# Patient Record
Sex: Male | Born: 1950
Health system: Southern US, Community
[De-identification: ages and names within clinical notes are randomized; demographics above are authoritative.]

## PROBLEM LIST (undated history)

## (undated) ENCOUNTER — Ambulatory Visit: Payer: MEDICARE

## (undated) ENCOUNTER — Ambulatory Visit

## (undated) ENCOUNTER — Encounter

## (undated) ENCOUNTER — Telehealth: Attending: Hematology & Oncology | Primary: Hematology & Oncology

## (undated) ENCOUNTER — Ambulatory Visit: Payer: MEDICARE | Attending: Hematology & Oncology | Primary: Hematology & Oncology

## (undated) ENCOUNTER — Encounter: Attending: Hematology & Oncology | Primary: Hematology & Oncology

## (undated) ENCOUNTER — Telehealth
Attending: Student in an Organized Health Care Education/Training Program | Primary: Student in an Organized Health Care Education/Training Program

## (undated) ENCOUNTER — Telehealth

## (undated) ENCOUNTER — Ambulatory Visit
Payer: MEDICARE | Attending: Rehabilitative and Restorative Service Providers" | Primary: Rehabilitative and Restorative Service Providers"

## (undated) ENCOUNTER — Ambulatory Visit: Payer: MEDICARE | Attending: Diagnostic Radiology | Primary: Diagnostic Radiology

## (undated) ENCOUNTER — Ambulatory Visit
Payer: MEDICARE | Attending: Student in an Organized Health Care Education/Training Program | Primary: Student in an Organized Health Care Education/Training Program

## (undated) ENCOUNTER — Encounter: Attending: Radiation Oncology | Primary: Radiation Oncology

## (undated) DIAGNOSIS — Z9181 History of falling: Secondary | ICD-10-CM

## (undated) DIAGNOSIS — C61 Malignant neoplasm of prostate: Secondary | ICD-10-CM

## (undated) DIAGNOSIS — I10 Essential (primary) hypertension: Secondary | ICD-10-CM

## (undated) DIAGNOSIS — F329 Major depressive disorder, single episode, unspecified: Secondary | ICD-10-CM

## (undated) DIAGNOSIS — F32A Depression, unspecified: Secondary | ICD-10-CM

## (undated) HISTORY — DX: Malignant neoplasm of prostate: C61

## (undated) HISTORY — DX: Essential (primary) hypertension: I10

---

## 1996-07-23 HISTORY — PX: OTHER SURGICAL HISTORY: SHX169

## 1999-07-24 HISTORY — PX: BACK SURGERY: SHX140

## 2013-10-28 ENCOUNTER — Encounter: Payer: Self-pay | Admitting: Internal Medicine

## 2013-12-21 ENCOUNTER — Ambulatory Visit: Payer: Self-pay | Admitting: Internal Medicine

## 2017-05-29 ENCOUNTER — Other Ambulatory Visit: Payer: Self-pay

## 2017-05-29 DIAGNOSIS — F17200 Nicotine dependence, unspecified, uncomplicated: Secondary | ICD-10-CM | POA: Insufficient documentation

## 2017-05-29 DIAGNOSIS — I10 Essential (primary) hypertension: Secondary | ICD-10-CM | POA: Insufficient documentation

## 2017-05-29 DIAGNOSIS — M545 Low back pain: Secondary | ICD-10-CM | POA: Insufficient documentation

## 2017-05-29 DIAGNOSIS — N390 Urinary tract infection, site not specified: Secondary | ICD-10-CM | POA: Insufficient documentation

## 2017-05-29 DIAGNOSIS — R42 Dizziness and giddiness: Secondary | ICD-10-CM | POA: Insufficient documentation

## 2017-05-29 LAB — URINALYSIS, COMPLETE (UACMP) WITH MICROSCOPIC
BACTERIA UA: NONE SEEN
Bilirubin Urine: NEGATIVE
Glucose, UA: NEGATIVE mg/dL
KETONES UR: NEGATIVE mg/dL
Nitrite: NEGATIVE
PH: 5 (ref 5.0–8.0)
PROTEIN: 30 mg/dL — AB
Specific Gravity, Urine: 1.02 (ref 1.005–1.030)

## 2017-05-29 LAB — CBC
HEMATOCRIT: 40.7 % (ref 40.0–52.0)
HEMOGLOBIN: 13.6 g/dL (ref 13.0–18.0)
MCH: 33.2 pg (ref 26.0–34.0)
MCHC: 33.5 g/dL (ref 32.0–36.0)
MCV: 98.8 fL (ref 80.0–100.0)
Platelets: 150 10*3/uL (ref 150–440)
RBC: 4.11 MIL/uL — AB (ref 4.40–5.90)
RDW: 14.1 % (ref 11.5–14.5)
WBC: 6 10*3/uL (ref 3.8–10.6)

## 2017-05-29 LAB — BASIC METABOLIC PANEL
Anion gap: 10 (ref 5–15)
BUN: 25 mg/dL — ABNORMAL HIGH (ref 6–20)
CALCIUM: 8.9 mg/dL (ref 8.9–10.3)
CHLORIDE: 105 mmol/L (ref 101–111)
CO2: 24 mmol/L (ref 22–32)
Creatinine, Ser: 1.28 mg/dL — ABNORMAL HIGH (ref 0.61–1.24)
GFR calc non Af Amer: 58 mL/min — ABNORMAL LOW (ref >60)
GLUCOSE: 98 mg/dL (ref 65–99)
POTASSIUM: 3.9 mmol/L (ref 3.5–5.1)
Sodium: 139 mmol/L (ref 135–145)

## 2017-05-29 LAB — TROPONIN I: Troponin I: <0.03 ng/mL (ref <0.03)

## 2017-05-29 LAB — GLUCOSE, CAPILLARY: GLUCOSE-CAPILLARY: 91 mg/dL (ref 65–99)

## 2017-05-29 NOTE — ED Triage Notes (Signed)
Pt presents to ED via POV with multiple medical c/o's. Pt reports dizziness "for the last 2 or 3 weeks", excessive nocturia, and issues with chronic back pain. Pt denies N/V/D or fever, denies CP or SHOB. Pt reports using "alcohol to self-medicate". Pt denies unilateral weakness; no facial droop noted. Pt is A&O, in NAD; RR even, regular, and unlabored; skin color/temp is WNL.

## 2017-05-30 ENCOUNTER — Emergency Department
Admission: EM | Admit: 2017-05-30 | Discharge: 2017-05-30 | Disposition: A | Discharge status: Home / Self Care | Payer: Self-pay | Attending: Emergency Medicine | Admitting: Emergency Medicine

## 2017-05-30 ENCOUNTER — Emergency Department: Payer: Self-pay

## 2017-05-30 DIAGNOSIS — R42 Dizziness and giddiness: Secondary | ICD-10-CM

## 2017-05-30 DIAGNOSIS — I10 Essential (primary) hypertension: Secondary | ICD-10-CM

## 2017-05-30 DIAGNOSIS — N390 Urinary tract infection, site not specified: Secondary | ICD-10-CM

## 2017-05-30 LAB — SALICYLATE LEVEL: Salicylate Lvl: <7 mg/dL (ref 2.8–30.0)

## 2017-05-30 LAB — ETHANOL

## 2017-05-30 LAB — ACETAMINOPHEN LEVEL: Acetaminophen (Tylenol), Serum: <10 ug/mL — ABNORMAL LOW (ref 10–30)

## 2017-05-30 IMAGING — CT CT HEAD W/O CM
3 series · 16 of 44 positions shown, 19 images · non-contrast
Comparison: None.

CLINICAL DATA: Dizziness

EXAM:
CT HEAD WITHOUT CONTRAST
TECHNIQUE: Contiguous axial images were obtained from the base of the skull
through the vertex without intravenous contrast.

[Series 3: head wo · axial · 0.40mm/px · z∈[-73,+37]mm · 10 of 27 slices shown, 13 images]
[im 3/27  brain]
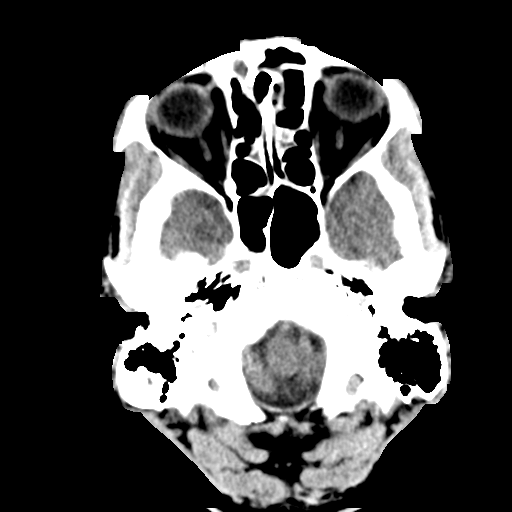
[im 3/27  bone]
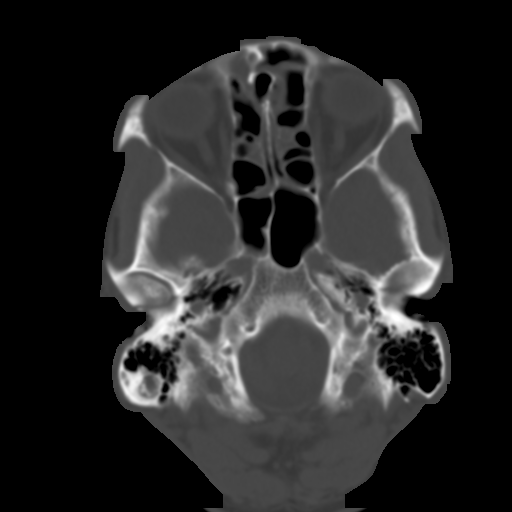
[im 5/27  brain]
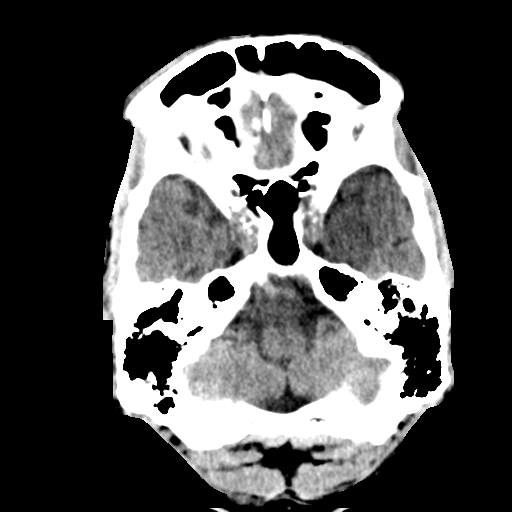
[im 8/27  brain]
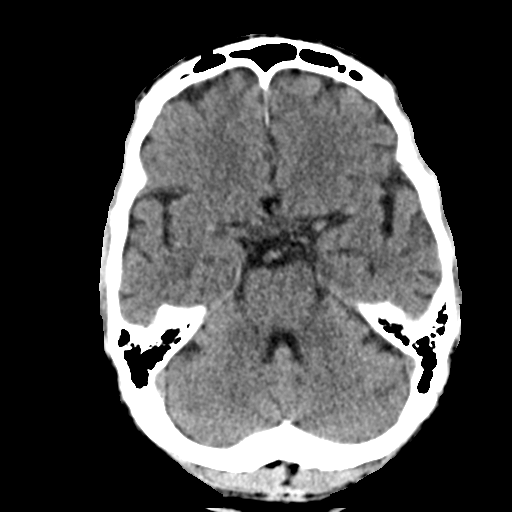
[im 10/27  brain]
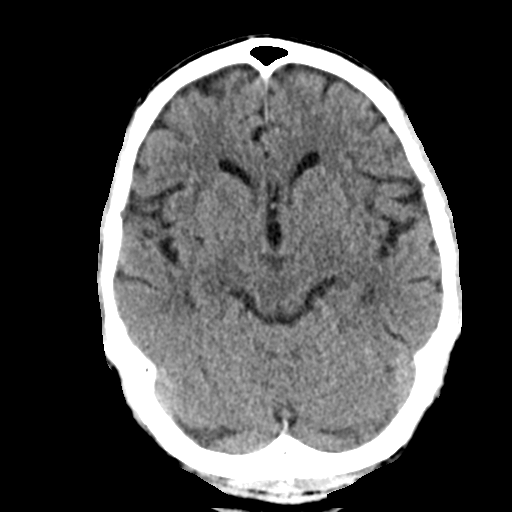
[im 13/27  brain]
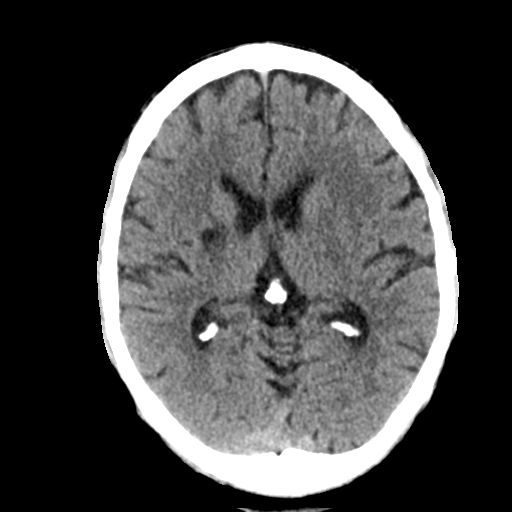
[im 13/27  bone]
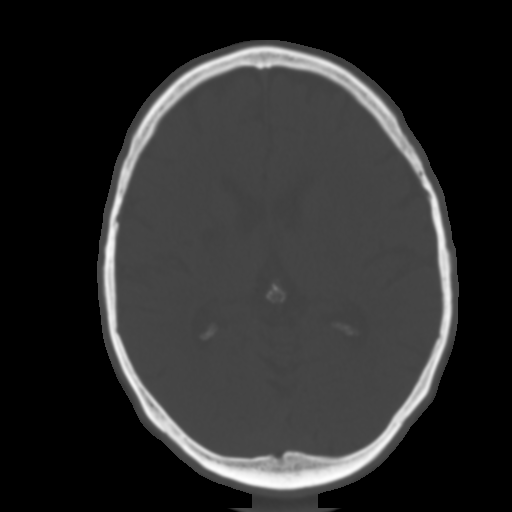
[im 15/27  brain]
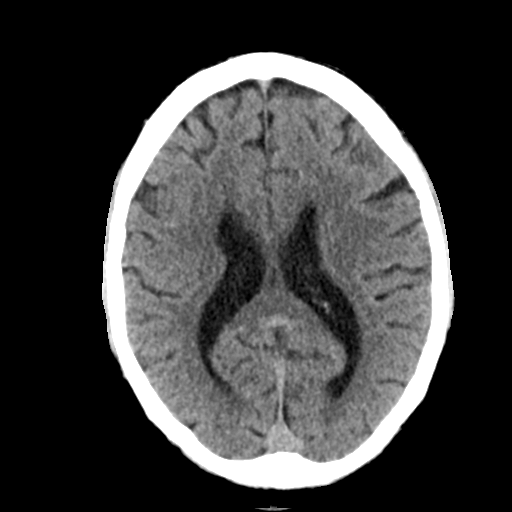
[im 18/27  brain]
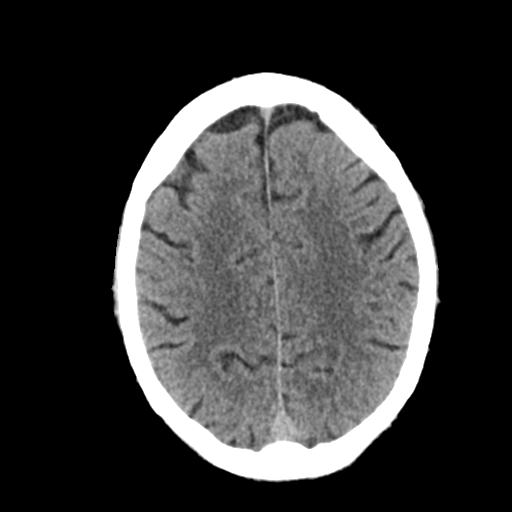
[im 20/27  brain]
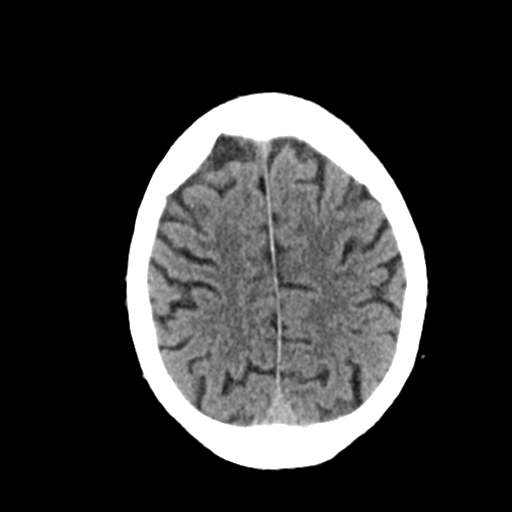
[im 23/27  brain]
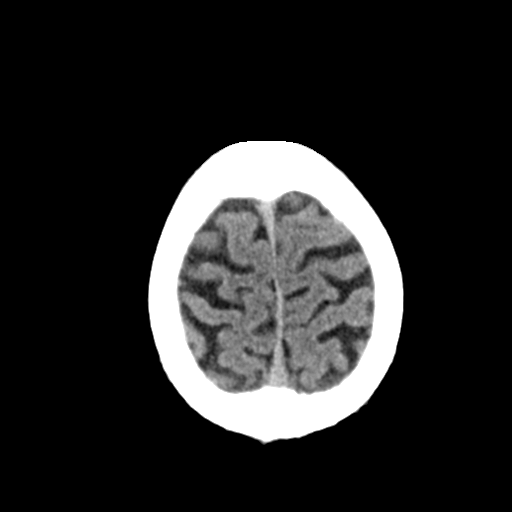
[im 23/27  bone]
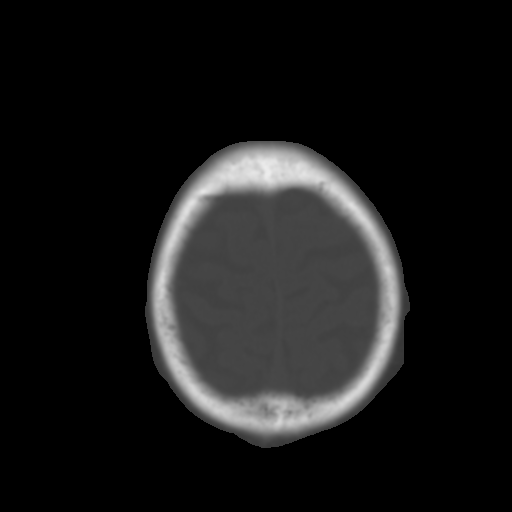
[im 25/27  brain]
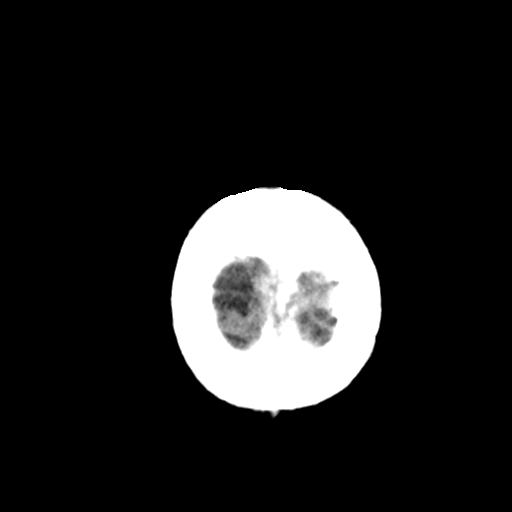

[Series 4: coronal soft tissue · coronal · 0.30mm/px · 3 of 67 slices shown]
[im 23/67  brain]
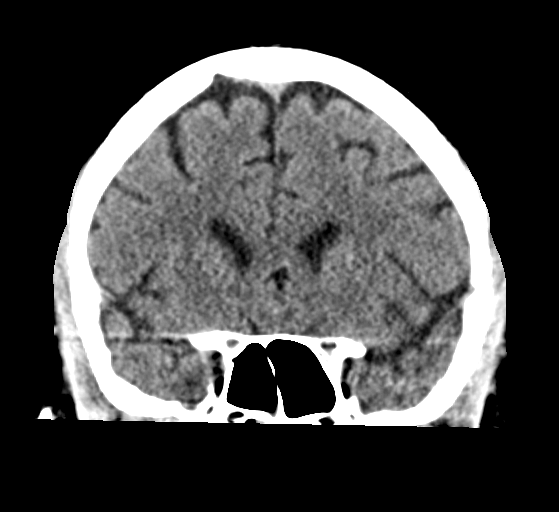
[im 30/67  brain]
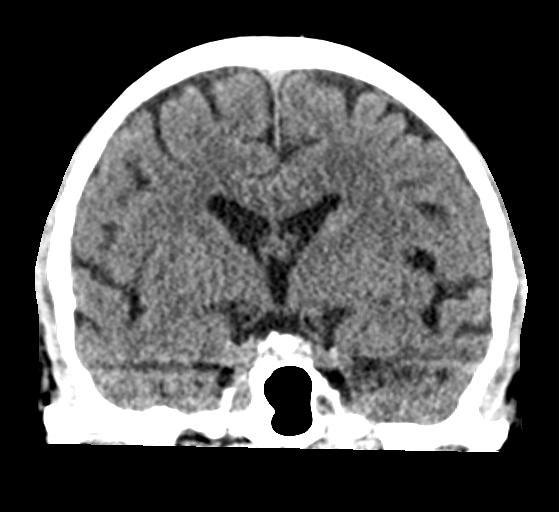
[im 37/67  brain]
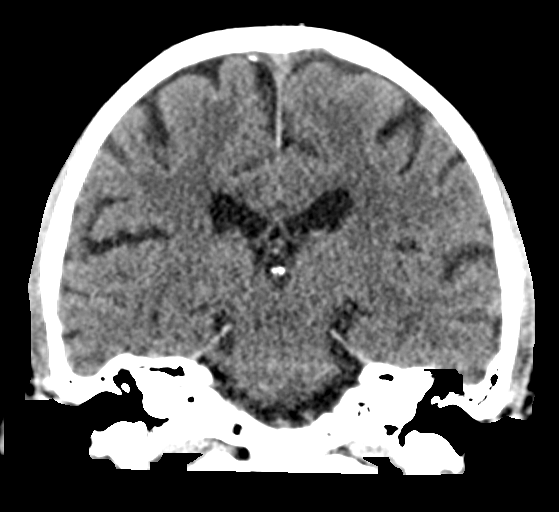

[Series 5: sagittal soft tissue · sagittal · 0.30mm/px · 3 of 51 slices shown]
[im 17/51  brain]
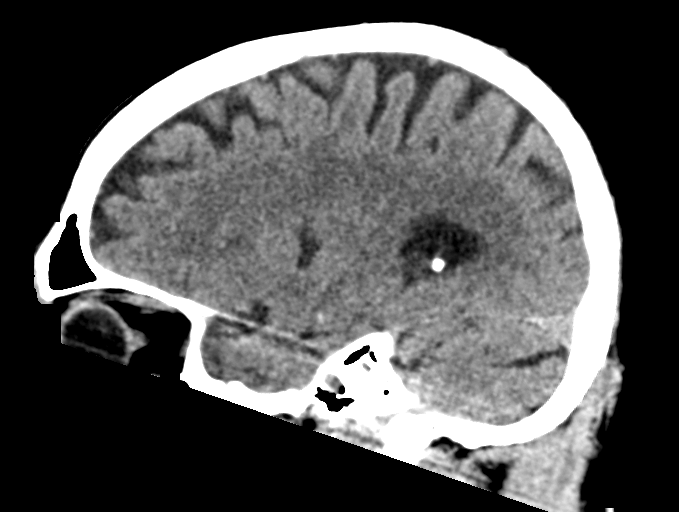
[im 26/51  brain]
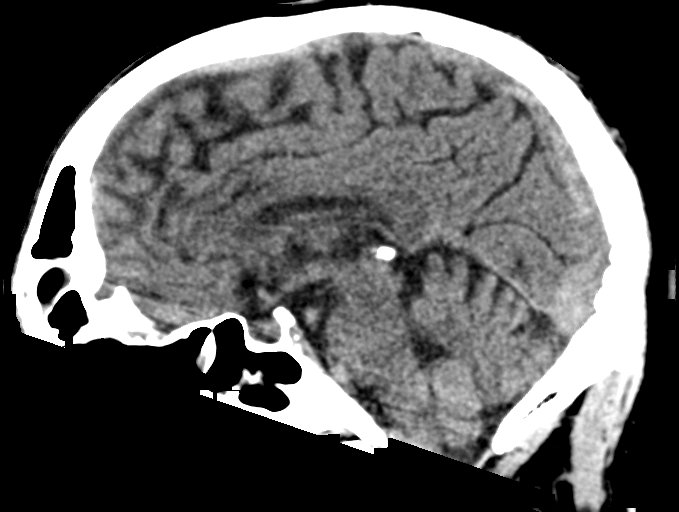
[im 34/51  brain]
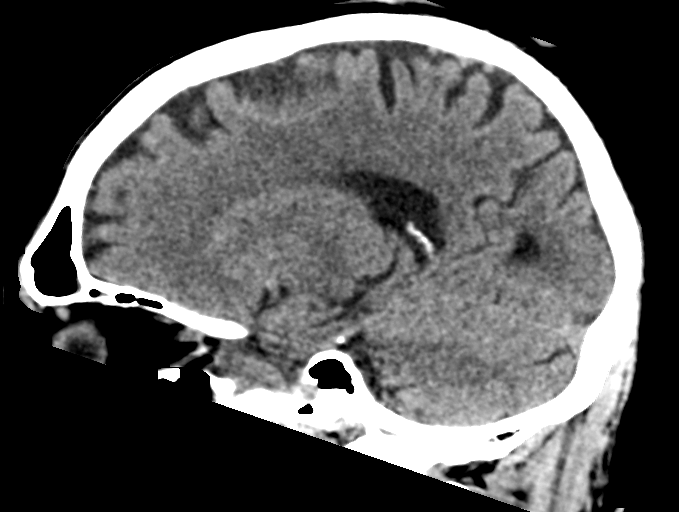

[16 of 44 positions shown; findings below may reference images not displayed]

FINDINGS: Brain: No acute territorial infarction, hemorrhage or intracranial
mass is visualized. Old right basal ganglia infarct. Normal
ventricle size

Vascular: No hyperdense vessels. Scattered calcifications at the
carotid siphon

Skull: No fracture

Sinuses/Orbits: Mucosal opacification of the ethmoid and frontal
sinuses. No acute orbital abnormality

Other: None
IMPRESSION: 1. No CT evidence for acute intracranial abnormality.
2. Sinus disease

## 2017-05-30 MED ORDER — HYDROCHLOROTHIAZIDE 25 MG PO TABS: 25.0000 mg | ORAL_TABLET | Freq: Every day | ORAL | 0 refills | Status: DC

## 2017-05-30 MED ORDER — MECLIZINE HCL 25 MG PO TABS: 25.0000 mg | ORAL_TABLET | Freq: Three times a day (TID) | ORAL | 0 refills | Status: DC | PRN

## 2017-05-30 MED ORDER — CIPROFLOXACIN HCL 500 MG PO TABS: 500.0000 mg | ORAL_TABLET | Freq: Two times a day (BID) | ORAL | 0 refills | Status: DC

## 2017-05-30 MED ORDER — CIPROFLOXACIN HCL 500 MG PO TABS
500.0000 mg | ORAL_TABLET | Freq: Once | ORAL | Status: AC
  Administered 2017-05-30: 500 mg via ORAL
  Filled 2017-05-30: qty 1

## 2017-05-30 MED ORDER — ONDANSETRON 4 MG PO TBDP: 4.0000 mg | ORAL_TABLET | Freq: Three times a day (TID) | ORAL | 0 refills | Status: DC | PRN

## 2017-05-30 MED ORDER — ONDANSETRON 4 MG PO TBDP
4.0000 mg | ORAL_TABLET | Freq: Once | ORAL | Status: AC
  Administered 2017-05-30: 4 mg via ORAL
  Filled 2017-05-30: qty 1

## 2017-05-30 MED ORDER — MECLIZINE HCL 25 MG PO TABS
25.0000 mg | ORAL_TABLET | Freq: Once | ORAL | Status: AC
  Administered 2017-05-30: 25 mg via ORAL
  Filled 2017-05-30: qty 1

## 2017-05-30 NOTE — Discharge Instructions (Signed)
1.  Take antibiotic as prescribed (Cipro 500 mg twice daily x 7 days). 2.  You may take medicines as needed for dizziness and nausea (Meclizine/Zofran #20). 3.  Return to the ER for worsening symptoms, persistent vomiting, difficulty breathing or other concerns.

## 2017-05-30 NOTE — ED Notes (Signed)
Patient transported to CT 

## 2017-05-30 NOTE — ED Provider Notes (Signed)
[facility] Emergency Department Provider Note   ____________________________________________   First MD Initiated Contact with Patient 05/30/17 0129     (approximate)  I have reviewed the triage vital signs and the nursing notes.   HISTORY  Chief Complaint Dizziness and Back Pain    HPI Terry Mitchell is a 66 y.o. male who presents to the ED from home with a chief complaint of dizziness and low back pain.  Patient reports dizziness for the past 2 or 3 weeks.  Describes a sensation of room spinning, worsened on movement or head positioning.  Denies associated headache, vision changes, lightheaded feeling, chest pain, shortness of breath, nausea or vomiting.  Denies slurred speech, extremity weakness, facial droop.  States he hit his top of his head on a dumpster last week.  He did not suffer LOC.  Denies daily drinking but states he drinks often.  Also complains of nocturia and chronic back pain.  Denies recent travel.   Past medical history None  There are no active problems to display for this patient.   Past Surgical History:  Procedure Laterality Date  . BACK SURGERY      Prior to Admission medications   Not on File    Allergies Penicillins  No family history on file.  Social History Social History   Tobacco Use  . Smoking status: Current Every Day Smoker  . Smokeless tobacco: Never Used  Substance Use Topics  . Alcohol use: Yes  . Drug use: No    Review of Systems  Constitutional: No fever/chills. Eyes: No visual changes. ENT: No sore throat. Cardiovascular: Denies chest pain. Respiratory: Denies shortness of breath. Gastrointestinal: No abdominal pain.  No nausea, no vomiting.  No diarrhea.  No constipation. Genitourinary: Positive for nocturia.  Negative for dysuria. Musculoskeletal: Positive for back pain. Skin: Negative for rash. Neurological: Positive for dizziness.  Negative for headaches, focal weakness or  numbness.   ____________________________________________   PHYSICAL EXAM:  VITAL SIGNS: ED Triage Vitals  Enc Vitals Group     BP 05/29/17 2100 (!) 142/72     Pulse Rate 05/29/17 2100 66     Resp 05/29/17 2100 17     Temp 05/29/17 2100 99 F (37.2 C)     Temp Source 05/29/17 2100 Oral     SpO2 05/29/17 2100 96 %     Weight 05/29/17 2059 175 lb (79.4 kg)     Height 05/29/17 2059 6' (1.829 m)     Head Circumference --      Peak Flow --      Pain Score 05/29/17 2058 5     Pain Loc --      Pain Edu? --      Excl. in [place]? --     Constitutional: Alert and oriented. Well appearing and in no acute distress. Eyes: Conjunctivae are normal. PERRL. EOMI. Head: Atraumatic. Ears: Mild cerumen bilaterally, otherwise within normal limits. Nose: No congestion/rhinnorhea. Mouth/Throat: Mucous membranes are moist.  Oropharynx non-erythematous. Neck: No stridor.  No carotid bruits. Cardiovascular: Normal rate, regular rhythm. Grossly normal heart sounds.  Good peripheral circulation. Respiratory: Normal respiratory effort.  No retractions. Lungs CTAB. Gastrointestinal: Soft and nontender. No distention. No abdominal bruits. No CVA tenderness. Musculoskeletal: No lower extremity tenderness nor edema.  No joint effusions. Neurologic:  Normal speech and language. No gross focal neurologic deficits are appreciated. No gait instability. Skin:  Skin is warm, dry and intact. No rash noted. Psychiatric: Mood and affect are  normal. Speech and behavior are normal.  ____________________________________________   LABS (all labs ordered are listed, but only abnormal results are displayed)  Labs Reviewed  BASIC METABOLIC PANEL - Abnormal; Notable for the following components:      Result Value   BUN 25 (*)    Creatinine, Ser 1.28 (*)    GFR calc non Af Amer 58 (*)    All other components within normal limits  CBC - Abnormal; Notable for the following components:   RBC 4.11 (*)    All other  components within normal limits  URINALYSIS, COMPLETE (UACMP) WITH MICROSCOPIC - Abnormal; Notable for the following components:   Color, Urine YELLOW (*)    APPearance CLEAR (*)    Hgb urine dipstick SMALL (*)    Protein, ur 30 (*)    Leukocytes, UA TRACE (*)    Squamous Epithelial / LPF 0-5 (*)    All other components within normal limits  ACETAMINOPHEN LEVEL - Abnormal; Notable for the following components:   Acetaminophen (Tylenol), Serum <10 (*)    All other components within normal limits  TROPONIN I  ETHANOL  GLUCOSE, CAPILLARY  SALICYLATE LEVEL  CBG MONITORING, ED   ____________________________________________  EKG  ED ECG REPORT I, Diannah Rindfleisch J, the attending physician, personally viewed and interpreted this ECG.   Date: 05/30/2017  EKG Time: 2105  Rate: 61  Rhythm: normal EKG, normal sinus rhythm  Axis: Normal  Intervals:none  ST&T Change: Nonspecific  ____________________________________________  RADIOLOGY  Ct Head Wo Contrast  Result Date: 05/30/2017 CLINICAL DATA:  Dizziness EXAM: CT HEAD WITHOUT CONTRAST TECHNIQUE: Contiguous axial images were obtained from the base of the skull through the vertex without intravenous contrast. COMPARISON:  None. FINDINGS: Brain: No acute territorial infarction, hemorrhage or intracranial mass is visualized. Old right basal ganglia infarct. Normal ventricle size Vascular: No hyperdense vessels. Scattered calcifications at the carotid siphon Skull: No fracture Sinuses/Orbits: Mucosal opacification of the ethmoid and frontal sinuses. No acute orbital abnormality Other: None IMPRESSION: 1. No CT evidence for acute intracranial abnormality. 2. Sinus disease Electronically Signed   By: Donavan Foil M.D.   On: 05/30/2017 02:22    ____________________________________________   PROCEDURES  Procedure(s) performed: None  Procedures  Critical Care performed: No  ____________________________________________   INITIAL  IMPRESSION / ASSESSMENT AND PLAN / ED COURSE  As part of my medical decision making, I reviewed the following data within the [place] notes reviewed and incorporated, Labs reviewed, EKG interpreted, Radiograph reviewed  and Notes from prior ED visits.   66 year old male with no past medical history who presents with 2-3 weeks history of dizziness.  No focal neurological deficits on examination.  Differential diagnosis includes but is not limited to vertigo, Mnire's disease, TIA, CVA, encephalopathy, alcohol induced Warnicke syndrome.  Laboratory and urinalysis results remarkable for mild renal insufficiency, UTI.  Pending results of EtOH, acetaminophen and salicylate.  Will obtain CT head.  Administer meclizine and Zofran; will reassess.  Clinical Course as of May 30 345  Thu May 30, 2017  0343 Patient resting no acute distress.  Overall improved.  Updated patient of CT results.  Will place on Cipro for UTI, meclizine and Zofran to use as needed.  Will start low-dose hctz and patient will follow up closely with his PCP.  Strict return precautions given.  Patient verbalizes understanding and agrees with plan of care.  [JS]    Clinical Course User Index [JS] Paulette Blanch, MD  ____________________________________________   FINAL CLINICAL IMPRESSION(S) / ED DIAGNOSES  Final diagnoses:  Dizziness  Lower urinary tract infectious disease  Essential hypertension     ED Discharge Orders    None       Note:  This document was prepared using Dragon voice recognition software and may include unintentional dictation errors.    Paulette Blanch, MD 05/30/17 [phone]

## 2017-12-21 DIAGNOSIS — C61 Malignant neoplasm of prostate: Secondary | ICD-10-CM

## 2017-12-21 HISTORY — DX: Malignant neoplasm of prostate: C61

## 2017-12-31 NOTE — Progress Notes (Signed)
01/01/2018 3:07 PM   Terry Mitchell 22, 1952 [id number]  Referring provider: No referring provider defined for this encounter.  Chief Complaint  Patient presents with  . Elevated PSA    New Patient    HPI: Patient is a 67 year old African American male who is referred to [place] by Dr. Kym Mitchell for a PSA of 72.  He is having frequency, urgency, nocturia, incontinence, intermittency and a weak urinary stream.  He is also having ED.  Patient denies any gross hematuria, dysuria or suprapubic/flank pain.  Patient denies any fevers, chills, nausea or vomiting.   He states that he has had no prior PSAs or DRE's.  He has not seen an urologist in the past.  His father and uncles died when they were young.  He states he has no brothers.  He states a first cousin was diagnosed with prostate cancer at age 53.  His UA today is negative.  His PVR is 0 mL.     PMH: History reviewed. No pertinent past medical history.  Surgical History: Past Surgical History:  Procedure Laterality Date  . BACK SURGERY      Home Medications:  Allergies as of 01/01/2018      Reactions   Penicillins Hives      Medication List        Accurate as of 01/01/18 11:59 PM. Always use your most recent med list.          losartan 100 MG tablet Commonly known as:  COZAAR Take 100 mg by mouth daily.   predniSONE 10 MG tablet Commonly known as:  DELTASONE Take 10 mg by mouth daily with breakfast.       Allergies:  Allergies  Allergen Reactions  . Penicillins Hives    Family History: History reviewed. No pertinent family history.  Social History:  reports that he has been smoking.  He has never used smokeless tobacco. He reports that he drinks alcohol. He reports that he does not use drugs.  ROS: UROLOGY Frequent Urination?: Yes Hard to postpone urination?: Yes Burning/pain with urination?: No Get up at night to urinate?: Yes Leakage of urine?: Yes Urine stream starts and stops?:  Yes Trouble starting stream?: No Do you have to strain to urinate?: No Blood in urine?: No Urinary tract infection?: No Sexually transmitted disease?: No Injury to kidneys or bladder?: No Painful intercourse?: No Weak stream?: No Erection problems?: Yes Penile pain?: No  Gastrointestinal Nausea?: No Vomiting?: No Indigestion/heartburn?: Yes Diarrhea?: Yes Constipation?: No  Constitutional Fever: No Night sweats?: Yes Weight loss?: No Fatigue?: No  Skin Skin rash/lesions?: No Itching?: No  Eyes Blurred vision?: Yes Double vision?: No  Ears/Nose/Throat Sore throat?: No Sinus problems?: Yes  Hematologic/Lymphatic Swollen glands?: No Easy bruising?: No  Cardiovascular Leg swelling?: Yes Chest pain?: Yes  Respiratory Cough?: Yes Shortness of breath?: No  Endocrine Excessive thirst?: No  Musculoskeletal Back pain?: Yes Joint pain?: Yes  Neurological Headaches?: No Dizziness?: No  Psychologic Depression?: Yes Anxiety?: Yes  Physical Exam: BP (!) 152/79   Pulse 65   Resp 16   Ht 6' (1.829 m)   Wt 192 lb 3.2 oz (87.2 kg)   SpO2 96%   BMI 26.07 kg/m   Constitutional:  Well nourished. Alert and oriented, No acute distress. HEENT: [place] AT, moist mucus membranes.  Trachea midline, no masses. Cardiovascular: No clubbing, cyanosis, or edema. Respiratory: Normal respiratory effort, no increased work of breathing. GI: Abdomen is soft, non tender, non distended, no  abdominal masses. Liver and spleen not palpable.  No hernias appreciated.  Stool sample for occult testing is not indicated.   GU: No CVA tenderness.  No bladder fullness or masses.  Patient with circumcised phallus.   Foreskin easily retracted Urethral meatus is patent.  No penile discharge. No penile lesions or rashes. Scrotum without lesions, cysts, rashes and/or edema.  Testicles are located scrotally bilaterally. No masses are appreciated in the testicles. Left and right epididymis are  normal. Rectal: Patient with  normal sphincter tone. Anus and perineum without scarring or rashes. No rectal masses are appreciated. Prostate is approximately 60 grams, nodular.   Seminal vesicles are normal. Skin: No rashes, bruises or suspicious lesions. Lymph: No cervical or inguinal adenopathy. Neurologic: Grossly intact, no focal deficits, moving all 4 extremities. Psychiatric: Normal mood and affect.  Laboratory Data: Lab Results  Component Value Date   WBC 6.0 05/29/2017   HGB 13.6 05/29/2017   HCT 40.7 05/29/2017   MCV 98.8 05/29/2017   PLT 150 05/29/2017    Lab Results  Component Value Date   CREATININE 1.35 (H) 01/01/2018    No results found for: PSA  No results found for: TESTOSTERONE  No results found for: HGBA1C  No results found for: TSH  No results found for: CHOL, HDL, CHOLHDL, VLDL, LDLCALC  No results found for: AST No results found for: ALT No components found for: ALKALINEPHOPHATASE No components found for: BILIRUBINTOTAL  No results found for: ESTRADIOL  Urinalysis Negative.  See Epic.   I have reviewed the labs.   Pertinent Imaging: Results for Terry, Mitchell (MRN [id number]) as of 01/01/2018 15:44  Ref. Range 01/01/2018 15:18  Scan Result Unknown 0    Assessment & Plan:    1. Clinical prostate cancer I discussed with the patient that PSA is an acronym for  prostate specific antigen,  which is a protein made by the prostate gland and can be detected in the blood stream. I explained to the patient situations that would increase the PSA, such as: a man's age,  BPH, infection, recent intercourse/ejaculation, prostate infarction, recent urethroscopic manipulation (Foley placement/cystoscopy) and prostate cancer.  I explained that the normal range of the PSA is 0.0 - 4.0 and his was 72.   At this time patient has clinical prostate cancer.  We will obtain staging imaging and administer Mills Koller and will get patient scheduled for a staging biopsy  so we can refer onto the cancer center Patient will be schedule for a TRUSPBx of prostate.  The procedure is explained and the risks involved, such as blood in urine, blood in stool, blood in semen, infection, urinary retention, and on rare occasions sepsis and death.  Patient understands the risks as explained to him and he wishes to proceed.   Bone scan and CT scan are ordered  Explained on Firmagon works and the side effects; he is agreeable to start the Mills Koller today is given today Referral to Cancer center  Patient instructed to start calcium and vitamin D  2. LUTS Will continue to monitor - may improve or worsen with prostate cancer treatment  3. ED Will address once prostate cancer is staged and treatment course has been decided    Return for RTC to biopsy .  These notes generated with voice recognition software. I apologize for typographical errors.  Zara Council, PA-C  [facility] [address]  [place], [place] [address] [phone]

## 2018-01-01 ENCOUNTER — Encounter: Payer: Self-pay | Admitting: Urology

## 2018-01-01 ENCOUNTER — Ambulatory Visit (INDEPENDENT_AMBULATORY_CARE_PROVIDER_SITE_OTHER): Payer: Medicare Other | Admitting: Urology

## 2018-01-01 VITALS — BP 152/79 | HR 65 | Resp 16 | Ht 72.0 in | Wt 192.2 lb

## 2018-01-01 DIAGNOSIS — C61 Malignant neoplasm of prostate: Secondary | ICD-10-CM

## 2018-01-01 DIAGNOSIS — R972 Elevated prostate specific antigen [PSA]: Secondary | ICD-10-CM | POA: Diagnosis not present

## 2018-01-01 DIAGNOSIS — N529 Male erectile dysfunction, unspecified: Secondary | ICD-10-CM | POA: Diagnosis not present

## 2018-01-01 DIAGNOSIS — R399 Unspecified symptoms and signs involving the genitourinary system: Secondary | ICD-10-CM

## 2018-01-01 LAB — URINALYSIS, COMPLETE
Bilirubin, UA: NEGATIVE
Glucose, UA: NEGATIVE
Leukocytes, UA: NEGATIVE
Nitrite, UA: NEGATIVE
PH UA: 5 (ref 5.0–7.5)
SPEC GRAV UA: 1.025 (ref 1.005–1.030)
Urobilinogen, Ur: 0.2 mg/dL (ref 0.2–1.0)

## 2018-01-01 LAB — MICROSCOPIC EXAMINATION
RBC MICROSCOPIC, UA: NONE SEEN /HPF (ref 0–2)
WBC UA: NONE SEEN /HPF (ref 0–5)

## 2018-01-01 LAB — BLADDER SCAN AMB NON-IMAGING: Scan Result: 0

## 2018-01-01 MED ORDER — DEGARELIX ACETATE 120 MG ~~LOC~~ SOLR
240.0000 mg | Freq: Once | SUBCUTANEOUS | Status: AC
  Administered 2018-01-01: 240 mg via SUBCUTANEOUS

## 2018-01-01 NOTE — Patient Instructions (Signed)
Your need to start Calcium 600 mg twice daily and Vitamin D 400 mg twice daily to help keep your bones strong

## 2018-01-01 NOTE — Progress Notes (Signed)
Firmagon Sub Q Injection  Due to Prostate Cancer patient is present today for a Firmagon Injection.   Medication: Mills Koller (Degarelix)  Dose: 240mg  Location: right & left  upper abdomen Lot: [id number] Exp: 05/2019  Patient tolerated well, no complications were noted  Performed by: Fonnie Jarvis, CMA and Cammie Mcgee, CMA  Follow up: prostate biopsy

## 2018-01-02 LAB — BUN+CREAT
BUN / CREAT RATIO: 14 (ref 10–24)
BUN: 19 mg/dL (ref 8–27)
CREATININE: 1.35 mg/dL — AB (ref 0.76–1.27)
GFR calc Af Amer: 63 mL/min/{1.73_m2} (ref >59)
GFR calc non Af Amer: 55 mL/min/{1.73_m2} — ABNORMAL LOW (ref >59)

## 2018-01-07 ENCOUNTER — Other Ambulatory Visit: Payer: Self-pay

## 2018-01-07 ENCOUNTER — Inpatient Hospital Stay: Payer: Medicare Other | Attending: Oncology | Admitting: Oncology

## 2018-01-07 ENCOUNTER — Encounter: Payer: Self-pay | Admitting: Oncology

## 2018-01-07 VITALS — BP 164/95 | HR 62 | Temp 96.7°F | Resp 18 | Ht 74.0 in | Wt 186.8 lb

## 2018-01-07 DIAGNOSIS — R972 Elevated prostate specific antigen [PSA]: Secondary | ICD-10-CM | POA: Insufficient documentation

## 2018-01-07 DIAGNOSIS — F17200 Nicotine dependence, unspecified, uncomplicated: Secondary | ICD-10-CM | POA: Insufficient documentation

## 2018-01-07 DIAGNOSIS — I10 Essential (primary) hypertension: Secondary | ICD-10-CM

## 2018-01-07 DIAGNOSIS — Z79899 Other long term (current) drug therapy: Secondary | ICD-10-CM | POA: Diagnosis not present

## 2018-01-07 DIAGNOSIS — F17210 Nicotine dependence, cigarettes, uncomplicated: Secondary | ICD-10-CM | POA: Diagnosis not present

## 2018-01-07 NOTE — Progress Notes (Signed)
Hematology/Oncology Consult note [facility] Telephone:([phone] [phone]   Patient Care Team: [name], MD as PCP - General (Family Medicine)  REFERRING PROVIDER: [facility] CHIEF COMPLAINTS/REASON FOR VISIT:  Evaluation of elevated PSA  HISTORY OF PRESENTING ILLNESS:  Terry Mitchell is a  67 y.o.  male with PMH listed below who was referred to me for evaluation of elevated PSA. Patient was referred by primary care physician to urology due to elevated PSA at the level of 72.  Per note Patient was referred to urology for further management.  Staging imaging has been ordered including abdominal and pelvis CT with contrast and bone scan.  Patient was also given Mills Koller for treatment of clinical prostate cancer.  Patient reports that he was scheduled to return to urologist office for biopsy. Patient denies any dysuria, hematuria, fever or chills.  He lives by himself. Denies any bone pain.  Reports some soreness at the area of [place] shots, as well as hot flushes.  Otherwise no complaints.  Review of Systems  Constitutional: Negative for chills, fever, malaise/fatigue and weight loss.  HENT: Negative for congestion, ear discharge, ear pain, nosebleeds, sinus pain and sore throat.   Eyes: Negative for double vision, photophobia, pain, discharge and redness.  Respiratory: Negative for cough, hemoptysis, sputum production, shortness of breath and wheezing.   Cardiovascular: Negative for chest pain, palpitations, orthopnea, claudication and leg swelling.  Gastrointestinal: Negative for abdominal pain, blood in stool, constipation, diarrhea, heartburn, melena, nausea and vomiting.  Genitourinary: Negative for dysuria, flank pain, frequency and hematuria.  Musculoskeletal: Negative for back pain, myalgias and neck pain.  Skin: Negative for itching and rash.  Neurological: Negative for dizziness, tingling, tremors, focal weakness,  weakness and headaches.  Endo/Heme/Allergies: Negative for environmental allergies. Does not bruise/bleed easily.  Psychiatric/Behavioral: Negative for depression and hallucinations. The patient is not nervous/anxious.     MEDICAL HISTORY:  Past Medical History:  Diagnosis Date  . Hypertension   . Prostate cancer [facility])     SURGICAL HISTORY: Past Surgical History:  Procedure Laterality Date  . BACK SURGERY    . Left shoulder surgery      SOCIAL HISTORY: Social History   Socioeconomic History  . Marital status: Single    Spouse name: Not on file  . Number of children: 4  . Years of education: Not on file  . Highest education level: Not on file  Occupational History  . Occupation: disability  Social Needs  . Financial resource strain: Very hard  . Food insecurity:    Worry: Sometimes true    Inability: Often true  . Transportation needs:    Medical: No    Non-medical: Yes  Tobacco Use  . Smoking status: Current Every Day Smoker  . Smokeless tobacco: Never Used  Substance and Sexual Activity  . Alcohol use: Yes    Alcohol/week: 1.2 oz    Types: 2 Cans of beer per week  . Drug use: No  . Sexual activity: Not Currently  Lifestyle  . Physical activity:    Days per week: 7 days    Minutes per session: 60 min  . Stress: Rather much  Relationships  . Social connections:    Talks on phone: Not on file    Gets together: Not on file    Attends religious service: Not on file    Active member of club or organization: Not on file    Attends meetings of clubs or organizations: Not on  file    Relationship status: Not on file  . Intimate partner violence:    Fear of current or ex partner: Not on file    Emotionally abused: Not on file    Physically abused: Not on file    Forced sexual activity: Not on file  Other Topics Concern  . Not on file  Social History Narrative  . Not on file    FAMILY HISTORY: Family History  Problem Relation Age of Onset  . Stroke  Mother   . Kidney failure Mother   . Diabetes Maternal Aunt     ALLERGIES:  is allergic to penicillins.  MEDICATIONS:  Current Outpatient Medications  Medication Sig Dispense Refill  . losartan (COZAAR) 100 MG tablet Take 100 mg by mouth daily.    . predniSONE (DELTASONE) 10 MG tablet Take 10 mg by mouth daily with breakfast.     No current facility-administered medications for this visit.      PHYSICAL EXAMINATION: ECOG PERFORMANCE STATUS: 0 - Asymptomatic Vitals:   01/07/18 1051  BP: (!) 164/95  Pulse: 62  Resp: 18  Temp: (!) 96.7 F (35.9 C)   Filed Weights   01/07/18 1051  Weight: 186 lb 12.8 oz (84.7 kg)    Physical Exam  Constitutional: He is oriented to person, place, and time. He appears well-developed and well-nourished. No distress.  HENT:  Head: Normocephalic and atraumatic.  Right Ear: External ear normal.  Left Ear: External ear normal.  Mouth/Throat: Oropharynx is clear and moist.  Eyes: Pupils are equal, round, and reactive to light. Conjunctivae and EOM are normal. No scleral icterus.  Neck: Normal range of motion. Neck supple.  Cardiovascular: Normal rate, regular rhythm and normal heart sounds.  Pulmonary/Chest: Effort normal and breath sounds normal. No respiratory distress. He has no wheezes. He has no rales. He exhibits no tenderness.  Abdominal: Soft. Bowel sounds are normal. He exhibits no distension and no mass. There is no tenderness.  Musculoskeletal: Normal range of motion. He exhibits no edema or deformity.  Lymphadenopathy:    He has no cervical adenopathy.  Neurological: He is alert and oriented to person, place, and time. No cranial nerve deficit. Coordination normal.  Skin: Skin is warm and dry. No rash noted.  Psychiatric: He has a normal mood and affect. His behavior is normal. Thought content normal.     LABORATORY DATA:  I have reviewed the data as listed Lab Results  Component Value Date   WBC 6.0 05/29/2017   HGB 13.6  05/29/2017   HCT 40.7 05/29/2017   MCV 98.8 05/29/2017   PLT 150 05/29/2017   Recent Labs    05/29/17 2104 01/01/18 1509  NA 139  --   K 3.9  --   CL 105  --   CO2 24  --   GLUCOSE 98  --   BUN 25* 19  CREATININE 1.28* 1.35*  CALCIUM 8.9  --   GFRNONAA 58* 55*  GFRAA >60 63   Iron/TIBC/Ferritin/ %Sat No results found for: IRON, TIBC, FERRITIN, IRONPCTSAT      ASSESSMENT & PLAN:  1. PSA elevation    Discussed with patient that with the significantly elevated PSA, suspicion of prostate cancer is high. Discussed with patient that I agree with urology for proceeding staging scans and also biopsy to establish cancer diagnosis. He has already been on ADT for treatment. Once cancer diagnosis is established unhappy to see him and discuss about pathology reports, disease extent, and a future  management. Patient has questions and I tried to answer to his satisfaction. I offer to check lab tests including CBC, CMP, PSA.  Patient referred to have blood rechecked when he comes back to follow-up.  Orders Placed This Encounter  Procedures  . CBC with Differential/Platelet    Standing Status:   Future    Standing Expiration Date:   01/08/2019  . Comprehensive metabolic panel    Standing Status:   Future    Standing Expiration Date:   01/08/2019  . PSA    Standing Status:   Future    Standing Expiration Date:   01/08/2019    All questions were answered. The patient knows to call the clinic with any problems questions or concerns.  Return of visit: 4 weeks, after prostate biopsy. Thank you for this kind referral and the opportunity to participate in the care of this patient. A copy of today's note is routed to referring provider  Total face to face encounter time for this patient visit was 40 min. >50% of the time was  spent in counseling and coordination of care.    [name], MD, PhD Hematology Oncology [facility] at [facility] Pager-  [phone] 01/07/2018

## 2018-01-07 NOTE — Progress Notes (Signed)
Patient here today for initial evaluation.  

## 2018-01-20 DIAGNOSIS — Z9181 History of falling: Secondary | ICD-10-CM

## 2018-01-20 HISTORY — DX: History of falling: Z91.81

## 2018-02-03 ENCOUNTER — Other Ambulatory Visit: Payer: Self-pay | Admitting: Urology

## 2018-02-03 ENCOUNTER — Ambulatory Visit (INDEPENDENT_AMBULATORY_CARE_PROVIDER_SITE_OTHER): Payer: Medicare Other | Admitting: Urology

## 2018-02-03 ENCOUNTER — Encounter: Payer: Self-pay | Admitting: Urology

## 2018-02-03 VITALS — BP 163/93 | HR 56 | Ht 72.0 in | Wt 185.6 lb

## 2018-02-03 DIAGNOSIS — R972 Elevated prostate specific antigen [PSA]: Secondary | ICD-10-CM | POA: Diagnosis not present

## 2018-02-03 MED ORDER — LEVOFLOXACIN 500 MG PO TABS
500.0000 mg | ORAL_TABLET | Freq: Once | ORAL | Status: AC
  Administered 2018-02-03: 500 mg via ORAL

## 2018-02-03 MED ORDER — GENTAMICIN SULFATE 40 MG/ML IJ SOLN
80.0000 mg | Freq: Once | INTRAMUSCULAR | Status: AC
  Administered 2018-02-03: 80 mg via INTRAMUSCULAR

## 2018-02-03 NOTE — H&P (View-Only) (Signed)
   02/03/2018 11:12 AM   Terry Mitchell 1951/04/12 [id number]  Referring provider: [name], [place], [place] [address]  Chief Complaint  Patient presents with  . Prostate Biopsy    HPI: 68 year old male with a PSA is 93.  He was scheduled for prostate biopsy today however it was unable to tolerate the preprocedure DRE.  He is scheduled for TRUS/prostate biopsy under sedation.   PMH: Past Medical History:  Diagnosis Date  . Hypertension   . Prostate cancer [facility])     Surgical History: Past Surgical History:  Procedure Laterality Date  . BACK SURGERY    . Left shoulder surgery      Home Medications:  Allergies as of 02/03/2018      Reactions   Penicillins Hives      Medication List        Accurate as of 02/03/18 11:12 AM. Always use your most recent med list.          losartan 100 MG tablet Commonly known as:  COZAAR Take 100 mg by mouth daily.   predniSONE 10 MG tablet Commonly known as:  DELTASONE Take 10 mg by mouth daily with breakfast.       Allergies:  Allergies  Allergen Reactions  . Penicillins Hives    Family History: Family History  Problem Relation Age of Onset  . Stroke Mother   . Kidney failure Mother   . Diabetes Maternal Aunt     Social History:  reports that he has been smoking.  He has never used smokeless tobacco. He reports that he drinks about 1.2 oz of alcohol per week. He reports that he does not use drugs.  ROS: No changes from 01/01/2018.  Physical Exam: BP (!) 163/93   Pulse (!) 56   Ht 6' (1.829 m)   Wt 185 lb 9.6 oz (84.2 kg)   BMI 25.17 kg/m   Constitutional:  Alert and oriented, No acute distress. HEENT: [place] AT, moist mucus membranes.  Trachea midline, no masses. Cardiovascular: No clubbing, cyanosis, or edema.  CV RRR Respiratory: Normal respiratory effort, no increased work of breathing.   Lungs clear GI: Abdomen is soft, nontender, nondistended, no abdominal masses GU: No CVA  tenderness Lymph: No cervical or inguinal lymphadenopathy. Skin: No rashes, bruises or suspicious lesions. Neurologic: Grossly intact, no focal deficits, moving all 4 extremities. Psychiatric: Normal mood and affect.   Assessment & Plan:   67 year old male with a significantly elevated PSA suspicious for prostate cancer.  TRUS/biopsy of prostate will be scheduled in same day surgery under sedation.  The procedure was discussed in detail including potential risks of bleeding and infection/sepsis.   Abbie Sons, [place] [address], [place], [place] [address] [phone]

## 2018-02-03 NOTE — Progress Notes (Signed)
   02/03/2018 11:12 AM   Terry Mitchell 09/04/1950 [id number]  Referring provider: [name], [place], [place] [address]  Chief Complaint  Patient presents with  . Prostate Biopsy    HPI: 67 year old male with a PSA is 58.  He was scheduled for prostate biopsy today however it was unable to tolerate the preprocedure DRE.  He is scheduled for TRUS/prostate biopsy under sedation.   PMH: Past Medical History:  Diagnosis Date  . Hypertension   . Prostate cancer [facility])     Surgical History: Past Surgical History:  Procedure Laterality Date  . BACK SURGERY    . Left shoulder surgery      Home Medications:  Allergies as of 02/03/2018      Reactions   Penicillins Hives      Medication List        Accurate as of 02/03/18 11:12 AM. Always use your most recent med list.          losartan 100 MG tablet Commonly known as:  COZAAR Take 100 mg by mouth daily.   predniSONE 10 MG tablet Commonly known as:  DELTASONE Take 10 mg by mouth daily with breakfast.       Allergies:  Allergies  Allergen Reactions  . Penicillins Hives    Family History: Family History  Problem Relation Age of Onset  . Stroke Mother   . Kidney failure Mother   . Diabetes Maternal Aunt     Social History:  reports that he has been smoking.  He has never used smokeless tobacco. He reports that he drinks about 1.2 oz of alcohol per week. He reports that he does not use drugs.  ROS: No changes from 01/01/2018.  Physical Exam: BP (!) 163/93   Pulse (!) 56   Ht 6' (1.829 m)   Wt 185 lb 9.6 oz (84.2 kg)   BMI 25.17 kg/m   Constitutional:  Alert and oriented, No acute distress. HEENT: [place] AT, moist mucus membranes.  Trachea midline, no masses. Cardiovascular: No clubbing, cyanosis, or edema.  CV RRR Respiratory: Normal respiratory effort, no increased work of breathing.   Lungs clear GI: Abdomen is soft, nontender, nondistended, no abdominal masses GU: No CVA  tenderness Lymph: No cervical or inguinal lymphadenopathy. Skin: No rashes, bruises or suspicious lesions. Neurologic: Grossly intact, no focal deficits, moving all 4 extremities. Psychiatric: Normal mood and affect.   Assessment & Plan:   67 year old male with a significantly elevated PSA suspicious for prostate cancer.  TRUS/biopsy of prostate will be scheduled in same day surgery under sedation.  The procedure was discussed in detail including potential risks of bleeding and infection/sepsis.   Abbie Sons, [place] [address] [place], [place] [address] [phone]

## 2018-02-04 ENCOUNTER — Other Ambulatory Visit: Payer: Self-pay | Admitting: Radiology

## 2018-02-10 ENCOUNTER — Ambulatory Visit: Payer: Medicaid Other | Admitting: Oncology

## 2018-02-10 ENCOUNTER — Other Ambulatory Visit: Payer: Medicaid Other

## 2018-02-11 ENCOUNTER — Encounter
Admission: RE | Admit: 2018-02-11 | Discharge: 2018-02-11 | Disposition: A | Discharge status: Home / Self Care | Payer: Medicare Other | Source: Ambulatory Visit | Attending: Urology | Admitting: Urology

## 2018-02-11 ENCOUNTER — Other Ambulatory Visit: Payer: Self-pay

## 2018-02-11 ENCOUNTER — Other Ambulatory Visit: Payer: Self-pay | Admitting: Radiology

## 2018-02-11 ENCOUNTER — Telehealth: Payer: Self-pay | Admitting: Radiology

## 2018-02-11 DIAGNOSIS — Z01812 Encounter for preprocedural laboratory examination: Secondary | ICD-10-CM | POA: Diagnosis present

## 2018-02-11 DIAGNOSIS — Z01810 Encounter for preprocedural cardiovascular examination: Secondary | ICD-10-CM | POA: Diagnosis not present

## 2018-02-11 DIAGNOSIS — I10 Essential (primary) hypertension: Secondary | ICD-10-CM | POA: Insufficient documentation

## 2018-02-11 DIAGNOSIS — D696 Thrombocytopenia, unspecified: Secondary | ICD-10-CM

## 2018-02-11 HISTORY — DX: Depression, unspecified: F32.A

## 2018-02-11 HISTORY — DX: History of falling: Z91.81

## 2018-02-11 HISTORY — DX: Major depressive disorder, single episode, unspecified: F32.9

## 2018-02-11 LAB — BASIC METABOLIC PANEL
Anion gap: 5 (ref 5–15)
BUN: 37 mg/dL — AB (ref 8–23)
CO2: 28 mmol/L (ref 22–32)
CREATININE: 1.93 mg/dL — AB (ref 0.61–1.24)
Calcium: 9.5 mg/dL (ref 8.9–10.3)
Chloride: 108 mmol/L (ref 98–111)
GFR calc Af Amer: 40 mL/min — ABNORMAL LOW (ref >60)
GFR calc non Af Amer: 35 mL/min — ABNORMAL LOW (ref >60)
Glucose, Bld: 87 mg/dL (ref 70–99)
Potassium: 4.5 mmol/L (ref 3.5–5.1)
SODIUM: 141 mmol/L (ref 135–145)

## 2018-02-11 LAB — CBC
HCT: 43 % (ref 40.0–52.0)
Hemoglobin: 14.4 g/dL (ref 13.0–18.0)
MCH: 33 pg (ref 26.0–34.0)
MCHC: 33.4 g/dL (ref 32.0–36.0)
MCV: 98.8 fL (ref 80.0–100.0)
Platelets: 92 10*3/uL — ABNORMAL LOW (ref 150–440)
RBC: 4.35 MIL/uL — AB (ref 4.40–5.90)
RDW: 13.6 % (ref 11.5–14.5)
WBC: 6.6 10*3/uL (ref 3.8–10.6)

## 2018-02-11 NOTE — Telephone Encounter (Signed)
Referral to Hematology placed per Dr Bernardo Heater for platelet count of 92 on 02/11/2018. Informed patient that surgery will need to be postponed until after hematology evaluation. Tentatively scheduled prostate biopsy for 02/28/2018 pending evaluation. Questions answered. Patient voices understanding.

## 2018-02-11 NOTE — Patient Instructions (Signed)
Your procedure is scheduled on: Friday, February 14, 2018  Report to [facility].    DO NOT STOP ON THE FIRST FLOOR TO REGISTER.  To find out your arrival time please call [phone] between 1PM - 3PM on Thursday, July 25TH  Remember: Instructions that are not followed completely may result in serious medical risk,  up to and including death, or upon the discretion of your surgeon and anesthesiologist your  surgery may need to be rescheduled.     _X__ 1. Do not eat food after midnight the night before your procedure.                 No gum chewing or hard candies. NOTHING SOLID IN YOUR MOUTH AFTER MIDNIGHT                  You may drink clear liquids up to 2 hours before you are scheduled to arrive for your surgery-                  DO not drink clear liquids within 2 hours of the start of your surgery.                  Clear Liquids include:  water, apple juice without pulp, clear carbohydrate                 drink such as Clearfast of Gatorade, Black Coffee or Tea (Do not add                 anything to coffee or tea).  __X__2.  On the morning of surgery brush your teeth with toothpaste and water,                     You may rinse your mouth with mouthwash if you wish.                     Do not swallow any toothpaste of mouthwash.     _X__ 3.  No Alcohol for 24 hours before or after surgery.   _X__ 4.  Do Not Smoke or use e-cigarettes For 24 Hours Prior to Your Surgery.                 Do not use any chewable tobacco products for at least 6 hours prior to                 surgery.  ____  5.  Bring all medications with you on the day of surgery if instructed.   ____  6.  Notify your doctor if there is any change in your medical condition      (cold, fever, infections).     Do not wear jewelry, make-up, hairpins, clips or nail polish. Do not wear lotions, powders, or perfumes. You may wear deodorant. Do not shave 48 hours prior to  surgery. Men may shave face and neck. Do not bring valuables to the hospital.    [facility] is not responsible for any belongings or valuables.  Contacts, dentures or bridgework may not be worn into surgery. Leave your suitcase in the car. After surgery it may be brought to your room. For patients admitted to the hospital, discharge time is determined by your treatment team.   Patients discharged the day of surgery will not be allowed to drive home.   Please read over the following fact sheets that you were  given:   PREPARING FOR SURGERY   ____ Take these medicines the morning of surgery with A SIP OF WATER:    1.NONE  2.   3.   4.  5.  6.  ____ Fleet Enema (as directed)   _X___ Use BATH Soap as directed. SHOWER ON THE MORNING OF SURGERY  ____ Use inhalers on the day of surgery  _X___ Stop ALL ASPIRIN PRODUCTS AS OF TODAY, Tuesday              THIS INCLUDES BC POWDER, GOODIES POWDER, EXCEDRIN  __X__ Stop Anti-inflammatories AS OF TODAY.              THIS INCLUDES IBUPROFEN / MOTRIN / ADVIL / ALEVE   ____ Stop supplements until after surgery.    ____ Bring C-Pap to the hospital.   [place].  USE THE ENEMA ON THE MORNING OF SURGERY

## 2018-02-14 ENCOUNTER — Inpatient Hospital Stay: Admission: RE | Admit: 2018-02-14 | Payer: Medicaid Other | Source: Ambulatory Visit

## 2018-02-17 ENCOUNTER — Ambulatory Visit: Payer: Medicaid Other | Admitting: Urology

## 2018-02-20 ENCOUNTER — Emergency Department: Payer: Medicare Other

## 2018-02-20 ENCOUNTER — Other Ambulatory Visit: Payer: Self-pay

## 2018-02-20 ENCOUNTER — Observation Stay
Admission: EM | Admit: 2018-02-20 | Discharge: 2018-02-21 | Disposition: A | Discharge status: Home / Self Care | Payer: Medicare Other | Attending: Internal Medicine | Admitting: Internal Medicine

## 2018-02-20 DIAGNOSIS — I1310 Hypertensive heart and chronic kidney disease without heart failure, with stage 1 through stage 4 chronic kidney disease, or unspecified chronic kidney disease: Secondary | ICD-10-CM | POA: Diagnosis not present

## 2018-02-20 DIAGNOSIS — N189 Chronic kidney disease, unspecified: Secondary | ICD-10-CM | POA: Insufficient documentation

## 2018-02-20 DIAGNOSIS — F329 Major depressive disorder, single episode, unspecified: Secondary | ICD-10-CM | POA: Diagnosis not present

## 2018-02-20 DIAGNOSIS — Z841 Family history of disorders of kidney and ureter: Secondary | ICD-10-CM | POA: Diagnosis not present

## 2018-02-20 DIAGNOSIS — Z880 Allergy status to penicillin: Secondary | ICD-10-CM | POA: Insufficient documentation

## 2018-02-20 DIAGNOSIS — C61 Malignant neoplasm of prostate: Secondary | ICD-10-CM | POA: Insufficient documentation

## 2018-02-20 DIAGNOSIS — M419 Scoliosis, unspecified: Secondary | ICD-10-CM | POA: Diagnosis not present

## 2018-02-20 DIAGNOSIS — F1010 Alcohol abuse, uncomplicated: Secondary | ICD-10-CM | POA: Insufficient documentation

## 2018-02-20 DIAGNOSIS — F17290 Nicotine dependence, other tobacco product, uncomplicated: Secondary | ICD-10-CM | POA: Diagnosis not present

## 2018-02-20 DIAGNOSIS — R9431 Abnormal electrocardiogram [ECG] [EKG]: Secondary | ICD-10-CM | POA: Diagnosis present

## 2018-02-20 DIAGNOSIS — R079 Chest pain, unspecified: Secondary | ICD-10-CM | POA: Diagnosis present

## 2018-02-20 DIAGNOSIS — Z7982 Long term (current) use of aspirin: Secondary | ICD-10-CM | POA: Insufficient documentation

## 2018-02-20 DIAGNOSIS — Z8673 Personal history of transient ischemic attack (TIA), and cerebral infarction without residual deficits: Secondary | ICD-10-CM | POA: Insufficient documentation

## 2018-02-20 DIAGNOSIS — E739 Lactose intolerance, unspecified: Secondary | ICD-10-CM | POA: Diagnosis not present

## 2018-02-20 DIAGNOSIS — M4802 Spinal stenosis, cervical region: Secondary | ICD-10-CM | POA: Insufficient documentation

## 2018-02-20 DIAGNOSIS — R55 Syncope and collapse: Principal | ICD-10-CM | POA: Insufficient documentation

## 2018-02-20 DIAGNOSIS — Z833 Family history of diabetes mellitus: Secondary | ICD-10-CM | POA: Insufficient documentation

## 2018-02-20 DIAGNOSIS — Z823 Family history of stroke: Secondary | ICD-10-CM | POA: Insufficient documentation

## 2018-02-20 DIAGNOSIS — M545 Low back pain: Secondary | ICD-10-CM | POA: Insufficient documentation

## 2018-02-20 DIAGNOSIS — Z79899 Other long term (current) drug therapy: Secondary | ICD-10-CM | POA: Insufficient documentation

## 2018-02-20 DIAGNOSIS — R0789 Other chest pain: Secondary | ICD-10-CM | POA: Diagnosis not present

## 2018-02-20 LAB — TROPONIN I: Troponin I: <0.03 ng/mL (ref <0.03)

## 2018-02-20 LAB — CBC WITH DIFFERENTIAL/PLATELET
Basophils Absolute: 0 10*3/uL (ref 0–0.1)
Basophils Relative: 0 %
EOS ABS: 0.1 10*3/uL (ref 0–0.7)
Eosinophils Relative: 2 %
HCT: 42.5 % (ref 40.0–52.0)
Hemoglobin: 14.3 g/dL (ref 13.0–18.0)
LYMPHS PCT: 26 %
Lymphs Abs: 2.2 10*3/uL (ref 1.0–3.6)
MCH: 33.6 pg (ref 26.0–34.0)
MCHC: 33.8 g/dL (ref 32.0–36.0)
MCV: 99.6 fL (ref 80.0–100.0)
MONO ABS: 0.5 10*3/uL (ref 0.2–1.0)
Monocytes Relative: 7 %
Neutro Abs: 5.4 10*3/uL (ref 1.4–6.5)
Neutrophils Relative %: 65 %
Platelets: 121 10*3/uL — ABNORMAL LOW (ref 150–440)
RBC: 4.26 MIL/uL — AB (ref 4.40–5.90)
RDW: 13.8 % (ref 11.5–14.5)
WBC: 8.3 10*3/uL (ref 3.8–10.6)

## 2018-02-20 LAB — COMPREHENSIVE METABOLIC PANEL
ALK PHOS: 54 U/L (ref 38–126)
ALT: 30 U/L (ref 0–44)
AST: 51 U/L — AB (ref 15–41)
Albumin: 3.2 g/dL — ABNORMAL LOW (ref 3.5–5.0)
Anion gap: 10 (ref 5–15)
BILIRUBIN TOTAL: 0.5 mg/dL (ref 0.3–1.2)
BUN: 28 mg/dL — AB (ref 8–23)
CALCIUM: 8.6 mg/dL — AB (ref 8.9–10.3)
CO2: 22 mmol/L (ref 22–32)
Chloride: 112 mmol/L — ABNORMAL HIGH (ref 98–111)
Creatinine, Ser: 1.67 mg/dL — ABNORMAL HIGH (ref 0.61–1.24)
GFR, EST AFRICAN AMERICAN: 48 mL/min — AB (ref >60)
GFR, EST NON AFRICAN AMERICAN: 41 mL/min — AB (ref >60)
Glucose, Bld: 111 mg/dL — ABNORMAL HIGH (ref 70–99)
Potassium: 3.5 mmol/L (ref 3.5–5.1)
Sodium: 144 mmol/L (ref 135–145)
Total Protein: 5.4 g/dL — ABNORMAL LOW (ref 6.5–8.1)

## 2018-02-20 LAB — ETHANOL: ALCOHOL ETHYL (B): 66 mg/dL — AB (ref <10)

## 2018-02-20 IMAGING — DX DG CHEST 1V PORT
1 series · 1 of 1 positions shown · non-contrast
Comparison: Portable exam [NW] hours without priors for comparison

CLINICAL DATA: Syncopal episode this morning sitting on a porch
ledge, LEFT rib pain, donating plasma this morning, hypertension,
prostate cancer, smoker

EXAM:
PORTABLE CHEST 1 VIEW

[chest ap]
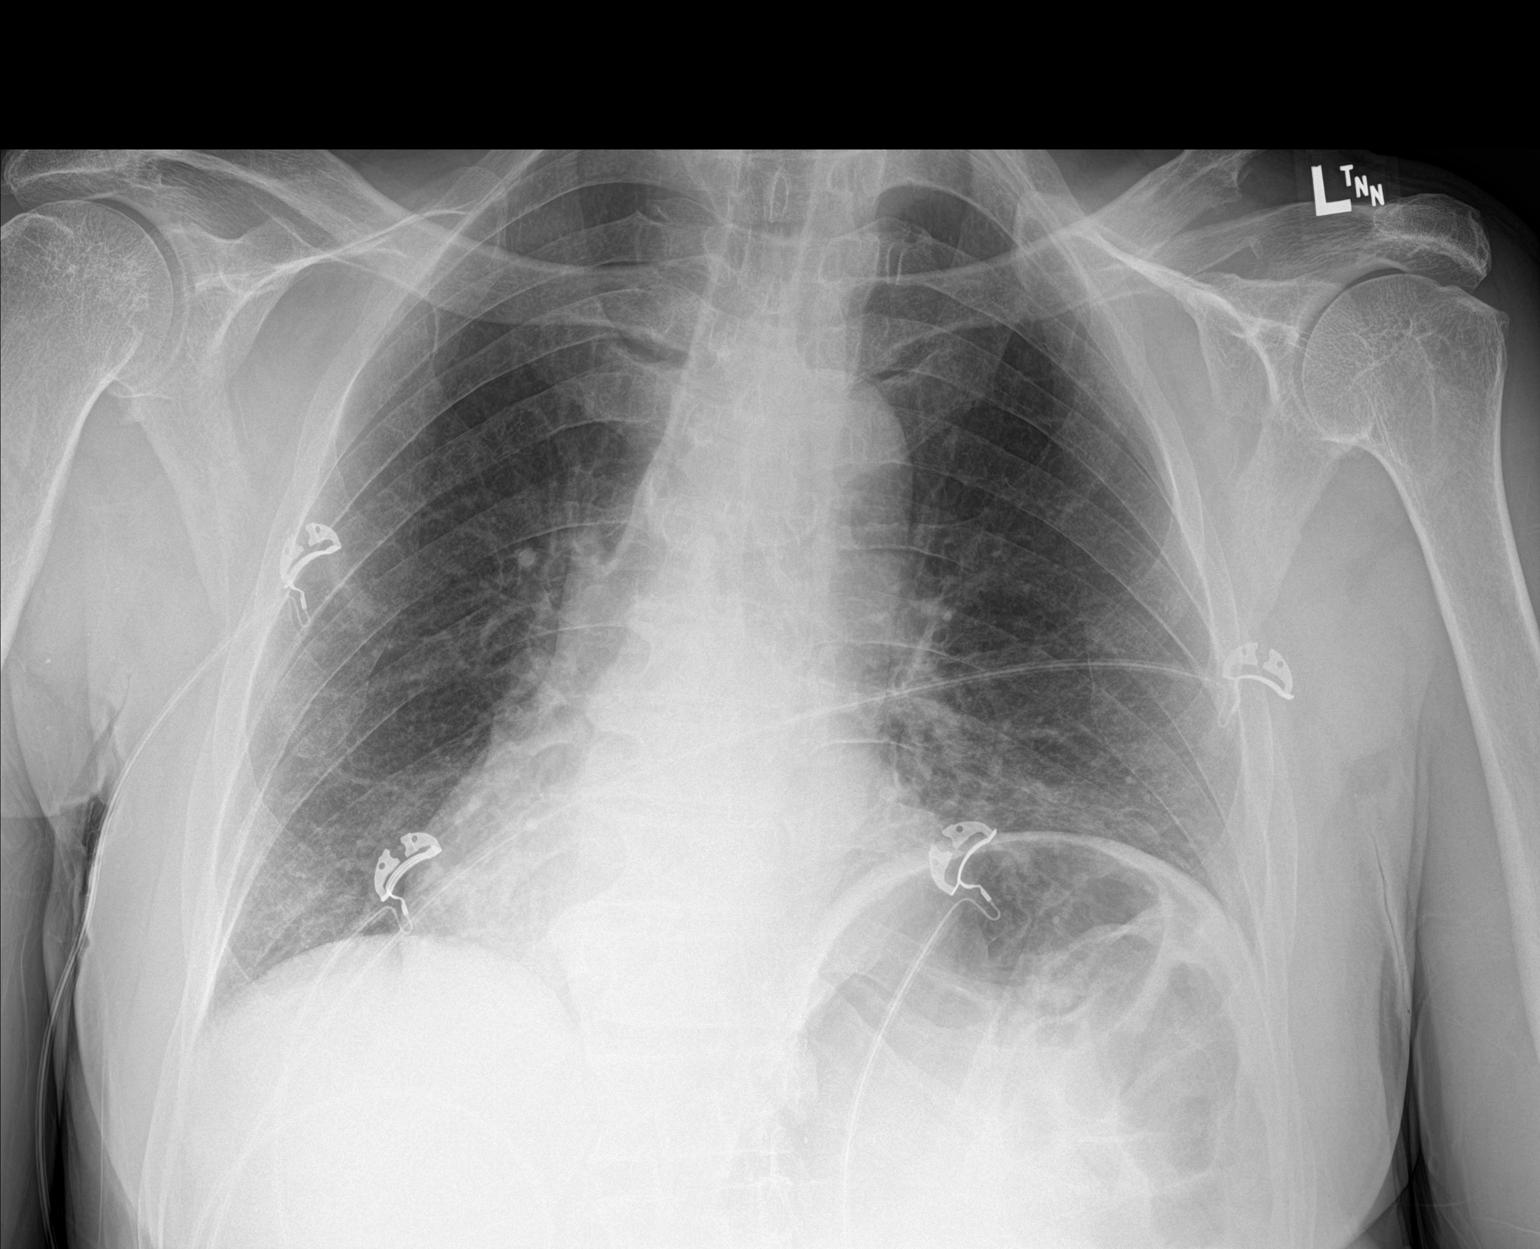

[1 of 1 positions shown; findings below may reference images not displayed]

FINDINGS: Upper normal heart size.

Mediastinal contours and pulmonary vascularity normal.

Bibasilar atelectasis.

Mild elevation of LEFT diaphragm.

No acute infiltrate, pleural effusion, or pneumothorax.

Bones demineralized with dextroconvex scoliosis and degenerative
disc disease changes of the thoracic spine.

Question old posttraumatic versus postsurgical deformity of the
distal LEFT clavicle.
IMPRESSION: Bibasilar atelectasis.

## 2018-02-20 IMAGING — CT CT CERVICAL SPINE W/O CM
4 of 8 series · 14 of 33 positions shown, 15 images · non-contrast
Comparison: Head CT [DATE]

CLINICAL DATA: Fall with headache.  Possible loss of consciousness.

EXAM:
CT HEAD WITHOUT CONTRAST
CT CERVICAL SPINE WITHOUT CONTRAST
TECHNIQUE: Multidetector CT imaging of the head and cervical spine was
performed following the standard protocol without intravenous
contrast. Multiplanar CT image reconstructions of the cervical spine
were also generated.

[Series 4: coronal soft tissue · coronal · 0.28mm/px · 3 of 63 slices shown]
[im 16/63  bone]
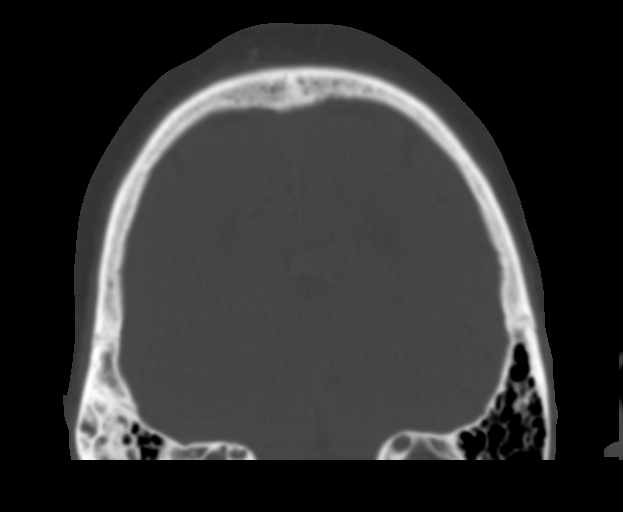
[im 32/63  bone]
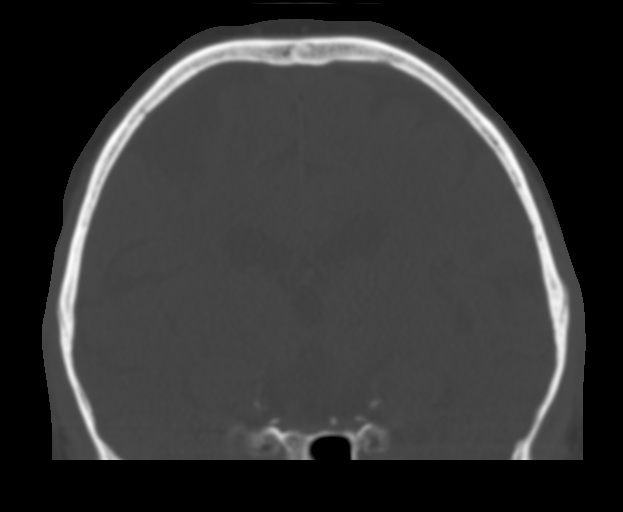
[im 47/63  bone]
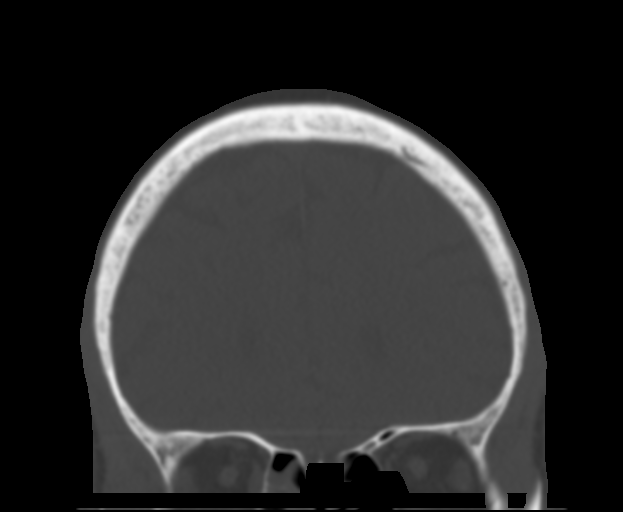

[Series 7: c spine soft · axial · 0.37mm/px · z∈[-213,-117]mm · 3 of 98 slices shown]
[im 25/98  soft-tissue]
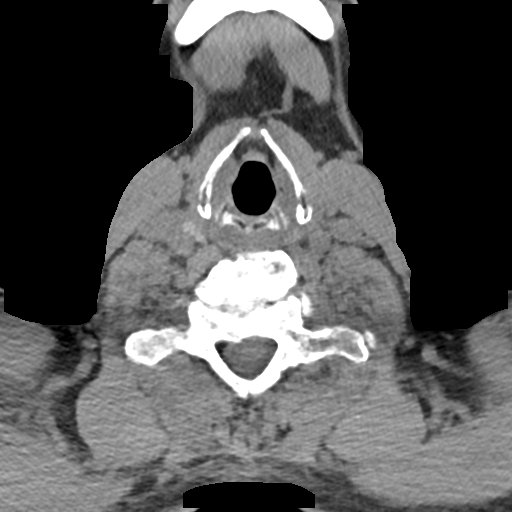
[im 49/98  soft-tissue]
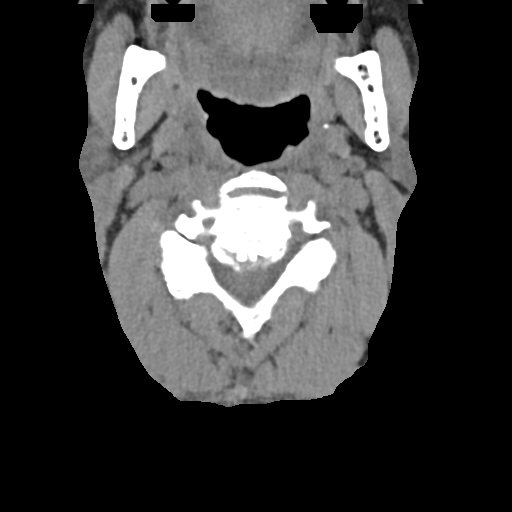
[im 73/98  soft-tissue]
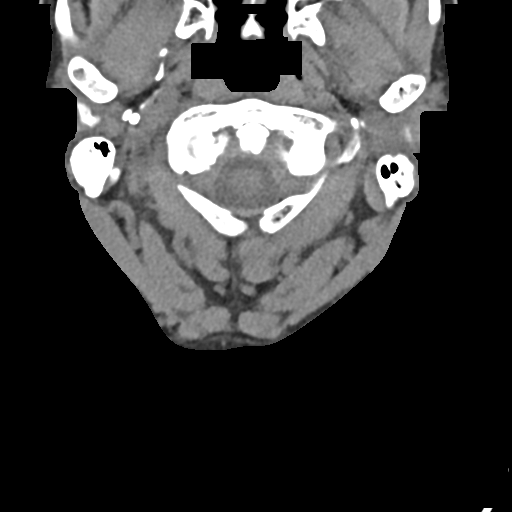

[Series 8: sagittal bone · sagittal · 0.29mm/px · 5 of 60 slices shown]
[im 10/60  bone]
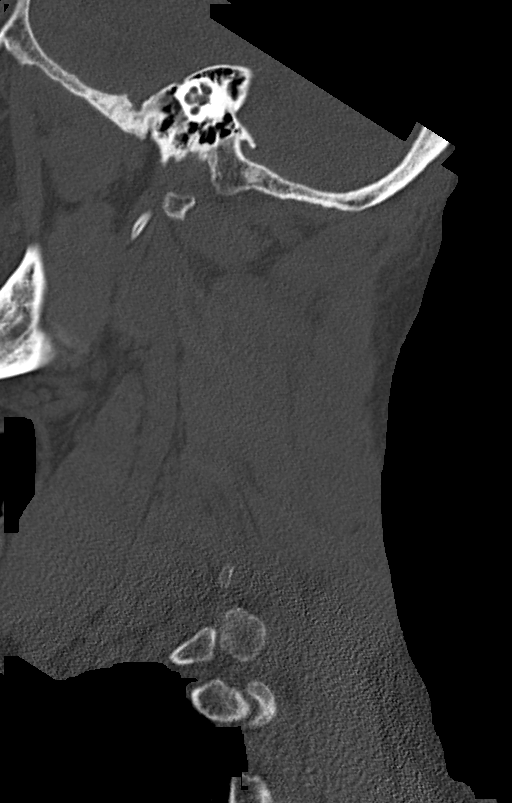
[im 20/60  bone]
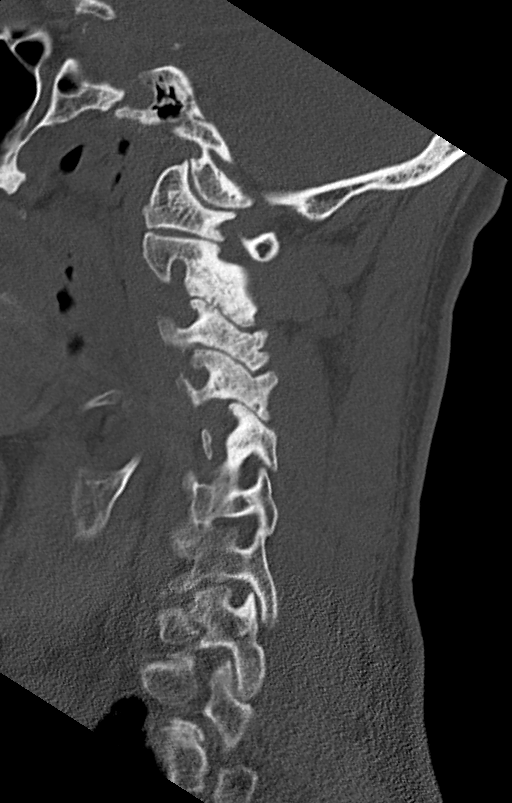
[im 30/60  bone]
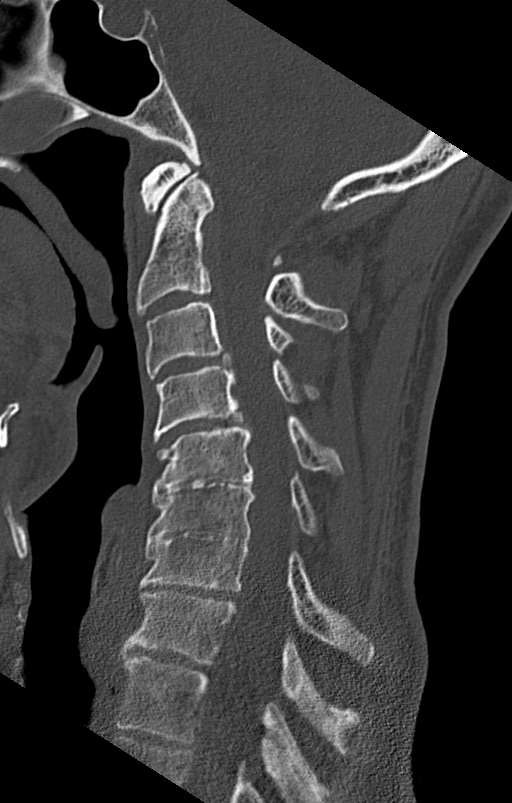
[im 40/60  bone]
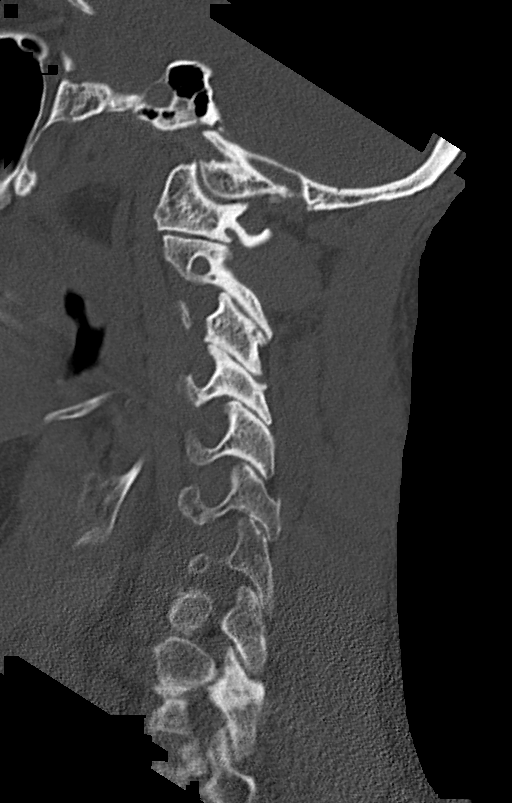
[im 50/60  bone]
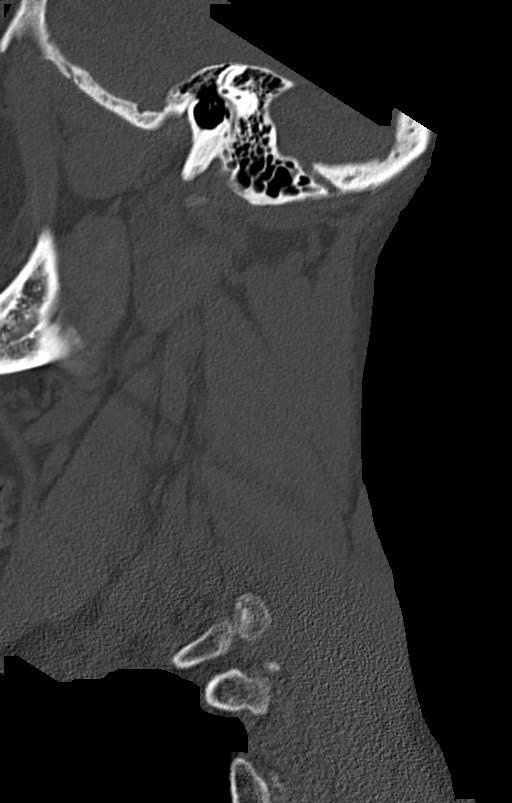

[Series 10: orthogonal bone · axial · 0.25mm/px · z∈[-236,-147]mm · 3 of 104 slices shown, 4 images]
[im 26/104  soft-tissue]
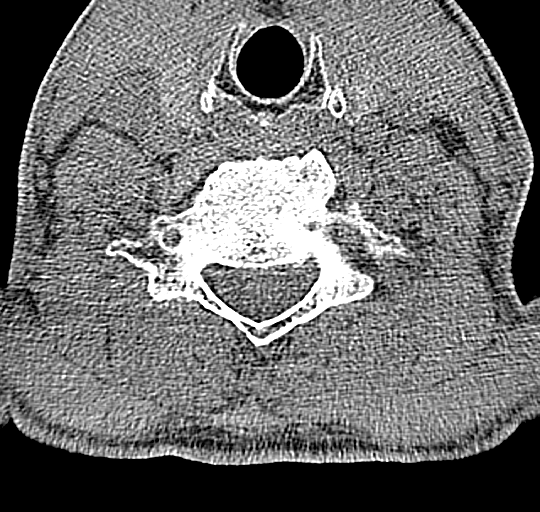
[im 26/104  bone]
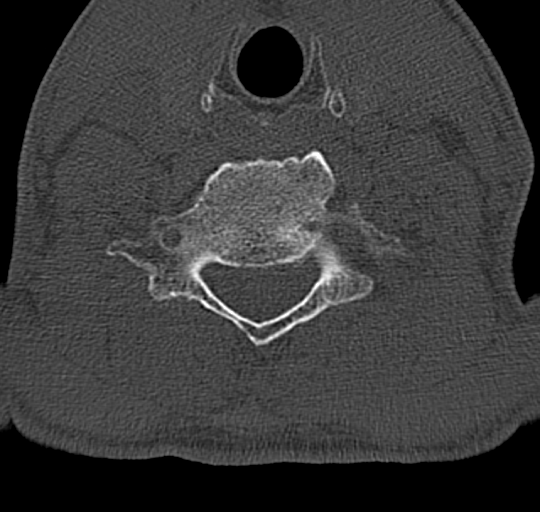
[im 52/104  bone]
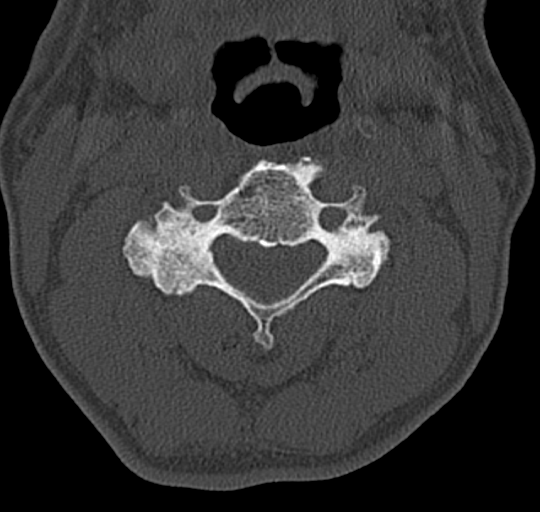
[im 78/104  bone]
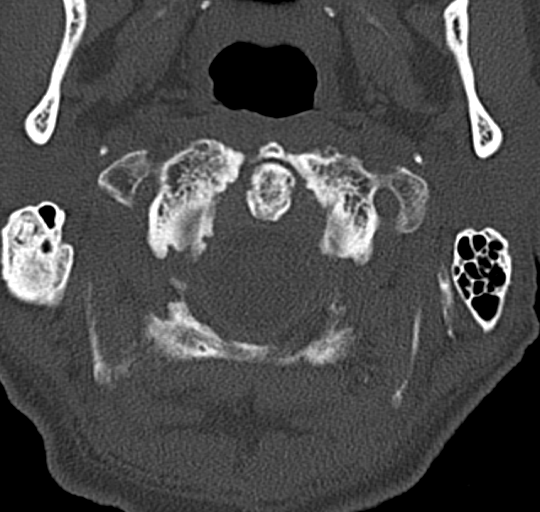

[14 of 33 positions shown; findings below may reference images not displayed]

FINDINGS: CT HEAD FINDINGS

Brain: There is no mass, hemorrhage or extra-axial collection. The
size and configuration of the ventricles and extra-axial CSF spaces
are normal. Old right basal ganglia lacunar infarct. The brain
parenchyma is normal.

Vascular: No abnormal hyperdensity of the major intracranial
arteries or dural venous sinuses. No intracranial atherosclerosis.

Skull: The visualized skull base, calvarium and extracranial soft
tissues are normal.

Sinuses/Orbits: No fluid levels or advanced mucosal thickening of
the visualized paranasal sinuses. No mastoid or middle ear effusion.
The orbits are normal.

CT CERVICAL SPINE FINDINGS

Alignment: Reversal of normal cervical lordosis. No static
subluxation. Lateral masses of C1 and C2 and the occipital condyles
are aligned.

Skull base and vertebrae: No acute fracture. There is sclerotic
change with a thin zone of transition in the left T2 pedicle and
transverse process.

Soft tissues and spinal canal: No prevertebral fluid or swelling. No
visible canal hematoma.

Disc levels: Severe right C2-C4 facet hypertrophy with milder
hypertrophy at these levels on the left. There is neural foraminal
stenosis at right C2-3, bilateral C3-4, right C4-5.

Upper chest: No pneumothorax, pulmonary nodule or pleural effusion.

Other: Normal visualized paraspinal cervical soft tissues.
IMPRESSION: 1. No acute intracranial abnormality.
2. Old right basal ganglia lacunar infarct.
3. No acute fracture or static subluxation of the cervical spine.
4. Multilevel facet arthrosis contributing to neural foraminal
stenosis at multiple upper cervical spine levels.
5. Sclerotic focus within the left T2 pedicle and transverse process
without aggressive features. Benign ideology such as bone
island/enostosis is favored. If the patient has any history of
malignancy a sclerotic metastasis would be difficult to exclude.
Non-emergent nuclear medicine bone scan or MRI with and without
contrast may be helpful.

## 2018-02-20 MED ORDER — ACETAMINOPHEN 325 MG PO TABS: 650.0000 mg | ORAL_TABLET | ORAL | Status: DC | PRN

## 2018-02-20 MED ORDER — THIAMINE HCL 100 MG/ML IJ SOLN: 100.0000 mg | Freq: Every day | INTRAMUSCULAR | Status: DC

## 2018-02-20 MED ORDER — ADULT MULTIVITAMIN W/MINERALS CH
1.0000 | ORAL_TABLET | Freq: Every day | ORAL | Status: DC
  Administered 2018-02-20 – 2018-02-21 (×2): 1 via ORAL
  Filled 2018-02-20 (×2): qty 1

## 2018-02-20 MED ORDER — VITAMIN B-1 100 MG PO TABS
100.0000 mg | ORAL_TABLET | Freq: Every day | ORAL | Status: DC
  Administered 2018-02-20 – 2018-02-21 (×2): 100 mg via ORAL
  Filled 2018-02-20 (×2): qty 1

## 2018-02-20 MED ORDER — LORAZEPAM 1 MG PO TABS: 1.0000 mg | ORAL_TABLET | Freq: Four times a day (QID) | ORAL | Status: DC | PRN

## 2018-02-20 MED ORDER — MORPHINE SULFATE (PF) 4 MG/ML IV SOLN
4.0000 mg | Freq: Once | INTRAVENOUS | Status: AC
  Administered 2018-02-20: 4 mg via INTRAVENOUS
  Filled 2018-02-20: qty 1

## 2018-02-20 MED ORDER — ENOXAPARIN SODIUM 40 MG/0.4ML ~~LOC~~ SOLN
40.0000 mg | SUBCUTANEOUS | Status: DC
  Administered 2018-02-20: 40 mg via SUBCUTANEOUS
  Filled 2018-02-20: qty 0.4

## 2018-02-20 MED ORDER — MORPHINE SULFATE (PF) 2 MG/ML IV SOLN
2.0000 mg | INTRAVENOUS | Status: DC | PRN
  Administered 2018-02-21 (×2): 2 mg via INTRAVENOUS
  Filled 2018-02-20 (×2): qty 1

## 2018-02-20 MED ORDER — HYDRALAZINE HCL 20 MG/ML IJ SOLN: 5.0000 mg | INTRAMUSCULAR | Status: DC | PRN

## 2018-02-20 MED ORDER — VITAMIN B-12 1000 MCG PO TABS
500.0000 ug | ORAL_TABLET | ORAL | Status: DC
  Administered 2018-02-21: 500 ug via ORAL

## 2018-02-20 MED ORDER — ONDANSETRON HCL 4 MG/2ML IJ SOLN
4.0000 mg | Freq: Four times a day (QID) | INTRAMUSCULAR | Status: DC | PRN
Start: 2018-02-20 — End: 2018-02-21

## 2018-02-20 MED ORDER — METOPROLOL TARTRATE 25 MG PO TABS
25.0000 mg | ORAL_TABLET | Freq: Two times a day (BID) | ORAL | Status: DC
  Administered 2018-02-20 – 2018-02-21 (×2): 25 mg via ORAL
  Filled 2018-02-20 (×2): qty 1

## 2018-02-20 MED ORDER — FOLIC ACID 1 MG PO TABS
1.0000 mg | ORAL_TABLET | Freq: Every day | ORAL | Status: DC
  Administered 2018-02-20 – 2018-02-21 (×2): 1 mg via ORAL
  Filled 2018-02-20 (×2): qty 1

## 2018-02-20 MED ORDER — LORAZEPAM 2 MG/ML IJ SOLN: 1.0000 mg | Freq: Four times a day (QID) | INTRAMUSCULAR | Status: DC | PRN

## 2018-02-20 MED ORDER — ONDANSETRON HCL 4 MG/2ML IJ SOLN
4.0000 mg | Freq: Once | INTRAMUSCULAR | Status: AC
  Administered 2018-02-20: 4 mg via INTRAVENOUS
  Filled 2018-02-20: qty 2

## 2018-02-20 NOTE — ED Provider Notes (Signed)
[facility] Emergency Department Provider Note   ____________________________________________   First MD Initiated Contact with Patient 02/20/18 1705     (approximate)  I have reviewed the triage vital signs and the nursing notes.   HISTORY  Chief Complaint Loss of Consciousness    HPI Terry Mitchell is a 67 y.o. male patient donated plasma today than had half a pint of alcohol sat on his porch and passed out and fell over backwards he complains a lot of left rib pain on palpation he has a little bit of neck pain as well he does not have any headache does not remember hitting his head and has no marks on his head does not have a headache is not nauseated or vomiting he is a lot of left chest pain with deep breathing and palpation but none otherwise.  Otherwise he feels well.   Past Medical History:  Diagnosis Date  . Depression    recently went on disability d/t diagnosis  . History of recent fall 01/2018   missed the last step on ladder, causing back discomfort  . Hypertension   . Prostate cancer ([place]) 12/2017   prostate    There are no active problems to display for this patient.   Past Surgical History:  Procedure Laterality Date  . BACK SURGERY  2001    2 rods and 6 screws in back  . Left shoulder surgery Left 1998   removed a piece of bone around collar bone    Prior to Admission medications   Medication Sig Start Date End Date Taking? Authorizing Provider  losartan (COZAAR) 100 MG tablet Take 100 mg by mouth daily at 12 noon.    Yes [provider]  predniSONE (DELTASONE) 10 MG tablet Take 10 mg by mouth daily with breakfast.   Yes [provider]  vitamin B-12 (CYANOCOBALAMIN) 500 MCG tablet Take 500 mcg by mouth 3 (three) times a week.   Yes [provider]    Allergies Penicillins and Lactose intolerance (gi)  Family History  Problem Relation Age of Onset  . Stroke Mother   . Kidney failure Mother    . Diabetes Maternal Aunt     Social History Social History   Tobacco Use  . Smoking status: Current Every Day Smoker    Packs/day: 2.00    Types: Cigars  . Smokeless tobacco: Never Used  . Tobacco comment: 2 cigars  Substance Use Topics  . Alcohol use: Yes    Alcohol/week: 1.2 oz    Types: 2 Cans of beer per week    Comment: occasional  . Drug use: Yes    Types: Cocaine    Comment: rarely uses but did so in last month    Review of Systems  Constitutional: No fever/chills Eyes: No visual changes. ENT: No sore throat. Cardiovascular: See HPI. Respiratory: Denies shortness of breath. Gastrointestinal: No abdominal pain.  No nausea, no vomiting.  No diarrhea.  No constipation. Genitourinary: Negative for dysuria. Musculoskeletal: Negative for back pain. Skin: Negative for rash. Neurological: Negative for headaches, focal weakness  ____________________________________________   PHYSICAL EXAM:  VITAL SIGNS: ED Triage Vitals  Enc Vitals Group     BP 02/20/18 1653 121/83     Pulse Rate 02/20/18 1653 62     Resp 02/20/18 1653 20     Temp 02/20/18 1653 97.8 F (36.6 C)     Temp Source 02/20/18 1653 Oral     SpO2 02/20/18 1653 98 %  Weight 02/20/18 1700 190 lb (86.2 kg)     Height 02/20/18 1700 6' (1.829 m)     Head Circumference --      Peak Flow --      Pain Score 02/20/18 1659 8     Pain Loc --      Pain Edu? --      Excl. in [place]? --    Constitutional: Alert and oriented. Well appearing and in no acute distress. Eyes: Conjunctivae are normal. PERRL. EOMI. Head: Atraumatic. Nose: No congestion/rhinnorhea. Mouth/Throat: Mucous membranes are moist.  Oropharynx non-erythematous. Neck: No stridor.  Minimal cervical spine tenderness to palpation. Cardiovascular: Normal rate, regular rhythm. Grossly normal heart sounds.  Good peripheral circulation. Respiratory: Normal respiratory effort.  No retractions. Lungs CTAB. Gastrointestinal: Soft and nontender. No  distention. No abdominal bruits. No CVA tenderness. Musculoskeletal: No lower extremity tenderness nor edema.  No joint effusions. Neurologic:  Normal speech and language. No gross focal neurologic deficits are appreciated.  Skin:  Skin is warm, dry and intact. No rash noted. Psychiatric: Mood and affect are normal. Speech and behavior are normal.  ____________________________________________   LABS (all labs ordered are listed, but only abnormal results are displayed)  Labs Reviewed  COMPREHENSIVE METABOLIC PANEL - Abnormal; Notable for the following components:      Result Value   Chloride 112 (*)    Glucose, Bld 111 (*)    BUN 28 (*)    Creatinine, Ser 1.67 (*)    Calcium 8.6 (*)    Total Protein 5.4 (*)    Albumin 3.2 (*)    AST 51 (*)    GFR calc non Af Amer 41 (*)    GFR calc Af Amer 48 (*)    All other components within normal limits  CBC WITH DIFFERENTIAL/PLATELET - Abnormal; Notable for the following components:   RBC 4.26 (*)    Platelets 121 (*)    All other components within normal limits  ETHANOL - Abnormal; Notable for the following components:   Alcohol, Ethyl (B) 66 (*)    All other components within normal limits  TROPONIN I  URINALYSIS, COMPLETE (UACMP) WITH MICROSCOPIC   ____________________________________________  EKG  EKG Marked changes compared with last month T wave inversion slight ST segment depression 1 to L in the V leads. ____________________________________________  RADIOLOGY  ED MD interpretation: Chest x-ray read radiology reviewed by me shows no acute disease CT of the head and neck show no acute fractures or some bony sclerosis and T2 which is felt to be likely benign.  Official radiology report(s): Ct Head Wo Contrast  Result Date: 02/20/2018 CLINICAL DATA:  Fall with headache.  Possible loss of consciousness. EXAM: CT HEAD WITHOUT CONTRAST CT CERVICAL SPINE WITHOUT CONTRAST TECHNIQUE: Multidetector CT imaging of the head and cervical  spine was performed following the standard protocol without intravenous contrast. Multiplanar CT image reconstructions of the cervical spine were also generated. COMPARISON:  Head CT 05/30/2017 FINDINGS: CT HEAD FINDINGS Brain: There is no mass, hemorrhage or extra-axial collection. The size and configuration of the ventricles and extra-axial CSF spaces are normal. Old right basal ganglia lacunar infarct. The brain parenchyma is normal. Vascular: No abnormal hyperdensity of the major intracranial arteries or dural venous sinuses. No intracranial atherosclerosis. Skull: The visualized skull base, calvarium and extracranial soft tissues are normal. Sinuses/Orbits: No fluid levels or advanced mucosal thickening of the visualized paranasal sinuses. No mastoid or middle ear effusion. The orbits are normal. CT CERVICAL SPINE FINDINGS  Alignment: Reversal of normal cervical lordosis. No static subluxation. Lateral masses of C1 and C2 and the occipital condyles are aligned. Skull base and vertebrae: No acute fracture. There is sclerotic change with a thin zone of transition in the left T2 pedicle and transverse process. Soft tissues and spinal canal: No prevertebral fluid or swelling. No visible canal hematoma. Disc levels: Severe right C2-C4 facet hypertrophy with milder hypertrophy at these levels on the left. There is neural foraminal stenosis at right C2-3, bilateral C3-4, right C4-5. Upper chest: No pneumothorax, pulmonary nodule or pleural effusion. Other: Normal visualized paraspinal cervical soft tissues. IMPRESSION: 1. No acute intracranial abnormality. 2. Old right basal ganglia lacunar infarct. 3. No acute fracture or static subluxation of the cervical spine. 4. Multilevel facet arthrosis contributing to neural foraminal stenosis at multiple upper cervical spine levels. 5. Sclerotic focus within the left T2 pedicle and transverse process without aggressive features. Benign ideology such as bone island/enostosis is  favored. If the patient has any history of malignancy a sclerotic metastasis would be difficult to exclude. Non-emergent nuclear medicine bone scan or MRI with and without contrast may be helpful. Electronically Signed   By: Ulyses Jarred M.D.   On: 02/20/2018 18:11   Ct Cervical Spine Wo Contrast  Result Date: 02/20/2018 CLINICAL DATA:  Fall with headache.  Possible loss of consciousness. EXAM: CT HEAD WITHOUT CONTRAST CT CERVICAL SPINE WITHOUT CONTRAST TECHNIQUE: Multidetector CT imaging of the head and cervical spine was performed following the standard protocol without intravenous contrast. Multiplanar CT image reconstructions of the cervical spine were also generated. COMPARISON:  Head CT 05/30/2017 FINDINGS: CT HEAD FINDINGS Brain: There is no mass, hemorrhage or extra-axial collection. The size and configuration of the ventricles and extra-axial CSF spaces are normal. Old right basal ganglia lacunar infarct. The brain parenchyma is normal. Vascular: No abnormal hyperdensity of the major intracranial arteries or dural venous sinuses. No intracranial atherosclerosis. Skull: The visualized skull base, calvarium and extracranial soft tissues are normal. Sinuses/Orbits: No fluid levels or advanced mucosal thickening of the visualized paranasal sinuses. No mastoid or middle ear effusion. The orbits are normal. CT CERVICAL SPINE FINDINGS Alignment: Reversal of normal cervical lordosis. No static subluxation. Lateral masses of C1 and C2 and the occipital condyles are aligned. Skull base and vertebrae: No acute fracture. There is sclerotic change with a thin zone of transition in the left T2 pedicle and transverse process. Soft tissues and spinal canal: No prevertebral fluid or swelling. No visible canal hematoma. Disc levels: Severe right C2-C4 facet hypertrophy with milder hypertrophy at these levels on the left. There is neural foraminal stenosis at right C2-3, bilateral C3-4, right C4-5. Upper chest: No  pneumothorax, pulmonary nodule or pleural effusion. Other: Normal visualized paraspinal cervical soft tissues. IMPRESSION: 1. No acute intracranial abnormality. 2. Old right basal ganglia lacunar infarct. 3. No acute fracture or static subluxation of the cervical spine. 4. Multilevel facet arthrosis contributing to neural foraminal stenosis at multiple upper cervical spine levels. 5. Sclerotic focus within the left T2 pedicle and transverse process without aggressive features. Benign ideology such as bone island/enostosis is favored. If the patient has any history of malignancy a sclerotic metastasis would be difficult to exclude. Non-emergent nuclear medicine bone scan or MRI with and without contrast may be helpful. Electronically Signed   By: Ulyses Jarred M.D.   On: 02/20/2018 18:11   Dg Chest Portable 1 View  Result Date: 02/20/2018 CLINICAL DATA:  Syncopal episode this morning sitting on a  porch ledge, LEFT rib pain, donating plasma this morning, hypertension, prostate cancer, smoker EXAM: PORTABLE CHEST 1 VIEW COMPARISON:  Portable exam 1720 hours without priors for comparison FINDINGS: Upper normal heart size. Mediastinal contours and pulmonary vascularity normal. Bibasilar atelectasis. Mild elevation of LEFT diaphragm. No acute infiltrate, pleural effusion, or pneumothorax. Bones demineralized with dextroconvex scoliosis and degenerative disc disease changes of the thoracic spine. Question old posttraumatic versus postsurgical deformity of the distal LEFT clavicle. IMPRESSION: Bibasilar atelectasis. Electronically Signed   By: Lavonia Dana M.D.   On: 02/20/2018 17:32    ____________________________________________   PROCEDURES  Procedure(s) performed:   Procedures  Critical Care performed:   ____________________________________________   INITIAL IMPRESSION / ASSESSMENT AND PLAN / ED COURSE  Discussed with the hospitalist and hospitalist asked me to call cardiology cardiology recommends  watch overnight but do not treat with any blood thinners.      ____________________________________________   FINAL CLINICAL IMPRESSION(S) / ED DIAGNOSES  Final diagnoses:  Syncope and collapse  Abnormal EKG     ED Discharge Orders    None       Note:  This document was prepared using Dragon voice recognition software and may include unintentional dictation errors.    Nena Polio, MD 02/20/18 [phone]

## 2018-02-20 NOTE — ED Notes (Signed)
Has abrasion noted to left wrist. Unknown LOC but pt does not remember the fall. Left sided rib pain. Respirations are even and non-labored.

## 2018-02-20 NOTE — H&P (Addendum)
[place] [place] NAME: Mackenzy Eisenberg    MR#:  [id number]  DATE OF BIRTH:  1950-08-04  DATE OF ADMISSION:  02/20/2018  PRIMARY CARE PHYSICIAN: [name], MD   REQUESTING/REFERRING PHYSICIAN: Conni Slipper, MD  CHIEF COMPLAINT:   Chief Complaint  Patient presents with  . Loss of Consciousness    HISTORY OF PRESENT ILLNESS:  Terry Mitchell  is a 67 y.o. male with a known history of HTN, prostate cancer, and depression, presenting to the ED with syncope and chest pain. He had donated plasma earlier this morning.  He then drank half a pint of brandy. He was sitting on the porch talking to a friend when he suddenly felt lightheaded and started seeing black spots. He lost consciousness and felt backwards into some bushes. He did not hit his head. He landed on his bottom and side. He was out for ~30 seconds. Since the fall, the left side of his chest has been hurting. The pain feels like a "tightening". The pain was worse with walking to the stretcher. The pain radiates to his back. The pain is worse with coughing and deep inspiration. No shortness of breath, no lower extremity edema, no nausea, no diaphoresis, no recent travel, no recent surgeries. No history of blood clots. PAST MEDICAL HISTORY:   Past Medical History:  Diagnosis Date  . Depression    recently went on disability d/t diagnosis  . History of recent fall 01/2018   missed the last step on ladder, causing back discomfort  . Hypertension   . Prostate cancer ([place]) 12/2017   prostate    PAST SURGICAL HISTORY:   Past Surgical History:  Procedure Laterality Date  . BACK SURGERY  2001    2 rods and 6 screws in back  . Left shoulder surgery Left 1998   removed a piece of bone around collar bone    SOCIAL HISTORY:   Social History   Tobacco Use  . Smoking status: Current Every Day Smoker    Packs/day: 2.00    Types: Cigars  . Smokeless tobacco: Never Used  . Tobacco  comment: 2 cigars  Substance Use Topics  . Alcohol use: Yes    Alcohol/week: 1.2 oz    Types: 2 Cans of beer per week    Comment: occasional    FAMILY HISTORY:   Family History  Problem Relation Age of Onset  . Stroke Mother   . Kidney failure Mother   . Diabetes Maternal Aunt     DRUG ALLERGIES:   Allergies  Allergen Reactions  . Penicillins Hives    Hives, welts, swelling profusely . Pretty severe reaction Has patient had a PCN reaction causing immediate rash, facial/tongue/throat swelling, SOB or lightheadedness with hypotension: No Has patient had a PCN reaction causing severe rash involving mucus membranes or skin necrosis: Yes Has patient had a PCN reaction that required hospitalization: Yes Has patient had a PCN reaction occurring within the last 10 years: No If all of the above answers are "NO", then may proceed with Cephalosporin use.   . Lactose Intolerance (Gi)     Gi upset    REVIEW OF SYSTEMS:   Review of Systems  Constitutional: Negative for chills and fever.  HENT: Negative for congestion and sore throat.   Eyes: Negative for blurred vision and double vision.  Respiratory: Negative for cough and shortness of breath.   Cardiovascular: Positive for chest pain. Negative for palpitations and  leg swelling.  Gastrointestinal: Negative for abdominal pain, nausea and vomiting.  Genitourinary: Negative for dysuria and frequency.  Musculoskeletal: Positive for back pain and joint pain.  Neurological: Negative for dizziness and headaches.  Psychiatric/Behavioral: Negative for depression. The patient is not nervous/anxious.     MEDICATIONS AT HOME:   Prior to Admission medications   Medication Sig Start Date End Date Taking? Authorizing Provider  losartan (COZAAR) 100 MG tablet Take 100 mg by mouth daily at 12 noon.    Yes [provider]  predniSONE (DELTASONE) 10 MG tablet Take 10 mg by mouth daily with breakfast.   Yes [provider]    vitamin B-12 (CYANOCOBALAMIN) 500 MCG tablet Take 500 mcg by mouth 3 (three) times a week.   Yes [provider]      VITAL SIGNS:  Blood pressure 134/82, pulse 76, temperature 97.8 F (36.6 C), temperature source Oral, resp. rate 17, height 6' (1.829 m), weight 86.2 kg (190 lb), SpO2 96 %.  PHYSICAL EXAMINATION:  Physical Exam  GENERAL:  67 y.o.-year-old patient lying in the bed with no acute distress.  EYES: Pupils equal, round, reactive to light and accommodation. No scleral icterus. Extraocular muscles intact.  HEENT: Head atraumatic, normocephalic. Oropharynx and nasopharynx clear.  NECK:  Supple, no jugular venous distention. No thyroid enlargement, no tenderness.  LUNGS: Normal breath sounds bilaterally, no wheezing, rales,rhonchi or crepitation. No use of accessory muscles of respiration.  CARDIOVASCULAR: S1, S2 normal. No murmurs, rubs, or gallops. +tenderness to palpation of the left chest wall. ABDOMEN: Soft, nontender, nondistended. Bowel sounds present. No organomegaly or mass.  EXTREMITIES: No pedal edema, cyanosis, or clubbing.  NEUROLOGIC: Cranial nerves II through XII are intact. Muscle strength 5/5 in all extremities. Sensation intact. Gait not checked.  PSYCHIATRIC: The patient is alert and oriented x 3.  SKIN: No obvious rash. +superficial abrasion near the left wrist.  LABORATORY PANEL:   CBC Recent Labs  Lab 02/20/18 1708  WBC 8.3  HGB 14.3  HCT 42.5  PLT 121*   ------------------------------------------------------------------------------------------------------------------  Chemistries  Recent Labs  Lab 02/20/18 1708  NA 144  K 3.5  CL 112*  CO2 22  GLUCOSE 111*  BUN 28*  CREATININE 1.67*  CALCIUM 8.6*  AST 51*  ALT 30  ALKPHOS 54  BILITOT 0.5   ------------------------------------------------------------------------------------------------------------------  Cardiac Enzymes Recent Labs  Lab 02/20/18 1708  TROPONINI <0.03    ------------------------------------------------------------------------------------------------------------------  RADIOLOGY:  Ct Head Wo Contrast  Result Date: 02/20/2018 CLINICAL DATA:  Fall with headache.  Possible loss of consciousness. EXAM: CT HEAD WITHOUT CONTRAST CT CERVICAL SPINE WITHOUT CONTRAST TECHNIQUE: Multidetector CT imaging of the head and cervical spine was performed following the standard protocol without intravenous contrast. Multiplanar CT image reconstructions of the cervical spine were also generated. COMPARISON:  Head CT 05/30/2017 FINDINGS: CT HEAD FINDINGS Brain: There is no mass, hemorrhage or extra-axial collection. The size and configuration of the ventricles and extra-axial CSF spaces are normal. Old right basal ganglia lacunar infarct. The brain parenchyma is normal. Vascular: No abnormal hyperdensity of the major intracranial arteries or dural venous sinuses. No intracranial atherosclerosis. Skull: The visualized skull base, calvarium and extracranial soft tissues are normal. Sinuses/Orbits: No fluid levels or advanced mucosal thickening of the visualized paranasal sinuses. No mastoid or middle ear effusion. The orbits are normal. CT CERVICAL SPINE FINDINGS Alignment: Reversal of normal cervical lordosis. No static subluxation. Lateral masses of C1 and C2 and the occipital condyles are aligned. Skull base and vertebrae:  No acute fracture. There is sclerotic change with a thin zone of transition in the left T2 pedicle and transverse process. Soft tissues and spinal canal: No prevertebral fluid or swelling. No visible canal hematoma. Disc levels: Severe right C2-C4 facet hypertrophy with milder hypertrophy at these levels on the left. There is neural foraminal stenosis at right C2-3, bilateral C3-4, right C4-5. Upper chest: No pneumothorax, pulmonary nodule or pleural effusion. Other: Normal visualized paraspinal cervical soft tissues. IMPRESSION: 1. No acute intracranial  abnormality. 2. Old right basal ganglia lacunar infarct. 3. No acute fracture or static subluxation of the cervical spine. 4. Multilevel facet arthrosis contributing to neural foraminal stenosis at multiple upper cervical spine levels. 5. Sclerotic focus within the left T2 pedicle and transverse process without aggressive features. Benign ideology such as bone island/enostosis is favored. If the patient has any history of malignancy a sclerotic metastasis would be difficult to exclude. Non-emergent nuclear medicine bone scan or MRI with and without contrast may be helpful. Electronically Signed   By: Ulyses Jarred M.D.   On: 02/20/2018 18:11   Ct Cervical Spine Wo Contrast  Result Date: 02/20/2018 CLINICAL DATA:  Fall with headache.  Possible loss of consciousness. EXAM: CT HEAD WITHOUT CONTRAST CT CERVICAL SPINE WITHOUT CONTRAST TECHNIQUE: Multidetector CT imaging of the head and cervical spine was performed following the standard protocol without intravenous contrast. Multiplanar CT image reconstructions of the cervical spine were also generated. COMPARISON:  Head CT 05/30/2017 FINDINGS: CT HEAD FINDINGS Brain: There is no mass, hemorrhage or extra-axial collection. The size and configuration of the ventricles and extra-axial CSF spaces are normal. Old right basal ganglia lacunar infarct. The brain parenchyma is normal. Vascular: No abnormal hyperdensity of the major intracranial arteries or dural venous sinuses. No intracranial atherosclerosis. Skull: The visualized skull base, calvarium and extracranial soft tissues are normal. Sinuses/Orbits: No fluid levels or advanced mucosal thickening of the visualized paranasal sinuses. No mastoid or middle ear effusion. The orbits are normal. CT CERVICAL SPINE FINDINGS Alignment: Reversal of normal cervical lordosis. No static subluxation. Lateral masses of C1 and C2 and the occipital condyles are aligned. Skull base and vertebrae: No acute fracture. There is sclerotic  change with a thin zone of transition in the left T2 pedicle and transverse process. Soft tissues and spinal canal: No prevertebral fluid or swelling. No visible canal hematoma. Disc levels: Severe right C2-C4 facet hypertrophy with milder hypertrophy at these levels on the left. There is neural foraminal stenosis at right C2-3, bilateral C3-4, right C4-5. Upper chest: No pneumothorax, pulmonary nodule or pleural effusion. Other: Normal visualized paraspinal cervical soft tissues. IMPRESSION: 1. No acute intracranial abnormality. 2. Old right basal ganglia lacunar infarct. 3. No acute fracture or static subluxation of the cervical spine. 4. Multilevel facet arthrosis contributing to neural foraminal stenosis at multiple upper cervical spine levels. 5. Sclerotic focus within the left T2 pedicle and transverse process without aggressive features. Benign ideology such as bone island/enostosis is favored. If the patient has any history of malignancy a sclerotic metastasis would be difficult to exclude. Non-emergent nuclear medicine bone scan or MRI with and without contrast may be helpful. Electronically Signed   By: Ulyses Jarred M.D.   On: 02/20/2018 18:11   Dg Chest Portable 1 View  Result Date: 02/20/2018 CLINICAL DATA:  Syncopal episode this morning sitting on a porch ledge, LEFT rib pain, donating plasma this morning, hypertension, prostate cancer, smoker EXAM: PORTABLE CHEST 1 VIEW COMPARISON:  Portable exam 1720 hours without  priors for comparison FINDINGS: Upper normal heart size. Mediastinal contours and pulmonary vascularity normal. Bibasilar atelectasis. Mild elevation of LEFT diaphragm. No acute infiltrate, pleural effusion, or pneumothorax. Bones demineralized with dextroconvex scoliosis and degenerative disc disease changes of the thoracic spine. Question old posttraumatic versus postsurgical deformity of the distal LEFT clavicle. IMPRESSION: Bibasilar atelectasis. Electronically Signed   By: Lavonia Dana M.D.   On: 02/20/2018 17:32    IMPRESSION AND PLAN:   Chest pain- concerning for ACS in the setting of new TWIs in the inferior leads and syncopal episode. Pain could also be due to his fall, as he has chest wall tenderness on exam. PE unlikely with wells score of 0. - Cardiology consulted- do not feel he needs heparin and will see him in the morning - Trend troponins - Repeat EKG in the morning - Start Aspirin 81mg  daily - Start Lopressor 25mg  bid - Cardiac monitoring - Morphine prn for pain  Syncope/Fall- likely cardiac related. CT head was negative. No focal neurologic findings. - Cardiac work-up as above - Cardiac monitoring  Back Pain- likely due to fall. CT c-spine with sclerotic focus int he left T2 pedicle and transverse process. - Tylenol and morphine prn - Consider outpatient bone scan to r/o metastasis given patient's history of prostate cancer.  HTN- BPs well-controlled on admission. - Hold Losartan for ?AKI - Start Lopressor 25mg  bid - Hydralazine prn  ?AKI vs CKD- Cr mildly elevated to 1.67 (baseline ?around 1.3) - Hold Losartan for now, can restart if Cr improves - Avoid nephrotoxic agents - Recheck cmp in the morning  Prostate Cancer- has prostate biopsy scheduled for 8/9 - Monitor  Alcohol abuse- drinks 1/2 pint of liquor daily - CIWA  Tobacco use - Declined nicotine patch  Protein/calorie malnutrition- in the setting of alcohol use and prostate cancer. Albumin and total protein low on admission. - Nutrition consult  All the records are reviewed and case discussed with ED provider. Management plans discussed with the patient, family and they are in agreement.  CODE STATUS: FULL  TOTAL TIME TAKING CARE OF THIS PATIENT: 35 minutes.    Berna Spare Julious Langlois M.D on 02/20/2018 at 7:12 PM  Between 7am to 6pm - Pager [phone]  After 6pm go to [email or web address] - [employer] [facility]  Office  [phone]  CC:  Primary care physician; [name], MD   Note: This dictation was prepared with Dragon dictation along with smaller phrase technology. Any transcriptional errors that result from this process are unintentional.

## 2018-02-20 NOTE — ED Provider Notes (Signed)
[facility] Emergency Department Provider Note   ____________________________________________   First MD Initiated Contact with Patient 02/20/18 1705     (approximate)  I have reviewed the triage vital signs and the nursing notes.   HISTORY  Chief Complaint Loss of Consciousness    HPI Terry Mitchell is a 67 y.o. male patient had about a half 1/5 of liquor today and donated plasma as well.  He was sitting on his porch and fell over backwards about 3 feet.  He complains of left rib pain with movement and coughing.  He does not remember falling.  He does complain of a little bit of neck pain on palpation.   Past Medical History:  Diagnosis Date  . Depression    recently went on disability d/t diagnosis  . History of recent fall 01/2018   missed the last step on ladder, causing back discomfort  . Hypertension   . Prostate cancer ([place]) 12/2017   prostate    There are no active problems to display for this patient.   Past Surgical History:  Procedure Laterality Date  . BACK SURGERY  2001    2 rods and 6 screws in back  . Left shoulder surgery Left 1998   removed a piece of bone around collar bone    Prior to Admission medications   Medication Sig Start Date End Date Taking? Authorizing Provider  losartan (COZAAR) 100 MG tablet Take 100 mg by mouth daily at 12 noon.    Yes [provider]  predniSONE (DELTASONE) 10 MG tablet Take 10 mg by mouth daily with breakfast.   Yes [provider]  vitamin B-12 (CYANOCOBALAMIN) 500 MCG tablet Take 500 mcg by mouth 3 (three) times a week.   Yes [provider]    Allergies Penicillins and Lactose intolerance (gi)  Family History  Problem Relation Age of Onset  . Stroke Mother   . Kidney failure Mother   . Diabetes Maternal Aunt     Social History Social History   Tobacco Use  . Smoking status: Current Every Day Smoker    Packs/day: 2.00    Types: Cigars  .  Smokeless tobacco: Never Used  . Tobacco comment: 2 cigars  Substance Use Topics  . Alcohol use: Yes    Alcohol/week: 1.2 oz    Types: 2 Cans of beer per week    Comment: occasional  . Drug use: Yes    Types: Cocaine    Comment: rarely uses but did so in last month    Review of systems Constitutional: No fever/chills Eyes: No visual changes. ENT: No sore throat. Cardiovascular: Denies chest pain. Respiratory: Denies shortness of breath. Gastrointestinal: No abdominal pain.  No nausea, no vomiting.  No diarrhea.  No constipation. Genitourinary: Negative for dysuria. Musculoskeletal: Negative for back pain. Skin: Negative for rash. Neurological: Negative for headaches, focal weakness   ____________________________________________   PHYSICAL EXAM:  VITAL SIGNS: ED Triage Vitals  Enc Vitals Group     BP 02/20/18 1653 121/83     Pulse Rate 02/20/18 1653 62     Resp 02/20/18 1653 20     Temp 02/20/18 1653 97.8 F (36.6 C)     Temp Source 02/20/18 1653 Oral     SpO2 02/20/18 1653 98 %     Weight 02/20/18 1700 190 lb (86.2 kg)     Height 02/20/18 1700 6' (1.829 m)     Head Circumference --  Peak Flow --      Pain Score 02/20/18 1659 8     Pain Loc --      Pain Edu? --      Excl. in [place]? --     Constitutional: Alert and oriented.  Complaining of chest pain with movement Eyes: Conjunctivae are normal. PERRL. EOMI. Head: Atraumatic. Nose: No congestion/rhinnorhea. Mouth/Throat: Mucous membranes are moist.  Oropharynx non-erythematous. Neck: No stridor.  Some minimal cervical spine tenderness to palpation. Cardiovascular: Normal rate, regular rhythm. Grossly normal heart sounds.  Good peripheral circulation. Respiratory: Normal respiratory effort.  No retractions. Lungs CTAB. Gastrointestinal: Soft and nontender. No distention. No abdominal bruits. No CVA tenderness. Musculoskeletal: No lower extremity tenderness nor edema.  No joint effusions. Neurologic:  Normal  speech and language. No gross focal neurologic deficits are appreciated. No gait instability. Skin:  Skin is warm, dry and intact. No rash noted. Psychiatric: Mood and affect are normal. Speech and behavior are normal.  ____________________________________________   LABS (all labs ordered are listed, but only abnormal results are displayed)  Labs Reviewed  COMPREHENSIVE METABOLIC PANEL - Abnormal; Notable for the following components:      Result Value   Chloride 112 (*)    Glucose, Bld 111 (*)    BUN 28 (*)    Creatinine, Ser 1.67 (*)    Calcium 8.6 (*)    Total Protein 5.4 (*)    Albumin 3.2 (*)    AST 51 (*)    GFR calc non Af Amer 41 (*)    GFR calc Af Amer 48 (*)    All other components within normal limits  CBC WITH DIFFERENTIAL/PLATELET - Abnormal; Notable for the following components:   RBC 4.26 (*)    Platelets 121 (*)    All other components within normal limits  ETHANOL - Abnormal; Notable for the following components:   Alcohol, Ethyl (B) 66 (*)    All other components within normal limits  TROPONIN I  URINALYSIS, COMPLETE (UACMP) WITH MICROSCOPIC   ____________________________________________  EKG  EKG read and interpreted by me shows marked worsening of T wave inversion in 1,  aVL V2 somewhat in the other V leads compared to July of this year ____________________________________________  RADIOLOGY  ED MD interpretation:  Official radiology report(s): Ct Head Wo Contrast  Result Date: 02/20/2018 CLINICAL DATA:  Fall with headache.  Possible loss of consciousness. EXAM: CT HEAD WITHOUT CONTRAST CT CERVICAL SPINE WITHOUT CONTRAST TECHNIQUE: Multidetector CT imaging of the head and cervical spine was performed following the standard protocol without intravenous contrast. Multiplanar CT image reconstructions of the cervical spine were also generated. COMPARISON:  Head CT 05/30/2017 FINDINGS: CT HEAD FINDINGS Brain: There is no mass, hemorrhage or extra-axial  collection. The size and configuration of the ventricles and extra-axial CSF spaces are normal. Old right basal ganglia lacunar infarct. The brain parenchyma is normal. Vascular: No abnormal hyperdensity of the major intracranial arteries or dural venous sinuses. No intracranial atherosclerosis. Skull: The visualized skull base, calvarium and extracranial soft tissues are normal. Sinuses/Orbits: No fluid levels or advanced mucosal thickening of the visualized paranasal sinuses. No mastoid or middle ear effusion. The orbits are normal. CT CERVICAL SPINE FINDINGS Alignment: Reversal of normal cervical lordosis. No static subluxation. Lateral masses of C1 and C2 and the occipital condyles are aligned. Skull base and vertebrae: No acute fracture. There is sclerotic change with a thin zone of transition in the left T2 pedicle and transverse process. Soft tissues and spinal  canal: No prevertebral fluid or swelling. No visible canal hematoma. Disc levels: Severe right C2-C4 facet hypertrophy with milder hypertrophy at these levels on the left. There is neural foraminal stenosis at right C2-3, bilateral C3-4, right C4-5. Upper chest: No pneumothorax, pulmonary nodule or pleural effusion. Other: Normal visualized paraspinal cervical soft tissues. IMPRESSION: 1. No acute intracranial abnormality. 2. Old right basal ganglia lacunar infarct. 3. No acute fracture or static subluxation of the cervical spine. 4. Multilevel facet arthrosis contributing to neural foraminal stenosis at multiple upper cervical spine levels. 5. Sclerotic focus within the left T2 pedicle and transverse process without aggressive features. Benign ideology such as bone island/enostosis is favored. If the patient has any history of malignancy a sclerotic metastasis would be difficult to exclude. Non-emergent nuclear medicine bone scan or MRI with and without contrast may be helpful. Electronically Signed   By: Ulyses Jarred M.D.   On: 02/20/2018 18:11    Ct Cervical Spine Wo Contrast  Result Date: 02/20/2018 CLINICAL DATA:  Fall with headache.  Possible loss of consciousness. EXAM: CT HEAD WITHOUT CONTRAST CT CERVICAL SPINE WITHOUT CONTRAST TECHNIQUE: Multidetector CT imaging of the head and cervical spine was performed following the standard protocol without intravenous contrast. Multiplanar CT image reconstructions of the cervical spine were also generated. COMPARISON:  Head CT 05/30/2017 FINDINGS: CT HEAD FINDINGS Brain: There is no mass, hemorrhage or extra-axial collection. The size and configuration of the ventricles and extra-axial CSF spaces are normal. Old right basal ganglia lacunar infarct. The brain parenchyma is normal. Vascular: No abnormal hyperdensity of the major intracranial arteries or dural venous sinuses. No intracranial atherosclerosis. Skull: The visualized skull base, calvarium and extracranial soft tissues are normal. Sinuses/Orbits: No fluid levels or advanced mucosal thickening of the visualized paranasal sinuses. No mastoid or middle ear effusion. The orbits are normal. CT CERVICAL SPINE FINDINGS Alignment: Reversal of normal cervical lordosis. No static subluxation. Lateral masses of C1 and C2 and the occipital condyles are aligned. Skull base and vertebrae: No acute fracture. There is sclerotic change with a thin zone of transition in the left T2 pedicle and transverse process. Soft tissues and spinal canal: No prevertebral fluid or swelling. No visible canal hematoma. Disc levels: Severe right C2-C4 facet hypertrophy with milder hypertrophy at these levels on the left. There is neural foraminal stenosis at right C2-3, bilateral C3-4, right C4-5. Upper chest: No pneumothorax, pulmonary nodule or pleural effusion. Other: Normal visualized paraspinal cervical soft tissues. IMPRESSION: 1. No acute intracranial abnormality. 2. Old right basal ganglia lacunar infarct. 3. No acute fracture or static subluxation of the cervical spine. 4.  Multilevel facet arthrosis contributing to neural foraminal stenosis at multiple upper cervical spine levels. 5. Sclerotic focus within the left T2 pedicle and transverse process without aggressive features. Benign ideology such as bone island/enostosis is favored. If the patient has any history of malignancy a sclerotic metastasis would be difficult to exclude. Non-emergent nuclear medicine bone scan or MRI with and without contrast may be helpful. Electronically Signed   By: Ulyses Jarred M.D.   On: 02/20/2018 18:11   Dg Chest Portable 1 View  Result Date: 02/20/2018 CLINICAL DATA:  Syncopal episode this morning sitting on a porch ledge, LEFT rib pain, donating plasma this morning, hypertension, prostate cancer, smoker EXAM: PORTABLE CHEST 1 VIEW COMPARISON:  Portable exam 1720 hours without priors for comparison FINDINGS: Upper normal heart size. Mediastinal contours and pulmonary vascularity normal. Bibasilar atelectasis. Mild elevation of LEFT diaphragm. No acute infiltrate, pleural  effusion, or pneumothorax. Bones demineralized with dextroconvex scoliosis and degenerative disc disease changes of the thoracic spine. Question old posttraumatic versus postsurgical deformity of the distal LEFT clavicle. IMPRESSION: Bibasilar atelectasis. Electronically Signed   By: Lavonia Dana M.D.   On: 02/20/2018 17:32    ____________________________________________   PROCEDURES  Procedure(s) performed:   Procedures  Critical Care performed:   ____________________________________________   INITIAL IMPRESSION / ASSESSMENT AND PLAN / ED COURSE  Patient with marked pain in the left chest with palpation and deep breathing.  No obvious rib fractures.  His EKG however is significantly changed from last month.  He had had syncope 2.  We will put him in the hospital and monitor him closely.      ____________________________________________   FINAL CLINICAL IMPRESSION(S) / ED DIAGNOSES  Final diagnoses:    Syncope and collapse  Abnormal EKG     ED Discharge Orders    None       Note:  This document was prepared using Dragon voice recognition software and may include unintentional dictation errors.    Nena Polio, MD 02/20/18 [phone]

## 2018-02-20 NOTE — ED Triage Notes (Signed)
Pt was sitting on his porch ledge and reportedly fell backwards, approximately 3 feet. C/o left rib area pain. Pt does not remember the fall. Family advised he was up and ambulatory after the fall. Pt donated plasma this morning, Unknow if he hit his head. Pt is A&Ox4.

## 2018-02-21 ENCOUNTER — Inpatient Hospital Stay: Payer: Medicare Other

## 2018-02-21 ENCOUNTER — Inpatient Hospital Stay: Payer: Medicare Other | Admitting: Oncology

## 2018-02-21 ENCOUNTER — Ambulatory Visit: Payer: Medicare Other | Admitting: Urology

## 2018-02-21 DIAGNOSIS — R55 Syncope and collapse: Secondary | ICD-10-CM | POA: Diagnosis not present

## 2018-02-21 DIAGNOSIS — R079 Chest pain, unspecified: Secondary | ICD-10-CM | POA: Diagnosis not present

## 2018-02-21 LAB — URINALYSIS, COMPLETE (UACMP) WITH MICROSCOPIC
BACTERIA UA: NONE SEEN
BILIRUBIN URINE: NEGATIVE
Glucose, UA: 50 mg/dL — AB
KETONES UR: NEGATIVE mg/dL
LEUKOCYTES UA: NEGATIVE
Nitrite: NEGATIVE
PH: 5 (ref 5.0–8.0)
Protein, ur: NEGATIVE mg/dL
Specific Gravity, Urine: 1.02 (ref 1.005–1.030)

## 2018-02-21 LAB — TROPONIN I: Troponin I: <0.03 ng/mL (ref <0.03)

## 2018-02-21 LAB — COMPREHENSIVE METABOLIC PANEL
ALT: 33 U/L (ref 0–44)
AST: 76 U/L — AB (ref 15–41)
Albumin: 3.1 g/dL — ABNORMAL LOW (ref 3.5–5.0)
Alkaline Phosphatase: 48 U/L (ref 38–126)
Anion gap: 5 (ref 5–15)
BUN: 31 mg/dL — ABNORMAL HIGH (ref 8–23)
CHLORIDE: 108 mmol/L (ref 98–111)
CO2: 28 mmol/L (ref 22–32)
Calcium: 8.7 mg/dL — ABNORMAL LOW (ref 8.9–10.3)
Creatinine, Ser: 1.56 mg/dL — ABNORMAL HIGH (ref 0.61–1.24)
GFR, EST AFRICAN AMERICAN: 52 mL/min — AB (ref >60)
GFR, EST NON AFRICAN AMERICAN: 45 mL/min — AB (ref >60)
Glucose, Bld: 130 mg/dL — ABNORMAL HIGH (ref 70–99)
POTASSIUM: 4.6 mmol/L (ref 3.5–5.1)
SODIUM: 141 mmol/L (ref 135–145)
Total Bilirubin: 0.5 mg/dL (ref 0.3–1.2)
Total Protein: 5.2 g/dL — ABNORMAL LOW (ref 6.5–8.1)

## 2018-02-21 LAB — MRSA PCR SCREENING: MRSA BY PCR: NEGATIVE

## 2018-02-21 MED ORDER — TRAMADOL HCL 50 MG PO TABS
50.0000 mg | ORAL_TABLET | Freq: Once | ORAL | Status: AC
  Administered 2018-02-21: 50 mg via ORAL
  Filled 2018-02-21: qty 1

## 2018-02-21 NOTE — Discharge Summary (Signed)
[facility] NAME: Terry Mitchell    MR#:  [id number]  DATE OF BIRTH:  [birth year]  DATE OF ADMISSION:  02/20/2018 ADMITTING PHYSICIAN: Sela Hua, MD  DATE OF DISCHARGE: 02/21/2018  PRIMARY CARE PHYSICIAN: [name], MD   ADMISSION DIAGNOSIS:  Syncope and collapse [R55] Abnormal EKG [R94.31]  DISCHARGE DIAGNOSIS:  Syncope secondary to multifactorial etiology Musculoskeletal chest pain Lower back pain  hypertension  SECONDARY DIAGNOSIS:   Past Medical History:  Diagnosis Date  . Depression    recently went on disability d/t diagnosis  . History of recent fall 01/2018   missed the last step on ladder, causing back discomfort  . Hypertension   . Prostate cancer ([place]) 12/2017   prostate     ADMITTING HISTORY 67 y.o. male with no apparent history of cardiovascular disease but does have some hypertension who has had some back surgeries and other issues for which show the patient has recently had a syncopal episode.  The patient did give plasma and then had some alcohol and was sitting on a porch thereafter.  At that time he became dizzy weak and fell over the porch into the bush and hurt his ribs and had chest discomfort.  This appears to be traumatic chest discomfort in nature and not due to any cardiovascular cause.  The syncope was likely secondary to vasovagal and/or dehydration from recent leg giving plasma as well as drinking significant amount of alcohol.  The patient has recovered well with a normal troponin and mild glomerular ultrafiltration rate abnormality possibly consistent with dehydration as well as an slightly abnormal EKG showing diffuse T wave inversions now completely resolved at this time.  The patient feels well this morning with no evidence of significant other symptoms and appears not to have any significant cardiovascular concerns  HOSPITAL COURSE:  Patient was admitted to telemetry.  Telemetry monitoring  was uneventful.  Serial troponins were negative.  Patient syncope probably vasovagal in etiology.  He was evaluated by cardiology who did not recommend any acute intervention or any further testing.  Patient was worked up with CT head, CT cervical spine and chest x-ray. patient hemodynamically stable will be discharged to home follow-up with primary care physician in the clinic.  CONSULTS OBTAINED:  Treatment Team:  Corey Skains, MD  DRUG ALLERGIES:   Allergies  Allergen Reactions  . Penicillins Hives    Hives, welts, swelling profusely . Pretty severe reaction Has patient had a PCN reaction causing immediate rash, facial/tongue/throat swelling, SOB or lightheadedness with hypotension: No Has patient had a PCN reaction causing severe rash involving mucus membranes or skin necrosis: Yes Has patient had a PCN reaction that required hospitalization: Yes Has patient had a PCN reaction occurring within the last 10 years: No If all of the above answers are "NO", then may proceed with Cephalosporin use.   . Lactose Intolerance (Gi)     Gi upset    DISCHARGE MEDICATIONS:   Allergies as of 02/21/2018      Reactions   Penicillins Hives   Hives, welts, swelling profusely . Pretty severe reaction Has patient had a PCN reaction causing immediate rash, facial/tongue/throat swelling, SOB or lightheadedness with hypotension: No Has patient had a PCN reaction causing severe rash involving mucus membranes or skin necrosis: Yes Has patient had a PCN reaction that required hospitalization: Yes Has patient had a PCN reaction occurring within the last 10 years: No If all of the  above answers are "NO", then may proceed with Cephalosporin use.   Lactose Intolerance (gi)    Gi upset      Medication List    TAKE these medications   losartan 100 MG tablet Commonly known as:  COZAAR Take 100 mg by mouth daily at 12 noon.   vitamin B-12 500 MCG tablet Commonly known as:  CYANOCOBALAMIN Take 500  mcg by mouth 3 (three) times a week.       Today  Patient seen today   No chest pain No palpitations No shortness of breath   VITAL SIGNS:  Blood pressure (!) 150/100, pulse 68, temperature 98.3 F (36.8 C), temperature source Oral, resp. rate 17, height 6' (1.829 m), weight 186 lb 11.2 oz (84.7 kg), SpO2 98 %.  I/O:    Intake/Output Summary (Last 24 hours) at 02/21/2018 1302 Last data filed at 02/21/2018 1025 Gross per 24 hour  Intake 240 ml  Output 590 ml  Net -350 ml    PHYSICAL EXAMINATION:  Physical Exam  GENERAL:  67 y.o.-year-old patient lying in the bed with no acute distress.  LUNGS: Normal breath sounds bilaterally, no wheezing, rales,rhonchi or crepitation. No use of accessory muscles of respiration.  CARDIOVASCULAR: S1, S2 normal. No murmurs, rubs, or gallops.  ABDOMEN: Soft, non-tender, non-distended. Bowel sounds present. No organomegaly or mass.  NEUROLOGIC: Moves all 4 extremities. PSYCHIATRIC: The patient is alert and oriented x 3.  SKIN: No obvious rash, lesion, or ulcer.   DATA REVIEW:   CBC Recent Labs  Lab 02/20/18 1708  WBC 8.3  HGB 14.3  HCT 42.5  PLT 121*    Chemistries  Recent Labs  Lab 02/21/18 0152  NA 141  K 4.6  CL 108  CO2 28  GLUCOSE 130*  BUN 31*  CREATININE 1.56*  CALCIUM 8.7*  AST 76*  ALT 33  ALKPHOS 48  BILITOT 0.5    Cardiac Enzymes Recent Labs  Lab 02/21/18 0152  TROPONINI <0.03    Microbiology Results  Results for orders placed or performed during the hospital encounter of 02/20/18  MRSA PCR Screening     Status: None   Collection Time: 02/21/18  1:45 AM  Result Value Ref Range Status   MRSA by PCR NEGATIVE NEGATIVE Final    Comment:        The GeneXpert MRSA Assay (FDA approved for NASAL specimens only), is one component of a comprehensive MRSA colonization surveillance program. It is not intended to diagnose MRSA infection nor to guide or monitor treatment for MRSA infections. Performed at  [facility], [address] [place], [place] [address]     RADIOLOGY:  Ct Head Wo Contrast  Result Date: 02/20/2018 CLINICAL DATA:  Fall with headache.  Possible loss of consciousness. EXAM: CT HEAD WITHOUT CONTRAST CT CERVICAL SPINE WITHOUT CONTRAST TECHNIQUE: Multidetector CT imaging of the head and cervical spine was performed following the standard protocol without intravenous contrast. Multiplanar CT image reconstructions of the cervical spine were also generated. COMPARISON:  Head CT 05/30/2017 FINDINGS: CT HEAD FINDINGS Brain: There is no mass, hemorrhage or extra-axial collection. The size and configuration of the ventricles and extra-axial CSF spaces are normal. Old right basal ganglia lacunar infarct. The brain parenchyma is normal. Vascular: No abnormal hyperdensity of the major intracranial arteries or dural venous sinuses. No intracranial atherosclerosis. Skull: The visualized skull base, calvarium and extracranial soft tissues are normal. Sinuses/Orbits: No fluid levels or advanced mucosal thickening of the visualized paranasal sinuses. No  mastoid or middle ear effusion. The orbits are normal. CT CERVICAL SPINE FINDINGS Alignment: Reversal of normal cervical lordosis. No static subluxation. Lateral masses of C1 and C2 and the occipital condyles are aligned. Skull base and vertebrae: No acute fracture. There is sclerotic change with a thin zone of transition in the left T2 pedicle and transverse process. Soft tissues and spinal canal: No prevertebral fluid or swelling. No visible canal hematoma. Disc levels: Severe right C2-C4 facet hypertrophy with milder hypertrophy at these levels on the left. There is neural foraminal stenosis at right C2-3, bilateral C3-4, right C4-5. Upper chest: No pneumothorax, pulmonary nodule or pleural effusion. Other: Normal visualized paraspinal cervical soft tissues. IMPRESSION: 1. No acute intracranial abnormality. 2. Old right basal ganglia lacunar  infarct. 3. No acute fracture or static subluxation of the cervical spine. 4. Multilevel facet arthrosis contributing to neural foraminal stenosis at multiple upper cervical spine levels. 5. Sclerotic focus within the left T2 pedicle and transverse process without aggressive features. Benign ideology such as bone island/enostosis is favored. If the patient has any history of malignancy a sclerotic metastasis would be difficult to exclude. Non-emergent nuclear medicine bone scan or MRI with and without contrast may be helpful. Electronically Signed   By: Ulyses Jarred M.D.   On: 02/20/2018 18:11   Ct Cervical Spine Wo Contrast  Result Date: 02/20/2018 CLINICAL DATA:  Fall with headache.  Possible loss of consciousness. EXAM: CT HEAD WITHOUT CONTRAST CT CERVICAL SPINE WITHOUT CONTRAST TECHNIQUE: Multidetector CT imaging of the head and cervical spine was performed following the standard protocol without intravenous contrast. Multiplanar CT image reconstructions of the cervical spine were also generated. COMPARISON:  Head CT 05/30/2017 FINDINGS: CT HEAD FINDINGS Brain: There is no mass, hemorrhage or extra-axial collection. The size and configuration of the ventricles and extra-axial CSF spaces are normal. Old right basal ganglia lacunar infarct. The brain parenchyma is normal. Vascular: No abnormal hyperdensity of the major intracranial arteries or dural venous sinuses. No intracranial atherosclerosis. Skull: The visualized skull base, calvarium and extracranial soft tissues are normal. Sinuses/Orbits: No fluid levels or advanced mucosal thickening of the visualized paranasal sinuses. No mastoid or middle ear effusion. The orbits are normal. CT CERVICAL SPINE FINDINGS Alignment: Reversal of normal cervical lordosis. No static subluxation. Lateral masses of C1 and C2 and the occipital condyles are aligned. Skull base and vertebrae: No acute fracture. There is sclerotic change with a thin zone of transition in the  left T2 pedicle and transverse process. Soft tissues and spinal canal: No prevertebral fluid or swelling. No visible canal hematoma. Disc levels: Severe right C2-C4 facet hypertrophy with milder hypertrophy at these levels on the left. There is neural foraminal stenosis at right C2-3, bilateral C3-4, right C4-5. Upper chest: No pneumothorax, pulmonary nodule or pleural effusion. Other: Normal visualized paraspinal cervical soft tissues. IMPRESSION: 1. No acute intracranial abnormality. 2. Old right basal ganglia lacunar infarct. 3. No acute fracture or static subluxation of the cervical spine. 4. Multilevel facet arthrosis contributing to neural foraminal stenosis at multiple upper cervical spine levels. 5. Sclerotic focus within the left T2 pedicle and transverse process without aggressive features. Benign ideology such as bone island/enostosis is favored. If the patient has any history of malignancy a sclerotic metastasis would be difficult to exclude. Non-emergent nuclear medicine bone scan or MRI with and without contrast may be helpful. Electronically Signed   By: Ulyses Jarred M.D.   On: 02/20/2018 18:11   Dg Chest Portable 1 View  Result Date: 02/20/2018 CLINICAL DATA:  Syncopal episode this morning sitting on a porch ledge, LEFT rib pain, donating plasma this morning, hypertension, prostate cancer, smoker EXAM: PORTABLE CHEST 1 VIEW COMPARISON:  Portable exam 1720 hours without priors for comparison FINDINGS: Upper normal heart size. Mediastinal contours and pulmonary vascularity normal. Bibasilar atelectasis. Mild elevation of LEFT diaphragm. No acute infiltrate, pleural effusion, or pneumothorax. Bones demineralized with dextroconvex scoliosis and degenerative disc disease changes of the thoracic spine. Question old posttraumatic versus postsurgical deformity of the distal LEFT clavicle. IMPRESSION: Bibasilar atelectasis. Electronically Signed   By: Lavonia Dana M.D.   On: 02/20/2018 17:32    Follow  up with PCP in 1 week.  Management plans discussed with the patient, family and they are in agreement.  CODE STATUS: Full code     Code Status Orders  (From admission, onward)        Start     Ordered   02/20/18 2130  Full code  Continuous     02/20/18 2129    Code Status History    This patient has a current code status but no historical code status.      TOTAL TIME TAKING CARE OF THIS PATIENT ON DAY OF DISCHARGE: more than 34 minutes.   Saundra Shelling M.D on 02/21/2018 at 1:02 PM  Between 7am to 6pm - Pager - [phone]  After 6pm go to [email or web address] - password [email or web address] [facility]  Office  [phone]  CC: Primary care physician; [name], MD  Note: This dictation was prepared with Dragon dictation along with smaller phrase technology. Any transcriptional errors that result from this process are unintentional.

## 2018-02-21 NOTE — Progress Notes (Signed)
Advanced care plan. Purpose of the Encounter: CODE STATUS Parties in Attendance: Patient Patient's Decision Capacity: Good Subjective/Patient's story: Presented to emergency room for syncope Objective/Medical story Syncope multifactorial etiology Needs work-up Goals of care determination:  Advance care directives goals of care discussed patient wants everything done for now which includes CPR, intubation and ventilator if the need arises CODE STATUS: Full code Time spent discussing advanced care planning: 16 minutes

## 2018-02-21 NOTE — Consult Note (Signed)
[facility] Cardiology Consultation Note  Patient ID: Terry Mitchell, MRN: [id number], DOB/AGE: [birth year] 67 y.o. Admit date: 02/20/2018   Date of Consult: 02/21/2018 Primary Physician: [name], MD Primary Cardiologist: None  Chief Complaint:  Chief Complaint  Patient presents with  . Loss of Consciousness   Reason for Consult: Chest pain  HPI: 67 y.o. male with no apparent history of cardiovascular disease but does have some hypertension who has had some back surgeries and other issues for which show the patient has recently had a syncopal episode.  The patient did give plasma and then had some alcohol and was sitting on a porch thereafter.  At that time he became dizzy weak and fell over the porch into the bush and hurt his ribs and had chest discomfort.  This appears to be traumatic chest discomfort in nature and not due to any cardiovascular cause.  The syncope was likely secondary to vasovagal and/or dehydration from recent leg giving plasma as well as drinking significant amount of alcohol.  The patient has recovered well with a normal troponin and mild glomerular ultrafiltration rate abnormality possibly consistent with dehydration as well as an slightly abnormal EKG showing diffuse T wave inversions now completely resolved at this time.  The patient feels well this morning with no evidence of significant other symptoms and appears not to have any significant cardiovascular concerns  Past Medical History:  Diagnosis Date  . Depression    recently went on disability d/t diagnosis  . History of recent fall 01/2018   missed the last step on ladder, causing back discomfort  . Hypertension   . Prostate cancer ([place]) 12/2017   prostate      Surgical History:  Past Surgical History:  Procedure Laterality Date  . BACK SURGERY  2001    2 rods and 6 screws in back  . Left shoulder surgery Left 1998   removed a piece of bone around collar bone     Home Meds: Prior to  Admission medications   Medication Sig Start Date End Date Taking? Authorizing Provider  losartan (COZAAR) 100 MG tablet Take 100 mg by mouth daily at 12 noon.    Yes [provider]  vitamin B-12 (CYANOCOBALAMIN) 500 MCG tablet Take 500 mcg by mouth 3 (three) times a week.   Yes [provider]    Inpatient Medications:  . enoxaparin (LOVENOX) injection  40 mg Subcutaneous Q24H  . folic acid  1 mg Oral Daily  . metoprolol tartrate  25 mg Oral BID  . multivitamin with minerals  1 tablet Oral Daily  . thiamine  100 mg Oral Daily   Or  . thiamine  100 mg Intravenous Daily  . vitamin B-12  500 mcg Oral Once per day on Mon Wed Fri     Allergies:  Allergies  Allergen Reactions  . Penicillins Hives    Hives, welts, swelling profusely . Pretty severe reaction Has patient had a PCN reaction causing immediate rash, facial/tongue/throat swelling, SOB or lightheadedness with hypotension: No Has patient had a PCN reaction causing severe rash involving mucus membranes or skin necrosis: Yes Has patient had a PCN reaction that required hospitalization: Yes Has patient had a PCN reaction occurring within the last 10 years: No If all of the above answers are "NO", then may proceed with Cephalosporin use.   . Lactose Intolerance (Gi)     Gi upset    Social History   Socioeconomic History  . Marital status:  Single    Spouse name: Not on file  . Number of children: 4  . Years of education: Not on file  . Highest education level: Not on file  Occupational History  . Occupation: disability  Social Needs  . Financial resource strain: Very hard  . Food insecurity:    Worry: Sometimes true    Inability: Often true  . Transportation needs:    Medical: No    Non-medical: Yes  Tobacco Use  . Smoking status: Current Every Day Smoker    Packs/day: 2.00    Types: Cigars  . Smokeless tobacco: Never Used  . Tobacco comment: 2 cigars  Substance and Sexual Activity  . Alcohol  use: Yes    Alcohol/week: 1.2 oz    Types: 2 Cans of beer per week    Comment: occasional  . Drug use: Yes    Types: Cocaine    Comment: rarely uses but did so in last month  . Sexual activity: Not Currently  Lifestyle  . Physical activity:    Days per week: 7 days    Minutes per session: 60 min  . Stress: Rather much  Relationships  . Social connections:    Talks on phone: Not on file    Gets together: Not on file    Attends religious service: Not on file    Active member of club or organization: Not on file    Attends meetings of clubs or organizations: Not on file    Relationship status: Not on file  . Intimate partner violence:    Fear of current or ex partner: Not on file    Emotionally abused: Not on file    Physically abused: Not on file    Forced sexual activity: Not on file  Other Topics Concern  . Not on file  Social History Narrative  . Not on file     Family History  Problem Relation Age of Onset  . Stroke Mother   . Kidney failure Mother   . Diabetes Maternal Aunt      Review of Systems Positive for chest pain Negative for: General:  chills, fever, night sweats or weight changes.  Cardiovascular: PND orthopnea syncope dizziness  Dermatological skin lesions rashes Respiratory: Cough congestion Urologic: Frequent urination urination at night and hematuria Abdominal: negative for nausea, vomiting, diarrhea, bright red blood per rectum, melena, or hematemesis Neurologic: negative for visual changes, and/or hearing changes  All other systems reviewed and are otherwise negative except as noted above.  Labs: Recent Labs    02/20/18 1708 02/20/18 2254 02/21/18 0152  TROPONINI <0.03 <0.03 <0.03   Lab Results  Component Value Date   WBC 8.3 02/20/2018   HGB 14.3 02/20/2018   HCT 42.5 02/20/2018   MCV 99.6 02/20/2018   PLT 121 (L) 02/20/2018    Recent Labs  Lab 02/21/18 0152  NA 141  K 4.6  CL 108  CO2 28  BUN 31*  CREATININE 1.56*   CALCIUM 8.7*  PROT 5.2*  BILITOT 0.5  ALKPHOS 48  ALT 33  AST 76*  GLUCOSE 130*   No results found for: CHOL, HDL, LDLCALC, TRIG No results found for: DDIMER  Radiology/Studies:  Ct Head Wo Contrast  Result Date: 02/20/2018 CLINICAL DATA:  Fall with headache.  Possible loss of consciousness. EXAM: CT HEAD WITHOUT CONTRAST CT CERVICAL SPINE WITHOUT CONTRAST TECHNIQUE: Multidetector CT imaging of the head and cervical spine was performed following the standard protocol without intravenous contrast. Multiplanar CT image  reconstructions of the cervical spine were also generated. COMPARISON:  Head CT 05/30/2017 FINDINGS: CT HEAD FINDINGS Brain: There is no mass, hemorrhage or extra-axial collection. The size and configuration of the ventricles and extra-axial CSF spaces are normal. Old right basal ganglia lacunar infarct. The brain parenchyma is normal. Vascular: No abnormal hyperdensity of the major intracranial arteries or dural venous sinuses. No intracranial atherosclerosis. Skull: The visualized skull base, calvarium and extracranial soft tissues are normal. Sinuses/Orbits: No fluid levels or advanced mucosal thickening of the visualized paranasal sinuses. No mastoid or middle ear effusion. The orbits are normal. CT CERVICAL SPINE FINDINGS Alignment: Reversal of normal cervical lordosis. No static subluxation. Lateral masses of C1 and C2 and the occipital condyles are aligned. Skull base and vertebrae: No acute fracture. There is sclerotic change with a thin zone of transition in the left T2 pedicle and transverse process. Soft tissues and spinal canal: No prevertebral fluid or swelling. No visible canal hematoma. Disc levels: Severe right C2-C4 facet hypertrophy with milder hypertrophy at these levels on the left. There is neural foraminal stenosis at right C2-3, bilateral C3-4, right C4-5. Upper chest: No pneumothorax, pulmonary nodule or pleural effusion. Other: Normal visualized paraspinal  cervical soft tissues. IMPRESSION: 1. No acute intracranial abnormality. 2. Old right basal ganglia lacunar infarct. 3. No acute fracture or static subluxation of the cervical spine. 4. Multilevel facet arthrosis contributing to neural foraminal stenosis at multiple upper cervical spine levels. 5. Sclerotic focus within the left T2 pedicle and transverse process without aggressive features. Benign ideology such as bone island/enostosis is favored. If the patient has any history of malignancy a sclerotic metastasis would be difficult to exclude. Non-emergent nuclear medicine bone scan or MRI with and without contrast may be helpful. Electronically Signed   By: Ulyses Jarred M.D.   On: 02/20/2018 18:11   Ct Cervical Spine Wo Contrast  Result Date: 02/20/2018 CLINICAL DATA:  Fall with headache.  Possible loss of consciousness. EXAM: CT HEAD WITHOUT CONTRAST CT CERVICAL SPINE WITHOUT CONTRAST TECHNIQUE: Multidetector CT imaging of the head and cervical spine was performed following the standard protocol without intravenous contrast. Multiplanar CT image reconstructions of the cervical spine were also generated. COMPARISON:  Head CT 05/30/2017 FINDINGS: CT HEAD FINDINGS Brain: There is no mass, hemorrhage or extra-axial collection. The size and configuration of the ventricles and extra-axial CSF spaces are normal. Old right basal ganglia lacunar infarct. The brain parenchyma is normal. Vascular: No abnormal hyperdensity of the major intracranial arteries or dural venous sinuses. No intracranial atherosclerosis. Skull: The visualized skull base, calvarium and extracranial soft tissues are normal. Sinuses/Orbits: No fluid levels or advanced mucosal thickening of the visualized paranasal sinuses. No mastoid or middle ear effusion. The orbits are normal. CT CERVICAL SPINE FINDINGS Alignment: Reversal of normal cervical lordosis. No static subluxation. Lateral masses of C1 and C2 and the occipital condyles are aligned.  Skull base and vertebrae: No acute fracture. There is sclerotic change with a thin zone of transition in the left T2 pedicle and transverse process. Soft tissues and spinal canal: No prevertebral fluid or swelling. No visible canal hematoma. Disc levels: Severe right C2-C4 facet hypertrophy with milder hypertrophy at these levels on the left. There is neural foraminal stenosis at right C2-3, bilateral C3-4, right C4-5. Upper chest: No pneumothorax, pulmonary nodule or pleural effusion. Other: Normal visualized paraspinal cervical soft tissues. IMPRESSION: 1. No acute intracranial abnormality. 2. Old right basal ganglia lacunar infarct. 3. No acute fracture or static subluxation of  the cervical spine. 4. Multilevel facet arthrosis contributing to neural foraminal stenosis at multiple upper cervical spine levels. 5. Sclerotic focus within the left T2 pedicle and transverse process without aggressive features. Benign ideology such as bone island/enostosis is favored. If the patient has any history of malignancy a sclerotic metastasis would be difficult to exclude. Non-emergent nuclear medicine bone scan or MRI with and without contrast may be helpful. Electronically Signed   By: Ulyses Jarred M.D.   On: 02/20/2018 18:11   Dg Chest Portable 1 View  Result Date: 02/20/2018 CLINICAL DATA:  Syncopal episode this morning sitting on a porch ledge, LEFT rib pain, donating plasma this morning, hypertension, prostate cancer, smoker EXAM: PORTABLE CHEST 1 VIEW COMPARISON:  Portable exam 1720 hours without priors for comparison FINDINGS: Upper normal heart size. Mediastinal contours and pulmonary vascularity normal. Bibasilar atelectasis. Mild elevation of LEFT diaphragm. No acute infiltrate, pleural effusion, or pneumothorax. Bones demineralized with dextroconvex scoliosis and degenerative disc disease changes of the thoracic spine. Question old posttraumatic versus postsurgical deformity of the distal LEFT clavicle.  IMPRESSION: Bibasilar atelectasis. Electronically Signed   By: Lavonia Dana M.D.   On: 02/20/2018 17:32    EKG: Normal sinus rhythm with nonspecific anterior precordial T wave inversion  Weights: Filed Weights   02/20/18 1700 02/20/18 2131  Weight: 190 lb (86.2 kg) 186 lb 11.2 oz (84.7 kg)     Physical Exam: Blood pressure (!) 150/100, pulse (!) 55, temperature 98.3 F (36.8 C), temperature source Oral, resp. rate 17, height 6' (1.829 m), weight 186 lb 11.2 oz (84.7 kg), SpO2 98 %. Body mass index is 25.32 kg/m. General: Well developed, well nourished, in no acute distress. Head eyes ears nose throat: Normocephalic, atraumatic, sclera non-icteric, no xanthomas, nares are without discharge. No apparent thyromegaly and/or mass  Lungs: Normal respiratory effort.  no wheezes, no rales, no rhonchi.  Heart: RRR with normal S1 S2. no murmur gallop, no rub, PMI is normal size and placement, carotid upstroke normal without bruit, jugular venous pressure is normal Abdomen: Soft, non-tender, non-distended with normoactive bowel sounds. No hepatomegaly. No rebound/guarding. No obvious abdominal masses. Abdominal aorta is normal size without bruit Extremities: No edema. no cyanosis, no clubbing, no ulcers  Peripheral : 2+ bilateral upper extremity pulses, 2+ bilateral femoral pulses, 2+ bilateral dorsal pedal pulse Neuro: Alert and oriented. No facial asymmetry. No focal deficit. Moves all extremities spontaneously. Musculoskeletal: Normal muscle tone without kyphosis Psych:  Responds to questions appropriately with a normal affect.    Assessment: 67 year old male with chronic kidney disease essential hypertension with a syncopal episode multifactorial in nature without evidence of myocardial infarction improved at this time  Plan: 1.  Continue supportive care of syncopal episode which appears to be situational at this time and has no apparent primary cardiac cause 2.  Continue antihypertensive  medication management of before including metoprolol and/or inhibitor if necessary 3.  Begin ambulation and follow for improvements of symptoms and concerns for chest discomfort shortness of breath 4.  No further cardiac diagnostics necessary at this time  Signed, Corey Skains M.D. [facility] 02/21/2018, 8:15 AM

## 2018-02-21 NOTE — Plan of Care (Signed)

## 2018-02-22 LAB — HIV ANTIBODY (ROUTINE TESTING W REFLEX): HIV Screen 4th Generation wRfx: NONREACTIVE

## 2018-02-24 NOTE — Pre-Procedure Instructions (Signed)
CALL FROM AMY AT DR STOIOFF'S, PATIENT INPATIENT 02/20/18 AND PLT COUNT 122,000.Marland Kitchen DOES NOT NEED FURTHER HEMATOLOGY WORKUP

## 2018-02-25 NOTE — Telephone Encounter (Signed)
Dr Bernardo Heater aware of increased platelet level of 121 during recent hospital admission. Ok to proceed with prostate biopsy in OR on 02/28/2018.

## 2018-02-27 NOTE — Progress Notes (Signed)
Pharmacy Antibiotic Note  Terry Mitchell is a 67 y.o. male admitted on 02/27/18 with  surgical prophylaxis for a prostate biopsy.  Pharmacy has been consulted for gentamicin dosing. For genitourinary surgeries the recommended dose is 1.5mg /kg as a single dose  Stage manager. (2013). ASHP therapeutic guidelines on clinical practice guidelines for antimicrobial prophylaxis in surgery. [place]. [id number].)  Plan: Gentamicin 120 mg IV as a single prophylactic dose  No data recorded.  Recent Labs  Lab 02/20/18 1708 02/21/18 0152  WBC 8.3  --   CREATININE 1.67* 1.56*    Estimated Creatinine Clearance: 51.8 mL/min (A) (by C-G formula based on SCr of 1.56 mg/dL (H)).    Allergies  Allergen Reactions  . Penicillins Hives    Hives, welts, swelling profusely . Pretty severe reaction Has patient had a PCN reaction causing immediate rash, facial/tongue/throat swelling, SOB or lightheadedness with hypotension: No Has patient had a PCN reaction causing severe rash involving mucus membranes or skin necrosis: Yes Has patient had a PCN reaction that required hospitalization: Yes Has patient had a PCN reaction occurring within the last 10 years: No If all of the above answers are "NO", then may proceed with Cephalosporin use.   . Lactose Intolerance (Gi)     Gi upset    Antimicrobials this admission: Gentamicin 8/8 x1  Microbiology results: 8/2 MRSA PCR: neg  Thank you for allowing pharmacy to be a part of this patient's care.  Dallie Piles, PharmD 02/27/2018 2:05 PM

## 2018-02-28 ENCOUNTER — Other Ambulatory Visit: Payer: Self-pay

## 2018-02-28 ENCOUNTER — Encounter
Admission: RE | Disposition: A | Discharge status: Home / Self Care | Payer: Self-pay | Source: Ambulatory Visit | Attending: Urology

## 2018-02-28 ENCOUNTER — Ambulatory Visit
Admission: RE | Admit: 2018-02-28 | Discharge: 2018-02-28 | Disposition: A | Discharge status: Home / Self Care | Payer: Medicare Other | Source: Ambulatory Visit | Attending: Urology | Admitting: Urology

## 2018-02-28 ENCOUNTER — Ambulatory Visit: Payer: Medicare Other | Admitting: Anesthesiology

## 2018-02-28 DIAGNOSIS — C61 Malignant neoplasm of prostate: Secondary | ICD-10-CM | POA: Diagnosis present

## 2018-02-28 DIAGNOSIS — Z79899 Other long term (current) drug therapy: Secondary | ICD-10-CM | POA: Diagnosis not present

## 2018-02-28 DIAGNOSIS — F17210 Nicotine dependence, cigarettes, uncomplicated: Secondary | ICD-10-CM | POA: Insufficient documentation

## 2018-02-28 DIAGNOSIS — E739 Lactose intolerance, unspecified: Secondary | ICD-10-CM | POA: Insufficient documentation

## 2018-02-28 DIAGNOSIS — I10 Essential (primary) hypertension: Secondary | ICD-10-CM | POA: Insufficient documentation

## 2018-02-28 DIAGNOSIS — Z880 Allergy status to penicillin: Secondary | ICD-10-CM | POA: Insufficient documentation

## 2018-02-28 DIAGNOSIS — R972 Elevated prostate specific antigen [PSA]: Secondary | ICD-10-CM

## 2018-02-28 HISTORY — PX: PROSTATE BIOPSY: SHX241

## 2018-02-28 HISTORY — PX: TRANSRECTAL ULTRASOUND: SHX5146

## 2018-02-28 LAB — URINE DRUG SCREEN, QUALITATIVE (ARMC ONLY)
Amphetamines, Ur Screen: NOT DETECTED
BARBITURATES, UR SCREEN: NOT DETECTED
CANNABINOID 50 NG, UR ~~LOC~~: NOT DETECTED
Cocaine Metabolite,Ur ~~LOC~~: NOT DETECTED
MDMA (Ecstasy)Ur Screen: NOT DETECTED
METHADONE SCREEN, URINE: NOT DETECTED
OPIATE, UR SCREEN: NOT DETECTED
Phencyclidine (PCP) Ur S: NOT DETECTED
Tricyclic, Ur Screen: NOT DETECTED

## 2018-02-28 SURGERY — BIOPSY, PROSTATE
Anesthesia: General | Site: Prostate | Wound class: "Clean Contaminated "

## 2018-02-28 MED ORDER — FAMOTIDINE 20 MG PO TABS
ORAL_TABLET | ORAL | Status: AC
  Administered 2018-02-28: 20 mg via ORAL
  Filled 2018-02-28: qty 1

## 2018-02-28 MED ORDER — PROMETHAZINE HCL 25 MG/ML IJ SOLN: 6.2500 mg | INTRAMUSCULAR | Status: DC | PRN

## 2018-02-28 MED ORDER — EPHEDRINE SULFATE 50 MG/ML IJ SOLN
INTRAMUSCULAR | Status: DC | PRN
  Administered 2018-02-28: 10 mg via INTRAVENOUS

## 2018-02-28 MED ORDER — OXYCODONE HCL 5 MG/5ML PO SOLN: 5.0000 mg | Freq: Once | ORAL | Status: DC | PRN

## 2018-02-28 MED ORDER — MEPERIDINE HCL 50 MG/ML IJ SOLN: 6.2500 mg | INTRAMUSCULAR | Status: DC | PRN

## 2018-02-28 MED ORDER — FAMOTIDINE 20 MG PO TABS
20.0000 mg | ORAL_TABLET | Freq: Once | ORAL | Status: AC
  Administered 2018-02-28: 20 mg via ORAL

## 2018-02-28 MED ORDER — OXYCODONE HCL 5 MG PO TABS: 5.0000 mg | ORAL_TABLET | Freq: Once | ORAL | Status: DC | PRN

## 2018-02-28 MED ORDER — GENTAMICIN SULFATE 40 MG/ML IJ SOLN
120.0000 mg | Freq: Once | INTRAVENOUS | Status: AC
  Administered 2018-02-28: 120 mg via INTRAVENOUS
  Filled 2018-02-28: qty 3

## 2018-02-28 MED ORDER — LACTATED RINGERS IV SOLN
INTRAVENOUS | Status: DC
  Administered 2018-02-28: 07:00:00 via INTRAVENOUS

## 2018-02-28 MED ORDER — MIDAZOLAM HCL 5 MG/5ML IJ SOLN
INTRAMUSCULAR | Status: DC | PRN
  Administered 2018-02-28: 2 mg via INTRAVENOUS

## 2018-02-28 MED ORDER — FENTANYL CITRATE (PF) 100 MCG/2ML IJ SOLN
INTRAMUSCULAR | Status: DC | PRN
  Administered 2018-02-28: 50 ug via INTRAVENOUS

## 2018-02-28 MED ORDER — EPHEDRINE SULFATE 50 MG/ML IJ SOLN
INTRAMUSCULAR | Status: AC
  Filled 2018-02-28: qty 1

## 2018-02-28 MED ORDER — PROPOFOL 10 MG/ML IV BOLUS
INTRAVENOUS | Status: AC
  Filled 2018-02-28: qty 20

## 2018-02-28 MED ORDER — LIDOCAINE 2% (20 MG/ML) 5 ML SYRINGE
INTRAMUSCULAR | Status: DC | PRN
  Administered 2018-02-28: 50 mg via INTRAVENOUS

## 2018-02-28 MED ORDER — FENTANYL CITRATE (PF) 100 MCG/2ML IJ SOLN
INTRAMUSCULAR | Status: AC
  Filled 2018-02-28: qty 2

## 2018-02-28 MED ORDER — GLYCOPYRROLATE 0.2 MG/ML IJ SOLN
INTRAMUSCULAR | Status: AC
  Filled 2018-02-28: qty 1

## 2018-02-28 MED ORDER — FENTANYL CITRATE (PF) 100 MCG/2ML IJ SOLN: 25.0000 ug | INTRAMUSCULAR | Status: DC | PRN

## 2018-02-28 MED ORDER — GLYCOPYRROLATE 0.2 MG/ML IJ SOLN
INTRAMUSCULAR | Status: DC | PRN
  Administered 2018-02-28: 0.2 mg via INTRAVENOUS

## 2018-02-28 MED ORDER — PROPOFOL 500 MG/50ML IV EMUL
INTRAVENOUS | Status: DC | PRN
  Administered 2018-02-28: 75 ug/kg/min via INTRAVENOUS

## 2018-02-28 MED ORDER — LIDOCAINE HCL (PF) 2 % IJ SOLN
INTRAMUSCULAR | Status: AC
Start: 2018-02-28 — End: ?
  Filled 2018-02-28: qty 10

## 2018-02-28 MED ORDER — MIDAZOLAM HCL 2 MG/2ML IJ SOLN
INTRAMUSCULAR | Status: AC
  Filled 2018-02-28: qty 2

## 2018-02-28 SURGICAL SUPPLY — 19 items
BAG URINE DRAINAGE (UROLOGICAL SUPPLIES) ×1 IMPLANT
BLADE CLIPPER SURG (BLADE) ×1 IMPLANT
DRAPE SHEET LG 3/4 BI-LAMINATE (DRAPES) ×3 IMPLANT
DRSG TELFA 3X8 NADH (GAUZE/BANDAGES/DRESSINGS) ×3 IMPLANT
DRSG TELFA 4X3 1S NADH ST (GAUZE/BANDAGES/DRESSINGS) ×3 IMPLANT
GAUZE SPONGE 4X4 12PLY STRL (GAUZE/BANDAGES/DRESSINGS) ×3 IMPLANT
GLOVE BIO SURGEON STRL SZ8 (GLOVE) ×3 IMPLANT
GOWN STRL REUS W/ TWL XL LVL3 (GOWN DISPOSABLE) ×1 IMPLANT
GOWN STRL REUS W/TWL XL LVL3 (GOWN DISPOSABLE) ×2
GUIDE NDL ENDOCAV 16-18 CVR (NEEDLE) ×1 IMPLANT
GUIDE NEEDLE ENDOCAV 16-18 CVR (NEEDLE) ×3 IMPLANT
INST BIOPSY MAXCORE 18GX25 (NEEDLE) ×3 IMPLANT
KIT TURNOVER CYSTO (KITS) ×3 IMPLANT
NDL GUIDE BIOPSY 644068 (NEEDLE) ×1 IMPLANT
NEEDLE GUIDE BIOPSY 644068 (NEEDLE) ×3 IMPLANT
PAD DRESSING TELFA 3X8 NADH (GAUZE/BANDAGES/DRESSINGS) ×1 IMPLANT
SYR 10ML LL (SYRINGE) ×3 IMPLANT
SYRINGE IRR TOOMEY STRL 70CC (SYRINGE) ×1 IMPLANT
WATER STERILE IRR 1000ML POUR (IV SOLUTION) ×3 IMPLANT

## 2018-02-28 NOTE — Op Note (Signed)
Preoperative diagnosis:  1. Prostate cancer  Postoperative diagnosis:  1. Prostate cancer  Procedure: 1. Transrectal ultrasound prostate 2. Transrectal prostate biopsy  Surgeon: Abbie Sons, MD  Anesthesia: MAC  Complications: None  Intraoperative findings:  TRUS: Prostate volume calculated 38 g.  Moderate transition zone enlargement.  Scattered hypoechoic areas peripheral zone  EBL: Minimal  Specimens: None  Indication: Terry Mitchell is a 67 y.o. patient who was seen in our office on 01/01/2018 with a PSA of 72 and found to have a diffusely firm, nodular prostate on DRE.  He was felt to have clinical prostate cancer and was given a dose of Firmagon at that visit.  Prostate biopsy was scheduled in the office on 02/03/2018 however he could not tolerate DRE preprocedure and was scheduled for biopsy under sedation.  After reviewing the management options for treatment, he elected to proceed with the above surgical procedure(s). We have discussed the potential benefits and risks of the procedure, side effects of the proposed treatment, the likelihood of the patient achieving the goals of the procedure, and any potential problems that might occur during the procedure or recuperation. Informed consent has been obtained.  Description of procedure:  The patient was taken to the operating room and sedation was obtained by anesthesia.  The patient was placed in the left lateral decubitus position, and preoperative antibiotics were administered. A preoperative time-out was performed.   A transrectal ultrasound probe was lubricated and passed per rectum.  The prostate was imaged with findings as described above.  Standard 12 core biopsies were then performed without complication.  No significant bleeding was noted.  He was transported to PACU in stable condition.  Disposition: He is scheduled for staging procedures including CT abdomen/pelvis and bone scan.    Abbie Sons,  M.D.

## 2018-02-28 NOTE — Anesthesia Post-op Follow-up Note (Signed)
Anesthesia QCDR form completed.        

## 2018-02-28 NOTE — Discharge Instructions (Signed)

## 2018-02-28 NOTE — Anesthesia Preprocedure Evaluation (Signed)
Anesthesia Evaluation  Patient identified by MRN, date of birth, ID band Patient awake    Reviewed: Allergy & Precautions, NPO status , Patient's Chart, lab work & pertinent test results  History of Anesthesia Complications Negative for: history of anesthetic complications  Airway Mallampati: II  TM Distance: >3 FB Neck ROM: Full    Dental  (+) Poor Dentition, Missing   Pulmonary neg sleep apnea, neg COPD, Current Smoker,    breath sounds clear to auscultation- rhonchi (-) wheezing      Cardiovascular Exercise Tolerance: Good hypertension, Pt. on medications (-) CAD, (-) Past MI, (-) Cardiac Stents and (-) CABG  Rhythm:Regular Rate:Normal - Systolic murmurs and - Diastolic murmurs    Neuro/Psych PSYCHIATRIC DISORDERS Depression negative neurological ROS     GI/Hepatic negative GI ROS, Neg liver ROS,   Endo/Other  negative endocrine ROSneg diabetes  Renal/GU negative Renal ROS     Musculoskeletal negative musculoskeletal ROS (+)   Abdominal (+) - obese,   Peds  Hematology negative hematology ROS (+)   Anesthesia Other Findings Past Medical History: No date: Depression     Comment:  recently went on disability d/t diagnosis 01/2018: History of recent fall     Comment:  missed the last step on ladder, causing back discomfort No date: Hypertension 12/2017: Prostate cancer ([place])     Comment:  prostate   Reproductive/Obstetrics                             Anesthesia Physical Anesthesia Plan  ASA: II  Anesthesia Plan: General   Post-op Pain Management:    Induction: Intravenous  PONV Risk Score and Plan: 0 and Propofol infusion  Airway Management Planned: Natural Airway  Additional Equipment:   Intra-op Plan:   Post-operative Plan:   Informed Consent: I have reviewed the patients History and Physical, chart, labs and discussed the procedure including the risks, benefits and  alternatives for the proposed anesthesia with the patient or authorized representative who has indicated his/her understanding and acceptance.   Dental advisory given  Plan Discussed with: CRNA and Anesthesiologist  Anesthesia Plan Comments:         Anesthesia Quick Evaluation

## 2018-02-28 NOTE — Anesthesia Postprocedure Evaluation (Signed)
Anesthesia Post Note  Patient: Terry Mitchell  Procedure(s) Performed: PROSTATE BIOPSY (N/A Prostate) TRANSRECTAL ULTRASOUND (N/A Prostate)  Patient location during evaluation: PACU Anesthesia Type: General Level of consciousness: awake and alert and oriented Pain management: pain level controlled Vital Signs Assessment: post-procedure vital signs reviewed and stable Respiratory status: spontaneous breathing, nonlabored ventilation and respiratory function stable Cardiovascular status: blood pressure returned to baseline and stable Postop Assessment: no signs of nausea or vomiting Anesthetic complications: no     Last Vitals:  Vitals:   02/28/18 0801 02/28/18 0824  BP: (!) 87/49   Pulse: 62 62  Resp: 17 11  Temp: (!) 36.2 C   SpO2: 100% 100%    Last Pain:  Vitals:   02/28/18 0801  TempSrc:   PainSc: 0-No pain                 Marlene Beidler

## 2018-02-28 NOTE — Interval H&P Note (Signed)
History and Physical Interval Note:  02/28/2018 7:14 AM  Terry Mitchell  has presented today for surgery, with the diagnosis of elevated PSA  The various methods of treatment have been discussed with the patient and family. After consideration of risks, benefits and other options for treatment, the patient has consented to  Procedure(s): PROSTATE BIOPSY (N/A) TRANSRECTAL ULTRASOUND (N/A) as a surgical intervention .  The patient's history has been reviewed, patient examined, no change in status, stable for surgery.  I have reviewed the patient's chart and labs.  Questions were answered to the patient's satisfaction.     [place]

## 2018-02-28 NOTE — Transfer of Care (Signed)
Immediate Anesthesia Transfer of Care Note  Patient: Terry Mitchell  Procedure(s) Performed: PROSTATE BIOPSY (N/A Prostate) TRANSRECTAL ULTRASOUND (N/A Prostate)  Patient Location: PACU  Anesthesia Type:General  Level of Consciousness: awake  Airway & Oxygen Therapy: Patient Spontanous Breathing and Patient connected to face mask oxygen  Post-op Assessment: Report given to RN and Post -op Vital signs reviewed and stable  Post vital signs: Reviewed  Last Vitals:  Vitals Value Taken Time  BP 87/49 02/28/2018  8:03 AM  Temp    Pulse 62 02/28/2018  8:03 AM  Resp 16 02/28/2018  8:03 AM  SpO2 100 % 02/28/2018  8:03 AM  Vitals shown include unvalidated device data.  Last Pain:  Vitals:   02/28/18 0637  TempSrc: Oral  PainSc: 4          Complications: No apparent anesthesia complications

## 2018-03-03 LAB — SURGICAL PATHOLOGY

## 2018-03-05 ENCOUNTER — Encounter: Payer: Self-pay | Admitting: Oncology

## 2018-03-05 ENCOUNTER — Inpatient Hospital Stay: Payer: Medicare Other

## 2018-03-05 ENCOUNTER — Other Ambulatory Visit: Payer: Self-pay

## 2018-03-05 ENCOUNTER — Inpatient Hospital Stay: Payer: Medicare Other | Attending: Oncology | Admitting: Oncology

## 2018-03-05 VITALS — BP 131/81 | HR 55 | Temp 96.0°F | Wt 189.0 lb

## 2018-03-05 DIAGNOSIS — C774 Secondary and unspecified malignant neoplasm of inguinal and lower limb lymph nodes: Secondary | ICD-10-CM | POA: Diagnosis not present

## 2018-03-05 DIAGNOSIS — R232 Flushing: Secondary | ICD-10-CM | POA: Diagnosis not present

## 2018-03-05 DIAGNOSIS — Z79818 Long term (current) use of other agents affecting estrogen receptors and estrogen levels: Secondary | ICD-10-CM | POA: Insufficient documentation

## 2018-03-05 DIAGNOSIS — R972 Elevated prostate specific antigen [PSA]: Secondary | ICD-10-CM

## 2018-03-05 DIAGNOSIS — N189 Chronic kidney disease, unspecified: Secondary | ICD-10-CM | POA: Diagnosis not present

## 2018-03-05 DIAGNOSIS — C61 Malignant neoplasm of prostate: Secondary | ICD-10-CM | POA: Diagnosis present

## 2018-03-05 DIAGNOSIS — F17290 Nicotine dependence, other tobacco product, uncomplicated: Secondary | ICD-10-CM | POA: Diagnosis not present

## 2018-03-05 DIAGNOSIS — R9721 Rising PSA following treatment for malignant neoplasm of prostate: Secondary | ICD-10-CM | POA: Diagnosis not present

## 2018-03-05 DIAGNOSIS — Z79899 Other long term (current) drug therapy: Secondary | ICD-10-CM | POA: Diagnosis not present

## 2018-03-05 DIAGNOSIS — I129 Hypertensive chronic kidney disease with stage 1 through stage 4 chronic kidney disease, or unspecified chronic kidney disease: Secondary | ICD-10-CM | POA: Insufficient documentation

## 2018-03-05 DIAGNOSIS — Z5111 Encounter for antineoplastic chemotherapy: Secondary | ICD-10-CM | POA: Diagnosis present

## 2018-03-05 DIAGNOSIS — F17210 Nicotine dependence, cigarettes, uncomplicated: Secondary | ICD-10-CM | POA: Insufficient documentation

## 2018-03-05 LAB — CBC WITH DIFFERENTIAL/PLATELET
Basophils Absolute: 0 10*3/uL (ref 0–0.1)
Basophils Relative: 1 %
EOS ABS: 0.2 10*3/uL (ref 0–0.7)
EOS PCT: 3 %
HCT: 36.8 % — ABNORMAL LOW (ref 40.0–52.0)
Hemoglobin: 12.4 g/dL — ABNORMAL LOW (ref 13.0–18.0)
LYMPHS ABS: 1.6 10*3/uL (ref 1.0–3.6)
Lymphocytes Relative: 26 %
MCH: 33.1 pg (ref 26.0–34.0)
MCHC: 33.7 g/dL (ref 32.0–36.0)
MCV: 98.4 fL (ref 80.0–100.0)
MONO ABS: 0.6 10*3/uL (ref 0.2–1.0)
Monocytes Relative: 10 %
Neutro Abs: 3.6 10*3/uL (ref 1.4–6.5)
Neutrophils Relative %: 60 %
Platelets: 182 10*3/uL (ref 150–440)
RBC: 3.75 MIL/uL — ABNORMAL LOW (ref 4.40–5.90)
RDW: 13.3 % (ref 11.5–14.5)
WBC: 6 10*3/uL (ref 3.8–10.6)

## 2018-03-05 LAB — COMPREHENSIVE METABOLIC PANEL
ALBUMIN: 3.6 g/dL (ref 3.5–5.0)
ALT: 18 U/L (ref 0–44)
AST: 30 U/L (ref 15–41)
Alkaline Phosphatase: 96 U/L (ref 38–126)
Anion gap: 9 (ref 5–15)
BILIRUBIN TOTAL: 0.4 mg/dL (ref 0.3–1.2)
BUN: 28 mg/dL — AB (ref 8–23)
CHLORIDE: 107 mmol/L (ref 98–111)
CO2: 25 mmol/L (ref 22–32)
CREATININE: 1.46 mg/dL — AB (ref 0.61–1.24)
Calcium: 9.3 mg/dL (ref 8.9–10.3)
GFR calc Af Amer: 56 mL/min — ABNORMAL LOW (ref >60)
GFR calc non Af Amer: 49 mL/min — ABNORMAL LOW (ref >60)
GLUCOSE: 88 mg/dL (ref 70–99)
POTASSIUM: 3.9 mmol/L (ref 3.5–5.1)
Sodium: 141 mmol/L (ref 135–145)
TOTAL PROTEIN: 6.4 g/dL — AB (ref 6.5–8.1)

## 2018-03-05 LAB — PSA: Prostatic Specific Antigen: 10.62 ng/mL — ABNORMAL HIGH (ref 0.00–4.00)

## 2018-03-05 NOTE — Progress Notes (Signed)
Hematology/Oncology Consult note [facility] [phone] [phone]   Patient Care Team: [name], MD as PCP - General (Family Medicine)  REFERRING PROVIDER: [facility] CHIEF COMPLAINTS/REASON FOR VISIT:  Evaluation of elevated PSA  HISTORY OF PRESENTING ILLNESS:  Terry Mitchell is a  67 y.o.  male with PMH listed below who was referred to me for evaluation of elevated PSA. Patient was referred by primary care physician to urology due to elevated PSA at the level of 72.  Per note Patient was referred to urology for further management.  Staging imaging has been ordered including abdominal and pelvis CT with contrast and bone scan.  Patient was also given Mills Koller for treatment of clinical prostate cancer.  Patient reports that he was scheduled to return to urologist office for biopsy. Patient denies any dysuria, hematuria, fever or chills.  He lives by himself. Denies any bone pain.  Reports some soreness at the area of [place] shots, as well as hot flushes.  Otherwise no complaints.  INTERVAL HISTORY Terry Mitchell is a 67 y.o. male who has above history reviewed by me today presents for follow up visit for management of newly diagnosed prostate cancer. During the interval patient had a prostate biopsy on 02/28/2018 pathology showed prostate adenocarcinoma with androgen deprivation treatment effect.  Adenocarcinoma is present in all core fragments with involvement of 50% or more of each core.  Perineural invasion is present. As patient is on [place], Gleason grade cannot be reliably assessed after androgen deprivation therapy.  Patient reports feeling well.  Denies any bone pain, cough, chest pain, abdominal pain.  Review of Systems  Constitutional: Negative for chills, fever, malaise/fatigue and weight loss.  HENT: Negative for ear discharge, ear pain, nosebleeds and sore throat.   Eyes: Negative for double vision,  photophobia, pain, discharge and redness.  Respiratory: Negative for cough, hemoptysis, sputum production, shortness of breath and wheezing.   Cardiovascular: Negative for chest pain, palpitations, orthopnea, claudication and leg swelling.  Gastrointestinal: Negative for abdominal pain, blood in stool, heartburn, melena, nausea and vomiting.  Genitourinary: Negative for dysuria, flank pain and frequency.  Musculoskeletal: Negative for back pain, myalgias and neck pain.  Skin: Negative for itching and rash.  Neurological: Negative for dizziness, tingling, tremors, focal weakness, weakness and headaches.  Endo/Heme/Allergies: Negative for environmental allergies. Does not bruise/bleed easily.  Psychiatric/Behavioral: Negative for depression and hallucinations.    MEDICAL HISTORY:  Past Medical History:  Diagnosis Date  . Depression    recently went on disability d/t diagnosis  . History of recent fall 01/2018   missed the last step on ladder, causing back discomfort  . Hypertension   . Prostate cancer ([place]) 12/2017   prostate    SURGICAL HISTORY: Past Surgical History:  Procedure Laterality Date  . BACK SURGERY  2001    2 rods and 6 screws in back  . Left shoulder surgery Left 1998   removed a piece of bone around collar bone  . PROSTATE BIOPSY N/A 02/28/2018   Procedure: PROSTATE BIOPSY;  Surgeon: Abbie Sons, MD;  Location: [facility] ORS;  Service: Urology;  Laterality: N/A;  . TRANSRECTAL ULTRASOUND N/A 02/28/2018   Procedure: TRANSRECTAL ULTRASOUND;  Surgeon: Abbie Sons, MD;  Location: [facility] ORS;  Service: Urology;  Laterality: N/A;    SOCIAL HISTORY: Social History   Socioeconomic History  . Marital status: Single    Spouse name: Not on file  . Number of children: 4  .  Years of education: Not on file  . Highest education level: Not on file  Occupational History  . Occupation: disability  Social Needs  . Financial resource strain: Very hard  . Food insecurity:     Worry: Sometimes true    Inability: Often true  . Transportation needs:    Medical: No    Non-medical: Yes  Tobacco Use  . Smoking status: Current Every Day Smoker    Packs/day: 2.00    Types: Cigars  . Smokeless tobacco: Never Used  . Tobacco comment: 2 cigars  Substance and Sexual Activity  . Alcohol use: Yes    Alcohol/week: 2.0 standard drinks    Types: 2 Cans of beer per week    Comment: occasional  . Drug use: Yes    Types: Cocaine    Comment: rarely uses but did so in last month  . Sexual activity: Not Currently  Lifestyle  . Physical activity:    Days per week: 7 days    Minutes per session: 60 min  . Stress: Rather much  Relationships  . Social connections:    Talks on phone: Not on file    Gets together: Not on file    Attends religious service: Not on file    Active member of club or organization: Not on file    Attends meetings of clubs or organizations: Not on file    Relationship status: Not on file  . Intimate partner violence:    Fear of current or ex partner: Not on file    Emotionally abused: Not on file    Physically abused: Not on file    Forced sexual activity: Not on file  Other Topics Concern  . Not on file  Social History Narrative  . Not on file    FAMILY HISTORY: Family History  Problem Relation Age of Onset  . Stroke Mother   . Kidney failure Mother   . Diabetes Maternal Aunt     ALLERGIES:  is allergic to penicillins and lactose intolerance (gi).  MEDICATIONS:  Current Outpatient Medications  Medication Sig Dispense Refill  . losartan (COZAAR) 100 MG tablet Take 100 mg by mouth daily at 12 noon.     . vitamin B-12 (CYANOCOBALAMIN) 500 MCG tablet Take 500 mcg by mouth 3 (three) times a week.     No current facility-administered medications for this visit.      PHYSICAL EXAMINATION: ECOG PERFORMANCE STATUS: 0 - Asymptomatic Vitals:   03/19/18 1608  BP: 131/81  Pulse: (!) 55  Temp: (!) 96 F (35.6 C)   Filed Weights    03/19/18 1608  Weight: 189 lb (85.7 kg)    Physical Exam  Constitutional: He is oriented to person, place, and time. He appears well-developed and well-nourished. No distress.  HENT:  Head: Normocephalic and atraumatic.  Mouth/Throat: Oropharynx is clear and moist.  Eyes: Pupils are equal, round, and reactive to light. Conjunctivae and EOM are normal. No scleral icterus.  Neck: Normal range of motion. Neck supple.  Cardiovascular: Normal rate, regular rhythm and normal heart sounds.  Pulmonary/Chest: Effort normal. No respiratory distress. He has no wheezes.  Decreased breath sound bilaterally  Abdominal: Soft. Bowel sounds are normal. He exhibits no distension and no mass. There is no tenderness.  Musculoskeletal: Normal range of motion. He exhibits no edema or deformity.  Lymphadenopathy:    He has no cervical adenopathy.  Neurological: He is alert and oriented to person, place, and time. No cranial nerve deficit. Coordination  normal.  Skin: Skin is warm and dry. No rash noted. No erythema.  Psychiatric: He has a normal mood and affect. His behavior is normal. Thought content normal.     LABORATORY DATA:  I have reviewed the data as listed Lab Results  Component Value Date   WBC 6.0 03/05/2018   HGB 12.4 (L) 03/05/2018   HCT 36.8 (L) 03/05/2018   MCV 98.4 03/05/2018   PLT 182 03/05/2018   Recent Labs    02/20/18 1708 02/21/18 0152 03/05/18 0858  NA 144 141 141  K 3.5 4.6 3.9  CL 112* 108 107  CO2 22 28 25   GLUCOSE 111* 130* 88  BUN 28* 31* 28*  CREATININE 1.67* 1.56* 1.46*  CALCIUM 8.6* 8.7* 9.3  GFRNONAA 41* 45* 49*  GFRAA 48* 52* 56*  PROT 5.4* 5.2* 6.4*  ALBUMIN 3.2* 3.1* 3.6  AST 51* 76* 30  ALT 30 33 18  ALKPHOS 54 48 96  BILITOT 0.5 0.5 0.4   Iron/TIBC/Ferritin/ %Sat No results found for: IRON, TIBC, FERRITIN, IRONPCTSAT      ASSESSMENT & PLAN:  1. Androgen deprivation therapy   2. Prostate cancer [facility])    Pathology reports were discussed with  patient.  We discussed about prostate cancer diagnosis and prognosis.  Recommend image study for further determine extent of disease.  It appears that she is supposed to have CT abdomen pelvis and bone scan done however patient has not obtained these test yet.  Patient reports that" nobody called me to arrange that"  I obtained baseline CBC and CMP.  Labs reviewed.  Patient has chronic kidney disease, will obtain a PET scan other than CT contrast study. Continue androgen deprivation treatment. Baseline PSA is elevated at 72.  Today's PSA is pending. Check testosterone level.  Orders Placed This Encounter  Procedures  . NM PET Image Initial (PI) Skull Base To Thigh    Standing Status:   Future    Standing Expiration Date:   03/06/2019    Order Specific Question:   If indicated for the ordered procedure, I authorize the administration of a radiopharmaceutical per Radiology protocol    Answer:   Yes    Order Specific Question:   Preferred imaging location?    Answer:   [facility]    Order Specific Question:   Radiology Contrast Protocol - do NOT remove file path    Answer:   [email or web address]    All questions were answered. The patient knows to call the clinic with any problems questions or concerns. Total face to face encounter time for this patient visit was 45 min. >50% of the time was  spent in counseling and coordination of care.  Return of visit: To be determined based on patient's image scan.  [name], MD, PhD Hematology Oncology [facility] Pager- [phone] 03/05/2018

## 2018-03-11 ENCOUNTER — Encounter
Admission: RE | Admit: 2018-03-11 | Discharge: 2018-03-11 | Disposition: A | Discharge status: Home / Self Care | Payer: Medicare Other | Source: Ambulatory Visit | Attending: Oncology | Admitting: Oncology

## 2018-03-11 DIAGNOSIS — C61 Malignant neoplasm of prostate: Secondary | ICD-10-CM | POA: Diagnosis present

## 2018-03-11 LAB — GLUCOSE, CAPILLARY: GLUCOSE-CAPILLARY: 96 mg/dL (ref 70–99)

## 2018-03-11 IMAGING — CT NM PET TUM IMG INITIAL (PI) SKULL BASE T - THIGH
9 series · 24 of 25 positions shown · non-contrast
Comparison: CT neck from [DATE]

CLINICAL DATA: Initial treatment strategy for high-risk prostate
cancer.

EXAM:
NUCLEAR MEDICINE PET SKULL BASE TO THIGH
TECHNIQUE: 10.4 mCi F-18 FDG was injected intravenously. Full-ring PET imaging
was performed from the skull base to thigh after the radiotracer. CT
data was obtained and used for attenuation correction and anatomic
localization.
Fasting blood glucose: 96 mg/dl

[Series 3: ct wb 5.0 b30f · axial · 5.0mm · 0.98mm/px · z∈[-1158,-174]mm · 3 of 329 slices shown]
[im 1/329]
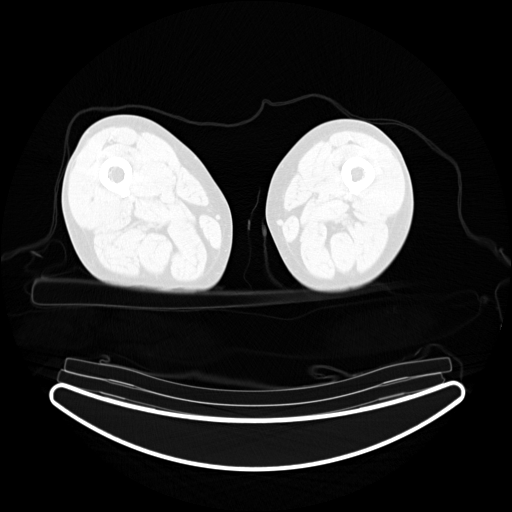
[im 165/329]
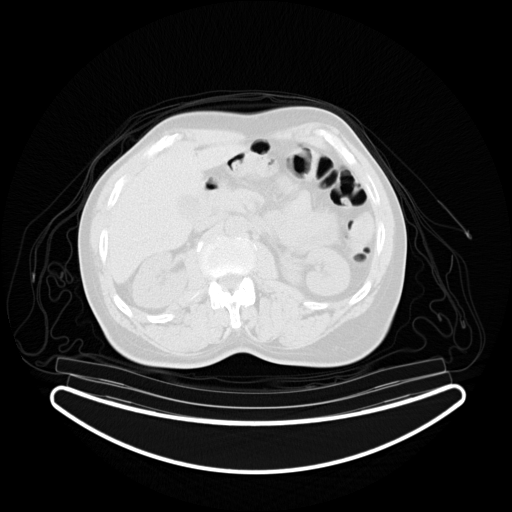
[im 329/329  brain]
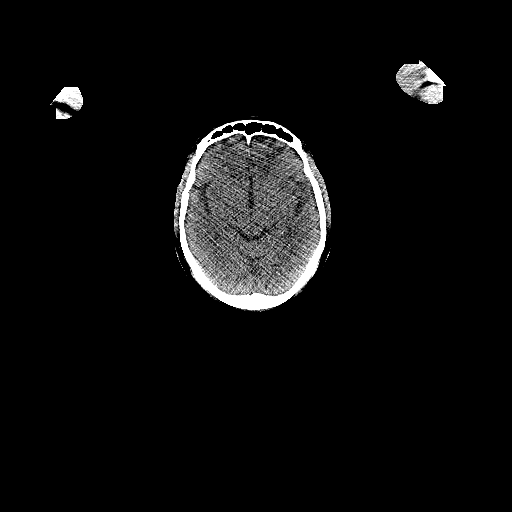

[Series 4: pet wb (ac) · axial · 5.0mm · 2.72mm/px · z∈[-1158,-174]mm · 4 of 329 slices shown]
[im 1/329]
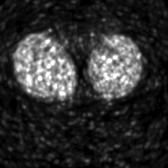
[im 110/329]
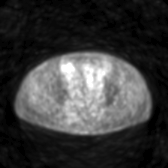
[im 219/329]
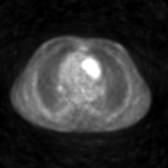
[im 329/329]
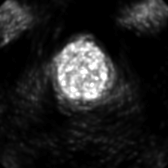

[Series 5: pet wb uncorrected (nac) · axial · 5.0mm · 4.07mm/px · z∈[-1158,-174]mm · 4 of 329 slices shown]
[im 1/329]
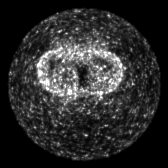
[im 110/329]
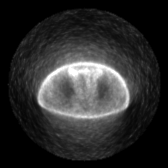
[im 219/329]
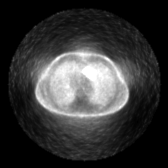
[im 329/329]
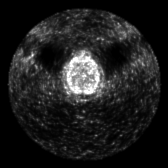

[Series 603: fused axial · 3 of 327 slices shown]
[im 1/327]
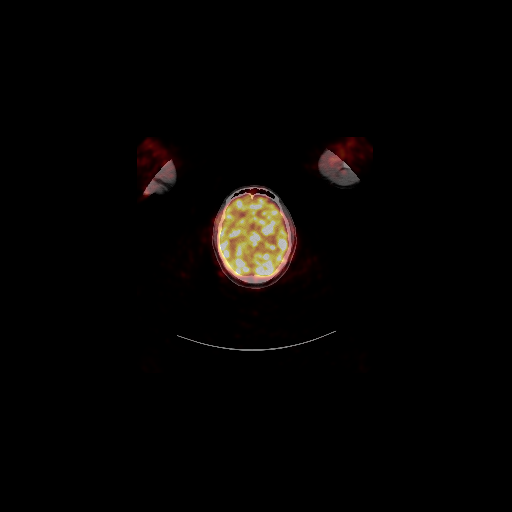
[im 218/327]
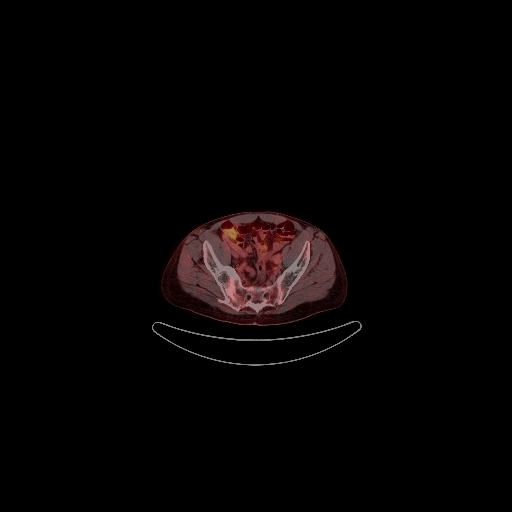
[im 327/327]
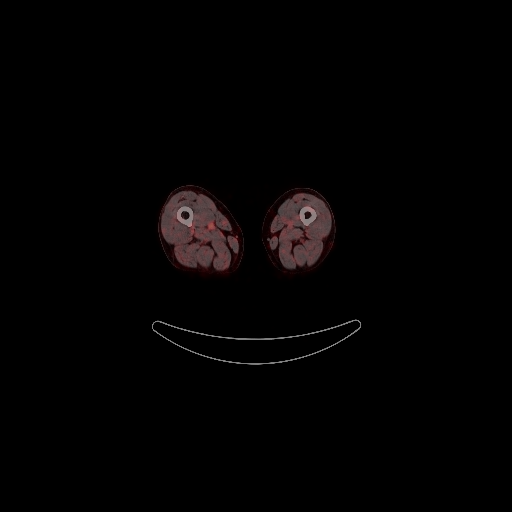

[Series 604: fused coronal · 1 of 49 slices shown]
[im 1/49]
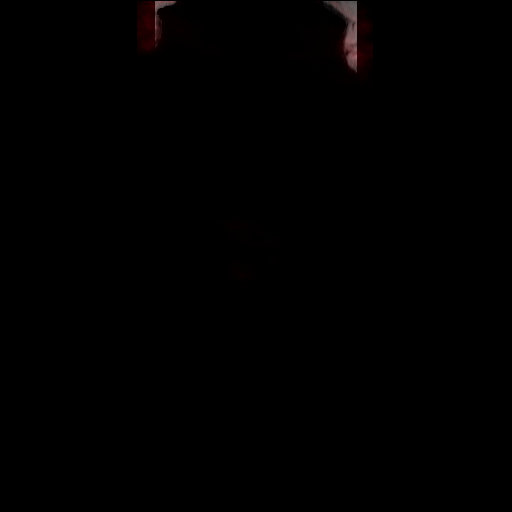

[Series 605: fused sagittal · 2 of 129 slices shown]
[im 1/129]
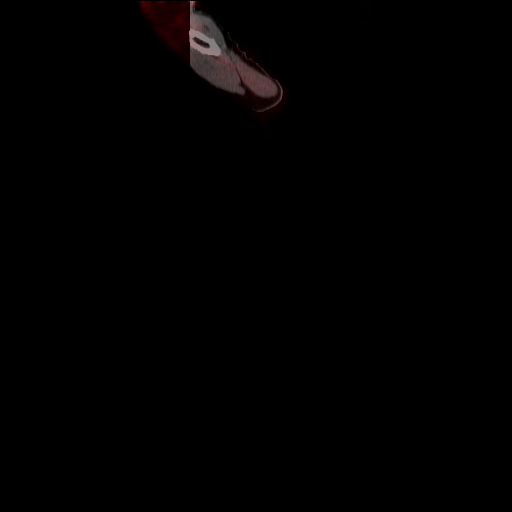
[im 129/129]
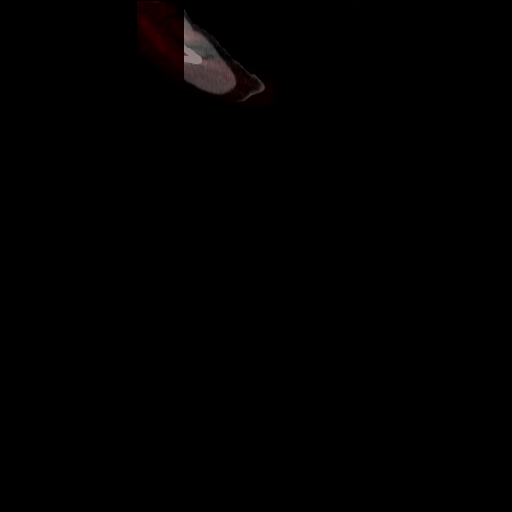

[Series 606: pet axial · 4 of 326 slices shown]
[im 1/326]
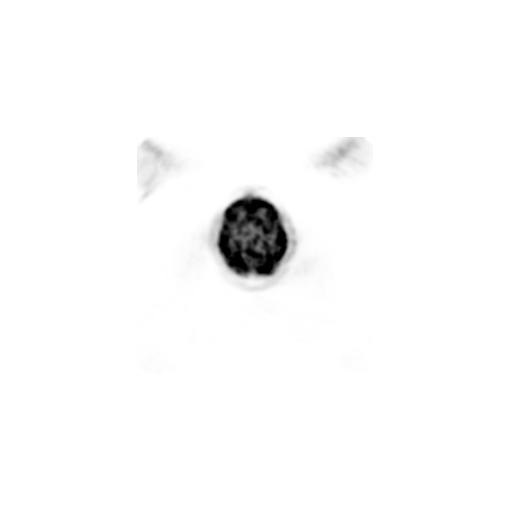
[im 109/326]
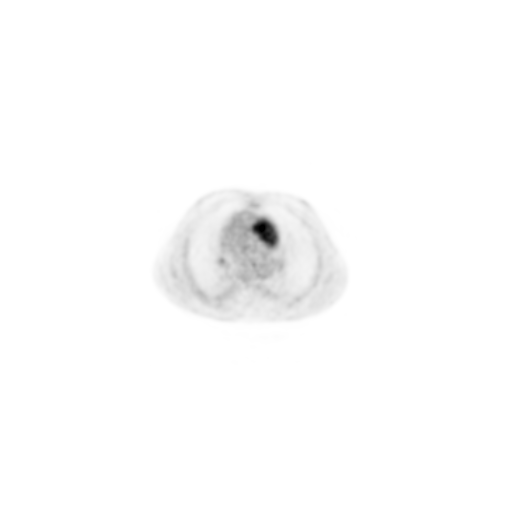
[im 217/326]
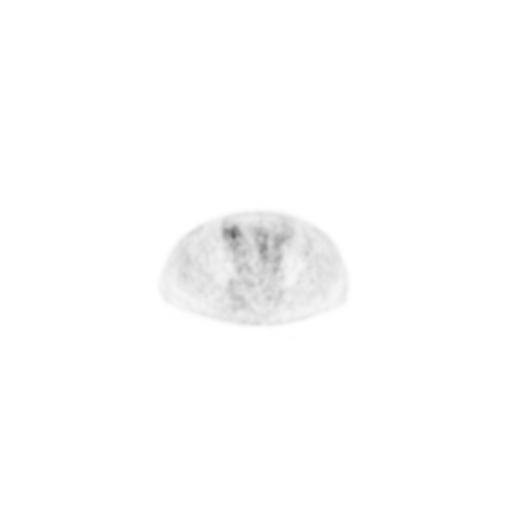
[im 326/326]
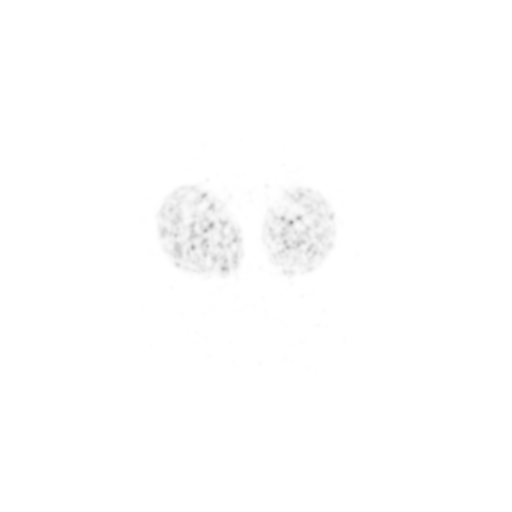

[Series 607: pet coronal · 1 of 103 slices shown]
[im 1/103]
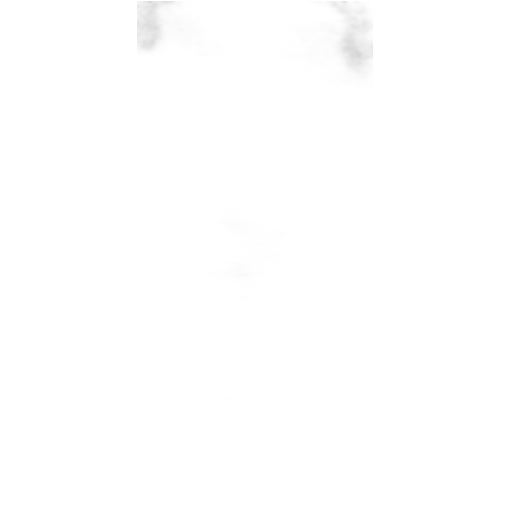

[Series 608: pet sagittal · 2 of 145 slices shown]
[im 1/145]
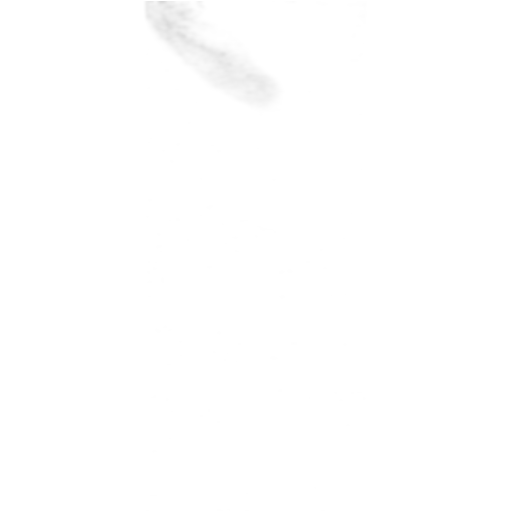
[im 145/145]
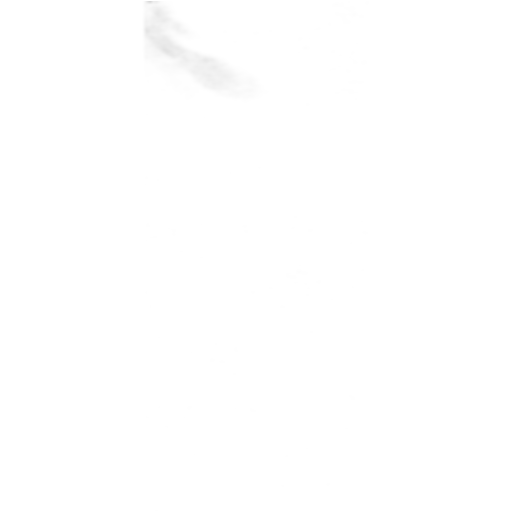

[24 of 25 positions shown; findings below may reference images not displayed]

FINDINGS: Mediastinal blood pool activity: SUV max

NECK: No significant abnormal hypermetabolic activity in this
region.

Incidental CT findings: Chronic ethmoid, maxillary, and right
sphenoid sinusitis.

CHEST: Subtle activity in the left posterior supraclavicular region
is likely from hypermetabolic brown fat. There is no corresponding
CT abnormality.

Incidental CT findings: Left anterior descending coronary artery
atherosclerosis. Mild atherosclerotic calcification of the aortic
arch. Mild gynecomastia. Centrilobular emphysema.

ABDOMEN/PELVIS: Accentuated activity in the lower cecum without
definite CT abnormality, maximum SUV 5.3.

Right external iliac node 0.7 cm in short axis on image 221/3,
maximum SUV 2.0. Left inguinal lymph node 1.2 cm in short axis on
image 244/3, maximum SUV 1.3. Left external iliac node 1.4 cm in
short axis on image 224/3, maximum SUV 2.5.

Incidental CT findings: Aortoiliac atherosclerotic vascular disease.
Photopenic exophytic lesion of the right kidney upper pole
compatible with cyst.

The aortic bifurcation and adjacent proximal common iliac
vasculature and potential nodes are obscured by streak artifact from
the lumbar hardware.

SKELETON: Note is again made of a sclerotic lesion in the left T2
pedicle on image 61/3, without appreciable associated hypermetabolic
activity. Low-grade activity along healing right anterior rib
fractures such as the mildly angulated right anterior ninth rib
fracture shown on image 166/3 which has a maximum SUV of 3.2. Benign
etiology for these rib fractures is favored.

There is some nonspecific sclerosis anteriorly in the right sixth
rib for example on image 128/3, significance uncertain, without
associated accentuated metabolic activity.

Incidental CT findings: Failure fusion of the posterior arch of C1.
Acquired interbody fusion at C5-C6-C7 with multilevel facet
arthropathy.

Right iliac bone graft harvest site noted. Posterolateral rod and
pedicle screw fixation in the lumbar spine and S1 level of the
sacrum. Bridging spurring of the sacroiliac joints. Synovial
herniation pit of the right anterior femur.
IMPRESSION: 1. There is an enlarged left external iliac node measuring 1.4 cm in
short axis with maximum SUV 2.5. Although the activity level is
relatively low, given the size of this lymph node, suspicion is
raised for the possibility of metastatic disease.
2. A 7 mm right external iliac lymph node and a 1.2 cm left inguinal
lymph node demonstrate only low-grade metabolic activity and are not
overtly enlarged, and thus not specific for malignancy.
3. Sclerotic lesion in the left T2 pedicle. Faint sclerosis in the
right anterior sixth rib. Neither has significant metabolic
activity. Significance uncertain, both processes could be benign but
merit surveillance.
4. Benign-appearing rib fractures with associated low-grade activity
along several right anterior ribs including the right ninth rib.
5. Activity in the cecum without associated CT abnormality, most
likely to be benign. Correlation with the patient's colon cancer
screening history is recommended. If screening is not up-to-date,
appropriate screening should be considered.
6. Other imaging findings of potential clinical significance:
Chronic paranasal sinusitis. Aortic Atherosclerosis ([W5]-[W5])
and Emphysema ([W5]-[W5]). Coronary atherosclerosis.

## 2018-03-11 MED ORDER — FLUDEOXYGLUCOSE F - 18 (FDG) INJECTION
9.6000 | Freq: Once | INTRAVENOUS | Status: AC | PRN
  Administered 2018-03-11: 10.35 via INTRAVENOUS

## 2018-03-13 ENCOUNTER — Other Ambulatory Visit: Payer: Self-pay | Admitting: Oncology

## 2018-03-13 DIAGNOSIS — C61 Malignant neoplasm of prostate: Secondary | ICD-10-CM

## 2018-03-14 ENCOUNTER — Encounter: Payer: Self-pay | Admitting: Urology

## 2018-03-14 ENCOUNTER — Ambulatory Visit: Payer: Medicare Other | Admitting: Urology

## 2018-03-19 ENCOUNTER — Encounter: Payer: Self-pay | Admitting: Oncology

## 2018-03-19 ENCOUNTER — Inpatient Hospital Stay (HOSPITAL_BASED_OUTPATIENT_CLINIC_OR_DEPARTMENT_OTHER): Payer: Medicare Other | Admitting: Oncology

## 2018-03-19 ENCOUNTER — Other Ambulatory Visit: Payer: Self-pay

## 2018-03-19 VITALS — BP 107/66 | HR 100 | Temp 97.0°F | Resp 18 | Wt 187.3 lb

## 2018-03-19 DIAGNOSIS — F17290 Nicotine dependence, other tobacco product, uncomplicated: Secondary | ICD-10-CM

## 2018-03-19 DIAGNOSIS — R232 Flushing: Secondary | ICD-10-CM | POA: Diagnosis not present

## 2018-03-19 DIAGNOSIS — N189 Chronic kidney disease, unspecified: Secondary | ICD-10-CM

## 2018-03-19 DIAGNOSIS — Z7189 Other specified counseling: Secondary | ICD-10-CM

## 2018-03-19 DIAGNOSIS — R9721 Rising PSA following treatment for malignant neoplasm of prostate: Secondary | ICD-10-CM | POA: Diagnosis not present

## 2018-03-19 DIAGNOSIS — F17210 Nicotine dependence, cigarettes, uncomplicated: Secondary | ICD-10-CM

## 2018-03-19 DIAGNOSIS — Z5111 Encounter for antineoplastic chemotherapy: Secondary | ICD-10-CM | POA: Diagnosis not present

## 2018-03-19 DIAGNOSIS — C774 Secondary and unspecified malignant neoplasm of inguinal and lower limb lymph nodes: Secondary | ICD-10-CM

## 2018-03-19 DIAGNOSIS — Z79899 Other long term (current) drug therapy: Secondary | ICD-10-CM

## 2018-03-19 DIAGNOSIS — I129 Hypertensive chronic kidney disease with stage 1 through stage 4 chronic kidney disease, or unspecified chronic kidney disease: Secondary | ICD-10-CM

## 2018-03-19 DIAGNOSIS — Z79818 Long term (current) use of other agents affecting estrogen receptors and estrogen levels: Secondary | ICD-10-CM

## 2018-03-19 DIAGNOSIS — R972 Elevated prostate specific antigen [PSA]: Secondary | ICD-10-CM | POA: Insufficient documentation

## 2018-03-19 DIAGNOSIS — C61 Malignant neoplasm of prostate: Secondary | ICD-10-CM | POA: Diagnosis not present

## 2018-03-19 MED ORDER — VENLAFAXINE HCL 37.5 MG PO TABS: 37.5000 mg | ORAL_TABLET | Freq: Every day | ORAL | 0 refills | Status: DC

## 2018-03-19 NOTE — Progress Notes (Signed)
Hematology/Oncology Consult note [facility] Telephone:([phone] [phone]   Patient Care Team: [name], MD as PCP - General (Family Medicine)  REFERRING PROVIDER: [facility] CHIEF COMPLAINTS/REASON FOR VISIT:  Evaluation of elevated PSA  HISTORY OF PRESENTING ILLNESS:  Terry Mitchell is a  67 y.o.  male with PMH listed below who was referred to me for evaluation of elevated PSA. Patient was referred by primary care physician to urology due to elevated PSA at the level of 72.  Per note Patient was referred to urology for further management.  Staging imaging has been ordered including abdominal and pelvis CT with contrast and bone scan.  Patient was also given Mills Koller for treatment of clinical prostate cancer.  Patient reports that he was scheduled to return to urologist office for biopsy. Patient denies any dysuria, hematuria, fever or chills.  He lives by himself. Denies any bone pain.  Reports some soreness at the area of [place] shots, as well as hot flushes.  Otherwise no complaints.  #01/01/2018 Patient received Firmagon loading dose at urologist office  # patient had a prostate biopsy on 02/28/2018 pathology showed prostate adenocarcinoma with androgen deprivation treatment effect.  Adenocarcinoma is present in all core fragments with involvement of 50% or more of each core.  Perineural invasion is present. As patient is on Firmagon, Gleason grade cannot be reliably assessed after androgen deprivation therapy.  INTERVAL HISTORY Terry Mitchell is a 67 y.o. male who has above history reviewed by me today presents for follow up visit for discussion of PET scan and management plan for prostate cancer.  Patient reports severe hot flush, intermittent, almost every 45 minutes he will have an episode of hot flash.  And afterwards he will have chills.  Reports that this is also interfering with his sleep.  Denies any bone pain,  abdominal pain or difficulty of urination.  Review of Systems  Constitutional: Negative for chills, fever, malaise/fatigue and weight loss.       Hot flush  HENT: Negative for ear discharge, ear pain, nosebleeds and sore throat.   Eyes: Negative for double vision, photophobia, pain, discharge and redness.  Respiratory: Negative for cough, hemoptysis, sputum production, shortness of breath and wheezing.   Cardiovascular: Negative for chest pain, palpitations, orthopnea, claudication and leg swelling.  Gastrointestinal: Negative for abdominal pain, blood in stool, heartburn, melena, nausea and vomiting.  Genitourinary: Negative for dysuria, flank pain and frequency.  Musculoskeletal: Negative for back pain, myalgias and neck pain.  Skin: Negative for itching and rash.  Neurological: Negative for dizziness, tingling, tremors, focal weakness, weakness and headaches.  Endo/Heme/Allergies: Negative for environmental allergies. Does not bruise/bleed easily.  Psychiatric/Behavioral: Negative for depression and hallucinations. The patient has insomnia.     MEDICAL HISTORY:  Past Medical History:  Diagnosis Date  . Depression    recently went on disability d/t diagnosis  . History of recent fall 01/2018   missed the last step on ladder, causing back discomfort  . Hypertension   . Prostate cancer ([place]) 12/2017   prostate    SURGICAL HISTORY: Past Surgical History:  Procedure Laterality Date  . BACK SURGERY  2001    2 rods and 6 screws in back  . Left shoulder surgery Left 1998   removed a piece of bone around collar bone  . PROSTATE BIOPSY N/A 02/28/2018   Procedure: PROSTATE BIOPSY;  Surgeon: Abbie Sons, MD;  Location: [facility] ORS;  Service: Urology;  Laterality: N/A;  .  TRANSRECTAL ULTRASOUND N/A 02/28/2018   Procedure: TRANSRECTAL ULTRASOUND;  Surgeon: Abbie Sons, MD;  Location: [facility] ORS;  Service: Urology;  Laterality: N/A;    SOCIAL HISTORY: Social History   Socioeconomic  History  . Marital status: Single    Spouse name: Not on file  . Number of children: 4  . Years of education: Not on file  . Highest education level: Not on file  Occupational History  . Occupation: disability  Social Needs  . Financial resource strain: Very hard  . Food insecurity:    Worry: Sometimes true    Inability: Often true  . Transportation needs:    Medical: No    Non-medical: Yes  Tobacco Use  . Smoking status: Current Every Day Smoker    Packs/day: 2.00    Types: Cigars  . Smokeless tobacco: Never Used  . Tobacco comment: 2 cigars  Substance and Sexual Activity  . Alcohol use: Yes    Alcohol/week: 2.0 standard drinks    Types: 2 Cans of beer per week    Comment: occasional  . Drug use: Yes    Types: Cocaine    Comment: rarely uses but did so in last month  . Sexual activity: Not Currently  Lifestyle  . Physical activity:    Days per week: 7 days    Minutes per session: 60 min  . Stress: Rather much  Relationships  . Social connections:    Talks on phone: Not on file    Gets together: Not on file    Attends religious service: Not on file    Active member of club or organization: Not on file    Attends meetings of clubs or organizations: Not on file    Relationship status: Not on file  . Intimate partner violence:    Fear of current or ex partner: Not on file    Emotionally abused: Not on file    Physically abused: Not on file    Forced sexual activity: Not on file  Other Topics Concern  . Not on file  Social History Narrative  . Not on file    FAMILY HISTORY: Family History  Problem Relation Age of Onset  . Stroke Mother   . Kidney failure Mother   . Diabetes Maternal Aunt     ALLERGIES:  is allergic to penicillins and lactose intolerance (gi).  MEDICATIONS:  Current Outpatient Medications  Medication Sig Dispense Refill  . losartan (COZAAR) 50 MG tablet Take 50 mg by mouth daily. for high blood pressure  2  . vitamin B-12  (CYANOCOBALAMIN) 500 MCG tablet Take 500 mcg by mouth 3 (three) times a week.     No current facility-administered medications for this visit.      PHYSICAL EXAMINATION: ECOG PERFORMANCE STATUS: 0 - Asymptomatic Vitals:   03/19/18 1342  BP: 107/66  Pulse: 100  Resp: 18  Temp: (!) 97 F (36.1 C)  SpO2: 97%   Filed Weights   03/19/18 1342  Weight: 187 lb 4.8 oz (85 kg)    Physical Exam  Constitutional: He is oriented to person, place, and time. He appears well-developed and well-nourished. No distress.  HENT:  Head: Normocephalic and atraumatic.  Right Ear: External ear normal.  Left Ear: External ear normal.  Mouth/Throat: Oropharynx is clear and moist.  Eyes: Pupils are equal, round, and reactive to light. Conjunctivae and EOM are normal. No scleral icterus.  Neck: Normal range of motion. Neck supple.  Cardiovascular: Normal rate, regular rhythm and  normal heart sounds.  Pulmonary/Chest: Effort normal. No respiratory distress. He has no wheezes.  Decreased breath sound bilaterally  Abdominal: Soft. Bowel sounds are normal. He exhibits no distension and no mass. There is no tenderness.  Musculoskeletal: Normal range of motion. He exhibits no edema or deformity.  Lymphadenopathy:    He has no cervical adenopathy.  Neurological: He is alert and oriented to person, place, and time. No cranial nerve deficit. Coordination normal.  Skin: Skin is warm. No rash noted. No erythema.  Psychiatric: He has a normal mood and affect. His behavior is normal. Thought content normal.     LABORATORY DATA:  I have reviewed the data as listed Lab Results  Component Value Date   WBC 6.0 03/05/2018   HGB 12.4 (L) 03/05/2018   HCT 36.8 (L) 03/05/2018   MCV 98.4 03/05/2018   PLT 182 03/05/2018   Recent Labs    02/20/18 1708 02/21/18 0152 03/05/18 0858  NA 144 141 141  K 3.5 4.6 3.9  CL 112* 108 107  CO2 22 28 25   GLUCOSE 111* 130* 88  BUN 28* 31* 28*  CREATININE 1.67* 1.56*  1.46*  CALCIUM 8.6* 8.7* 9.3  GFRNONAA 41* 45* 49*  GFRAA 48* 52* 56*  PROT 5.4* 5.2* 6.4*  ALBUMIN 3.2* 3.1* 3.6  AST 51* 76* 30  ALT 30 33 18  ALKPHOS 54 48 96  BILITOT 0.5 0.5 0.4   Iron/TIBC/Ferritin/ %Sat No results found for: IRON, TIBC, FERRITIN, IRONPCTSAT      ASSESSMENT & PLAN:  1. Prostate cancer ([place])   2. Androgen deprivation therapy   3. PSA elevation   4. Goals of care, counseling/discussion   Cancer Staging Prostate cancer [facility]) Staging form: Prostate, AJCC 8th Edition - Clinical stage from 03/19/2018: Stage IVA (cTX, cN1, cM0) - Signed by [name], MD on 03/19/2018  Prostate cancer diagnosis was discussed with patient and last visit.  PET scan was done during the interval. Image was independently reviewed and discussed with patient and his wife. #.  Patient had an enlarged left external iliac node measuring 1.4 cm in short axis with maximum SUV 2.5.  Although activity level is relatively low, given the size of the lymph node suspicion is raised for the possibility of metastatic disease. There is also a 7 mm right external iliac lymph node and a 1.2 cm left inguinal lymph node demonstrate only low metabolic activity.  Not overtly enlarged.  [name], faint sclerosis in the right anterior sixth rib.  Neither has significant metabolic activity.  Significance uncertain. Benign-appearing rib fractures with associated low-grade activity along several right anterior ribs include the right ninth rib. Activity in the cecum without associated CT abnormality.  Most likely benign.   Discussed with patient that PET scan showed most likely lymph node involvement of prostate cancer.  It is unclear whether his bone lesions are related or nonrelated to prostate cancer.  Really low nonspecific metabolic activity however patient did received ADT treatment prior to biopsy and staging images.   I discussed with patient's  urologist Dr. Bernardo Heater who did patient's prostate biopsy.  He agrees that given Mills Koller prior to biopsy and staging images are not typical.  He agrees that PET scan is consistent with lymph node involvement.  He does not think patient is a good candidate for radical prostatectomy.  He agrees with me about the plan that definitive radiation is a good option.  Will refer  to radiation oncology Dr. Baruch Gouty for evaluation of definitive treatments  I discussed with patient that we will have to keep an eye on those bone lesions.  Patient will get maintenance Firmagon injection this week.  Can be switched to Lupron every 3 months in the future. Patient has castration sensitive prostate cancer and responding to ADT treatment.  PSA has decreased to 10.62.  #Hot flush, secondary to [place].  Explained and discussed with patient.  Recommend trying Effexor 37.5 mg daily to see if that is helpful.  Dose can be further titrated in the future. #Goal of care was discussed with patient.  Stage IV disease.  Palliative treatment intent.  Orders Placed This Encounter  Procedures  . CBC with Differential/Platelet    Standing Status:   Future    Standing Expiration Date:   03/20/2019  . PSA    Standing Status:   Future    Standing Expiration Date:   03/20/2019  . Comprehensive metabolic panel    Standing Status:   Future    Standing Expiration Date:   03/20/2019  . Testosterone    Standing Status:   Future    Standing Expiration Date:   03/20/2019    All questions were answered. The patient knows to call the clinic with any problems questions or concerns.  Return in 1 months, will switch to Lupron every 3 months at that point.  [name], MD, PhD Hematology Oncology [facility] at [facility] Pager- [phone] 03/19/2018

## 2018-03-19 NOTE — Progress Notes (Signed)
Patient here for follow. States he has been sweating excessively, he wakes up at night every 45 minutes.

## 2018-03-21 ENCOUNTER — Inpatient Hospital Stay: Payer: Medicare Other

## 2018-03-21 DIAGNOSIS — Z79818 Long term (current) use of other agents affecting estrogen receptors and estrogen levels: Secondary | ICD-10-CM

## 2018-03-21 DIAGNOSIS — R972 Elevated prostate specific antigen [PSA]: Secondary | ICD-10-CM

## 2018-03-21 DIAGNOSIS — Z5111 Encounter for antineoplastic chemotherapy: Secondary | ICD-10-CM | POA: Diagnosis not present

## 2018-03-21 DIAGNOSIS — C61 Malignant neoplasm of prostate: Secondary | ICD-10-CM

## 2018-03-21 MED ORDER — DEGARELIX ACETATE 80 MG ~~LOC~~ SOLR
80.0000 mg | Freq: Once | SUBCUTANEOUS | Status: AC
  Administered 2018-03-21: 80 mg via SUBCUTANEOUS
  Filled 2018-03-21: qty 4

## 2018-03-25 ENCOUNTER — Other Ambulatory Visit: Payer: Self-pay

## 2018-03-25 ENCOUNTER — Encounter: Payer: Self-pay | Admitting: Radiation Oncology

## 2018-03-25 ENCOUNTER — Ambulatory Visit
Admission: RE | Admit: 2018-03-25 | Discharge: 2018-03-25 | Disposition: A | Discharge status: Home / Self Care | Payer: Medicare Other | Source: Ambulatory Visit | Attending: Radiation Oncology | Admitting: Radiation Oncology

## 2018-03-25 VITALS — BP 181/99 | HR 63 | Temp 97.6°F

## 2018-03-25 DIAGNOSIS — I10 Essential (primary) hypertension: Secondary | ICD-10-CM | POA: Diagnosis not present

## 2018-03-25 DIAGNOSIS — F329 Major depressive disorder, single episode, unspecified: Secondary | ICD-10-CM | POA: Insufficient documentation

## 2018-03-25 DIAGNOSIS — C61 Malignant neoplasm of prostate: Secondary | ICD-10-CM | POA: Insufficient documentation

## 2018-03-25 DIAGNOSIS — Z79899 Other long term (current) drug therapy: Secondary | ICD-10-CM | POA: Diagnosis not present

## 2018-03-25 DIAGNOSIS — R599 Enlarged lymph nodes, unspecified: Secondary | ICD-10-CM | POA: Insufficient documentation

## 2018-03-25 DIAGNOSIS — R232 Flushing: Secondary | ICD-10-CM | POA: Insufficient documentation

## 2018-03-25 DIAGNOSIS — F17290 Nicotine dependence, other tobacco product, uncomplicated: Secondary | ICD-10-CM | POA: Diagnosis not present

## 2018-03-25 NOTE — Consult Note (Signed)
NEW PATIENT EVALUATION  Name: Terry Mitchell  MRN: [id number]  Date:   03/25/2018     DOB: 1951-01-07   This 67 y.o. male patient presents to the clinic for initial evaluation ofstage IIb at least adenocarcinoma the prostate.  REFERRING PHYSICIAN: [name], MD  CHIEF COMPLAINT:  Chief Complaint  Patient presents with  . Prostate Cancer    DIAGNOSIS: The encounter diagnosis was Prostate cancer ([place]).   PREVIOUS INVESTIGATIONS:  PET CT scan reviewed Pathology report reviewed Clinical notes reviewed  HPI: patient is a 67 year old male who was seen for evaluation for left lower extremity pain. That time blood work revealed a PSA of 72. He was started on [place] gone for treatment of presumed prostate cancer.6 core biopsies were performed of his prostate all positive for adenocarcinoma Gleason score cannot be determined based on prior androgen deprivation therapy effect.he went on to have a PET CT scan showing enlarged left external iliac node measuring 1.4 cm with SUV of 2.5 as well as a 7 mm right external iliac node and a 1.2 cm left inguinal lymph node not specific for malignancy. He also had sclerotic lesions in the left T to pedicle but without metabolic activity.patient does have frequent bowel movements and also complains of intermittent urinary frequency urgency and nocturia 2. He is also having some hot flashes I have suggested vitamin E supplements for that. Pathology also demonstrated on biopsy perineural invasion as well as a least 50% involvement of all cores sampled.he was seen by medical oncology and even with lymph node involvement he is referred to radiation oncology for consideration of treatment. He is having no bone pain at this time or back pain. Urology has passed on prostatectomy.  PLANNED TREATMENT REGIMEN: external beam radiation therapy to prostate and pelvic nodes as well as I-125 interstitial implant for boost  PAST MEDICAL HISTORY:  has a past medical  history of Depression, History of recent fall (01/2018), Hypertension, and Prostate cancer ([place]) (12/2017).    PAST SURGICAL HISTORY:  Past Surgical History:  Procedure Laterality Date  . BACK SURGERY  2001    2 rods and 6 screws in back  . Left shoulder surgery Left 1998   removed a piece of bone around collar bone  . PROSTATE BIOPSY N/A 02/28/2018   Procedure: PROSTATE BIOPSY;  Surgeon: Abbie Sons, MD;  Location: [facility] ORS;  Service: Urology;  Laterality: N/A;  . TRANSRECTAL ULTRASOUND N/A 02/28/2018   Procedure: TRANSRECTAL ULTRASOUND;  Surgeon: Abbie Sons, MD;  Location: [facility] ORS;  Service: Urology;  Laterality: N/A;    FAMILY HISTORY: family history includes Diabetes in his maternal aunt; Kidney failure in his mother; Stroke in his mother.  SOCIAL HISTORY:  reports that he has been smoking cigars. He has been smoking about 2.00 packs per day. He has never used smokeless tobacco. He reports that he drinks about 2.0 standard drinks of alcohol per week. He reports that he has current or past drug history. Drug: Cocaine.  ALLERGIES: Penicillins and Lactose intolerance (gi)  MEDICATIONS:  Current Outpatient Medications  Medication Sig Dispense Refill  . losartan (COZAAR) 50 MG tablet Take 50 mg by mouth daily. for high blood pressure  2  . venlafaxine (EFFEXOR) 37.5 MG tablet Take 1 tablet (37.5 mg total) by mouth daily. 30 tablet 0  . vitamin B-12 (CYANOCOBALAMIN) 500 MCG tablet Take 500 mcg by mouth 3 (three) times a week.     No current facility-administered medications for this encounter.  ECOG PERFORMANCE STATUS:  0 - Asymptomatic  REVIEW OF SYSTEMS:  Patient denies any weight loss, fatigue, weakness, fever, chills or night sweats. Patient denies any loss of vision, blurred vision. Patient denies any ringing  of the ears or hearing loss. No irregular heartbeat. Patient denies heart murmur or history of fainting. Patient denies any chest pain or pain radiating to her  upper extremities. Patient denies any shortness of breath, difficulty breathing at night, cough or hemoptysis. Patient denies any swelling in the lower legs. Patient denies any nausea vomiting, vomiting of blood, or coffee ground material in the vomitus. Patient denies any stomach pain. Patient states has had normal bowel movements no significant constipation or diarrhea. Patient denies any dysuria, hematuria or significant nocturia. Patient denies any problems walking, swelling in the joints or loss of balance. Patient denies any skin changes, loss of hair or loss of weight. Patient denies any excessive worrying or anxiety or significant depression. Patient denies any problems with insomnia. Patient denies excessive thirst, polyuria, polydipsia. Patient denies any swollen glands, patient denies easy bruising or easy bleeding. Patient denies any recent infections, allergies or URI. Patient "s visual fields have not changed significantly in recent time.    PHYSICAL EXAM: BP (!) 181/99 (BP Location: Right Arm, Patient Position: Sitting)   Pulse 63   Temp 97.6 F (36.4 C) (Tympanic)   Resp (P) 16   Ht (P) 6' (1.829 m)   Wt (P) 193 lb 0.2 oz (87.5 kg)   BMI (P) 26.18 kg/m  On rectal exam rectal sphincter tone is good prostate is asymmetric with the left lateral lobe firm and nodular. Sulcus is preserved bilaterally. No other rectal abnormality is identified.Well-developed well-nourished patient in NAD. HEENT reveals PERLA, EOMI, discs not visualized.  Oral cavity is clear. No oral mucosal lesions are identified. Neck is clear without evidence of cervical or supraclavicular adenopathy. Lungs are clear to A&P. Cardiac examination is essentially unremarkable with regular rate and rhythm without murmur rub or thrill. Abdomen is benign with no organomegaly or masses noted. Motor sensory and DTR levels are equal and symmetric in the upper and lower extremities. Cranial nerves II through XII are grossly intact.  Proprioception is intact. No peripheral adenopathy or edema is identified. No motor or sensory levels are noted. Crude visual fields are within normal range.  LABORATORY DATA: pathology reports reviewed    RADIOLOGY RESULTS:PET CT scan reviewed   IMPRESSION: locally advanced adenocarcinoma the prostate in 67 year old male for continued androgen deprivation therapy as well as external beam treatment with I-125 interstitial implant for boost in 67 year old male  PLAN: at this time I agree with treating with curative intent with external beam radiation therapy plus androgen deprivation apy. I would treat his pelvic lymph nodes to 4500 cGy as well as his prostate to 5000 cGy and selective lymph nodes which are enlarged on PET/CTup to 5500 cGy all using IM RT treatment planning and delivery. Risks and benefits of treatment including increased lower urinary tract symptoms diarrhea fatigue alteration of blood counts and skin reaction all were discussed in detail with the patient. I would also boost his prostate using I-125 interstitial implant after completion of all external beam radiation. My entire treatment plan was discussed with the patient. I've personally set up and ordered CT simulation for early next week. Patient comprehends my treatment plan well.  I would like to take this opportunity to thank you for allowing me to participate in the care of your patient.Eulas Post  Gatlin Kittell, MD

## 2018-04-01 ENCOUNTER — Ambulatory Visit
Admission: RE | Admit: 2018-04-01 | Discharge: 2018-04-01 | Disposition: A | Discharge status: Home / Self Care | Payer: Medicare Other | Source: Ambulatory Visit | Attending: Radiation Oncology | Admitting: Radiation Oncology

## 2018-04-01 DIAGNOSIS — Z510 Encounter for antineoplastic radiation therapy: Secondary | ICD-10-CM | POA: Insufficient documentation

## 2018-04-01 DIAGNOSIS — C61 Malignant neoplasm of prostate: Secondary | ICD-10-CM | POA: Diagnosis not present

## 2018-04-03 DIAGNOSIS — Z510 Encounter for antineoplastic radiation therapy: Secondary | ICD-10-CM | POA: Diagnosis not present

## 2018-04-04 ENCOUNTER — Other Ambulatory Visit: Payer: Self-pay | Admitting: *Deleted

## 2018-04-04 DIAGNOSIS — C61 Malignant neoplasm of prostate: Secondary | ICD-10-CM

## 2018-04-10 ENCOUNTER — Ambulatory Visit
Admission: RE | Admit: 2018-04-10 | Discharge: 2018-04-10 | Disposition: A | Discharge status: Home / Self Care | Payer: Medicare Other | Source: Ambulatory Visit | Attending: Radiation Oncology | Admitting: Radiation Oncology

## 2018-04-14 ENCOUNTER — Ambulatory Visit
Admission: RE | Admit: 2018-04-14 | Discharge: 2018-04-14 | Disposition: A | Discharge status: Home / Self Care | Payer: Medicare Other | Source: Ambulatory Visit | Attending: Radiation Oncology | Admitting: Radiation Oncology

## 2018-04-14 DIAGNOSIS — Z510 Encounter for antineoplastic radiation therapy: Secondary | ICD-10-CM | POA: Diagnosis not present

## 2018-04-15 ENCOUNTER — Ambulatory Visit
Admission: RE | Admit: 2018-04-15 | Discharge: 2018-04-15 | Disposition: A | Discharge status: Home / Self Care | Payer: Medicare Other | Source: Ambulatory Visit | Attending: Radiation Oncology | Admitting: Radiation Oncology

## 2018-04-15 DIAGNOSIS — Z510 Encounter for antineoplastic radiation therapy: Secondary | ICD-10-CM | POA: Diagnosis not present

## 2018-04-16 ENCOUNTER — Ambulatory Visit
Admission: RE | Admit: 2018-04-16 | Discharge: 2018-04-16 | Disposition: A | Discharge status: Home / Self Care | Payer: Medicare Other | Source: Ambulatory Visit | Attending: Radiation Oncology | Admitting: Radiation Oncology

## 2018-04-16 DIAGNOSIS — Z510 Encounter for antineoplastic radiation therapy: Secondary | ICD-10-CM | POA: Diagnosis not present

## 2018-04-17 ENCOUNTER — Ambulatory Visit
Admission: RE | Admit: 2018-04-17 | Discharge: 2018-04-17 | Disposition: A | Discharge status: Home / Self Care | Payer: Medicare Other | Source: Ambulatory Visit | Attending: Radiation Oncology | Admitting: Radiation Oncology

## 2018-04-17 DIAGNOSIS — Z510 Encounter for antineoplastic radiation therapy: Secondary | ICD-10-CM | POA: Diagnosis not present

## 2018-04-18 ENCOUNTER — Encounter: Payer: Self-pay | Admitting: Oncology

## 2018-04-18 ENCOUNTER — Other Ambulatory Visit: Payer: Self-pay

## 2018-04-18 ENCOUNTER — Inpatient Hospital Stay (HOSPITAL_BASED_OUTPATIENT_CLINIC_OR_DEPARTMENT_OTHER): Payer: Medicare Other | Admitting: Oncology

## 2018-04-18 ENCOUNTER — Inpatient Hospital Stay: Payer: Medicare Other | Attending: Oncology

## 2018-04-18 ENCOUNTER — Inpatient Hospital Stay: Payer: Medicare Other

## 2018-04-18 ENCOUNTER — Ambulatory Visit
Admission: RE | Admit: 2018-04-18 | Discharge: 2018-04-18 | Disposition: A | Discharge status: Home / Self Care | Payer: Medicare Other | Source: Ambulatory Visit | Attending: Radiation Oncology | Admitting: Radiation Oncology

## 2018-04-18 VITALS — BP 180/98 | HR 66 | Temp 97.2°F | Resp 18 | Wt 189.7 lb

## 2018-04-18 DIAGNOSIS — C774 Secondary and unspecified malignant neoplasm of inguinal and lower limb lymph nodes: Secondary | ICD-10-CM | POA: Diagnosis not present

## 2018-04-18 DIAGNOSIS — IMO0001 Reserved for inherently not codable concepts without codable children: Secondary | ICD-10-CM

## 2018-04-18 DIAGNOSIS — C61 Malignant neoplasm of prostate: Secondary | ICD-10-CM | POA: Diagnosis present

## 2018-04-18 DIAGNOSIS — D696 Thrombocytopenia, unspecified: Secondary | ICD-10-CM | POA: Insufficient documentation

## 2018-04-18 DIAGNOSIS — R232 Flushing: Secondary | ICD-10-CM

## 2018-04-18 DIAGNOSIS — F17290 Nicotine dependence, other tobacco product, uncomplicated: Secondary | ICD-10-CM | POA: Insufficient documentation

## 2018-04-18 DIAGNOSIS — R9721 Rising PSA following treatment for malignant neoplasm of prostate: Secondary | ICD-10-CM

## 2018-04-18 DIAGNOSIS — Z79899 Other long term (current) drug therapy: Secondary | ICD-10-CM | POA: Insufficient documentation

## 2018-04-18 DIAGNOSIS — G4700 Insomnia, unspecified: Secondary | ICD-10-CM | POA: Insufficient documentation

## 2018-04-18 DIAGNOSIS — Z510 Encounter for antineoplastic radiation therapy: Secondary | ICD-10-CM | POA: Diagnosis not present

## 2018-04-18 DIAGNOSIS — G4709 Other insomnia: Secondary | ICD-10-CM

## 2018-04-18 DIAGNOSIS — R972 Elevated prostate specific antigen [PSA]: Secondary | ICD-10-CM

## 2018-04-18 DIAGNOSIS — Z79818 Long term (current) use of other agents affecting estrogen receptors and estrogen levels: Secondary | ICD-10-CM

## 2018-04-18 DIAGNOSIS — I10 Essential (primary) hypertension: Secondary | ICD-10-CM | POA: Insufficient documentation

## 2018-04-18 DIAGNOSIS — F17210 Nicotine dependence, cigarettes, uncomplicated: Secondary | ICD-10-CM

## 2018-04-18 LAB — COMPREHENSIVE METABOLIC PANEL
ALBUMIN: 4 g/dL (ref 3.5–5.0)
ALK PHOS: 83 U/L (ref 38–126)
ALT: 19 U/L (ref 0–44)
ANION GAP: 9 (ref 5–15)
AST: 31 U/L (ref 15–41)
BILIRUBIN TOTAL: 1 mg/dL (ref 0.3–1.2)
BUN: 31 mg/dL — ABNORMAL HIGH (ref 8–23)
CALCIUM: 10 mg/dL (ref 8.9–10.3)
CO2: 26 mmol/L (ref 22–32)
Chloride: 104 mmol/L (ref 98–111)
Creatinine, Ser: 1.47 mg/dL — ABNORMAL HIGH (ref 0.61–1.24)
GFR calc Af Amer: 56 mL/min — ABNORMAL LOW (ref >60)
GFR calc non Af Amer: 48 mL/min — ABNORMAL LOW (ref >60)
GLUCOSE: 103 mg/dL — AB (ref 70–99)
POTASSIUM: 4 mmol/L (ref 3.5–5.1)
Sodium: 139 mmol/L (ref 135–145)
Total Protein: 7 g/dL (ref 6.5–8.1)

## 2018-04-18 LAB — CBC WITH DIFFERENTIAL/PLATELET
BASOS PCT: 0 %
Basophils Absolute: 0 10*3/uL (ref 0–0.1)
EOS ABS: 0.2 10*3/uL (ref 0–0.7)
EOS PCT: 4 %
HCT: 37.1 % — ABNORMAL LOW (ref 40.0–52.0)
HEMOGLOBIN: 12.7 g/dL — AB (ref 13.0–18.0)
Lymphocytes Relative: 18 %
Lymphs Abs: 0.9 10*3/uL — ABNORMAL LOW (ref 1.0–3.6)
MCH: 33.8 pg (ref 26.0–34.0)
MCHC: 34.2 g/dL (ref 32.0–36.0)
MCV: 98.8 fL (ref 80.0–100.0)
MONO ABS: 0.4 10*3/uL (ref 0.2–1.0)
MONOS PCT: 7 %
NEUTROS PCT: 71 %
Neutro Abs: 3.7 10*3/uL (ref 1.4–6.5)
PLATELETS: 134 10*3/uL — AB (ref 150–440)
RBC: 3.76 MIL/uL — ABNORMAL LOW (ref 4.40–5.90)
RDW: 13.6 % (ref 11.5–14.5)
WBC: 5.2 10*3/uL (ref 3.8–10.6)

## 2018-04-18 LAB — PSA: Prostatic Specific Antigen: 3.83 ng/mL (ref 0.00–4.00)

## 2018-04-18 NOTE — Progress Notes (Signed)
Hematology/Oncology Follow up note [facility] Telephone:([phone] Fax:([phone]   Patient Care Team: [name], MD as PCP - General (Family Medicine)  REFERRING PROVIDER: [facility] CHIEF COMPLAINTS/REASON FOR VISIT:  Evaluation of elevated PSA  HISTORY OF PRESENTING ILLNESS:  Terry Mitchell is a  67 y.o.  male with PMH listed below who was referred to me for evaluation of elevated PSA. Patient was referred by primary care physician to urology due to elevated PSA at the level of 72.  Per note Patient was referred to urology for further management.  Staging imaging has been ordered including abdominal and pelvis CT with contrast and bone scan.  Patient was also given Mills Koller for treatment of clinical prostate cancer.  Patient reports that he was scheduled to return to urologist office for biopsy. Patient denies any dysuria, hematuria, fever or chills.  He lives by himself. Denies any bone pain.  Reports some soreness at the area of Roslyn shots, as well as hot flushes.  Otherwise no complaints.  #01/01/2018 Patient received Firmagon loading dose at urologist office  # patient had a prostate biopsy on 02/28/2018 pathology showed prostate adenocarcinoma with androgen deprivation treatment effect.  Adenocarcinoma is present in all core fragments with involvement of 50% or more of each core.  Perineural invasion is present. As patient has received Firmagon prior to biopsy, Gleason grade cannot be reliably assessed after androgen deprivation therapy.  #  discussed with patient's urologist Dr. Bernardo Heater who did patient's prostate biopsy.  He agrees that given Mills Koller prior to biopsy and staging images are not typical.  He agrees that PET scan is consistent with lymph node involvement.  He does not think patient is a good candidate for radical prostatectomy.  He agrees with me about the plan that definitive radiation is a good option.  #  September 2019 Started external Beam radiation  INTERVAL HISTORY Terry Mitchell is a 67 y.o. male who has above history reviewed by me today presents for follow-up visit for management of prostate cancer. Patient has been on ADT with Firmagon monthly. During the interval, he was referred to establish care with radiation oncology Dr. Baruch Gouty and has started on palliative external beam radiation.  He will also get prostate I-125 interstitial implant after completing or external beam radiation. Patient reports so far tolerating radiation well.  Denies any pain or diarrhea.  Patient continues to have hot flush.  He was given Effexor to manage his hot flash but patient not willing to take because he is concerned about side effects of antidepressants.  Denies any bone pain, abdominal pain or difficulty of urination.  Review of Systems  Constitutional: Negative for chills, fever, malaise/fatigue and weight loss.       Hot flush  HENT: Negative for ear discharge, ear pain, nosebleeds and sore throat.   Eyes: Negative for double vision, photophobia, pain, discharge and redness.  Respiratory: Negative for cough, hemoptysis, sputum production, shortness of breath and wheezing.   Cardiovascular: Negative for chest pain, palpitations, orthopnea, claudication and leg swelling.  Gastrointestinal: Negative for abdominal pain, blood in stool, heartburn, melena, nausea and vomiting.  Genitourinary: Negative for dysuria, flank pain and frequency.  Musculoskeletal: Negative for back pain, myalgias and neck pain.  Skin: Negative for itching and rash.  Neurological: Negative for dizziness, tingling, tremors, focal weakness, weakness and headaches.  Endo/Heme/Allergies: Negative for environmental allergies. Does not bruise/bleed easily.  Psychiatric/Behavioral: Negative for depression and hallucinations. The patient has insomnia.  MEDICAL HISTORY:  Past Medical History:  Diagnosis Date  . Depression      recently went on disability d/t diagnosis  . History of recent fall 01/2018   missed the last step on ladder, causing back discomfort  . Hypertension   . Prostate cancer ([place]) 12/2017   prostate    SURGICAL HISTORY: Past Surgical History:  Procedure Laterality Date  . BACK SURGERY  2001    2 rods and 6 screws in back  . Left shoulder surgery Left 1998   removed a piece of bone around collar bone  . PROSTATE BIOPSY N/A 02/28/2018   Procedure: PROSTATE BIOPSY;  Surgeon: Abbie Sons, MD;  Location: [facility] ORS;  Service: Urology;  Laterality: N/A;  . TRANSRECTAL ULTRASOUND N/A 02/28/2018   Procedure: TRANSRECTAL ULTRASOUND;  Surgeon: Abbie Sons, MD;  Location: [facility] ORS;  Service: Urology;  Laterality: N/A;    SOCIAL HISTORY: Social History   Socioeconomic History  . Marital status: Single    Spouse name: Not on file  . Number of children: 4  . Years of education: Not on file  . Highest education level: Not on file  Occupational History  . Occupation: disability  Social Needs  . Financial resource strain: Very hard  . Food insecurity:    Worry: Sometimes true    Inability: Often true  . Transportation needs:    Medical: No    Non-medical: Yes  Tobacco Use  . Smoking status: Current Every Day Smoker    Packs/day: 2.00    Types: Cigars  . Smokeless tobacco: Never Used  . Tobacco comment: 2 cigars  Substance and Sexual Activity  . Alcohol use: Yes    Alcohol/week: 2.0 standard drinks    Types: 2 Cans of beer per week    Comment: occasional  . Drug use: Yes    Types: Cocaine    Comment: rarely uses but did so in last month  . Sexual activity: Not Currently  Lifestyle  . Physical activity:    Days per week: 7 days    Minutes per session: 60 min  . Stress: Rather much  Relationships  . Social connections:    Talks on phone: Not on file    Gets together: Not on file    Attends religious service: Not on file    Active member of club or organization: Not on  file    Attends meetings of clubs or organizations: Not on file    Relationship status: Not on file  . Intimate partner violence:    Fear of current or ex partner: Not on file    Emotionally abused: Not on file    Physically abused: Not on file    Forced sexual activity: Not on file  Other Topics Concern  . Not on file  Social History Narrative  . Not on file    FAMILY HISTORY: Family History  Problem Relation Age of Onset  . Stroke Mother   . Kidney failure Mother   . Diabetes Maternal Aunt     ALLERGIES:  is allergic to penicillins and lactose intolerance (gi).  MEDICATIONS:  Current Outpatient Medications  Medication Sig Dispense Refill  . losartan (COZAAR) 50 MG tablet Take 50 mg by mouth daily. for high blood pressure  2  . venlafaxine (EFFEXOR) 37.5 MG tablet Take 1 tablet (37.5 mg total) by mouth daily. 30 tablet 0  . vitamin B-12 (CYANOCOBALAMIN) 500 MCG tablet Take 500 mcg by mouth 3 (three) times a  week.     No current facility-administered medications for this visit.      PHYSICAL EXAMINATION: ECOG PERFORMANCE STATUS: 0 - Asymptomatic Vitals:   04/18/18 0850  BP: (!) 180/98  Pulse: 66  Resp: 18  Temp: (!) 97.2 F (36.2 C)   Filed Weights   04/18/18 0850  Weight: 189 lb 11.2 oz (86 kg)    Physical Exam  Constitutional: He is oriented to person, place, and time. No distress.  HENT:  Head: Normocephalic and atraumatic.  Mouth/Throat: Oropharynx is clear and moist.  Eyes: Pupils are equal, round, and reactive to light. Conjunctivae and EOM are normal. No scleral icterus.  Neck: Normal range of motion. Neck supple.  Cardiovascular: Normal rate, regular rhythm and normal heart sounds.  Pulmonary/Chest: Effort normal. No respiratory distress. He has no wheezes.  Decreased breath sound bilaterally  Abdominal: Soft. Bowel sounds are normal. He exhibits no distension and no mass. There is no tenderness.  Musculoskeletal: Normal range of motion. He  exhibits no edema or deformity.  Lymphadenopathy:    He has no cervical adenopathy.  Neurological: He is alert and oriented to person, place, and time. No cranial nerve deficit. Coordination normal.  Skin: Skin is warm and dry. No rash noted. No erythema.  Psychiatric: He has a normal mood and affect. His behavior is normal. Thought content normal.  Anxious     LABORATORY DATA:  I have reviewed the data as listed Lab Results  Component Value Date   WBC 5.2 04/18/2018   HGB 12.7 (L) 04/18/2018   HCT 37.1 (L) 04/18/2018   MCV 98.8 04/18/2018   PLT 134 (L) 04/18/2018   Recent Labs    02/21/18 0152 03/05/18 0858 04/18/18 0812  NA 141 141 139  K 4.6 3.9 4.0  CL 108 107 104  CO2 28 25 26   GLUCOSE 130* 88 103*  BUN 31* 28* 31*  CREATININE 1.56* 1.46* 1.47*  CALCIUM 8.7* 9.3 10.0  GFRNONAA 45* 49* 48*  GFRAA 52* 56* 56*  PROT 5.2* 6.4* 7.0  ALBUMIN 3.1* 3.6 4.0  AST 76* 30 31  ALT 33 18 19  ALKPHOS 48 96 83  BILITOT 0.5 0.4 1.0   Iron/TIBC/Ferritin/ %Sat No results found for: IRON, TIBC, FERRITIN, IRONPCTSAT   RADIOGRAPHIC STUDIES: I have personally reviewed the radiological images as listed and agreed with the findings in the report. PET scan  Patient had an enlarged left external iliac node measuring 1.4 cm in short axis with maximum SUV 2.5.  Although activity level is relatively low, given the size of the lymph node suspicion is raised for the possibility of metastatic disease. There is also a 7 mm right external iliac lymph node and a 1.2 cm left inguinal lymph node demonstrate only low metabolic activity.  Not overtly enlarged.  [name], faint sclerosis in the right anterior sixth rib.  Neither has significant metabolic activity.  Significance uncertain. Benign-appearing rib fractures with associated low-grade activity along several right anterior ribs include the right ninth rib. Activity in the cecum  without associated CT abnormality.  Most likely benign.  ASSESSMENT & PLAN:  1. Prostate cancer ([place])   2. Androgen deprivation therapy   3. Vasomotor flushing   4. Other insomnia   5. Uncontrolled hypertension   Cancer Staging Prostate cancer [facility]) Staging form: Prostate, AJCC 8th Edition - Clinical stage from 03/19/2018: Stage IVA (cTX, cN1, cM0) - Signed by [name], MD on  03/19/2018  #Patient has started on external beam radiation and will also proceed with prostate implant after he finished external beam radiation. PSA has decreased to 10.62 on 03/05/2018.  Today's PSA pending. We have discussed at last visit that we are going to switch to Lupron injection every 3 months. I highly recommend patient to stay on androgen deprivation treatment given his high risk/lymph node metastasis. However due to the side effects of ADT including vasomotor flushing/insomnia, patient reluctant to receive Lupron shot today. We had a lengthy discussion about the rationale and the benefits of continuing androgen deprivation therapy. He wants to take a break and come back in 4 weeks to receive next dose of Lupron. He will follow-up with me in 4 months prior to the next Lupron treatment.  #Labs reviewed.  Thrombocytopenia likely secondary to radiation.  Continue monitor. #Vasomotor flushing, patient was provided Effexor and he is reluctant to take due to concerns of side effects related to antidepressant. #Uncontrolled hypertension.  Discussed the necessity of medication compliance.  He voices understanding. Orders Placed This Encounter  Procedures  . CBC with Differential/Platelet    Standing Status:   Future    Standing Expiration Date:   04/18/2019  . Comprehensive metabolic panel    Standing Status:   Future    Standing Expiration Date:   04/18/2019  . PSA    Standing Status:   Future    Standing Expiration Date:   04/19/2019    Total face to face encounter time for this patient visit was 25 min.  >50% of the time was  spent in counseling and coordination of care.   [name], MD, PhD Hematology Oncology [facility] at [facility] Pager- [phone] 04/18/2018

## 2018-04-18 NOTE — Progress Notes (Signed)
Patient here for follow up. He is still having hot flashes and is concerned because it doesn't seem to get better. Pt's blood pressure elevated, he states he did not take bp med this morning.

## 2018-04-19 LAB — TESTOSTERONE

## 2018-04-21 ENCOUNTER — Ambulatory Visit: Admission: RE | Admit: 2018-04-21 | Payer: Medicare Other | Source: Ambulatory Visit

## 2018-04-21 ENCOUNTER — Ambulatory Visit: Payer: Medicare Other

## 2018-04-22 ENCOUNTER — Ambulatory Visit
Admission: RE | Admit: 2018-04-22 | Discharge: 2018-04-22 | Disposition: A | Discharge status: Home / Self Care | Payer: Medicare HMO | Source: Ambulatory Visit | Attending: Radiation Oncology | Admitting: Radiation Oncology

## 2018-04-22 DIAGNOSIS — Z510 Encounter for antineoplastic radiation therapy: Secondary | ICD-10-CM | POA: Insufficient documentation

## 2018-04-22 DIAGNOSIS — C61 Malignant neoplasm of prostate: Secondary | ICD-10-CM | POA: Insufficient documentation

## 2018-04-23 ENCOUNTER — Ambulatory Visit
Admission: RE | Admit: 2018-04-23 | Discharge: 2018-04-23 | Disposition: A | Discharge status: Home / Self Care | Payer: Medicare HMO | Source: Ambulatory Visit | Attending: Radiation Oncology | Admitting: Radiation Oncology

## 2018-04-23 DIAGNOSIS — Z510 Encounter for antineoplastic radiation therapy: Secondary | ICD-10-CM | POA: Diagnosis not present

## 2018-04-24 ENCOUNTER — Inpatient Hospital Stay: Payer: Medicare HMO | Attending: Oncology

## 2018-04-24 DIAGNOSIS — C61 Malignant neoplasm of prostate: Secondary | ICD-10-CM | POA: Insufficient documentation

## 2018-04-25 ENCOUNTER — Ambulatory Visit
Admission: RE | Admit: 2018-04-25 | Discharge: 2018-04-25 | Disposition: A | Discharge status: Home / Self Care | Payer: Medicare HMO | Source: Ambulatory Visit | Attending: Radiation Oncology | Admitting: Radiation Oncology

## 2018-04-25 DIAGNOSIS — Z510 Encounter for antineoplastic radiation therapy: Secondary | ICD-10-CM | POA: Diagnosis not present

## 2018-04-28 ENCOUNTER — Telehealth: Payer: Self-pay | Admitting: Radiology

## 2018-04-28 ENCOUNTER — Other Ambulatory Visit: Payer: Self-pay | Admitting: Radiology

## 2018-04-28 ENCOUNTER — Ambulatory Visit
Admission: RE | Admit: 2018-04-28 | Discharge: 2018-04-28 | Disposition: A | Discharge status: Home / Self Care | Payer: Medicare HMO | Source: Ambulatory Visit | Attending: Radiation Oncology | Admitting: Radiation Oncology

## 2018-04-28 DIAGNOSIS — Z510 Encounter for antineoplastic radiation therapy: Secondary | ICD-10-CM | POA: Diagnosis not present

## 2018-04-28 DIAGNOSIS — C61 Malignant neoplasm of prostate: Secondary | ICD-10-CM

## 2018-04-28 NOTE — Telephone Encounter (Signed)
Patient has been scheduled for Prostate volume study with Dr Bernardo Heater on 05/20/2018 & Prostate seed implants on 06/17/2018.

## 2018-04-29 ENCOUNTER — Other Ambulatory Visit: Payer: Self-pay | Admitting: Radiology

## 2018-04-29 ENCOUNTER — Ambulatory Visit
Admission: RE | Admit: 2018-04-29 | Discharge: 2018-04-29 | Disposition: A | Discharge status: Home / Self Care | Payer: Medicare HMO | Source: Ambulatory Visit | Attending: Radiation Oncology | Admitting: Radiation Oncology

## 2018-04-29 DIAGNOSIS — Z510 Encounter for antineoplastic radiation therapy: Secondary | ICD-10-CM | POA: Diagnosis not present

## 2018-04-30 ENCOUNTER — Ambulatory Visit
Admission: RE | Admit: 2018-04-30 | Discharge: 2018-04-30 | Disposition: A | Discharge status: Home / Self Care | Payer: Medicare HMO | Source: Ambulatory Visit | Attending: Radiation Oncology | Admitting: Radiation Oncology

## 2018-04-30 DIAGNOSIS — Z510 Encounter for antineoplastic radiation therapy: Secondary | ICD-10-CM | POA: Diagnosis not present

## 2018-05-01 ENCOUNTER — Ambulatory Visit
Admission: RE | Admit: 2018-05-01 | Discharge: 2018-05-01 | Disposition: A | Discharge status: Home / Self Care | Payer: Medicare HMO | Source: Ambulatory Visit | Attending: Radiation Oncology | Admitting: Radiation Oncology

## 2018-05-01 DIAGNOSIS — Z510 Encounter for antineoplastic radiation therapy: Secondary | ICD-10-CM | POA: Diagnosis not present

## 2018-05-02 ENCOUNTER — Ambulatory Visit
Admission: RE | Admit: 2018-05-02 | Discharge: 2018-05-02 | Disposition: A | Discharge status: Home / Self Care | Payer: Medicare HMO | Source: Ambulatory Visit | Attending: Radiation Oncology | Admitting: Radiation Oncology

## 2018-05-02 DIAGNOSIS — Z510 Encounter for antineoplastic radiation therapy: Secondary | ICD-10-CM | POA: Diagnosis not present

## 2018-05-05 ENCOUNTER — Ambulatory Visit
Admission: RE | Admit: 2018-05-05 | Discharge: 2018-05-05 | Disposition: A | Discharge status: Home / Self Care | Payer: Medicare HMO | Source: Ambulatory Visit | Attending: Radiation Oncology | Admitting: Radiation Oncology

## 2018-05-05 DIAGNOSIS — Z510 Encounter for antineoplastic radiation therapy: Secondary | ICD-10-CM | POA: Diagnosis not present

## 2018-05-06 ENCOUNTER — Ambulatory Visit
Admission: RE | Admit: 2018-05-06 | Discharge: 2018-05-06 | Disposition: A | Discharge status: Home / Self Care | Payer: Medicare HMO | Source: Ambulatory Visit | Attending: Radiation Oncology | Admitting: Radiation Oncology

## 2018-05-06 DIAGNOSIS — Z510 Encounter for antineoplastic radiation therapy: Secondary | ICD-10-CM | POA: Diagnosis not present

## 2018-05-07 ENCOUNTER — Ambulatory Visit
Admission: RE | Admit: 2018-05-07 | Discharge: 2018-05-07 | Disposition: A | Discharge status: Home / Self Care | Payer: Medicare HMO | Source: Ambulatory Visit | Attending: Radiation Oncology | Admitting: Radiation Oncology

## 2018-05-07 DIAGNOSIS — Z510 Encounter for antineoplastic radiation therapy: Secondary | ICD-10-CM | POA: Diagnosis not present

## 2018-05-08 ENCOUNTER — Ambulatory Visit
Admission: RE | Admit: 2018-05-08 | Discharge: 2018-05-08 | Disposition: A | Discharge status: Home / Self Care | Payer: Medicare HMO | Source: Ambulatory Visit | Attending: Radiation Oncology | Admitting: Radiation Oncology

## 2018-05-08 ENCOUNTER — Inpatient Hospital Stay: Payer: Medicare HMO

## 2018-05-08 DIAGNOSIS — Z510 Encounter for antineoplastic radiation therapy: Secondary | ICD-10-CM | POA: Diagnosis not present

## 2018-05-08 DIAGNOSIS — C61 Malignant neoplasm of prostate: Secondary | ICD-10-CM | POA: Diagnosis present

## 2018-05-08 LAB — CBC
HEMATOCRIT: 36.3 % — AB (ref 39.0–52.0)
Hemoglobin: 11.9 g/dL — ABNORMAL LOW (ref 13.0–17.0)
MCH: 32.9 pg (ref 26.0–34.0)
MCHC: 32.8 g/dL (ref 30.0–36.0)
MCV: 100.3 fL — AB (ref 80.0–100.0)
PLATELETS: 129 10*3/uL — AB (ref 150–400)
RBC: 3.62 MIL/uL — ABNORMAL LOW (ref 4.22–5.81)
RDW: 12.8 % (ref 11.5–15.5)
WBC: 4.6 10*3/uL (ref 4.0–10.5)
nRBC: 0 % (ref 0.0–0.2)

## 2018-05-09 ENCOUNTER — Ambulatory Visit
Admission: RE | Admit: 2018-05-09 | Discharge: 2018-05-09 | Disposition: A | Discharge status: Home / Self Care | Payer: Medicare HMO | Source: Ambulatory Visit | Attending: Radiation Oncology | Admitting: Radiation Oncology

## 2018-05-09 DIAGNOSIS — Z510 Encounter for antineoplastic radiation therapy: Secondary | ICD-10-CM | POA: Diagnosis not present

## 2018-05-12 ENCOUNTER — Ambulatory Visit
Admission: RE | Admit: 2018-05-12 | Discharge: 2018-05-12 | Disposition: A | Discharge status: Home / Self Care | Payer: Medicare HMO | Source: Ambulatory Visit | Attending: Radiation Oncology | Admitting: Radiation Oncology

## 2018-05-12 DIAGNOSIS — Z510 Encounter for antineoplastic radiation therapy: Secondary | ICD-10-CM | POA: Diagnosis not present

## 2018-05-13 ENCOUNTER — Ambulatory Visit
Admission: RE | Admit: 2018-05-13 | Discharge: 2018-05-13 | Disposition: A | Discharge status: Home / Self Care | Payer: Medicare HMO | Source: Ambulatory Visit | Attending: Radiation Oncology | Admitting: Radiation Oncology

## 2018-05-13 DIAGNOSIS — Z510 Encounter for antineoplastic radiation therapy: Secondary | ICD-10-CM | POA: Diagnosis not present

## 2018-05-14 ENCOUNTER — Ambulatory Visit
Admission: RE | Admit: 2018-05-14 | Discharge: 2018-05-14 | Disposition: A | Discharge status: Home / Self Care | Payer: Medicare HMO | Source: Ambulatory Visit | Attending: Radiation Oncology | Admitting: Radiation Oncology

## 2018-05-14 DIAGNOSIS — Z510 Encounter for antineoplastic radiation therapy: Secondary | ICD-10-CM | POA: Diagnosis not present

## 2018-05-15 ENCOUNTER — Ambulatory Visit
Admission: RE | Admit: 2018-05-15 | Discharge: 2018-05-15 | Disposition: A | Discharge status: Home / Self Care | Payer: Medicare HMO | Source: Ambulatory Visit | Attending: Radiation Oncology | Admitting: Radiation Oncology

## 2018-05-15 DIAGNOSIS — Z510 Encounter for antineoplastic radiation therapy: Secondary | ICD-10-CM | POA: Diagnosis not present

## 2018-05-16 ENCOUNTER — Inpatient Hospital Stay: Payer: Medicare HMO

## 2018-05-16 ENCOUNTER — Ambulatory Visit: Payer: Medicare HMO

## 2018-05-16 ENCOUNTER — Ambulatory Visit
Admission: RE | Admit: 2018-05-16 | Discharge: 2018-05-16 | Disposition: A | Discharge status: Home / Self Care | Payer: Medicare HMO | Source: Ambulatory Visit | Attending: Radiation Oncology | Admitting: Radiation Oncology

## 2018-05-16 DIAGNOSIS — Z510 Encounter for antineoplastic radiation therapy: Secondary | ICD-10-CM | POA: Diagnosis not present

## 2018-05-19 ENCOUNTER — Ambulatory Visit: Payer: Medicare HMO

## 2018-05-19 ENCOUNTER — Ambulatory Visit
Admission: RE | Admit: 2018-05-19 | Discharge: 2018-05-19 | Disposition: A | Discharge status: Home / Self Care | Payer: Medicare HMO | Source: Ambulatory Visit | Attending: Radiation Oncology | Admitting: Radiation Oncology

## 2018-05-19 DIAGNOSIS — Z510 Encounter for antineoplastic radiation therapy: Secondary | ICD-10-CM | POA: Diagnosis not present

## 2018-05-20 ENCOUNTER — Ambulatory Visit
Admission: RE | Admit: 2018-05-20 | Discharge: 2018-05-20 | Disposition: A | Discharge status: Home / Self Care | Payer: Medicare HMO | Source: Ambulatory Visit | Attending: Radiation Oncology | Admitting: Radiation Oncology

## 2018-05-20 ENCOUNTER — Encounter: Payer: Self-pay | Admitting: Radiation Oncology

## 2018-05-20 ENCOUNTER — Other Ambulatory Visit: Payer: Self-pay

## 2018-05-20 ENCOUNTER — Ambulatory Visit: Admission: RE | Admit: 2018-05-20 | Payer: Medicare HMO | Source: Ambulatory Visit | Admitting: Urology

## 2018-05-20 DIAGNOSIS — R350 Frequency of micturition: Secondary | ICD-10-CM | POA: Insufficient documentation

## 2018-05-20 DIAGNOSIS — I10 Essential (primary) hypertension: Secondary | ICD-10-CM | POA: Insufficient documentation

## 2018-05-20 DIAGNOSIS — F17210 Nicotine dependence, cigarettes, uncomplicated: Secondary | ICD-10-CM | POA: Insufficient documentation

## 2018-05-20 DIAGNOSIS — C61 Malignant neoplasm of prostate: Secondary | ICD-10-CM | POA: Diagnosis not present

## 2018-05-20 DIAGNOSIS — F329 Major depressive disorder, single episode, unspecified: Secondary | ICD-10-CM | POA: Diagnosis not present

## 2018-05-20 DIAGNOSIS — Z79899 Other long term (current) drug therapy: Secondary | ICD-10-CM | POA: Insufficient documentation

## 2018-05-20 SURGERY — ULTRASOUND, PROSTATE, FOR VOLUME DETERMINATION
Anesthesia: Choice

## 2018-05-20 NOTE — Progress Notes (Deleted)
  The note originally documented on this encounter has been moved the the encounter in which it belongs.  

## 2018-05-20 NOTE — Progress Notes (Signed)
History and physical  Name: Terry Mitchell  MRN: [id number]  Date:   05/20/2018     DOB: Aug 11, 1950   This 67 y.o. male patient presents to the clinic forHistory and physical in anticipation of I-125 interstitial implant for stage IIB adenocarcinoma the prostate  REFERRING PHYSICIAN: [name], MD  CHIEF COMPLAINT:  Chief Complaint  Patient presents with  . Prostate Cancer    Pt is here for post volume study    DIAGNOSIS: The encounter diagnosis was Prostate cancer ([place]).   PREVIOUS INVESTIGATIONS:  Clinical notes reviewed Volume study performed  HPI: patient is a 67 year old male recently presented with a PSA of 72 had adenocarcinoma and all core biopsies 6 and total althoughGleason score cannot be determined from prior androgen deprivation therapy. PET CT scan demonstrated enlarged left external iliac nodes as well as right external iliac nodes in the left inguinal node lymph node not specific for malignancy. We treated him with external beam radiation therapy to his prostate and pelvic nodes boosting those areas of increased nodalstations which she has tolerated well. He is seen today for volume study in anticipation of I-125 interstitial implant for boost. He is otherwise doing well still has some urinary frequency and urgency.  PLANNED TREATMENT REGIMEN: I-125 interstitial implant for boost  PAST MEDICAL HISTORY:  has a past medical history of Depression, History of recent fall (01/2018), Hypertension, and Prostate cancer ([place]) (12/2017).    PAST SURGICAL HISTORY:  Past Surgical History:  Procedure Laterality Date  . BACK SURGERY  2001    2 rods and 6 screws in back  . Left shoulder surgery Left 1998   removed a piece of bone around collar bone  . PROSTATE BIOPSY N/A 02/28/2018   Procedure: PROSTATE BIOPSY;  Surgeon: Abbie Sons, MD;  Location: [facility] ORS;  Service: Urology;  Laterality: N/A;  . TRANSRECTAL ULTRASOUND N/A 02/28/2018   Procedure: TRANSRECTAL  ULTRASOUND;  Surgeon: Abbie Sons, MD;  Location: [facility] ORS;  Service: Urology;  Laterality: N/A;    FAMILY HISTORY: family history includes Diabetes in his maternal aunt; Kidney failure in his mother; Stroke in his mother.  SOCIAL HISTORY:  reports that he has been smoking cigars. He has been smoking about 2.00 packs per day. He has never used smokeless tobacco. He reports that he drinks about 2.0 standard drinks of alcohol per week. He reports that he has current or past drug history. Drug: Cocaine.  ALLERGIES: Penicillins and Lactose intolerance (gi)  MEDICATIONS:  Current Outpatient Medications  Medication Sig Dispense Refill  . losartan (COZAAR) 50 MG tablet Take 50 mg by mouth daily after breakfast. for high blood pressure  2  . vitamin B-12 (CYANOCOBALAMIN) 500 MCG tablet Take 500 mcg by mouth 3 (three) times a week.    . venlafaxine (EFFEXOR) 37.5 MG tablet Take 1 tablet (37.5 mg total) by mouth daily. (Patient not taking: Reported on 05/16/2018) 30 tablet 0   No current facility-administered medications for this encounter.     ECOG PERFORMANCE STATUS:  0 - Asymptomatic  REVIEW OF SYSTEMS:  Patient denies any weight loss, fatigue, weakness, fever, chills or night sweats. Patient denies any loss of vision, blurred vision. Patient denies any ringing  of the ears or hearing loss. No irregular heartbeat. Patient denies heart murmur or history of fainting. Patient denies any chest pain or pain radiating to her upper extremities. Patient denies any shortness of breath, difficulty breathing at night, cough or hemoptysis. Patient denies any swelling  in the lower legs. Patient denies any nausea vomiting, vomiting of blood, or coffee ground material in the vomitus. Patient denies any stomach pain. Patient states has had normal bowel movements no significant constipation or diarrhea. Patient denies any dysuria, hematuria or significant nocturia. Patient denies any problems walking, swelling in  the joints or loss of balance. Patient denies any skin changes, loss of hair or loss of weight. Patient denies any excessive worrying or anxiety or significant depression. Patient denies any problems with insomnia. Patient denies excessive thirst, polyuria, polydipsia. Patient denies any swollen glands, patient denies easy bruising or easy bleeding. Patient denies any recent infections, allergies or URI. Patient "s visual fields have not changed significantly in recent time.    PHYSICAL EXAM: There were no vitals taken for this visit. Well-developed well-nourished patient in NAD. HEENT reveals PERLA, EOMI, discs not visualized.  Oral cavity is clear. No oral mucosal lesions are identified. Neck is clear without evidence of cervical or supraclavicular adenopathy. Lungs are clear to A&P. Cardiac examination is essentially unremarkable with regular rate and rhythm without murmur rub or thrill. Abdomen is benign with no organomegaly or masses noted. Motor sensory and DTR levels are equal and symmetric in the upper and lower extremities. Cranial nerves II through XII are grossly intact. Proprioception is intact. No peripheral adenopathy or edema is identified. No motor or sensory levels are noted. Crude visual fields are within normal range.  LABORATORY DATA: pathology reports reviewed   RADIOLOGY RESULTS:ultrasound use for volume study today   IMPRESSION: t least stage IIb adenocarcinoma the prostate in 67 year old male  PLAN: his time patient is cleared to proceed with I-125 interstitial implant for boost.Biopsy was performed today C volume study note. He also completes his external beam radiation therapy today. Treatment planning and source placement will be determined on his current ultrasound study of his prostate. Risks and benefits including radiation safety precautions all were reviewed with the patient and his wife. They both seem to comprehend my treatment plan well.  I would like to take this  opportunity to thank you for allowing me to participate in the care of your patient.Noreene Filbert, MD

## 2018-05-20 NOTE — H&P (Signed)
Radiation Oncology Follow up Note  Name: Terry Mitchell   Date:   04/28/2018 MRN:  [id number] DOB: October 30, 1950    This 67 y.o. male presents to [facility] today for volume study in anticipation of I-125 interstitial implant for boost  REFERRING PROVIDER: No ref. provider found  HPI: patient is a 67 year old male with at least stage IIb adenocarcinoma the prostate Gleason score cannot be determined based on prior androgen deprivation therapy. He presented with a PSA of 72. He's been undergoing external beam radiation therapy to his prostate and pelvic nodes which is tolerated well he is seen today for volume study in anticipation of I-125 interstitial implant for boost he continues on androgen deprivation therapy..  COMPLICATIONS OF TREATMENT: none  FOLLOW UP COMPLIANCE: keeps appointments   PHYSICAL EXAM:  There were no vitals taken for this visit. Well-developed well-nourished patient in NAD. HEENT reveals PERLA, EOMI, discs not visualized.  Oral cavity is clear. No oral mucosal lesions are identified. Neck is clear without evidence of cervical or supraclavicular adenopathy. Lungs are clear to A&P. Cardiac examination is essentially unremarkable with regular rate and rhythm without murmur rub or thrill. Abdomen is benign with no organomegaly or masses noted. Motor sensory and DTR levels are equal and symmetric in the upper and lower extremities. Cranial nerves II through XII are grossly intact. Proprioception is intact. No peripheral adenopathy or edema is identified. No motor or sensory levels are noted. Crude visual fields are within normal range.  RADIOLOGY RESULTS: ultrasound use for volume study  PLAN: Patient was taken to the cystoscopy suite in the OR. Patient was placed in the low lithotomy position. Foley catheter was placed. Trans-rectal ultrasound probe was inserted into the rectum and prostate seminal vesicles were visualized as well as bladder base. stepping images were  performed on a 5 mm increments. Images will be placed in [facility] treatment planning system to determine seed placement coordinates for eventual I-125 interstitial implant. Images will be reviewed with the physics and dosimetry staff for final quality approval. I personally was present for the volume study and assisted in delineation of contour volumes.  At the end of the procedure Foley catheter was removed, rectal ultrasound probe was removed. Patient tolerated his procedures extremely well with no side effects or complaints. Patient has given appointment for interstitial implant date. Consent was signed today as well as history and physical performed in preparation for his outpatient surgical implant.      Noreene Filbert, MD

## 2018-05-21 ENCOUNTER — Ambulatory Visit: Payer: Medicare HMO

## 2018-05-22 ENCOUNTER — Telehealth: Payer: Self-pay | Admitting: *Deleted

## 2018-05-22 NOTE — Telephone Encounter (Signed)
Dr. Angelene Giovanni [phone]

## 2018-05-22 NOTE — Telephone Encounter (Signed)
Dr. Angelene Giovanni, MD left a voice mail message on the triage line for Dr. Noreene Filbert, MD. He wants Dr. Baruch Gouty to call him back for a Peer to Peer review regarding this patient.      dhs

## 2018-06-10 ENCOUNTER — Inpatient Hospital Stay: Admission: RE | Admit: 2018-06-10 | Payer: Medicare HMO | Source: Ambulatory Visit

## 2018-06-16 ENCOUNTER — Ambulatory Visit: Payer: Medicare HMO | Admitting: Anesthesiology

## 2018-06-16 ENCOUNTER — Telehealth: Payer: Self-pay | Admitting: Urology

## 2018-06-16 ENCOUNTER — Encounter
Admission: RE | Admit: 2018-06-16 | Discharge: 2018-06-16 | Disposition: A | Discharge status: Home / Self Care | Payer: Medicare HMO | Source: Ambulatory Visit | Attending: Urology | Admitting: Urology

## 2018-06-16 ENCOUNTER — Other Ambulatory Visit: Payer: Self-pay

## 2018-06-16 ENCOUNTER — Ambulatory Visit
Admission: RE | Admit: 2018-06-16 | Discharge: 2018-06-16 | Disposition: A | Discharge status: Home / Self Care | Payer: Medicare HMO | Source: Ambulatory Visit | Attending: Urology | Admitting: Urology

## 2018-06-16 ENCOUNTER — Encounter: Payer: Self-pay | Admitting: Anesthesiology

## 2018-06-16 DIAGNOSIS — I517 Cardiomegaly: Secondary | ICD-10-CM | POA: Diagnosis not present

## 2018-06-16 DIAGNOSIS — R001 Bradycardia, unspecified: Secondary | ICD-10-CM | POA: Insufficient documentation

## 2018-06-16 DIAGNOSIS — R9431 Abnormal electrocardiogram [ECG] [EKG]: Secondary | ICD-10-CM | POA: Diagnosis not present

## 2018-06-16 DIAGNOSIS — Z01810 Encounter for preprocedural cardiovascular examination: Secondary | ICD-10-CM

## 2018-06-16 DIAGNOSIS — J449 Chronic obstructive pulmonary disease, unspecified: Secondary | ICD-10-CM | POA: Insufficient documentation

## 2018-06-16 DIAGNOSIS — Z01811 Encounter for preprocedural respiratory examination: Secondary | ICD-10-CM | POA: Diagnosis present

## 2018-06-16 LAB — COMPREHENSIVE METABOLIC PANEL
ALT: 18 U/L (ref 0–44)
AST: 29 U/L (ref 15–41)
Albumin: 4.1 g/dL (ref 3.5–5.0)
Alkaline Phosphatase: 70 U/L (ref 38–126)
Anion gap: 10 (ref 5–15)
BUN: 32 mg/dL — ABNORMAL HIGH (ref 8–23)
CO2: 25 mmol/L (ref 22–32)
Calcium: 9.8 mg/dL (ref 8.9–10.3)
Chloride: 105 mmol/L (ref 98–111)
Creatinine, Ser: 1.48 mg/dL — ABNORMAL HIGH (ref 0.61–1.24)
GFR calc non Af Amer: 48 mL/min — ABNORMAL LOW (ref >60)
GFR, EST AFRICAN AMERICAN: 56 mL/min — AB (ref >60)
Glucose, Bld: 96 mg/dL (ref 70–99)
POTASSIUM: 3.9 mmol/L (ref 3.5–5.1)
Sodium: 140 mmol/L (ref 135–145)
Total Bilirubin: 0.8 mg/dL (ref 0.3–1.2)
Total Protein: 6.8 g/dL (ref 6.5–8.1)

## 2018-06-16 LAB — APTT: aPTT: 29 seconds (ref 24–36)

## 2018-06-16 LAB — CBC
HCT: 39.5 % (ref 39.0–52.0)
Hemoglobin: 12.5 g/dL — ABNORMAL LOW (ref 13.0–17.0)
MCH: 32.6 pg (ref 26.0–34.0)
MCHC: 31.6 g/dL (ref 30.0–36.0)
MCV: 103.1 fL — AB (ref 80.0–100.0)
NRBC: 0 % (ref 0.0–0.2)
PLATELETS: 157 10*3/uL (ref 150–400)
RBC: 3.83 MIL/uL — ABNORMAL LOW (ref 4.22–5.81)
RDW: 13 % (ref 11.5–15.5)
WBC: 4.5 10*3/uL (ref 4.0–10.5)

## 2018-06-16 LAB — PROTIME-INR
INR: 1.01
PROTHROMBIN TIME: 13.2 s (ref 11.4–15.2)

## 2018-06-16 IMAGING — CR DG CHEST 2V
1 series · 2 of 2 positions shown · non-contrast
Comparison: Portable chest x-ray [DATE]

CLINICAL DATA: Preoperative examination. History of prostate
malignancy undergoing radiation therapy. Previous left clavicle and
shoulder fracture in [DATE]. Current smoker. No pneumonia, CHF,
nor other acute cardiopulmonary abnormality.

EXAM:
CHEST - 2 VIEW

[Series 1: dg chest 2 view · 0.14mm/px · 2 of 2 slices shown]
[im 1/2]
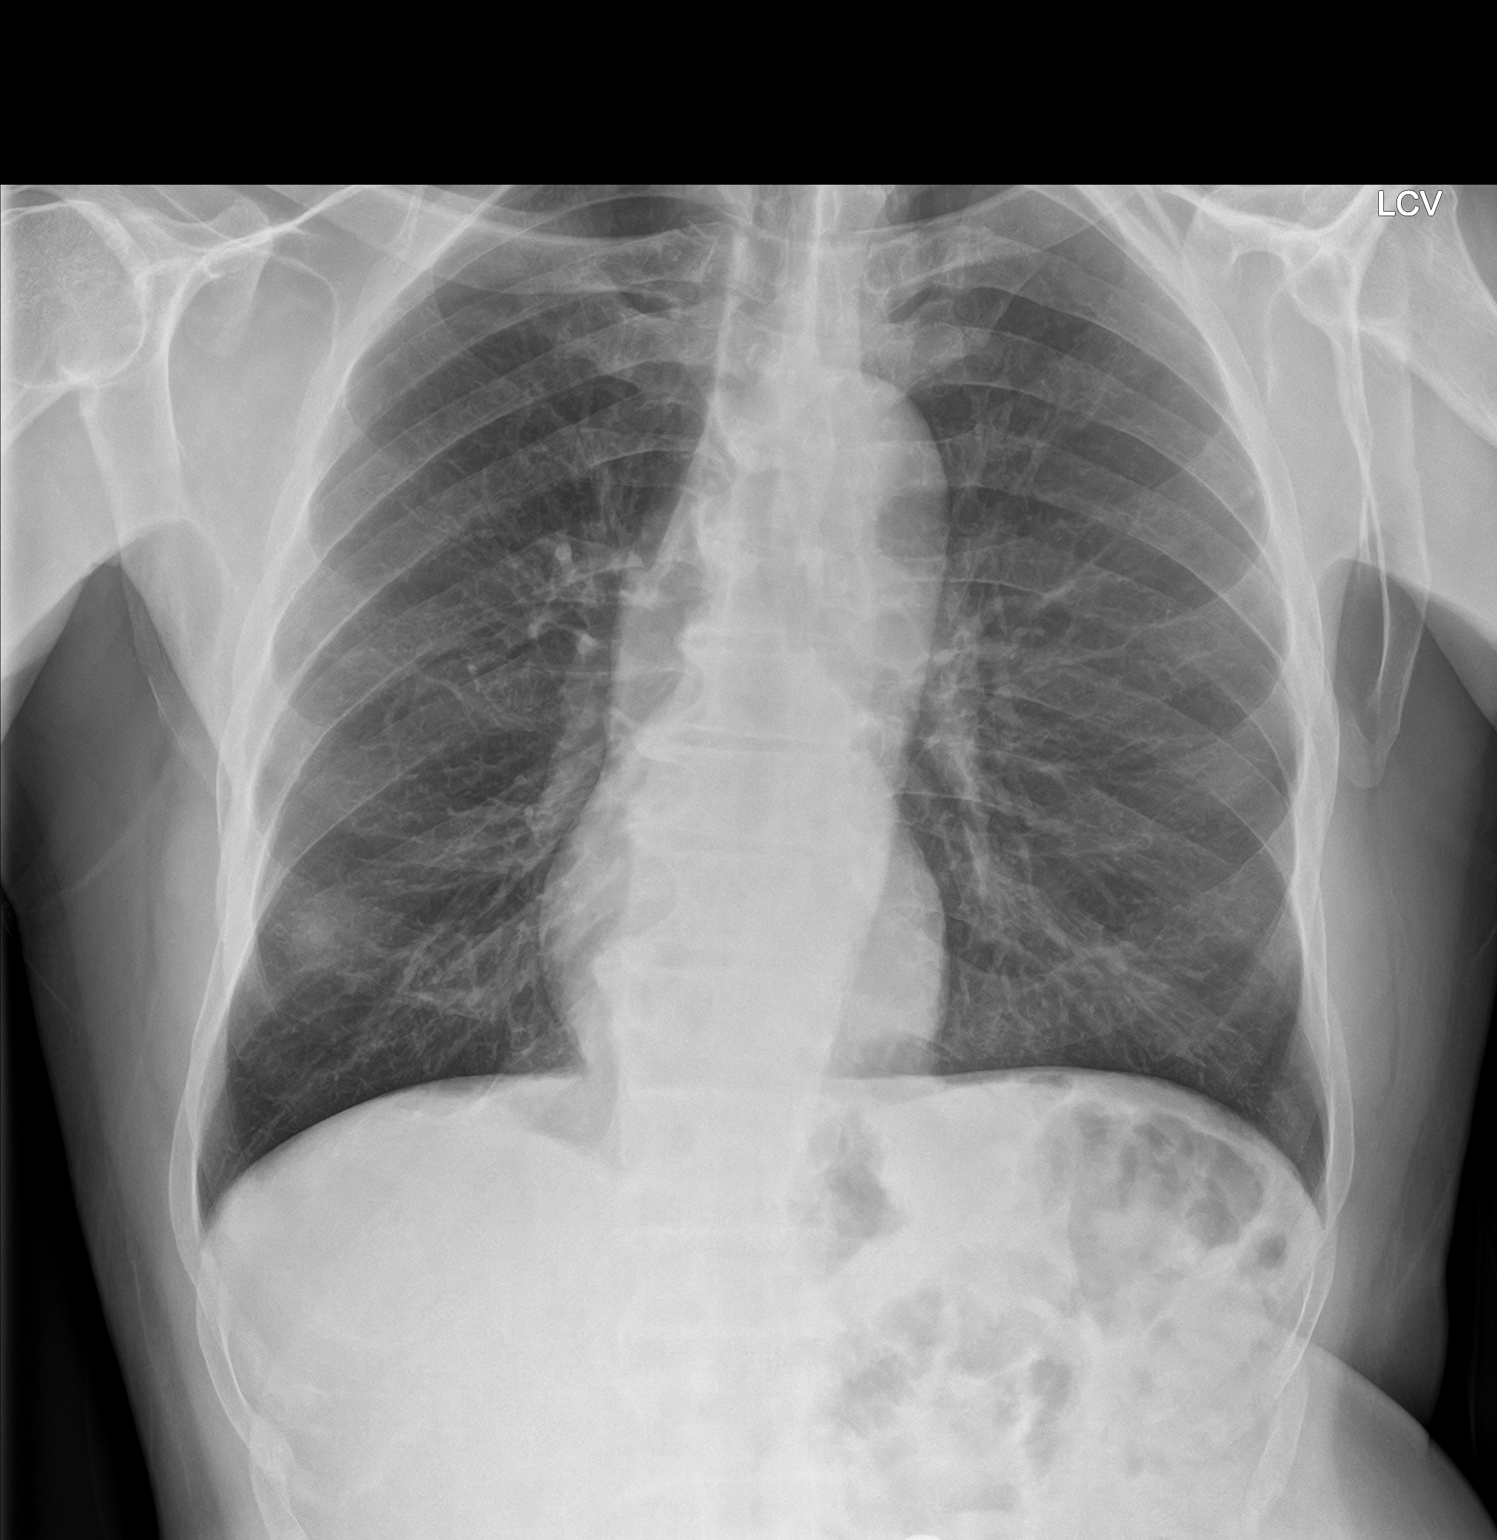
[im 2/2]
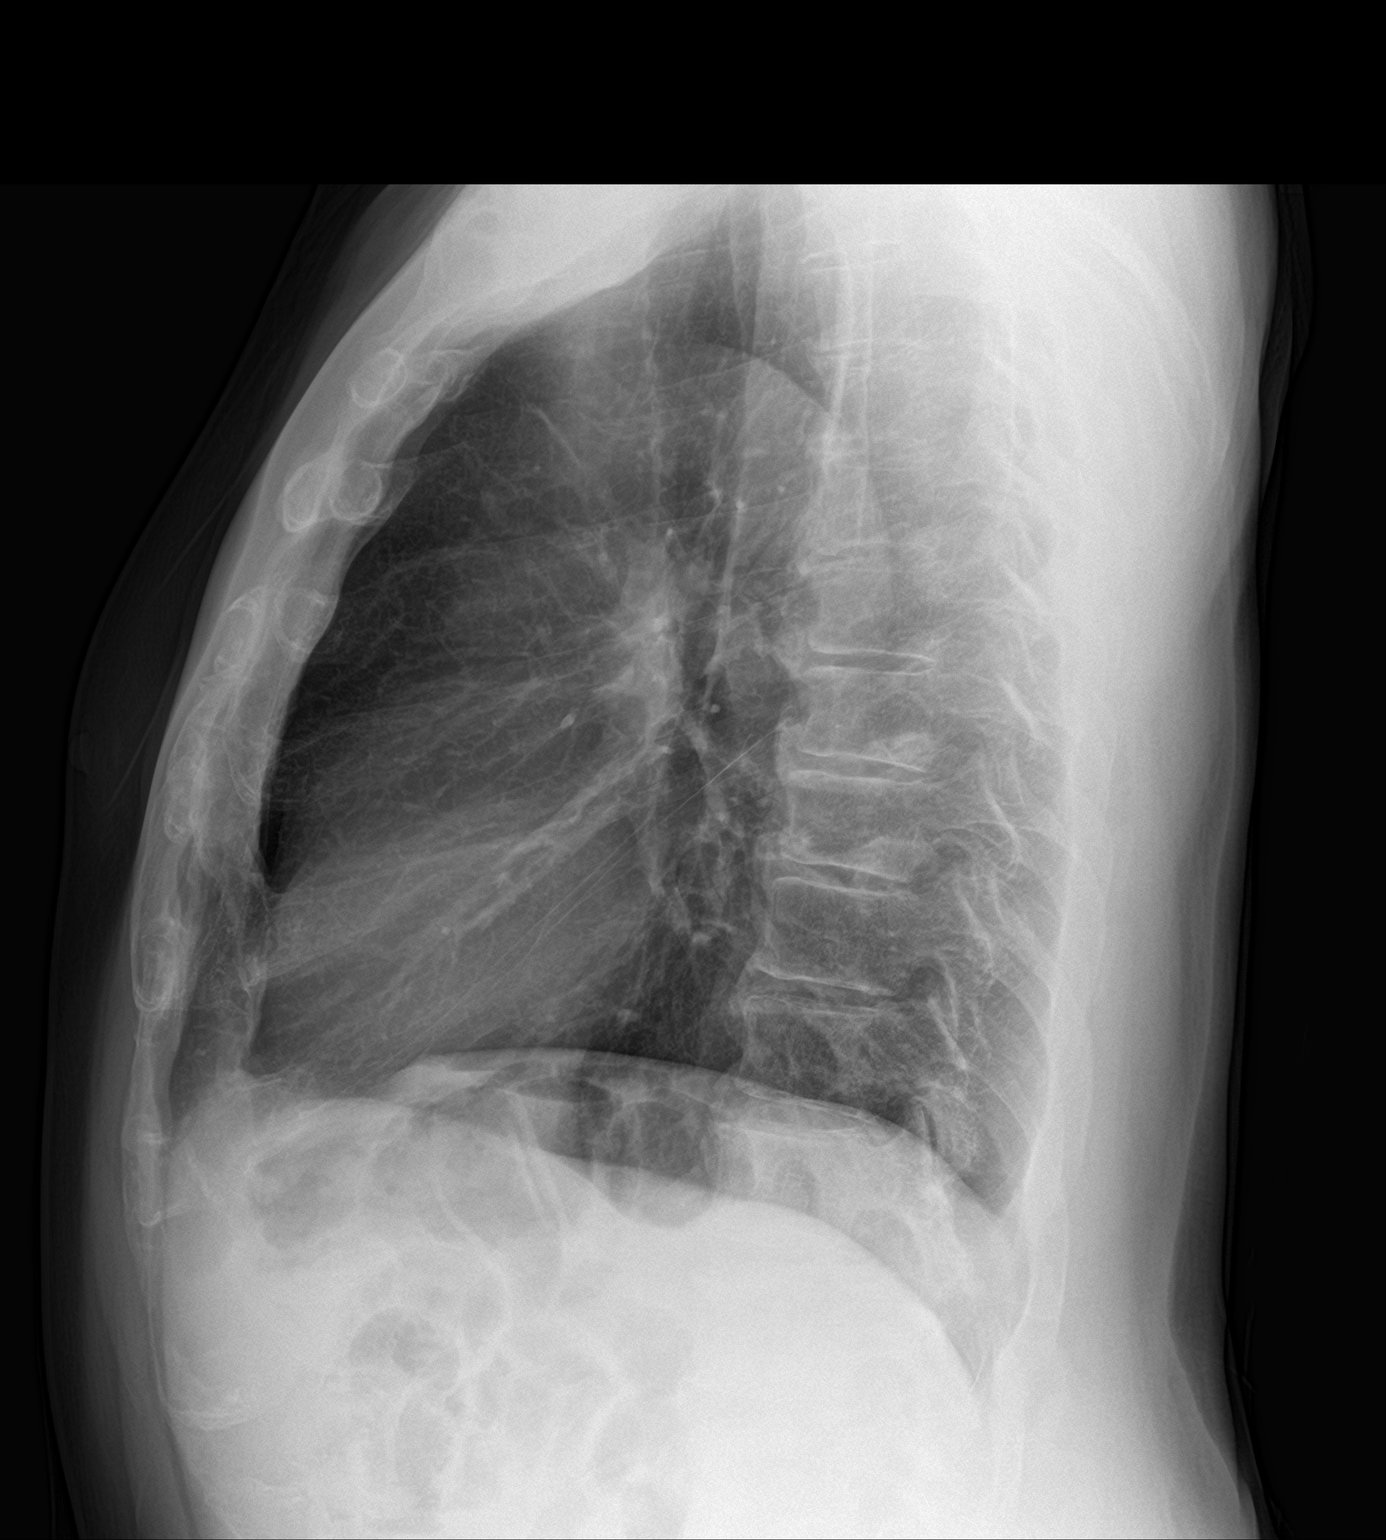

[2 of 2 positions shown; findings below may reference images not displayed]

FINDINGS: The lungs are hyperinflated. There is no focal infiltrate. There is
no pleural effusion or pneumothorax. The heart and pulmonary
vascularity are normal. The mediastinum is normal in width. The bony
thorax exhibits no acute abnormality.
IMPRESSION: COPD. No pneumonia, CHF, nor other acute cardiopulmonary
abnormality.

## 2018-06-16 MED ORDER — CIPROFLOXACIN IN D5W 400 MG/200ML IV SOLN: 400.0000 mg | INTRAVENOUS | Status: DC

## 2018-06-16 NOTE — Telephone Encounter (Signed)
I got a call from Xray today that they needed a verbal for the patient so that he could have his pre op chest xray today. Can you go in a sign the order please? Okie gave them a verbal over the phone so that they could go ahead and do it today.   Thanks,   Sharyn Lull

## 2018-06-16 NOTE — Patient Instructions (Addendum)
Your procedure is scheduled on: 06/17/18 Report to [place]. To find out your arrival time please call [phone] between 1PM - 3PM on TODAY.  Remember: Instructions that are not followed completely may result in serious medical risk, up to and including death, or upon the discretion of your surgeon and anesthesiologist your surgery may need to be rescheduled.     _X__ 1. Do not eat food after midnight the night before your procedure.                 No gum chewing or hard candies. You may drink clear liquids up to 2 hours                 before you are scheduled to arrive for your surgery- DO not drink clear                 liquids within 2 hours of the start of your surgery.                 Clear Liquids include:  water, apple juice without pulp, clear carbohydrate                 drink such as Clearfast or Gatorade, Black Coffee or Tea (Do not add                 anything to coffee or tea).  __X__2.  On the morning of surgery brush your teeth with toothpaste and water, you                 may rinse your mouth with mouthwash if you wish.  Do not swallow any              toothpaste of mouthwash.     _X__ 3.  No Alcohol for 24 hours before or after surgery.   _X__ 4.  Do Not Smoke or use e-cigarettes For 24 Hours Prior to Your Surgery.                 Do not use any chewable tobacco products for at least 6 hours prior to                 surgery.  ____  5.  Bring all medications with you on the day of surgery if instructed.   __X__  6.  Notify your doctor if there is any change in your medical condition      (cold, fever, infections).     Do not wear jewelry, make-up, hairpins, clips or nail polish. Do not wear lotions, powders, or perfumes.  Do not shave 48 hours prior to surgery. Men may shave face and neck. Do not bring valuables to the hospital.    [facility] is not responsible for any belongings or  valuables.  Contacts, dentures/partials or body piercings may not be worn into surgery. Bring a case for your contacts, glasses or hearing aids, a denture cup will be supplied. Leave your suitcase in the car. After surgery it may be brought to your room. For patients admitted to the hospital, discharge time is determined by your treatment team.   Patients discharged the day of surgery will not be allowed to drive home.   Please read over the following fact sheets that you were given:   MRSA Information  __X__ Take these medicines the morning of surgery with A SIP OF WATER:    1. NONE (  BUT YOU NEED TO MAKE SURE YOU TAKE YOUR BLOOD PRESSURE MEDICINE TODAY)  2.   3.   4.  5.  6.  __X__ Fleet Enema (as directed)   __X__ Use CHG Soap/SAGE wipes as directed  ____ Use inhalers on the day of surgery  ____ Stop metformin/Janumet/Farxiga 2 days prior to surgery    ____ Take 1/2 of usual insulin dose the night before surgery. No insulin the morning          of surgery.   ____ Stop Blood Thinners Coumadin/Plavix/Xarelto/Pleta/Pradaxa/Eliquis/Effient/Aspirin  on   Or contact your Surgeon, Cardiologist or Medical Doctor regarding  ability to stop your blood thinners  __X__ Stop Anti-inflammatories 7 days before surgery such as Advil, Ibuprofen, Motrin,  BC or Goodies Powder, Naprosyn, Naproxen, Aleve, Aspirin    __X__ Stop all herbal supplements, fish oil or vitamin E until after surgery.    ____ Bring C-Pap to the hospital.

## 2018-06-17 ENCOUNTER — Ambulatory Visit
Admission: RE | Admit: 2018-06-17 | Discharge: 2018-06-17 | Disposition: A | Discharge status: Home / Self Care | Payer: Medicare HMO | Source: Ambulatory Visit | Attending: Urology | Admitting: Urology

## 2018-06-17 ENCOUNTER — Ambulatory Visit: Payer: Medicare HMO

## 2018-06-17 ENCOUNTER — Encounter
Admission: RE | Disposition: A | Discharge status: Home / Self Care | Payer: Self-pay | Source: Ambulatory Visit | Attending: Urology

## 2018-06-17 ENCOUNTER — Other Ambulatory Visit: Payer: Self-pay

## 2018-06-17 ENCOUNTER — Encounter: Payer: Self-pay | Admitting: *Deleted

## 2018-06-17 DIAGNOSIS — J449 Chronic obstructive pulmonary disease, unspecified: Secondary | ICD-10-CM | POA: Insufficient documentation

## 2018-06-17 DIAGNOSIS — C61 Malignant neoplasm of prostate: Secondary | ICD-10-CM | POA: Insufficient documentation

## 2018-06-17 DIAGNOSIS — F17200 Nicotine dependence, unspecified, uncomplicated: Secondary | ICD-10-CM | POA: Diagnosis not present

## 2018-06-17 DIAGNOSIS — Z5309 Procedure and treatment not carried out because of other contraindication: Secondary | ICD-10-CM | POA: Insufficient documentation

## 2018-06-17 DIAGNOSIS — I10 Essential (primary) hypertension: Secondary | ICD-10-CM | POA: Insufficient documentation

## 2018-06-17 LAB — URINE DRUG SCREEN, QUALITATIVE (ARMC ONLY)
AMPHETAMINES, UR SCREEN: NOT DETECTED
Barbiturates, Ur Screen: NOT DETECTED
Benzodiazepine, Ur Scrn: NOT DETECTED
Cannabinoid 50 Ng, Ur ~~LOC~~: NOT DETECTED
Cocaine Metabolite,Ur ~~LOC~~: POSITIVE — AB
MDMA (ECSTASY) UR SCREEN: NOT DETECTED
METHADONE SCREEN, URINE: NOT DETECTED
Opiate, Ur Screen: NOT DETECTED
Phencyclidine (PCP) Ur S: NOT DETECTED
TRICYCLIC, UR SCREEN: NOT DETECTED

## 2018-06-17 SURGERY — INSERTION, RADIATION SOURCE, PROSTATE
Anesthesia: General

## 2018-06-17 MED ORDER — LIDOCAINE HCL (PF) 2 % IJ SOLN
INTRAMUSCULAR | Status: AC
  Filled 2018-06-17: qty 10

## 2018-06-17 MED ORDER — FAMOTIDINE 20 MG PO TABS
ORAL_TABLET | ORAL | Status: AC
  Filled 2018-06-17: qty 1

## 2018-06-17 MED ORDER — DEXAMETHASONE SODIUM PHOSPHATE 10 MG/ML IJ SOLN
INTRAMUSCULAR | Status: AC
  Filled 2018-06-17: qty 1

## 2018-06-17 MED ORDER — FAMOTIDINE 20 MG PO TABS
20.0000 mg | ORAL_TABLET | Freq: Once | ORAL | Status: AC
  Administered 2018-06-17: 20 mg via ORAL

## 2018-06-17 MED ORDER — ROCURONIUM BROMIDE 50 MG/5ML IV SOLN
INTRAVENOUS | Status: AC
  Filled 2018-06-17: qty 1

## 2018-06-17 MED ORDER — CIPROFLOXACIN IN D5W 400 MG/200ML IV SOLN
INTRAVENOUS | Status: AC
  Filled 2018-06-17: qty 200

## 2018-06-17 MED ORDER — PROPOFOL 10 MG/ML IV BOLUS
INTRAVENOUS | Status: AC
  Filled 2018-06-17: qty 20

## 2018-06-17 MED ORDER — LACTATED RINGERS IV SOLN
INTRAVENOUS | Status: DC
  Administered 2018-06-17: 07:00:00 via INTRAVENOUS

## 2018-06-17 MED ORDER — MIDAZOLAM HCL 2 MG/2ML IJ SOLN
INTRAMUSCULAR | Status: AC
  Filled 2018-06-17: qty 2

## 2018-06-17 MED ORDER — FLEET ENEMA 7-19 GM/118ML RE ENEM: 1.0000 | ENEMA | Freq: Once | RECTAL | Status: DC

## 2018-06-17 MED ORDER — ONDANSETRON HCL 4 MG/2ML IJ SOLN
INTRAMUSCULAR | Status: AC
  Filled 2018-06-17: qty 2

## 2018-06-17 MED ORDER — FENTANYL CITRATE (PF) 100 MCG/2ML IJ SOLN
INTRAMUSCULAR | Status: AC
  Filled 2018-06-17: qty 2

## 2018-06-17 MED ORDER — SUCCINYLCHOLINE CHLORIDE 20 MG/ML IJ SOLN
INTRAMUSCULAR | Status: AC
  Filled 2018-06-17: qty 1

## 2018-06-17 SURGICAL SUPPLY — 22 items
BAG URINE DRAINAGE (UROLOGICAL SUPPLIES) ×3 IMPLANT
BLADE CLIPPER SURG (BLADE) ×3 IMPLANT
CATH FOL 2WAY LX 16X5 (CATHETERS) ×3 IMPLANT
COVER WAND RF STERILE (DRAPES) ×3 IMPLANT
DRAPE INCISE 23X17 IOBAN STRL (DRAPES) ×2
DRAPE INCISE IOBAN 23X17 STRL (DRAPES) ×1 IMPLANT
DRAPE TABLE BACK 80X90 (DRAPES) ×3 IMPLANT
DRAPE UNDER BUTTOCK W/FLU (DRAPES) ×3 IMPLANT
DRSG TELFA 3X8 NADH (GAUZE/BANDAGES/DRESSINGS) ×3 IMPLANT
GLOVE BIO SURGEON STRL SZ7.5 (GLOVE) ×6 IMPLANT
GLOVE BIO SURGEON STRL SZ8 (GLOVE) ×3 IMPLANT
GOWN STRL REUS W/ TWL LRG LVL3 (GOWN DISPOSABLE) ×2 IMPLANT
GOWN STRL REUS W/ TWL XL LVL3 (GOWN DISPOSABLE) ×2 IMPLANT
GOWN STRL REUS W/TWL LRG LVL3 (GOWN DISPOSABLE) ×4
GOWN STRL REUS W/TWL XL LVL3 (GOWN DISPOSABLE) ×4
KIT TURNOVER CYSTO (KITS) ×3 IMPLANT
PACK CYSTO AR (MISCELLANEOUS) ×3 IMPLANT
SET CYSTO W/LG BORE CLAMP LF (SET/KITS/TRAYS/PACK) ×3 IMPLANT
SURGILUBE 2OZ TUBE FLIPTOP (MISCELLANEOUS) ×3 IMPLANT
SYR 10ML LL (SYRINGE) ×3 IMPLANT
SYRINGE IRR TOOMEY STRL 70CC (SYRINGE) IMPLANT
WATER STERILE IRR 1000ML POUR (IV SOLUTION) ×3 IMPLANT

## 2018-06-17 NOTE — Anesthesia Preprocedure Evaluation (Signed)
Anesthesia Evaluation  Patient identified by MRN, date of birth, ID band Patient awake    Reviewed: Allergy & Precautions, NPO status , Patient's Chart, lab work & pertinent test results, reviewed documented beta blocker date and time   Airway Mallampati: II  TM Distance: >3 FB     Dental  (+) Chipped   Pulmonary Current Smoker,           Cardiovascular hypertension, Pt. on medications      Neuro/Psych PSYCHIATRIC DISORDERS Depression    GI/Hepatic   Endo/Other    Renal/GU      Musculoskeletal   Abdominal   Peds  Hematology   Anesthesia Other Findings CXR shows COPD. EKG shows LVH.  Reproductive/Obstetrics                             Anesthesia Physical Anesthesia Plan  ASA: III  Anesthesia Plan: General   Post-op Pain Management:    Induction: Intravenous  PONV Risk Score and Plan:   Airway Management Planned: LMA  Additional Equipment:   Intra-op Plan:   Post-operative Plan:   Informed Consent: I have reviewed the patients History and Physical, chart, labs and discussed the procedure including the risks, benefits and alternatives for the proposed anesthesia with the patient or authorized representative who has indicated his/her understanding and acceptance.     Plan Discussed with: CRNA  Anesthesia Plan Comments:         Anesthesia Quick Evaluation

## 2018-06-17 NOTE — Progress Notes (Signed)
Patient drug screen positive for cocaine.  Patient instructed to call Dr. Dagoberto Reef office to reschedule. Patient educated on the importance of no drug use prior to procedure involving anesthesia.  Patent with verbal understanding.

## 2018-06-23 ENCOUNTER — Encounter: Payer: Self-pay | Admitting: Radiation Oncology

## 2018-06-23 ENCOUNTER — Ambulatory Visit
Admission: RE | Admit: 2018-06-23 | Discharge: 2018-06-23 | Disposition: A | Discharge status: Home / Self Care | Payer: Medicare HMO | Source: Ambulatory Visit | Attending: Radiation Oncology | Admitting: Radiation Oncology

## 2018-06-23 ENCOUNTER — Other Ambulatory Visit: Payer: Self-pay

## 2018-06-23 VITALS — BP 140/84 | HR 66 | Temp 96.2°F | Resp 16 | Wt 190.7 lb

## 2018-06-23 DIAGNOSIS — C61 Malignant neoplasm of prostate: Secondary | ICD-10-CM | POA: Diagnosis present

## 2018-06-23 DIAGNOSIS — R350 Frequency of micturition: Secondary | ICD-10-CM | POA: Insufficient documentation

## 2018-06-23 DIAGNOSIS — R3915 Urgency of urination: Secondary | ICD-10-CM | POA: Insufficient documentation

## 2018-06-23 NOTE — Progress Notes (Signed)
Radiation Oncology Follow up Note  Name: Terry Mitchell   Date:   06/23/2018 MRN:  [id number] DOB: Jul 13, 1951    This 67 y.o. male presents to the clinic today for reevaluation for consideration of small field boost to his prostate.  REFERRING PROVIDER: [name], MD  HPI: patient is a 67 year old male whose receive external beam radiation therapy to his prostate and pelvic nodes.for at least stage IIb adenocarcinoma the prostate.e presented with a PSA of 72. He had an androgen deprivation therapy so his prostate Gleason score cannot be analyzed. Our plan was to do external beam therapy to his prostate and pelvic nodes and boost his prostate with I-125 interstitial implant. Patient tested positive for cocaine canceling his surgical procedure. He is seen today for discussion of alternative treatment. He is still doing well very low side effect profile some minor urgency and frequency.  COMPLICATIONS OF TREATMENT: none  FOLLOW UP COMPLIANCE: keeps appointments   PHYSICAL EXAM:  BP 140/84 (BP Location: Left Arm, Patient Position: Sitting)   Pulse 66   Temp (!) 96.2 F (35.7 C) (Tympanic)   Resp 16   Wt 190 lb 11.2 oz (86.5 kg)   BMI 25.86 kg/m  Well-developed well-nourished patient in NAD. HEENT reveals PERLA, EOMI, discs not visualized.  Oral cavity is clear. No oral mucosal lesions are identified. Neck is clear without evidence of cervical or supraclavicular adenopathy. Lungs are clear to A&P. Cardiac examination is essentially unremarkable with regular rate and rhythm without murmur rub or thrill. Abdomen is benign with no organomegaly or masses noted. Motor sensory and DTR levels are equal and symmetric in the upper and lower extremities. Cranial nerves II through XII are grossly intact. Proprioception is intact. No peripheral adenopathy or edema is identified. No motor or sensory levels are noted. Crude visual fields are within normal range.  RADIOLOGY RESULTS: no current films  for review  PLAN: at this time like to go ahead with external beam radiation therapy to his prostate alone for boost. Would plan on delivering 3000 cGy over 3 weeks. Risks and benefits of treatment including again increased lower urinary tract symptoms diarrhea fatigue alteration of blood counts and skin reaction were discussed. I have personally separate ordered CT simulation for later this week and willstart at our boost as soon as possible. Patient and his wife both seem to comprehend my treatment plan well.  I would like to take this opportunity to thank you for allowing me to participate in the care of your patient.Noreene Filbert, MD

## 2018-06-24 ENCOUNTER — Ambulatory Visit: Payer: Medicare HMO

## 2018-06-26 ENCOUNTER — Ambulatory Visit
Admission: RE | Admit: 2018-06-26 | Discharge: 2018-06-26 | Disposition: A | Discharge status: Home / Self Care | Payer: Medicare HMO | Source: Ambulatory Visit | Attending: Radiation Oncology | Admitting: Radiation Oncology

## 2018-06-26 DIAGNOSIS — Z510 Encounter for antineoplastic radiation therapy: Secondary | ICD-10-CM | POA: Diagnosis not present

## 2018-06-26 DIAGNOSIS — C61 Malignant neoplasm of prostate: Secondary | ICD-10-CM | POA: Insufficient documentation

## 2018-07-02 DIAGNOSIS — Z510 Encounter for antineoplastic radiation therapy: Secondary | ICD-10-CM | POA: Diagnosis not present

## 2018-07-07 ENCOUNTER — Ambulatory Visit
Admission: RE | Admit: 2018-07-07 | Discharge: 2018-07-07 | Disposition: A | Discharge status: Home / Self Care | Payer: Medicare HMO | Source: Ambulatory Visit | Attending: Radiation Oncology | Admitting: Radiation Oncology

## 2018-07-08 ENCOUNTER — Ambulatory Visit
Admission: RE | Admit: 2018-07-08 | Discharge: 2018-07-08 | Disposition: A | Discharge status: Home / Self Care | Payer: Medicare HMO | Source: Ambulatory Visit | Attending: Radiation Oncology | Admitting: Radiation Oncology

## 2018-07-08 DIAGNOSIS — Z510 Encounter for antineoplastic radiation therapy: Secondary | ICD-10-CM | POA: Diagnosis not present

## 2018-07-09 ENCOUNTER — Ambulatory Visit
Admission: RE | Admit: 2018-07-09 | Discharge: 2018-07-09 | Disposition: A | Discharge status: Home / Self Care | Payer: Medicare HMO | Source: Ambulatory Visit | Attending: Radiation Oncology | Admitting: Radiation Oncology

## 2018-07-09 DIAGNOSIS — Z510 Encounter for antineoplastic radiation therapy: Secondary | ICD-10-CM | POA: Diagnosis not present

## 2018-07-10 ENCOUNTER — Ambulatory Visit
Admission: RE | Admit: 2018-07-10 | Discharge: 2018-07-10 | Disposition: A | Discharge status: Home / Self Care | Payer: Medicare HMO | Source: Ambulatory Visit | Attending: Radiation Oncology | Admitting: Radiation Oncology

## 2018-07-10 DIAGNOSIS — Z510 Encounter for antineoplastic radiation therapy: Secondary | ICD-10-CM | POA: Diagnosis not present

## 2018-07-11 ENCOUNTER — Ambulatory Visit
Admission: RE | Admit: 2018-07-11 | Discharge: 2018-07-11 | Disposition: A | Discharge status: Home / Self Care | Payer: Medicare HMO | Source: Ambulatory Visit | Attending: Radiation Oncology | Admitting: Radiation Oncology

## 2018-07-11 DIAGNOSIS — Z510 Encounter for antineoplastic radiation therapy: Secondary | ICD-10-CM | POA: Diagnosis not present

## 2018-07-14 ENCOUNTER — Ambulatory Visit: Payer: Medicare Other | Admitting: Radiation Oncology

## 2018-07-14 ENCOUNTER — Ambulatory Visit: Payer: Medicare HMO

## 2018-07-15 ENCOUNTER — Ambulatory Visit: Payer: Medicare HMO

## 2018-07-17 ENCOUNTER — Ambulatory Visit
Admission: RE | Admit: 2018-07-17 | Discharge: 2018-07-17 | Disposition: A | Discharge status: Home / Self Care | Payer: Medicare HMO | Source: Ambulatory Visit | Attending: Radiation Oncology | Admitting: Radiation Oncology

## 2018-07-17 DIAGNOSIS — Z510 Encounter for antineoplastic radiation therapy: Secondary | ICD-10-CM | POA: Diagnosis not present

## 2018-07-18 ENCOUNTER — Ambulatory Visit
Admission: RE | Admit: 2018-07-18 | Discharge: 2018-07-18 | Disposition: A | Discharge status: Home / Self Care | Payer: Medicare HMO | Source: Ambulatory Visit | Attending: Radiation Oncology | Admitting: Radiation Oncology

## 2018-07-18 DIAGNOSIS — Z510 Encounter for antineoplastic radiation therapy: Secondary | ICD-10-CM | POA: Diagnosis not present

## 2018-07-21 ENCOUNTER — Ambulatory Visit
Admission: RE | Admit: 2018-07-21 | Discharge: 2018-07-21 | Disposition: A | Discharge status: Home / Self Care | Payer: Medicare HMO | Source: Ambulatory Visit | Attending: Radiation Oncology | Admitting: Radiation Oncology

## 2018-07-21 DIAGNOSIS — Z510 Encounter for antineoplastic radiation therapy: Secondary | ICD-10-CM | POA: Diagnosis not present

## 2018-07-22 ENCOUNTER — Ambulatory Visit
Admission: RE | Admit: 2018-07-22 | Discharge: 2018-07-22 | Disposition: A | Discharge status: Home / Self Care | Payer: Medicare HMO | Source: Ambulatory Visit | Attending: Radiation Oncology | Admitting: Radiation Oncology

## 2018-07-22 DIAGNOSIS — Z510 Encounter for antineoplastic radiation therapy: Secondary | ICD-10-CM | POA: Diagnosis not present

## 2018-07-24 ENCOUNTER — Ambulatory Visit
Admission: RE | Admit: 2018-07-24 | Discharge: 2018-07-24 | Disposition: A | Discharge status: Home / Self Care | Payer: Medicare HMO | Source: Ambulatory Visit | Attending: Radiation Oncology | Admitting: Radiation Oncology

## 2018-07-24 DIAGNOSIS — C61 Malignant neoplasm of prostate: Secondary | ICD-10-CM | POA: Diagnosis not present

## 2018-07-24 DIAGNOSIS — Z510 Encounter for antineoplastic radiation therapy: Secondary | ICD-10-CM | POA: Diagnosis present

## 2018-07-25 ENCOUNTER — Ambulatory Visit
Admission: RE | Admit: 2018-07-25 | Discharge: 2018-07-25 | Disposition: A | Discharge status: Home / Self Care | Payer: Medicare HMO | Source: Ambulatory Visit | Attending: Radiation Oncology | Admitting: Radiation Oncology

## 2018-07-25 DIAGNOSIS — Z510 Encounter for antineoplastic radiation therapy: Secondary | ICD-10-CM | POA: Diagnosis not present

## 2018-07-28 ENCOUNTER — Ambulatory Visit
Admission: RE | Admit: 2018-07-28 | Discharge: 2018-07-28 | Disposition: A | Discharge status: Home / Self Care | Payer: Medicare HMO | Source: Ambulatory Visit | Attending: Radiation Oncology | Admitting: Radiation Oncology

## 2018-07-28 DIAGNOSIS — Z510 Encounter for antineoplastic radiation therapy: Secondary | ICD-10-CM | POA: Diagnosis not present

## 2018-07-29 ENCOUNTER — Ambulatory Visit
Admission: RE | Admit: 2018-07-29 | Discharge: 2018-07-29 | Disposition: A | Discharge status: Home / Self Care | Payer: Medicare HMO | Source: Ambulatory Visit | Attending: Radiation Oncology | Admitting: Radiation Oncology

## 2018-07-29 DIAGNOSIS — Z510 Encounter for antineoplastic radiation therapy: Secondary | ICD-10-CM | POA: Diagnosis not present

## 2018-07-30 ENCOUNTER — Ambulatory Visit
Admission: RE | Admit: 2018-07-30 | Discharge: 2018-07-30 | Disposition: A | Discharge status: Home / Self Care | Payer: Medicare HMO | Source: Ambulatory Visit | Attending: Radiation Oncology | Admitting: Radiation Oncology

## 2018-07-30 ENCOUNTER — Ambulatory Visit: Payer: Medicare HMO

## 2018-07-30 DIAGNOSIS — Z510 Encounter for antineoplastic radiation therapy: Secondary | ICD-10-CM | POA: Diagnosis not present

## 2018-07-31 ENCOUNTER — Ambulatory Visit: Payer: Medicare HMO

## 2018-08-01 ENCOUNTER — Ambulatory Visit
Admission: RE | Admit: 2018-08-01 | Discharge: 2018-08-01 | Disposition: A | Discharge status: Home / Self Care | Payer: Medicare HMO | Source: Ambulatory Visit | Attending: Radiation Oncology | Admitting: Radiation Oncology

## 2018-08-01 DIAGNOSIS — Z510 Encounter for antineoplastic radiation therapy: Secondary | ICD-10-CM | POA: Diagnosis not present

## 2018-08-04 ENCOUNTER — Ambulatory Visit
Admission: RE | Admit: 2018-08-04 | Discharge: 2018-08-04 | Disposition: A | Discharge status: Home / Self Care | Payer: Medicare HMO | Source: Ambulatory Visit | Attending: Radiation Oncology | Admitting: Radiation Oncology

## 2018-08-04 DIAGNOSIS — Z510 Encounter for antineoplastic radiation therapy: Secondary | ICD-10-CM | POA: Diagnosis not present

## 2018-08-15 ENCOUNTER — Inpatient Hospital Stay: Payer: Medicare HMO | Admitting: Oncology

## 2018-08-15 ENCOUNTER — Inpatient Hospital Stay: Payer: Medicare HMO

## 2018-08-22 ENCOUNTER — Inpatient Hospital Stay: Payer: Medicare HMO | Attending: Oncology

## 2018-08-22 ENCOUNTER — Inpatient Hospital Stay: Payer: Medicare HMO | Admitting: Oncology

## 2018-08-22 ENCOUNTER — Inpatient Hospital Stay: Payer: Medicare HMO

## 2018-08-22 DIAGNOSIS — R9721 Rising PSA following treatment for malignant neoplasm of prostate: Secondary | ICD-10-CM | POA: Diagnosis not present

## 2018-08-22 DIAGNOSIS — Z79818 Long term (current) use of other agents affecting estrogen receptors and estrogen levels: Secondary | ICD-10-CM

## 2018-08-22 DIAGNOSIS — G4709 Other insomnia: Secondary | ICD-10-CM

## 2018-08-22 DIAGNOSIS — C61 Malignant neoplasm of prostate: Secondary | ICD-10-CM | POA: Diagnosis not present

## 2018-08-22 DIAGNOSIS — R232 Flushing: Secondary | ICD-10-CM

## 2018-08-22 LAB — CBC WITH DIFFERENTIAL/PLATELET
Abs Immature Granulocytes: 0.01 10*3/uL (ref 0.00–0.07)
BASOS PCT: 1 %
Basophils Absolute: 0 10*3/uL (ref 0.0–0.1)
EOS ABS: 0.2 10*3/uL (ref 0.0–0.5)
Eosinophils Relative: 4 %
HCT: 35.9 % — ABNORMAL LOW (ref 39.0–52.0)
Hemoglobin: 11.5 g/dL — ABNORMAL LOW (ref 13.0–17.0)
IMMATURE GRANULOCYTES: 0 %
Lymphocytes Relative: 20 %
Lymphs Abs: 0.9 10*3/uL (ref 0.7–4.0)
MCH: 32.1 pg (ref 26.0–34.0)
MCHC: 32 g/dL (ref 30.0–36.0)
MCV: 100.3 fL — AB (ref 80.0–100.0)
MONOS PCT: 12 %
Monocytes Absolute: 0.5 10*3/uL (ref 0.1–1.0)
NEUTROS ABS: 2.8 10*3/uL (ref 1.7–7.7)
NEUTROS PCT: 63 %
NRBC: 0 % (ref 0.0–0.2)
PLATELETS: 147 10*3/uL — AB (ref 150–400)
RBC: 3.58 MIL/uL — ABNORMAL LOW (ref 4.22–5.81)
RDW: 12.7 % (ref 11.5–15.5)
WBC: 4.4 10*3/uL (ref 4.0–10.5)

## 2018-08-22 LAB — COMPREHENSIVE METABOLIC PANEL
ALK PHOS: 90 U/L (ref 38–126)
ALT: 16 U/L (ref 0–44)
ANION GAP: 9 (ref 5–15)
AST: 26 U/L (ref 15–41)
Albumin: 4.1 g/dL (ref 3.5–5.0)
BILIRUBIN TOTAL: 0.5 mg/dL (ref 0.3–1.2)
BUN: 30 mg/dL — ABNORMAL HIGH (ref 8–23)
CALCIUM: 9.7 mg/dL (ref 8.9–10.3)
CO2: 25 mmol/L (ref 22–32)
Chloride: 104 mmol/L (ref 98–111)
Creatinine, Ser: 1.51 mg/dL — ABNORMAL HIGH (ref 0.61–1.24)
GFR calc Af Amer: 55 mL/min — ABNORMAL LOW (ref >60)
GFR calc non Af Amer: 48 mL/min — ABNORMAL LOW (ref >60)
GLUCOSE: 106 mg/dL — AB (ref 70–99)
POTASSIUM: 4.3 mmol/L (ref 3.5–5.1)
Sodium: 138 mmol/L (ref 135–145)
Total Protein: 7.3 g/dL (ref 6.5–8.1)

## 2018-08-22 LAB — PSA: Prostatic Specific Antigen: 0.83 ng/mL (ref 0.00–4.00)

## 2018-08-23 LAB — TESTOSTERONE

## 2018-09-01 ENCOUNTER — Other Ambulatory Visit: Payer: Self-pay

## 2018-09-01 ENCOUNTER — Ambulatory Visit
Admission: RE | Admit: 2018-09-01 | Discharge: 2018-09-01 | Disposition: A | Discharge status: Home / Self Care | Payer: Medicare HMO | Source: Ambulatory Visit | Attending: Radiation Oncology | Admitting: Radiation Oncology

## 2018-09-01 ENCOUNTER — Encounter: Payer: Self-pay | Admitting: Radiation Oncology

## 2018-09-01 VITALS — BP 147/83 | HR 63 | Temp 96.8°F | Resp 16 | Wt 194.7 lb

## 2018-09-01 DIAGNOSIS — C61 Malignant neoplasm of prostate: Secondary | ICD-10-CM

## 2018-09-01 NOTE — Progress Notes (Signed)
Radiation Oncology Follow up Note  Name: Terry Mitchell   Date:   09/01/2018 MRN:  [id number] DOB: 12-21-1950    This 68 y.o. male presents to the clinic today for one-month follow-up status post external beam radiation therapy to his prostate for stage IIb adenocarcinoma the prostate.  REFERRING PROVIDER: [name], MD  HPI: patient is a 68 year old male.now out 1 month having completed radiation therapy for at least stage IIb adenocarcinoma the prostate presenting with a PSA of 72. He has been on androgen deprivation therapy.his exactly score cannot be determined from prior androgen deprivation therapy. He did have a PET CT scan showing enlarged left external iliac nodes. We treated in with I MRT radiation therapy to his pelvic nodes dose painting for slightly higher dose probable involved nodes. He was scheduled to have a I-125 interstitial implant although that cannot be performed and he underwent external beam I MRT prostate boost. He is seen today one month out is doing well. He specifically denies significant diarrhea does have some urgency and frequency of urination and nocturia 2-3. He is under no medication for his lower urinary tract symptoms.  COMPLICATIONS OF TREATMENT: none  FOLLOW UP COMPLIANCE: keeps appointments   PHYSICAL EXAM:  BP (!) 147/83 (BP Location: Left Arm, Patient Position: Sitting)   Pulse 63   Temp (!) 96.8 F (36 C) (Tympanic)   Resp 16   Wt 194 lb 10.7 oz (88.3 kg)   BMI 26.40 kg/m  Well-developed well-nourished patient in NAD. HEENT reveals PERLA, EOMI, discs not visualized.  Oral cavity is clear. No oral mucosal lesions are identified. Neck is clear without evidence of cervical or supraclavicular adenopathy. Lungs are clear to A&P. Cardiac examination is essentially unremarkable with regular rate and rhythm without murmur rub or thrill. Abdomen is benign with no organomegaly or masses noted. Motor sensory and DTR levels are equal and symmetric in the  upper and lower extremities. Cranial nerves II through XII are grossly intact. Proprioception is intact. No peripheral adenopathy or edema is identified. No motor or sensory levels are noted. Crude visual fields are within normal range.  RADIOLOGY RESULTS: no current films for review  PLAN: this time patient was symptomatically standpoint is doing fairly well. I have suggested cranberry juice for some of his urgency and frequency. Does not seem to merit medication at this time. I've asked to see him back in 4-5 months and will have a PSA drawn prior to his visit. Patient otherwise knows to call with any concerns at any time.  I would like to take this opportunity to thank you for allowing me to participate in the care of your patient.Noreene Filbert, MD

## 2019-01-01 ENCOUNTER — Inpatient Hospital Stay: Payer: Medicare HMO | Attending: Oncology

## 2019-01-08 ENCOUNTER — Ambulatory Visit: Payer: Medicare HMO | Attending: Radiation Oncology | Admitting: Radiation Oncology

## 2019-03-08 ENCOUNTER — Other Ambulatory Visit: Payer: Self-pay

## 2019-03-08 ENCOUNTER — Encounter: Payer: Self-pay | Admitting: Intensive Care

## 2019-03-08 ENCOUNTER — Emergency Department
Admission: EM | Admit: 2019-03-08 | Discharge: 2019-03-08 | Disposition: A | Discharge status: Home / Self Care | Payer: Medicare HMO | Attending: Emergency Medicine | Admitting: Emergency Medicine

## 2019-03-08 DIAGNOSIS — Z79899 Other long term (current) drug therapy: Secondary | ICD-10-CM | POA: Diagnosis not present

## 2019-03-08 DIAGNOSIS — F1410 Cocaine abuse, uncomplicated: Secondary | ICD-10-CM | POA: Insufficient documentation

## 2019-03-08 DIAGNOSIS — I10 Essential (primary) hypertension: Secondary | ICD-10-CM | POA: Diagnosis not present

## 2019-03-08 DIAGNOSIS — Z8546 Personal history of malignant neoplasm of prostate: Secondary | ICD-10-CM | POA: Diagnosis not present

## 2019-03-08 DIAGNOSIS — F17290 Nicotine dependence, other tobacco product, uncomplicated: Secondary | ICD-10-CM | POA: Insufficient documentation

## 2019-03-08 DIAGNOSIS — L539 Erythematous condition, unspecified: Secondary | ICD-10-CM | POA: Diagnosis not present

## 2019-03-08 DIAGNOSIS — R220 Localized swelling, mass and lump, head: Secondary | ICD-10-CM | POA: Diagnosis present

## 2019-03-08 DIAGNOSIS — T63481A Toxic effect of venom of other arthropod, accidental (unintentional), initial encounter: Secondary | ICD-10-CM | POA: Diagnosis not present

## 2019-03-08 MED ORDER — NAPROXEN 500 MG PO TABS
500.0000 mg | ORAL_TABLET | Freq: Once | ORAL | Status: AC
  Administered 2019-03-08: 500 mg via ORAL
  Filled 2019-03-08: qty 1

## 2019-03-08 MED ORDER — DEXAMETHASONE SODIUM PHOSPHATE 10 MG/ML IJ SOLN
10.0000 mg | Freq: Once | INTRAMUSCULAR | Status: AC
  Administered 2019-03-08: 10 mg via INTRAMUSCULAR
  Filled 2019-03-08: qty 1

## 2019-03-08 NOTE — ED Triage Notes (Signed)
Patient was working outside in yard yesterday and started having left sided facial swelling.

## 2019-03-08 NOTE — ED Notes (Signed)
See triage note  States he was in the yard last pm  Felt a sting/bite to left temporal area   Woke up with swelling to left side of face    Swelling noted mainly around eye with some redness

## 2019-03-08 NOTE — ED Provider Notes (Signed)
[facility] Emergency Department Provider Note  ____________________________________________  Time seen: Approximately 8:58 AM  I have reviewed the triage vital signs and the nursing notes.   HISTORY  Chief Complaint Facial Swelling   HPI Terry Mitchell is a 68 y.o. male presents to the emergency department for treatment and evaluation of swelling on the left side of his face.  He was in the yard last night and felt something sting his left forehead.  Has some swelling around the left eye with some mild erythema.   Past Medical History:  Diagnosis Date  . Depression    recently went on disability d/t diagnosis  . History of recent fall 01/2018   missed the last step on ladder, causing back discomfort  . Hypertension   . Prostate cancer ([place]) 12/2017   prostate    Patient Active Problem List   Diagnosis Date Noted  . Prostate cancer ([place]) 03/19/2018  . Androgen deprivation therapy 03/19/2018  . PSA elevation 03/19/2018  . Goals of care, counseling/discussion 03/19/2018  . Chest pain 02/20/2018    Past Surgical History:  Procedure Laterality Date  . BACK SURGERY  2001    2 rods and 6 screws in back  . Left shoulder surgery Left 1998   removed a piece of bone around collar bone  . PROSTATE BIOPSY N/A 02/28/2018   Procedure: PROSTATE BIOPSY;  Surgeon: Abbie Sons, MD;  Location: [facility] ORS;  Service: Urology;  Laterality: N/A;  . TRANSRECTAL ULTRASOUND N/A 02/28/2018   Procedure: TRANSRECTAL ULTRASOUND;  Surgeon: Abbie Sons, MD;  Location: [facility] ORS;  Service: Urology;  Laterality: N/A;    Prior to Admission medications   Medication Sig Start Date End Date Taking? Authorizing Provider  losartan (COZAAR) 50 MG tablet Take 50 mg by mouth daily after breakfast.  03/06/18   [provider]  vitamin B-12 (CYANOCOBALAMIN) 500 MCG tablet Take 500 mcg by mouth 3 (three) times a week.    [provider]    Allergies Penicillins  and Lactose intolerance (gi)  Family History  Problem Relation Age of Onset  . Stroke Mother   . Kidney failure Mother   . Diabetes Maternal Aunt     Social History Social History   Tobacco Use  . Smoking status: Current Every Day Smoker    Packs/day: 2.00    Types: Cigars  . Smokeless tobacco: Never Used  . Tobacco comment: 2 cigars  Substance Use Topics  . Alcohol use: Yes    Alcohol/week: 9.0 standard drinks    Types: 9 Cans of beer per week    Comment: occasional  . Drug use: Yes    Types: Cocaine, "Crack" cocaine    Comment: last use about 3 days ago     Review of Systems  Constitutional: Negative for fever. Respiratory: Negative for cough or shortness of breath.  Musculoskeletal: Negative for myalgias Skin: Positive for facial swelling. Neurological: Negative for numbness or paresthesias. ____________________________________________   PHYSICAL EXAM:  VITAL SIGNS: ED Triage Vitals  Enc Vitals Group     BP 03/08/19 0755 (!) 166/76     Pulse Rate 03/08/19 0755 63     Resp 03/08/19 0755 16     Temp 03/08/19 0755 98.4 F (36.9 C)     Temp Source 03/08/19 0755 Oral     SpO2 03/08/19 0755 97 %     Weight 03/08/19 0756 205 lb (93 kg)     Height 03/08/19 0756 6' (1.829 m)  Head Circumference --      Peak Flow --      Pain Score 03/08/19 0756 7     Pain Loc --      Pain Edu? --      Excl. in [place]? --      Constitutional: Well appearing. Eyes: Conjunctivae are clear without discharge or drainage. Nose: No rhinorrhea noted. Mouth/Throat: Airway is patent.  Neck: No stridor. Unrestricted range of motion observed. Cardiovascular: Capillary refill is <3 seconds.  Respiratory: Respirations are even and unlabored.. Musculoskeletal: Unrestricted range of motion observed. Neurologic: Awake, alert, and oriented x 4.  Skin: Localized edema in area of insect sting on left forehead, eyebrow, and eyelid.  ____________________________________________    LABS (all labs ordered are listed, but only abnormal results are displayed)  Labs Reviewed - No data to display ____________________________________________  EKG  Not indicated. ____________________________________________  RADIOLOGY  Not indicated ____________________________________________   PROCEDURES  Procedures ____________________________________________   INITIAL IMPRESSION / ASSESSMENT AND PLAN / ED COURSE  Terry Mitchell is a 68 y.o. male who presents to the emergency department for treatment and evaluation of facial swelling. See HPI for further details.  While here, the patient was given an injection of Decadron and some Naprosyn.  He was encouraged to follow-up with his primary care provider tomorrow if he is not improving.  He was given strict ER return precautions and will return for any onset of shortness of breath, lip swelling, airway changes, or wheezing.   Medications  dexamethasone (DECADRON) injection 10 mg (has no administration in time range)  naproxen (NAPROSYN) tablet 500 mg (has no administration in time range)     Pertinent labs & imaging results that were available during my care of the patient were reviewed by me and considered in my medical decision making (see chart for details).  ____________________________________________   FINAL CLINICAL IMPRESSION(S) / ED DIAGNOSES  Final diagnoses:  Allergic reaction to insect sting, accidental or unintentional, initial encounter    ED Discharge Orders    None       Note:  This document was prepared using Dragon voice recognition software and may include unintentional dictation errors.   Victorino Dike, FNP 03/08/19 [id number]    Carrie Mew, MD 03/08/19 [phone]

## 2019-03-08 NOTE — Discharge Instructions (Signed)
Return to the ER for symptoms of concern if unable to see primary care.

## 2019-04-13 ENCOUNTER — Other Ambulatory Visit: Payer: Self-pay | Admitting: Oncology

## 2019-04-13 DIAGNOSIS — C61 Malignant neoplasm of prostate: Secondary | ICD-10-CM

## 2019-04-13 NOTE — Progress Notes (Signed)
cc

## 2019-04-14 ENCOUNTER — Inpatient Hospital Stay: Payer: Medicare HMO | Attending: Oncology

## 2019-04-14 ENCOUNTER — Other Ambulatory Visit: Payer: Self-pay

## 2019-04-14 ENCOUNTER — Encounter: Payer: Self-pay | Admitting: Oncology

## 2019-04-14 DIAGNOSIS — R972 Elevated prostate specific antigen [PSA]: Secondary | ICD-10-CM | POA: Insufficient documentation

## 2019-04-14 DIAGNOSIS — Z880 Allergy status to penicillin: Secondary | ICD-10-CM | POA: Insufficient documentation

## 2019-04-14 DIAGNOSIS — F17210 Nicotine dependence, cigarettes, uncomplicated: Secondary | ICD-10-CM | POA: Insufficient documentation

## 2019-04-14 DIAGNOSIS — R351 Nocturia: Secondary | ICD-10-CM | POA: Insufficient documentation

## 2019-04-14 DIAGNOSIS — Z833 Family history of diabetes mellitus: Secondary | ICD-10-CM | POA: Diagnosis not present

## 2019-04-14 DIAGNOSIS — C61 Malignant neoplasm of prostate: Secondary | ICD-10-CM | POA: Insufficient documentation

## 2019-04-14 DIAGNOSIS — D539 Nutritional anemia, unspecified: Secondary | ICD-10-CM | POA: Diagnosis not present

## 2019-04-14 DIAGNOSIS — R232 Flushing: Secondary | ICD-10-CM | POA: Insufficient documentation

## 2019-04-14 DIAGNOSIS — Z79899 Other long term (current) drug therapy: Secondary | ICD-10-CM | POA: Insufficient documentation

## 2019-04-14 DIAGNOSIS — R5383 Other fatigue: Secondary | ICD-10-CM | POA: Diagnosis not present

## 2019-04-14 DIAGNOSIS — Z841 Family history of disorders of kidney and ureter: Secondary | ICD-10-CM | POA: Insufficient documentation

## 2019-04-14 DIAGNOSIS — Z823 Family history of stroke: Secondary | ICD-10-CM | POA: Diagnosis not present

## 2019-04-14 LAB — CBC WITH DIFFERENTIAL/PLATELET
Abs Immature Granulocytes: 0.02 10*3/uL (ref 0.00–0.07)
Basophils Absolute: 0 10*3/uL (ref 0.0–0.1)
Basophils Relative: 0 %
Eosinophils Absolute: 0.1 10*3/uL (ref 0.0–0.5)
Eosinophils Relative: 2 %
HCT: 37.3 % — ABNORMAL LOW (ref 39.0–52.0)
Hemoglobin: 12.2 g/dL — ABNORMAL LOW (ref 13.0–17.0)
Immature Granulocytes: 0 %
Lymphocytes Relative: 18 %
Lymphs Abs: 0.9 10*3/uL (ref 0.7–4.0)
MCH: 33.1 pg (ref 26.0–34.0)
MCHC: 32.7 g/dL (ref 30.0–36.0)
MCV: 101.1 fL — ABNORMAL HIGH (ref 80.0–100.0)
Monocytes Absolute: 0.6 10*3/uL (ref 0.1–1.0)
Monocytes Relative: 12 %
Neutro Abs: 3.5 10*3/uL (ref 1.7–7.7)
Neutrophils Relative %: 68 %
Platelets: 147 10*3/uL — ABNORMAL LOW (ref 150–400)
RBC: 3.69 MIL/uL — ABNORMAL LOW (ref 4.22–5.81)
RDW: 12.8 % (ref 11.5–15.5)
WBC: 5.1 10*3/uL (ref 4.0–10.5)
nRBC: 0 % (ref 0.0–0.2)

## 2019-04-14 LAB — COMPREHENSIVE METABOLIC PANEL
ALT: 14 U/L (ref 0–44)
AST: 28 U/L (ref 15–41)
Albumin: 3.9 g/dL (ref 3.5–5.0)
Alkaline Phosphatase: 118 U/L (ref 38–126)
Anion gap: 9 (ref 5–15)
BUN: 34 mg/dL — ABNORMAL HIGH (ref 8–23)
CO2: 25 mmol/L (ref 22–32)
Calcium: 8.8 mg/dL — ABNORMAL LOW (ref 8.9–10.3)
Chloride: 104 mmol/L (ref 98–111)
Creatinine, Ser: 1.47 mg/dL — ABNORMAL HIGH (ref 0.61–1.24)
GFR calc Af Amer: 57 mL/min — ABNORMAL LOW (ref >60)
GFR calc non Af Amer: 49 mL/min — ABNORMAL LOW (ref >60)
Glucose, Bld: 107 mg/dL — ABNORMAL HIGH (ref 70–99)
Potassium: 3.7 mmol/L (ref 3.5–5.1)
Sodium: 138 mmol/L (ref 135–145)
Total Bilirubin: 0.5 mg/dL (ref 0.3–1.2)
Total Protein: 7.1 g/dL (ref 6.5–8.1)

## 2019-04-14 LAB — PSA: Prostatic Specific Antigen: 24.18 ng/mL — ABNORMAL HIGH (ref 0.00–4.00)

## 2019-04-14 NOTE — Progress Notes (Signed)
Patient pre screened for office appointment, no questions or concerns today. 

## 2019-04-15 ENCOUNTER — Inpatient Hospital Stay: Payer: Medicare HMO

## 2019-04-15 ENCOUNTER — Inpatient Hospital Stay (HOSPITAL_BASED_OUTPATIENT_CLINIC_OR_DEPARTMENT_OTHER): Payer: Medicare HMO | Admitting: Oncology

## 2019-04-15 ENCOUNTER — Other Ambulatory Visit: Payer: Self-pay

## 2019-04-15 VITALS — BP 140/75 | HR 79 | Temp 98.1°F | Resp 18 | Wt 203.0 lb

## 2019-04-15 DIAGNOSIS — D539 Nutritional anemia, unspecified: Secondary | ICD-10-CM

## 2019-04-15 DIAGNOSIS — C61 Malignant neoplasm of prostate: Secondary | ICD-10-CM

## 2019-04-15 DIAGNOSIS — R972 Elevated prostate specific antigen [PSA]: Secondary | ICD-10-CM | POA: Diagnosis not present

## 2019-04-15 DIAGNOSIS — R232 Flushing: Secondary | ICD-10-CM | POA: Diagnosis not present

## 2019-04-15 LAB — TESTOSTERONE: Testosterone: 284 ng/dL (ref 264–916)

## 2019-04-15 MED ORDER — VENLAFAXINE HCL 37.5 MG PO TABS: 37.5000 mg | ORAL_TABLET | Freq: Every day | ORAL | 0 refills | Status: DC

## 2019-04-15 NOTE — Progress Notes (Signed)
Patient would like to discuss excessive sweating.

## 2019-04-15 NOTE — Progress Notes (Signed)
Hematology/Oncology Follow up note [facility] Telephone:(336) [phone] [phone]) [phone]   Patient Care Team: [name], MD as PCP - General (Family Medicine)  REFERRING PROVIDER: [facility] REASON FOR VISIT:  Reestablish care for prostate cancer.  HISTORY OF PRESENTING ILLNESS:  Terry Mitchell is a  68 y.o.  male with PMH listed below who was referred to me for evaluation of elevated PSA. Patient was referred by primary care physician to urology due to elevated PSA at the level of 72.  Per note Patient was referred to urology for further management.  Staging imaging has been ordered including abdominal and pelvis CT with contrast and bone scan.  Patient was also given Mills Koller for treatment of clinical prostate cancer.  Patient reports that he was scheduled to return to urologist office for biopsy. Patient denies any dysuria, hematuria, fever or chills.  He lives by himself. Denies any bone pain.  Reports some soreness at the area of Onset shots, as well as hot flushes.  Otherwise no complaints.  #01/01/2018 Patient received Firmagon loading dose at urologist office  # patient had a prostate biopsy on 02/28/2018 pathology showed prostate adenocarcinoma with androgen deprivation treatment effect.  Adenocarcinoma is present in all core fragments with involvement of 50% or more of each core.  Perineural invasion is present. As patient has received Firmagon prior to biopsy, Gleason grade cannot be reliably assessed after androgen deprivation therapy.  #  discussed with patient's urologist Dr. Bernardo Heater who did patient's prostate biopsy.  He agrees that given Mills Koller prior to biopsy and staging images are not typical.  He agrees that PET scan is consistent with lymph node involvement.  He does not think patient is a good candidate for radical prostatectomy.  He agrees with me about the plan that definitive radiation is a good option.  # September 2019   external Beam radiation. He was scheduled to have I-125 interstitial implant although that can not been done so patient underwent external beam IMRT prostate boost, finished in January 2020.  # patient was on androgen deprivation therapy with Firmagon injections, until August 2020. Declined ADT due to vasomotor symptoms.   INTERVAL HISTORY Terry Mitchell is a 68 y.o. male who has above history reviewed by me today presents for follow-up visit for management of prostate cancer -Stage IVA (cTX, cN1, cM0). Patient was last seen by me on 04/18/2018 and at that time he adamantly declined ADT due to vasomotor symptoms.  Reports severe hot flash and sweating.  Patient was previously given Effexor prescription for management of hot flash symptoms and he was not willing to take because he is concerned about side effects. Patient lost follow-up. Was last seen by radiation oncology Dr. Cheron Schaumann on 09/01/2018. PSA trend, 1/31/ 2020 PSA 0.83 Patient called to reestablish care. Blood work from 04/14/2019 showed testosterone level of 284, PSA has been decreased to 24.18.  Patient denies any bone pain, abdominal pain, or difficulty urination.  He has nocturia. Appetite is fair.  Weight has been stable. Reports that he continues to have severe hot flash and sweating, no exacerbating or alleviating factors.   Review of Systems  Constitutional: Positive for fatigue. Negative for appetite change, chills, fever and unexpected weight change.  HENT:   Negative for hearing loss and voice change.   Eyes: Negative for eye problems and icterus.  Respiratory: Negative for chest tightness, cough and shortness of breath.   Cardiovascular: Negative for chest pain and leg swelling.  Gastrointestinal:  Negative for abdominal distention and abdominal pain.  Endocrine: Positive for hot flashes.  Genitourinary: Negative for difficulty urinating, dysuria and frequency.   Musculoskeletal: Negative for arthralgias.  Skin:  Negative for itching and rash.  Neurological: Negative for light-headedness and numbness.  Hematological: Negative for adenopathy. Does not bruise/bleed easily.  Psychiatric/Behavioral: Negative for confusion.     MEDICAL HISTORY:  Past Medical History:  Diagnosis Date   Depression    recently went on disability d/t diagnosis   History of recent fall 01/2018   missed the last step on ladder, causing back discomfort   Hypertension    Prostate cancer ([place]) 12/2017   prostate    SURGICAL HISTORY: Past Surgical History:  Procedure Laterality Date   BACK SURGERY  2001    2 rods and 6 screws in back   Left shoulder surgery Left 1998   removed a piece of bone around collar bone   PROSTATE BIOPSY N/A 02/28/2018   Procedure: PROSTATE BIOPSY;  Surgeon: Abbie Sons, MD;  Location: [facility] ORS;  Service: Urology;  Laterality: N/A;   TRANSRECTAL ULTRASOUND N/A 02/28/2018   Procedure: TRANSRECTAL ULTRASOUND;  Surgeon: Abbie Sons, MD;  Location: [facility] ORS;  Service: Urology;  Laterality: N/A;    SOCIAL HISTORY: Social History   Socioeconomic History   Marital status: Single    Spouse name: Not on file   Number of children: 4   Years of education: Not on file   Highest education level: Not on file  Occupational History   Occupation: disability  Social Designer, fashion/clothing strain: Very hard   Food insecurity    Worry: Sometimes true    Inability: Often true   Transportation needs    Medical: No    Non-medical: Yes  Tobacco Use   Smoking status: Current Every Day Smoker    Packs/day: 2.00    Types: Cigars   Smokeless tobacco: Never Used   Tobacco comment: 2 cigars  Substance and Sexual Activity   Alcohol use: Yes    Alcohol/week: 9.0 standard drinks    Types: 9 Cans of beer per week    Comment: occasional   Drug use: Yes    Types: Cocaine, "Crack" cocaine    Comment: last use about 3 days ago    Sexual activity: Not Currently  Lifestyle     Physical activity    Days per week: 7 days    Minutes per session: 60 min   Stress: Rather much  Relationships   Social connections    Talks on phone: Not on file    Gets together: Not on file    Attends religious service: Not on file    Active member of club or organization: Not on file    Attends meetings of clubs or organizations: Not on file    Relationship status: Not on file   Intimate partner violence    Fear of current or ex partner: Not on file    Emotionally abused: Not on file    Physically abused: Not on file    Forced sexual activity: Not on file  Other Topics Concern   Not on file  Social History Narrative   Not on file    FAMILY HISTORY: Family History  Problem Relation Age of Onset   Stroke Mother    Kidney failure Mother    Diabetes Maternal Aunt     ALLERGIES:  is allergic to penicillins and lactose intolerance (gi).  MEDICATIONS:  Current Outpatient  Medications  Medication Sig Dispense Refill   losartan (COZAAR) 50 MG tablet Take 50 mg by mouth daily after breakfast.   2   vitamin B-12 (CYANOCOBALAMIN) 500 MCG tablet Take 500 mcg by mouth 3 (three) times a week.     venlafaxine (EFFEXOR) 37.5 MG tablet Take 1 tablet (37.5 mg total) by mouth daily. 30 tablet 0   No current facility-administered medications for this visit.      PHYSICAL EXAMINATION: ECOG PERFORMANCE STATUS: 1 - Symptomatic but completely ambulatory Vitals:   04/15/19 1406  BP: 140/75  Pulse: 79  Resp: 18  Temp: 98.1 F (36.7 C)   Filed Weights   04/15/19 1406  Weight: 203 lb (92.1 kg)    Physical Exam Constitutional:      General: He is not in acute distress. HENT:     Head: Normocephalic and atraumatic.  Eyes:     General: No scleral icterus.    Conjunctiva/sclera: Conjunctivae normal.     Pupils: Pupils are equal, round, and reactive to light.  Neck:     Musculoskeletal: Normal range of motion and neck supple.  Cardiovascular:     Rate and  Rhythm: Normal rate and regular rhythm.     Heart sounds: Normal heart sounds.  Pulmonary:     Effort: Pulmonary effort is normal. No respiratory distress.     Breath sounds: No wheezing.  Abdominal:     General: Bowel sounds are normal. There is no distension.     Palpations: Abdomen is soft. There is no mass.     Tenderness: There is no abdominal tenderness.  Musculoskeletal: Normal range of motion.        General: No deformity.  Lymphadenopathy:     Cervical: No cervical adenopathy.  Skin:    General: Skin is warm and dry.     Findings: No erythema or rash.  Neurological:     Mental Status: He is alert and oriented to person, place, and time.     Cranial Nerves: No cranial nerve deficit.     Coordination: Coordination normal.  Psychiatric:        Behavior: Behavior normal.        Thought Content: Thought content normal.     Comments: Anxious      LABORATORY DATA:  I have reviewed the data as listed Lab Results  Component Value Date   WBC 5.1 04/14/2019   HGB 12.2 (L) 04/14/2019   HCT 37.3 (L) 04/14/2019   MCV 101.1 (H) 04/14/2019   PLT 147 (L) 04/14/2019   Recent Labs    06/16/18 0823 08/22/18 1322 04/14/19 1049  NA 140 138 138  K 3.9 4.3 3.7  CL 105 104 104  CO2 25 25 25   GLUCOSE 96 106* 107*  BUN 32* 30* 34*  CREATININE 1.48* 1.51* 1.47*  CALCIUM 9.8 9.7 8.8*  GFRNONAA 48* 48* 49*  GFRAA 56* 55* 57*  PROT 6.8 7.3 7.1  ALBUMIN 4.1 4.1 3.9  AST 29 26 28   ALT 18 16 14   ALKPHOS 70 90 118  BILITOT 0.8 0.5 0.5   Iron/TIBC/Ferritin/ %Sat No results found for: IRON, TIBC, FERRITIN, IRONPCTSAT   RADIOGRAPHIC STUDIES: I have personally reviewed the radiological images as listed and agreed with the findings in the report. PET scan  Patient had an enlarged left external iliac node measuring 1.4 cm in short axis with maximum SUV 2.5.  Although activity level is relatively low, given the size of the lymph node suspicion is raised  for the possibility of  metastatic disease. There is also a 7 mm right external iliac lymph node and a 1.2 cm left inguinal lymph node demonstrate only low metabolic activity.  Not overtly enlarged.  [name], faint sclerosis in the right anterior sixth rib.  Neither has significant metabolic activity.  Significance uncertain. Benign-appearing rib fractures with associated low-grade activity along several right anterior ribs include the right ninth rib. Activity in the cecum without associated CT abnormality.  Most likely benign. No results found.   ASSESSMENT & PLAN:  1. Prostate cancer ([place])   2. PSA elevation   3. Hot flash in male   4. Macrocytic anemia   Cancer Staging Prostate cancer [facility]) Staging form: Prostate, AJCC 8th Edition - Clinical stage from 03/19/2018: Stage IVA (cTX, cN1, cM0) - Signed by [name], MD on 03/19/2018  # Prostate Cancer,  Patient has started on external beam radiation  PSA has decreased to 10.62 on 03/05/2018.   Nadired on 08/21/2018 at a level of 0.83. Patient declined androgen deprivation therapy. Recent blood work showed elevated PSA, suspecting prostate cancer recurrence.  Recommend to repeat staging including CT chest abdomen pelvis and bone scan. Hot flash, patient has not been on ADT for at least a year, however  continues to have hot flash. Check TSH. Discussed about trying Effexor 37.5 mg daily to see if that would control some of his vasomotor symptoms.  He agrees.  Prescription sent to pharmacy.  Macrocytic anemia, hemoglobin stable.  MCV 101.  Check folate and B12 in the next visit. Patient will follow-up after CT and bone scan for further discussion.  Orders Placed This Encounter  Procedures   CT CHEST WO CONTRAST    Standing Status:   Future    Standing Expiration Date:   04/14/2020    Order Specific Question:   ** REASON FOR EXAM (FREE TEXT)    Answer:   prostate cancer    Order Specific Question:    Preferred imaging location?    Answer:   [facility]    Order Specific Question:   Radiology Contrast Protocol - do NOT remove file path    Answer:   [email or web address]   CT Abdomen Pelvis Wo Contrast    Standing Status:   Future    Standing Expiration Date:   04/14/2020    Order Specific Question:   ** REASON FOR EXAM (FREE TEXT)    Answer:   prostate cancer    Order Specific Question:   Preferred imaging location?    Answer:   [facility]    Order Specific Question:   Is Oral Contrast requested for this exam?    Answer:   Yes, Per Radiology protocol    Order Specific Question:   Radiology Contrast Protocol - do NOT remove file path    Answer:   [email or web address]   NM Bone Scan Whole Body    Standing Status:   Future    Standing Expiration Date:   04/14/2020    Order Specific Question:   ** REASON FOR EXAM (FREE TEXT)    Answer:   prostate cancer    Order Specific Question:   If indicated for the ordered procedure, I authorize the administration of a radiopharmaceutical per Radiology protocol    Answer:   Yes    Order Specific Question:   Preferred imaging location?    Answer[facility]    [facility]    Order Specific Question:  Radiology Contrast Protocol - do NOT remove file path    Answer:   [email or web address]   TSH    Standing Status:   Future    Standing Expiration Date:   04/14/2020   Folate    Standing Status:   Future    Standing Expiration Date:   04/14/2020   Vitamin B12    Standing Status:   Future    Standing Expiration Date:   04/14/2020    We spent sufficient time to discuss many aspect of care, questions were answered to patient's satisfaction.   [name], MD, PhD Hematology Oncology [facility] Pager- [phone] 04/15/2019

## 2019-04-28 ENCOUNTER — Ambulatory Visit
Admission: RE | Admit: 2019-04-28 | Discharge: 2019-04-28 | Disposition: A | Discharge status: Home / Self Care | Payer: Medicare HMO | Source: Ambulatory Visit | Attending: Oncology | Admitting: Oncology

## 2019-04-28 ENCOUNTER — Other Ambulatory Visit: Payer: Self-pay

## 2019-04-28 DIAGNOSIS — C61 Malignant neoplasm of prostate: Secondary | ICD-10-CM | POA: Insufficient documentation

## 2019-04-28 IMAGING — CT CT ABD-PELV W/O CM
2 of 4 series · 12 of 36 positions shown, 15 images · non-contrast
Comparison: PET-CT from [DATE]

CLINICAL DATA: Prostate cancer restaging.

EXAM:
CT CHEST, ABDOMEN AND PELVIS WITHOUT CONTRAST
TECHNIQUE: Multidetector CT imaging of the chest, abdomen and pelvis was
performed following the standard protocol without IV contrast.

[Series 2: axial (person_name) (person_name) · axial · 0.73mm/px · z∈[-638,-88]mm · 9 of 132 slices shown, 12 images]
[im 11/132  mediastinal]
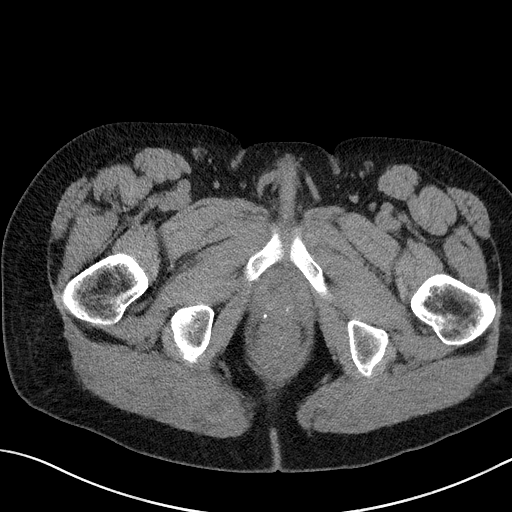
[im 11/132  lung]
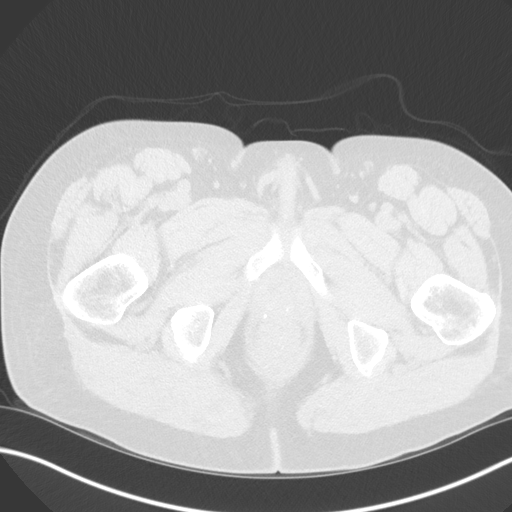
[im 22/132  lung]
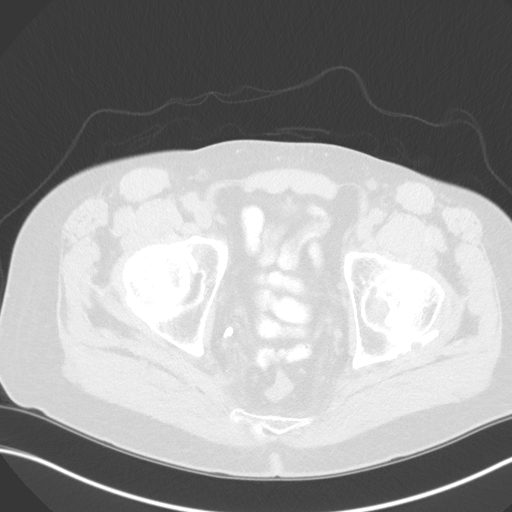
[im 44/132  lung]
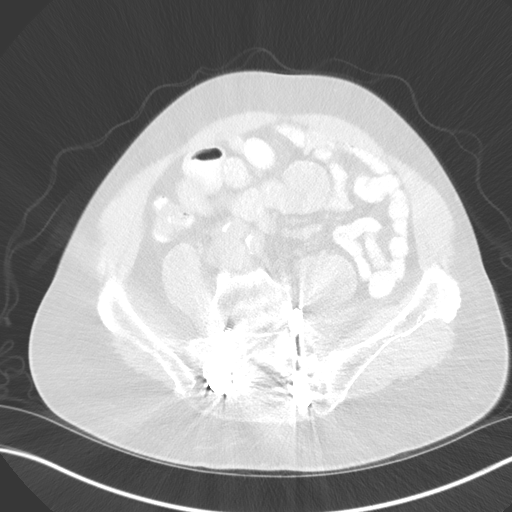
[im 55/132  lung]
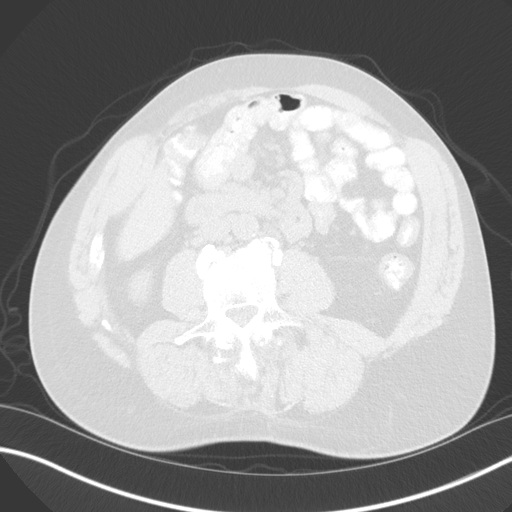
[im 66/132  mediastinal]
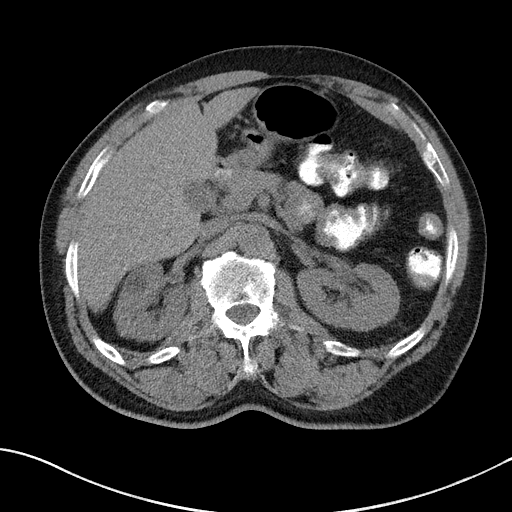
[im 66/132  lung]
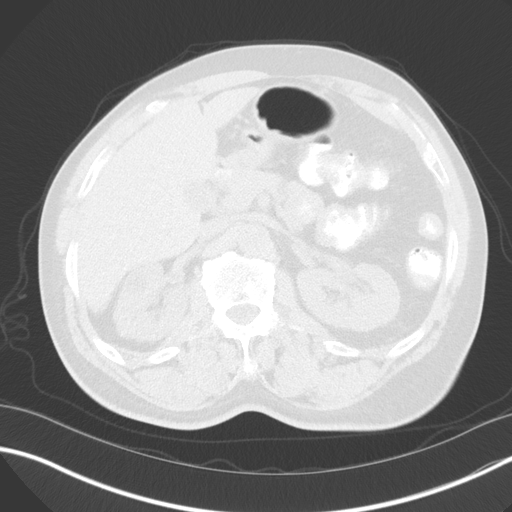
[im 77/132  lung]
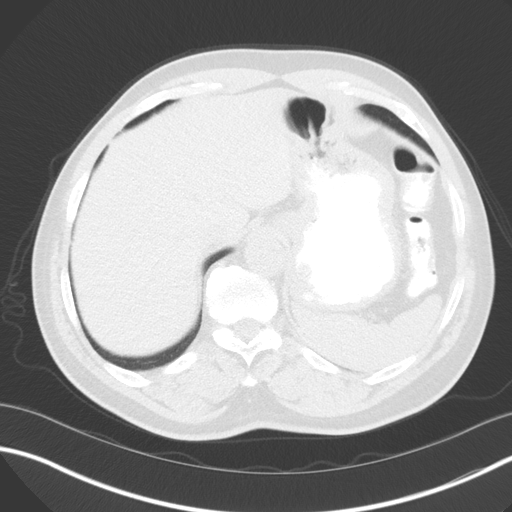
[im 88/132  lung]
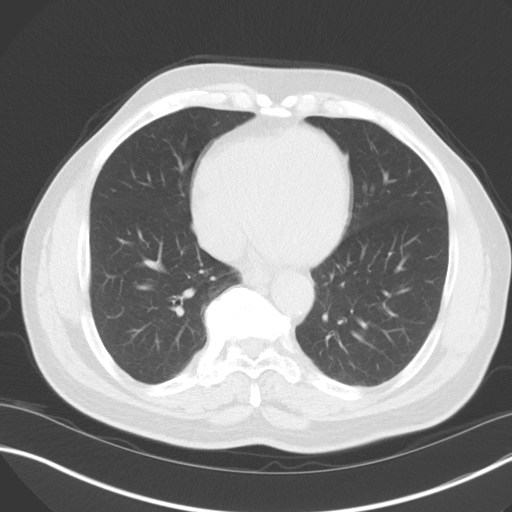
[im 110/132  lung]
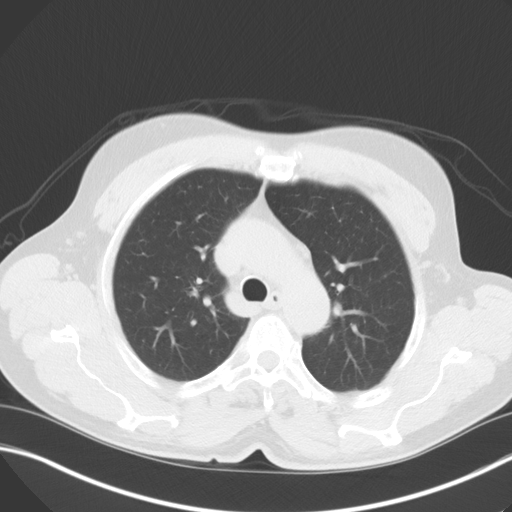
[im 121/132  mediastinal]
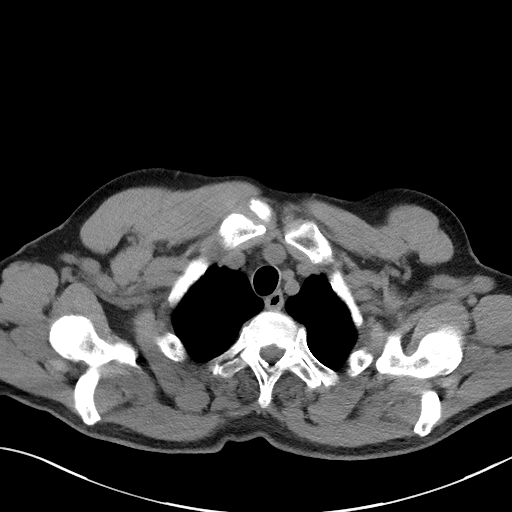
[im 121/132  lung]
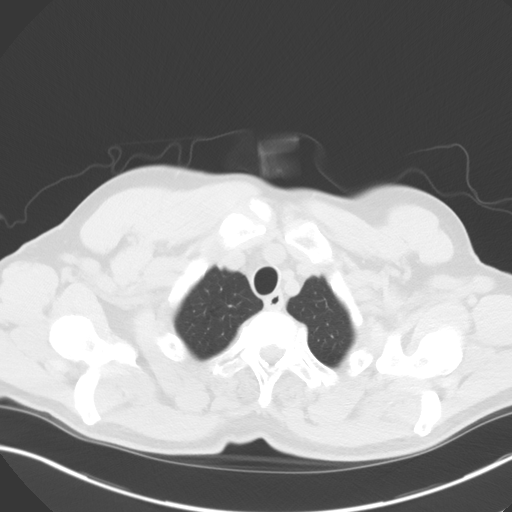

[Series 5: coronal · coronal · 0.70mm/px · 3 of 161 slices shown]
[im 33/161  lung]
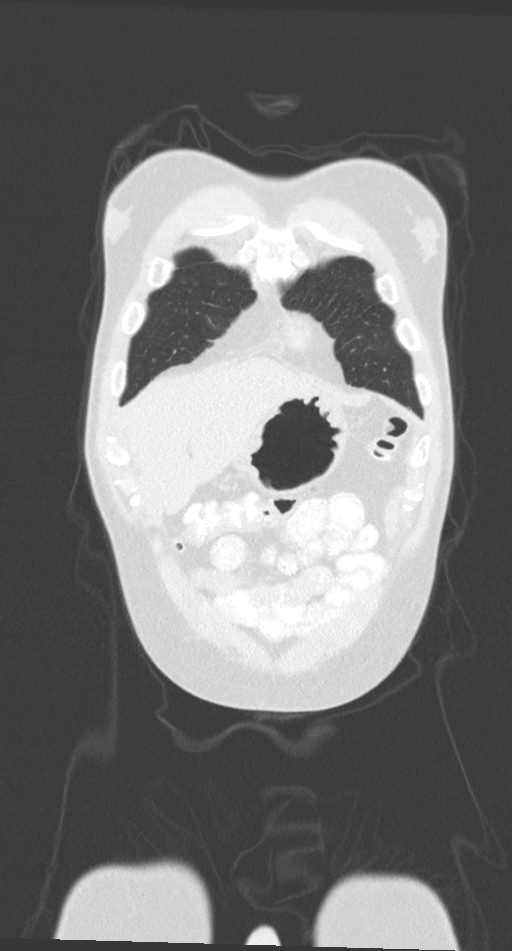
[im 65/161  lung]
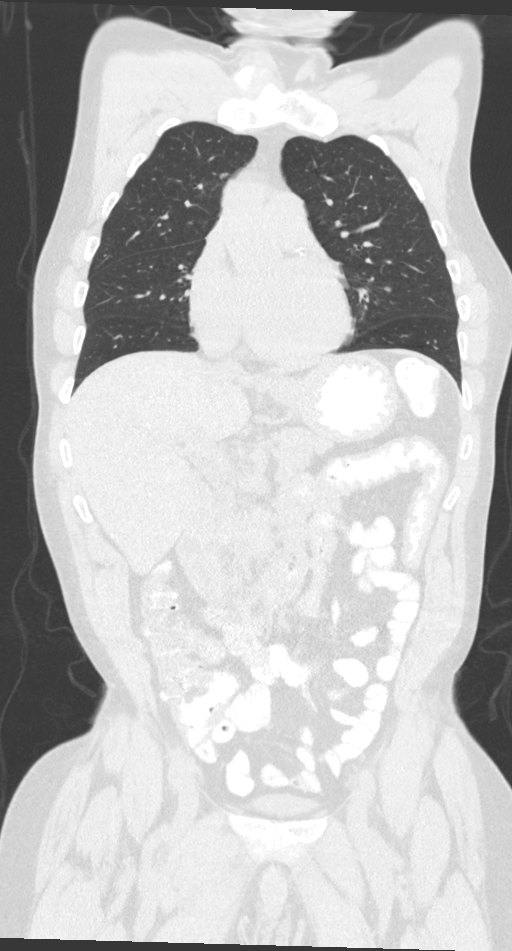
[im 97/161  lung]
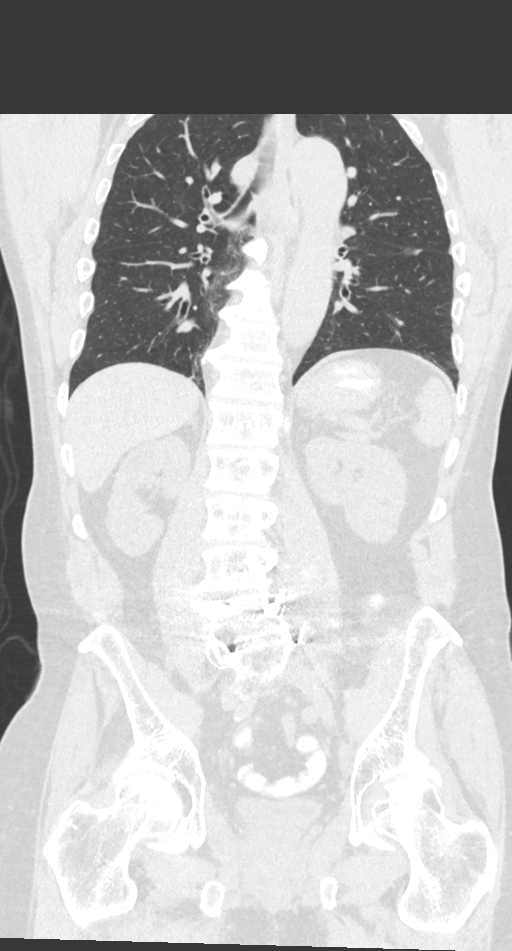

[12 of 36 positions shown; findings below may reference images not displayed]

FINDINGS: CT CHEST FINDINGS

Cardiovascular: Descending thoracic aortic and left anterior
descending coronary artery atherosclerotic calcification.

Mediastinum/Nodes: Unremarkable

Lungs/Pleura: Stable 3 by 4 mm lingular subpleural nodule on image
97/4.

Airway thickening is present, suggesting bronchitis or reactive
airways disease.

Musculoskeletal: Degenerated right AC joint. Wide left AC joint,
query prior distal clavicular resection on the right. Degenerative
right sternoclavicular arthropathy.

Subtle sclerosis anteriorly in the right third rib on image 58/4,
not readily appreciable on the prior exam subtle sclerosis
posteriorly in the right second rib on image 39/4, not well
appreciated on the prior exam. Mild sclerosis inferiorly in the left
lateral third rib. Small amount of lateral cortical thickening of
the left lateral sixth rib on image 106/4.

There is sclerosis in the left T2 pedicle, image 110/6, not
previously hypermetabolic. Sclerosis along the posteroinferior T6
vertebra measuring 1.3 by 1.1 cm on image 95/6, previously there was
only about a 3 mm small focus of sclerosis at this location.
Additional small foci of sclerosis in several ribs. Increased size
of a focus of sclerosis in the T12 vertebral body, 1.3 by 0.9 cm on
image 81/6.

CT ABDOMEN PELVIS FINDINGS

Hepatobiliary: Unremarkable

Pancreas: Unremarkable

Spleen: Unremarkable

Adrenals/Urinary Tract: Both adrenal glands appear normal. 1.8 cm in
diameter fluid density lesion of the right kidney upper pole most
compatible with a cyst. The kidneys appear otherwise unremarkable.
Bladder unremarkable.

Stomach/Bowel: Possible mild wall thickening in the rectum.
Otherwise unremarkable.

Vascular/Lymphatic: Aortoiliac atherosclerotic vascular disease.
Left inguinal lymph node 1.2 cm in short axis, previously the same.
Left external iliac node 0.9 cm in short axis, formerly 1.4 cm.

Reproductive: Punctate calcifications in the prostate gland.

Other: No supplemental non-categorized findings.

Musculoskeletal: Postoperative findings in the lower lumbar spine.
Graft harvesting site from the right iliac bone. Fused what right
sacroiliac joint with at least partial bridging of the left
sacroiliac joint. Large synovial herniation pit of the right hip.
Inferior endplate sclerosis at L3 surrounding a Schmorl's node,
image 74/5.
IMPRESSION: 1. Enlarging sclerotic focus along the posteroinferior T6 vertebral
body, and enlarging sclerotic focus in the T12 vertebral body. Small
bilateral rib sclerotic foci and a sclerotic lesion in the left T2
vertebral pedicle. Some of these were present and not significantly
hypermetabolic on the prior PET-CT, but overall the appearance does
raise concern for osseous metastatic disease.
2. Previous left pelvic adenopathy has significantly improved, with
the left external iliac node at 0.9 cm in short axis, formerly
cm.
3. Other imaging findings of potential clinical significance: Aortic
Atherosclerosis ([NX]-[NX]). Coronary atherosclerosis. Airway
thickening is present, suggesting bronchitis or reactive airways
disease. Possible wall thickening in the rectum, nonspecific.

## 2019-04-28 IMAGING — NM NM BONE WHOLE BODY
2 series · 10 of 10 positions shown · non-contrast
Comparison: CT [DATE]

CLINICAL DATA: Prostate cancer.

EXAM:
NUCLEAR MEDICINE WHOLE BODY BONE SCAN
TECHNIQUE: Whole body anterior and posterior images were obtained approximately
3 hours after intravenous injection of radiopharmaceutical.
RADIOPHARMACEUTICALS:  22.5 mCi [90] MDP IV

[Series 1000: statics · 2.40mm/px · 4 acquisitions, 8 frames shown]
[im 1/4]
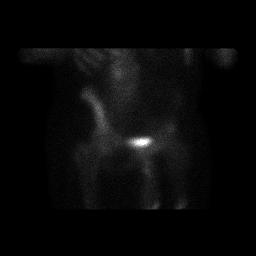
[im 1/4]
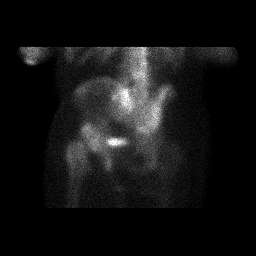
[im 2/4]
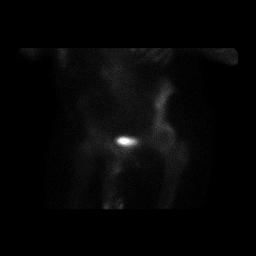
[im 2/4]
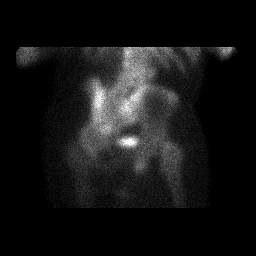
[im 3/4]
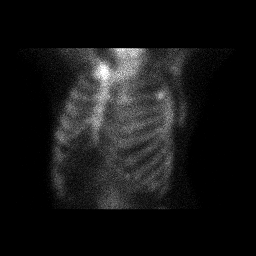
[im 3/4]
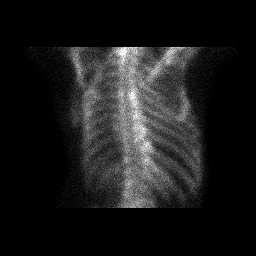
[im 4/4]
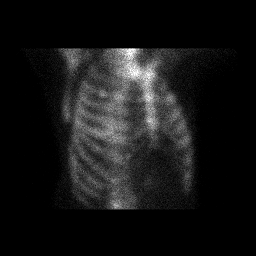
[im 4/4]
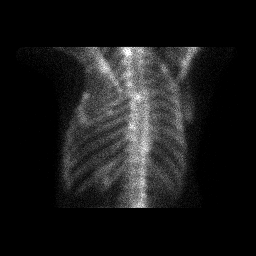

[Series 1000: 3 hr wholebody · 2.40mm/px · 2 of 2 frames shown]
[frame 1/2]
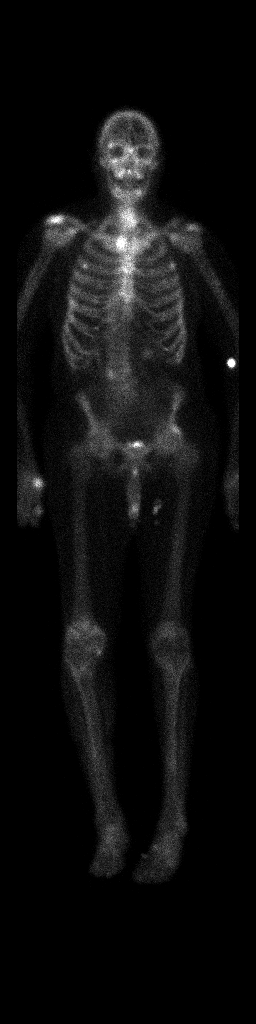
[frame 2/2]
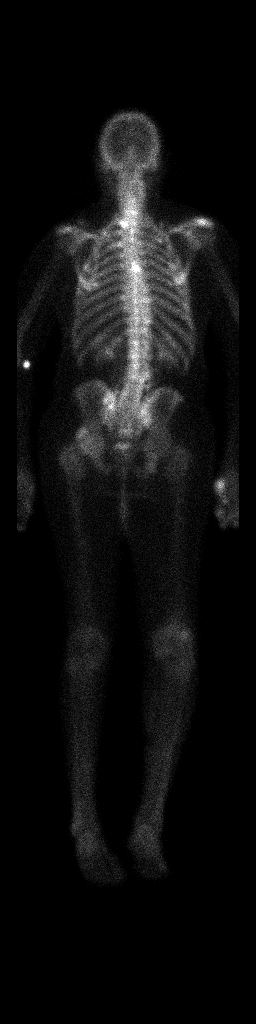

[10 of 10 positions shown; findings below may reference images not displayed]

FINDINGS: Subtle foci of radiotracer activity within the anterolateral aspect
of the LEFT and RIGHT third rib corresponds to sclerotic lesion on
comparison CT and consistent with skeletal metastasis. Focal uptake
in the T6 vertebral body also corresponds to sclerotic lesion on
comparison CT. Focal uptake in the T1 region also corresponds to
sclerotic lesion the pedicle on comparison CT.

Focal uptake in the medial RIGHT clavicle may be degenerative.
Probable degenerative uptake in LEFT acetabulum.
IMPRESSION: 1. Focal uptake within the third ribs, T6 and T1 vertebral body
correspond to sclerotic lesions on comparison CT and consistent with
skeletal metastasis.
2. Probable degenerative uptake in the RIGHT clavicle and LEFT
acetabulum.

## 2019-04-28 IMAGING — CT CT CHEST W/O CM
2 of 4 series · 12 of 36 positions shown, 15 images · non-contrast
Comparison: PET-CT from [DATE]

CLINICAL DATA: Prostate cancer restaging.

EXAM:
CT CHEST, ABDOMEN AND PELVIS WITHOUT CONTRAST
TECHNIQUE: Multidetector CT imaging of the chest, abdomen and pelvis was
performed following the standard protocol without IV contrast.

[Series 2: axial (person_name) (person_name) · axial · 0.73mm/px · z∈[-638,-88]mm · 9 of 132 slices shown, 12 images]
[im 11/132  mediastinal]
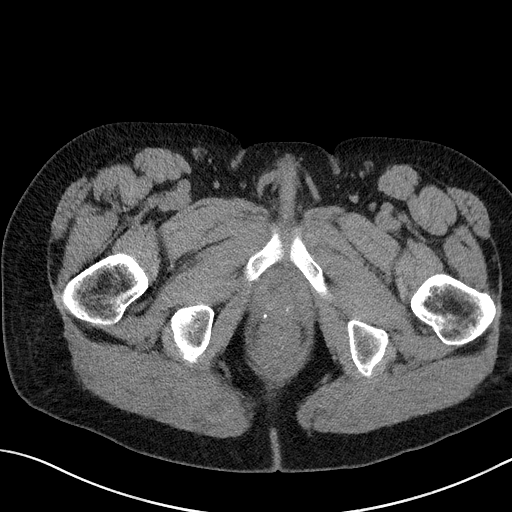
[im 11/132  lung]
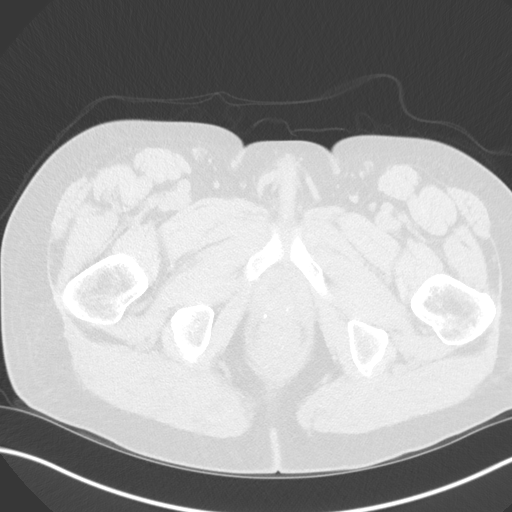
[im 22/132  lung]
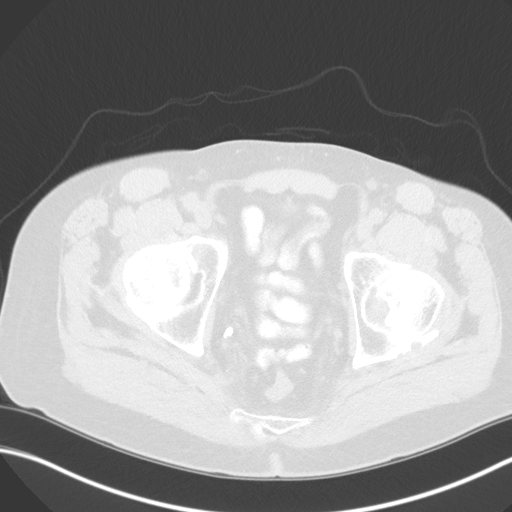
[im 44/132  lung]
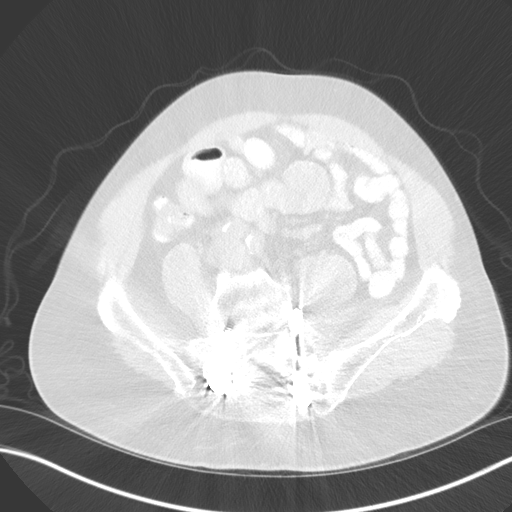
[im 55/132  lung]
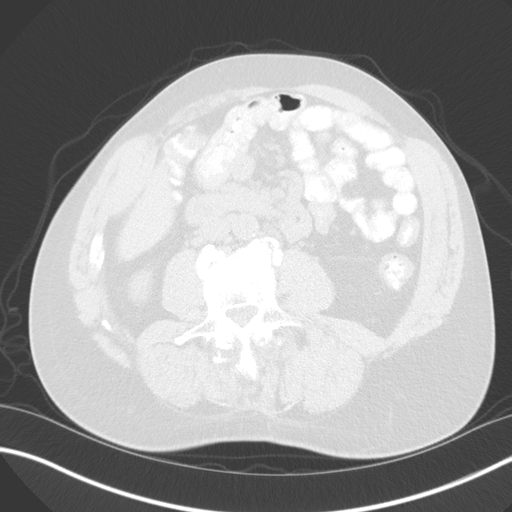
[im 66/132  mediastinal]
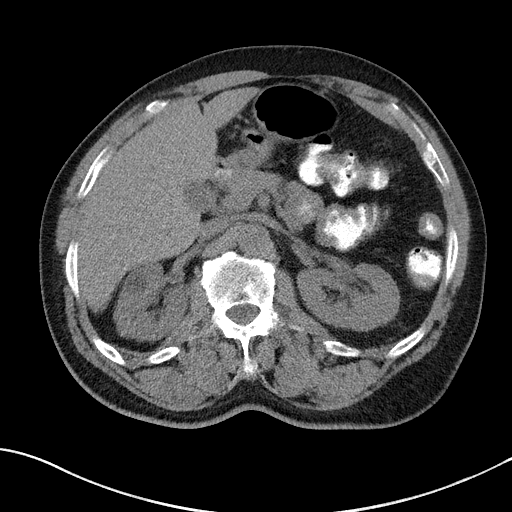
[im 66/132  lung]
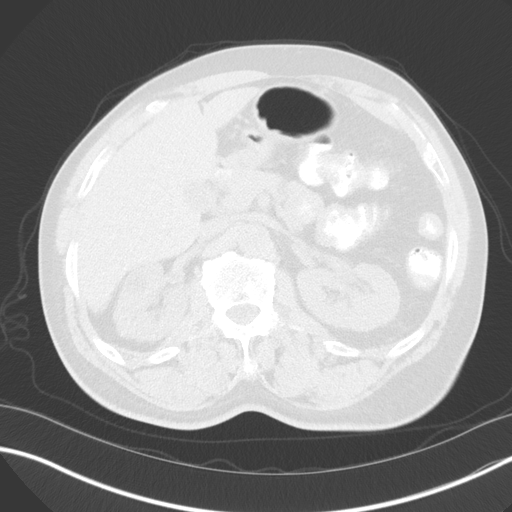
[im 77/132  lung]
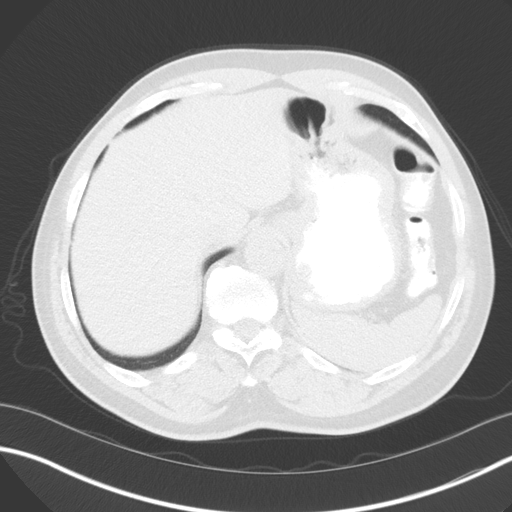
[im 88/132  lung]
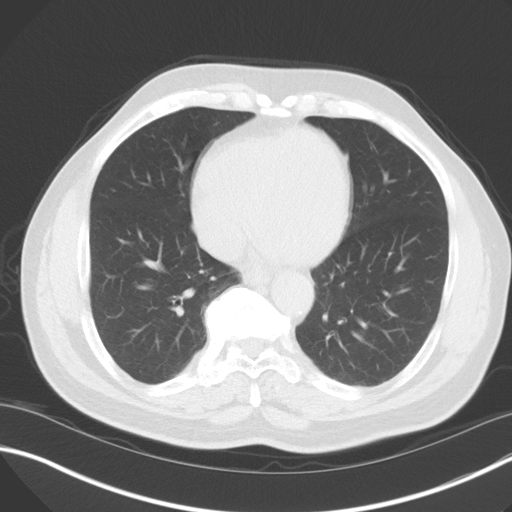
[im 110/132  lung]
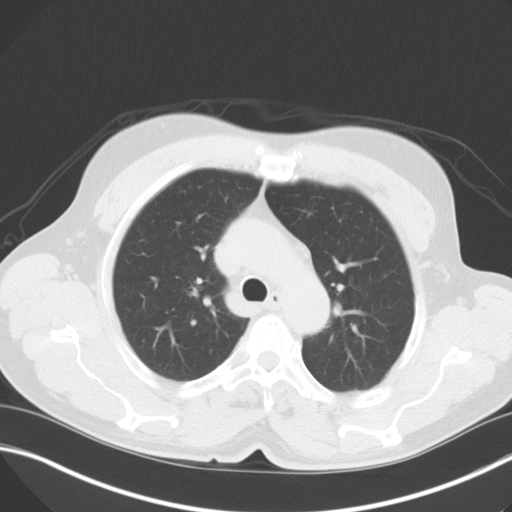
[im 121/132  mediastinal]
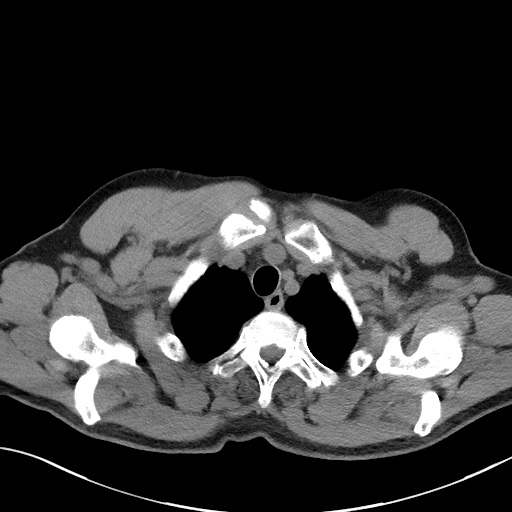
[im 121/132  lung]
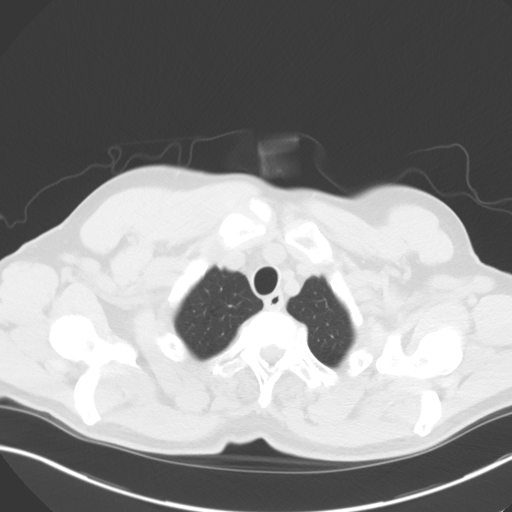

[Series 5: coronal · coronal · 0.70mm/px · 3 of 161 slices shown]
[im 33/161  lung]
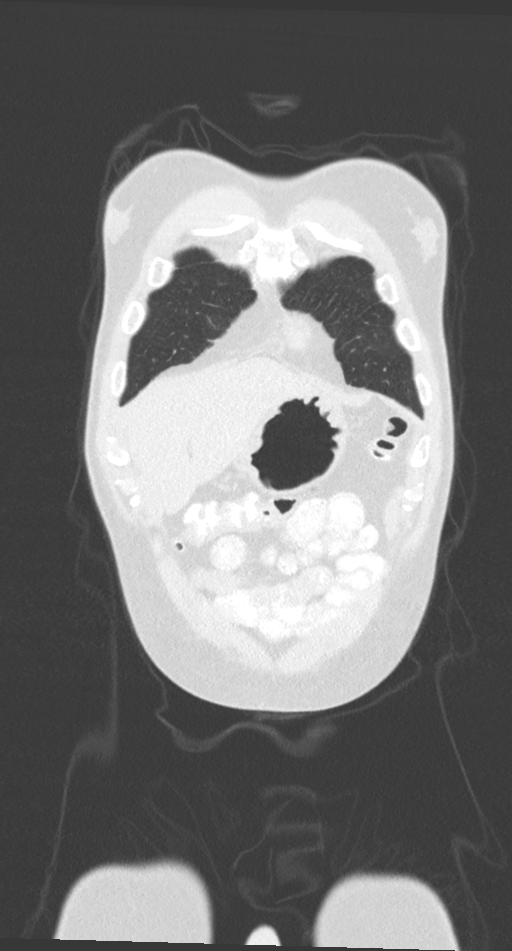
[im 65/161  lung]
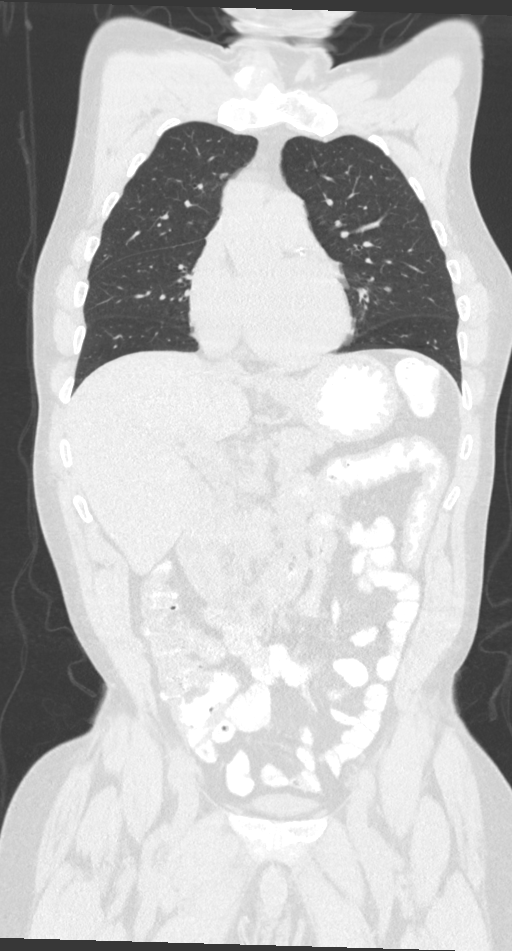
[im 97/161  lung]
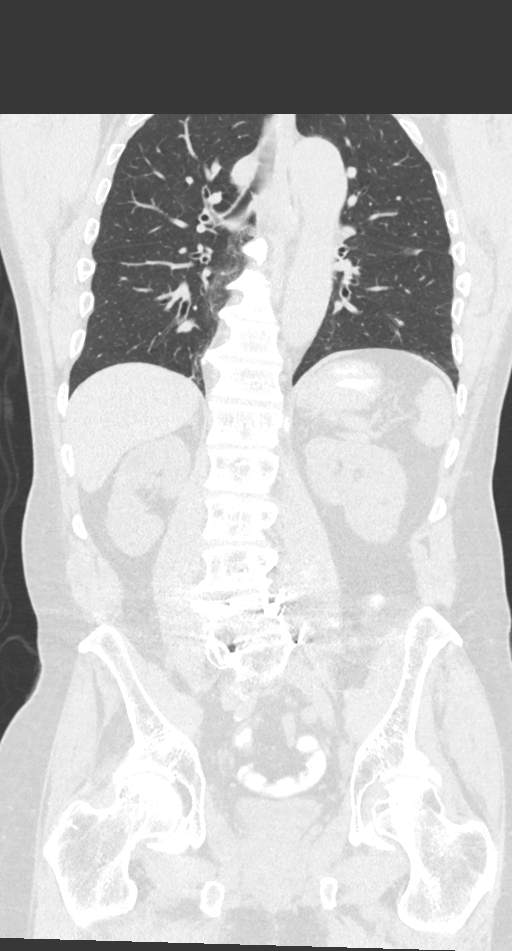

[12 of 36 positions shown; findings below may reference images not displayed]

FINDINGS: CT CHEST FINDINGS

Cardiovascular: Descending thoracic aortic and left anterior
descending coronary artery atherosclerotic calcification.

Mediastinum/Nodes: Unremarkable

Lungs/Pleura: Stable 3 by 4 mm lingular subpleural nodule on image
97/4.

Airway thickening is present, suggesting bronchitis or reactive
airways disease.

Musculoskeletal: Degenerated right AC joint. Wide left AC joint,
query prior distal clavicular resection on the right. Degenerative
right sternoclavicular arthropathy.

Subtle sclerosis anteriorly in the right third rib on image 58/4,
not readily appreciable on the prior exam subtle sclerosis
posteriorly in the right second rib on image 39/4, not well
appreciated on the prior exam. Mild sclerosis inferiorly in the left
lateral third rib. Small amount of lateral cortical thickening of
the left lateral sixth rib on image 106/4.

There is sclerosis in the left T2 pedicle, image 110/6, not
previously hypermetabolic. Sclerosis along the posteroinferior T6
vertebra measuring 1.3 by 1.1 cm on image 95/6, previously there was
only about a 3 mm small focus of sclerosis at this location.
Additional small foci of sclerosis in several ribs. Increased size
of a focus of sclerosis in the T12 vertebral body, 1.3 by 0.9 cm on
image 81/6.

CT ABDOMEN PELVIS FINDINGS

Hepatobiliary: Unremarkable

Pancreas: Unremarkable

Spleen: Unremarkable

Adrenals/Urinary Tract: Both adrenal glands appear normal. 1.8 cm in
diameter fluid density lesion of the right kidney upper pole most
compatible with a cyst. The kidneys appear otherwise unremarkable.
Bladder unremarkable.

Stomach/Bowel: Possible mild wall thickening in the rectum.
Otherwise unremarkable.

Vascular/Lymphatic: Aortoiliac atherosclerotic vascular disease.
Left inguinal lymph node 1.2 cm in short axis, previously the same.
Left external iliac node 0.9 cm in short axis, formerly 1.4 cm.

Reproductive: Punctate calcifications in the prostate gland.

Other: No supplemental non-categorized findings.

Musculoskeletal: Postoperative findings in the lower lumbar spine.
Graft harvesting site from the right iliac bone. Fused what right
sacroiliac joint with at least partial bridging of the left
sacroiliac joint. Large synovial herniation pit of the right hip.
Inferior endplate sclerosis at L3 surrounding a Schmorl's node,
image 74/5.
IMPRESSION: 1. Enlarging sclerotic focus along the posteroinferior T6 vertebral
body, and enlarging sclerotic focus in the T12 vertebral body. Small
bilateral rib sclerotic foci and a sclerotic lesion in the left T2
vertebral pedicle. Some of these were present and not significantly
hypermetabolic on the prior PET-CT, but overall the appearance does
raise concern for osseous metastatic disease.
2. Previous left pelvic adenopathy has significantly improved, with
the left external iliac node at 0.9 cm in short axis, formerly
cm.
3. Other imaging findings of potential clinical significance: Aortic
Atherosclerosis ([NX]-[NX]). Coronary atherosclerosis. Airway
thickening is present, suggesting bronchitis or reactive airways
disease. Possible wall thickening in the rectum, nonspecific.

## 2019-04-28 MED ORDER — TECHNETIUM TC 99M MEDRONATE IV KIT
22.4650 | PACK | Freq: Once | INTRAVENOUS | Status: AC | PRN
  Administered 2019-04-28: 22.465 via INTRAVENOUS

## 2019-04-30 ENCOUNTER — Other Ambulatory Visit: Payer: Self-pay

## 2019-04-30 ENCOUNTER — Encounter: Payer: Self-pay | Admitting: Oncology

## 2019-04-30 NOTE — Progress Notes (Signed)
Patient pre-screened for appointment. States he has been having diarrhea for the past week.

## 2019-05-01 ENCOUNTER — Other Ambulatory Visit: Payer: Self-pay

## 2019-05-01 ENCOUNTER — Inpatient Hospital Stay (HOSPITAL_BASED_OUTPATIENT_CLINIC_OR_DEPARTMENT_OTHER): Payer: Medicare HMO | Admitting: Oncology

## 2019-05-01 ENCOUNTER — Inpatient Hospital Stay: Payer: Medicare HMO | Attending: Oncology

## 2019-05-01 VITALS — BP 180/89 | HR 58 | Temp 96.7°F | Resp 18 | Wt 207.0 lb

## 2019-05-01 DIAGNOSIS — R5383 Other fatigue: Secondary | ICD-10-CM | POA: Insufficient documentation

## 2019-05-01 DIAGNOSIS — C61 Malignant neoplasm of prostate: Secondary | ICD-10-CM

## 2019-05-01 DIAGNOSIS — Z7189 Other specified counseling: Secondary | ICD-10-CM | POA: Diagnosis not present

## 2019-05-01 DIAGNOSIS — N1832 Chronic kidney disease, stage 3b: Secondary | ICD-10-CM | POA: Insufficient documentation

## 2019-05-01 DIAGNOSIS — E538 Deficiency of other specified B group vitamins: Secondary | ICD-10-CM

## 2019-05-01 DIAGNOSIS — R232 Flushing: Secondary | ICD-10-CM | POA: Diagnosis not present

## 2019-05-01 DIAGNOSIS — R911 Solitary pulmonary nodule: Secondary | ICD-10-CM | POA: Diagnosis not present

## 2019-05-01 DIAGNOSIS — I2510 Atherosclerotic heart disease of native coronary artery without angina pectoris: Secondary | ICD-10-CM | POA: Diagnosis not present

## 2019-05-01 DIAGNOSIS — I700 Atherosclerosis of aorta: Secondary | ICD-10-CM | POA: Diagnosis not present

## 2019-05-01 DIAGNOSIS — Z833 Family history of diabetes mellitus: Secondary | ICD-10-CM | POA: Diagnosis not present

## 2019-05-01 DIAGNOSIS — D539 Nutritional anemia, unspecified: Secondary | ICD-10-CM | POA: Diagnosis not present

## 2019-05-01 DIAGNOSIS — Z823 Family history of stroke: Secondary | ICD-10-CM | POA: Diagnosis not present

## 2019-05-01 DIAGNOSIS — Z5111 Encounter for antineoplastic chemotherapy: Secondary | ICD-10-CM | POA: Insufficient documentation

## 2019-05-01 DIAGNOSIS — Z880 Allergy status to penicillin: Secondary | ICD-10-CM | POA: Insufficient documentation

## 2019-05-01 DIAGNOSIS — Z79899 Other long term (current) drug therapy: Secondary | ICD-10-CM | POA: Insufficient documentation

## 2019-05-01 DIAGNOSIS — R079 Chest pain, unspecified: Secondary | ICD-10-CM | POA: Diagnosis not present

## 2019-05-01 DIAGNOSIS — I129 Hypertensive chronic kidney disease with stage 1 through stage 4 chronic kidney disease, or unspecified chronic kidney disease: Secondary | ICD-10-CM | POA: Diagnosis not present

## 2019-05-01 DIAGNOSIS — F17210 Nicotine dependence, cigarettes, uncomplicated: Secondary | ICD-10-CM | POA: Diagnosis not present

## 2019-05-01 DIAGNOSIS — Z923 Personal history of irradiation: Secondary | ICD-10-CM | POA: Diagnosis not present

## 2019-05-01 DIAGNOSIS — C7951 Secondary malignant neoplasm of bone: Secondary | ICD-10-CM | POA: Insufficient documentation

## 2019-05-01 DIAGNOSIS — F17290 Nicotine dependence, other tobacco product, uncomplicated: Secondary | ICD-10-CM | POA: Diagnosis not present

## 2019-05-01 DIAGNOSIS — D696 Thrombocytopenia, unspecified: Secondary | ICD-10-CM | POA: Diagnosis not present

## 2019-05-01 DIAGNOSIS — Z841 Family history of disorders of kidney and ureter: Secondary | ICD-10-CM | POA: Diagnosis not present

## 2019-05-01 DIAGNOSIS — Z5189 Encounter for other specified aftercare: Secondary | ICD-10-CM | POA: Insufficient documentation

## 2019-05-01 DIAGNOSIS — Z7289 Other problems related to lifestyle: Secondary | ICD-10-CM | POA: Diagnosis not present

## 2019-05-01 LAB — FOLATE: Folate: 20.3 ng/mL (ref >5.9)

## 2019-05-01 LAB — TSH: TSH: 1.643 u[IU]/mL (ref 0.350–4.500)

## 2019-05-01 LAB — VITAMIN B12: Vitamin B-12: 167 pg/mL — ABNORMAL LOW (ref 180–914)

## 2019-05-01 MED ORDER — DEXAMETHASONE 4 MG PO TABS: 8.0000 mg | ORAL_TABLET | Freq: Two times a day (BID) | ORAL | 1 refills | Status: DC

## 2019-05-01 MED ORDER — ONDANSETRON HCL 8 MG PO TABS: 8.0000 mg | ORAL_TABLET | Freq: Two times a day (BID) | ORAL | 1 refills | Status: DC | PRN

## 2019-05-01 MED ORDER — PROCHLORPERAZINE MALEATE 10 MG PO TABS: 10.0000 mg | ORAL_TABLET | Freq: Four times a day (QID) | ORAL | 1 refills | Status: DC | PRN

## 2019-05-01 NOTE — Progress Notes (Signed)
Hematology/Oncology Follow up note [facility] [phone] [phone]   Patient Care Team: [name], MD as PCP - General (Family Medicine)  REFERRING PROVIDER: [facility] REASON FOR VISIT:  Reestablish care for prostate cancer.  HISTORY OF PRESENTING ILLNESS:  Terry Mitchell is a  68 y.o.  male with PMH listed below who was referred to me for evaluation of elevated PSA. Patient was referred by primary care physician to urology due to elevated PSA at the level of 72.  Per note Patient was referred to urology for further management.  Staging imaging has been ordered including abdominal and pelvis CT with contrast and bone scan.  Patient was also given Mills Koller for treatment of clinical prostate cancer.  Patient reports that he was scheduled to return to urologist office for biopsy. Patient denies any dysuria, hematuria, fever or chills.  He lives by himself. Denies any bone pain.  Reports some soreness at the area of [place] shots, as well as hot flushes.  Otherwise no complaints.  #01/01/2018 Patient received Firmagon loading dose at urologist office  # patient had a prostate biopsy on 02/28/2018 pathology showed prostate adenocarcinoma with androgen deprivation treatment effect.  Adenocarcinoma is present in all core fragments with involvement of 50% or more of each core.  Perineural invasion is present. As patient has received Firmagon prior to biopsy, Gleason grade cannot be reliably assessed after androgen deprivation therapy.  #  discussed with patient's urologist Dr. Bernardo Heater who did patient's prostate biopsy.  He agrees that given Mills Koller prior to biopsy and staging images are not typical.  He agrees that PET scan is consistent with lymph node involvement.  He does not think patient is a good candidate for radical prostatectomy.  He agrees with me about the plan that definitive radiation is a good option.  # September 2019   external Beam radiation. He was scheduled to have I-125 interstitial implant although that can not been done so patient underwent external beam IMRT prostate boost, finished in January 2020.  # patient was on androgen deprivation therapy with Firmagon injections, until August 2020. Declined ADT due to vasomotor symptoms.   #Blood work from 04/14/2019 showed testosterone level of 284, PSA has been decreased to 24.18.  INTERVAL HISTORY Terry Mitchell is a 68 y.o. male who has above history reviewed by me today presents for follow-up visit for management of prostate cancer -Stage IVA (cTX, cN1, cM1).  Interval, patient has had CT chest abdomen pelvis as well as bone scan.  Present to discuss results and treatment plan. He also has been started on Effexor 37.5 mg daily for hot flashes.  He has not felt any difference yet. Patient has no new complaints today.  Denies any bone pain. Appetite is fair.  Weight has been stable.     Review of Systems  Constitutional: Positive for fatigue. Negative for appetite change, chills, fever and unexpected weight change.  HENT:   Negative for hearing loss and voice change.   Eyes: Negative for eye problems and icterus.  Respiratory: Negative for chest tightness, cough and shortness of breath.   Cardiovascular: Negative for chest pain and leg swelling.  Gastrointestinal: Negative for abdominal distention and abdominal pain.  Endocrine: Positive for hot flashes.  Genitourinary: Negative for difficulty urinating, dysuria and frequency.   Musculoskeletal: Negative for arthralgias.  Skin: Negative for itching and rash.  Neurological: Negative for light-headedness and numbness.  Hematological: Negative for adenopathy. Does not bruise/bleed easily.  Psychiatric/Behavioral: Negative for confusion.     MEDICAL HISTORY:  Past Medical History:  Diagnosis Date   Depression    recently went on disability d/t diagnosis   History of recent fall 01/2018    missed the last step on ladder, causing back discomfort   Hypertension    Prostate cancer ([place]) 12/2017   prostate    SURGICAL HISTORY: Past Surgical History:  Procedure Laterality Date   BACK SURGERY  2001    2 rods and 6 screws in back   Left shoulder surgery Left 1998   removed a piece of bone around collar bone   PROSTATE BIOPSY N/A 02/28/2018   Procedure: PROSTATE BIOPSY;  Surgeon: Abbie Sons, MD;  Location: [facility] ORS;  Service: Urology;  Laterality: N/A;   TRANSRECTAL ULTRASOUND N/A 02/28/2018   Procedure: TRANSRECTAL ULTRASOUND;  Surgeon: Abbie Sons, MD;  Location: [facility] ORS;  Service: Urology;  Laterality: N/A;    SOCIAL HISTORY: Social History   Socioeconomic History   Marital status: Single    Spouse name: Not on file   Number of children: 4   Years of education: Not on file   Highest education level: Not on file  Occupational History   Occupation: disability  Social Designer, fashion/clothing strain: Very hard   Food insecurity    Worry: Sometimes true    Inability: Often true   Transportation needs    Medical: No    Non-medical: Yes  Tobacco Use   Smoking status: Current Every Day Smoker    Packs/day: 2.00    Types: Cigars   Smokeless tobacco: Never Used   Tobacco comment: 2 cigars  Substance and Sexual Activity   Alcohol use: Yes    Alcohol/week: 9.0 standard drinks    Types: 9 Cans of beer per week    Comment: occasional   Drug use: Yes    Types: Cocaine, "Crack" cocaine    Comment: last use about 3 days ago    Sexual activity: Not Currently  Lifestyle   Physical activity    Days per week: 7 days    Minutes per session: 60 min   Stress: Rather much  Relationships   Social connections    Talks on phone: Not on file    Gets together: Not on file    Attends religious service: Not on file    Active member of club or organization: Not on file    Attends meetings of clubs or organizations: Not on file     Relationship status: Not on file   Intimate partner violence    Fear of current or ex partner: Not on file    Emotionally abused: Not on file    Physically abused: Not on file    Forced sexual activity: Not on file  Other Topics Concern   Not on file  Social History Narrative   Not on file    FAMILY HISTORY: Family History  Problem Relation Age of Onset   Stroke Mother    Kidney failure Mother    Diabetes Maternal Aunt     ALLERGIES:  is allergic to penicillins and lactose intolerance (gi).  MEDICATIONS:  Current Outpatient Medications  Medication Sig Dispense Refill   losartan (COZAAR) 50 MG tablet Take 50 mg by mouth daily after breakfast.   2   venlafaxine (EFFEXOR) 37.5 MG tablet Take 1 tablet (37.5 mg total) by mouth daily. 30 tablet 0   vitamin B-12 (CYANOCOBALAMIN) 500 MCG tablet Take 500  mcg by mouth 3 (three) times a week.     No current facility-administered medications for this visit.      PHYSICAL EXAMINATION: ECOG PERFORMANCE STATUS: 1 - Symptomatic but completely ambulatory Vitals:   05/01/19 1015  BP: (!) 180/89  Pulse: (!) 58  Resp: 18  Temp: (!) 96.7 F (35.9 C)   Filed Weights   05/01/19 1015  Weight: 207 lb (93.9 kg)    Physical Exam Constitutional:      General: He is not in acute distress. HENT:     Head: Normocephalic and atraumatic.  Eyes:     General: No scleral icterus.    Conjunctiva/sclera: Conjunctivae normal.     Pupils: Pupils are equal, round, and reactive to light.  Neck:     Musculoskeletal: Normal range of motion and neck supple.  Cardiovascular:     Rate and Rhythm: Normal rate and regular rhythm.     Heart sounds: Normal heart sounds.  Pulmonary:     Effort: Pulmonary effort is normal. No respiratory distress.     Breath sounds: No wheezing.  Abdominal:     General: Bowel sounds are normal. There is no distension.     Palpations: Abdomen is soft. There is no mass.     Tenderness: There is no abdominal  tenderness.  Musculoskeletal: Normal range of motion.        General: No deformity.  Lymphadenopathy:     Cervical: No cervical adenopathy.  Skin:    General: Skin is warm and dry.     Findings: No erythema or rash.  Neurological:     Mental Status: He is alert and oriented to person, place, and time.     Cranial Nerves: No cranial nerve deficit.     Coordination: Coordination normal.  Psychiatric:        Behavior: Behavior normal.        Thought Content: Thought content normal.      LABORATORY DATA:  I have reviewed the data as listed Lab Results  Component Value Date   WBC 5.1 04/14/2019   HGB 12.2 (L) 04/14/2019   HCT 37.3 (L) 04/14/2019   MCV 101.1 (H) 04/14/2019   PLT 147 (L) 04/14/2019   Recent Labs    06/16/18 0823 08/22/18 1322 04/14/19 1049  NA 140 138 138  K 3.9 4.3 3.7  CL 105 104 104  CO2 '25 25 25  ' GLUCOSE 96 106* 107*  BUN 32* 30* 34*  CREATININE 1.48* 1.51* 1.47*  CALCIUM 9.8 9.7 8.8*  GFRNONAA 48* 48* 49*  GFRAA 56* 55* 57*  PROT 6.8 7.3 7.1  ALBUMIN 4.1 4.1 3.9  AST '29 26 28  ' ALT '18 16 14  ' ALKPHOS 70 90 118  BILITOT 0.8 0.5 0.5   Iron/TIBC/Ferritin/ %Sat No results found for: IRON, TIBC, FERRITIN, IRONPCTSAT   RADIOGRAPHIC STUDIES: I have personally reviewed the radiological images as listed and agreed with the findings in the report. PET scan  Patient had an enlarged left external iliac node measuring 1.4 cm in short axis with maximum SUV 2.5.  Although activity level is relatively low, given the size of the lymph node suspicion is raised for the possibility of metastatic disease. There is also a 7 mm right external iliac lymph node and a 1.2 cm left inguinal lymph node demonstrate only low metabolic activity.  Not overtly enlarged.  [name], faint sclerosis in the right anterior sixth rib.  Neither  has significant metabolic activity.  Significance uncertain. Benign-appearing rib  fractures with associated low-grade activity along several right anterior ribs include the right ninth rib. Activity in the cecum without associated CT abnormality.  Most likely benign. Ct Abdomen Pelvis Wo Contrast  Result Date: 04/28/2019 CLINICAL DATA:  Prostate cancer restaging. EXAM: CT CHEST, ABDOMEN AND PELVIS WITHOUT CONTRAST TECHNIQUE: Multidetector CT imaging of the chest, abdomen and pelvis was performed following the standard protocol without IV contrast. COMPARISON:  PET-CT from 03/11/2018 FINDINGS: CT CHEST FINDINGS Cardiovascular: Descending thoracic aortic and left anterior descending coronary artery atherosclerotic calcification. Mediastinum/Nodes: Unremarkable Lungs/Pleura: Stable 3 by 4 mm lingular subpleural nodule on image 97/4. Airway thickening is present, suggesting bronchitis or reactive airways disease. Musculoskeletal: Degenerated right AC joint. Wide left AC joint, query prior distal clavicular resection on the right. Degenerative right sternoclavicular arthropathy. Subtle sclerosis anteriorly in the right third rib on image 58/4, not readily appreciable on the prior exam subtle sclerosis posteriorly in the right second rib on image 39/4, not well appreciated on the prior exam. Mild sclerosis inferiorly in the left lateral third rib. Small amount of lateral cortical thickening of the left lateral sixth rib on image 106/4. There is sclerosis in the left T2 pedicle, image 110/6, not previously hypermetabolic. Sclerosis along the posteroinferior T6 vertebra measuring 1.3 by 1.1 cm on image 95/6, previously there was only about a 3 mm small focus of sclerosis at this location. Additional small foci of sclerosis in several ribs. Increased size of a focus of sclerosis in the T12 vertebral body, 1.3 by 0.9 cm on image 81/6. CT ABDOMEN PELVIS FINDINGS Hepatobiliary: Unremarkable Pancreas: Unremarkable Spleen: Unremarkable Adrenals/Urinary Tract: Both adrenal glands appear normal. 1.8 cm in  diameter fluid density lesion of the right kidney upper pole most compatible with a cyst. The kidneys appear otherwise unremarkable. Bladder unremarkable. Stomach/Bowel: Possible mild wall thickening in the rectum. Otherwise unremarkable. Vascular/Lymphatic: Aortoiliac atherosclerotic vascular disease. Left inguinal lymph node 1.2 cm in short axis, previously the same. Left external iliac node 0.9 cm in short axis, formerly 1.4 cm. Reproductive: Punctate calcifications in the prostate gland. Other: No supplemental non-categorized findings. Musculoskeletal: Postoperative findings in the lower lumbar spine. Graft harvesting site from the right iliac bone. Fused what right sacroiliac joint with at least partial bridging of the left sacroiliac joint. Large synovial herniation pit of the right hip. Inferior endplate sclerosis at L3 surrounding a Schmorl's node, image 74/5. IMPRESSION: 1. Enlarging sclerotic focus along the posteroinferior T6 vertebral body, [name]. Some of these were present and not significantly hypermetabolic on the prior PET-CT, but overall the appearance does raise concern for osseous metastatic disease. 2. Previous left pelvic adenopathy has significantly improved, with the left external iliac node at 0.9 cm in short axis, formerly 1.4 cm. 3. Other imaging findings of potential clinical significance: Aortic Atherosclerosis (ICD10-I70.0). Coronary atherosclerosis. Airway thickening is present, suggesting bronchitis or reactive airways disease. Possible wall thickening in the rectum, nonspecific. Electronically Signed   By: Van Clines M.D.   On: 04/28/2019 10:42   Ct Chest Wo Contrast  Result Date: 04/28/2019 CLINICAL DATA:  Prostate cancer restaging. EXAM: CT CHEST, ABDOMEN AND PELVIS WITHOUT CONTRAST TECHNIQUE: Multidetector CT imaging of the chest,  abdomen and pelvis was performed following the standard protocol without IV contrast. COMPARISON:  PET-CT from 03/11/2018 FINDINGS: CT CHEST FINDINGS Cardiovascular: Descending thoracic  aortic and left anterior descending coronary artery atherosclerotic calcification. Mediastinum/Nodes: Unremarkable Lungs/Pleura: Stable 3 by 4 mm lingular subpleural nodule on image 97/4. Airway thickening is present, suggesting bronchitis or reactive airways disease. Musculoskeletal: Degenerated right AC joint. Wide left AC joint, query prior distal clavicular resection on the right. Degenerative right sternoclavicular arthropathy. Subtle sclerosis anteriorly in the right third rib on image 58/4, not readily appreciable on the prior exam subtle sclerosis posteriorly in the right second rib on image 39/4, not well appreciated on the prior exam. Mild sclerosis inferiorly in the left lateral third rib. Small amount of lateral cortical thickening of the left lateral sixth rib on image 106/4. There is sclerosis in the left T2 pedicle, image 110/6, not previously hypermetabolic. Sclerosis along the posteroinferior T6 vertebra measuring 1.3 by 1.1 cm on image 95/6, previously there was only about a 3 mm small focus of sclerosis at this location. Additional small foci of sclerosis in several ribs. Increased size of a focus of sclerosis in the T12 vertebral body, 1.3 by 0.9 cm on image 81/6. CT ABDOMEN PELVIS FINDINGS Hepatobiliary: Unremarkable Pancreas: Unremarkable Spleen: Unremarkable Adrenals/Urinary Tract: Both adrenal glands appear normal. 1.8 cm in diameter fluid density lesion of the right kidney upper pole most compatible with a cyst. The kidneys appear otherwise unremarkable. Bladder unremarkable. Stomach/Bowel: Possible mild wall thickening in the rectum. Otherwise unremarkable. Vascular/Lymphatic: Aortoiliac atherosclerotic vascular disease. Left inguinal lymph node 1.2 cm in short axis, previously the same. Left external iliac  node 0.9 cm in short axis, formerly 1.4 cm. Reproductive: Punctate calcifications in the prostate gland. Other: No supplemental non-categorized findings. Musculoskeletal: Postoperative findings in the lower lumbar spine. Graft harvesting site from the right iliac bone. Fused what right sacroiliac joint with at least partial bridging of the left sacroiliac joint. Large synovial herniation pit of the right hip. Inferior endplate sclerosis at L3 surrounding a Schmorl's node, image 74/5. IMPRESSION: 1. Enlarging sclerotic focus along the posteroinferior T6 vertebral body, [name]. Some of these were present and not significantly hypermetabolic on the prior PET-CT, but overall the appearance does raise concern for osseous metastatic disease. 2. Previous left pelvic adenopathy has significantly improved, with the left external iliac node at 0.9 cm in short axis, formerly 1.4 cm. 3. Other imaging findings of potential clinical significance: Aortic Atherosclerosis (ICD10-I70.0). Coronary atherosclerosis. Airway thickening is present, suggesting bronchitis or reactive airways disease. Possible wall thickening in the rectum, nonspecific. Electronically Signed   By: Van Clines M.D.   On: 04/28/2019 10:42   Nm Bone Scan Whole Body  Result Date: 04/28/2019 CLINICAL DATA:  Prostate cancer. EXAM: NUCLEAR MEDICINE WHOLE BODY BONE SCAN TECHNIQUE: Whole body anterior and posterior images were obtained approximately 3 hours after intravenous injection of radiopharmaceutical. RADIOPHARMACEUTICALS:  22.5 mCi Technetium-57mMDP IV COMPARISON:  CT 04/28/2019 FINDINGS: Subtle foci of radiotracer activity within the anterolateral aspect of the LEFT and RIGHT third rib corresponds to sclerotic lesion on comparison CT and consistent with skeletal metastasis. Focal uptake in the T6 vertebral  body also corresponds to sclerotic lesion on comparison CT. Focal uptake in the T1 region also corresponds to sclerotic lesion the pedicle on comparison CT. Focal uptake in the medial RIGHT clavicle may be degenerative. Probable degenerative uptake in LEFT acetabulum. IMPRESSION: 1. Focal uptake within the third ribs, T6 and T1 vertebral body correspond to sclerotic lesions on comparison  CT and consistent with skeletal metastasis. 2. Probable degenerative uptake in the RIGHT clavicle and LEFT acetabulum. Electronically Signed   By: Suzy Bouchard M.D.   On: 04/28/2019 17:24     ASSESSMENT & PLAN:  1. Prostate cancer metastatic to bone ([place])   2. Goals of care, counseling/discussion   3. Bone metastasis ([place])   4. Prostate cancer ([place])   5. B12 deficiency   Cancer Staging Prostate cancer [facility]) Staging form: Prostate, AJCC 8th Edition - Clinical stage from 03/19/2018: Stage IVA (cTX, cN1, cM0) - Signed by [name], MD on 03/19/2018  #Metastatic prostate Cancer,  PSA 24.18. CT chest abdomen pelvis and bone scan images were independently reviewed by me and discussed with patient. Images are consistent with metastatic prostate cancer. Discussed with patient that his condition is not curable.Treatment is with palliative intent.  For castration sensitive metastatic prostate cancer, I recommend upfront docetaxel chemotherapy treatments. I explained to the patient the risks and benefits of chemotherapy docetaxel including all but not limited to infusion reaction, hair loss, hearing loss, mouth sore, nausea, vomiting, low blood counts, bleeding, heart failure, kidney failure and risk of life threatening infection and even death, secondary malignancy etc.   Patient voices understanding and willing to proceed chemotherapy.  Growth factor-Neulasta Onpro kit would be given as prophylaxis for chemotherapy-induced neutropenia to prevent febrile neutropenias. Discussed potential side effect-  myalgias/arthralgias- recommend Claritin for 4 days.   # Chemotherapy education; port placement.  Antiemetics-Zofran and Compazine; EMLA cream sent to pharmacy #Facial flash and night sweating, testosterone level within normal range.  TSH normal.  Continue Effexor 37.5 mg daily. # Macrocytic anemia, hemoglobin stable.  MCV 101.  Normal folate and low B12. Will start patient on B12 injections.    Orders Placed This Encounter  Procedures   CBC with Differential/Platelet    Standing Status:   Future    Standing Expiration Date:   04/30/2020   Comprehensive metabolic panel    Standing Status:   Future    Standing Expiration Date:   04/30/2020   PSA    Standing Status:   Future    Standing Expiration Date:   04/30/2020   Testosterone    Standing Status:   Future    Standing Expiration Date:   04/30/2020    We spent sufficient time to discuss many aspect of care, questions were answered to patient's satisfaction. We spent sufficient time to discuss many aspect of care, questions were answered to patient's satisfaction. Total face to face encounter time for this patient visit was 40 min. >50% of the time was  spent in counseling and coordination of care.    [name], MD, PhD Hematology Oncology [facility] [place] [facility] Pager- [phone] 05/01/2019

## 2019-05-01 NOTE — Progress Notes (Signed)
START ON PATHWAY REGIMEN - Prostate     A cycle is every 21 days:     Docetaxel   **Always confirm dose/schedule in your pharmacy ordering system**  Patient Characteristics: Adenocarcinoma, Distant Metastases, Hormone Naive Histology: Adenocarcinoma Therapeutic Status: Distant Metastases  Intent of Therapy: Non-Curative / Palliative Intent, Discussed with Patient 

## 2019-05-04 ENCOUNTER — Other Ambulatory Visit: Payer: Self-pay

## 2019-05-04 NOTE — Patient Instructions (Signed)
Docetaxel injection What is this medicine? DOCETAXEL (doe se TAX el) is a chemotherapy drug. It targets fast dividing cells, like cancer cells, and causes these cells to die. This medicine is used to treat many types of cancers like breast cancer, certain stomach cancers, head and neck cancer, lung cancer, and prostate cancer. This medicine may be used for other purposes; ask your health care provider or pharmacist if you have questions. COMMON BRAND NAME(S): Docefrez, Taxotere What should I tell my health care provider before I take this medicine? They need to know if you have any of these conditions:  infection (especially a virus infection such as chickenpox, cold sores, or herpes)  liver disease  low blood counts, like low white cell, platelet, or red cell counts  an unusual or allergic reaction to docetaxel, polysorbate 80, other chemotherapy agents, other medicines, foods, dyes, or preservatives  pregnant or trying to get pregnant  breast-feeding How should I use this medicine? This drug is given as an infusion into a vein. It is administered in a hospital or clinic by a specially trained health care professional. Talk to your pediatrician regarding the use of this medicine in children. Special care may be needed. Overdosage: If you think you have taken too much of this medicine contact a poison control center or emergency room at once. NOTE: This medicine is only for you. Do not share this medicine with others. What if I miss a dose? It is important not to miss your dose. Call your doctor or health care professional if you are unable to keep an appointment. What may interact with this medicine?  aprepitant  certain antibiotics like erythromycin or clarithromycin  certain antivirals for HIV or hepatitis  certain medicines for fungal infections like fluconazole, itraconazole, ketoconazole, posaconazole, or  voriconazole  cimetidine  ciprofloxacin  conivaptan  cyclosporine  dronedarone  fluvoxamine  grapefruit juice  imatinib  verapamil This list may not describe all possible interactions. Give your health care provider a list of all the medicines, herbs, non-prescription drugs, or dietary supplements you use. Also tell them if you smoke, drink alcohol, or use illegal drugs. Some items may interact with your medicine. What should I watch for while using this medicine? Your condition will be monitored carefully while you are receiving this medicine. You will need important blood work done while you are taking this medicine. Call your doctor or health care professional for advice if you get a fever, chills or sore throat, or other symptoms of a cold or flu. Do not treat yourself. This drug decreases your body's ability to fight infections. Try to avoid being around people who are sick. Some products may contain alcohol. Ask your health care professional if this medicine contains alcohol. Be sure to tell all health care professionals you are taking this medicine. Certain medicines, like metronidazole and disulfiram, can cause an unpleasant reaction when taken with alcohol. The reaction includes flushing, headache, nausea, vomiting, sweating, and increased thirst. The reaction can last from 30 minutes to several hours. You may get drowsy or dizzy. Do not drive, use machinery, or do anything that needs mental alertness until you know how this medicine affects you. Do not stand or sit up quickly, especially if you are an older patient. This reduces the risk of dizzy or fainting spells. Alcohol may interfere with the effect of this medicine. Talk to your health care professional about your risk of cancer. You may be more at risk for certain types of cancer if   you take this medicine. Do not become pregnant while taking this medicine or for 6 months after stopping it. Women should inform their doctor if  they wish to become pregnant or think they might be pregnant. There is a potential for serious side effects to an unborn child. Talk to your health care professional or pharmacist for more information. Do not breast-feed an infant while taking this medicine or for 1 to 2 weeks after stopping it. Males who get this medicine must use a condom during sex with females who can get pregnant. If you get a woman pregnant, the baby could have birth defects. The baby could die before they are born. You will need to continue wearing a condom for 3 months after stopping the medicine. Tell your health care provider right away if your partner becomes pregnant while you are taking this medicine. This may interfere with the ability to father a child. You should talk to your doctor or health care professional if you are concerned about your fertility. What side effects may I notice from receiving this medicine? Side effects that you should report to your doctor or health care professional as soon as possible:  allergic reactions like skin rash, itching or hives, swelling of the face, lips, or tongue  blurred vision  breathing problems  changes in vision  low blood counts - This drug may decrease the number of white blood cells, red blood cells and platelets. You may be at increased risk for infections and bleeding.  nausea and vomiting  pain, redness or irritation at site where injected  pain, tingling, numbness in the hands or feet  redness, blistering, peeling, or loosening of the skin, including inside the mouth  signs of decreased platelets or bleeding - bruising, pinpoint red spots on the skin, black, tarry stools, nosebleeds  signs of decreased red blood cells - unusually weak or tired, fainting spells, lightheadedness  signs of infection - fever or chills, cough, sore throat, pain or difficulty passing urine  swelling of the ankle, feet, hands Side effects that usually do not require medical  attention (report to your doctor or health care professional if they continue or are bothersome):  constipation  diarrhea  fingernail or toenail changes  hair loss  loss of appetite  mouth sores  muscle pain This list may not describe all possible side effects. Call your doctor for medical advice about side effects. You may report side effects to FDA at 1-800-FDA-1088. Where should I keep my medicine? This drug is given in a hospital or clinic and will not be stored at home. NOTE: This sheet is a summary. It may not cover all possible information. If you have questions about this medicine, talk to your doctor, pharmacist, or health care provider.  2020 [employer] (2018-09-12 12:23:11)  

## 2019-05-05 ENCOUNTER — Inpatient Hospital Stay: Payer: Medicare HMO

## 2019-05-05 ENCOUNTER — Inpatient Hospital Stay (HOSPITAL_BASED_OUTPATIENT_CLINIC_OR_DEPARTMENT_OTHER): Payer: Medicare HMO | Admitting: Nurse Practitioner

## 2019-05-05 ENCOUNTER — Other Ambulatory Visit: Payer: Medicare HMO

## 2019-05-05 ENCOUNTER — Other Ambulatory Visit
Admission: RE | Admit: 2019-05-05 | Discharge: 2019-05-05 | Disposition: A | Discharge status: Home / Self Care | Payer: Medicare HMO | Source: Ambulatory Visit | Attending: Oncology | Admitting: Oncology

## 2019-05-05 ENCOUNTER — Other Ambulatory Visit: Payer: Self-pay

## 2019-05-05 DIAGNOSIS — C61 Malignant neoplasm of prostate: Secondary | ICD-10-CM

## 2019-05-05 DIAGNOSIS — Z20828 Contact with and (suspected) exposure to other viral communicable diseases: Secondary | ICD-10-CM | POA: Insufficient documentation

## 2019-05-05 DIAGNOSIS — Z5111 Encounter for antineoplastic chemotherapy: Secondary | ICD-10-CM | POA: Diagnosis not present

## 2019-05-05 DIAGNOSIS — Z79818 Long term (current) use of other agents affecting estrogen receptors and estrogen levels: Secondary | ICD-10-CM

## 2019-05-05 DIAGNOSIS — Z01812 Encounter for preprocedural laboratory examination: Secondary | ICD-10-CM | POA: Diagnosis present

## 2019-05-05 DIAGNOSIS — R972 Elevated prostate specific antigen [PSA]: Secondary | ICD-10-CM

## 2019-05-05 MED ORDER — CYANOCOBALAMIN 1000 MCG/ML IJ SOLN
1000.0000 ug | Freq: Once | INTRAMUSCULAR | Status: AC
  Administered 2019-05-05: 12:00:00 1000 ug via INTRAMUSCULAR

## 2019-05-05 NOTE — Progress Notes (Signed)
[place]  Telephone:([phone] [phone]  Virtual Visit Progress Note  I connected with Terry Mitchell on 05/05/19 at  3:30 PM EDT by telephone visit and verified that I am speaking with the correct person using two identifiers.   I discussed the limitations, risks, security and privacy concerns of performing an evaluation and management service by telemedicine and the availability of in-person appointments. I also discussed with the patient that there may be a patient responsible charge related to this service. The patient expressed understanding and agreed to proceed.   Other persons participating in the visit and their role in the encounter:   Patients location: home Providers location: clinic  Chief Complaint:   Patient Care Team: [name], MD as PCP - General (Family Medicine)   Name of the patient: Terry Mitchell  [id number]  16-Jan-1951   Date of visit: 05/05/19  Diagnosis- Metastatic Prostate Cancer  Chief complaint/Reason for visit- Initial Meeting for Terry Mitchell, preparing for starting chemotherapy  Heme/Onc history:  Oncology History  Prostate cancer ([place])  03/19/2018 Initial Diagnosis   Prostate cancer ([place])   03/19/2018 Cancer Staging   Staging form: Prostate, AJCC 8th Edition - Clinical stage from 03/19/2018: Stage IVB (cTX, cN1, cM1b) - Signed by [name], MD on 05/01/2019   05/12/2019 -  Chemotherapy   The patient had pegfilgrastim-jmdb (FULPHILA) injection 6 mg, 6 mg, Subcutaneous,  Once, 0 of 4 cycles DOCEtaxel (TAXOTERE) 160 mg in sodium chloride 0.9 % 250 mL chemo infusion, 75 mg/m2, Intravenous,  Once, 0 of 4 cycles  for chemotherapy treatment.      Interval history-  Terry Mitchell, 68 year old male patient, newly diagnosed with prostate cancer, who presents to chemo care clinic today for initial meeting in preparation for starting chemotherapy. I introduced the chemo care  clinic and we discussed that the role of the clinic is to assist those who are at an increased risk of emergency room visits and/or complications during the course of chemotherapy treatment. We discussed that the increased risk takes into account factors such as age, performance status, and co-morbidities. We also discussed that for some, this might include barriers to care such as not having a primary care provider, lack of insurance/transportation, or not being able to afford medications. We discussed that the goal of the program is to help prevent unplanned ER visits and help reduce complications during chemotherapy. We do this by discussing specific risk factors to each individual and identifying ways that we can help improve these risk factors and reduce barriers to care.   ECOG FS:1 - Symptomatic but completely ambulatory  Review of systems- Review of Systems  Constitutional: Positive for diaphoresis (hot flashes) and malaise/fatigue. Negative for chills, fever and weight loss.  HENT: Negative for hearing loss, nosebleeds, sore throat and tinnitus.   Eyes: Negative for blurred vision and double vision.  Respiratory: Negative for cough, hemoptysis, shortness of breath and wheezing.   Cardiovascular: Negative for chest pain, palpitations and leg swelling.  Gastrointestinal: Negative for abdominal pain, blood in stool, constipation, diarrhea, melena, nausea and vomiting.  Genitourinary: Negative for dysuria and urgency.  Musculoskeletal: Negative for back pain, falls, joint pain and myalgias.  Skin: Negative for itching and rash.  Neurological: Negative for dizziness, tingling, sensory change, loss of consciousness, weakness and headaches.  Endo/Heme/Allergies: Negative for environmental allergies. Does not bruise/bleed easily.  Psychiatric/Behavioral: Negative for depression. The patient is not nervous/anxious and does  not have insomnia.      Allergies  Allergen Reactions   Penicillins  Hives and Other (See Comments)    Hives, welts, swelling profusely . Pretty severe reaction Has patient had a PCN reaction causing immediate rash, facial/tongue/throat swelling, SOB or lightheadedness with hypotension: No Has patient had a PCN reaction causing severe rash involving mucus membranes or skin necrosis: Yes Has patient had a PCN reaction that required hospitalization: Yes Has patient had a PCN reaction occurring within the last 10 years: No If all of the above answers are "NO", then may proceed with Cephalosporin use.    Lactose Intolerance (Gi) Other (See Comments)    Gi upset    Past Medical History:  Diagnosis Date   Depression    recently went on disability d/t diagnosis   History of recent fall 01/2018   missed the last step on ladder, causing back discomfort   Hypertension    Prostate cancer ([place]) 12/2017   prostate    Past Surgical History:  Procedure Laterality Date   BACK SURGERY  2001    2 rods and 6 screws in back   Left shoulder surgery Left 1998   removed a piece of bone around collar bone   PROSTATE BIOPSY N/A 02/28/2018   Procedure: PROSTATE BIOPSY;  Surgeon: Abbie Sons, MD;  Location: [facility] ORS;  Service: Urology;  Laterality: N/A;   TRANSRECTAL ULTRASOUND N/A 02/28/2018   Procedure: TRANSRECTAL ULTRASOUND;  Surgeon: Abbie Sons, MD;  Location: [facility] ORS;  Service: Urology;  Laterality: N/A;    Social History   Socioeconomic History   Marital status: Single    Spouse name: Not on file   Number of children: 4   Years of education: Not on file   Highest education level: Not on file  Occupational History   Occupation: disability  Social Designer, fashion/clothing strain: Very hard   Food insecurity    Worry: Sometimes true    Inability: Often true   Transportation needs    Medical: No    Non-medical: Yes  Tobacco Use   Smoking status: Current Every Day Smoker    Packs/day: 2.00    Types: Cigars   Smokeless  tobacco: Never Used   Tobacco comment: 2 cigars  Substance and Sexual Activity   Alcohol use: Yes    Alcohol/week: 9.0 standard drinks    Types: 9 Cans of beer per week    Comment: occasional   Drug use: Yes    Types: Cocaine, "Crack" cocaine    Comment: last use about 3 days ago    Sexual activity: Not Currently  Lifestyle   Physical activity    Days per week: 7 days    Minutes per session: 60 min   Stress: Rather much  Relationships   Social connections    Talks on phone: Not on file    Gets together: Not on file    Attends religious service: Not on file    Active member of club or organization: Not on file    Attends meetings of clubs or organizations: Not on file    Relationship status: Not on file   Intimate partner violence    Fear of current or ex partner: Not on file    Emotionally abused: Not on file    Physically abused: Not on file    Forced sexual activity: Not on file  Other Topics Concern   Not on file  Social History Narrative   Not  on file  Stays at a boarding house. Has a girlfriend, Lourdes Sledge. Does not drive.   Family History  Problem Relation Age of Onset   Stroke Mother    Kidney failure Mother    Diabetes Maternal Aunt      Current Outpatient Medications:    dexamethasone (DECADRON) 4 MG tablet, Take 2 tablets (8 mg total) by mouth 2 (two) times daily. Start the day before Taxotere. Then daily after chemo for 2 days., Disp: 30 tablet, Rfl: 1   losartan (COZAAR) 50 MG tablet, Take 50 mg by mouth daily after breakfast. , Disp: , Rfl: 2   ondansetron (ZOFRAN) 8 MG tablet, Take 1 tablet (8 mg total) by mouth 2 (two) times daily as needed for refractory nausea / vomiting., Disp: 30 tablet, Rfl: 1   prochlorperazine (COMPAZINE) 10 MG tablet, Take 1 tablet (10 mg total) by mouth every 6 (six) hours as needed (Nausea or vomiting)., Disp: 30 tablet, Rfl: 1   venlafaxine (EFFEXOR) 37.5 MG tablet, Take 1 tablet (37.5 mg total) by mouth  daily., Disp: 30 tablet, Rfl: 0   vitamin B-12 (CYANOCOBALAMIN) 500 MCG tablet, Take 500 mcg by mouth 3 (three) times a week., Disp: , Rfl:   Physical exam: There were no vitals filed for this visit.   CMP Latest Ref Rng & Units 04/14/2019  Glucose 70 - 99 mg/dL 107(H)  BUN 8 - 23 mg/dL 34(H)  Creatinine 0.61 - 1.24 mg/dL 1.47(H)  Sodium 135 - 145 mmol/L 138  Potassium 3.5 - 5.1 mmol/L 3.7  Chloride 98 - 111 mmol/L 104  CO2 22 - 32 mmol/L 25  Calcium 8.9 - 10.3 mg/dL 8.8(L)  Total Protein 6.5 - 8.1 g/dL 7.1  Total Bilirubin 0.3 - 1.2 mg/dL 0.5  Alkaline Phos 38 - 126 U/L 118  AST 15 - 41 U/L 28  ALT 0 - 44 U/L 14   CBC Latest Ref Rng & Units 04/14/2019  WBC 4.0 - 10.5 K/uL 5.1  Hemoglobin 13.0 - 17.0 g/dL 12.2(L)  Hematocrit 39.0 - 52.0 % 37.3(L)  Platelets 150 - 400 K/uL 147(L)    No images are attached to the encounter.  Ct Abdomen Pelvis Wo Contrast  Result Date: 04/28/2019 CLINICAL DATA:  Prostate cancer restaging. EXAM: CT CHEST, ABDOMEN AND PELVIS WITHOUT CONTRAST TECHNIQUE: Multidetector CT imaging of the chest, abdomen and pelvis was performed following the standard protocol without IV contrast. COMPARISON:  PET-CT from 03/11/2018 FINDINGS: CT CHEST FINDINGS Cardiovascular: Descending thoracic aortic and left anterior descending coronary artery atherosclerotic calcification. Mediastinum/Nodes: Unremarkable Lungs/Pleura: Stable 3 by 4 mm lingular subpleural nodule on image 97/4. Airway thickening is present, suggesting bronchitis or reactive airways disease. Musculoskeletal: Degenerated right AC joint. Wide left AC joint, query prior distal clavicular resection on the right. Degenerative right sternoclavicular arthropathy. Subtle sclerosis anteriorly in the right third rib on image 58/4, not readily appreciable on the prior exam subtle sclerosis posteriorly in the right second rib on image 39/4, not well appreciated on the prior exam. Mild sclerosis inferiorly in the left  lateral third rib. Small amount of lateral cortical thickening of the left lateral sixth rib on image 106/4. There is sclerosis in the left T2 pedicle, image 110/6, not previously hypermetabolic. Sclerosis along the posteroinferior T6 vertebra measuring 1.3 by 1.1 cm on image 95/6, previously there was only about a 3 mm small focus of sclerosis at this location. Additional small foci of sclerosis in several ribs. Increased size of a focus of sclerosis in  the T12 vertebral body, 1.3 by 0.9 cm on image 81/6. CT ABDOMEN PELVIS FINDINGS Hepatobiliary: Unremarkable Pancreas: Unremarkable Spleen: Unremarkable Adrenals/Urinary Tract: Both adrenal glands appear normal. 1.8 cm in diameter fluid density lesion of the right kidney upper pole most compatible with a cyst. The kidneys appear otherwise unremarkable. Bladder unremarkable. Stomach/Bowel: Possible mild wall thickening in the rectum. Otherwise unremarkable. Vascular/Lymphatic: Aortoiliac atherosclerotic vascular disease. Left inguinal lymph node 1.2 cm in short axis, previously the same. Left external iliac node 0.9 cm in short axis, formerly 1.4 cm. Reproductive: Punctate calcifications in the prostate gland. Other: No supplemental non-categorized findings. Musculoskeletal: Postoperative findings in the lower lumbar spine. Graft harvesting site from the right iliac bone. Fused what right sacroiliac joint with at least partial bridging of the left sacroiliac joint. Large synovial herniation pit of the right hip. Inferior endplate sclerosis at L3 surrounding a Schmorl's node, image 74/5. IMPRESSION: 1. Enlarging sclerotic focus along the posteroinferior T6 vertebral body, [name]. Some of these were present and not significantly hypermetabolic on the prior PET-CT, but overall the appearance does raise concern for osseous metastatic  disease. 2. Previous left pelvic adenopathy has significantly improved, with the left external iliac node at 0.9 cm in short axis, formerly 1.4 cm. 3. Other imaging findings of potential clinical significance: Aortic Atherosclerosis (ICD10-I70.0). Coronary atherosclerosis. Airway thickening is present, suggesting bronchitis or reactive airways disease. Possible wall thickening in the rectum, nonspecific. Electronically Signed   By: Van Clines M.D.   On: 04/28/2019 10:42   Ct Chest Wo Contrast  Result Date: 04/28/2019 CLINICAL DATA:  Prostate cancer restaging. EXAM: CT CHEST, ABDOMEN AND PELVIS WITHOUT CONTRAST TECHNIQUE: Multidetector CT imaging of the chest, abdomen and pelvis was performed following the standard protocol without IV contrast. COMPARISON:  PET-CT from 03/11/2018 FINDINGS: CT CHEST FINDINGS Cardiovascular: Descending thoracic aortic and left anterior descending coronary artery atherosclerotic calcification. Mediastinum/Nodes: Unremarkable Lungs/Pleura: Stable 3 by 4 mm lingular subpleural nodule on image 97/4. Airway thickening is present, suggesting bronchitis or reactive airways disease. Musculoskeletal: Degenerated right AC joint. Wide left AC joint, query prior distal clavicular resection on the right. Degenerative right sternoclavicular arthropathy. Subtle sclerosis anteriorly in the right third rib on image 58/4, not readily appreciable on the prior exam subtle sclerosis posteriorly in the right second rib on image 39/4, not well appreciated on the prior exam. Mild sclerosis inferiorly in the left lateral third rib. Small amount of lateral cortical thickening of the left lateral sixth rib on image 106/4. There is sclerosis in the left T2 pedicle, image 110/6, not previously hypermetabolic. Sclerosis along the posteroinferior T6 vertebra measuring 1.3 by 1.1 cm on image 95/6, previously there was only about a 3 mm small focus of sclerosis at this location. Additional small foci of  sclerosis in several ribs. Increased size of a focus of sclerosis in the T12 vertebral body, 1.3 by 0.9 cm on image 81/6. CT ABDOMEN PELVIS FINDINGS Hepatobiliary: Unremarkable Pancreas: Unremarkable Spleen: Unremarkable Adrenals/Urinary Tract: Both adrenal glands appear normal. 1.8 cm in diameter fluid density lesion of the right kidney upper pole most compatible with a cyst. The kidneys appear otherwise unremarkable. Bladder unremarkable. Stomach/Bowel: Possible mild wall thickening in the rectum. Otherwise unremarkable. Vascular/Lymphatic: Aortoiliac atherosclerotic vascular disease. Left inguinal lymph node 1.2 cm in short axis, previously the same. Left external iliac node 0.9 cm in short axis, formerly 1.4  cm. Reproductive: Punctate calcifications in the prostate gland. Other: No supplemental non-categorized findings. Musculoskeletal: Postoperative findings in the lower lumbar spine. Graft harvesting site from the right iliac bone. Fused what right sacroiliac joint with at least partial bridging of the left sacroiliac joint. Large synovial herniation pit of the right hip. Inferior endplate sclerosis at L3 surrounding a Schmorl's node, image 74/5. IMPRESSION: 1. Enlarging sclerotic focus along the posteroinferior T6 vertebral body, [name]. Some of these were present and not significantly hypermetabolic on the prior PET-CT, but overall the appearance does raise concern for osseous metastatic disease. 2. Previous left pelvic adenopathy has significantly improved, with the left external iliac node at 0.9 cm in short axis, formerly 1.4 cm. 3. Other imaging findings of potential clinical significance: Aortic Atherosclerosis (ICD10-I70.0). Coronary atherosclerosis. Airway thickening is present, suggesting bronchitis or reactive airways disease. Possible wall thickening in the  rectum, nonspecific. Electronically Signed   By: Van Clines M.D.   On: 04/28/2019 10:42   Nm Bone Scan Whole Body  Result Date: 04/28/2019 CLINICAL DATA:  Prostate cancer. EXAM: NUCLEAR MEDICINE WHOLE BODY BONE SCAN TECHNIQUE: Whole body anterior and posterior images were obtained approximately 3 hours after intravenous injection of radiopharmaceutical. RADIOPHARMACEUTICALS:  22.5 mCi Technetium-2m MDP IV COMPARISON:  CT 04/28/2019 FINDINGS: Subtle foci of radiotracer activity within the anterolateral aspect of the LEFT and RIGHT third rib corresponds to sclerotic lesion on comparison CT and consistent with skeletal metastasis. Focal uptake in the T6 vertebral body also corresponds to sclerotic lesion on comparison CT. Focal uptake in the T1 region also corresponds to sclerotic lesion the pedicle on comparison CT. Focal uptake in the medial RIGHT clavicle may be degenerative. Probable degenerative uptake in LEFT acetabulum. IMPRESSION: 1. Focal uptake within the third ribs, T6 and T1 vertebral body correspond to sclerotic lesions on comparison CT and consistent with skeletal metastasis. 2. Probable degenerative uptake in the RIGHT clavicle and LEFT acetabulum. Electronically Signed   By: Suzy Bouchard M.D.   On: 04/28/2019 17:24     Assessment and plan- Patient is a 68 y.o. male who presents to [facility] for initial meeting in preparation for starting chemotherapy for the treatment of metastatic prostate cancer.    1. Prostate cancer -stage IV (cTX, cN1, cM1).  Treatment given with palliative intent. Dr. Tasia Catchings recommended upfront docetaxel chemotherapy with Neulasta support.   2. Chemo Care Clinic/High Risk for ER/Hospitalization during chemotherapy- We discussed the role of the chemo care clinic and identified patient specific risk factors. I discussed that patient was identified as high risk primarily based on: Recent hospitalizations and ER visits, Medicaid status, Medicare status,  not identifying is being in a relationship however patient says he long has a long term girlfriend, history of anemia.  Patient has primary care provider whom he sees regularly.    Anemia-macrocytic anemia.  Hemoglobin stable.  MCV 101.  Normal folate and low B12.  Patient started on B12 injections.  Managed by Dr. Tasia Catchings.   3. Social Determinants of Health- we discussed that social determinants of health may have significant impacts on health and outcomes for cancer patients.  Today we discussed specific social determinants of performance status, alcohol use, depression, financial needs, food insecurity, housing, interpersonal violence, social connections, stress, tobacco use, and transportation.  After lengthy discussion the following we identified areas of need:   Depression-  we discussed [facility]-referral to [facility] counseling services, psychiatry for medication management, or palliative care/symptom management as well as primary care providers. Says he's coping well with his diagnosis. Says his faith is important to him and it's in God's hands.   Financial insecurity: We discussed that living with cancer can create tremendous financial burden.  We discussed options for assistance. I asked that if assistance is needed in affording medications or paying bills to please let us know so that we can provide assistance. Stays in a boarding house and utilities are paid by boarding house. No threat of eviction currently.   Performance status/activity level - ECOG 1. We discusssed that patients who participate in regular physical activity report fewer negative impacts of cancer and treatments and report less fatigue.   Based on alcohol use- drinks a couple beers 3-4 times a week. Cut out 'hard alcohol'. Encouraged to abstain from alcohol use during treatment.   History of cocaine use- does not use anymore. Stopped 1 year ago. History of 20 years of use. Encouraged to continue to abstain from use.   Based  on tobacco use- smokes cigar every now and again. Smoking cessation was encouraged.  We discussed options for management including medications and referral to quit Smart program  Based on transportation need: we discussed options for transportation including [employer], paratransit, bus routes, [employer] transit, taxi/uber/lyft, and cancer center van. Denies needs today but he will notify his primary oncology team who will help assist with arranging [place]ianne [facility]i transportation for appointments when needed. We also discussed options for transportation on short notice/acute visits.    4. Palliative Care- based on stage of cancer and/or identified needs today, I will refer patient to palliative care for goals of care and advanced care planning.  We also discussed the role of the Symptom Management Clinic at [facility] for acute issues and methods of contacting clinic/provider. He denies needing specific assistance at this time.   Visit Diagnosis 1. Prostate cancer [facility])    I discussed the assessment and treatment plan with the patient. The patient was provided an opportunity to ask questions and all were answered. The patient agreed with the plan and demonstrated an understanding of the instructions.   The patient was advised to call back or seek an in-person evaluation if the symptoms worsen or if the condition fails to improve as anticipated.   I provided 38 minutes of non face-to-face telephone visit time during this encounter, and > 50% was spent counseling as documented under my assessment & plan.  Beckey Rutter, DNP, AGNP-C [facility] [phone] (work cell) [phone] (office)  CC: Dr. Tasia Catchings

## 2019-05-05 NOTE — Progress Notes (Signed)
MD states no Firm[place] at this time.  Patient is starting chemo and Mills Koller could effect the efficacy of the treatment.

## 2019-05-06 LAB — SARS CORONAVIRUS 2 (TAT 6-24 HRS): SARS Coronavirus 2: NEGATIVE

## 2019-05-11 ENCOUNTER — Encounter: Payer: Self-pay | Admitting: Nurse Practitioner

## 2019-05-13 ENCOUNTER — Other Ambulatory Visit: Payer: Self-pay

## 2019-05-13 ENCOUNTER — Inpatient Hospital Stay: Payer: Medicare HMO

## 2019-05-13 DIAGNOSIS — C7951 Secondary malignant neoplasm of bone: Secondary | ICD-10-CM

## 2019-05-13 DIAGNOSIS — Z5111 Encounter for antineoplastic chemotherapy: Secondary | ICD-10-CM | POA: Diagnosis not present

## 2019-05-13 DIAGNOSIS — C61 Malignant neoplasm of prostate: Secondary | ICD-10-CM

## 2019-05-13 LAB — COMPREHENSIVE METABOLIC PANEL
ALT: 15 U/L (ref 0–44)
AST: 26 U/L (ref 15–41)
Albumin: 3.9 g/dL (ref 3.5–5.0)
Alkaline Phosphatase: 105 U/L (ref 38–126)
Anion gap: 9 (ref 5–15)
BUN: 30 mg/dL — ABNORMAL HIGH (ref 8–23)
CO2: 24 mmol/L (ref 22–32)
Calcium: 9.2 mg/dL (ref 8.9–10.3)
Chloride: 106 mmol/L (ref 98–111)
Creatinine, Ser: 1.36 mg/dL — ABNORMAL HIGH (ref 0.61–1.24)
GFR calc Af Amer: >60 mL/min (ref >60)
GFR calc non Af Amer: 54 mL/min — ABNORMAL LOW (ref >60)
Glucose, Bld: 135 mg/dL — ABNORMAL HIGH (ref 70–99)
Potassium: 3.6 mmol/L (ref 3.5–5.1)
Sodium: 139 mmol/L (ref 135–145)
Total Bilirubin: 0.6 mg/dL (ref 0.3–1.2)
Total Protein: 7.3 g/dL (ref 6.5–8.1)

## 2019-05-13 LAB — CBC WITH DIFFERENTIAL/PLATELET
Abs Immature Granulocytes: 0.02 10*3/uL (ref 0.00–0.07)
Basophils Absolute: 0 10*3/uL (ref 0.0–0.1)
Basophils Relative: 0 %
Eosinophils Absolute: 0.2 10*3/uL (ref 0.0–0.5)
Eosinophils Relative: 4 %
HCT: 37.9 % — ABNORMAL LOW (ref 39.0–52.0)
Hemoglobin: 12.3 g/dL — ABNORMAL LOW (ref 13.0–17.0)
Immature Granulocytes: 0 %
Lymphocytes Relative: 20 %
Lymphs Abs: 1 10*3/uL (ref 0.7–4.0)
MCH: 33 pg (ref 26.0–34.0)
MCHC: 32.5 g/dL (ref 30.0–36.0)
MCV: 101.6 fL — ABNORMAL HIGH (ref 80.0–100.0)
Monocytes Absolute: 0.6 10*3/uL (ref 0.1–1.0)
Monocytes Relative: 12 %
Neutro Abs: 3.1 10*3/uL (ref 1.7–7.7)
Neutrophils Relative %: 64 %
Platelets: 134 10*3/uL — ABNORMAL LOW (ref 150–400)
RBC: 3.73 MIL/uL — ABNORMAL LOW (ref 4.22–5.81)
RDW: 13 % (ref 11.5–15.5)
WBC: 4.8 10*3/uL (ref 4.0–10.5)
nRBC: 0 % (ref 0.0–0.2)

## 2019-05-13 LAB — PSA: Prostatic Specific Antigen: 33.15 ng/mL — ABNORMAL HIGH (ref 0.00–4.00)

## 2019-05-14 NOTE — Progress Notes (Signed)
Patient is coming in for follow up he is doing well, he mentions having some loose stools.

## 2019-05-15 ENCOUNTER — Inpatient Hospital Stay (HOSPITAL_BASED_OUTPATIENT_CLINIC_OR_DEPARTMENT_OTHER): Payer: Medicare HMO | Admitting: Oncology

## 2019-05-15 ENCOUNTER — Encounter: Payer: Self-pay | Admitting: Oncology

## 2019-05-15 ENCOUNTER — Inpatient Hospital Stay: Payer: Medicare HMO

## 2019-05-15 ENCOUNTER — Other Ambulatory Visit: Payer: Self-pay

## 2019-05-15 ENCOUNTER — Inpatient Hospital Stay (HOSPITAL_BASED_OUTPATIENT_CLINIC_OR_DEPARTMENT_OTHER): Payer: Medicare HMO | Admitting: Hospice and Palliative Medicine

## 2019-05-15 VITALS — BP 166/78 | HR 75 | Temp 97.5°F | Resp 18 | Wt 209.9 lb

## 2019-05-15 VITALS — BP 190/93 | HR 78 | Temp 98.0°F | Resp 17

## 2019-05-15 DIAGNOSIS — Z5111 Encounter for antineoplastic chemotherapy: Secondary | ICD-10-CM | POA: Diagnosis not present

## 2019-05-15 DIAGNOSIS — Z7189 Other specified counseling: Secondary | ICD-10-CM | POA: Diagnosis not present

## 2019-05-15 DIAGNOSIS — Z79818 Long term (current) use of other agents affecting estrogen receptors and estrogen levels: Secondary | ICD-10-CM

## 2019-05-15 DIAGNOSIS — C7951 Secondary malignant neoplasm of bone: Secondary | ICD-10-CM | POA: Diagnosis not present

## 2019-05-15 DIAGNOSIS — D539 Nutritional anemia, unspecified: Secondary | ICD-10-CM

## 2019-05-15 DIAGNOSIS — C61 Malignant neoplasm of prostate: Secondary | ICD-10-CM | POA: Insufficient documentation

## 2019-05-15 DIAGNOSIS — R232 Flushing: Secondary | ICD-10-CM

## 2019-05-15 DIAGNOSIS — E538 Deficiency of other specified B group vitamins: Secondary | ICD-10-CM | POA: Diagnosis not present

## 2019-05-15 DIAGNOSIS — Z515 Encounter for palliative care: Secondary | ICD-10-CM

## 2019-05-15 DIAGNOSIS — R972 Elevated prostate specific antigen [PSA]: Secondary | ICD-10-CM

## 2019-05-15 LAB — TESTOSTERONE: Testosterone: 314 ng/dL (ref 264–916)

## 2019-05-15 MED ORDER — VITAMIN B-12 1000 MCG PO TABS: 1000.0000 ug | ORAL_TABLET | Freq: Every day | ORAL | 1 refills | Status: DC

## 2019-05-15 MED ORDER — SODIUM CHLORIDE 0.9 % IV SOLN
Freq: Once | INTRAVENOUS | Status: AC
  Administered 2019-05-15: 10:00:00 via INTRAVENOUS
  Filled 2019-05-15: qty 250

## 2019-05-15 MED ORDER — DEXAMETHASONE SODIUM PHOSPHATE 10 MG/ML IJ SOLN
10.0000 mg | Freq: Once | INTRAMUSCULAR | Status: AC
  Administered 2019-05-15: 10 mg via INTRAVENOUS
  Filled 2019-05-15: qty 1

## 2019-05-15 MED ORDER — SODIUM CHLORIDE 0.9 % IV SOLN
75.0000 mg/m2 | Freq: Once | INTRAVENOUS | Status: AC
  Administered 2019-05-15: 160 mg via INTRAVENOUS
  Filled 2019-05-15: qty 16

## 2019-05-15 MED ORDER — SODIUM CHLORIDE 0.9 % IV SOLN: 10.0000 mg | Freq: Once | INTRAVENOUS | Status: DC

## 2019-05-15 MED ORDER — HYDRALAZINE HCL 20 MG/ML IJ SOLN
5.0000 mg | Freq: Once | INTRAMUSCULAR | Status: AC
  Administered 2019-05-15: 5 mg via INTRAVENOUS

## 2019-05-15 NOTE — Progress Notes (Signed)
@   1017 prior to Docetaxel infusion BP 164/84 HR 70, pt resting comfortably and states that he did not take his BP medications prior to his appointment today. Docetaxel infusion was started at 1023 and vitals were checked every 15 minutes. At 1118 when vitals were checked, BP 184/87 HR 75, pt has no complaints and states "I feel normal" MD was notified of vitals, orders placed by MD to administered hydralazine  5mg  IV if BP rises any higher. At 1130, BP was rechecked- 166/88 HR-76,pt is resting comfortably with no complaints. Orders to continue with treatment. Docetaxel infusion was completed at 1201, VS were checked- BP-188/100. MD notified, orders to give hydralazine dose. Hydralazine 5mg  IV was given per order at 1233. BP rechecked at 1300, BP-190/93 HR 78. Pt states "he feels fine and that he has to leave now and can not wait any longer." RN advised pt to stay so we could get his BP lower and that the MD may want to give him more antihypertensive medications, pt insisted that he has to go and will not wait any longer. RN notified MD that pt refuses to stay any longer in infusion. No new orders given. RN educated the pt on the importance of taking his home BP medications as soon as he gets home and if he has any complications to call [phone] if it is an emergency. Pt verbalized understanding and has no complaints at this time. Pt discharged at 1305 and MD was aware.   Asuncion [facility]scott [employer]

## 2019-05-15 NOTE — Progress Notes (Signed)
Hematology/Oncology Follow up note [facility] Telephone:(336) [phone] [phone]   Patient Care Team: [name], MD as PCP - General (Family Medicine)  REFERRING PROVIDER: [facility] REASON FOR VISIT:  Reestablish care for prostate cancer.  HISTORY OF PRESENTING ILLNESS:  Terry Mitchell is a  68 y.o.  male with PMH listed below who was referred to me for evaluation of elevated PSA. Patient was referred by primary care physician to urology due to elevated PSA at the level of 72.  Per note Patient was referred to urology for further management.  Staging imaging has been ordered including abdominal and pelvis CT with contrast and bone scan.  Patient was also given Mills Koller for treatment of clinical prostate cancer.  Patient reports that he was scheduled to return to urologist office for biopsy. Patient denies any dysuria, hematuria, fever or chills.  He lives by himself. Denies any bone pain.  Reports some soreness at the area of [place] shots, as well as hot flushes.  Otherwise no complaints.  #01/01/2018 Patient received Firmagon loading dose at urologist office  # patient had a prostate biopsy on 02/28/2018 pathology showed prostate adenocarcinoma with androgen deprivation treatment effect.  Adenocarcinoma is present in all core fragments with involvement of 50% or more of each core.  Perineural invasion is present. As patient has received Firmagon prior to biopsy, Gleason grade cannot be reliably assessed after androgen deprivation therapy.  #  discussed with patient's urologist Dr. Bernardo Heater who did patient's prostate biopsy.  He agrees that given Mills Koller prior to biopsy and staging images are not typical.  He agrees that PET scan is consistent with lymph node involvement.  He does not think patient is a good candidate for radical prostatectomy.  He agrees with me about the plan that definitive radiation is a good option.  # September 2019   external Beam radiation. He was scheduled to have I-125 interstitial implant although that can not been done so patient underwent external beam IMRT prostate boost, finished in January 2020.  # patient was on androgen deprivation therapy with Firmagon injections, until August 2020. Declined ADT due to vasomotor symptoms.   #Blood work from 04/14/2019 showed testosterone level of 284, PSA has been decreased to 24.18.  # 04/28/2019 CT chest abdomen pelvis and bone scan images were independently reviewed by me and discussed with patient. Images are consistent with metastatic prostate cancer.  INTERVAL HISTORY Terry Mitchell is a 68 y.o. male who has above history reviewed by me today presents for follow-up visit for management of Stage IVA (cTX, cN1, cM1) castration sensitive metastatic prostate cancer Patient presents for evaluation prior to chemotherapy. Patient denies any new complaints today.  Continue to have intermittent hot flashes.  Chronic fatigue unchanged. Denies fever, chills, nausea, vomiting, diarrhea, chest pain, shortness of breath, abdominal pain, urinary symptoms, lower extremity swelling.       Review of Systems  Constitutional: Positive for fatigue. Negative for appetite change, chills, fever and unexpected weight change.  HENT:   Negative for hearing loss and voice change.   Eyes: Negative for eye problems and icterus.  Respiratory: Negative for chest tightness, cough and shortness of breath.   Cardiovascular: Negative for chest pain and leg swelling.  Gastrointestinal: Negative for abdominal distention and abdominal pain.  Endocrine: Positive for hot flashes.  Genitourinary: Negative for difficulty urinating, dysuria and frequency.   Musculoskeletal: Negative for arthralgias.  Skin: Negative for itching and rash.  Neurological: Negative for light-headedness  and numbness.  Hematological: Negative for adenopathy. Does not bruise/bleed easily.  Psychiatric/Behavioral:  Negative for confusion.     MEDICAL HISTORY:  Past Medical History:  Diagnosis Date   Depression    recently went on disability d/t diagnosis   History of recent fall 01/2018   missed the last step on ladder, causing back discomfort   Hypertension    Prostate cancer ([place]) 12/2017   prostate    SURGICAL HISTORY: Past Surgical History:  Procedure Laterality Date   BACK SURGERY  2001    2 rods and 6 screws in back   Left shoulder surgery Left 1998   removed a piece of bone around collar bone   PROSTATE BIOPSY N/A 02/28/2018   Procedure: PROSTATE BIOPSY;  Surgeon: Abbie Sons, MD;  Location: [facility] ORS;  Service: Urology;  Laterality: N/A;   TRANSRECTAL ULTRASOUND N/A 02/28/2018   Procedure: TRANSRECTAL ULTRASOUND;  Surgeon: Abbie Sons, MD;  Location: [facility] ORS;  Service: Urology;  Laterality: N/A;    SOCIAL HISTORY: Social History   Socioeconomic History   Marital status: Single    Spouse name: Not on file   Number of children: 4   Years of education: Not on file   Highest education level: Not on file  Occupational History   Occupation: disability  Social Designer, fashion/clothing strain: Very hard   Food insecurity    Worry: Sometimes true    Inability: Often true   Transportation needs    Medical: No    Non-medical: Yes  Tobacco Use   Smoking status: Current Every Day Smoker    Packs/day: 2.00    Types: Cigars   Smokeless tobacco: Never Used   Tobacco comment: 2 cigars  Substance and Sexual Activity   Alcohol use: Yes    Alcohol/week: 9.0 standard drinks    Types: 9 Cans of beer per week    Comment: occasional   Drug use: Yes    Types: Cocaine, "Crack" cocaine    Comment: last use about 3 days ago    Sexual activity: Not Currently  Lifestyle   Physical activity    Days per week: 7 days    Minutes per session: 60 min   Stress: Rather much  Relationships   Social connections    Talks on phone: Not on file    Gets  together: Not on file    Attends religious service: Not on file    Active member of club or organization: Not on file    Attends meetings of clubs or organizations: Not on file    Relationship status: Not on file   Intimate partner violence    Fear of current or ex partner: Not on file    Emotionally abused: Not on file    Physically abused: Not on file    Forced sexual activity: Not on file  Other Topics Concern   Not on file  Social History Narrative   Not on file    FAMILY HISTORY: Family History  Problem Relation Age of Onset   Stroke Mother    Kidney failure Mother    Diabetes Maternal Aunt     ALLERGIES:  is allergic to penicillins and lactose intolerance (gi).  MEDICATIONS:  Current Outpatient Medications  Medication Sig Dispense Refill   dexamethasone (DECADRON) 4 MG tablet Take 2 tablets (8 mg total) by mouth 2 (two) times daily. Start the day before Taxotere. Then daily after chemo for 2 days. 30 tablet 1  losartan (COZAAR) 50 MG tablet Take 50 mg by mouth daily after breakfast.   2   ondansetron (ZOFRAN) 8 MG tablet Take 1 tablet (8 mg total) by mouth 2 (two) times daily as needed for refractory nausea / vomiting. 30 tablet 1   prochlorperazine (COMPAZINE) 10 MG tablet Take 1 tablet (10 mg total) by mouth every 6 (six) hours as needed (Nausea or vomiting). 30 tablet 1   venlafaxine (EFFEXOR) 37.5 MG tablet Take 1 tablet (37.5 mg total) by mouth daily. 30 tablet 0   vitamin B-12 (CYANOCOBALAMIN) 1000 MCG tablet Take 1 tablet (1,000 mcg total) by mouth daily. 90 tablet 1   No current facility-administered medications for this visit.      PHYSICAL EXAMINATION: ECOG PERFORMANCE STATUS: 1 - Symptomatic but completely ambulatory Vitals:   05/15/19 0829  BP: (!) 166/78  Pulse: 75  Resp: 18  Temp: (!) 97.5 F (36.4 C)   Filed Weights   05/15/19 0829  Weight: 209 lb 14.4 oz (95.2 kg)    Physical Exam Constitutional:      General: He is not in  acute distress. HENT:     Head: Normocephalic and atraumatic.  Eyes:     General: No scleral icterus.    Conjunctiva/sclera: Conjunctivae normal.     Pupils: Pupils are equal, round, and reactive to light.  Neck:     Musculoskeletal: Normal range of motion and neck supple.  Cardiovascular:     Rate and Rhythm: Normal rate and regular rhythm.     Heart sounds: Normal heart sounds.  Pulmonary:     Effort: Pulmonary effort is normal. No respiratory distress.     Breath sounds: No wheezing.  Abdominal:     General: Bowel sounds are normal. There is no distension.     Palpations: Abdomen is soft. There is no mass.     Tenderness: There is no abdominal tenderness.  Musculoskeletal: Normal range of motion.        General: No deformity.  Lymphadenopathy:     Cervical: No cervical adenopathy.  Skin:    General: Skin is warm and dry.     Findings: No erythema or rash.  Neurological:     Mental Status: He is alert and oriented to person, place, and time.     Cranial Nerves: No cranial nerve deficit.     Coordination: Coordination normal.  Psychiatric:        Behavior: Behavior normal.        Thought Content: Thought content normal.      LABORATORY DATA:  I have reviewed the data as listed Lab Results  Component Value Date   WBC 4.8 05/13/2019   HGB 12.3 (L) 05/13/2019   HCT 37.9 (L) 05/13/2019   MCV 101.6 (H) 05/13/2019   PLT 134 (L) 05/13/2019   Recent Labs    08/22/18 1322 04/14/19 1049 05/13/19 0839  NA 138 138 139  K 4.3 3.7 3.6  CL 104 104 106  CO2 _0 GLUCOSE 106* 107* 135*  BUN 30* 34* 30*  CREATININE 1.51* 1.47* 1.36*  CALCIUM 9.7 8.8* 9.2  GFRNONAA 48* 49* 54*  GFRAA 55* 57* >60  PROT 7.3 7.1 7.3  ALBUMIN 4.1 3.9 3.9  AST _1 ALT _2 ALKPHOS 90 118 105  BILITOT 0.5 0.5 0.6   Iron/TIBC/Ferritin/ %Sat No results found for: IRON, TIBC, FERRITIN, IRONPCTSAT   RADIOGRAPHIC STUDIES: I have personally reviewed the radiological images  as  listed and agreed with the findings in the report. PET scan  Patient had an enlarged left external iliac node measuring 1.4 cm in short axis with maximum SUV 2.5.  Although activity level is relatively low, given the size of the lymph node suspicion is raised for the possibility of metastatic disease. There is also a 7 mm right external iliac lymph node and a 1.2 cm left inguinal lymph node demonstrate only low metabolic activity.  Not overtly enlarged.  [name], faint sclerosis in the right anterior sixth rib.  Neither has significant metabolic activity.  Significance uncertain. Benign-appearing rib fractures with associated low-grade activity along several right anterior ribs include the right ninth rib. Activity in the cecum without associated CT abnormality.  Most likely benign. Ct Abdomen Pelvis Wo Contrast  Result Date: 04/28/2019 CLINICAL DATA:  Prostate cancer restaging. EXAM: CT CHEST, ABDOMEN AND PELVIS WITHOUT CONTRAST TECHNIQUE: Multidetector CT imaging of the chest, abdomen and pelvis was performed following the standard protocol without IV contrast. COMPARISON:  PET-CT from 03/11/2018 FINDINGS: CT CHEST FINDINGS Cardiovascular: Descending thoracic aortic and left anterior descending coronary artery atherosclerotic calcification. Mediastinum/Nodes: Unremarkable Lungs/Pleura: Stable 3 by 4 mm lingular subpleural nodule on image 97/4. Airway thickening is present, suggesting bronchitis or reactive airways disease. Musculoskeletal: Degenerated right AC joint. Wide left AC joint, query prior distal clavicular resection on the right. Degenerative right sternoclavicular arthropathy. Subtle sclerosis anteriorly in the right third rib on image 58/4, not readily appreciable on the prior exam subtle sclerosis posteriorly in the right second rib on image 39/4, not well appreciated on the prior exam. Mild sclerosis inferiorly in the left  lateral third rib. Small amount of lateral cortical thickening of the left lateral sixth rib on image 106/4. There is sclerosis in the left T2 pedicle, image 110/6, not previously hypermetabolic. Sclerosis along the posteroinferior T6 vertebra measuring 1.3 by 1.1 cm on image 95/6, previously there was only about a 3 mm small focus of sclerosis at this location. Additional small foci of sclerosis in several ribs. Increased size of a focus of sclerosis in the T12 vertebral body, 1.3 by 0.9 cm on image 81/6. CT ABDOMEN PELVIS FINDINGS Hepatobiliary: Unremarkable Pancreas: Unremarkable Spleen: Unremarkable Adrenals/Urinary Tract: Both adrenal glands appear normal. 1.8 cm in diameter fluid density lesion of the right kidney upper pole most compatible with a cyst. The kidneys appear otherwise unremarkable. Bladder unremarkable. Stomach/Bowel: Possible mild wall thickening in the rectum. Otherwise unremarkable. Vascular/Lymphatic: Aortoiliac atherosclerotic vascular disease. Left inguinal lymph node 1.2 cm in short axis, previously the same. Left external iliac node 0.9 cm in short axis, formerly 1.4 cm. Reproductive: Punctate calcifications in the prostate gland. Other: No supplemental non-categorized findings. Musculoskeletal: Postoperative findings in the lower lumbar spine. Graft harvesting site from the right iliac bone. Fused what right sacroiliac joint with at least partial bridging of the left sacroiliac joint. Large synovial herniation pit of the right hip. Inferior endplate sclerosis at L3 surrounding a Schmorl's node, image 74/5. IMPRESSION: 1. Enlarging sclerotic focus along the posteroinferior T6 vertebral body, [name]. Some of these were present and not significantly hypermetabolic on the prior PET-CT, but overall the appearance does raise concern for osseous metastatic  disease. 2. Previous left pelvic adenopathy has significantly improved, with the left external iliac node at 0.9 cm in short axis,  formerly 1.4 cm. 3. Other imaging findings of potential clinical significance: Aortic Atherosclerosis (ICD10-I70.0). Coronary atherosclerosis. Airway thickening is present, suggesting bronchitis or reactive airways disease. Possible wall thickening in the rectum, nonspecific. Electronically Signed   By: Van Clines M.D.   On: 04/28/2019 10:42   Ct Chest Wo Contrast  Result Date: 04/28/2019 CLINICAL DATA:  Prostate cancer restaging. EXAM: CT CHEST, ABDOMEN AND PELVIS WITHOUT CONTRAST TECHNIQUE: Multidetector CT imaging of the chest, abdomen and pelvis was performed following the standard protocol without IV contrast. COMPARISON:  PET-CT from 03/11/2018 FINDINGS: CT CHEST FINDINGS Cardiovascular: Descending thoracic aortic and left anterior descending coronary artery atherosclerotic calcification. Mediastinum/Nodes: Unremarkable Lungs/Pleura: Stable 3 by 4 mm lingular subpleural nodule on image 97/4. Airway thickening is present, suggesting bronchitis or reactive airways disease. Musculoskeletal: Degenerated right AC joint. Wide left AC joint, query prior distal clavicular resection on the right. Degenerative right sternoclavicular arthropathy. Subtle sclerosis anteriorly in the right third rib on image 58/4, not readily appreciable on the prior exam subtle sclerosis posteriorly in the right second rib on image 39/4, not well appreciated on the prior exam. Mild sclerosis inferiorly in the left lateral third rib. Small amount of lateral cortical thickening of the left lateral sixth rib on image 106/4. There is sclerosis in the left T2 pedicle, image 110/6, not previously hypermetabolic. Sclerosis along the posteroinferior T6 vertebra measuring 1.3 by 1.1 cm on image 95/6, previously there was only about a 3 mm small focus of sclerosis at this location. Additional small foci of  sclerosis in several ribs. Increased size of a focus of sclerosis in the T12 vertebral body, 1.3 by 0.9 cm on image 81/6. CT ABDOMEN PELVIS FINDINGS Hepatobiliary: Unremarkable Pancreas: Unremarkable Spleen: Unremarkable Adrenals/Urinary Tract: Both adrenal glands appear normal. 1.8 cm in diameter fluid density lesion of the right kidney upper pole most compatible with a cyst. The kidneys appear otherwise unremarkable. Bladder unremarkable. Stomach/Bowel: Possible mild wall thickening in the rectum. Otherwise unremarkable. Vascular/Lymphatic: Aortoiliac atherosclerotic vascular disease. Left inguinal lymph node 1.2 cm in short axis, previously the same. Left external iliac node 0.9 cm in short axis, formerly 1.4 cm. Reproductive: Punctate calcifications in the prostate gland. Other: No supplemental non-categorized findings. Musculoskeletal: Postoperative findings in the lower lumbar spine. Graft harvesting site from the right iliac bone. Fused what right sacroiliac joint with at least partial bridging of the left sacroiliac joint. Large synovial herniation pit of the right hip. Inferior endplate sclerosis at L3 surrounding a Schmorl's node, image 74/5. IMPRESSION: 1. Enlarging sclerotic focus along the posteroinferior T6 vertebral body, [name]. Some of these were present and not significantly hypermetabolic on the prior PET-CT, but overall the appearance does raise concern for osseous metastatic disease. 2. Previous left pelvic adenopathy has significantly improved, with the left external iliac node at 0.9 cm in short axis, formerly 1.4 cm. 3. Other imaging findings of potential clinical significance: Aortic Atherosclerosis (ICD10-I70.0). Coronary atherosclerosis. Airway thickening is present, suggesting bronchitis or reactive airways disease. Possible wall thickening in the  rectum, nonspecific. Electronically Signed   By: Van Clines M.D.   On: 04/28/2019 10:42   Nm Bone Scan Whole Body  Result Date: 04/28/2019 CLINICAL DATA:  Prostate cancer. EXAM: NUCLEAR MEDICINE WHOLE BODY BONE SCAN TECHNIQUE: Whole body anterior and posterior images were obtained approximately 3 hours after intravenous injection of radiopharmaceutical. RADIOPHARMACEUTICALS:  22.5 mCi  Technetium-28mMDP IV COMPARISON:  CT 04/28/2019 FINDINGS: Subtle foci of radiotracer activity within the anterolateral aspect of the LEFT and RIGHT third rib corresponds to sclerotic lesion on comparison CT and consistent with skeletal metastasis. Focal uptake in the T6 vertebral body also corresponds to sclerotic lesion on comparison CT. Focal uptake in the T1 region also corresponds to sclerotic lesion the pedicle on comparison CT. Focal uptake in the medial RIGHT clavicle may be degenerative. Probable degenerative uptake in LEFT acetabulum. IMPRESSION: 1. Focal uptake within the third ribs, T6 and T1 vertebral body correspond to sclerotic lesions on comparison CT and consistent with skeletal metastasis. 2. Probable degenerative uptake in the RIGHT clavicle and LEFT acetabulum. Electronically Signed   By: SSuzy BouchardM.D.   On: 04/28/2019 17:24     ASSESSMENT & PLAN:  1. Prostate cancer metastatic to bone ([place]   2. Goals of care, counseling/discussion   3. Bone metastasis ([place]   4. B12 deficiency   5. Hot flash in male   6. Macrocytic anemia   Cancer Staging Prostate cancer ([facility] Staging form: Prostate, AJCC 8th Edition - Clinical stage from 03/19/2018: Stage IVB (cTX, cN1, cM1b) - Signed by YEarlie Server MD on 05/01/2019  #Metastatic prostate Cancer, castration sensitive PSA 24.18--> 33.15 Labs are reviewed and discussed with the patient. Counts acceptable to proceed chemotherapy with docetaxel. Patient will get G-CSF next Monday-on pro-Neulasta kit not approved by insurance. Recommend patient to  take Claritin for 4 days.  Side effects were discussed with patient at the last visit.  #Macrocytic anemia, vitamin B12 levels were checked and was low. Patient was recommended to start oral vitamin B12 supplementation 1000 MCG daily. Repeat vitamin B12 level in 4 weeks. .. #Chronic kidney disease, creatinine 1.36, avoid nephrotoxins. #Hot flash, Stable.  Continue Effexor 37.5 mg daily.    Orders Placed This Encounter  Procedures   PSA    Standing Status:   Future    Standing Expiration Date:   05/14/2020  Follow-up in 1 week for assessment of chemotherapy toxicities. Follow-up in 3 weeks for next cycle of chemotherapy. Monitor PSA levels every 3 weeks.  ZEarlie Server MD, PhD Hematology Oncology CSt Johns Medical Centerat AThe Hospital Of Central ConnecticutPager- [phone]/23/2020

## 2019-05-15 NOTE — Progress Notes (Signed)
[place]  [phone] [phone]   Name: Terry Mitchell Date: 05/15/2019 MRN: [id number]  DOB: [birth year]  Patient Care Team: [name], MD as PCP - General (Family Medicine)    REASON FOR CONSULTATION: Palliative Care consult requested for this 68 y.o. male with multiple medical problems including stage IV prostate cancer metastatic to bone (diagnosed August 2019) status post XRT and on treatment with chemotherapy.  He was referred to palliative care to help address goals and manage ongoing symptoms.   SOCIAL HISTORY:     reports that he has been smoking cigars. He has been smoking about 2.00 packs per day. He has never used smokeless tobacco. He reports current alcohol use of about 9.0 standard drinks of alcohol per week. He reports current drug use. Drugs: Cocaine and "Crack" cocaine.   Patient lives in a boardinghouse.  He does not drive and transports himself with a scooter.  He has a girlfriend who is available to help with his care as needed.  He has 4 adult children who live around the state of [place].  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  ADVANCE DIRECTIVES:  Does not have  CODE STATUS:   PAST MEDICAL HISTORY: Past Medical History:  Diagnosis Date  . Depression    recently went on disability d/t diagnosis  . History of recent fall 01/2018   missed the last step on ladder, causing back discomfort  . Hypertension   . Prostate cancer ([place]) 12/2017   prostate    PAST SURGICAL HISTORY:  Past Surgical History:  Procedure Laterality Date  . BACK SURGERY  2001    2 rods and 6 screws in back  . Left shoulder surgery Left 1998   removed a piece of bone around collar bone  . PROSTATE BIOPSY N/A 02/28/2018   Procedure: PROSTATE BIOPSY;  Surgeon: Abbie Sons, MD;  Location: [facility] ORS;  Service: Urology;  Laterality: N/A;  . TRANSRECTAL ULTRASOUND N/A  02/28/2018   Procedure: TRANSRECTAL ULTRASOUND;  Surgeon: Abbie Sons, MD;  Location: [facility] ORS;  Service: Urology;  Laterality: N/A;    HEMATOLOGY/ONCOLOGY HISTORY:  Oncology History  Prostate cancer ([place])  03/19/2018 Initial Diagnosis   Prostate cancer ([place])   03/19/2018 Cancer Staging   Staging form: Prostate, AJCC 8th Edition - Clinical stage from 03/19/2018: Stage IVB (cTX, cN1, cM1b) - Signed by [name], MD on 05/01/2019   05/15/2019 -  Chemotherapy   The patient had pegfilgrastim-jmdb (FULPHILA) injection 6 mg, 6 mg, Subcutaneous,  Once, 1 of 4 cycles DOCEtaxel (TAXOTERE) 160 mg in sodium chloride 0.9 % 250 mL chemo infusion, 75 mg/m2 = 160 mg, Intravenous,  Once, 1 of 4 cycles  for chemotherapy treatment.      ALLERGIES:  is allergic to penicillins and lactose intolerance (gi).  MEDICATIONS:  Current Outpatient Medications  Medication Sig Dispense Refill  . dexamethasone (DECADRON) 4 MG tablet Take 2 tablets (8 mg total) by mouth 2 (two) times daily. Start the day before Taxotere. Then daily after chemo for 2 days. 30 tablet 1  . losartan (COZAAR) 50 MG tablet Take 50 mg by mouth daily after breakfast.   2  . ondansetron (ZOFRAN) 8 MG tablet Take 1 tablet (8 mg total) by mouth 2 (two) times daily as needed for refractory nausea / vomiting. 30 tablet 1  . prochlorperazine (COMPAZINE) 10 MG tablet Take 1 tablet (10 mg total) by mouth  every 6 (six) hours as needed (Nausea or vomiting). 30 tablet 1  . venlafaxine (EFFEXOR) 37.5 MG tablet Take 1 tablet (37.5 mg total) by mouth daily. 30 tablet 0  . vitamin B-12 (CYANOCOBALAMIN) 1000 MCG tablet Take 1 tablet (1,000 mcg total) by mouth daily. 90 tablet 1   No current facility-administered medications for this visit.    Facility-Administered Medications Ordered in Other Visits  Medication Dose Route Frequency Provider Last Rate Last Dose  . 0.9 %  sodium chloride infusion   Intravenous Once [name], MD      . dexamethasone  (DECADRON) injection 10 mg  10 mg Intravenous Once [name], MD      . DOCEtaxel (TAXOTERE) 160 mg in sodium chloride 0.9 % 250 mL chemo infusion  75 mg/m2 (Treatment Plan Recorded) Intravenous Once [name], MD        VITAL SIGNS: There were no vitals taken for this visit. There were no vitals filed for this visit.  Estimated body mass index is 28.47 kg/m as calculated from the following:   Height as of 03/08/19: 6' (1.829 m).   Weight as of an earlier encounter on 05/15/19: 209 lb 14.4 oz (95.2 kg).  LABS: CBC:    Component Value Date/Time   WBC 4.8 05/13/2019 0839   HGB 12.3 (L) 05/13/2019 0839   HCT 37.9 (L) 05/13/2019 0839   PLT 134 (L) 05/13/2019 0839   MCV 101.6 (H) 05/13/2019 0839   NEUTROABS 3.1 05/13/2019 0839   LYMPHSABS 1.0 05/13/2019 0839   MONOABS 0.6 05/13/2019 0839   EOSABS 0.2 05/13/2019 0839   BASOSABS 0.0 05/13/2019 0839   Comprehensive Metabolic Panel:    Component Value Date/Time   NA 139 05/13/2019 0839   K 3.6 05/13/2019 0839   CL 106 05/13/2019 0839   CO2 24 05/13/2019 0839   BUN 30 (H) 05/13/2019 0839   BUN 19 01/01/2018 1509   CREATININE 1.36 (H) 05/13/2019 0839   GLUCOSE 135 (H) 05/13/2019 0839   CALCIUM 9.2 05/13/2019 0839   AST 26 05/13/2019 0839   ALT 15 05/13/2019 0839   ALKPHOS 105 05/13/2019 0839   BILITOT 0.6 05/13/2019 0839   PROT 7.3 05/13/2019 0839   ALBUMIN 3.9 05/13/2019 0839    RADIOGRAPHIC STUDIES: Ct Abdomen Pelvis Wo Contrast  Result Date: 04/28/2019 CLINICAL DATA:  Prostate cancer restaging. EXAM: CT CHEST, ABDOMEN AND PELVIS WITHOUT CONTRAST TECHNIQUE: Multidetector CT imaging of the chest, abdomen and pelvis was performed following the standard protocol without IV contrast. COMPARISON:  PET-CT from 03/11/2018 FINDINGS: CT CHEST FINDINGS Cardiovascular: Descending thoracic aortic and left anterior descending coronary artery atherosclerotic calcification. Mediastinum/Nodes: Unremarkable Lungs/Pleura: Stable 3 by 4 mm  lingular subpleural nodule on image 97/4. Airway thickening is present, suggesting bronchitis or reactive airways disease. Musculoskeletal: Degenerated right AC joint. Wide left AC joint, query prior distal clavicular resection on the right. Degenerative right sternoclavicular arthropathy. Subtle sclerosis anteriorly in the right third rib on image 58/4, not readily appreciable on the prior exam subtle sclerosis posteriorly in the right second rib on image 39/4, not well appreciated on the prior exam. Mild sclerosis inferiorly in the left lateral third rib. Small amount of lateral cortical thickening of the left lateral sixth rib on image 106/4. There is sclerosis in the left T2 pedicle, image 110/6, not previously hypermetabolic. Sclerosis along the posteroinferior T6 vertebra measuring 1.3 by 1.1 cm on image 95/6, previously there was only about a 3 mm small focus of sclerosis at this location.  Additional small foci of sclerosis in several ribs. Increased size of a focus of sclerosis in the T12 vertebral body, 1.3 by 0.9 cm on image 81/6. CT ABDOMEN PELVIS FINDINGS Hepatobiliary: Unremarkable Pancreas: Unremarkable Spleen: Unremarkable Adrenals/Urinary Tract: Both adrenal glands appear normal. 1.8 cm in diameter fluid density lesion of the right kidney upper pole most compatible with a cyst. The kidneys appear otherwise unremarkable. Bladder unremarkable. Stomach/Bowel: Possible mild wall thickening in the rectum. Otherwise unremarkable. Vascular/Lymphatic: Aortoiliac atherosclerotic vascular disease. Left inguinal lymph node 1.2 cm in short axis, previously the same. Left external iliac node 0.9 cm in short axis, formerly 1.4 cm. Reproductive: Punctate calcifications in the prostate gland. Other: No supplemental non-categorized findings. Musculoskeletal: Postoperative findings in the lower lumbar spine. Graft harvesting site from the right iliac bone. Fused what right sacroiliac joint with at least partial  bridging of the left sacroiliac joint. Large synovial herniation pit of the right hip. Inferior endplate sclerosis at L3 surrounding a Schmorl's node, image 74/5. IMPRESSION: 1. Enlarging sclerotic focus along the posteroinferior T6 vertebral body, [name]. Some of these were present and not significantly hypermetabolic on the prior PET-CT, but overall the appearance does raise concern for osseous metastatic disease. 2. Previous left pelvic adenopathy has significantly improved, with the left external iliac node at 0.9 cm in short axis, formerly 1.4 cm. 3. Other imaging findings of potential clinical significance: Aortic Atherosclerosis (ICD10-I70.0). Coronary atherosclerosis. Airway thickening is present, suggesting bronchitis or reactive airways disease. Possible wall thickening in the rectum, nonspecific. Electronically Signed   By: Van Clines M.D.   On: 04/28/2019 10:42   Ct Chest Wo Contrast  Result Date: 04/28/2019 CLINICAL DATA:  Prostate cancer restaging. EXAM: CT CHEST, ABDOMEN AND PELVIS WITHOUT CONTRAST TECHNIQUE: Multidetector CT imaging of the chest, abdomen and pelvis was performed following the standard protocol without IV contrast. COMPARISON:  PET-CT from 03/11/2018 FINDINGS: CT CHEST FINDINGS Cardiovascular: Descending thoracic aortic and left anterior descending coronary artery atherosclerotic calcification. Mediastinum/Nodes: Unremarkable Lungs/Pleura: Stable 3 by 4 mm lingular subpleural nodule on image 97/4. Airway thickening is present, suggesting bronchitis or reactive airways disease. Musculoskeletal: Degenerated right AC joint. Wide left AC joint, query prior distal clavicular resection on the right. Degenerative right sternoclavicular arthropathy. Subtle sclerosis anteriorly in the right third rib on image 58/4, not readily appreciable on  the prior exam subtle sclerosis posteriorly in the right second rib on image 39/4, not well appreciated on the prior exam. Mild sclerosis inferiorly in the left lateral third rib. Small amount of lateral cortical thickening of the left lateral sixth rib on image 106/4. There is sclerosis in the left T2 pedicle, image 110/6, not previously hypermetabolic. Sclerosis along the posteroinferior T6 vertebra measuring 1.3 by 1.1 cm on image 95/6, previously there was only about a 3 mm small focus of sclerosis at this location. Additional small foci of sclerosis in several ribs. Increased size of a focus of sclerosis in the T12 vertebral body, 1.3 by 0.9 cm on image 81/6. CT ABDOMEN PELVIS FINDINGS Hepatobiliary: Unremarkable Pancreas: Unremarkable Spleen: Unremarkable Adrenals/Urinary Tract: Both adrenal glands appear normal. 1.8 cm in diameter fluid density lesion of the right kidney upper pole most compatible with a cyst. The kidneys appear otherwise unremarkable. Bladder unremarkable. Stomach/Bowel: Possible mild wall thickening in the rectum. Otherwise unremarkable. Vascular/Lymphatic: Aortoiliac atherosclerotic vascular disease. Left inguinal lymph node 1.2 cm in  short axis, previously the same. Left external iliac node 0.9 cm in short axis, formerly 1.4 cm. Reproductive: Punctate calcifications in the prostate gland. Other: No supplemental non-categorized findings. Musculoskeletal: Postoperative findings in the lower lumbar spine. Graft harvesting site from the right iliac bone. Fused what right sacroiliac joint with at least partial bridging of the left sacroiliac joint. Large synovial herniation pit of the right hip. Inferior endplate sclerosis at L3 surrounding a Schmorl's node, image 74/5. IMPRESSION: 1. Enlarging sclerotic focus along the posteroinferior T6 vertebral body, and enlarging sclerotic focus in the T12 vertebral body. Small bilateral rib sclerotic foci and a sclerotic lesion in the left T2 vertebral  pedicle. Some of these were present and not significantly hypermetabolic on the prior PET-CT, but overall the appearance does raise concern for osseous metastatic disease. 2. Previous left pelvic adenopathy has significantly improved, with the left external iliac node at 0.9 cm in short axis, formerly 1.4 cm. 3. Other imaging findings of potential clinical significance: Aortic Atherosclerosis (ICD10-I70.0). Coronary atherosclerosis. Airway thickening is present, suggesting bronchitis or reactive airways disease. Possible wall thickening in the rectum, nonspecific. Electronically Signed   By: Van Clines M.D.   On: 04/28/2019 10:42   Nm Bone Scan Whole Body  Result Date: 04/28/2019 CLINICAL DATA:  Prostate cancer. EXAM: NUCLEAR MEDICINE WHOLE BODY BONE SCAN TECHNIQUE: Whole body anterior and posterior images were obtained approximately 3 hours after intravenous injection of radiopharmaceutical. RADIOPHARMACEUTICALS:  22.5 mCi Technetium-4mMDP IV COMPARISON:  CT 04/28/2019 FINDINGS: Subtle foci of radiotracer activity within the anterolateral aspect of the LEFT and RIGHT third rib corresponds to sclerotic lesion on comparison CT and consistent with skeletal metastasis. Focal uptake in the T6 vertebral body also corresponds to sclerotic lesion on comparison CT. Focal uptake in the T1 region also corresponds to sclerotic lesion the pedicle on comparison CT. Focal uptake in the medial RIGHT clavicle may be degenerative. Probable degenerative uptake in LEFT acetabulum. IMPRESSION: 1. Focal uptake within the third ribs, T6 and T1 vertebral body correspond to sclerotic lesions on comparison CT and consistent with skeletal metastasis. 2. Probable degenerative uptake in the RIGHT clavicle and LEFT acetabulum. Electronically Signed   By: SSuzy BouchardM.D.   On: 04/28/2019 17:24    PERFORMANCE STATUS (ECOG) : 1 - Symptomatic but completely ambulatory  Review of Systems Unless otherwise noted, a complete  review of systems is negative.  Physical Exam General: NAD, frail appearing, thin Pulmonary: Unlabored Extremities: no edema, no joint deformities Skin: no rashes Neurological: Weakness but otherwise nonfocal  IMPRESSION: I met with patient today in the clinic.  Introduced palliative care services and attempted to establish therapeutic rapport.  Patient says that he recognizes that his cancer is metastatic and incurable.  He says that he hopes that treatment will slow the progression and prolong his life.  However, patient says that he places his faith in God who will ultimately decide when it is his time.  Patient tells me that he has had friends that have been otherwise healthy and then just died suddenly and says "you never know when it is your time."  Patient is functionally independent with his care.  He resides in a boardinghouse and does not have a license.  He does own a scooter but relies on his girlfriend for transportation when she is able.  We will plan to establish transportation through our [facility]  Symptomatically, patient reports that he is doing reasonably well.  He denies any distressing symptoms today.  He reports pain is generally well controlled.  He reports good appetite.  He has occasional fatigue that is resolved with rest.  We will need to discuss ACP documents and hopefully complete a MOST Form during a future visit.  PLAN: -Continue current scope of treatment -Establish transportation through [facility] -We will plan to review ACP documents during future visit -RTC in 2 to 3 weeks   Patient expressed understanding and was in agreement with this plan. He also understands that He can call the clinic at any time with any questions, concerns, or complaints.     Time Total: 30 minutes  Visit consisted of counseling and education dealing with the complex and emotionally intense issues of symptom management and palliative care in the setting of serious and  potentially life-threatening illness.Greater than 50%  of this time was spent counseling and coordinating care related to the above assessment and plan.  Signed by: Altha Harm, PhD, NP-C [phone] (Work Cell)

## 2019-05-18 ENCOUNTER — Inpatient Hospital Stay: Payer: Medicare HMO

## 2019-05-18 ENCOUNTER — Telehealth: Payer: Self-pay

## 2019-05-18 ENCOUNTER — Other Ambulatory Visit: Payer: Self-pay

## 2019-05-18 DIAGNOSIS — Z5111 Encounter for antineoplastic chemotherapy: Secondary | ICD-10-CM | POA: Diagnosis not present

## 2019-05-18 DIAGNOSIS — C61 Malignant neoplasm of prostate: Secondary | ICD-10-CM

## 2019-05-18 MED ORDER — PEGFILGRASTIM-JMDB 6 MG/0.6ML ~~LOC~~ SOSY
6.0000 mg | PREFILLED_SYRINGE | Freq: Once | SUBCUTANEOUS | Status: AC
  Administered 2019-05-18: 6 mg via SUBCUTANEOUS
  Filled 2019-05-18: qty 0.6

## 2019-05-18 NOTE — Telephone Encounter (Signed)
Telephone call to patient for follow up after receiving first infusion on Friday.   Patient states feeling great.  States after he receive his chemo on Friday he went home and cut the grass.  Denies having any nausea  Or any complaints.  Eating and drinking well.  Encouraged patient to call for any questions or concerns.  Patient thanked me for calling to check on him.

## 2019-05-21 ENCOUNTER — Other Ambulatory Visit: Payer: Self-pay

## 2019-05-21 ENCOUNTER — Encounter: Payer: Self-pay | Admitting: Oncology

## 2019-05-21 NOTE — Progress Notes (Signed)
Patient prescreened for appointment. Patient has no concerns or questions.  

## 2019-05-22 ENCOUNTER — Other Ambulatory Visit: Payer: Self-pay

## 2019-05-22 ENCOUNTER — Encounter: Payer: Self-pay | Admitting: Oncology

## 2019-05-22 ENCOUNTER — Other Ambulatory Visit: Payer: Medicare HMO

## 2019-05-22 ENCOUNTER — Inpatient Hospital Stay: Payer: Medicare HMO

## 2019-05-22 ENCOUNTER — Inpatient Hospital Stay (HOSPITAL_BASED_OUTPATIENT_CLINIC_OR_DEPARTMENT_OTHER): Payer: Medicare HMO | Admitting: Oncology

## 2019-05-22 ENCOUNTER — Ambulatory Visit: Payer: Medicare HMO | Admitting: Oncology

## 2019-05-22 VITALS — BP 129/79 | HR 77 | Temp 96.9°F | Resp 18 | Wt 205.4 lb

## 2019-05-22 DIAGNOSIS — N1832 Chronic kidney disease, stage 3b: Secondary | ICD-10-CM

## 2019-05-22 DIAGNOSIS — R232 Flushing: Secondary | ICD-10-CM

## 2019-05-22 DIAGNOSIS — Z5111 Encounter for antineoplastic chemotherapy: Secondary | ICD-10-CM | POA: Diagnosis not present

## 2019-05-22 DIAGNOSIS — E538 Deficiency of other specified B group vitamins: Secondary | ICD-10-CM | POA: Diagnosis not present

## 2019-05-22 DIAGNOSIS — C61 Malignant neoplasm of prostate: Secondary | ICD-10-CM

## 2019-05-22 DIAGNOSIS — C7951 Secondary malignant neoplasm of bone: Secondary | ICD-10-CM

## 2019-05-22 DIAGNOSIS — D696 Thrombocytopenia, unspecified: Secondary | ICD-10-CM

## 2019-05-22 DIAGNOSIS — D539 Nutritional anemia, unspecified: Secondary | ICD-10-CM

## 2019-05-22 LAB — CBC WITH DIFFERENTIAL/PLATELET
Abs Immature Granulocytes: 0.47 10*3/uL — ABNORMAL HIGH (ref 0.00–0.07)
Basophils Absolute: 0 10*3/uL (ref 0.0–0.1)
Basophils Relative: 1 %
Eosinophils Absolute: 0.1 10*3/uL (ref 0.0–0.5)
Eosinophils Relative: 3 %
HCT: 35.6 % — ABNORMAL LOW (ref 39.0–52.0)
Hemoglobin: 11.6 g/dL — ABNORMAL LOW (ref 13.0–17.0)
Immature Granulocytes: 21 %
Lymphocytes Relative: 31 %
Lymphs Abs: 0.7 10*3/uL (ref 0.7–4.0)
MCH: 32.6 pg (ref 26.0–34.0)
MCHC: 32.6 g/dL (ref 30.0–36.0)
MCV: 100 fL (ref 80.0–100.0)
Monocytes Absolute: 0.8 10*3/uL (ref 0.1–1.0)
Monocytes Relative: 39 %
Neutro Abs: 0.1 10*3/uL — ABNORMAL LOW (ref 1.7–7.7)
Neutrophils Relative %: 5 %
Platelets: 122 10*3/uL — ABNORMAL LOW (ref 150–400)
RBC: 3.56 MIL/uL — ABNORMAL LOW (ref 4.22–5.81)
RDW: 12.9 % (ref 11.5–15.5)
Smear Review: NORMAL
WBC: 2.2 10*3/uL — ABNORMAL LOW (ref 4.0–10.5)
nRBC: 0 % (ref 0.0–0.2)

## 2019-05-22 LAB — COMPREHENSIVE METABOLIC PANEL
ALT: 15 U/L (ref 0–44)
AST: 20 U/L (ref 15–41)
Albumin: 3.6 g/dL (ref 3.5–5.0)
Alkaline Phosphatase: 73 U/L (ref 38–126)
Anion gap: 8 (ref 5–15)
BUN: 26 mg/dL — ABNORMAL HIGH (ref 8–23)
CO2: 25 mmol/L (ref 22–32)
Calcium: 8.9 mg/dL (ref 8.9–10.3)
Chloride: 105 mmol/L (ref 98–111)
Creatinine, Ser: 1.54 mg/dL — ABNORMAL HIGH (ref 0.61–1.24)
GFR calc Af Amer: 54 mL/min — ABNORMAL LOW (ref >60)
GFR calc non Af Amer: 46 mL/min — ABNORMAL LOW (ref >60)
Glucose, Bld: 103 mg/dL — ABNORMAL HIGH (ref 70–99)
Potassium: 4 mmol/L (ref 3.5–5.1)
Sodium: 138 mmol/L (ref 135–145)
Total Bilirubin: 0.6 mg/dL (ref 0.3–1.2)
Total Protein: 6.6 g/dL (ref 6.5–8.1)

## 2019-05-22 MED ORDER — LEVOFLOXACIN 500 MG PO TABS: 500.0000 mg | ORAL_TABLET | Freq: Every day | ORAL | 0 refills | Status: DC

## 2019-05-22 MED ORDER — ALBUTEROL SULFATE HFA 108 (90 BASE) MCG/ACT IN AERS: 2.0000 | INHALATION_SPRAY | Freq: Four times a day (QID) | RESPIRATORY_TRACT | 0 refills | Status: DC | PRN

## 2019-05-22 NOTE — Progress Notes (Signed)
This morning patient started having mid chest pain that has now moved to low back and side pain, 7/10.

## 2019-05-25 NOTE — Progress Notes (Signed)
Hematology/Oncology Follow up note [facility] Telephone:([phone]) [phone] [phone]   Patient Care Team: [name], MD as PCP - General (Family Medicine)  REFERRING PROVIDER: [facility] REASON FOR VISIT:  Reestablish care for prostate cancer.  HISTORY OF PRESENTING ILLNESS:  Terry Mitchell is a  68 y.o.  male with PMH listed below who was referred to me for evaluation of elevated PSA. Patient was referred by primary care physician to urology due to elevated PSA at the level of 72.  Per note Patient was referred to urology for further management.  Staging imaging has been ordered including abdominal and pelvis CT with contrast and bone scan.  Patient was also given Mills Koller for treatment of clinical prostate cancer.  Patient reports that he was scheduled to return to urologist office for biopsy. Patient denies any dysuria, hematuria, fever or chills.  He lives by himself. Denies any bone pain.  Reports some soreness at the area of [place] shots, as well as hot flushes.  Otherwise no complaints.  #01/01/2018 Patient received Firmagon loading dose at urologist office  # patient had a prostate biopsy on 02/28/2018 pathology showed prostate adenocarcinoma with androgen deprivation treatment effect.  Adenocarcinoma is present in all core fragments with involvement of 50% or more of each core.  Perineural invasion is present. As patient has received Firmagon prior to biopsy, Gleason grade cannot be reliably assessed after androgen deprivation therapy.  #  discussed with patient's urologist Dr. Bernardo Heater who did patient's prostate biopsy.  He agrees that given Mills Koller prior to biopsy and staging images are not typical.  He agrees that PET scan is consistent with lymph node involvement.  He does not think patient is a good candidate for radical prostatectomy.  He agrees with me about the plan that definitive radiation is a good option.  # September 2019   external Beam radiation. He was scheduled to have I-125 interstitial implant although that can not been done so patient underwent external beam IMRT prostate boost, finished in January 2020.  # patient was on androgen deprivation therapy with Firmagon injections, until August 2020. Declined ADT due to vasomotor symptoms.   #Blood work from 04/14/2019 showed testosterone level of 284, PSA has been decreased to 24.18.  # 04/28/2019 CT chest abdomen pelvis and bone scan images were independently reviewed by me and discussed with patient. Images are consistent with metastatic prostate cancer.  INTERVAL HISTORY Terry Mitchell is a 68 y.o. male who has above history reviewed by me today presents for follow-up visit for management of Stage IVA (cTX, cN1, cM1) castration sensitive metastatic prostate cancer Patient presents for evaluation after first cycle of docetaxel. Patient received growth factor on 05/18/2019. Patient reports feeling tired and fatigued. Denies any fever, chills, vomiting, diarrhea. Patient denies any new complaints today.  Continue to have intermittent hot flashes.  Chronic fatigue unchanged. Denies fever, chills, nausea, vomiting, diarrhea, chest pain, shortness of breath, abdominal pain, urinary symptoms, lower extremity swelling.  Patient had some mid chest pain this morning now moved to lower back and side pain.  7 out of 10.  No exacerbating or alleviating factors. Denies anyShortness of breath      Review of Systems  Constitutional: Positive for fatigue. Negative for appetite change, chills, fever and unexpected weight change.  HENT:   Negative for hearing loss and voice change.   Eyes: Negative for eye problems and icterus.  Respiratory: Negative for chest tightness, cough and shortness of breath.  Cardiovascular: Positive for chest pain. Negative for leg swelling.  Gastrointestinal: Negative for abdominal distention and abdominal pain.  Endocrine: Positive for hot  flashes.  Genitourinary: Negative for difficulty urinating, dysuria and frequency.   Musculoskeletal: Negative for arthralgias.  Skin: Negative for itching and rash.  Neurological: Negative for light-headedness and numbness.  Hematological: Negative for adenopathy. Does not bruise/bleed easily.  Psychiatric/Behavioral: Negative for confusion.     MEDICAL HISTORY:  Past Medical History:  Diagnosis Date   Depression    recently went on disability d/t diagnosis   History of recent fall 01/2018   missed the last step on ladder, causing back discomfort   Hypertension    Prostate cancer ([place]) 12/2017   prostate    SURGICAL HISTORY: Past Surgical History:  Procedure Laterality Date   BACK SURGERY  2001    2 rods and 6 screws in back   Left shoulder surgery Left 1998   removed a piece of bone around collar bone   PROSTATE BIOPSY N/A 02/28/2018   Procedure: PROSTATE BIOPSY;  Surgeon: Abbie Sons, MD;  Location: [facility] ORS;  Service: Urology;  Laterality: N/A;   TRANSRECTAL ULTRASOUND N/A 02/28/2018   Procedure: TRANSRECTAL ULTRASOUND;  Surgeon: Abbie Sons, MD;  Location: [facility] ORS;  Service: Urology;  Laterality: N/A;    SOCIAL HISTORY: Social History   Socioeconomic History   Marital status: Single    Spouse name: Not on file   Number of children: 4   Years of education: Not on file   Highest education level: Not on file  Occupational History   Occupation: disability  Social Designer, fashion/clothing strain: Very hard   Food insecurity    Worry: Sometimes true    Inability: Often true   Transportation needs    Medical: No    Non-medical: Yes  Tobacco Use   Smoking status: Current Every Day Smoker    Packs/day: 2.00    Types: Cigars   Smokeless tobacco: Never Used   Tobacco comment: 2 cigars  Substance and Sexual Activity   Alcohol use: Yes    Alcohol/week: 9.0 standard drinks    Types: 9 Cans of beer per week    Comment: occasional    Drug use: Yes    Types: Cocaine, "Crack" cocaine    Comment: last use about 3 days ago    Sexual activity: Not Currently  Lifestyle   Physical activity    Days per week: 7 days    Minutes per session: 60 min   Stress: Rather much  Relationships   Social connections    Talks on phone: Not on file    Gets together: Not on file    Attends religious service: Not on file    Active member of club or organization: Not on file    Attends meetings of clubs or organizations: Not on file    Relationship status: Not on file   Intimate partner violence    Fear of current or ex partner: Not on file    Emotionally abused: Not on file    Physically abused: Not on file    Forced sexual activity: Not on file  Other Topics Concern   Not on file  Social History Narrative   Not on file    FAMILY HISTORY: Family History  Problem Relation Age of Onset   Stroke Mother    Kidney failure Mother    Diabetes Maternal Aunt     ALLERGIES:  is allergic to  penicillins and lactose intolerance (gi).  MEDICATIONS:  Current Outpatient Medications  Medication Sig Dispense Refill   dexamethasone (DECADRON) 4 MG tablet Take 2 tablets (8 mg total) by mouth 2 (two) times daily. Start the day before Taxotere. Then daily after chemo for 2 days. 30 tablet 1   losartan (COZAAR) 50 MG tablet Take 50 mg by mouth daily after breakfast.   2   ondansetron (ZOFRAN) 8 MG tablet Take 1 tablet (8 mg total) by mouth 2 (two) times daily as needed for refractory nausea / vomiting. 30 tablet 1   prochlorperazine (COMPAZINE) 10 MG tablet Take 1 tablet (10 mg total) by mouth every 6 (six) hours as needed (Nausea or vomiting). 30 tablet 1   venlafaxine (EFFEXOR) 37.5 MG tablet Take 1 tablet (37.5 mg total) by mouth daily. 30 tablet 0   vitamin B-12 (CYANOCOBALAMIN) 1000 MCG tablet Take 1 tablet (1,000 mcg total) by mouth daily. 90 tablet 1   albuterol (VENTOLIN HFA) 108 (90 Base) MCG/ACT inhaler Inhale 2 puffs  into the lungs every 6 (six) hours as needed for wheezing or shortness of breath. 6.7 g 0   levofloxacin (LEVAQUIN) 500 MG tablet Take 1 tablet (500 mg total) by mouth daily. 7 tablet 0   No current facility-administered medications for this visit.      PHYSICAL EXAMINATION: ECOG PERFORMANCE STATUS: 1 - Symptomatic but completely ambulatory Vitals:   05/22/19 1429  BP: 129/79  Pulse: 77  Resp: 18  Temp: (!) 96.9 F (36.1 C)   Filed Weights   05/22/19 1429  Weight: 205 lb 6.4 oz (93.2 kg)    Physical Exam Constitutional:      General: He is not in acute distress. HENT:     Head: Normocephalic and atraumatic.  Eyes:     General: No scleral icterus.    Conjunctiva/sclera: Conjunctivae normal.     Pupils: Pupils are equal, round, and reactive to light.  Neck:     Musculoskeletal: Normal range of motion and neck supple.  Cardiovascular:     Rate and Rhythm: Normal rate and regular rhythm.     Heart sounds: Normal heart sounds.  Pulmonary:     Effort: Pulmonary effort is normal. No respiratory distress.     Breath sounds: No wheezing.  Abdominal:     General: Bowel sounds are normal. There is no distension.     Palpations: Abdomen is soft. There is no mass.     Tenderness: There is no abdominal tenderness.  Musculoskeletal: Normal range of motion.        General: No deformity.  Lymphadenopathy:     Cervical: No cervical adenopathy.  Skin:    General: Skin is warm and dry.     Findings: No erythema or rash.  Neurological:     Mental Status: He is alert and oriented to person, place, and time.     Cranial Nerves: No cranial nerve deficit.     Coordination: Coordination normal.  Psychiatric:        Behavior: Behavior normal.        Thought Content: Thought content normal.      LABORATORY DATA:  I have reviewed the data as listed Lab Results  Component Value Date   WBC 2.2 (L) 05/22/2019   HGB 11.6 (L) 05/22/2019   HCT 35.6 (L) 05/22/2019   MCV 100.0  05/22/2019   PLT 122 (L) 05/22/2019   Recent Labs    04/14/19 1049 05/13/19 0839 05/22/19 1324  NA 138  139 138  K 3.7 3.6 4.0  CL 104 106 105  CO2 25 24 25   GLUCOSE 107* 135* 103*  BUN 34* 30* 26*  CREATININE 1.47* 1.36* 1.54*  CALCIUM 8.8* 9.2 8.9  GFRNONAA 49* 54* 46*  GFRAA 57* >60 54*  PROT 7.1 7.3 6.6  ALBUMIN 3.9 3.9 3.6  AST 28 26 20   ALT 14 15 15   ALKPHOS 118 105 73  BILITOT 0.5 0.6 0.6   Iron/TIBC/Ferritin/ %Sat No results found for: IRON, TIBC, FERRITIN, IRONPCTSAT   RADIOGRAPHIC STUDIES: I have personally reviewed the radiological images as listed and agreed with the findings in the report. PET scan  Patient had an enlarged left external iliac node measuring 1.4 cm in short axis with maximum SUV 2.5.  Although activity level is relatively low, given the size of the lymph node suspicion is raised for the possibility of metastatic disease. There is also a 7 mm right external iliac lymph node and a 1.2 cm left inguinal lymph node demonstrate only low metabolic activity.  Not overtly enlarged.  [name], faint sclerosis in the right anterior sixth rib.  Neither has significant metabolic activity.  Significance uncertain. Benign-appearing rib fractures with associated low-grade activity along several right anterior ribs include the right ninth rib. Activity in the cecum without associated CT abnormality.  Most likely benign. Ct Abdomen Pelvis Wo Contrast  Result Date: 04/28/2019 CLINICAL DATA:  Prostate cancer restaging. EXAM: CT CHEST, ABDOMEN AND PELVIS WITHOUT CONTRAST TECHNIQUE: Multidetector CT imaging of the chest, abdomen and pelvis was performed following the standard protocol without IV contrast. COMPARISON:  PET-CT from 03/11/2018 FINDINGS: CT CHEST FINDINGS Cardiovascular: Descending thoracic aortic and left anterior descending coronary artery atherosclerotic calcification. Mediastinum/Nodes:  Unremarkable Lungs/Pleura: Stable 3 by 4 mm lingular subpleural nodule on image 97/4. Airway thickening is present, suggesting bronchitis or reactive airways disease. Musculoskeletal: Degenerated right AC joint. Wide left AC joint, query prior distal clavicular resection on the right. Degenerative right sternoclavicular arthropathy. Subtle sclerosis anteriorly in the right third rib on image 58/4, not readily appreciable on the prior exam subtle sclerosis posteriorly in the right second rib on image 39/4, not well appreciated on the prior exam. Mild sclerosis inferiorly in the left lateral third rib. Small amount of lateral cortical thickening of the left lateral sixth rib on image 106/4. There is sclerosis in the left T2 pedicle, image 110/6, not previously hypermetabolic. Sclerosis along the posteroinferior T6 vertebra measuring 1.3 by 1.1 cm on image 95/6, previously there was only about a 3 mm small focus of sclerosis at this location. Additional small foci of sclerosis in several ribs. Increased size of a focus of sclerosis in the T12 vertebral body, 1.3 by 0.9 cm on image 81/6. CT ABDOMEN PELVIS FINDINGS Hepatobiliary: Unremarkable Pancreas: Unremarkable Spleen: Unremarkable Adrenals/Urinary Tract: Both adrenal glands appear normal. 1.8 cm in diameter fluid density lesion of the right kidney upper pole most compatible with a cyst. The kidneys appear otherwise unremarkable. Bladder unremarkable. Stomach/Bowel: Possible mild wall thickening in the rectum. Otherwise unremarkable. Vascular/Lymphatic: Aortoiliac atherosclerotic vascular disease. Left inguinal lymph node 1.2 cm in short axis, previously the same. Left external iliac node 0.9 cm in short axis, formerly 1.4 cm. Reproductive: Punctate calcifications in the prostate gland. Other: No supplemental non-categorized findings. Musculoskeletal: Postoperative findings in the lower lumbar spine. Graft harvesting site from the right iliac bone. Fused what right  sacroiliac joint with at least partial bridging of the  left sacroiliac joint. Large synovial herniation pit of the right hip. Inferior endplate sclerosis at L3 surrounding a Schmorl's node, image 74/5. IMPRESSION: 1. Enlarging sclerotic focus along the posteroinferior T6 vertebral body, [name]. Some of these were present and not significantly hypermetabolic on the prior PET-CT, but overall the appearance does raise concern for osseous metastatic disease. 2. Previous left pelvic adenopathy has significantly improved, with the left external iliac node at 0.9 cm in short axis, formerly 1.4 cm. 3. Other imaging findings of potential clinical significance: Aortic Atherosclerosis (ICD10-I70.0). Coronary atherosclerosis. Airway thickening is present, suggesting bronchitis or reactive airways disease. Possible wall thickening in the rectum, nonspecific. Electronically Signed   By: Van Clines M.D.   On: 04/28/2019 10:42   Ct Chest Wo Contrast  Result Date: 04/28/2019 CLINICAL DATA:  Prostate cancer restaging. EXAM: CT CHEST, ABDOMEN AND PELVIS WITHOUT CONTRAST TECHNIQUE: Multidetector CT imaging of the chest, abdomen and pelvis was performed following the standard protocol without IV contrast. COMPARISON:  PET-CT from 03/11/2018 FINDINGS: CT CHEST FINDINGS Cardiovascular: Descending thoracic aortic and left anterior descending coronary artery atherosclerotic calcification. Mediastinum/Nodes: Unremarkable Lungs/Pleura: Stable 3 by 4 mm lingular subpleural nodule on image 97/4. Airway thickening is present, suggesting bronchitis or reactive airways disease. Musculoskeletal: Degenerated right AC joint. Wide left AC joint, query prior distal clavicular resection on the right. Degenerative right sternoclavicular arthropathy. Subtle sclerosis anteriorly in the right third rib on  image 58/4, not readily appreciable on the prior exam subtle sclerosis posteriorly in the right second rib on image 39/4, not well appreciated on the prior exam. Mild sclerosis inferiorly in the left lateral third rib. Small amount of lateral cortical thickening of the left lateral sixth rib on image 106/4. There is sclerosis in the left T2 pedicle, image 110/6, not previously hypermetabolic. Sclerosis along the posteroinferior T6 vertebra measuring 1.3 by 1.1 cm on image 95/6, previously there was only about a 3 mm small focus of sclerosis at this location. Additional small foci of sclerosis in several ribs. Increased size of a focus of sclerosis in the T12 vertebral body, 1.3 by 0.9 cm on image 81/6. CT ABDOMEN PELVIS FINDINGS Hepatobiliary: Unremarkable Pancreas: Unremarkable Spleen: Unremarkable Adrenals/Urinary Tract: Both adrenal glands appear normal. 1.8 cm in diameter fluid density lesion of the right kidney upper pole most compatible with a cyst. The kidneys appear otherwise unremarkable. Bladder unremarkable. Stomach/Bowel: Possible mild wall thickening in the rectum. Otherwise unremarkable. Vascular/Lymphatic: Aortoiliac atherosclerotic vascular disease. Left inguinal lymph node 1.2 cm in short axis, previously the same. Left external iliac node 0.9 cm in short axis, formerly 1.4 cm. Reproductive: Punctate calcifications in the prostate gland. Other: No supplemental non-categorized findings. Musculoskeletal: Postoperative findings in the lower lumbar spine. Graft harvesting site from the right iliac bone. Fused what right sacroiliac joint with at least partial bridging of the left sacroiliac joint. Large synovial herniation pit of the right hip. Inferior endplate sclerosis at L3 surrounding a Schmorl's node, image 74/5. IMPRESSION: 1. Enlarging sclerotic focus along the posteroinferior T6 vertebral body, and enlarging sclerotic focus in the T12 vertebral body. Small bilateral rib sclerotic foci and a  sclerotic lesion in the left T2 vertebral pedicle. Some of these were present and not significantly hypermetabolic on the prior PET-CT, but overall the appearance does raise concern for osseous metastatic disease. 2. Previous left pelvic adenopathy has significantly improved, with the  left external iliac node at 0.9 cm in short axis, formerly 1.4 cm. 3. Other imaging findings of potential clinical significance: Aortic Atherosclerosis (ICD10-I70.0). Coronary atherosclerosis. Airway thickening is present, suggesting bronchitis or reactive airways disease. Possible wall thickening in the rectum, nonspecific. Electronically Signed   By: Van Clines M.D.   On: 04/28/2019 10:42   Nm Bone Scan Whole Body  Result Date: 04/28/2019 CLINICAL DATA:  Prostate cancer. EXAM: NUCLEAR MEDICINE WHOLE BODY BONE SCAN TECHNIQUE: Whole body anterior and posterior images were obtained approximately 3 hours after intravenous injection of radiopharmaceutical. RADIOPHARMACEUTICALS:  22.5 mCi Technetium-93m MDP IV COMPARISON:  CT 04/28/2019 FINDINGS: Subtle foci of radiotracer activity within the anterolateral aspect of the LEFT and RIGHT third rib corresponds to sclerotic lesion on comparison CT and consistent with skeletal metastasis. Focal uptake in the T6 vertebral body also corresponds to sclerotic lesion on comparison CT. Focal uptake in the T1 region also corresponds to sclerotic lesion the pedicle on comparison CT. Focal uptake in the medial RIGHT clavicle may be degenerative. Probable degenerative uptake in LEFT acetabulum. IMPRESSION: 1. Focal uptake within the third ribs, T6 and T1 vertebral body correspond to sclerotic lesions on comparison CT and consistent with skeletal metastasis. 2. Probable degenerative uptake in the RIGHT clavicle and LEFT acetabulum. Electronically Signed   By: Suzy Bouchard M.D.   On: 04/28/2019 17:24     ASSESSMENT & PLAN:  1. Prostate cancer ([place])   2. B12 deficiency   3. Hot flash  in male   4. Prostate cancer metastatic to bone ([facility])   5. Stage 3b chronic kidney disease   6. Macrocytic anemia   7. Thrombocytopenia ([place])   Cancer Staging Prostate cancer [facility]) Staging form: Prostate, AJCC 8th Edition - Clinical stage from 03/19/2018: Stage IVB (cTX, cN1, cM1b) - Signed by [name], MD on 05/01/2019  #Metastatic prostate Cancer, castration sensitive PSA 24.18--> 33.15 Labs reviewed and discussed with patient.  Overall he tolerates chemotherapy treatments with mild to moderate difficulties. He has received G-CSF  Today he has neutropenia which is most likely secondary to chemotherapy. Anticipate recovery. Recommend patient to start empiric Levaquin 500 mg daily, 7 days supply prescription sent to pharmacy. Monitor PSA levels every 3 weeks. #Appetite is poor, not eating or drinking much fluid. Recommend increase oral hydration.  Repeat blood work on 05/28/2019.  #Thrombocytopenia, stable.  Chronic. #, Hemoglobin 11.6, worsened due to chemotherapy.  Continue to monitor #Vitamin B12 deficiencies, continue oral vitamin B12 supplementation. Repeat vitamin B12 level near future. .. #Chronic kidney disease, creatinine 1.54, encourage oral hydration.  Avoid nephrotoxins. #Hot flash, Stable.  Continue Effexor 37.5 mg daily.   #Bone pain, likely secondary to growth factors.  Advised patient to take Claritin for 4 days.  He may also take Tylenol 650 mg every 6 hours as needed for pain. Follow-up in 1 week for  repeat blood work Follow-up in 2 weeks for next cycle of chemotherapy.   [name], MD, PhD Hematology Oncology [facility] Pager- [phone] 05/25/2019

## 2019-05-27 ENCOUNTER — Other Ambulatory Visit: Payer: Self-pay

## 2019-05-28 ENCOUNTER — Other Ambulatory Visit: Payer: Self-pay

## 2019-05-28 ENCOUNTER — Inpatient Hospital Stay: Payer: Medicare HMO | Attending: Oncology

## 2019-05-28 DIAGNOSIS — Z7289 Other problems related to lifestyle: Secondary | ICD-10-CM | POA: Insufficient documentation

## 2019-05-28 DIAGNOSIS — F17210 Nicotine dependence, cigarettes, uncomplicated: Secondary | ICD-10-CM | POA: Diagnosis not present

## 2019-05-28 DIAGNOSIS — Z841 Family history of disorders of kidney and ureter: Secondary | ICD-10-CM | POA: Diagnosis not present

## 2019-05-28 DIAGNOSIS — R911 Solitary pulmonary nodule: Secondary | ICD-10-CM | POA: Insufficient documentation

## 2019-05-28 DIAGNOSIS — E538 Deficiency of other specified B group vitamins: Secondary | ICD-10-CM | POA: Insufficient documentation

## 2019-05-28 DIAGNOSIS — R972 Elevated prostate specific antigen [PSA]: Secondary | ICD-10-CM | POA: Insufficient documentation

## 2019-05-28 DIAGNOSIS — Z5111 Encounter for antineoplastic chemotherapy: Secondary | ICD-10-CM | POA: Diagnosis present

## 2019-05-28 DIAGNOSIS — I129 Hypertensive chronic kidney disease with stage 1 through stage 4 chronic kidney disease, or unspecified chronic kidney disease: Secondary | ICD-10-CM | POA: Insufficient documentation

## 2019-05-28 DIAGNOSIS — Z79899 Other long term (current) drug therapy: Secondary | ICD-10-CM | POA: Insufficient documentation

## 2019-05-28 DIAGNOSIS — Z833 Family history of diabetes mellitus: Secondary | ICD-10-CM | POA: Insufficient documentation

## 2019-05-28 DIAGNOSIS — Z880 Allergy status to penicillin: Secondary | ICD-10-CM | POA: Insufficient documentation

## 2019-05-28 DIAGNOSIS — Z823 Family history of stroke: Secondary | ICD-10-CM | POA: Insufficient documentation

## 2019-05-28 DIAGNOSIS — I700 Atherosclerosis of aorta: Secondary | ICD-10-CM | POA: Diagnosis not present

## 2019-05-28 DIAGNOSIS — R5383 Other fatigue: Secondary | ICD-10-CM | POA: Insufficient documentation

## 2019-05-28 DIAGNOSIS — D696 Thrombocytopenia, unspecified: Secondary | ICD-10-CM | POA: Diagnosis not present

## 2019-05-28 DIAGNOSIS — R232 Flushing: Secondary | ICD-10-CM | POA: Diagnosis not present

## 2019-05-28 DIAGNOSIS — D539 Nutritional anemia, unspecified: Secondary | ICD-10-CM | POA: Insufficient documentation

## 2019-05-28 DIAGNOSIS — I2510 Atherosclerotic heart disease of native coronary artery without angina pectoris: Secondary | ICD-10-CM | POA: Insufficient documentation

## 2019-05-28 DIAGNOSIS — C61 Malignant neoplasm of prostate: Secondary | ICD-10-CM

## 2019-05-28 DIAGNOSIS — N1832 Chronic kidney disease, stage 3b: Secondary | ICD-10-CM | POA: Diagnosis not present

## 2019-05-28 LAB — CBC WITH DIFFERENTIAL/PLATELET
Abs Immature Granulocytes: 0.41 10*3/uL — ABNORMAL HIGH (ref 0.00–0.07)
Basophils Absolute: 0.1 10*3/uL (ref 0.0–0.1)
Basophils Relative: 0 %
Eosinophils Absolute: 0 10*3/uL (ref 0.0–0.5)
Eosinophils Relative: 0 %
HCT: 35.1 % — ABNORMAL LOW (ref 39.0–52.0)
Hemoglobin: 11 g/dL — ABNORMAL LOW (ref 13.0–17.0)
Immature Granulocytes: 3 %
Lymphocytes Relative: 10 %
Lymphs Abs: 1.2 10*3/uL (ref 0.7–4.0)
MCH: 32.3 pg (ref 26.0–34.0)
MCHC: 31.3 g/dL (ref 30.0–36.0)
MCV: 102.9 fL — ABNORMAL HIGH (ref 80.0–100.0)
Monocytes Absolute: 1.1 10*3/uL — ABNORMAL HIGH (ref 0.1–1.0)
Monocytes Relative: 9 %
Neutro Abs: 9.6 10*3/uL — ABNORMAL HIGH (ref 1.7–7.7)
Neutrophils Relative %: 78 %
Platelets: 94 10*3/uL — ABNORMAL LOW (ref 150–400)
RBC: 3.41 MIL/uL — ABNORMAL LOW (ref 4.22–5.81)
RDW: 13.8 % (ref 11.5–15.5)
WBC: 12.3 10*3/uL — ABNORMAL HIGH (ref 4.0–10.5)
nRBC: 0.2 % (ref 0.0–0.2)

## 2019-05-28 LAB — COMPREHENSIVE METABOLIC PANEL
ALT: 16 U/L (ref 0–44)
AST: 26 U/L (ref 15–41)
Albumin: 3.6 g/dL (ref 3.5–5.0)
Alkaline Phosphatase: 106 U/L (ref 38–126)
Anion gap: 10 (ref 5–15)
BUN: 19 mg/dL (ref 8–23)
CO2: 24 mmol/L (ref 22–32)
Calcium: 8.8 mg/dL — ABNORMAL LOW (ref 8.9–10.3)
Chloride: 107 mmol/L (ref 98–111)
Creatinine, Ser: 1.52 mg/dL — ABNORMAL HIGH (ref 0.61–1.24)
GFR calc Af Amer: 55 mL/min — ABNORMAL LOW (ref >60)
GFR calc non Af Amer: 47 mL/min — ABNORMAL LOW (ref >60)
Glucose, Bld: 105 mg/dL — ABNORMAL HIGH (ref 70–99)
Potassium: 4.2 mmol/L (ref 3.5–5.1)
Sodium: 141 mmol/L (ref 135–145)
Total Bilirubin: 0.4 mg/dL (ref 0.3–1.2)
Total Protein: 6.4 g/dL — ABNORMAL LOW (ref 6.5–8.1)

## 2019-06-05 ENCOUNTER — Other Ambulatory Visit: Payer: Self-pay

## 2019-06-05 ENCOUNTER — Encounter: Payer: Self-pay | Admitting: Oncology

## 2019-06-05 ENCOUNTER — Inpatient Hospital Stay (HOSPITAL_BASED_OUTPATIENT_CLINIC_OR_DEPARTMENT_OTHER): Payer: Medicare HMO | Admitting: Oncology

## 2019-06-05 ENCOUNTER — Inpatient Hospital Stay: Payer: Medicare HMO

## 2019-06-05 VITALS — BP 194/96 | HR 62 | Resp 18

## 2019-06-05 VITALS — BP 181/93 | HR 61 | Temp 98.0°F | Resp 16 | Wt 211.3 lb

## 2019-06-05 DIAGNOSIS — E538 Deficiency of other specified B group vitamins: Secondary | ICD-10-CM | POA: Diagnosis not present

## 2019-06-05 DIAGNOSIS — C61 Malignant neoplasm of prostate: Secondary | ICD-10-CM

## 2019-06-05 DIAGNOSIS — Z79818 Long term (current) use of other agents affecting estrogen receptors and estrogen levels: Secondary | ICD-10-CM

## 2019-06-05 DIAGNOSIS — D539 Nutritional anemia, unspecified: Secondary | ICD-10-CM | POA: Diagnosis not present

## 2019-06-05 DIAGNOSIS — R232 Flushing: Secondary | ICD-10-CM

## 2019-06-05 DIAGNOSIS — N1832 Chronic kidney disease, stage 3b: Secondary | ICD-10-CM

## 2019-06-05 DIAGNOSIS — R972 Elevated prostate specific antigen [PSA]: Secondary | ICD-10-CM

## 2019-06-05 DIAGNOSIS — IMO0001 Reserved for inherently not codable concepts without codable children: Secondary | ICD-10-CM

## 2019-06-05 DIAGNOSIS — C7951 Secondary malignant neoplasm of bone: Secondary | ICD-10-CM

## 2019-06-05 DIAGNOSIS — Z5111 Encounter for antineoplastic chemotherapy: Secondary | ICD-10-CM | POA: Diagnosis not present

## 2019-06-05 DIAGNOSIS — Z7189 Other specified counseling: Secondary | ICD-10-CM

## 2019-06-05 DIAGNOSIS — Z95828 Presence of other vascular implants and grafts: Secondary | ICD-10-CM

## 2019-06-05 LAB — COMPREHENSIVE METABOLIC PANEL
ALT: 19 U/L (ref 0–44)
AST: 33 U/L (ref 15–41)
Albumin: 4.1 g/dL (ref 3.5–5.0)
Alkaline Phosphatase: 93 U/L (ref 38–126)
Anion gap: 9 (ref 5–15)
BUN: 28 mg/dL — ABNORMAL HIGH (ref 8–23)
CO2: 23 mmol/L (ref 22–32)
Calcium: 9.3 mg/dL (ref 8.9–10.3)
Chloride: 106 mmol/L (ref 98–111)
Creatinine, Ser: 1.44 mg/dL — ABNORMAL HIGH (ref 0.61–1.24)
GFR calc Af Amer: 58 mL/min — ABNORMAL LOW (ref >60)
GFR calc non Af Amer: 50 mL/min — ABNORMAL LOW (ref >60)
Glucose, Bld: 139 mg/dL — ABNORMAL HIGH (ref 70–99)
Potassium: 4.6 mmol/L (ref 3.5–5.1)
Sodium: 138 mmol/L (ref 135–145)
Total Bilirubin: 0.6 mg/dL (ref 0.3–1.2)
Total Protein: 7.3 g/dL (ref 6.5–8.1)

## 2019-06-05 LAB — CBC WITH DIFFERENTIAL/PLATELET
Abs Immature Granulocytes: 0.03 10*3/uL (ref 0.00–0.07)
Basophils Absolute: 0 10*3/uL (ref 0.0–0.1)
Basophils Relative: 0 %
Eosinophils Absolute: 0 10*3/uL (ref 0.0–0.5)
Eosinophils Relative: 0 %
HCT: 36.8 % — ABNORMAL LOW (ref 39.0–52.0)
Hemoglobin: 12 g/dL — ABNORMAL LOW (ref 13.0–17.0)
Immature Granulocytes: 1 %
Lymphocytes Relative: 7 %
Lymphs Abs: 0.4 10*3/uL — ABNORMAL LOW (ref 0.7–4.0)
MCH: 32.5 pg (ref 26.0–34.0)
MCHC: 32.6 g/dL (ref 30.0–36.0)
MCV: 99.7 fL (ref 80.0–100.0)
Monocytes Absolute: 0.1 10*3/uL (ref 0.1–1.0)
Monocytes Relative: 2 %
Neutro Abs: 5.1 10*3/uL (ref 1.7–7.7)
Neutrophils Relative %: 90 %
Platelets: 305 10*3/uL (ref 150–400)
RBC: 3.69 MIL/uL — ABNORMAL LOW (ref 4.22–5.81)
RDW: 14 % (ref 11.5–15.5)
WBC: 5.7 10*3/uL (ref 4.0–10.5)
nRBC: 0 % (ref 0.0–0.2)

## 2019-06-05 LAB — PSA: Prostatic Specific Antigen: 52.75 ng/mL — ABNORMAL HIGH (ref 0.00–4.00)

## 2019-06-05 MED ORDER — SODIUM CHLORIDE 0.9% FLUSH
10.0000 mL | Freq: Once | INTRAVENOUS | Status: AC
  Filled 2019-06-05: qty 10

## 2019-06-05 MED ORDER — DEGARELIX ACETATE(240 MG DOSE) 120 MG/VIAL ~~LOC~~ SOLR
240.0000 mg | Freq: Once | SUBCUTANEOUS | Status: AC
  Administered 2019-06-05: 240 mg via SUBCUTANEOUS
  Filled 2019-06-05: qty 6

## 2019-06-05 NOTE — Progress Notes (Signed)
Blood Pressure: 194/96. MD, Dr. Tasia Catchings, notified and aware. Per MD order: proceed with Firmagon injection today. Per MD order: hold Taxotere treatment today.

## 2019-06-05 NOTE — Progress Notes (Signed)
Patient does not offer any problems today. He did complete the Levaquin course due to possible side effects he read.  Blood pressure is high today and he says it is due to taking care of his wife that just had open heart surgery and was in hospital for 8 days.

## 2019-06-05 NOTE — Progress Notes (Signed)
Hematology/Oncology Follow up note [facility] [phone] [phone]   Patient Care Team: [name], MD as PCP - General (Family Medicine)  REFERRING PROVIDER: [facility] REASON FOR VISIT:  Reestablish care for prostate cancer.  HISTORY OF PRESENTING ILLNESS:  Terry Mitchell is a  68 y.o.  male with PMH listed below who was referred to me for evaluation of elevated PSA. Patient was referred by primary care physician to urology due to elevated PSA at the level of 72.  Per note Patient was referred to urology for further management.  Staging imaging has been ordered including abdominal and pelvis CT with contrast and bone scan.  Patient was also given Mills Koller for treatment of clinical prostate cancer.  Patient reports that he was scheduled to return to urologist office for biopsy. Patient denies any dysuria, hematuria, fever or chills.  He lives by himself. Denies any bone pain.  Reports some soreness at the area of [place] shots, as well as hot flushes.  Otherwise no complaints.  #01/01/2018 Patient received Firmagon loading dose at urologist office  # patient had a prostate biopsy on 02/28/2018 pathology showed prostate adenocarcinoma with androgen deprivation treatment effect.  Adenocarcinoma is present in all core fragments with involvement of 50% or more of each core.  Perineural invasion is present. As patient has received Firmagon prior to biopsy, Gleason grade cannot be reliably assessed after androgen deprivation therapy.  #  discussed with patient's urologist Dr. Bernardo Heater who did patient's prostate biopsy.  He agrees that given Mills Koller prior to biopsy and staging images are not typical.  He agrees that PET scan is consistent with lymph node involvement.  He does not think patient is a good candidate for radical prostatectomy.  He agrees with me about the plan that definitive radiation is a good option.  # September 2019   external Beam radiation. He was scheduled to have I-125 interstitial implant although that can not been done so patient underwent external beam IMRT prostate boost, finished in January 2020.  # patient was on androgen deprivation therapy with Firmagon injections, until August 2020. Declined ADT due to vasomotor symptoms.   #Blood work from 04/14/2019 showed testosterone level of 284, PSA has been decreased to 24.18.  # 04/28/2019 CT chest abdomen pelvis and bone scan images were independently reviewed by me and discussed with patient. Images are consistent with metastatic prostate cancer.  INTERVAL HISTORY Terry Mitchell is a 68 y.o. male who has above history reviewed by me today presents for follow-up visit for management of Stage IVA (cTX, cN1, cM1) castration sensitive metastatic prostate cancer Patient presents for evaluation prior to chemotherapy. He developed neutropenia 1 week after treatment.  Was given a course of Levaquin antibiotics for neutropenia prophylaxis.  Patient did not take due to fear of side effects. Denies fever, chills, nausea, vomiting, diarrhea, chest pain, shortness of breath, abdominal pain, urinary symptoms, lower extremity swelling.  Continues to have intermittent hot flashes, patient was advised to take Effexor.  Patient reports that he took a few pills and stopped.  He did rather have hot flash and then having side effects from medications. Chronic fatigue worsened after last chemotherapy.   Review of Systems  Constitutional: Positive for fatigue. Negative for appetite change, chills, fever and unexpected weight change.  HENT:   Negative for hearing loss and voice change.   Eyes: Negative for eye problems and icterus.  Respiratory: Negative for chest tightness, cough and shortness of breath.  Cardiovascular: Negative for chest pain and leg swelling.  Gastrointestinal: Negative for abdominal distention and abdominal pain.  Endocrine: Positive for hot flashes.   Genitourinary: Negative for difficulty urinating, dysuria and frequency.   Musculoskeletal: Negative for arthralgias.  Skin: Negative for itching and rash.  Neurological: Negative for light-headedness and numbness.  Hematological: Negative for adenopathy. Does not bruise/bleed easily.  Psychiatric/Behavioral: Negative for confusion.     MEDICAL HISTORY:  Past Medical History:  Diagnosis Date   Depression    recently went on disability d/t diagnosis   History of recent fall 01/2018   missed the last step on ladder, causing back discomfort   Hypertension    Prostate cancer ([place]) 12/2017   prostate    SURGICAL HISTORY: Past Surgical History:  Procedure Laterality Date   BACK SURGERY  2001    2 rods and 6 screws in back   Left shoulder surgery Left 1998   removed a piece of bone around collar bone   PROSTATE BIOPSY N/A 02/28/2018   Procedure: PROSTATE BIOPSY;  Surgeon: Abbie Sons, MD;  Location: [facility] ORS;  Service: Urology;  Laterality: N/A;   TRANSRECTAL ULTRASOUND N/A 02/28/2018   Procedure: TRANSRECTAL ULTRASOUND;  Surgeon: Abbie Sons, MD;  Location: [facility] ORS;  Service: Urology;  Laterality: N/A;    SOCIAL HISTORY: Social History   Socioeconomic History   Marital status: Single    Spouse name: Not on file   Number of children: 4   Years of education: Not on file   Highest education level: Not on file  Occupational History   Occupation: disability  Social Designer, fashion/clothing strain: Very hard   Food insecurity    Worry: Sometimes true    Inability: Often true   Transportation needs    Medical: No    Non-medical: Yes  Tobacco Use   Smoking status: Current Every Day Smoker    Packs/day: 2.00    Types: Cigars   Smokeless tobacco: Never Used   Tobacco comment: 2 cigars  Substance and Sexual Activity   Alcohol use: Yes    Alcohol/week: 9.0 standard drinks    Types: 9 Cans of beer per week    Comment: occasional   Drug use:  Yes    Types: Cocaine, "Crack" cocaine    Comment: last use about 3 days ago    Sexual activity: Not Currently  Lifestyle   Physical activity    Days per week: 7 days    Minutes per session: 60 min   Stress: Rather much  Relationships   Social connections    Talks on phone: Not on file    Gets together: Not on file    Attends religious service: Not on file    Active member of club or organization: Not on file    Attends meetings of clubs or organizations: Not on file    Relationship status: Not on file   Intimate partner violence    Fear of current or ex partner: Not on file    Emotionally abused: Not on file    Physically abused: Not on file    Forced sexual activity: Not on file  Other Topics Concern   Not on file  Social History Narrative   Not on file    FAMILY HISTORY: Family History  Problem Relation Age of Onset   Stroke Mother    Kidney failure Mother    Diabetes Maternal Aunt     ALLERGIES:  is allergic to penicillins  and lactose intolerance (gi).  MEDICATIONS:  Current Outpatient Medications  Medication Sig Dispense Refill   albuterol (VENTOLIN HFA) 108 (90 Base) MCG/ACT inhaler Inhale 2 puffs into the lungs every 6 (six) hours as needed for wheezing or shortness of breath. 6.7 g 0   dexamethasone (DECADRON) 4 MG tablet Take 2 tablets (8 mg total) by mouth 2 (two) times daily. Start the day before Taxotere. Then daily after chemo for 2 days. 30 tablet 1   loratadine (CLARITIN) 10 MG tablet Take 10 mg by mouth daily. In prep for fulphila     losartan (COZAAR) 50 MG tablet Take 50 mg by mouth daily after breakfast.   2   ondansetron (ZOFRAN) 8 MG tablet Take 1 tablet (8 mg total) by mouth 2 (two) times daily as needed for refractory nausea / vomiting. 30 tablet 1   prochlorperazine (COMPAZINE) 10 MG tablet Take 1 tablet (10 mg total) by mouth every 6 (six) hours as needed (Nausea or vomiting). 30 tablet 1   vitamin B-12 (CYANOCOBALAMIN) 1000 MCG  tablet Take 1 tablet (1,000 mcg total) by mouth daily. 90 tablet 1   levofloxacin (LEVAQUIN) 500 MG tablet Take 1 tablet (500 mg total) by mouth daily. (Patient not taking: Reported on 06/05/2019) 7 tablet 0   venlafaxine (EFFEXOR) 37.5 MG tablet Take 1 tablet (37.5 mg total) by mouth daily. (Patient not taking: Reported on 06/05/2019) 30 tablet 0   No current facility-administered medications for this visit.    Facility-Administered Medications Ordered in Other Visits  Medication Dose Route Frequency Provider Last Rate Last Dose   sodium chloride flush (NS) 0.9 % injection 10 mL  10 mL Intravenous Once [name], MD         PHYSICAL EXAMINATION: ECOG PERFORMANCE STATUS: 1 - Symptomatic but completely ambulatory Vitals:   06/05/19 0845  BP: (!) 181/93  Pulse: 61  Resp: 16  Temp: 98 F (36.7 C)   Filed Weights   06/05/19 0845  Weight: 211 lb 4.8 oz (95.8 kg)    Physical Exam Constitutional:      General: He is not in acute distress. HENT:     Head: Normocephalic and atraumatic.  Eyes:     General: No scleral icterus.    Conjunctiva/sclera: Conjunctivae normal.     Pupils: Pupils are equal, round, and reactive to light.  Neck:     Musculoskeletal: Normal range of motion and neck supple.  Cardiovascular:     Rate and Rhythm: Normal rate and regular rhythm.     Heart sounds: Normal heart sounds.  Pulmonary:     Effort: Pulmonary effort is normal. No respiratory distress.     Breath sounds: No wheezing.  Abdominal:     General: Bowel sounds are normal. There is no distension.     Palpations: Abdomen is soft. There is no mass.     Tenderness: There is no abdominal tenderness.  Musculoskeletal: Normal range of motion.        General: No deformity.  Lymphadenopathy:     Cervical: No cervical adenopathy.  Skin:    General: Skin is warm and dry.     Findings: No erythema or rash.  Neurological:     Mental Status: He is alert and oriented to person, place, and time.      Cranial Nerves: No cranial nerve deficit.     Coordination: Coordination normal.  Psychiatric:        Behavior: Behavior normal.  Thought Content: Thought content normal.      LABORATORY DATA:  I have reviewed the data as listed Lab Results  Component Value Date   WBC 5.7 06/05/2019   HGB 12.0 (L) 06/05/2019   HCT 36.8 (L) 06/05/2019   MCV 99.7 06/05/2019   PLT 305 06/05/2019   Recent Labs    05/22/19 1324 05/28/19 1306 06/05/19 0805  NA 138 141 138  K 4.0 4.2 4.6  CL 105 107 106  CO2 25 24 23   GLUCOSE 103* 105* 139*  BUN 26* 19 28*  CREATININE 1.54* 1.52* 1.44*  CALCIUM 8.9 8.8* 9.3  GFRNONAA 46* 47* 50*  GFRAA 54* 55* 58*  PROT 6.6 6.4* 7.3  ALBUMIN 3.6 3.6 4.1  AST 20 26 33  ALT 15 16 19   ALKPHOS 73 106 93  BILITOT 0.6 0.4 0.6   Iron/TIBC/Ferritin/ %Sat No results found for: IRON, TIBC, FERRITIN, IRONPCTSAT   RADIOGRAPHIC STUDIES: I have personally reviewed the radiological images as listed and agreed with the findings in the report. PET scan  Patient had an enlarged left external iliac node measuring 1.4 cm in short axis with maximum SUV 2.5.  Although activity level is relatively low, given the size of the lymph node suspicion is raised for the possibility of metastatic disease. There is also a 7 mm right external iliac lymph node and a 1.2 cm left inguinal lymph node demonstrate only low metabolic activity.  Not overtly enlarged.  [name], faint sclerosis in the right anterior sixth rib.  Neither has significant metabolic activity.  Significance uncertain. Benign-appearing rib fractures with associated low-grade activity along several right anterior ribs include the right ninth rib. Activity in the cecum without associated CT abnormality.  Most likely benign. Ct Abdomen Pelvis Wo Contrast  Result Date: 04/28/2019 CLINICAL DATA:  Prostate cancer restaging. EXAM: CT CHEST, ABDOMEN AND PELVIS  WITHOUT CONTRAST TECHNIQUE: Multidetector CT imaging of the chest, abdomen and pelvis was performed following the standard protocol without IV contrast. COMPARISON:  PET-CT from 03/11/2018 FINDINGS: CT CHEST FINDINGS Cardiovascular: Descending thoracic aortic and left anterior descending coronary artery atherosclerotic calcification. Mediastinum/Nodes: Unremarkable Lungs/Pleura: Stable 3 by 4 mm lingular subpleural nodule on image 97/4. Airway thickening is present, suggesting bronchitis or reactive airways disease. Musculoskeletal: Degenerated right AC joint. Wide left AC joint, query prior distal clavicular resection on the right. Degenerative right sternoclavicular arthropathy. Subtle sclerosis anteriorly in the right third rib on image 58/4, not readily appreciable on the prior exam subtle sclerosis posteriorly in the right second rib on image 39/4, not well appreciated on the prior exam. Mild sclerosis inferiorly in the left lateral third rib. Small amount of lateral cortical thickening of the left lateral sixth rib on image 106/4. There is sclerosis in the left T2 pedicle, image 110/6, not previously hypermetabolic. Sclerosis along the posteroinferior T6 vertebra measuring 1.3 by 1.1 cm on image 95/6, previously there was only about a 3 mm small focus of sclerosis at this location. Additional small foci of sclerosis in several ribs. Increased size of a focus of sclerosis in the T12 vertebral body, 1.3 by 0.9 cm on image 81/6. CT ABDOMEN PELVIS FINDINGS Hepatobiliary: Unremarkable Pancreas: Unremarkable Spleen: Unremarkable Adrenals/Urinary Tract: Both adrenal glands appear normal. 1.8 cm in diameter fluid density lesion of the right kidney upper pole most compatible with a cyst. The kidneys appear otherwise unremarkable. Bladder unremarkable. Stomach/Bowel: Possible mild wall thickening in the rectum. Otherwise unremarkable. Vascular/Lymphatic:  Aortoiliac atherosclerotic vascular disease. Left inguinal lymph  node 1.2 cm in short axis, previously the same. Left external iliac node 0.9 cm in short axis, formerly 1.4 cm. Reproductive: Punctate calcifications in the prostate gland. Other: No supplemental non-categorized findings. Musculoskeletal: Postoperative findings in the lower lumbar spine. Graft harvesting site from the right iliac bone. Fused what right sacroiliac joint with at least partial bridging of the left sacroiliac joint. Large synovial herniation pit of the right hip. Inferior endplate sclerosis at L3 surrounding a Schmorl's node, image 74/5. IMPRESSION: 1. Enlarging sclerotic focus along the posteroinferior T6 vertebral body, [name]. Some of these were present and not significantly hypermetabolic on the prior PET-CT, but overall the appearance does raise concern for osseous metastatic disease. 2. Previous left pelvic adenopathy has significantly improved, with the left external iliac node at 0.9 cm in short axis, formerly 1.4 cm. 3. Other imaging findings of potential clinical significance: Aortic Atherosclerosis (ICD10-I70.0). Coronary atherosclerosis. Airway thickening is present, suggesting bronchitis or reactive airways disease. Possible wall thickening in the rectum, nonspecific. Electronically Signed   By: Van Clines M.D.   On: 04/28/2019 10:42   Ct Chest Wo Contrast  Result Date: 04/28/2019 CLINICAL DATA:  Prostate cancer restaging. EXAM: CT CHEST, ABDOMEN AND PELVIS WITHOUT CONTRAST TECHNIQUE: Multidetector CT imaging of the chest, abdomen and pelvis was performed following the standard protocol without IV contrast. COMPARISON:  PET-CT from 03/11/2018 FINDINGS: CT CHEST FINDINGS Cardiovascular: Descending thoracic aortic and left anterior descending coronary artery atherosclerotic calcification. Mediastinum/Nodes: Unremarkable Lungs/Pleura: Stable 3  by 4 mm lingular subpleural nodule on image 97/4. Airway thickening is present, suggesting bronchitis or reactive airways disease. Musculoskeletal: Degenerated right AC joint. Wide left AC joint, query prior distal clavicular resection on the right. Degenerative right sternoclavicular arthropathy. Subtle sclerosis anteriorly in the right third rib on image 58/4, not readily appreciable on the prior exam subtle sclerosis posteriorly in the right second rib on image 39/4, not well appreciated on the prior exam. Mild sclerosis inferiorly in the left lateral third rib. Small amount of lateral cortical thickening of the left lateral sixth rib on image 106/4. There is sclerosis in the left T2 pedicle, image 110/6, not previously hypermetabolic. Sclerosis along the posteroinferior T6 vertebra measuring 1.3 by 1.1 cm on image 95/6, previously there was only about a 3 mm small focus of sclerosis at this location. Additional small foci of sclerosis in several ribs. Increased size of a focus of sclerosis in the T12 vertebral body, 1.3 by 0.9 cm on image 81/6. CT ABDOMEN PELVIS FINDINGS Hepatobiliary: Unremarkable Pancreas: Unremarkable Spleen: Unremarkable Adrenals/Urinary Tract: Both adrenal glands appear normal. 1.8 cm in diameter fluid density lesion of the right kidney upper pole most compatible with a cyst. The kidneys appear otherwise unremarkable. Bladder unremarkable. Stomach/Bowel: Possible mild wall thickening in the rectum. Otherwise unremarkable. Vascular/Lymphatic: Aortoiliac atherosclerotic vascular disease. Left inguinal lymph node 1.2 cm in short axis, previously the same. Left external iliac node 0.9 cm in short axis, formerly 1.4 cm. Reproductive: Punctate calcifications in the prostate gland. Other: No supplemental non-categorized findings. Musculoskeletal: Postoperative findings in the lower lumbar spine. Graft harvesting site from the right iliac bone. Fused what right sacroiliac joint with at least  partial bridging of the left sacroiliac joint. Large synovial herniation pit of the right hip. Inferior endplate sclerosis at L3 surrounding a Schmorl's node, image 74/5.  IMPRESSION: 1. Enlarging sclerotic focus along the posteroinferior T6 vertebral body, [name]. Some of these were present and not significantly hypermetabolic on the prior PET-CT, but overall the appearance does raise concern for osseous metastatic disease. 2. Previous left pelvic adenopathy has significantly improved, with the left external iliac node at 0.9 cm in short axis, formerly 1.4 cm. 3. Other imaging findings of potential clinical significance: Aortic Atherosclerosis (ICD10-I70.0). Coronary atherosclerosis. Airway thickening is present, suggesting bronchitis or reactive airways disease. Possible wall thickening in the rectum, nonspecific. Electronically Signed   By: Van Clines M.D.   On: 04/28/2019 10:42   Nm Bone Scan Whole Body  Result Date: 04/28/2019 CLINICAL DATA:  Prostate cancer. EXAM: NUCLEAR MEDICINE WHOLE BODY BONE SCAN TECHNIQUE: Whole body anterior and posterior images were obtained approximately 3 hours after intravenous injection of radiopharmaceutical. RADIOPHARMACEUTICALS:  22.5 mCi Technetium-34m MDP IV COMPARISON:  CT 04/28/2019 FINDINGS: Subtle foci of radiotracer activity within the anterolateral aspect of the LEFT and RIGHT third rib corresponds to sclerotic lesion on comparison CT and consistent with skeletal metastasis. Focal uptake in the T6 vertebral body also corresponds to sclerotic lesion on comparison CT. Focal uptake in the T1 region also corresponds to sclerotic lesion the pedicle on comparison CT. Focal uptake in the medial RIGHT clavicle may be degenerative. Probable degenerative uptake in LEFT acetabulum. IMPRESSION: 1. Focal uptake within the third ribs,  T6 and T1 vertebral body correspond to sclerotic lesions on comparison CT and consistent with skeletal metastasis. 2. Probable degenerative uptake in the RIGHT clavicle and LEFT acetabulum. Electronically Signed   By: Suzy Bouchard M.D.   On: 04/28/2019 17:24     ASSESSMENT & PLAN:  1. Prostate cancer ([place])   2. Hot flash in male   3. Macrocytic anemia   4. B12 deficiency   5. Stage 3b chronic kidney disease   Cancer Staging Prostate cancer [facility]) Staging form: Prostate, AJCC 8th Edition - Clinical stage from 03/19/2018: Stage IVB (cTX, cN1, cM1b) - Signed by [name], MD on 05/01/2019  #Metastatic prostate Cancer, castration sensitive PSA 24.18--> 33.15 Status post 1 cycle of docetaxel. Overall tolerates well. Received G-CSF support. Labs reviewed and discussed with patient. Counts acceptable to proceed with docetaxel today. # Uncontrolled HTN However patient has uncontrolled high blood pressure. Blood pressure in the clinic initially was 181/93.  Recheck systolic blood pressure went up to 194. Patient does not want to wait and recheck blood pressure.  He prefers to have chemotherapy rescheduled. Cancer center has no available chair next week. Patient prefers to reschedule with me in 3 weeks.  Advise patient to be complaint with his blood pressure medication.   #Androgen deprivation therapy Patient has been reluctant to restart androgen deprivation therapy.  We had a lengthy discussion today and talked about rationale of ADT. He finally agrees with proceeding with ADT.  Initially he also worries about increased libido and sexual performance.  I offered to prescribe him medication to help sexual performance.  Then he said since his partner just had a surgery, he does not want to start anything right now.  I also discussed about him to follow-up with urology to further address this problem. Proceed with loading dose of Firmagon today. He prefers every three months ADT and will switch to  Leupron in 4 weeks.   #Thrombocytopenia, stable.  Chronic. # Hemoglobin 11.6,  worsened due to chemotherapy.  Continue to monitor #Vitamin B12 deficiencies, continue oral vitamin B12 supplementation. Repeat vitamin B12 level near future. .. #Chronic kidney disease, creatinine 1.44, encourage oral hydration.  Avoid nephrotoxins. #Hot flash, Stable.  He declines Effexor   Follow-up in 3 weeks for next cycle of chemotherapy. We spent sufficient time to discuss many aspect of care, questions were answered to patient's satisfaction. Total face to face encounter time for this patient visit was 40 min. >50% of the time was  spent in counseling and coordination of care.    [name], MD, PhD Hematology Oncology [facility] Pager- [phone] 06/05/2019

## 2019-06-08 ENCOUNTER — Inpatient Hospital Stay: Payer: Medicare HMO

## 2019-06-09 ENCOUNTER — Ambulatory Visit: Payer: Medicare HMO

## 2019-06-25 ENCOUNTER — Encounter: Payer: Self-pay | Admitting: Oncology

## 2019-06-25 ENCOUNTER — Other Ambulatory Visit: Payer: Self-pay

## 2019-06-25 NOTE — Progress Notes (Signed)
Patient prescreened for appointment. No concerns voiced. Advised him to take blood pressure medication prior to visit.

## 2019-06-26 ENCOUNTER — Inpatient Hospital Stay: Payer: Medicare HMO | Attending: Oncology

## 2019-06-26 ENCOUNTER — Inpatient Hospital Stay: Payer: Medicare HMO

## 2019-06-26 ENCOUNTER — Other Ambulatory Visit: Payer: Self-pay

## 2019-06-26 ENCOUNTER — Inpatient Hospital Stay (HOSPITAL_BASED_OUTPATIENT_CLINIC_OR_DEPARTMENT_OTHER): Payer: Medicare HMO | Admitting: Oncology

## 2019-06-26 VITALS — BP 180/95 | HR 73 | Temp 95.9°F | Resp 16 | Wt 211.6 lb

## 2019-06-26 DIAGNOSIS — Z841 Family history of disorders of kidney and ureter: Secondary | ICD-10-CM | POA: Insufficient documentation

## 2019-06-26 DIAGNOSIS — C7951 Secondary malignant neoplasm of bone: Secondary | ICD-10-CM | POA: Insufficient documentation

## 2019-06-26 DIAGNOSIS — I700 Atherosclerosis of aorta: Secondary | ICD-10-CM | POA: Diagnosis not present

## 2019-06-26 DIAGNOSIS — F1490 Cocaine use, unspecified, uncomplicated: Secondary | ICD-10-CM | POA: Insufficient documentation

## 2019-06-26 DIAGNOSIS — C61 Malignant neoplasm of prostate: Secondary | ICD-10-CM

## 2019-06-26 DIAGNOSIS — R232 Flushing: Secondary | ICD-10-CM

## 2019-06-26 DIAGNOSIS — Z79818 Long term (current) use of other agents affecting estrogen receptors and estrogen levels: Secondary | ICD-10-CM

## 2019-06-26 DIAGNOSIS — F17210 Nicotine dependence, cigarettes, uncomplicated: Secondary | ICD-10-CM | POA: Diagnosis not present

## 2019-06-26 DIAGNOSIS — R5382 Chronic fatigue, unspecified: Secondary | ICD-10-CM | POA: Diagnosis not present

## 2019-06-26 DIAGNOSIS — I10 Essential (primary) hypertension: Secondary | ICD-10-CM | POA: Diagnosis not present

## 2019-06-26 DIAGNOSIS — Z880 Allergy status to penicillin: Secondary | ICD-10-CM | POA: Diagnosis not present

## 2019-06-26 DIAGNOSIS — E538 Deficiency of other specified B group vitamins: Secondary | ICD-10-CM

## 2019-06-26 DIAGNOSIS — Z79899 Other long term (current) drug therapy: Secondary | ICD-10-CM | POA: Insufficient documentation

## 2019-06-26 DIAGNOSIS — Z833 Family history of diabetes mellitus: Secondary | ICD-10-CM | POA: Diagnosis not present

## 2019-06-26 DIAGNOSIS — Z5111 Encounter for antineoplastic chemotherapy: Secondary | ICD-10-CM | POA: Insufficient documentation

## 2019-06-26 DIAGNOSIS — Z823 Family history of stroke: Secondary | ICD-10-CM | POA: Insufficient documentation

## 2019-06-26 DIAGNOSIS — I2510 Atherosclerotic heart disease of native coronary artery without angina pectoris: Secondary | ICD-10-CM | POA: Diagnosis not present

## 2019-06-26 DIAGNOSIS — Z7289 Other problems related to lifestyle: Secondary | ICD-10-CM | POA: Diagnosis not present

## 2019-06-26 DIAGNOSIS — Z5189 Encounter for other specified aftercare: Secondary | ICD-10-CM | POA: Insufficient documentation

## 2019-06-26 DIAGNOSIS — R911 Solitary pulmonary nodule: Secondary | ICD-10-CM | POA: Insufficient documentation

## 2019-06-26 LAB — CBC WITH DIFFERENTIAL/PLATELET
Abs Immature Granulocytes: 0.02 10*3/uL (ref 0.00–0.07)
Basophils Absolute: 0 10*3/uL (ref 0.0–0.1)
Basophils Relative: 0 %
Eosinophils Absolute: 0 10*3/uL (ref 0.0–0.5)
Eosinophils Relative: 0 %
HCT: 36.4 % — ABNORMAL LOW (ref 39.0–52.0)
Hemoglobin: 11.5 g/dL — ABNORMAL LOW (ref 13.0–17.0)
Immature Granulocytes: 0 %
Lymphocytes Relative: 9 %
Lymphs Abs: 0.6 10*3/uL — ABNORMAL LOW (ref 0.7–4.0)
MCH: 32.4 pg (ref 26.0–34.0)
MCHC: 31.6 g/dL (ref 30.0–36.0)
MCV: 102.5 fL — ABNORMAL HIGH (ref 80.0–100.0)
Monocytes Absolute: 0.4 10*3/uL (ref 0.1–1.0)
Monocytes Relative: 7 %
Neutro Abs: 5.1 10*3/uL (ref 1.7–7.7)
Neutrophils Relative %: 84 %
Platelets: 132 10*3/uL — ABNORMAL LOW (ref 150–400)
RBC: 3.55 MIL/uL — ABNORMAL LOW (ref 4.22–5.81)
RDW: 13.3 % (ref 11.5–15.5)
WBC: 6.1 10*3/uL (ref 4.0–10.5)
nRBC: 0 % (ref 0.0–0.2)

## 2019-06-26 LAB — PSA: Prostatic Specific Antigen: 13.98 ng/mL — ABNORMAL HIGH (ref 0.00–4.00)

## 2019-06-26 LAB — COMPREHENSIVE METABOLIC PANEL
ALT: 18 U/L (ref 0–44)
AST: 29 U/L (ref 15–41)
Albumin: 4.4 g/dL (ref 3.5–5.0)
Alkaline Phosphatase: 101 U/L (ref 38–126)
Anion gap: 13 (ref 5–15)
BUN: 45 mg/dL — ABNORMAL HIGH (ref 8–23)
CO2: 20 mmol/L — ABNORMAL LOW (ref 22–32)
Calcium: 9.3 mg/dL (ref 8.9–10.3)
Chloride: 105 mmol/L (ref 98–111)
Creatinine, Ser: 1.57 mg/dL — ABNORMAL HIGH (ref 0.61–1.24)
GFR calc Af Amer: 52 mL/min — ABNORMAL LOW (ref >60)
GFR calc non Af Amer: 45 mL/min — ABNORMAL LOW (ref >60)
Glucose, Bld: 116 mg/dL — ABNORMAL HIGH (ref 70–99)
Potassium: 4.4 mmol/L (ref 3.5–5.1)
Sodium: 138 mmol/L (ref 135–145)
Total Bilirubin: 0.9 mg/dL (ref 0.3–1.2)
Total Protein: 7.3 g/dL (ref 6.5–8.1)

## 2019-06-26 LAB — VITAMIN B12: Vitamin B-12: 2404 pg/mL — ABNORMAL HIGH (ref 180–914)

## 2019-06-26 MED ORDER — HEPARIN SOD (PORK) LOCK FLUSH 100 UNIT/ML IV SOLN: 500.0000 [IU] | Freq: Once | INTRAVENOUS | Status: DC

## 2019-06-26 MED ORDER — AMLODIPINE BESYLATE 5 MG PO TABS: 5.0000 mg | ORAL_TABLET | Freq: Every day | ORAL | 0 refills | Status: DC

## 2019-06-27 LAB — TESTOSTERONE: Testosterone: <3 ng/dL — ABNORMAL LOW (ref 264–916)

## 2019-06-27 NOTE — Progress Notes (Signed)
Hematology/Oncology Follow up note [facility] Telephone:([phone] [phone]   Patient Care Team: [name], MD as PCP - General (Family Medicine)  REFERRING PROVIDER: [facility] REASON FOR VISIT:  Reestablish care for prostate cancer.  HISTORY OF PRESENTING ILLNESS:  Terry Mitchell is a  68 y.o.  male with PMH listed below who was referred to me for evaluation of elevated PSA. Patient was referred by primary care physician to urology due to elevated PSA at the level of 72.  Per note Patient was referred to urology for further management.  Staging imaging has been ordered including abdominal and pelvis CT with contrast and bone scan.  Patient was also given Mills Koller for treatment of clinical prostate cancer.  Patient reports that he was scheduled to return to urologist office for biopsy. Patient denies any dysuria, hematuria, fever or chills.  He lives by himself. Denies any bone pain.  Reports some soreness at the area of [place] shots, as well as hot flushes.  Otherwise no complaints.  #01/01/2018 Patient received Firmagon loading dose at urologist office  # patient had a prostate biopsy on 02/28/2018 pathology showed prostate adenocarcinoma with androgen deprivation treatment effect.  Adenocarcinoma is present in all core fragments with involvement of 50% or more of each core.  Perineural invasion is present. As patient has received Firmagon prior to biopsy, Gleason grade cannot be reliably assessed after androgen deprivation therapy.  #  discussed with patient's urologist Dr. Bernardo Heater who did patient's prostate biopsy.  He agrees that given Mills Koller prior to biopsy and staging images are not typical.  He agrees that PET scan is consistent with lymph node involvement.  He does not think patient is a good candidate for radical prostatectomy.  He agrees with me about the plan that definitive radiation is a good option.  # September 2019   external Beam radiation. He was scheduled to have I-125 interstitial implant although that can not been done so patient underwent external beam IMRT prostate boost, finished in January 2020.  # patient was on androgen deprivation therapy with Firmagon injections, until August 2020. Declined ADT due to vasomotor symptoms.   #Blood work from 04/14/2019 showed testosterone level of 284, PSA has been decreased to 24.18.  # 04/28/2019 CT chest abdomen pelvis and bone scan images were independently reviewed by me and discussed with patient. Images are consistent with metastatic prostate cancer.  INTERVAL HISTORY Terry Mitchell is a 68 y.o. male who has above history reviewed by me today presents for follow-up visit for management of Stage IVA (cTX, cN1, cM1) castration sensitive metastatic prostate cancer Patient presents for evaluation prior to chemotherapy.  #Patient continues to have intermittent hot flash. Last docetaxel treatment was on 05/15/2019. Patient presented on 06/05/2019 for cycle 2 of docetaxel and chemotherapy was hold due to uncontrolled hypertension. Patient prefers to delay to after Thanksgiving for reevaluation. Today his blood pressure continues to be high.  180/95.  Denies any headache, double vision, focal weakness. He reports being compliant with losartan. Chronic fatigue, stable.   Review of Systems  Constitutional: Positive for fatigue. Negative for appetite change, chills, fever and unexpected weight change.  HENT:   Negative for hearing loss and voice change.   Eyes: Negative for eye problems and icterus.  Respiratory: Negative for chest tightness, cough and shortness of breath.   Cardiovascular: Negative for chest pain and leg swelling.  Gastrointestinal: Negative for abdominal distention and abdominal pain.  Endocrine: Positive for hot  flashes.  Genitourinary: Negative for difficulty urinating, dysuria and frequency.   Musculoskeletal: Negative for arthralgias.    Skin: Negative for itching and rash.  Neurological: Negative for light-headedness and numbness.  Hematological: Negative for adenopathy. Does not bruise/bleed easily.  Psychiatric/Behavioral: Negative for confusion.     MEDICAL HISTORY:  Past Medical History:  Diagnosis Date   Depression    recently went on disability d/t diagnosis   History of recent fall 01/2018   missed the last step on ladder, causing back discomfort   Hypertension    Prostate cancer ([place]) 12/2017   prostate    SURGICAL HISTORY: Past Surgical History:  Procedure Laterality Date   BACK SURGERY  2001    2 rods and 6 screws in back   Left shoulder surgery Left 1998   removed a piece of bone around collar bone   PROSTATE BIOPSY N/A 02/28/2018   Procedure: PROSTATE BIOPSY;  Surgeon: Abbie Sons, MD;  Location: [facility] ORS;  Service: Urology;  Laterality: N/A;   TRANSRECTAL ULTRASOUND N/A 02/28/2018   Procedure: TRANSRECTAL ULTRASOUND;  Surgeon: Abbie Sons, MD;  Location: [facility] ORS;  Service: Urology;  Laterality: N/A;    SOCIAL HISTORY: Social History   Socioeconomic History   Marital status: Single    Spouse name: Not on file   Number of children: 4   Years of education: Not on file   Highest education level: Not on file  Occupational History   Occupation: disability  Social Designer, fashion/clothing strain: Very hard   Food insecurity    Worry: Sometimes true    Inability: Often true   Transportation needs    Medical: No    Non-medical: Yes  Tobacco Use   Smoking status: Current Every Day Smoker    Packs/day: 2.00    Types: Cigars   Smokeless tobacco: Never Used   Tobacco comment: 2 cigars  Substance and Sexual Activity   Alcohol use: Yes    Alcohol/week: 9.0 standard drinks    Types: 9 Cans of beer per week    Comment: occasional   Drug use: Yes    Types: Cocaine, "Crack" cocaine    Comment: last use about 3 days ago    Sexual activity: Not Currently   Lifestyle   Physical activity    Days per week: 7 days    Minutes per session: 60 min   Stress: Rather much  Relationships   Social connections    Talks on phone: Not on file    Gets together: Not on file    Attends religious service: Not on file    Active member of club or organization: Not on file    Attends meetings of clubs or organizations: Not on file    Relationship status: Not on file   Intimate partner violence    Fear of current or ex partner: Not on file    Emotionally abused: Not on file    Physically abused: Not on file    Forced sexual activity: Not on file  Other Topics Concern   Not on file  Social History Narrative   Not on file    FAMILY HISTORY: Family History  Problem Relation Age of Onset   Stroke Mother    Kidney failure Mother    Diabetes Maternal Aunt     ALLERGIES:  is allergic to penicillins and lactose intolerance (gi).  MEDICATIONS:  Current Outpatient Medications  Medication Sig Dispense Refill   albuterol (VENTOLIN HFA) 108 (  90 Base) MCG/ACT inhaler Inhale 2 puffs into the lungs every 6 (six) hours as needed for wheezing or shortness of breath. 6.7 g 0   dexamethasone (DECADRON) 4 MG tablet Take 2 tablets (8 mg total) by mouth 2 (two) times daily. Start the day before Taxotere. Then daily after chemo for 2 days. 30 tablet 1   loratadine (CLARITIN) 10 MG tablet Take 10 mg by mouth daily. In prep for fulphila     losartan (COZAAR) 50 MG tablet Take 50 mg by mouth daily after breakfast.   2   ondansetron (ZOFRAN) 8 MG tablet Take 1 tablet (8 mg total) by mouth 2 (two) times daily as needed for refractory nausea / vomiting. 30 tablet 1   prochlorperazine (COMPAZINE) 10 MG tablet Take 1 tablet (10 mg total) by mouth every 6 (six) hours as needed (Nausea or vomiting). 30 tablet 1   vitamin B-12 (CYANOCOBALAMIN) 1000 MCG tablet Take 1 tablet (1,000 mcg total) by mouth daily. 90 tablet 1   amLODipine (NORVASC) 5 MG tablet Take 1  tablet (5 mg total) by mouth daily. 30 tablet 0   venlafaxine (EFFEXOR) 37.5 MG tablet Take 1 tablet (37.5 mg total) by mouth daily. (Patient not taking: Reported on 06/05/2019) 30 tablet 0   No current facility-administered medications for this visit.    Facility-Administered Medications Ordered in Other Visits  Medication Dose Route Frequency Provider Last Rate Last Dose   sodium chloride flush (NS) 0.9 % injection 10 mL  10 mL Intravenous Once [name], MD         PHYSICAL EXAMINATION: ECOG PERFORMANCE STATUS: 1 - Symptomatic but completely ambulatory Vitals:   06/26/19 0905 06/26/19 0943  BP: (!) 185/93 (!) 180/95  Pulse: 73   Resp:    Temp:    SpO2:     Filed Weights   06/26/19 0901  Weight: 211 lb 9.6 oz (96 kg)    Physical Exam Constitutional:      General: He is not in acute distress. HENT:     Head: Normocephalic and atraumatic.  Eyes:     General: No scleral icterus.    Conjunctiva/sclera: Conjunctivae normal.     Pupils: Pupils are equal, round, and reactive to light.  Neck:     Musculoskeletal: Normal range of motion and neck supple.  Cardiovascular:     Rate and Rhythm: Normal rate and regular rhythm.     Heart sounds: Normal heart sounds.  Pulmonary:     Effort: Pulmonary effort is normal. No respiratory distress.     Breath sounds: No wheezing.  Abdominal:     General: Bowel sounds are normal. There is no distension.     Palpations: Abdomen is soft. There is no mass.     Tenderness: There is no abdominal tenderness.  Musculoskeletal: Normal range of motion.        General: No deformity.  Lymphadenopathy:     Cervical: No cervical adenopathy.  Skin:    General: Skin is warm and dry.     Findings: No erythema or rash.  Neurological:     Mental Status: He is alert and oriented to person, place, and time.     Cranial Nerves: No cranial nerve deficit.     Coordination: Coordination normal.  Psychiatric:        Behavior: Behavior normal.         Thought Content: Thought content normal.      LABORATORY DATA:  I have reviewed the data as  listed Lab Results  Component Value Date   WBC 6.1 06/26/2019   HGB 11.5 (L) 06/26/2019   HCT 36.4 (L) 06/26/2019   MCV 102.5 (H) 06/26/2019   PLT 132 (L) 06/26/2019   Recent Labs    05/28/19 1306 06/05/19 0805 06/26/19 0844  NA 141 138 138  K 4.2 4.6 4.4  CL 107 106 105  CO2 24 23 20*  GLUCOSE 105* 139* 116*  BUN 19 28* 45*  CREATININE 1.52* 1.44* 1.57*  CALCIUM 8.8* 9.3 9.3  GFRNONAA 47* 50* 45*  GFRAA 55* 58* 52*  PROT 6.4* 7.3 7.3  ALBUMIN 3.6 4.1 4.4  AST 26 33 29  ALT 16 19 18   ALKPHOS 106 93 101  BILITOT 0.4 0.6 0.9   Iron/TIBC/Ferritin/ %Sat No results found for: IRON, TIBC, FERRITIN, IRONPCTSAT   RADIOGRAPHIC STUDIES: I have personally reviewed the radiological images as listed and agreed with the findings in the report. PET scan  Patient had an enlarged left external iliac node measuring 1.4 cm in short axis with maximum SUV 2.5.  Although activity level is relatively low, given the size of the lymph node suspicion is raised for the possibility of metastatic disease. There is also a 7 mm right external iliac lymph node and a 1.2 cm left inguinal lymph node demonstrate only low metabolic activity.  Not overtly enlarged.  [name], faint sclerosis in the right anterior sixth rib.  Neither has significant metabolic activity.  Significance uncertain. Benign-appearing rib fractures with associated low-grade activity along several right anterior ribs include the right ninth rib. Activity in the cecum without associated CT abnormality.  Most likely benign. Ct Abdomen Pelvis Wo Contrast  Result Date: 04/28/2019 CLINICAL DATA:  Prostate cancer restaging. EXAM: CT CHEST, ABDOMEN AND PELVIS WITHOUT CONTRAST TECHNIQUE: Multidetector CT imaging of the chest, abdomen and pelvis was performed following the standard protocol  without IV contrast. COMPARISON:  PET-CT from 03/11/2018 FINDINGS: CT CHEST FINDINGS Cardiovascular: Descending thoracic aortic and left anterior descending coronary artery atherosclerotic calcification. Mediastinum/Nodes: Unremarkable Lungs/Pleura: Stable 3 by 4 mm lingular subpleural nodule on image 97/4. Airway thickening is present, suggesting bronchitis or reactive airways disease. Musculoskeletal: Degenerated right AC joint. Wide left AC joint, query prior distal clavicular resection on the right. Degenerative right sternoclavicular arthropathy. Subtle sclerosis anteriorly in the right third rib on image 58/4, not readily appreciable on the prior exam subtle sclerosis posteriorly in the right second rib on image 39/4, not well appreciated on the prior exam. Mild sclerosis inferiorly in the left lateral third rib. Small amount of lateral cortical thickening of the left lateral sixth rib on image 106/4. There is sclerosis in the left T2 pedicle, image 110/6, not previously hypermetabolic. Sclerosis along the posteroinferior T6 vertebra measuring 1.3 by 1.1 cm on image 95/6, previously there was only about a 3 mm small focus of sclerosis at this location. Additional small foci of sclerosis in several ribs. Increased size of a focus of sclerosis in the T12 vertebral body, 1.3 by 0.9 cm on image 81/6. CT ABDOMEN PELVIS FINDINGS Hepatobiliary: Unremarkable Pancreas: Unremarkable Spleen: Unremarkable Adrenals/Urinary Tract: Both adrenal glands appear normal. 1.8 cm in diameter fluid density lesion of the right kidney upper pole most compatible with a cyst. The kidneys appear otherwise unremarkable. Bladder unremarkable. Stomach/Bowel: Possible mild wall thickening in the rectum. Otherwise unremarkable. Vascular/Lymphatic: Aortoiliac atherosclerotic vascular disease. Left inguinal lymph node 1.2 cm in short axis, previously the same. Left  external iliac node 0.9 cm in short axis, formerly 1.4 cm. Reproductive:  Punctate calcifications in the prostate gland. Other: No supplemental non-categorized findings. Musculoskeletal: Postoperative findings in the lower lumbar spine. Graft harvesting site from the right iliac bone. Fused what right sacroiliac joint with at least partial bridging of the left sacroiliac joint. Large synovial herniation pit of the right hip. Inferior endplate sclerosis at L3 surrounding a Schmorl's node, image 74/5. IMPRESSION: 1. Enlarging sclerotic focus along the posteroinferior T6 vertebral body, [name]. Some of these were present and not significantly hypermetabolic on the prior PET-CT, but overall the appearance does raise concern for osseous metastatic disease. 2. Previous left pelvic adenopathy has significantly improved, with the left external iliac node at 0.9 cm in short axis, formerly 1.4 cm. 3. Other imaging findings of potential clinical significance: Aortic Atherosclerosis (ICD10-I70.0). Coronary atherosclerosis. Airway thickening is present, suggesting bronchitis or reactive airways disease. Possible wall thickening in the rectum, nonspecific. Electronically Signed   By: Van Clines M.D.   On: 04/28/2019 10:42   Ct Chest Wo Contrast  Result Date: 04/28/2019 CLINICAL DATA:  Prostate cancer restaging. EXAM: CT CHEST, ABDOMEN AND PELVIS WITHOUT CONTRAST TECHNIQUE: Multidetector CT imaging of the chest, abdomen and pelvis was performed following the standard protocol without IV contrast. COMPARISON:  PET-CT from 03/11/2018 FINDINGS: CT CHEST FINDINGS Cardiovascular: Descending thoracic aortic and left anterior descending coronary artery atherosclerotic calcification. Mediastinum/Nodes: Unremarkable Lungs/Pleura: Stable 3 by 4 mm lingular subpleural nodule on image 97/4. Airway thickening is present, suggesting bronchitis or reactive airways  disease. Musculoskeletal: Degenerated right AC joint. Wide left AC joint, query prior distal clavicular resection on the right. Degenerative right sternoclavicular arthropathy. Subtle sclerosis anteriorly in the right third rib on image 58/4, not readily appreciable on the prior exam subtle sclerosis posteriorly in the right second rib on image 39/4, not well appreciated on the prior exam. Mild sclerosis inferiorly in the left lateral third rib. Small amount of lateral cortical thickening of the left lateral sixth rib on image 106/4. There is sclerosis in the left T2 pedicle, image 110/6, not previously hypermetabolic. Sclerosis along the posteroinferior T6 vertebra measuring 1.3 by 1.1 cm on image 95/6, previously there was only about a 3 mm small focus of sclerosis at this location. Additional small foci of sclerosis in several ribs. Increased size of a focus of sclerosis in the T12 vertebral body, 1.3 by 0.9 cm on image 81/6. CT ABDOMEN PELVIS FINDINGS Hepatobiliary: Unremarkable Pancreas: Unremarkable Spleen: Unremarkable Adrenals/Urinary Tract: Both adrenal glands appear normal. 1.8 cm in diameter fluid density lesion of the right kidney upper pole most compatible with a cyst. The kidneys appear otherwise unremarkable. Bladder unremarkable. Stomach/Bowel: Possible mild wall thickening in the rectum. Otherwise unremarkable. Vascular/Lymphatic: Aortoiliac atherosclerotic vascular disease. Left inguinal lymph node 1.2 cm in short axis, previously the same. Left external iliac node 0.9 cm in short axis, formerly 1.4 cm. Reproductive: Punctate calcifications in the prostate gland. Other: No supplemental non-categorized findings. Musculoskeletal: Postoperative findings in the lower lumbar spine. Graft harvesting site from the right iliac bone. Fused what right sacroiliac joint with at least partial bridging of the left sacroiliac joint. Large synovial herniation pit of the right hip. Inferior endplate sclerosis at L3  surrounding a Schmorl's node, image 74/5. IMPRESSION: 1. Enlarging sclerotic focus along the posteroinferior T6 vertebral body, and enlarging sclerotic focus in the  T12 vertebral body. Small bilateral rib sclerotic foci and a sclerotic lesion in the left T2 vertebral pedicle. Some of these were present and not significantly hypermetabolic on the prior PET-CT, but overall the appearance does raise concern for osseous metastatic disease. 2. Previous left pelvic adenopathy has significantly improved, with the left external iliac node at 0.9 cm in short axis, formerly 1.4 cm. 3. Other imaging findings of potential clinical significance: Aortic Atherosclerosis (ICD10-I70.0). Coronary atherosclerosis. Airway thickening is present, suggesting bronchitis or reactive airways disease. Possible wall thickening in the rectum, nonspecific. Electronically Signed   By: Van Clines M.D.   On: 04/28/2019 10:42   Nm Bone Scan Whole Body  Result Date: 04/28/2019 CLINICAL DATA:  Prostate cancer. EXAM: NUCLEAR MEDICINE WHOLE BODY BONE SCAN TECHNIQUE: Whole body anterior and posterior images were obtained approximately 3 hours after intravenous injection of radiopharmaceutical. RADIOPHARMACEUTICALS:  22.5 mCi Technetium-80m MDP IV COMPARISON:  CT 04/28/2019 FINDINGS: Subtle foci of radiotracer activity within the anterolateral aspect of the LEFT and RIGHT third rib corresponds to sclerotic lesion on comparison CT and consistent with skeletal metastasis. Focal uptake in the T6 vertebral body also corresponds to sclerotic lesion on comparison CT. Focal uptake in the T1 region also corresponds to sclerotic lesion the pedicle on comparison CT. Focal uptake in the medial RIGHT clavicle may be degenerative. Probable degenerative uptake in LEFT acetabulum. IMPRESSION: 1. Focal uptake within the third ribs, T6 and T1 vertebral body correspond to sclerotic lesions on comparison CT and consistent with skeletal metastasis. 2. Probable  degenerative uptake in the RIGHT clavicle and LEFT acetabulum. Electronically Signed   By: Suzy Bouchard M.D.   On: 04/28/2019 17:24     ASSESSMENT & PLAN:  1. Prostate cancer ([place])   2. Androgen deprivation therapy   3. Hot flash in male   Cancer Staging Prostate cancer [facility]) Staging form: Prostate, AJCC 8th Edition - Clinical stage from 03/19/2018: Stage IVB (cTX, cN1, cM1b) - Signed by [name], MD on 05/01/2019  #Metastatic prostate Cancer, castration sensitive PSA 24.18--> 33.15-->52.75-->13.98 Status post 1 cycle of docetaxel. Overall she tolerates well. Chemotherapy is again held due to persistent high blood pressure. Patient is on losartan, and reports to be compliant. I will add a low-dose amlodipine 5 mg.  Encourage patient to follow-up with primary care provider for further adjustment.  Patient will be reevaluated In 1 week for chemotherapy.   #Androgen deprivation therapy Firmagon loading dose was 06/05/2019.  Patient is due for [place] next week.  Follow-up in 1 week for next cycle of chemotherapy.   [name], MD, PhD Hematology Oncology [facility] Pager- [phone] 06/27/2019

## 2019-06-29 ENCOUNTER — Inpatient Hospital Stay: Payer: Medicare HMO

## 2019-06-29 ENCOUNTER — Other Ambulatory Visit: Payer: Self-pay | Admitting: Oncology

## 2019-06-30 ENCOUNTER — Inpatient Hospital Stay (HOSPITAL_BASED_OUTPATIENT_CLINIC_OR_DEPARTMENT_OTHER): Payer: Medicare HMO | Admitting: Hospice and Palliative Medicine

## 2019-06-30 DIAGNOSIS — C61 Malignant neoplasm of prostate: Secondary | ICD-10-CM | POA: Diagnosis not present

## 2019-06-30 DIAGNOSIS — C7951 Secondary malignant neoplasm of bone: Secondary | ICD-10-CM | POA: Diagnosis not present

## 2019-06-30 DIAGNOSIS — Z515 Encounter for palliative care: Secondary | ICD-10-CM

## 2019-06-30 NOTE — Progress Notes (Signed)
Virtual Visit via Telephone Note  I connected with Terry Mitchell on 06/30/19 at  1:00 PM EST by telephone and verified that I am speaking with the correct person using two identifiers.   I discussed the limitations, risks, security and privacy concerns of performing an evaluation and management service by telephone and the availability of in person appointments. I also discussed with the patient that there may be a patient responsible charge related to this service. The patient expressed understanding and agreed to proceed.   History of Present Illness: Palliative Care consult requested for this 68 y.o. male with multiple medical problems including stage IV prostate cancer metastatic to bone (diagnosed August 2019) status post XRT and on treatment with chemotherapy.  He was referred to palliative care to help address goals and manage ongoing symptoms.   Observations/Objective: I called and spoke with patient by phone.   He reports that he is doing well. He denies any acute changes or concerns today. He also denies any issues with treatment or medications. He feels he is eating well. Weight is actually up about 5lbs over the past month. No distressing symptoms reported today.   Assessment and Plan: Stage IV prostate cancer - on treatment with docetaxel. Unfortunately, patient has been unable to receive treatment recently due to hypertension. He was started on losartan and reports compliance with his medications.   GOC - seem aligned with current scope of treatment. Will benefit from future conversations regarding goals/ACP.  Follow Up Instructions: Follow up telephone visit in 3-4 weeks   I discussed the assessment and treatment plan with the patient. The patient was provided an opportunity to ask questions and all were answered. The patient agreed with the plan and demonstrated an understanding of the instructions.   The patient was advised to call back or seek an in-person evaluation if  the symptoms worsen or if the condition fails to improve as anticipated.  I provided 8 minutes of non-face-to-face time during this encounter.   Irean Hong, NP

## 2019-07-03 ENCOUNTER — Inpatient Hospital Stay: Payer: Medicare HMO

## 2019-07-03 ENCOUNTER — Other Ambulatory Visit: Payer: Self-pay

## 2019-07-03 ENCOUNTER — Inpatient Hospital Stay (HOSPITAL_BASED_OUTPATIENT_CLINIC_OR_DEPARTMENT_OTHER): Payer: Medicare HMO | Admitting: Oncology

## 2019-07-03 ENCOUNTER — Encounter: Payer: Self-pay | Admitting: Oncology

## 2019-07-03 ENCOUNTER — Ambulatory Visit: Payer: Medicare HMO

## 2019-07-03 VITALS — BP 180/90 | HR 60 | Temp 97.0°F | Resp 18

## 2019-07-03 VITALS — BP 162/90 | Temp 96.1°F | Resp 18 | Wt 213.5 lb

## 2019-07-03 DIAGNOSIS — C61 Malignant neoplasm of prostate: Secondary | ICD-10-CM

## 2019-07-03 DIAGNOSIS — R232 Flushing: Secondary | ICD-10-CM | POA: Diagnosis not present

## 2019-07-03 DIAGNOSIS — Z79818 Long term (current) use of other agents affecting estrogen receptors and estrogen levels: Secondary | ICD-10-CM

## 2019-07-03 DIAGNOSIS — R972 Elevated prostate specific antigen [PSA]: Secondary | ICD-10-CM

## 2019-07-03 DIAGNOSIS — Z5111 Encounter for antineoplastic chemotherapy: Secondary | ICD-10-CM | POA: Diagnosis not present

## 2019-07-03 DIAGNOSIS — D539 Nutritional anemia, unspecified: Secondary | ICD-10-CM

## 2019-07-03 DIAGNOSIS — E538 Deficiency of other specified B group vitamins: Secondary | ICD-10-CM | POA: Diagnosis not present

## 2019-07-03 DIAGNOSIS — C7951 Secondary malignant neoplasm of bone: Secondary | ICD-10-CM

## 2019-07-03 DIAGNOSIS — N1832 Chronic kidney disease, stage 3b: Secondary | ICD-10-CM

## 2019-07-03 LAB — COMPREHENSIVE METABOLIC PANEL
ALT: 18 U/L (ref 0–44)
AST: 25 U/L (ref 15–41)
Albumin: 4.1 g/dL (ref 3.5–5.0)
Alkaline Phosphatase: 83 U/L (ref 38–126)
Anion gap: 10 (ref 5–15)
BUN: 38 mg/dL — ABNORMAL HIGH (ref 8–23)
CO2: 23 mmol/L (ref 22–32)
Calcium: 9.5 mg/dL (ref 8.9–10.3)
Chloride: 104 mmol/L (ref 98–111)
Creatinine, Ser: 1.63 mg/dL — ABNORMAL HIGH (ref 0.61–1.24)
GFR calc Af Amer: 50 mL/min — ABNORMAL LOW (ref >60)
GFR calc non Af Amer: 43 mL/min — ABNORMAL LOW (ref >60)
Glucose, Bld: 140 mg/dL — ABNORMAL HIGH (ref 70–99)
Potassium: 4.3 mmol/L (ref 3.5–5.1)
Sodium: 137 mmol/L (ref 135–145)
Total Bilirubin: 0.8 mg/dL (ref 0.3–1.2)
Total Protein: 7.4 g/dL (ref 6.5–8.1)

## 2019-07-03 LAB — CBC WITH DIFFERENTIAL/PLATELET
Abs Immature Granulocytes: 0.05 10*3/uL (ref 0.00–0.07)
Basophils Absolute: 0 10*3/uL (ref 0.0–0.1)
Basophils Relative: 0 %
Eosinophils Absolute: 0 10*3/uL (ref 0.0–0.5)
Eosinophils Relative: 0 %
HCT: 35.8 % — ABNORMAL LOW (ref 39.0–52.0)
Hemoglobin: 11.5 g/dL — ABNORMAL LOW (ref 13.0–17.0)
Immature Granulocytes: 1 %
Lymphocytes Relative: 5 %
Lymphs Abs: 0.4 10*3/uL — ABNORMAL LOW (ref 0.7–4.0)
MCH: 32.5 pg (ref 26.0–34.0)
MCHC: 32.1 g/dL (ref 30.0–36.0)
MCV: 101.1 fL — ABNORMAL HIGH (ref 80.0–100.0)
Monocytes Absolute: 0.5 10*3/uL (ref 0.1–1.0)
Monocytes Relative: 8 %
Neutro Abs: 6.2 10*3/uL (ref 1.7–7.7)
Neutrophils Relative %: 86 %
Platelets: 160 10*3/uL (ref 150–400)
RBC: 3.54 MIL/uL — ABNORMAL LOW (ref 4.22–5.81)
RDW: 13.2 % (ref 11.5–15.5)
WBC: 7.2 10*3/uL (ref 4.0–10.5)
nRBC: 0 % (ref 0.0–0.2)

## 2019-07-03 MED ORDER — SODIUM CHLORIDE 0.9 % IV SOLN
75.0000 mg/m2 | Freq: Once | INTRAVENOUS | Status: AC
  Administered 2019-07-03: 11:00:00 160 mg via INTRAVENOUS
  Filled 2019-07-03: qty 16

## 2019-07-03 MED ORDER — DEXAMETHASONE SODIUM PHOSPHATE 10 MG/ML IJ SOLN: 10.0000 mg | Freq: Once | INTRAMUSCULAR | Status: DC

## 2019-07-03 MED ORDER — DEXAMETHASONE SODIUM PHOSPHATE 10 MG/ML IJ SOLN
10.0000 mg | Freq: Once | INTRAMUSCULAR | Status: DC
  Filled 2019-07-03: qty 1

## 2019-07-03 MED ORDER — DEXAMETHASONE SODIUM PHOSPHATE 10 MG/ML IJ SOLN
10.0000 mg | Freq: Once | INTRAMUSCULAR | Status: AC
  Administered 2019-07-03: 11:00:00 10 mg via INTRAVENOUS

## 2019-07-03 MED ORDER — SODIUM CHLORIDE 0.9 % IV SOLN
Freq: Once | INTRAVENOUS | Status: AC
  Administered 2019-07-03: 10:00:00 via INTRAVENOUS
  Filled 2019-07-03: qty 250

## 2019-07-03 MED ORDER — LEUPROLIDE ACETATE (3 MONTH) 22.5 MG ~~LOC~~ KIT
22.5000 mg | PACK | Freq: Once | SUBCUTANEOUS | Status: AC
  Administered 2019-07-03: 22.5 mg via SUBCUTANEOUS
  Filled 2019-07-03: qty 22.5

## 2019-07-03 NOTE — Progress Notes (Signed)
Cr 1.63, ok to proceed per MD

## 2019-07-03 NOTE — Progress Notes (Signed)
Taxotere infusion finished at 1156. Vital signs were checked at 1209- T-97, BP-180/90, HR-60, RR-18. Pt is asymptomatic and has no complaints at this time. Pt states that he took his BP medications prior to visit and that he has consulted his PCP regarding his BP medications. Pt has no complaints at this time. MD notified of vitals post infusion. Per MD- pt may be discharged and to consult with his PCP regarding his BP medications. Pt was educated on the importance of checking/monitoring his BP at home and if any complications occur at home to call the clinic and if it is an emergency to call 911. Pt verbalizes understanding, has no complaints, and all questions answered at this time. Pt stable and discharged at 1220.   Rachele Lamaster [employer]

## 2019-07-03 NOTE — Progress Notes (Signed)
Patient here for follow up. States he has been taking blood pressure medication as directed.

## 2019-07-04 NOTE — Progress Notes (Signed)
Hematology/Oncology Follow up note [name] [phone] Fax:([phone]   Patient Care Team: [name], MD as PCP - General (Family Medicine)  REFERRING PROVIDER: [facility] REASON FOR VISIT:  Reestablish care for prostate cancer.  HISTORY OF PRESENTING ILLNESS:  Terry Mitchell is a  68 y.o.  male with PMH listed below who was referred to me for evaluation of elevated PSA. Patient was referred by primary care physician to urology due to elevated PSA at the level of 72.  Per note Patient was referred to urology for further management.  Staging imaging has been ordered including abdominal and pelvis CT with contrast and bone scan.  Patient was also given Mills Koller for treatment of clinical prostate cancer.  Patient reports that he was scheduled to return to urologist office for biopsy. Patient denies any dysuria, hematuria, fever or chills.  He lives by himself. Denies any bone pain.  Reports some soreness at the area of [place] shots, as well as hot flushes.  Otherwise no complaints.  #01/01/2018 Patient received Firmagon loading dose at urologist office  # patient had a prostate biopsy on 02/28/2018 pathology showed prostate adenocarcinoma with androgen deprivation treatment effect.  Adenocarcinoma is present in all core fragments with involvement of 50% or more of each core.  Perineural invasion is present. As patient has received Firmagon prior to biopsy, Gleason grade cannot be reliably assessed after androgen deprivation therapy.  #  discussed with patient's urologist Dr. Bernardo Heater who did patient's prostate biopsy.  He agrees that given Mills Koller prior to biopsy and staging images are not typical.  He agrees that PET scan is consistent with lymph node involvement.  He does not think patient is a good candidate for radical prostatectomy.  He agrees with me about the plan that definitive radiation is a good option.  # September 2019   external Beam radiation. He was scheduled to have I-125 interstitial implant although that can not been done so patient underwent external beam IMRT prostate boost, finished in January 2020.  # patient was on androgen deprivation therapy with Firmagon injections, until August 2020. Declined ADT due to vasomotor symptoms.   #Blood work from 04/14/2019 showed testosterone level of 284, PSA has been decreased to 24.18.  # 04/28/2019 CT chest abdomen pelvis and bone scan images were independently reviewed by me and discussed with patient. Images are consistent with metastatic prostate cancer.  INTERVAL HISTORY Terry Mitchell is a 68 y.o. male who has above history reviewed by me today presents for follow-up visit for management of Stage IVA (cTX, cN1, cM1) castration sensitive metastatic prostate cancer Patient presents for evaluation prior to chemotherapy. Patient's chemotherapy has been delayed due to uncontrolled hypertension. I added Norvasc 5 mg daily.  Patient has been taking Norvasc.  Blood pressure seems to be better controlled. Chronic intermittent hot flash.  Unchanged. Chronic fatigue unchanged.  Review of Systems  Constitutional: Positive for fatigue. Negative for appetite change, chills, fever and unexpected weight change.  HENT:   Negative for hearing loss and voice change.   Eyes: Negative for eye problems and icterus.  Respiratory: Negative for chest tightness, cough and shortness of breath.   Cardiovascular: Negative for chest pain and leg swelling.  Gastrointestinal: Negative for abdominal distention and abdominal pain.  Endocrine: Positive for hot flashes.  Genitourinary: Negative for difficulty urinating, dysuria and frequency.   Musculoskeletal: Negative for arthralgias.  Skin: Negative for itching and rash.  Neurological: Negative for light-headedness and numbness.  Hematological: Negative for adenopathy. Does not bruise/bleed easily.  Psychiatric/Behavioral: Negative  for confusion.     MEDICAL HISTORY:  Past Medical History:  Diagnosis Date  . Depression    recently went on disability d/t diagnosis  . History of recent fall 01/2018   missed the last step on ladder, causing back discomfort  . Hypertension   . Prostate cancer ([place]) 12/2017   prostate    SURGICAL HISTORY: Past Surgical History:  Procedure Laterality Date  . BACK SURGERY  2001    2 rods and 6 screws in back  . Left shoulder surgery Left 1998   removed a piece of bone around collar bone  . PROSTATE BIOPSY N/A 02/28/2018   Procedure: PROSTATE BIOPSY;  Surgeon: Abbie Sons, MD;  Location: [facility] ORS;  Service: Urology;  Laterality: N/A;  . TRANSRECTAL ULTRASOUND N/A 02/28/2018   Procedure: TRANSRECTAL ULTRASOUND;  Surgeon: Abbie Sons, MD;  Location: [facility] ORS;  Service: Urology;  Laterality: N/A;    SOCIAL HISTORY: Social History   Socioeconomic History  . Marital status: Single    Spouse name: Not on file  . Number of children: 4  . Years of education: Not on file  . Highest education level: Not on file  Occupational History  . Occupation: disability  Tobacco Use  . Smoking status: Current Every Day Smoker    Packs/day: 2.00    Types: Cigars  . Smokeless tobacco: Never Used  . Tobacco comment: 2 cigars  Substance and Sexual Activity  . Alcohol use: Yes    Alcohol/week: 9.0 standard drinks    Types: 9 Cans of beer per week    Comment: occasional  . Drug use: Yes    Types: Cocaine, "Crack" cocaine    Comment: last use about 3 days ago   . Sexual activity: Not Currently  Other Topics Concern  . Not on file  Social History Narrative  . Not on file   Social Determinants of Health   Financial Resource Strain:   . Difficulty of Paying Living Expenses: Not on file  Food Insecurity:   . Worried About Charity fundraiser in the Last Year: Not on file  . Ran Out of Food in the Last Year: Not on file  Transportation Needs:   . Lack of Transportation  (Medical): Not on file  . Lack of Transportation (Non-Medical): Not on file  Physical Activity:   . Days of Exercise per Week: Not on file  . Minutes of Exercise per Session: Not on file  Stress:   . Feeling of Stress : Not on file  Social Connections:   . Frequency of Communication with Friends and Family: Not on file  . Frequency of Social Gatherings with Friends and Family: Not on file  . Attends Religious Services: Not on file  . Active Member of Clubs or Organizations: Not on file  . Attends Archivist Meetings: Not on file  . Marital Status: Not on file  Intimate Partner Violence:   . Fear of Current or Ex-Partner: Not on file  . Emotionally Abused: Not on file  . Physically Abused: Not on file  . Sexually Abused: Not on file    FAMILY HISTORY: Family History  Problem Relation Age of Onset  . Stroke Mother   . Kidney failure Mother   . Diabetes Maternal Aunt     ALLERGIES:  is allergic to penicillins and lactose intolerance (gi).  MEDICATIONS:  Current Outpatient Medications  Medication Sig  Dispense Refill  . albuterol (VENTOLIN HFA) 108 (90 Base) MCG/ACT inhaler Inhale 2 puffs into the lungs every 6 (six) hours as needed for wheezing or shortness of breath. 6.7 g 0  . amLODipine (NORVASC) 5 MG tablet Take 1 tablet (5 mg total) by mouth daily. 30 tablet 0  . dexamethasone (DECADRON) 4 MG tablet Take 2 tablets (8 mg total) by mouth 2 (two) times daily. Start the day before Taxotere. Then daily after chemo for 2 days. 30 tablet 1  . loratadine (CLARITIN) 10 MG tablet Take 10 mg by mouth daily. In prep for fulphila    . losartan (COZAAR) 50 MG tablet Take 50 mg by mouth daily after breakfast.   2  . ondansetron (ZOFRAN) 8 MG tablet Take 1 tablet (8 mg total) by mouth 2 (two) times daily as needed for refractory nausea / vomiting. 30 tablet 1  . prochlorperazine (COMPAZINE) 10 MG tablet Take 1 tablet (10 mg total) by mouth every 6 (six) hours as needed (Nausea or  vomiting). 30 tablet 1  . vitamin B-12 (CYANOCOBALAMIN) 1000 MCG tablet Take 1 tablet (1,000 mcg total) by mouth daily. 90 tablet 1  . venlafaxine (EFFEXOR) 37.5 MG tablet Take 1 tablet (37.5 mg total) by mouth daily. (Patient not taking: Reported on 06/05/2019) 30 tablet 0   No current facility-administered medications for this visit.   Facility-Administered Medications Ordered in Other Visits  Medication Dose Route Frequency Provider Last Rate Last Admin  . sodium chloride flush (NS) 0.9 % injection 10 mL  10 mL Intravenous Once [name], MD         PHYSICAL EXAMINATION: ECOG PERFORMANCE STATUS: 1 - Symptomatic but completely ambulatory Vitals:   07/03/19 0852  BP: (!) 162/90  Resp: 18  Temp: (!) 96.1 F (35.6 C)   Filed Weights   07/03/19 0852  Weight: 213 lb 8 oz (96.8 kg)    Physical Exam Constitutional:      General: He is not in acute distress. HENT:     Head: Normocephalic and atraumatic.  Eyes:     General: No scleral icterus.    Conjunctiva/sclera: Conjunctivae normal.     Pupils: Pupils are equal, round, and reactive to light.  Cardiovascular:     Rate and Rhythm: Normal rate and regular rhythm.     Heart sounds: Normal heart sounds.  Pulmonary:     Effort: Pulmonary effort is normal. No respiratory distress.     Breath sounds: No wheezing.  Abdominal:     General: Bowel sounds are normal. There is no distension.     Palpations: Abdomen is soft. There is no mass.     Tenderness: There is no abdominal tenderness.  Musculoskeletal:        General: No deformity. Normal range of motion.     Cervical back: Normal range of motion and neck supple.  Lymphadenopathy:     Cervical: No cervical adenopathy.  Skin:    General: Skin is warm and dry.     Findings: No erythema or rash.  Neurological:     Mental Status: He is alert and oriented to person, place, and time.     Cranial Nerves: No cranial nerve deficit.     Coordination: Coordination normal.    Psychiatric:        Behavior: Behavior normal.        Thought Content: Thought content normal.      LABORATORY DATA:  I have reviewed the data as listed Lab Results  Component Value Date   WBC 7.2 07/03/2019   HGB 11.5 (L) 07/03/2019   HCT 35.8 (L) 07/03/2019   MCV 101.1 (H) 07/03/2019   PLT 160 07/03/2019   Recent Labs    06/05/19 0805 06/26/19 0844 07/03/19 0828  NA 138 138 137  K 4.6 4.4 4.3  CL 106 105 104  CO2 23 20* 23  GLUCOSE 139* 116* 140*  BUN 28* 45* 38*  CREATININE 1.44* 1.57* 1.63*  CALCIUM 9.3 9.3 9.5  GFRNONAA 50* 45* 43*  GFRAA 58* 52* 50*  PROT 7.3 7.3 7.4  ALBUMIN 4.1 4.4 4.1  AST 33 29 25  ALT 19 18 18   ALKPHOS 93 101 83  BILITOT 0.6 0.9 0.8   Iron/TIBC/Ferritin/ %Sat No results found for: IRON, TIBC, FERRITIN, IRONPCTSAT   RADIOGRAPHIC STUDIES: I have personally reviewed the radiological images as listed and agreed with the findings in the report. PET scan  Patient had an enlarged left external iliac node measuring 1.4 cm in short axis with maximum SUV 2.5.  Although activity level is relatively low, given the size of the lymph node suspicion is raised for the possibility of metastatic disease. There is also a 7 mm right external iliac lymph node and a 1.2 cm left inguinal lymph node demonstrate only low metabolic activity.  Not overtly enlarged.  [name], faint sclerosis in the right anterior sixth rib.  Neither has significant metabolic activity.  Significance uncertain. Benign-appearing rib fractures with associated low-grade activity along several right anterior ribs include the right ninth rib. Activity in the cecum without associated CT abnormality.  Most likely benign. CT Abdomen Pelvis Wo Contrast  Result Date: 04/28/2019 CLINICAL DATA:  Prostate cancer restaging. EXAM: CT CHEST, ABDOMEN AND PELVIS WITHOUT CONTRAST TECHNIQUE: Multidetector CT imaging of the chest, abdomen and  pelvis was performed following the standard protocol without IV contrast. COMPARISON:  PET-CT from 03/11/2018 FINDINGS: CT CHEST FINDINGS Cardiovascular: Descending thoracic aortic and left anterior descending coronary artery atherosclerotic calcification. Mediastinum/Nodes: Unremarkable Lungs/Pleura: Stable 3 by 4 mm lingular subpleural nodule on image 97/4. Airway thickening is present, suggesting bronchitis or reactive airways disease. Musculoskeletal: Degenerated right AC joint. Wide left AC joint, query prior distal clavicular resection on the right. Degenerative right sternoclavicular arthropathy. Subtle sclerosis anteriorly in the right third rib on image 58/4, not readily appreciable on the prior exam subtle sclerosis posteriorly in the right second rib on image 39/4, not well appreciated on the prior exam. Mild sclerosis inferiorly in the left lateral third rib. Small amount of lateral cortical thickening of the left lateral sixth rib on image 106/4. There is sclerosis in the left T2 pedicle, image 110/6, not previously hypermetabolic. Sclerosis along the posteroinferior T6 vertebra measuring 1.3 by 1.1 cm on image 95/6, previously there was only about a 3 mm small focus of sclerosis at this location. Additional small foci of sclerosis in several ribs. Increased size of a focus of sclerosis in the T12 vertebral body, 1.3 by 0.9 cm on image 81/6. CT ABDOMEN PELVIS FINDINGS Hepatobiliary: Unremarkable Pancreas: Unremarkable Spleen: Unremarkable Adrenals/Urinary Tract: Both adrenal glands appear normal. 1.8 cm in diameter fluid density lesion of the right kidney upper pole most compatible with a cyst. The kidneys appear otherwise unremarkable. Bladder unremarkable. Stomach/Bowel: Possible mild wall thickening in the rectum. Otherwise unremarkable. Vascular/Lymphatic: Aortoiliac atherosclerotic vascular disease. Left inguinal lymph node 1.2 cm in short axis, previously the same. Left external iliac node 0.9 cm  in short axis, formerly 1.4 cm. Reproductive: Punctate calcifications in the prostate gland. Other: No supplemental non-categorized findings. Musculoskeletal: Postoperative findings in the lower lumbar spine. Graft harvesting site from the right iliac bone. Fused what right sacroiliac joint with at least partial bridging of the left sacroiliac joint. Large synovial herniation pit of the right hip. Inferior endplate sclerosis at L3 surrounding a Schmorl's node, image 74/5. IMPRESSION: 1. Enlarging sclerotic focus along the posteroinferior T6 vertebral body, [name]. Some of these were present and not significantly hypermetabolic on the prior PET-CT, but overall the appearance does raise concern for osseous metastatic disease. 2. Previous left pelvic adenopathy has significantly improved, with the left external iliac node at 0.9 cm in short axis, formerly 1.4 cm. 3. Other imaging findings of potential clinical significance: Aortic Atherosclerosis (ICD10-I70.0). Coronary atherosclerosis. Airway thickening is present, suggesting bronchitis or reactive airways disease. Possible wall thickening in the rectum, nonspecific. Electronically Signed   By: Van Clines M.D.   On: 04/28/2019 10:42   CT CHEST WO CONTRAST  Result Date: 04/28/2019 CLINICAL DATA:  Prostate cancer restaging. EXAM: CT CHEST, ABDOMEN AND PELVIS WITHOUT CONTRAST TECHNIQUE: Multidetector CT imaging of the chest, abdomen and pelvis was performed following the standard protocol without IV contrast. COMPARISON:  PET-CT from 03/11/2018 FINDINGS: CT CHEST FINDINGS Cardiovascular: Descending thoracic aortic and left anterior descending coronary artery atherosclerotic calcification. Mediastinum/Nodes: Unremarkable Lungs/Pleura: Stable 3 by 4 mm lingular subpleural nodule on image 97/4. Airway thickening is present,  suggesting bronchitis or reactive airways disease. Musculoskeletal: Degenerated right AC joint. Wide left AC joint, query prior distal clavicular resection on the right. Degenerative right sternoclavicular arthropathy. Subtle sclerosis anteriorly in the right third rib on image 58/4, not readily appreciable on the prior exam subtle sclerosis posteriorly in the right second rib on image 39/4, not well appreciated on the prior exam. Mild sclerosis inferiorly in the left lateral third rib. Small amount of lateral cortical thickening of the left lateral sixth rib on image 106/4. There is sclerosis in the left T2 pedicle, image 110/6, not previously hypermetabolic. Sclerosis along the posteroinferior T6 vertebra measuring 1.3 by 1.1 cm on image 95/6, previously there was only about a 3 mm small focus of sclerosis at this location. Additional small foci of sclerosis in several ribs. Increased size of a focus of sclerosis in the T12 vertebral body, 1.3 by 0.9 cm on image 81/6. CT ABDOMEN PELVIS FINDINGS Hepatobiliary: Unremarkable Pancreas: Unremarkable Spleen: Unremarkable Adrenals/Urinary Tract: Both adrenal glands appear normal. 1.8 cm in diameter fluid density lesion of the right kidney upper pole most compatible with a cyst. The kidneys appear otherwise unremarkable. Bladder unremarkable. Stomach/Bowel: Possible mild wall thickening in the rectum. Otherwise unremarkable. Vascular/Lymphatic: Aortoiliac atherosclerotic vascular disease. Left inguinal lymph node 1.2 cm in short axis, previously the same. Left external iliac node 0.9 cm in short axis, formerly 1.4 cm. Reproductive: Punctate calcifications in the prostate gland. Other: No supplemental non-categorized findings. Musculoskeletal: Postoperative findings in the lower lumbar spine. Graft harvesting site from the right iliac bone. Fused what right sacroiliac joint with at least partial bridging of the left sacroiliac joint. Large synovial herniation pit of the  right hip. Inferior endplate sclerosis at L3 surrounding a Schmorl's node, image 74/5. IMPRESSION: 1. Enlarging sclerotic focus along the posteroinferior T6 vertebral body, and enlarging sclerotic focus in the T12 vertebral body. Small bilateral rib  sclerotic foci and a sclerotic lesion in the left T2 vertebral pedicle. Some of these were present and not significantly hypermetabolic on the prior PET-CT, but overall the appearance does raise concern for osseous metastatic disease. 2. Previous left pelvic adenopathy has significantly improved, with the left external iliac node at 0.9 cm in short axis, formerly 1.4 cm. 3. Other imaging findings of potential clinical significance: Aortic Atherosclerosis (ICD10-I70.0). Coronary atherosclerosis. Airway thickening is present, suggesting bronchitis or reactive airways disease. Possible wall thickening in the rectum, nonspecific. Electronically Signed   By: Van Clines M.D.   On: 04/28/2019 10:42   NM Bone Scan Whole Body  Result Date: 04/28/2019 CLINICAL DATA:  Prostate cancer. EXAM: NUCLEAR MEDICINE WHOLE BODY BONE SCAN TECHNIQUE: Whole body anterior and posterior images were obtained approximately 3 hours after intravenous injection of radiopharmaceutical. RADIOPHARMACEUTICALS:  22.5 mCi Technetium-20m MDP IV COMPARISON:  CT 04/28/2019 FINDINGS: Subtle foci of radiotracer activity within the anterolateral aspect of the LEFT and RIGHT third rib corresponds to sclerotic lesion on comparison CT and consistent with skeletal metastasis. Focal uptake in the T6 vertebral body also corresponds to sclerotic lesion on comparison CT. Focal uptake in the T1 region also corresponds to sclerotic lesion the pedicle on comparison CT. Focal uptake in the medial RIGHT clavicle may be degenerative. Probable degenerative uptake in LEFT acetabulum. IMPRESSION: 1. Focal uptake within the third ribs, T6 and T1 vertebral body correspond to sclerotic lesions on comparison CT and  consistent with skeletal metastasis. 2. Probable degenerative uptake in the RIGHT clavicle and LEFT acetabulum. Electronically Signed   By: Suzy Bouchard M.D.   On: 04/28/2019 17:24     ASSESSMENT & PLAN:  1. Prostate cancer metastatic to bone ([place])   2. Androgen deprivation therapy   3. Hot flash in male   4. B12 deficiency   5. Macrocytic anemia   Cancer Staging Prostate cancer [facility]) Staging form: Prostate, AJCC 8th Edition - Clinical stage from 03/19/2018: Stage IVB (cTX, cN1, cM1b) - Signed by [name], MD on 05/01/2019  #Metastatic prostate Cancer, castration sensitive PSA 24.18--> 33.15-->52.75-->13.98 Status post 1 cycle of docetaxel. Labs reviewed and discussed with patient. Counts are acceptable to proceed with cycle 2 docetaxel.  Patient will receive growth factor on day 4.  #Uncontrolled blood pressure, currently on losartan.  Amlodipine was recently added.  Blood pressure improved. #Androgen deprivation therapy, patient will receive Lupron 22.5 mg x 1.  #History of vitamin B12 deficiency.  Previously received parenteral B12 injections.  On vitamin B12 supplementation.  B12 level has improved.  Continue to monitor. #CKD, creatinine 1.63.  Continue to monitor.  Avoid nephrotoxins.  Follow-up in 3 weeks for next cycle of chemotherapy.   [name], MD, PhD Hematology Oncology [facility] at [facility] Pager- [phone] 07/04/2019

## 2019-07-06 ENCOUNTER — Other Ambulatory Visit: Payer: Self-pay

## 2019-07-06 ENCOUNTER — Inpatient Hospital Stay: Payer: Medicare HMO

## 2019-07-06 DIAGNOSIS — C61 Malignant neoplasm of prostate: Secondary | ICD-10-CM

## 2019-07-06 DIAGNOSIS — Z5111 Encounter for antineoplastic chemotherapy: Secondary | ICD-10-CM | POA: Diagnosis not present

## 2019-07-06 MED ORDER — PEGFILGRASTIM-JMDB 6 MG/0.6ML ~~LOC~~ SOSY
6.0000 mg | PREFILLED_SYRINGE | Freq: Once | SUBCUTANEOUS | Status: AC
  Administered 2019-07-06: 6 mg via SUBCUTANEOUS
  Filled 2019-07-06: qty 0.6

## 2019-07-22 ENCOUNTER — Inpatient Hospital Stay (HOSPITAL_BASED_OUTPATIENT_CLINIC_OR_DEPARTMENT_OTHER): Payer: Medicare HMO | Admitting: Hospice and Palliative Medicine

## 2019-07-22 DIAGNOSIS — C61 Malignant neoplasm of prostate: Secondary | ICD-10-CM

## 2019-07-22 DIAGNOSIS — Z515 Encounter for palliative care: Secondary | ICD-10-CM | POA: Diagnosis not present

## 2019-07-22 NOTE — Progress Notes (Signed)
Virtual Visit via Telephone Note  I connected with Terry Mitchell on 07/22/19 at  1:00 PM EST by telephone and verified that I am speaking with the correct person using two identifiers.   I discussed the limitations, risks, security and privacy concerns of performing an evaluation and management service by telephone and the availability of in person appointments. I also discussed with the patient that there may be a patient responsible charge related to this service. The patient expressed understanding and agreed to proceed.   History of Present Illness: Palliative Care consult requested for this 68 y.o. male with multiple medical problems including stage IV prostate cancer metastatic to bone (diagnosed August 2019) status post XRT and on treatment with chemotherapy.  He was referred to palliative care to help address goals and manage ongoing symptoms.   Observations/Objective: I called and spoke with patient by phone.   Patient reports that he is doing about the same.  He denies any significant changes or concerns.  No questions today.  Does report occasional loose stools but denies persistent diarrhea.  Likely treatment related.  Would recommend antidiarrheals if needed.  Appetite is reportedly good.  No changes in performance as.  Assessment and Plan: Stage IV prostate cancer - on treatment with docetaxel.  Likely with chemotherapy related diarrhea.  Recommended antidiarrheals as needed  GOC - seem aligned with current scope of treatment. Will benefit from future conversations regarding goals/ACP.  Follow Up Instructions: Follow up telephone visit in 3-4 weeks   I discussed the assessment and treatment plan with the patient. The patient was provided an opportunity to ask questions and all were answered. The patient agreed with the plan and demonstrated an understanding of the instructions.   The patient was advised to call back or seek an in-person evaluation if the symptoms worsen or  if the condition fails to improve as anticipated.  I provided 8 minutes of non-face-to-face time during this encounter.   Irean Hong, NP

## 2019-07-27 ENCOUNTER — Inpatient Hospital Stay: Payer: Medicare HMO

## 2019-07-27 ENCOUNTER — Encounter: Payer: Self-pay | Admitting: Oncology

## 2019-07-27 ENCOUNTER — Inpatient Hospital Stay: Payer: Medicare HMO | Attending: Oncology

## 2019-07-27 ENCOUNTER — Inpatient Hospital Stay (HOSPITAL_BASED_OUTPATIENT_CLINIC_OR_DEPARTMENT_OTHER): Payer: Medicare HMO | Admitting: Oncology

## 2019-07-27 ENCOUNTER — Other Ambulatory Visit: Payer: Self-pay

## 2019-07-27 VITALS — BP 149/71 | HR 64 | Temp 97.6°F | Resp 18 | Wt 209.5 lb

## 2019-07-27 DIAGNOSIS — N1832 Chronic kidney disease, stage 3b: Secondary | ICD-10-CM | POA: Insufficient documentation

## 2019-07-27 DIAGNOSIS — C61 Malignant neoplasm of prostate: Secondary | ICD-10-CM

## 2019-07-27 DIAGNOSIS — R05 Cough: Secondary | ICD-10-CM | POA: Insufficient documentation

## 2019-07-27 DIAGNOSIS — Z79818 Long term (current) use of other agents affecting estrogen receptors and estrogen levels: Secondary | ICD-10-CM | POA: Diagnosis not present

## 2019-07-27 DIAGNOSIS — Z5189 Encounter for other specified aftercare: Secondary | ICD-10-CM | POA: Insufficient documentation

## 2019-07-27 DIAGNOSIS — Z7289 Other problems related to lifestyle: Secondary | ICD-10-CM | POA: Insufficient documentation

## 2019-07-27 DIAGNOSIS — C7951 Secondary malignant neoplasm of bone: Secondary | ICD-10-CM | POA: Insufficient documentation

## 2019-07-27 DIAGNOSIS — Z841 Family history of disorders of kidney and ureter: Secondary | ICD-10-CM | POA: Diagnosis not present

## 2019-07-27 DIAGNOSIS — F17210 Nicotine dependence, cigarettes, uncomplicated: Secondary | ICD-10-CM | POA: Diagnosis not present

## 2019-07-27 DIAGNOSIS — D649 Anemia, unspecified: Secondary | ICD-10-CM | POA: Diagnosis not present

## 2019-07-27 DIAGNOSIS — Z79899 Other long term (current) drug therapy: Secondary | ICD-10-CM | POA: Insufficient documentation

## 2019-07-27 DIAGNOSIS — Z823 Family history of stroke: Secondary | ICD-10-CM | POA: Diagnosis not present

## 2019-07-27 DIAGNOSIS — R232 Flushing: Secondary | ICD-10-CM | POA: Insufficient documentation

## 2019-07-27 DIAGNOSIS — Z833 Family history of diabetes mellitus: Secondary | ICD-10-CM | POA: Diagnosis not present

## 2019-07-27 DIAGNOSIS — Z5111 Encounter for antineoplastic chemotherapy: Secondary | ICD-10-CM | POA: Insufficient documentation

## 2019-07-27 DIAGNOSIS — E876 Hypokalemia: Secondary | ICD-10-CM | POA: Insufficient documentation

## 2019-07-27 DIAGNOSIS — R5383 Other fatigue: Secondary | ICD-10-CM | POA: Insufficient documentation

## 2019-07-27 DIAGNOSIS — Z880 Allergy status to penicillin: Secondary | ICD-10-CM | POA: Insufficient documentation

## 2019-07-27 DIAGNOSIS — D539 Nutritional anemia, unspecified: Secondary | ICD-10-CM

## 2019-07-27 LAB — COMPREHENSIVE METABOLIC PANEL
ALT: 19 U/L (ref 0–44)
AST: 24 U/L (ref 15–41)
Albumin: 3.8 g/dL (ref 3.5–5.0)
Alkaline Phosphatase: 74 U/L (ref 38–126)
Anion gap: 10 (ref 5–15)
BUN: 25 mg/dL — ABNORMAL HIGH (ref 8–23)
CO2: 27 mmol/L (ref 22–32)
Calcium: 9.3 mg/dL (ref 8.9–10.3)
Chloride: 102 mmol/L (ref 98–111)
Creatinine, Ser: 1.47 mg/dL — ABNORMAL HIGH (ref 0.61–1.24)
GFR calc Af Amer: 57 mL/min — ABNORMAL LOW (ref >60)
GFR calc non Af Amer: 49 mL/min — ABNORMAL LOW (ref >60)
Glucose, Bld: 112 mg/dL — ABNORMAL HIGH (ref 70–99)
Potassium: 3.4 mmol/L — ABNORMAL LOW (ref 3.5–5.1)
Sodium: 139 mmol/L (ref 135–145)
Total Bilirubin: 1 mg/dL (ref 0.3–1.2)
Total Protein: 7 g/dL (ref 6.5–8.1)

## 2019-07-27 LAB — CBC WITH DIFFERENTIAL/PLATELET
Abs Immature Granulocytes: 0.03 10*3/uL (ref 0.00–0.07)
Basophils Absolute: 0.1 10*3/uL (ref 0.0–0.1)
Basophils Relative: 1 %
Eosinophils Absolute: 0.1 10*3/uL (ref 0.0–0.5)
Eosinophils Relative: 1 %
HCT: 35.3 % — ABNORMAL LOW (ref 39.0–52.0)
Hemoglobin: 10.9 g/dL — ABNORMAL LOW (ref 13.0–17.0)
Immature Granulocytes: 0 %
Lymphocytes Relative: 8 %
Lymphs Abs: 0.7 10*3/uL (ref 0.7–4.0)
MCH: 32.3 pg (ref 26.0–34.0)
MCHC: 30.9 g/dL (ref 30.0–36.0)
MCV: 104.7 fL — ABNORMAL HIGH (ref 80.0–100.0)
Monocytes Absolute: 0.7 10*3/uL (ref 0.1–1.0)
Monocytes Relative: 7 %
Neutro Abs: 7.4 10*3/uL (ref 1.7–7.7)
Neutrophils Relative %: 83 %
Platelets: 279 10*3/uL (ref 150–400)
RBC: 3.37 MIL/uL — ABNORMAL LOW (ref 4.22–5.81)
RDW: 14.8 % (ref 11.5–15.5)
WBC: 8.9 10*3/uL (ref 4.0–10.5)
nRBC: 0 % (ref 0.0–0.2)

## 2019-07-27 MED ORDER — DEXAMETHASONE SODIUM PHOSPHATE 10 MG/ML IJ SOLN: 20.0000 mg | Freq: Once | INTRAMUSCULAR | Status: DC

## 2019-07-27 MED ORDER — SODIUM CHLORIDE 0.9 % IV SOLN
Freq: Once | INTRAVENOUS | Status: AC
  Filled 2019-07-27: qty 250

## 2019-07-27 MED ORDER — SODIUM CHLORIDE 0.9 % IV SOLN
20.0000 mg | Freq: Once | INTRAVENOUS | Status: AC
  Administered 2019-07-27: 20 mg via INTRAVENOUS
  Filled 2019-07-27: qty 20

## 2019-07-27 MED ORDER — SODIUM CHLORIDE 0.9 % IV SOLN
75.0000 mg/m2 | Freq: Once | INTRAVENOUS | Status: AC
  Administered 2019-07-27: 160 mg via INTRAVENOUS
  Filled 2019-07-27: qty 16

## 2019-07-27 NOTE — Addendum Note (Signed)
Addended by: [name] on: 07/27/2019 04:25 PM   Modules accepted: Orders

## 2019-07-27 NOTE — Progress Notes (Signed)
Patient does not offer any problems today.  

## 2019-07-27 NOTE — Progress Notes (Signed)
Hematology/Oncology Follow up note [facility] [phone] Fax:([phone]   Patient Care Team: [name], MD as PCP - General (Family Medicine) [name], MD as Consulting Physician (Hematology and Oncology)  REFERRING PROVIDER: [facility] REASON FOR VISIT:  Reestablish care for prostate cancer.  HISTORY OF PRESENTING ILLNESS:  Terry Mitchell is a  69 y.o.  male with PMH listed below who was referred to me for evaluation of elevated PSA. Patient was referred by primary care physician to urology due to elevated PSA at the level of 72.  Per note Patient was referred to urology for further management.  Staging imaging has been ordered including abdominal and pelvis CT with contrast and bone scan.  Patient was also given Mills Koller for treatment of clinical prostate cancer.  Patient reports that he was scheduled to return to urologist office for biopsy. Patient denies any dysuria, hematuria, fever or chills.  He lives by himself. Denies any bone pain.  Reports some soreness at the area of [place] shots, as well as hot flushes.  Otherwise no complaints.  #01/01/2018 Patient received Firmagon loading dose at urologist office  # patient had a prostate biopsy on 02/28/2018 pathology showed prostate adenocarcinoma with androgen deprivation treatment effect.  Adenocarcinoma is present in all core fragments with involvement of 50% or more of each core.  Perineural invasion is present. As patient has received Firmagon prior to biopsy, Gleason grade cannot be reliably assessed after androgen deprivation therapy.  #  discussed with patient's urologist Dr. Bernardo Heater who did patient's prostate biopsy.  He agrees that given Mills Koller prior to biopsy and staging images are not typical.  He agrees that PET scan is consistent with lymph node involvement.  He does not think patient is a good candidate for radical prostatectomy.  He agrees with me about the plan  that definitive radiation is a good option.  # September 2019  external Beam radiation. He was scheduled to have I-125 interstitial implant although that can not been done so patient underwent external beam IMRT prostate boost, finished in January 2020.  # patient was on androgen deprivation therapy with Firmagon injections, until August 2020. Declined ADT due to vasomotor symptoms.   #Blood work from 04/14/2019 showed testosterone level of 284, PSA has been decreased to 24.18.  # 04/28/2019 CT chest abdomen pelvis and bone scan images were independently reviewed by me and discussed with patient. Images are consistent with metastatic prostate cancer.  INTERVAL HISTORY Terry Mitchell is a 69 y.o. male who has above history reviewed by me today presents for follow-up visit for management of Stage IVA (cTX, cN1, cM1) castration sensitive metastatic prostate cancer Patient presents for evaluation prior to chemotherapy. Patient reports feeling well at baseline.  His blood pressure has been better controlled.  He is compliant with his blood pressure medication. Chronic intermittent hot flash, unchanged. Fatigue stable. He has good appetite.  Weight has decreased 4 pounds.  Patient says he usually weighs 210 to 211 pounds.  Today he is 209 pounds.   Review of Systems  Constitutional: Positive for fatigue. Negative for appetite change, chills, fever and unexpected weight change.  HENT:   Negative for hearing loss and voice change.   Eyes: Negative for eye problems and icterus.  Respiratory: Negative for chest tightness, cough and shortness of breath.   Cardiovascular: Negative for chest pain and leg swelling.  Gastrointestinal: Negative for abdominal distention and abdominal pain.  Endocrine: Positive for hot flashes.  Genitourinary: Negative for difficulty urinating, dysuria and frequency.   Musculoskeletal: Negative for arthralgias.  Skin: Negative for itching and rash.  Neurological:  Negative for light-headedness and numbness.  Hematological: Negative for adenopathy. Does not bruise/bleed easily.  Psychiatric/Behavioral: Negative for confusion.     MEDICAL HISTORY:  Past Medical History:  Diagnosis Date  . Depression    recently went on disability d/t diagnosis  . History of recent fall 01/2018   missed the last step on ladder, causing back discomfort  . Hypertension   . Prostate cancer ([place]) 12/2017   prostate    SURGICAL HISTORY: Past Surgical History:  Procedure Laterality Date  . BACK SURGERY  2001    2 rods and 6 screws in back  . Left shoulder surgery Left 1998   removed a piece of bone around collar bone  . PROSTATE BIOPSY N/A 02/28/2018   Procedure: PROSTATE BIOPSY;  Surgeon: Abbie Sons, MD;  Location: [facility] ORS;  Service: Urology;  Laterality: N/A;  . TRANSRECTAL ULTRASOUND N/A 02/28/2018   Procedure: TRANSRECTAL ULTRASOUND;  Surgeon: Abbie Sons, MD;  Location: [facility] ORS;  Service: Urology;  Laterality: N/A;    SOCIAL HISTORY: Social History   Socioeconomic History  . Marital status: Single    Spouse name: Not on file  . Number of children: 4  . Years of education: Not on file  . Highest education level: Not on file  Occupational History  . Occupation: disability  Tobacco Use  . Smoking status: Current Every Day Smoker    Packs/day: 2.00    Types: Cigars  . Smokeless tobacco: Never Used  . Tobacco comment: 2 cigars  Substance and Sexual Activity  . Alcohol use: Yes    Alcohol/week: 9.0 standard drinks    Types: 9 Cans of beer per week    Comment: occasional  . Drug use: Yes    Types: Cocaine, "Crack" cocaine    Comment: last use about 3 days ago   . Sexual activity: Not Currently  Other Topics Concern  . Not on file  Social History Narrative  . Not on file   Social Determinants of Health   Financial Resource Strain:   . Difficulty of Paying Living Expenses: Not on file  Food Insecurity:   . Worried About Paediatric nurse in the Last Year: Not on file  . Ran Out of Food in the Last Year: Not on file  Transportation Needs:   . Lack of Transportation (Medical): Not on file  . Lack of Transportation (Non-Medical): Not on file  Physical Activity:   . Days of Exercise per Week: Not on file  . Minutes of Exercise per Session: Not on file  Stress:   . Feeling of Stress : Not on file  Social Connections:   . Frequency of Communication with Friends and Family: Not on file  . Frequency of Social Gatherings with Friends and Family: Not on file  . Attends Religious Services: Not on file  . Active Member of Clubs or Organizations: Not on file  . Attends Archivist Meetings: Not on file  . Marital Status: Not on file  Intimate Partner Violence:   . Fear of Current or Ex-Partner: Not on file  . Emotionally Abused: Not on file  . Physically Abused: Not on file  . Sexually Abused: Not on file    FAMILY HISTORY: Family History  Problem Relation Age of Onset  . Stroke Mother   . Kidney failure Mother   .  Diabetes Maternal Aunt     ALLERGIES:  is allergic to penicillins and lactose intolerance (gi).  MEDICATIONS:  Current Outpatient Medications  Medication Sig Dispense Refill  . albuterol (VENTOLIN HFA) 108 (90 Base) MCG/ACT inhaler Inhale 2 puffs into the lungs every 6 (six) hours as needed for wheezing or shortness of breath. 6.7 g 0  . amLODipine (NORVASC) 5 MG tablet Take 1 tablet (5 mg total) by mouth daily. 30 tablet 0  . dexamethasone (DECADRON) 4 MG tablet Take 2 tablets (8 mg total) by mouth 2 (two) times daily. Start the day before Taxotere. Then daily after chemo for 2 days. 30 tablet 1  . loratadine (CLARITIN) 10 MG tablet Take 10 mg by mouth daily. In prep for fulphila    . losartan (COZAAR) 50 MG tablet Take 50 mg by mouth daily after breakfast.   2  . ondansetron (ZOFRAN) 8 MG tablet Take 1 tablet (8 mg total) by mouth 2 (two) times daily as needed for refractory nausea /  vomiting. 30 tablet 1  . prochlorperazine (COMPAZINE) 10 MG tablet Take 1 tablet (10 mg total) by mouth every 6 (six) hours as needed (Nausea or vomiting). 30 tablet 1  . vitamin B-12 (CYANOCOBALAMIN) 1000 MCG tablet Take 1 tablet (1,000 mcg total) by mouth daily. 90 tablet 1  . venlafaxine (EFFEXOR) 37.5 MG tablet Take 1 tablet (37.5 mg total) by mouth daily. (Patient not taking: Reported on 06/05/2019) 30 tablet 0   No current facility-administered medications for this visit.   Facility-Administered Medications Ordered in Other Visits  Medication Dose Route Frequency Provider Last Rate Last Admin  . sodium chloride flush (NS) 0.9 % injection 10 mL  10 mL Intravenous Once [name], MD         PHYSICAL EXAMINATION: ECOG PERFORMANCE STATUS: 1 - Symptomatic but completely ambulatory Vitals:   07/27/19 0944  BP: (!) 149/71  Pulse: 64  Resp: 18  Temp: 97.6 F (36.4 C)   Filed Weights   07/27/19 0944  Weight: 209 lb 8 oz (95 kg)    Physical Exam Constitutional:      General: He is not in acute distress. HENT:     Head: Normocephalic and atraumatic.  Eyes:     General: No scleral icterus.    Conjunctiva/sclera: Conjunctivae normal.     Pupils: Pupils are equal, round, and reactive to light.  Cardiovascular:     Rate and Rhythm: Normal rate and regular rhythm.     Heart sounds: Normal heart sounds.  Pulmonary:     Effort: Pulmonary effort is normal. No respiratory distress.     Breath sounds: No wheezing.  Abdominal:     General: Bowel sounds are normal. There is no distension.     Palpations: Abdomen is soft. There is no mass.     Tenderness: There is no abdominal tenderness.  Musculoskeletal:        General: No deformity. Normal range of motion.     Cervical back: Normal range of motion and neck supple.  Lymphadenopathy:     Cervical: No cervical adenopathy.  Skin:    General: Skin is warm and dry.     Findings: No erythema or rash.  Neurological:     Mental Status:  He is alert and oriented to person, place, and time.     Cranial Nerves: No cranial nerve deficit.     Coordination: Coordination normal.  Psychiatric:        Behavior: Behavior normal.  Thought Content: Thought content normal.      LABORATORY DATA:  I have reviewed the data as listed Lab Results  Component Value Date   WBC 8.9 07/27/2019   HGB 10.9 (L) 07/27/2019   HCT 35.3 (L) 07/27/2019   MCV 104.7 (H) 07/27/2019   PLT 279 07/27/2019   Recent Labs    06/26/19 0844 07/03/19 0828 07/27/19 0926  NA 138 137 139  K 4.4 4.3 3.4*  CL 105 104 102  CO2 20* 23 27  GLUCOSE 116* 140* 112*  BUN 45* 38* 25*  CREATININE 1.57* 1.63* 1.47*  CALCIUM 9.3 9.5 9.3  GFRNONAA 45* 43* 49*  GFRAA 52* 50* 57*  PROT 7.3 7.4 7.0  ALBUMIN 4.4 4.1 3.8  AST 29 25 24   ALT 18 18 19   ALKPHOS 101 83 74  BILITOT 0.9 0.8 1.0   Iron/TIBC/Ferritin/ %Sat No results found for: IRON, TIBC, FERRITIN, IRONPCTSAT   RADIOGRAPHIC STUDIES: I have personally reviewed the radiological images as listed and agreed with the findings in the report. No results found.   ASSESSMENT & PLAN:  1. Prostate cancer ([place])   2. Androgen deprivation therapy   3. Prostate cancer metastatic to bone ([place])   4. Hot flash in male   5. Stage 3b chronic kidney disease   Cancer Staging Prostate cancer [facility]) Staging form: Prostate, AJCC 8th Edition - Clinical stage from 03/19/2018: Stage IVB (cTX, cN1, cM1b) - Signed by [name], MD on 05/01/2019  #Metastatic prostate Cancer, castration sensitive PSA 24.18--> 33.15-->52.75-->13.98 Labs are reviewed and discussed with patient. Counts acceptable to proceed with cycle 3 docetaxel.  #Uncontrolled blood pressure, blood pressure has improved.  Continue losartan and amlodipine. #Androgen deprivation therapy, patient received Lupron 22.5 mg on 07/03/2019.  He is due to have next Lupron injection around 10/01/2019  #History of vitamin B12 deficiency.  Continue oral vitamin  B12 supplement.  Advised patient to stop alcohol. #CKD, creatinine 1.47.  Stable.  Continue to monitor.  Avoid nephrotoxins. #Mild hypokalemia, discussed with patient to increase potassium rich food intake. #Anemia, multifactorial hemoglobin 10.9.  Continue to monitor.  Follow-up in 3 weeks for next cycle of chemotherapy.   [name], MD, PhD Hematology Oncology [facility] at [facility] Pager- [phone] 07/27/2019

## 2019-07-28 ENCOUNTER — Inpatient Hospital Stay: Payer: Medicare HMO

## 2019-07-28 ENCOUNTER — Other Ambulatory Visit: Payer: Self-pay

## 2019-07-28 DIAGNOSIS — C61 Malignant neoplasm of prostate: Secondary | ICD-10-CM

## 2019-07-28 DIAGNOSIS — Z5111 Encounter for antineoplastic chemotherapy: Secondary | ICD-10-CM | POA: Diagnosis not present

## 2019-07-28 MED ORDER — PEGFILGRASTIM-JMDB 6 MG/0.6ML ~~LOC~~ SOSY
6.0000 mg | PREFILLED_SYRINGE | Freq: Once | SUBCUTANEOUS | Status: AC
  Administered 2019-07-28: 6 mg via SUBCUTANEOUS
  Filled 2019-07-28: qty 0.6

## 2019-08-17 ENCOUNTER — Inpatient Hospital Stay: Payer: Medicare HMO

## 2019-08-17 ENCOUNTER — Other Ambulatory Visit: Payer: Self-pay

## 2019-08-17 ENCOUNTER — Inpatient Hospital Stay (HOSPITAL_BASED_OUTPATIENT_CLINIC_OR_DEPARTMENT_OTHER): Payer: Medicare HMO | Admitting: Oncology

## 2019-08-17 ENCOUNTER — Encounter: Payer: Self-pay | Admitting: Oncology

## 2019-08-17 VITALS — BP 166/90 | HR 70 | Temp 97.5°F | Wt 212.1 lb

## 2019-08-17 VITALS — Resp 20

## 2019-08-17 DIAGNOSIS — R232 Flushing: Secondary | ICD-10-CM | POA: Diagnosis not present

## 2019-08-17 DIAGNOSIS — C61 Malignant neoplasm of prostate: Secondary | ICD-10-CM

## 2019-08-17 DIAGNOSIS — C7951 Secondary malignant neoplasm of bone: Secondary | ICD-10-CM | POA: Diagnosis not present

## 2019-08-17 DIAGNOSIS — Z5111 Encounter for antineoplastic chemotherapy: Secondary | ICD-10-CM

## 2019-08-17 LAB — CBC WITH DIFFERENTIAL/PLATELET
Abs Immature Granulocytes: 0.02 10*3/uL (ref 0.00–0.07)
Basophils Absolute: 0 10*3/uL (ref 0.0–0.1)
Basophils Relative: 0 %
Eosinophils Absolute: 0 10*3/uL (ref 0.0–0.5)
Eosinophils Relative: 0 %
HCT: 36 % — ABNORMAL LOW (ref 39.0–52.0)
Hemoglobin: 11.2 g/dL — ABNORMAL LOW (ref 13.0–17.0)
Immature Granulocytes: 0 %
Lymphocytes Relative: 7 %
Lymphs Abs: 0.3 10*3/uL — ABNORMAL LOW (ref 0.7–4.0)
MCH: 32.8 pg (ref 26.0–34.0)
MCHC: 31.1 g/dL (ref 30.0–36.0)
MCV: 105.6 fL — ABNORMAL HIGH (ref 80.0–100.0)
Monocytes Absolute: 0.1 10*3/uL (ref 0.1–1.0)
Monocytes Relative: 1 %
Neutro Abs: 4.5 10*3/uL (ref 1.7–7.7)
Neutrophils Relative %: 92 %
Platelets: 216 10*3/uL (ref 150–400)
RBC: 3.41 MIL/uL — ABNORMAL LOW (ref 4.22–5.81)
RDW: 14.9 % (ref 11.5–15.5)
WBC: 4.9 10*3/uL (ref 4.0–10.5)
nRBC: 0 % (ref 0.0–0.2)

## 2019-08-17 LAB — COMPREHENSIVE METABOLIC PANEL
ALT: 20 U/L (ref 0–44)
AST: 45 U/L — ABNORMAL HIGH (ref 15–41)
Albumin: 4.2 g/dL (ref 3.5–5.0)
Alkaline Phosphatase: 93 U/L (ref 38–126)
Anion gap: 10 (ref 5–15)
BUN: 29 mg/dL — ABNORMAL HIGH (ref 8–23)
CO2: 24 mmol/L (ref 22–32)
Calcium: 9.5 mg/dL (ref 8.9–10.3)
Chloride: 103 mmol/L (ref 98–111)
Creatinine, Ser: 1.36 mg/dL — ABNORMAL HIGH (ref 0.61–1.24)
GFR calc Af Amer: >60 mL/min (ref >60)
GFR calc non Af Amer: 54 mL/min — ABNORMAL LOW (ref >60)
Glucose, Bld: 132 mg/dL — ABNORMAL HIGH (ref 70–99)
Potassium: 4.3 mmol/L (ref 3.5–5.1)
Sodium: 137 mmol/L (ref 135–145)
Total Bilirubin: 0.9 mg/dL (ref 0.3–1.2)
Total Protein: 7.1 g/dL (ref 6.5–8.1)

## 2019-08-17 LAB — PSA: Prostatic Specific Antigen: 7.17 ng/mL — ABNORMAL HIGH (ref 0.00–4.00)

## 2019-08-17 MED ORDER — SODIUM CHLORIDE 0.9 % IV SOLN
75.0000 mg/m2 | Freq: Once | INTRAVENOUS | Status: AC
  Administered 2019-08-17: 10:00:00 160 mg via INTRAVENOUS
  Filled 2019-08-17: qty 16

## 2019-08-17 MED ORDER — DEXAMETHASONE SODIUM PHOSPHATE 10 MG/ML IJ SOLN
10.0000 mg | Freq: Once | INTRAMUSCULAR | Status: AC
  Administered 2019-08-17: 10 mg via INTRAVENOUS
  Filled 2019-08-17: qty 1

## 2019-08-17 MED ORDER — SODIUM CHLORIDE 0.9 % IV SOLN
Freq: Once | INTRAVENOUS | Status: AC
  Filled 2019-08-17: qty 250

## 2019-08-17 NOTE — Progress Notes (Addendum)
Hematology/Oncology Follow up note [facility] Telephone:([phone] Fax:(336) [phone]   Patient Care Team: [name], MD as PCP - General (Family Medicine) [name], MD as Consulting Physician (Hematology and Oncology)  REFERRING PROVIDER: [facility] REASON FOR VISIT:  Reestablish care for prostate cancer.  HISTORY OF PRESENTING ILLNESS:  Terry Mitchell is a  69 y.o.  male with PMH listed below who was referred to me for evaluation of elevated PSA. Patient was referred by primary care physician to urology due to elevated PSA at the level of 72.  Per note Patient was referred to urology for further management.  Staging imaging has been ordered including abdominal and pelvis CT with contrast and bone scan.  Patient was also given Mills Koller for treatment of clinical prostate cancer.  Patient reports that he was scheduled to return to urologist office for biopsy. Patient denies any dysuria, hematuria, fever or chills.  He lives by himself. Denies any bone pain.  Reports some soreness at the area of [place] shots, as well as hot flushes.  Otherwise no complaints.  #01/01/2018 Patient received Firmagon loading dose at urologist office  # patient had a prostate biopsy on 02/28/2018 pathology showed prostate adenocarcinoma with androgen deprivation treatment effect.  Adenocarcinoma is present in all core fragments with involvement of 50% or more of each core.  Perineural invasion is present. As patient has received Firmagon prior to biopsy, Gleason grade cannot be reliably assessed after androgen deprivation therapy.  #  discussed with patient's urologist Dr. Bernardo Heater who did patient's prostate biopsy.  He agrees that given Mills Koller prior to biopsy and staging images are not typical.  He agrees that PET scan is consistent with lymph node involvement.  He does not think patient is a good candidate for radical prostatectomy.  He agrees with me about the plan  that definitive radiation is a good option.  # September 2019  external Beam radiation. He was scheduled to have I-125 interstitial implant although that can not been done so patient underwent external beam IMRT prostate boost, finished in January 2020.  # patient was on androgen deprivation therapy with Firmagon injections, until August 2020. Declined ADT due to vasomotor symptoms.   #Blood work from 04/14/2019 showed testosterone level of 284, PSA has been decreased to 24.18.  # 04/28/2019 CT chest abdomen pelvis and bone scan images were independently reviewed by me and discussed with patient. Images are consistent with metastatic prostate cancer.  INTERVAL HISTORY Terry Mitchell is a 69 y.o. male who has above history reviewed by me today presents for follow-up visit for management of Stage IVA (cTX, cN1, cM1) castration sensitive metastatic prostate cancer Patient presents for evaluation prior to chemotherapy. Patient reports feeling well.  Blood pressure was 166/90. Patient has chronic cough//sweating due to androgen deprivation therapy. Chronic fatigue is stable.  He has no new complaints.  Appetite is good.  Weight is stable.   Review of Systems  Constitutional: Positive for fatigue. Negative for appetite change, chills, fever and unexpected weight change.  HENT:   Negative for hearing loss and voice change.   Eyes: Negative for eye problems and icterus.  Respiratory: Negative for chest tightness, cough and shortness of breath.   Cardiovascular: Negative for chest pain and leg swelling.  Gastrointestinal: Negative for abdominal distention and abdominal pain.  Endocrine: Positive for hot flashes.  Genitourinary: Negative for difficulty urinating, dysuria and frequency.   Musculoskeletal: Negative for arthralgias.  Skin: Negative for itching and  rash.  Neurological: Negative for light-headedness and numbness.  Hematological: Negative for adenopathy. Does not bruise/bleed easily.    Psychiatric/Behavioral: Negative for confusion.     MEDICAL HISTORY:  Past Medical History:  Diagnosis Date  . Depression    recently went on disability d/t diagnosis  . History of recent fall 01/2018   missed the last step on ladder, causing back discomfort  . Hypertension   . Prostate cancer ([place]) 12/2017   prostate    SURGICAL HISTORY: Past Surgical History:  Procedure Laterality Date  . BACK SURGERY  2001    2 rods and 6 screws in back  . Left shoulder surgery Left 1998   removed a piece of bone around collar bone  . PROSTATE BIOPSY N/A 02/28/2018   Procedure: PROSTATE BIOPSY;  Surgeon: Abbie Sons, MD;  Location: [facility] ORS;  Service: Urology;  Laterality: N/A;  . TRANSRECTAL ULTRASOUND N/A 02/28/2018   Procedure: TRANSRECTAL ULTRASOUND;  Surgeon: Abbie Sons, MD;  Location: [facility] ORS;  Service: Urology;  Laterality: N/A;    SOCIAL HISTORY: Social History   Socioeconomic History  . Marital status: Single    Spouse name: Not on file  . Number of children: 4  . Years of education: Not on file  . Highest education level: Not on file  Occupational History  . Occupation: disability  Tobacco Use  . Smoking status: Current Every Day Smoker    Packs/day: 2.00    Types: Cigars  . Smokeless tobacco: Never Used  . Tobacco comment: 2 cigars  Substance and Sexual Activity  . Alcohol use: Yes    Alcohol/week: 9.0 standard drinks    Types: 9 Cans of beer per week    Comment: occasional  . Drug use: Yes    Types: Cocaine, "Crack" cocaine    Comment: last use about 3 days ago   . Sexual activity: Not Currently  Other Topics Concern  . Not on file  Social History Narrative  . Not on file   Social Determinants of Health   Financial Resource Strain:   . Difficulty of Paying Living Expenses: Not on file  Food Insecurity:   . Worried About Charity fundraiser in the Last Year: Not on file  . Ran Out of Food in the Last Year: Not on file  Transportation Needs:    . Lack of Transportation (Medical): Not on file  . Lack of Transportation (Non-Medical): Not on file  Physical Activity:   . Days of Exercise per Week: Not on file  . Minutes of Exercise per Session: Not on file  Stress:   . Feeling of Stress : Not on file  Social Connections:   . Frequency of Communication with Friends and Family: Not on file  . Frequency of Social Gatherings with Friends and Family: Not on file  . Attends Religious Services: Not on file  . Active Member of Clubs or Organizations: Not on file  . Attends Archivist Meetings: Not on file  . Marital Status: Not on file  Intimate Partner Violence:   . Fear of Current or Ex-Partner: Not on file  . Emotionally Abused: Not on file  . Physically Abused: Not on file  . Sexually Abused: Not on file    FAMILY HISTORY: Family History  Problem Relation Age of Onset  . Stroke Mother   . Kidney failure Mother   . Diabetes Maternal Aunt     ALLERGIES:  is allergic to penicillins and lactose intolerance (  gi).  MEDICATIONS:  Current Outpatient Medications  Medication Sig Dispense Refill  . albuterol (VENTOLIN HFA) 108 (90 Base) MCG/ACT inhaler Inhale 2 puffs into the lungs every 6 (six) hours as needed for wheezing or shortness of breath. 6.7 g 0  . amLODipine (NORVASC) 5 MG tablet Take 1 tablet (5 mg total) by mouth daily. 30 tablet 0  . dexamethasone (DECADRON) 4 MG tablet Take 2 tablets (8 mg total) by mouth 2 (two) times daily. Start the day before Taxotere. Then daily after chemo for 2 days. 30 tablet 1  . loratadine (CLARITIN) 10 MG tablet Take 10 mg by mouth daily. In prep for fulphila    . losartan (COZAAR) 50 MG tablet Take 50 mg by mouth daily after breakfast.   2  . ondansetron (ZOFRAN) 8 MG tablet Take 1 tablet (8 mg total) by mouth 2 (two) times daily as needed for refractory nausea / vomiting. 30 tablet 1  . prochlorperazine (COMPAZINE) 10 MG tablet Take 1 tablet (10 mg total) by mouth every 6 (six)  hours as needed (Nausea or vomiting). 30 tablet 1  . vitamin B-12 (CYANOCOBALAMIN) 1000 MCG tablet Take 1 tablet (1,000 mcg total) by mouth daily. 90 tablet 1   No current facility-administered medications for this visit.   Facility-Administered Medications Ordered in Other Visits  Medication Dose Route Frequency Provider Last Rate Last Admin  . sodium chloride flush (NS) 0.9 % injection 10 mL  10 mL Intravenous Once [name], MD         PHYSICAL EXAMINATION: ECOG PERFORMANCE STATUS: 1 - Symptomatic but completely ambulatory Vitals:   08/17/19 0846  BP: (!) 166/90  Pulse: 70  Temp: (!) 97.5 F (36.4 C)   Filed Weights   08/17/19 0846  Weight: 212 lb 1.6 oz (96.2 kg)    Physical Exam Constitutional:      General: He is not in acute distress. HENT:     Head: Normocephalic and atraumatic.  Eyes:     General: No scleral icterus.    Conjunctiva/sclera: Conjunctivae normal.     Pupils: Pupils are equal, round, and reactive to light.  Cardiovascular:     Rate and Rhythm: Normal rate and regular rhythm.     Heart sounds: Normal heart sounds.  Pulmonary:     Effort: Pulmonary effort is normal. No respiratory distress.     Breath sounds: No wheezing.  Abdominal:     General: Bowel sounds are normal. There is no distension.     Palpations: Abdomen is soft. There is no mass.     Tenderness: There is no abdominal tenderness.  Musculoskeletal:        General: No deformity. Normal range of motion.     Cervical back: Normal range of motion and neck supple.  Lymphadenopathy:     Cervical: No cervical adenopathy.  Skin:    General: Skin is warm and dry.     Findings: No erythema or rash.  Neurological:     Mental Status: He is alert and oriented to person, place, and time.     Cranial Nerves: No cranial nerve deficit.     Coordination: Coordination normal.  Psychiatric:        Behavior: Behavior normal.        Thought Content: Thought content normal.      LABORATORY DATA:   I have reviewed the data as listed Lab Results  Component Value Date   WBC 4.9 08/17/2019   HGB 11.2 (L) 08/17/2019  HCT 36.0 (L) 08/17/2019   MCV 105.6 (H) 08/17/2019   PLT 216 08/17/2019   Recent Labs    07/03/19 0828 07/27/19 0926 08/17/19 0815  NA 137 139 137  K 4.3 3.4* 4.3  CL 104 102 103  CO2 23 27 24   GLUCOSE 140* 112* 132*  BUN 38* 25* 29*  CREATININE 1.63* 1.47* 1.36*  CALCIUM 9.5 9.3 9.5  GFRNONAA 43* 49* 54*  GFRAA 50* 57* >60  PROT 7.4 7.0 7.1  ALBUMIN 4.1 3.8 4.2  AST 25 24 45*  ALT 18 19 20   ALKPHOS 83 74 93  BILITOT 0.8 1.0 0.9   Iron/TIBC/Ferritin/ %Sat No results found for: IRON, TIBC, FERRITIN, IRONPCTSAT   RADIOGRAPHIC STUDIES: I have personally reviewed the radiological images as listed and agreed with the findings in the report. No results found.   ASSESSMENT & PLAN:  1. Prostate cancer metastatic to bone ([place])   2. Hot flash in male   3. Encounter for antineoplastic chemotherapy   Cancer Staging Prostate cancer [facility]) Staging form: Prostate, AJCC 8th Edition - Clinical stage from 03/19/2018: Stage IVB (cTX, cN1, cM1b) - Signed by [name], MD on 05/01/2019  #Metastatic prostate Cancer, castration sensitive PSA 24.18--> 33.15-->52.75-->13.98--> 7.7 Labs reviewed and discussed with patient. Counts acceptable to proceed with cycle 4 docetaxel.  Patient will receive growth factor support.    #Androgen deprivation therapy, patient received Lupron 22.5 mg on 07/03/2019.  He is due to have next Lupron injection around 10/01/2019  #CKD, creatinine 1.36 stable.  Monitor avoid nephrotoxins. #Hypokalemia has resolved. #Anemia, multifactorial hemoglobin 11.2, stable.  Monitor.Marland Kitchen #Patient is overdue for colon cancer screening, will refer to gastroenterology. Follow-up in 3 weeks for next cycle of chemotherapy.   [name], MD, PhD Hematology Oncology [facility] at [facility] Pager- [phone] 08/17/2019

## 2019-08-17 NOTE — Progress Notes (Signed)
Patient reports a recent increase in back pain, does have history of back surgery, with pain 6/10 today and is using a cane.

## 2019-08-18 ENCOUNTER — Inpatient Hospital Stay: Payer: Medicare HMO

## 2019-08-18 ENCOUNTER — Other Ambulatory Visit: Payer: Self-pay

## 2019-08-18 DIAGNOSIS — Z5111 Encounter for antineoplastic chemotherapy: Secondary | ICD-10-CM | POA: Diagnosis not present

## 2019-08-18 DIAGNOSIS — C61 Malignant neoplasm of prostate: Secondary | ICD-10-CM

## 2019-08-18 MED ORDER — PEGFILGRASTIM-JMDB 6 MG/0.6ML ~~LOC~~ SOSY
6.0000 mg | PREFILLED_SYRINGE | Freq: Once | SUBCUTANEOUS | Status: AC
  Administered 2019-08-18: 6 mg via SUBCUTANEOUS
  Filled 2019-08-18: qty 0.6

## 2019-08-20 ENCOUNTER — Inpatient Hospital Stay (HOSPITAL_BASED_OUTPATIENT_CLINIC_OR_DEPARTMENT_OTHER): Payer: Medicare HMO | Admitting: Hospice and Palliative Medicine

## 2019-08-20 DIAGNOSIS — C61 Malignant neoplasm of prostate: Secondary | ICD-10-CM

## 2019-08-20 DIAGNOSIS — Z515 Encounter for palliative care: Secondary | ICD-10-CM | POA: Diagnosis not present

## 2019-08-20 NOTE — Progress Notes (Signed)
Virtual Visit via Telephone Note  I connected with Terry Mitchell on 08/20/19 at 10:30 AM EST by telephone and verified that I am speaking with the correct person using two identifiers.   I discussed the limitations, risks, security and privacy concerns of performing an evaluation and management service by telephone and the availability of in person appointments. I also discussed with the patient that there may be a patient responsible charge related to this service. The patient expressed understanding and agreed to proceed.   History of Present Illness: Palliative Care consult requested for this 69 y.o. male with multiple medical problems including stage IV prostate cancer metastatic to bone (diagnosed August 2019) status post XRT and on treatment with chemotherapy.  He was referred to palliative care to help address goals and manage ongoing symptoms.   Observations/Objective: I called and spoke with patient by phone.   Patient reports he is doing well.  He denies any significant changes or concerns.  He says that he has occasional fatigue for a day or so following treatments but overall feels he is tolerating them well.  He had short course of nausea recently but says that it resolved when he took his Zofran.  No other distressing symptoms were reported.  No changes to performance status.  Appetite is reportedly good.    Assessment and Plan: Stage IV prostate cancer - on treatment with Lupron.  Seems to be doing well.  Patient is followed by Dr. Tasia Catchings with next appointment on 2/15.  GOC - seem aligned with current scope of treatment. Will benefit from future conversations regarding goals/ACP when patient has been seen in person.  Follow Up Instructions: Follow up telephone visit in 1 to 2 months   I discussed the assessment and treatment plan with the patient. The patient was provided an opportunity to ask questions and all were answered. The patient agreed with the plan and demonstrated an  understanding of the instructions.   The patient was advised to call back or seek an in-person evaluation if the symptoms worsen or if the condition fails to improve as anticipated.  I provided 5 minutes of non-face-to-face time during this encounter.   Irean Hong, NP

## 2019-09-07 ENCOUNTER — Other Ambulatory Visit: Payer: Self-pay

## 2019-09-07 ENCOUNTER — Encounter: Payer: Self-pay | Admitting: Oncology

## 2019-09-07 ENCOUNTER — Telehealth: Payer: Self-pay

## 2019-09-07 ENCOUNTER — Inpatient Hospital Stay: Payer: Medicare HMO

## 2019-09-07 ENCOUNTER — Inpatient Hospital Stay: Payer: Medicare HMO | Attending: Oncology

## 2019-09-07 ENCOUNTER — Inpatient Hospital Stay (HOSPITAL_BASED_OUTPATIENT_CLINIC_OR_DEPARTMENT_OTHER): Payer: Medicare HMO | Admitting: Oncology

## 2019-09-07 VITALS — BP 207/107 | Temp 96.2°F | Resp 18 | Wt 212.4 lb

## 2019-09-07 DIAGNOSIS — Z5111 Encounter for antineoplastic chemotherapy: Secondary | ICD-10-CM | POA: Insufficient documentation

## 2019-09-07 DIAGNOSIS — Z833 Family history of diabetes mellitus: Secondary | ICD-10-CM | POA: Insufficient documentation

## 2019-09-07 DIAGNOSIS — Z880 Allergy status to penicillin: Secondary | ICD-10-CM | POA: Diagnosis not present

## 2019-09-07 DIAGNOSIS — C61 Malignant neoplasm of prostate: Secondary | ICD-10-CM | POA: Insufficient documentation

## 2019-09-07 DIAGNOSIS — R232 Flushing: Secondary | ICD-10-CM | POA: Diagnosis not present

## 2019-09-07 DIAGNOSIS — Z841 Family history of disorders of kidney and ureter: Secondary | ICD-10-CM | POA: Insufficient documentation

## 2019-09-07 DIAGNOSIS — F1490 Cocaine use, unspecified, uncomplicated: Secondary | ICD-10-CM | POA: Insufficient documentation

## 2019-09-07 DIAGNOSIS — I129 Hypertensive chronic kidney disease with stage 1 through stage 4 chronic kidney disease, or unspecified chronic kidney disease: Secondary | ICD-10-CM | POA: Insufficient documentation

## 2019-09-07 DIAGNOSIS — Z7289 Other problems related to lifestyle: Secondary | ICD-10-CM | POA: Diagnosis not present

## 2019-09-07 DIAGNOSIS — Z5189 Encounter for other specified aftercare: Secondary | ICD-10-CM | POA: Diagnosis not present

## 2019-09-07 DIAGNOSIS — Z79899 Other long term (current) drug therapy: Secondary | ICD-10-CM | POA: Diagnosis not present

## 2019-09-07 DIAGNOSIS — Z1211 Encounter for screening for malignant neoplasm of colon: Secondary | ICD-10-CM | POA: Diagnosis not present

## 2019-09-07 DIAGNOSIS — D72829 Elevated white blood cell count, unspecified: Secondary | ICD-10-CM | POA: Insufficient documentation

## 2019-09-07 DIAGNOSIS — R972 Elevated prostate specific antigen [PSA]: Secondary | ICD-10-CM | POA: Insufficient documentation

## 2019-09-07 DIAGNOSIS — Z7952 Long term (current) use of systemic steroids: Secondary | ICD-10-CM | POA: Insufficient documentation

## 2019-09-07 DIAGNOSIS — C7951 Secondary malignant neoplasm of bone: Secondary | ICD-10-CM | POA: Insufficient documentation

## 2019-09-07 DIAGNOSIS — R5383 Other fatigue: Secondary | ICD-10-CM | POA: Diagnosis not present

## 2019-09-07 DIAGNOSIS — I10 Essential (primary) hypertension: Secondary | ICD-10-CM | POA: Diagnosis not present

## 2019-09-07 DIAGNOSIS — Z823 Family history of stroke: Secondary | ICD-10-CM | POA: Diagnosis not present

## 2019-09-07 DIAGNOSIS — N1832 Chronic kidney disease, stage 3b: Secondary | ICD-10-CM | POA: Insufficient documentation

## 2019-09-07 DIAGNOSIS — F17210 Nicotine dependence, cigarettes, uncomplicated: Secondary | ICD-10-CM | POA: Diagnosis not present

## 2019-09-07 LAB — COMPREHENSIVE METABOLIC PANEL
ALT: 20 U/L (ref 0–44)
AST: 28 U/L (ref 15–41)
Albumin: 4.2 g/dL (ref 3.5–5.0)
Alkaline Phosphatase: 82 U/L (ref 38–126)
Anion gap: 11 (ref 5–15)
BUN: 34 mg/dL — ABNORMAL HIGH (ref 8–23)
CO2: 24 mmol/L (ref 22–32)
Calcium: 9.8 mg/dL (ref 8.9–10.3)
Chloride: 101 mmol/L (ref 98–111)
Creatinine, Ser: 1.28 mg/dL — ABNORMAL HIGH (ref 0.61–1.24)
GFR calc Af Amer: >60 mL/min (ref >60)
GFR calc non Af Amer: 58 mL/min — ABNORMAL LOW (ref >60)
Glucose, Bld: 120 mg/dL — ABNORMAL HIGH (ref 70–99)
Potassium: 4.2 mmol/L (ref 3.5–5.1)
Sodium: 136 mmol/L (ref 135–145)
Total Bilirubin: 1 mg/dL (ref 0.3–1.2)
Total Protein: 7.4 g/dL (ref 6.5–8.1)

## 2019-09-07 LAB — CBC WITH DIFFERENTIAL/PLATELET
Abs Immature Granulocytes: 0.03 10*3/uL (ref 0.00–0.07)
Basophils Absolute: 0 10*3/uL (ref 0.0–0.1)
Basophils Relative: 0 %
Eosinophils Absolute: 0 10*3/uL (ref 0.0–0.5)
Eosinophils Relative: 0 %
HCT: 36 % — ABNORMAL LOW (ref 39.0–52.0)
Hemoglobin: 11.2 g/dL — ABNORMAL LOW (ref 13.0–17.0)
Immature Granulocytes: 0 %
Lymphocytes Relative: 5 %
Lymphs Abs: 0.3 10*3/uL — ABNORMAL LOW (ref 0.7–4.0)
MCH: 32.7 pg (ref 26.0–34.0)
MCHC: 31.1 g/dL (ref 30.0–36.0)
MCV: 105.3 fL — ABNORMAL HIGH (ref 80.0–100.0)
Monocytes Absolute: 0.2 10*3/uL (ref 0.1–1.0)
Monocytes Relative: 3 %
Neutro Abs: 6.6 10*3/uL (ref 1.7–7.7)
Neutrophils Relative %: 92 %
Platelets: 209 10*3/uL (ref 150–400)
RBC: 3.42 MIL/uL — ABNORMAL LOW (ref 4.22–5.81)
RDW: 15.1 % (ref 11.5–15.5)
WBC: 7.1 10*3/uL (ref 4.0–10.5)
nRBC: 0 % (ref 0.0–0.2)

## 2019-09-07 LAB — PSA: Prostatic Specific Antigen: 5.51 ng/mL — ABNORMAL HIGH (ref 0.00–4.00)

## 2019-09-07 NOTE — Telephone Encounter (Signed)
Pt's blood pressure elevated this morning. Dr. Tasia Catchings recommends for patient to go to ER but patient refused. I contacted [place] [facility] this morning and got him an appt today at 3:20. Patient notified and states he will be there today.

## 2019-09-07 NOTE — Progress Notes (Signed)
Patient reports the Dexamethasone causes him to have the hiccups that last 2 days.

## 2019-09-07 NOTE — Progress Notes (Signed)
Hematology/Oncology Follow up note [facility] [phone] [phone]   Patient Care Team: [name], MD as PCP - General (Family Medicine) [name], MD as Consulting Physician (Hematology and Oncology)  REFERRING PROVIDER: [facility] REASON FOR VISIT:  Reestablish care for prostate cancer.  HISTORY OF PRESENTING ILLNESS:  Terry Mitchell is a  69 y.o.  male with PMH listed below who was referred to me for evaluation of elevated PSA. Patient was referred by primary care physician to urology due to elevated PSA at the level of 72.  Per note Patient was referred to urology for further management.  Staging imaging has been ordered including abdominal and pelvis CT with contrast and bone scan.  Patient was also given Mills Koller for treatment of clinical prostate cancer.  Patient reports that he was scheduled to return to urologist office for biopsy. Patient denies any dysuria, hematuria, fever or chills.  He lives by himself. Denies any bone pain.  Reports some soreness at the area of [place] shots, as well as hot flushes.  Otherwise no complaints.  #01/01/2018 Patient received Firmagon loading dose at urologist office  # patient had a prostate biopsy on 02/28/2018 pathology showed prostate adenocarcinoma with androgen deprivation treatment effect.  Adenocarcinoma is present in all core fragments with involvement of 50% or more of each core.  Perineural invasion is present. As patient has received Firmagon prior to biopsy, Gleason grade cannot be reliably assessed after androgen deprivation therapy.  #  discussed with patient's urologist Dr. Bernardo Heater who did patient's prostate biopsy.  He agrees that given Mills Koller prior to biopsy and staging images are not typical.  He agrees that PET scan is consistent with lymph node involvement.  He does not think patient is a good candidate for radical prostatectomy.  He agrees with me about the plan  that definitive radiation is a good option.  # September 2019  external Beam radiation. He was scheduled to have I-125 interstitial implant although that can not been done so patient underwent external beam IMRT prostate boost, finished in January 2020.  # patient was on androgen deprivation therapy with Firmagon injections, until August 2020. Declined ADT due to vasomotor symptoms.   #Blood work from 04/14/2019 showed testosterone level of 284, PSA has been decreased to 24.18.  # 04/28/2019 CT chest abdomen pelvis and bone scan images were independently reviewed by me and discussed with patient. Images are consistent with metastatic prostate cancer.  INTERVAL HISTORY Terry Mitchell is a 69 y.o. male who has above history reviewed by me today presents for follow-up visit for management of Stage IVA (cTX, cN1, cM1) castration sensitive metastatic prostate cancer Patient presents for evaluation prior to chemotherapy. Patient has no new complaints today. However his blood pressure was high at 207/107.  Patient reports that he has been compliant with all his blood pressure medications. Denies any headache, chest pain, shortness of breath focal deficit.   Review of Systems  Constitutional: Positive for fatigue. Negative for appetite change, chills, fever and unexpected weight change.  HENT:   Negative for hearing loss and voice change.   Eyes: Negative for eye problems and icterus.  Respiratory: Negative for chest tightness, cough and shortness of breath.   Cardiovascular: Negative for chest pain and leg swelling.  Gastrointestinal: Negative for abdominal distention, abdominal pain and blood in stool.  Endocrine: Positive for hot flashes.  Genitourinary: Negative for difficulty urinating, dysuria and frequency.   Musculoskeletal: Negative for arthralgias.  Skin: Negative for itching and rash.  Neurological: Negative for extremity weakness, light-headedness and numbness.  Hematological:  Negative for adenopathy. Does not bruise/bleed easily.  Psychiatric/Behavioral: Negative for confusion.     MEDICAL HISTORY:  Past Medical History:  Diagnosis Date  . Depression    recently went on disability d/t diagnosis  . History of recent fall 01/2018   missed the last step on ladder, causing back discomfort  . Hypertension   . Prostate cancer ([place]) 12/2017   prostate    SURGICAL HISTORY: Past Surgical History:  Procedure Laterality Date  . BACK SURGERY  2001    2 rods and 6 screws in back  . Left shoulder surgery Left 1998   removed a piece of bone around collar bone  . PROSTATE BIOPSY N/A 02/28/2018   Procedure: PROSTATE BIOPSY;  Surgeon: Abbie Sons, MD;  Location: [facility] ORS;  Service: Urology;  Laterality: N/A;  . TRANSRECTAL ULTRASOUND N/A 02/28/2018   Procedure: TRANSRECTAL ULTRASOUND;  Surgeon: Abbie Sons, MD;  Location: [facility] ORS;  Service: Urology;  Laterality: N/A;    SOCIAL HISTORY: Social History   Socioeconomic History  . Marital status: Single    Spouse name: Not on file  . Number of children: 4  . Years of education: Not on file  . Highest education level: Not on file  Occupational History  . Occupation: disability  Tobacco Use  . Smoking status: Current Every Day Smoker    Packs/day: 2.00    Types: Cigars  . Smokeless tobacco: Never Used  . Tobacco comment: 2 cigars  Substance and Sexual Activity  . Alcohol use: Yes    Alcohol/week: 9.0 standard drinks    Types: 9 Cans of beer per week    Comment: occasional  . Drug use: Yes    Types: Cocaine, "Crack" cocaine    Comment: last use about 3 days ago   . Sexual activity: Not Currently  Other Topics Concern  . Not on file  Social History Narrative  . Not on file   Social Determinants of Health   Financial Resource Strain:   . Difficulty of Paying Living Expenses: Not on file  Food Insecurity:   . Worried About Charity fundraiser in the Last Year: Not on file  . Ran Out of Food  in the Last Year: Not on file  Transportation Needs:   . Lack of Transportation (Medical): Not on file  . Lack of Transportation (Non-Medical): Not on file  Physical Activity:   . Days of Exercise per Week: Not on file  . Minutes of Exercise per Session: Not on file  Stress:   . Feeling of Stress : Not on file  Social Connections:   . Frequency of Communication with Friends and Family: Not on file  . Frequency of Social Gatherings with Friends and Family: Not on file  . Attends Religious Services: Not on file  . Active Member of Clubs or Organizations: Not on file  . Attends Archivist Meetings: Not on file  . Marital Status: Not on file  Intimate Partner Violence:   . Fear of Current or Ex-Partner: Not on file  . Emotionally Abused: Not on file  . Physically Abused: Not on file  . Sexually Abused: Not on file    FAMILY HISTORY: Family History  Problem Relation Age of Onset  . Stroke Mother   . Kidney failure Mother   . Diabetes Maternal Aunt     ALLERGIES:  is  allergic to penicillins and lactose intolerance (gi).  MEDICATIONS:  Current Outpatient Medications  Medication Sig Dispense Refill  . albuterol (VENTOLIN HFA) 108 (90 Base) MCG/ACT inhaler Inhale 2 puffs into the lungs every 6 (six) hours as needed for wheezing or shortness of breath. 6.7 g 0  . amLODipine (NORVASC) 5 MG tablet Take 1 tablet (5 mg total) by mouth daily. 30 tablet 0  . dexamethasone (DECADRON) 4 MG tablet Take 2 tablets (8 mg total) by mouth 2 (two) times daily. Start the day before Taxotere. Then daily after chemo for 2 days. 30 tablet 1  . loratadine (CLARITIN) 10 MG tablet Take 10 mg by mouth daily. In prep for fulphila    . losartan (COZAAR) 50 MG tablet Take 50 mg by mouth daily after breakfast.   2  . ondansetron (ZOFRAN) 8 MG tablet Take 1 tablet (8 mg total) by mouth 2 (two) times daily as needed for refractory nausea / vomiting. 30 tablet 1  . prochlorperazine (COMPAZINE) 10 MG  tablet Take 1 tablet (10 mg total) by mouth every 6 (six) hours as needed (Nausea or vomiting). 30 tablet 1  . vitamin B-12 (CYANOCOBALAMIN) 1000 MCG tablet Take 1 tablet (1,000 mcg total) by mouth daily. 90 tablet 1   No current facility-administered medications for this visit.   Facility-Administered Medications Ordered in Other Visits  Medication Dose Route Frequency Provider Last Rate Last Admin  . sodium chloride flush (NS) 0.9 % injection 10 mL  10 mL Intravenous Once [name], MD         PHYSICAL EXAMINATION: ECOG PERFORMANCE STATUS: 1 - Symptomatic but completely ambulatory Vitals:   09/07/19 0933  BP: (!) 207/107  Resp: 18  Temp: (!) 96.2 F (35.7 C)   Filed Weights   09/07/19 0933  Weight: 212 lb 6.4 oz (96.3 kg)    Physical Exam Constitutional:      General: He is not in acute distress. HENT:     Head: Normocephalic and atraumatic.  Eyes:     General: No scleral icterus.    Conjunctiva/sclera: Conjunctivae normal.     Pupils: Pupils are equal, round, and reactive to light.  Cardiovascular:     Rate and Rhythm: Normal rate and regular rhythm.     Heart sounds: Normal heart sounds.  Pulmonary:     Effort: Pulmonary effort is normal. No respiratory distress.     Breath sounds: No wheezing.  Abdominal:     General: Bowel sounds are normal. There is no distension.     Palpations: Abdomen is soft. There is no mass.     Tenderness: There is no abdominal tenderness.  Musculoskeletal:        General: No deformity. Normal range of motion.     Cervical back: Normal range of motion and neck supple.  Lymphadenopathy:     Cervical: No cervical adenopathy.  Skin:    General: Skin is warm and dry.     Findings: No erythema or rash.  Neurological:     Mental Status: He is alert and oriented to person, place, and time. Mental status is at baseline.     Cranial Nerves: No cranial nerve deficit.     Coordination: Coordination normal.  Psychiatric:        Mood and  Affect: Mood normal.        Behavior: Behavior normal.        Thought Content: Thought content normal.      LABORATORY DATA:  I have  reviewed the data as listed Lab Results  Component Value Date   WBC 7.1 09/07/2019   HGB 11.2 (L) 09/07/2019   HCT 36.0 (L) 09/07/2019   MCV 105.3 (H) 09/07/2019   PLT 209 09/07/2019   Recent Labs    07/27/19 0926 08/17/19 0815 09/07/19 0833  NA 139 137 136  K 3.4* 4.3 4.2  CL 102 103 101  CO2 27 24 24   GLUCOSE 112* 132* 120*  BUN 25* 29* 34*  CREATININE 1.47* 1.36* 1.28*  CALCIUM 9.3 9.5 9.8  GFRNONAA 49* 54* 58*  GFRAA 57* >60 >60  PROT 7.0 7.1 7.4  ALBUMIN 3.8 4.2 4.2  AST 24 45* 28  ALT 19 20 20   ALKPHOS 74 93 82  BILITOT 1.0 0.9 1.0   Iron/TIBC/Ferritin/ %Sat No results found for: IRON, TIBC, FERRITIN, IRONPCTSAT   RADIOGRAPHIC STUDIES: I have personally reviewed the radiological images as listed and agreed with the findings in the report. No results found.   ASSESSMENT & PLAN:  1. Prostate cancer ([place])   2. Uncontrolled hypertension   Cancer Staging Prostate cancer [facility]) Staging form: Prostate, AJCC 8th Edition - Clinical stage from 03/19/2018: Stage IVB (cTX, cN1, cM1b) - Signed by [name], MD on 05/01/2019  #Metastatic prostate Cancer, castration sensitive PSA 24.18--> 33.15-->52.75-->13.98--> 7.7--> 5.51 Labs reviewed and discussed with patient. Continue good response of PSA to treatments.  Hold chemotherapy treatments today.  Androgen deprivation therapy, received Lupron 22.5 mg on 07/03/2019.  Next Lupron injection due on 10/01/2019 Hypertension emergency, Patient reports being compliant with medication. Advised patient to go to emergency room for further evaluation and acute management of high blood pressure.  Discussed about risk of stroke and heart attack with extremely high blood pressure. Patient declines. We contacted patient's primary care provider[facility] and patient to see primary care provider for  further evaluation and management of blood pressure medications. Hold chemotherapy today. Patient to follow-up in 1 week with reevaluation prior to chemotherapy . #Patient is overdue for colon cancer screening, will refer to gastroenterology. Follow-up in 1 week  for next cycle of chemotherapy.   [name], MD, PhD Hematology Oncology [facility] at [facility] Pager- [phone] 09/07/2019

## 2019-09-08 ENCOUNTER — Inpatient Hospital Stay: Payer: Medicare HMO

## 2019-09-14 ENCOUNTER — Inpatient Hospital Stay (HOSPITAL_BASED_OUTPATIENT_CLINIC_OR_DEPARTMENT_OTHER): Payer: Medicare HMO | Admitting: Oncology

## 2019-09-14 ENCOUNTER — Inpatient Hospital Stay: Payer: Medicare HMO

## 2019-09-14 ENCOUNTER — Encounter: Payer: Self-pay | Admitting: Oncology

## 2019-09-14 ENCOUNTER — Other Ambulatory Visit: Payer: Self-pay

## 2019-09-14 VITALS — BP 164/89 | HR 79 | Temp 96.8°F | Resp 18 | Wt 209.4 lb

## 2019-09-14 DIAGNOSIS — I10 Essential (primary) hypertension: Secondary | ICD-10-CM

## 2019-09-14 DIAGNOSIS — Z1211 Encounter for screening for malignant neoplasm of colon: Secondary | ICD-10-CM

## 2019-09-14 DIAGNOSIS — C7951 Secondary malignant neoplasm of bone: Secondary | ICD-10-CM

## 2019-09-14 DIAGNOSIS — N1832 Chronic kidney disease, stage 3b: Secondary | ICD-10-CM | POA: Diagnosis not present

## 2019-09-14 DIAGNOSIS — Z5111 Encounter for antineoplastic chemotherapy: Secondary | ICD-10-CM | POA: Diagnosis not present

## 2019-09-14 DIAGNOSIS — C61 Malignant neoplasm of prostate: Secondary | ICD-10-CM

## 2019-09-14 LAB — CBC WITH DIFFERENTIAL/PLATELET
Abs Immature Granulocytes: 0.11 10*3/uL — ABNORMAL HIGH (ref 0.00–0.07)
Basophils Absolute: 0 10*3/uL (ref 0.0–0.1)
Basophils Relative: 0 %
Eosinophils Absolute: 0 10*3/uL (ref 0.0–0.5)
Eosinophils Relative: 0 %
HCT: 36.4 % — ABNORMAL LOW (ref 39.0–52.0)
Hemoglobin: 11.8 g/dL — ABNORMAL LOW (ref 13.0–17.0)
Immature Granulocytes: 1 %
Lymphocytes Relative: 2 %
Lymphs Abs: 0.3 10*3/uL — ABNORMAL LOW (ref 0.7–4.0)
MCH: 33.9 pg (ref 26.0–34.0)
MCHC: 32.4 g/dL (ref 30.0–36.0)
MCV: 104.6 fL — ABNORMAL HIGH (ref 80.0–100.0)
Monocytes Absolute: 0.7 10*3/uL (ref 0.1–1.0)
Monocytes Relative: 4 %
Neutro Abs: 18.3 10*3/uL — ABNORMAL HIGH (ref 1.7–7.7)
Neutrophils Relative %: 93 %
Platelets: 199 10*3/uL (ref 150–400)
RBC: 3.48 MIL/uL — ABNORMAL LOW (ref 4.22–5.81)
RDW: 14.6 % (ref 11.5–15.5)
WBC: 19.4 10*3/uL — ABNORMAL HIGH (ref 4.0–10.5)
nRBC: 0 % (ref 0.0–0.2)

## 2019-09-14 LAB — COMPREHENSIVE METABOLIC PANEL
ALT: 19 U/L (ref 0–44)
AST: 29 U/L (ref 15–41)
Albumin: 4.2 g/dL (ref 3.5–5.0)
Alkaline Phosphatase: 93 U/L (ref 38–126)
Anion gap: 11 (ref 5–15)
BUN: 39 mg/dL — ABNORMAL HIGH (ref 8–23)
CO2: 22 mmol/L (ref 22–32)
Calcium: 9.3 mg/dL (ref 8.9–10.3)
Chloride: 105 mmol/L (ref 98–111)
Creatinine, Ser: 1.39 mg/dL — ABNORMAL HIGH (ref 0.61–1.24)
GFR calc Af Amer: >60 mL/min (ref >60)
GFR calc non Af Amer: 52 mL/min — ABNORMAL LOW (ref >60)
Glucose, Bld: 142 mg/dL — ABNORMAL HIGH (ref 70–99)
Potassium: 4.3 mmol/L (ref 3.5–5.1)
Sodium: 138 mmol/L (ref 135–145)
Total Bilirubin: 0.9 mg/dL (ref 0.3–1.2)
Total Protein: 7.4 g/dL (ref 6.5–8.1)

## 2019-09-14 LAB — PSA: Prostatic Specific Antigen: 5.58 ng/mL — ABNORMAL HIGH (ref 0.00–4.00)

## 2019-09-14 MED ORDER — SODIUM CHLORIDE 0.9 % IV SOLN
Freq: Once | INTRAVENOUS | Status: AC
  Filled 2019-09-14: qty 250

## 2019-09-14 MED ORDER — SODIUM CHLORIDE 0.9 % IV SOLN
75.0000 mg/m2 | Freq: Once | INTRAVENOUS | Status: AC
  Administered 2019-09-14: 160 mg via INTRAVENOUS
  Filled 2019-09-14: qty 16

## 2019-09-14 MED ORDER — DEXAMETHASONE SODIUM PHOSPHATE 10 MG/ML IJ SOLN
10.0000 mg | Freq: Once | INTRAMUSCULAR | Status: AC
  Administered 2019-09-14: 11:00:00 10 mg via INTRAVENOUS
  Filled 2019-09-14: qty 1

## 2019-09-14 NOTE — Progress Notes (Signed)
Patient does not offer any problems today.  

## 2019-09-14 NOTE — Progress Notes (Signed)
Hematology/Oncology Follow up note [facility] Telephone:([phone] [phone]   Patient Care Team: [name], MD as PCP - General (Family Medicine) [name], MD as Consulting Physician (Hematology and Oncology)  REFERRING PROVIDER: [facility] REASON FOR VISIT:  Reestablish care for prostate cancer.  HISTORY OF PRESENTING ILLNESS:  Terry Mitchell is a  69 y.o.  male with PMH listed below who was referred to me for evaluation of elevated PSA. Patient was referred by primary care physician to urology due to elevated PSA at the level of 72.  Per note Patient was referred to urology for further management.  Staging imaging has been ordered including abdominal and pelvis CT with contrast and bone scan.  Patient was also given Mills Koller for treatment of clinical prostate cancer.  Patient reports that he was scheduled to return to urologist office for biopsy. Patient denies any dysuria, hematuria, fever or chills.  He lives by himself. Denies any bone pain.  Reports some soreness at the area of [place] shots, as well as hot flushes.  Otherwise no complaints.  #01/01/2018 Patient received Firmagon loading dose at urologist office  # patient had a prostate biopsy on 02/28/2018 pathology showed prostate adenocarcinoma with androgen deprivation treatment effect.  Adenocarcinoma is present in all core fragments with involvement of 50% or more of each core.  Perineural invasion is present. As patient has received Firmagon prior to biopsy, Gleason grade cannot be reliably assessed after androgen deprivation therapy.  #  discussed with patient's urologist Dr. Bernardo Heater who did patient's prostate biopsy.  He agrees that given Mills Koller prior to biopsy and staging images are not typical.  He agrees that PET scan is consistent with lymph node involvement.  He does not think patient is a good candidate for radical prostatectomy.  He agrees with me about the plan  that definitive radiation is a good option.  # September 2019  external Beam radiation. He was scheduled to have I-125 interstitial implant although that can not been done so patient underwent external beam IMRT prostate boost, finished in January 2020.  # patient was on androgen deprivation therapy with Firmagon injections, until August 2020. Declined ADT due to vasomotor symptoms.   #Blood work from 04/14/2019 showed testosterone level of 284, PSA has been decreased to 24.18.  # 04/28/2019 CT chest abdomen pelvis and bone scan images were independently reviewed by me and discussed with patient. Images are consistent with metastatic prostate cancer.  INTERVAL HISTORY Terry Mitchell is a 69 y.o. male who has above history reviewed by me today presents for follow-up visit for management of Stage IVA (cTX, cN1, cM1) castration sensitive metastatic prostate cancer Patient presents for evaluation prior to chemotherapy. Last chemotherapy was delayed due to uncontrolled hypertension. Patient was seen by [facility] clinic and was advised to increase to 10 mg amlodipine.  He may take 5 mg first, if blood pressure remains high, he may take another 5 mg of amlodipine. He has no new complaints today  Review of Systems  Constitutional: Positive for fatigue. Negative for appetite change, chills, fever and unexpected weight change.  HENT:   Negative for hearing loss and voice change.   Eyes: Negative for eye problems and icterus.  Respiratory: Negative for chest tightness, cough and shortness of breath.   Cardiovascular: Negative for chest pain and leg swelling.  Gastrointestinal: Negative for abdominal distention, abdominal pain and blood in stool.  Endocrine: Positive for hot flashes.  Genitourinary: Negative for difficulty urinating,  dysuria and frequency.   Musculoskeletal: Negative for arthralgias.  Skin: Negative for itching and rash.  Neurological: Negative for extremity weakness, light-headedness  and numbness.  Hematological: Negative for adenopathy. Does not bruise/bleed easily.  Psychiatric/Behavioral: Negative for confusion.     MEDICAL HISTORY:  Past Medical History:  Diagnosis Date  . Depression    recently went on disability d/t diagnosis  . History of recent fall 01/2018   missed the last step on ladder, causing back discomfort  . Hypertension   . Prostate cancer ([place]) 12/2017   prostate    SURGICAL HISTORY: Past Surgical History:  Procedure Laterality Date  . BACK SURGERY  2001    2 rods and 6 screws in back  . Left shoulder surgery Left 1998   removed a piece of bone around collar bone  . PROSTATE BIOPSY N/A 02/28/2018   Procedure: PROSTATE BIOPSY;  Surgeon: Abbie Sons, MD;  Location: [facility] ORS;  Service: Urology;  Laterality: N/A;  . TRANSRECTAL ULTRASOUND N/A 02/28/2018   Procedure: TRANSRECTAL ULTRASOUND;  Surgeon: Abbie Sons, MD;  Location: [facility] ORS;  Service: Urology;  Laterality: N/A;    SOCIAL HISTORY: Social History   Socioeconomic History  . Marital status: Single    Spouse name: Not on file  . Number of children: 4  . Years of education: Not on file  . Highest education level: Not on file  Occupational History  . Occupation: disability  Tobacco Use  . Smoking status: Current Every Day Smoker    Packs/day: 2.00    Types: Cigars  . Smokeless tobacco: Never Used  . Tobacco comment: 2 cigars  Substance and Sexual Activity  . Alcohol use: Yes    Alcohol/week: 9.0 standard drinks    Types: 9 Cans of beer per week    Comment: occasional  . Drug use: Yes    Types: Cocaine, "Crack" cocaine    Comment: last use about 3 days ago   . Sexual activity: Not Currently  Other Topics Concern  . Not on file  Social History Narrative  . Not on file   Social Determinants of Health   Financial Resource Strain:   . Difficulty of Paying Living Expenses: Not on file  Food Insecurity:   . Worried About Charity fundraiser in the Last Year:  Not on file  . Ran Out of Food in the Last Year: Not on file  Transportation Needs:   . Lack of Transportation (Medical): Not on file  . Lack of Transportation (Non-Medical): Not on file  Physical Activity:   . Days of Exercise per Week: Not on file  . Minutes of Exercise per Session: Not on file  Stress:   . Feeling of Stress : Not on file  Social Connections:   . Frequency of Communication with Friends and Family: Not on file  . Frequency of Social Gatherings with Friends and Family: Not on file  . Attends Religious Services: Not on file  . Active Member of Clubs or Organizations: Not on file  . Attends Archivist Meetings: Not on file  . Marital Status: Not on file  Intimate Partner Violence:   . Fear of Current or Ex-Partner: Not on file  . Emotionally Abused: Not on file  . Physically Abused: Not on file  . Sexually Abused: Not on file    FAMILY HISTORY: Family History  Problem Relation Age of Onset  . Stroke Mother   . Kidney failure Mother   .  Diabetes Maternal Aunt     ALLERGIES:  is allergic to penicillins and lactose intolerance (gi).  MEDICATIONS:  Current Outpatient Medications  Medication Sig Dispense Refill  . albuterol (VENTOLIN HFA) 108 (90 Base) MCG/ACT inhaler Inhale 2 puffs into the lungs every 6 (six) hours as needed for wheezing or shortness of breath. 6.7 g 0  . amLODipine (NORVASC) 5 MG tablet Take 1 tablet (5 mg total) by mouth daily. 30 tablet 0  . dexamethasone (DECADRON) 4 MG tablet Take 2 tablets (8 mg total) by mouth 2 (two) times daily. Start the day before Taxotere. Then daily after chemo for 2 days. 30 tablet 1  . loratadine (CLARITIN) 10 MG tablet Take 10 mg by mouth daily. In prep for fulphila    . losartan (COZAAR) 50 MG tablet Take 50 mg by mouth daily after breakfast.   2  . ondansetron (ZOFRAN) 8 MG tablet Take 1 tablet (8 mg total) by mouth 2 (two) times daily as needed for refractory nausea / vomiting. 30 tablet 1  .  prochlorperazine (COMPAZINE) 10 MG tablet Take 1 tablet (10 mg total) by mouth every 6 (six) hours as needed (Nausea or vomiting). 30 tablet 1  . vitamin B-12 (CYANOCOBALAMIN) 1000 MCG tablet Take 1 tablet (1,000 mcg total) by mouth daily. 90 tablet 1   No current facility-administered medications for this visit.   Facility-Administered Medications Ordered in Other Visits  Medication Dose Route Frequency Provider Last Rate Last Admin  . sodium chloride flush (NS) 0.9 % injection 10 mL  10 mL Intravenous Once [name], MD         PHYSICAL EXAMINATION: ECOG PERFORMANCE STATUS: 1 - Symptomatic but completely ambulatory Vitals:   09/14/19 1001  BP: (!) 164/89  Pulse: 79  Resp: 18  Temp: (!) 96.8 F (36 C)   Filed Weights   09/14/19 1001  Weight: 209 lb 6.4 oz (95 kg)    Physical Exam Constitutional:      General: He is not in acute distress. HENT:     Head: Normocephalic and atraumatic.  Eyes:     General: No scleral icterus.    Conjunctiva/sclera: Conjunctivae normal.     Pupils: Pupils are equal, round, and reactive to light.  Cardiovascular:     Rate and Rhythm: Normal rate and regular rhythm.     Heart sounds: Normal heart sounds.  Pulmonary:     Effort: Pulmonary effort is normal. No respiratory distress.     Breath sounds: No wheezing.  Abdominal:     General: Bowel sounds are normal. There is no distension.     Palpations: Abdomen is soft. There is no mass.     Tenderness: There is no abdominal tenderness.  Musculoskeletal:        General: No deformity. Normal range of motion.     Cervical back: Normal range of motion and neck supple.  Lymphadenopathy:     Cervical: No cervical adenopathy.  Skin:    General: Skin is warm and dry.     Findings: No erythema or rash.  Neurological:     Mental Status: He is alert and oriented to person, place, and time. Mental status is at baseline.     Cranial Nerves: No cranial nerve deficit.     Coordination: Coordination  normal.  Psychiatric:        Mood and Affect: Mood normal.        Behavior: Behavior normal.        Thought Content:  Thought content normal.      LABORATORY DATA:  I have reviewed the data as listed Lab Results  Component Value Date   WBC 19.4 (H) 09/14/2019   HGB 11.8 (L) 09/14/2019   HCT 36.4 (L) 09/14/2019   MCV 104.6 (H) 09/14/2019   PLT 199 09/14/2019   Recent Labs    08/17/19 0815 09/07/19 0833 09/14/19 0924  NA 137 136 138  K 4.3 4.2 4.3  CL 103 101 105  CO2 24 24 22   GLUCOSE 132* 120* 142*  BUN 29* 34* 39*  CREATININE 1.36* 1.28* 1.39*  CALCIUM 9.5 9.8 9.3  GFRNONAA 54* 58* 52*  GFRAA >60 >60 >60  PROT 7.1 7.4 7.4  ALBUMIN 4.2 4.2 4.2  AST 45* 28 29  ALT 20 20 19   ALKPHOS 93 82 93  BILITOT 0.9 1.0 0.9   Iron/TIBC/Ferritin/ %Sat No results found for: IRON, TIBC, FERRITIN, IRONPCTSAT   RADIOGRAPHIC STUDIES: I have personally reviewed the radiological images as listed and agreed with the findings in the report. No results found.   ASSESSMENT & PLAN:  1. Prostate cancer metastatic to bone ([place])   2. Encounter for antineoplastic chemotherapy   3. Stage 3b chronic kidney disease   4. Colon cancer screening   5. Uncontrolled hypertension   Cancer Staging Prostate cancer [facility]) Staging form: Prostate, AJCC 8th Edition - Clinical stage from 03/19/2018: Stage IVB (cTX, cN1, cM1b) - Signed by [name], MD on 05/01/2019  #Metastatic prostate Cancer, castration sensitive PSA 24.18--> 33.15-->52.75-->13.98--> 7.7--> 5.51 Labs reviewed and discussed with patient. Continue to have good response of PSA to current treatments. Counts are stable and acceptable to proceed with cycle 5 docetaxel today.  Patient will get growth factor support on day 3.. . Androgen deprivation therapy, received Lupron 22.5 mg on 07/03/2019.  Next Lupron injection due on 10/01/2019 #Hypertension, blood pressure is better controlled.  Continue follow-up with primary care provider.   Discussed about low-salt diet. #Leukocytosis, likely secondary to steroid use.  Continue to monitor. Patient to follow-up in 1 week with reevaluation prior to chemotherapy #CKD, avoid nephrotoxins. #Patient is overdue for colon cancer screening, Also some cecum activity on PET scan on 03/11/2018. Discussed at that time and later he lost follow up for about a year.  Discussed again now and he agrees with establish care with GI. Refer to Dr.Anna.  Follow-up in 3 weeks  for next cycle of chemotherapy.   [name], MD, PhD Hematology Oncology [facility] Pager- [phone] 09/14/2019

## 2019-09-15 ENCOUNTER — Inpatient Hospital Stay: Payer: Medicare HMO

## 2019-09-15 ENCOUNTER — Telehealth: Payer: Self-pay

## 2019-09-15 ENCOUNTER — Other Ambulatory Visit: Payer: Self-pay

## 2019-09-15 DIAGNOSIS — Z5111 Encounter for antineoplastic chemotherapy: Secondary | ICD-10-CM | POA: Diagnosis not present

## 2019-09-15 DIAGNOSIS — C61 Malignant neoplasm of prostate: Secondary | ICD-10-CM

## 2019-09-15 MED ORDER — PEGFILGRASTIM-JMDB 6 MG/0.6ML ~~LOC~~ SOSY
6.0000 mg | PREFILLED_SYRINGE | Freq: Once | SUBCUTANEOUS | Status: AC
  Administered 2019-09-15: 14:00:00 6 mg via SUBCUTANEOUS
  Filled 2019-09-15: qty 0.6

## 2019-09-15 NOTE — Telephone Encounter (Signed)
Gastroenterology Pre-Procedure Review  Request Date: 09/24/19 Requesting Physician: Dr. Vicente Males  PATIENT REVIEW QUESTIONS: The patient responded to the following health history questions as indicated:    1. Are you having any GI issues? no 2. Do you have a personal history of Polyps? no 3. Do you have a family history of Colon Cancer or Polyps? no 4. Diabetes Mellitus? no 5. Joint replacements in the past 12 months?no 6. Major health problems in the past 3 months?prostate and bone cancer recently 7. Any artificial heart valves, MVP, or defibrillator?no    MEDICATIONS & ALLERGIES:    Patient reports the following regarding taking any anticoagulation/antiplatelet therapy:   Plavix, Coumadin, Eliquis, Xarelto, Lovenox, Pradaxa, Brilinta, or Effient? no Aspirin? no  Patient confirms/reports the following medications:  Current Outpatient Medications  Medication Sig Dispense Refill  . albuterol (VENTOLIN HFA) 108 (90 Base) MCG/ACT inhaler Inhale 2 puffs into the lungs every 6 (six) hours as needed for wheezing or shortness of breath. 6.7 g 0  . amLODipine (NORVASC) 5 MG tablet Take 1 tablet (5 mg total) by mouth daily. 30 tablet 0  . dexamethasone (DECADRON) 4 MG tablet Take 2 tablets (8 mg total) by mouth 2 (two) times daily. Start the day before Taxotere. Then daily after chemo for 2 days. 30 tablet 1  . loratadine (CLARITIN) 10 MG tablet Take 10 mg by mouth daily. In prep for fulphila    . losartan (COZAAR) 50 MG tablet Take 50 mg by mouth daily after breakfast.   2  . ondansetron (ZOFRAN) 8 MG tablet Take 1 tablet (8 mg total) by mouth 2 (two) times daily as needed for refractory nausea / vomiting. 30 tablet 1  . prochlorperazine (COMPAZINE) 10 MG tablet Take 1 tablet (10 mg total) by mouth every 6 (six) hours as needed (Nausea or vomiting). 30 tablet 1  . vitamin B-12 (CYANOCOBALAMIN) 1000 MCG tablet Take 1 tablet (1,000 mcg total) by mouth daily. 90 tablet 1   No current  facility-administered medications for this visit.   Facility-Administered Medications Ordered in Other Visits  Medication Dose Route Frequency Provider Last Rate Last Admin  . sodium chloride flush (NS) 0.9 % injection 10 mL  10 mL Intravenous Once [name], MD        Patient confirms/reports the following allergies:  Allergies  Allergen Reactions  . Penicillins Hives and Other (See Comments)    Hives, welts, swelling profusely . Pretty severe reaction Has patient had a PCN reaction causing immediate rash, facial/tongue/throat swelling, SOB or lightheadedness with hypotension: No Has patient had a PCN reaction causing severe rash involving mucus membranes or skin necrosis: Yes Has patient had a PCN reaction that required hospitalization: Yes Has patient had a PCN reaction occurring within the last 10 years: No If all of the above answers are "NO", then may proceed with Cephalosporin use.   . Lactose Intolerance (Gi) Other (See Comments)    Gi upset    No orders of the defined types were placed in this encounter.   AUTHORIZATION INFORMATION Primary Insurance: 1D#: Group #:  Secondary Insurance: 1D#: Group #:  SCHEDULE INFORMATION: Date: 09/24/19 Time: Location:[facility]

## 2019-09-16 ENCOUNTER — Other Ambulatory Visit: Payer: Self-pay

## 2019-09-16 DIAGNOSIS — Z1211 Encounter for screening for malignant neoplasm of colon: Secondary | ICD-10-CM

## 2019-09-22 ENCOUNTER — Other Ambulatory Visit
Admission: RE | Admit: 2019-09-22 | Discharge: 2019-09-22 | Disposition: A | Discharge status: Home / Self Care | Payer: Medicare HMO | Source: Ambulatory Visit | Attending: Gastroenterology | Admitting: Gastroenterology

## 2019-09-22 DIAGNOSIS — Z20822 Contact with and (suspected) exposure to covid-19: Secondary | ICD-10-CM | POA: Diagnosis not present

## 2019-09-22 DIAGNOSIS — Z01812 Encounter for preprocedural laboratory examination: Secondary | ICD-10-CM | POA: Insufficient documentation

## 2019-09-22 LAB — SARS CORONAVIRUS 2 (TAT 6-24 HRS): SARS Coronavirus 2: NEGATIVE

## 2019-09-24 ENCOUNTER — Encounter: Payer: Self-pay | Admitting: Gastroenterology

## 2019-09-24 ENCOUNTER — Ambulatory Visit
Admission: RE | Admit: 2019-09-24 | Discharge: 2019-09-24 | Disposition: A | Discharge status: Home / Self Care | Payer: Medicare HMO | Attending: Gastroenterology | Admitting: Gastroenterology

## 2019-09-24 ENCOUNTER — Other Ambulatory Visit: Payer: Self-pay

## 2019-09-24 ENCOUNTER — Encounter
Admission: RE | Disposition: A | Discharge status: Home / Self Care | Payer: Self-pay | Source: Home / Self Care | Attending: Gastroenterology

## 2019-09-24 ENCOUNTER — Ambulatory Visit: Payer: Medicare HMO | Admitting: Registered Nurse

## 2019-09-24 DIAGNOSIS — Z8546 Personal history of malignant neoplasm of prostate: Secondary | ICD-10-CM | POA: Insufficient documentation

## 2019-09-24 DIAGNOSIS — K635 Polyp of colon: Secondary | ICD-10-CM

## 2019-09-24 DIAGNOSIS — Z87891 Personal history of nicotine dependence: Secondary | ICD-10-CM | POA: Insufficient documentation

## 2019-09-24 DIAGNOSIS — Z1211 Encounter for screening for malignant neoplasm of colon: Secondary | ICD-10-CM | POA: Diagnosis present

## 2019-09-24 DIAGNOSIS — D124 Benign neoplasm of descending colon: Secondary | ICD-10-CM | POA: Diagnosis not present

## 2019-09-24 DIAGNOSIS — J449 Chronic obstructive pulmonary disease, unspecified: Secondary | ICD-10-CM | POA: Insufficient documentation

## 2019-09-24 DIAGNOSIS — I10 Essential (primary) hypertension: Secondary | ICD-10-CM | POA: Diagnosis not present

## 2019-09-24 HISTORY — PX: COLONOSCOPY WITH PROPOFOL: SHX5780

## 2019-09-24 LAB — URINE DRUG SCREEN, QUALITATIVE (ARMC ONLY)
Amphetamines, Ur Screen: NOT DETECTED
Barbiturates, Ur Screen: NOT DETECTED
Benzodiazepine, Ur Scrn: NOT DETECTED
Cannabinoid 50 Ng, Ur ~~LOC~~: NOT DETECTED
Cocaine Metabolite,Ur ~~LOC~~: NOT DETECTED
MDMA (Ecstasy)Ur Screen: NOT DETECTED
Methadone Scn, Ur: NOT DETECTED
Opiate, Ur Screen: NOT DETECTED
Phencyclidine (PCP) Ur S: NOT DETECTED
Tricyclic, Ur Screen: NOT DETECTED

## 2019-09-24 SURGERY — COLONOSCOPY WITH PROPOFOL
Anesthesia: General

## 2019-09-24 MED ORDER — SODIUM CHLORIDE 0.9 % IV SOLN: INTRAVENOUS | Status: DC

## 2019-09-24 MED ORDER — LIDOCAINE HCL (CARDIAC) PF 100 MG/5ML IV SOSY
PREFILLED_SYRINGE | INTRAVENOUS | Status: DC | PRN
  Administered 2019-09-24: 80 mg via INTRAVENOUS

## 2019-09-24 MED ORDER — PROPOFOL 500 MG/50ML IV EMUL
INTRAVENOUS | Status: AC
  Filled 2019-09-24: qty 50

## 2019-09-24 MED ORDER — PROPOFOL 500 MG/50ML IV EMUL
INTRAVENOUS | Status: DC | PRN
  Administered 2019-09-24: 150 ug/kg/min via INTRAVENOUS

## 2019-09-24 MED ORDER — PROPOFOL 10 MG/ML IV BOLUS
INTRAVENOUS | Status: DC | PRN
  Administered 2019-09-24: 90 mg via INTRAVENOUS

## 2019-09-24 NOTE — H&P (Signed)
Jonathon Bellows, MD [address] [place], [place], [place], [address] [address], [place], [place], [place], [address] Phone: [phone]  Fax: [phone]  Primary Care Physician:  [name], MD   Pre-Procedure History & Physical: HPI:  Terry Mitchell is a 69 y.o. male is here for an colonoscopy.   Past Medical History:  Diagnosis Date  . Depression    recently went on disability d/t diagnosis  . History of recent fall 01/2018   missed the last step on ladder, causing back discomfort  . Hypertension   . Prostate cancer ([place]) 12/2017   prostate    Past Surgical History:  Procedure Laterality Date  . BACK SURGERY  2001    2 rods and 6 screws in back  . Left shoulder surgery Left 1998   removed a piece of bone around collar bone  . PROSTATE BIOPSY N/A 02/28/2018   Procedure: PROSTATE BIOPSY;  Surgeon: Abbie Sons, MD;  Location: [facility] ORS;  Service: Urology;  Laterality: N/A;  . TRANSRECTAL ULTRASOUND N/A 02/28/2018   Procedure: TRANSRECTAL ULTRASOUND;  Surgeon: Abbie Sons, MD;  Location: [facility] ORS;  Service: Urology;  Laterality: N/A;    Prior to Admission medications   Medication Sig Start Date End Date Taking? Authorizing Provider  albuterol (VENTOLIN HFA) 108 (90 Base) MCG/ACT inhaler Inhale 2 puffs into the lungs every 6 (six) hours as needed for wheezing or shortness of breath. 05/22/19  Yes [name], MD  amLODipine (NORVASC) 5 MG tablet Take 1 tablet (5 mg total) by mouth daily. 06/26/19  Yes [name], MD  losartan (COZAAR) 50 MG tablet Take 50 mg by mouth daily after breakfast.  03/06/18  Yes [provider]  vitamin B-12 (CYANOCOBALAMIN) 1000 MCG tablet Take 1 tablet (1,000 mcg total) by mouth daily. 05/15/19  Yes [name], MD  dexamethasone (DECADRON) 4 MG tablet Take 2 tablets (8 mg total) by mouth 2 (two) times daily. Start the day before Taxotere. Then daily after chemo for 2 days. 05/01/19   [name], MD  loratadine  (CLARITIN) 10 MG tablet Take 10 mg by mouth daily. In prep for fulphila    [provider]  ondansetron (ZOFRAN) 8 MG tablet Take 1 tablet (8 mg total) by mouth 2 (two) times daily as needed for refractory nausea / vomiting. 05/01/19   [name], MD  prochlorperazine (COMPAZINE) 10 MG tablet Take 1 tablet (10 mg total) by mouth every 6 (six) hours as needed (Nausea or vomiting). 05/01/19   [name], MD    Allergies as of 09/16/2019 - Review Complete 09/14/2019  Allergen Reaction Noted  . Penicillins Hives and Other (See Comments) 05/29/2017  . Lactose intolerance (gi) Other (See Comments) 02/11/2018    Family History  Problem Relation Age of Onset  . Stroke Mother   . Kidney failure Mother   . Diabetes Maternal Aunt     Social History   Socioeconomic History  . Marital status: Single    Spouse name: Not on file  . Number of children: 4  . Years of education: Not on file  . Highest education level: Not on file  Occupational History  . Occupation: disability  Tobacco Use  . Smoking status: Former Smoker    Packs/day: 2.00    Types: Cigars    Quit date: 07/24/2019    Years since quitting: 0.1  . Smokeless tobacco: Never Used  . Tobacco comment:  2 cigars  Substance and Sexual Activity  . Alcohol use: Yes    Alcohol/week: 9.0 standard drinks    Types: 9 Cans of beer per week    Comment: occasional  . Drug use: Yes    Types: Cocaine, "Crack" cocaine    Comment: none within the year  . Sexual activity: Not Currently  Other Topics Concern  . Not on file  Social History Narrative  . Not on file   Social Determinants of Health   Financial Resource Strain:   . Difficulty of Paying Living Expenses: Not on file  Food Insecurity:   . Worried About Charity fundraiser in the Last Year: Not on file  . Ran Out of Food in the Last Year: Not on file  Transportation Needs:   . Lack of Transportation (Medical): Not on file  . Lack of Transportation (Non-Medical): Not on file   Physical Activity:   . Days of Exercise per Week: Not on file  . Minutes of Exercise per Session: Not on file  Stress:   . Feeling of Stress : Not on file  Social Connections:   . Frequency of Communication with Friends and Family: Not on file  . Frequency of Social Gatherings with Friends and Family: Not on file  . Attends Religious Services: Not on file  . Active Member of Clubs or Organizations: Not on file  . Attends Archivist Meetings: Not on file  . Marital Status: Not on file  Intimate Partner Violence:   . Fear of Current or Ex-Partner: Not on file  . Emotionally Abused: Not on file  . Physically Abused: Not on file  . Sexually Abused: Not on file    Review of Systems: See HPI, otherwise negative ROS  Physical Exam: BP 131/79   Pulse 70   Temp (!) 97.1 F (36.2 C) (Temporal)   Resp 16   Ht 6' (1.829 m)   Wt 95.3 kg   SpO2 99%   BMI 28.48 kg/m  General:   Alert,  pleasant and cooperative in NAD Head:  Normocephalic and atraumatic. Neck:  Supple; no masses or thyromegaly. Lungs:  Clear throughout to auscultation, normal respiratory effort.    Heart:  +S1, +S2, Regular rate and rhythm, No edema. Abdomen:  Soft, nontender and nondistended. Normal bowel sounds, without guarding, and without rebound.   Neurologic:  Alert and  oriented x4;  grossly normal neurologically.  Impression/Plan: Terry Mitchell is here for an colonoscopy to be performed for Screening colonoscopy average risk   Risks, benefits, limitations, and alternatives regarding  colonoscopy have been reviewed with the patient.  Questions have been answered.  All parties agreeable.   Jonathon Bellows, MD  09/24/2019, 10:56 AM

## 2019-09-24 NOTE — Anesthesia Postprocedure Evaluation (Signed)
Anesthesia Post Note  Patient: Terry Mitchell  Procedure(s) Performed: COLONOSCOPY WITH PROPOFOL (N/A )  Patient location during evaluation: Endoscopy Anesthesia Type: General Level of consciousness: awake and alert Pain management: pain level controlled Vital Signs Assessment: post-procedure vital signs reviewed and stable Respiratory status: spontaneous breathing and respiratory function stable Cardiovascular status: stable Anesthetic complications: no     Last Vitals:  Vitals:   09/24/19 0928 09/24/19 1120  BP: 131/79 99/64  Pulse: 70 63  Resp: 16 (!) 23  Temp: (!) 36.2 C (!) 36.2 C  SpO2: 99% 98%    Last Pain:  Vitals:   09/24/19 1120  TempSrc: Temporal  PainSc:                  [name]

## 2019-09-24 NOTE — Anesthesia Preprocedure Evaluation (Signed)
Anesthesia Evaluation  Patient identified by MRN, date of birth, ID band Patient awake    Reviewed: Allergy & Precautions, NPO status , Patient's Chart, lab work & pertinent test results  History of Anesthesia Complications Negative for: history of anesthetic complications  Airway Mallampati: II       Dental  (+) Poor Dentition, Chipped, Missing   Pulmonary neg sleep apnea, COPD,  COPD inhaler, Current Smoker and Patient abstained from smoking.,           Cardiovascular hypertension, Pt. on medications (-) Past MI and (-) CHF (-) dysrhythmias (-) Valvular Problems/Murmurs     Neuro/Psych neg Seizures Depression    GI/Hepatic Neg liver ROS, neg GERD  ,  Endo/Other  neg diabetes  Renal/GU negative Renal ROS     Musculoskeletal   Abdominal   Peds  Hematology   Anesthesia Other Findings   Reproductive/Obstetrics                             Anesthesia Physical Anesthesia Plan  ASA: III  Anesthesia Plan: General   Post-op Pain Management:    Induction: Intravenous  PONV Risk Score and Plan: 1 and Propofol infusion and TIVA  Airway Management Planned: Nasal Cannula  Additional Equipment:   Intra-op Plan:   Post-operative Plan:   Informed Consent: I have reviewed the patients History and Physical, chart, labs and discussed the procedure including the risks, benefits and alternatives for the proposed anesthesia with the patient or authorized representative who has indicated his/her understanding and acceptance.       Plan Discussed with:   Anesthesia Plan Comments:         Anesthesia Quick Evaluation

## 2019-09-24 NOTE — Op Note (Signed)
[facility] Patient Name: Terry Mitchell Procedure Date: 09/24/2019 11:02 AM MRN: [id number] Account #: [id number] Date of Birth: 11-Jul-1951 Admit Type: Outpatient Age: 69 Room: [facility] ENDO ROOM 2 Gender: Male Note Status: Finalized Procedure:             Colonoscopy Indications:           Screening for colorectal malignant neoplasm Providers:             Jonathon Bellows MD, MD Referring MD:          Hattie Perch. Sherril Cong (Referring MD) Medicines:             Monitored Anesthesia Care Complications:         No immediate complications. Procedure:             Pre-Anesthesia Assessment:                        - Prior to the procedure, a History and Physical was                         performed, and patient medications, allergies and                         sensitivities were reviewed. The patient's tolerance                         of previous anesthesia was reviewed.                        - The risks and benefits of the procedure and the                         sedation options and risks were discussed with the                         patient. All questions were answered and informed                         consent was obtained.                        - ASA Grade Assessment: II - A patient with mild                         systemic disease.                        After obtaining informed consent, the colonoscope was                         passed under direct vision. Throughout the procedure,                         the patient's blood pressure, pulse, and oxygen                         saturations were monitored continuously. The                         Colonoscope was introduced through the anus and  advanced to the the cecum, identified by the                         appendiceal orifice. The colonoscopy was performed                         with ease. The patient tolerated the procedure well.                         The quality of the  bowel preparation was excellent. Findings:      The perianal and digital rectal examinations were normal.      A 5 mm polyp was found in the descending colon. The polyp was sessile.       The polyp was removed with a cold snare. Resection and retrieval were       complete.      The exam was otherwise without abnormality on direct and retroflexion       views. Impression:            - One 5 mm polyp in the descending colon, removed with                         a cold snare. Resected and retrieved.                        - The examination was otherwise normal on direct and                         retroflexion views. Recommendation:        - Discharge patient to home (with escort).                        - Resume previous diet.                        - Continue present medications.                        - Await pathology results.                        - Repeat colonoscopy for surveillance based on                         pathology results. Procedure Code(s):     --- Professional ---                        [phone], Colonoscopy, flexible; with removal of                         tumor(s), polyp(s), or other lesion(s) by snare                         technique Diagnosis Code(s):     --- Professional ---                        Z12.11, Encounter for screening for malignant neoplasm  of colon                        K63.5, Polyp of colon CPT copyright 2019 American Medical Association. All rights reserved. The codes documented in this report are preliminary and upon coder review may  be revised to meet current compliance requirements. Jonathon Bellows, MD Jonathon Bellows MD, MD 09/24/2019 11:25:32 AM This report has been signed electronically. Number of Addenda: 0 Note Initiated On: 09/24/2019 11:02 AM Scope Withdrawal Time: 0 hours 12 minutes 26 seconds  Total Procedure Duration: 0 hours 17 minutes 12 seconds  Estimated Blood Loss:  Estimated blood loss: none.      [facility]

## 2019-09-24 NOTE — Transfer of Care (Signed)
Immediate Anesthesia Transfer of Care Note  Patient: Terry Mitchell  Procedure(s) Performed: COLONOSCOPY WITH PROPOFOL (N/A )  Patient Location: PACU  Anesthesia Type:General  Level of Consciousness: sedated  Airway & Oxygen Therapy: Patient Spontanous Breathing and Patient connected to nasal cannula oxygen  Post-op Assessment: Report given to RN and Post -op Vital signs reviewed and stable  Post vital signs: Reviewed and stable  Last Vitals:  Vitals Value Taken Time  BP    Temp    Pulse 63 09/24/19 1129  Resp 23 09/24/19 1129  SpO2 99 % 09/24/19 1129  Vitals shown include unvalidated device data.  Last Pain:  Vitals:   09/24/19 0928  TempSrc: Temporal  PainSc: 0-No pain         Complications: No apparent anesthesia complications

## 2019-09-25 ENCOUNTER — Encounter: Payer: Self-pay | Admitting: *Deleted

## 2019-09-25 LAB — SURGICAL PATHOLOGY

## 2019-09-29 ENCOUNTER — Encounter: Payer: Self-pay | Admitting: Gastroenterology

## 2019-10-02 ENCOUNTER — Encounter: Payer: Self-pay | Admitting: Oncology

## 2019-10-02 NOTE — Progress Notes (Signed)
Patient prescreened for appointment. No concerns voiced.  

## 2019-10-05 ENCOUNTER — Inpatient Hospital Stay: Payer: Medicare HMO | Attending: Oncology

## 2019-10-05 ENCOUNTER — Inpatient Hospital Stay (HOSPITAL_BASED_OUTPATIENT_CLINIC_OR_DEPARTMENT_OTHER): Payer: Medicare HMO | Admitting: Oncology

## 2019-10-05 ENCOUNTER — Inpatient Hospital Stay: Payer: Medicare HMO

## 2019-10-05 VITALS — BP 173/83 | HR 84 | Temp 96.4°F | Resp 16 | Wt 215.5 lb

## 2019-10-05 DIAGNOSIS — I129 Hypertensive chronic kidney disease with stage 1 through stage 4 chronic kidney disease, or unspecified chronic kidney disease: Secondary | ICD-10-CM | POA: Diagnosis not present

## 2019-10-05 DIAGNOSIS — D539 Nutritional anemia, unspecified: Secondary | ICD-10-CM | POA: Insufficient documentation

## 2019-10-05 DIAGNOSIS — R232 Flushing: Secondary | ICD-10-CM | POA: Insufficient documentation

## 2019-10-05 DIAGNOSIS — N1832 Chronic kidney disease, stage 3b: Secondary | ICD-10-CM | POA: Diagnosis not present

## 2019-10-05 DIAGNOSIS — C7951 Secondary malignant neoplasm of bone: Secondary | ICD-10-CM | POA: Diagnosis not present

## 2019-10-05 DIAGNOSIS — R5383 Other fatigue: Secondary | ICD-10-CM | POA: Diagnosis not present

## 2019-10-05 DIAGNOSIS — Z5111 Encounter for antineoplastic chemotherapy: Secondary | ICD-10-CM | POA: Diagnosis not present

## 2019-10-05 DIAGNOSIS — F1490 Cocaine use, unspecified, uncomplicated: Secondary | ICD-10-CM | POA: Insufficient documentation

## 2019-10-05 DIAGNOSIS — Z79899 Other long term (current) drug therapy: Secondary | ICD-10-CM | POA: Diagnosis not present

## 2019-10-05 DIAGNOSIS — Z880 Allergy status to penicillin: Secondary | ICD-10-CM | POA: Diagnosis not present

## 2019-10-05 DIAGNOSIS — IMO0001 Reserved for inherently not codable concepts without codable children: Secondary | ICD-10-CM

## 2019-10-05 DIAGNOSIS — C61 Malignant neoplasm of prostate: Secondary | ICD-10-CM | POA: Diagnosis not present

## 2019-10-05 DIAGNOSIS — Z841 Family history of disorders of kidney and ureter: Secondary | ICD-10-CM | POA: Insufficient documentation

## 2019-10-05 DIAGNOSIS — Z87891 Personal history of nicotine dependence: Secondary | ICD-10-CM | POA: Insufficient documentation

## 2019-10-05 DIAGNOSIS — K521 Toxic gastroenteritis and colitis: Secondary | ICD-10-CM | POA: Diagnosis not present

## 2019-10-05 DIAGNOSIS — Z79818 Long term (current) use of other agents affecting estrogen receptors and estrogen levels: Secondary | ICD-10-CM

## 2019-10-05 DIAGNOSIS — Z823 Family history of stroke: Secondary | ICD-10-CM | POA: Insufficient documentation

## 2019-10-05 DIAGNOSIS — Z7289 Other problems related to lifestyle: Secondary | ICD-10-CM | POA: Diagnosis not present

## 2019-10-05 DIAGNOSIS — Z5189 Encounter for other specified aftercare: Secondary | ICD-10-CM | POA: Insufficient documentation

## 2019-10-05 DIAGNOSIS — T451X5A Adverse effect of antineoplastic and immunosuppressive drugs, initial encounter: Secondary | ICD-10-CM | POA: Diagnosis not present

## 2019-10-05 DIAGNOSIS — R972 Elevated prostate specific antigen [PSA]: Secondary | ICD-10-CM

## 2019-10-05 DIAGNOSIS — Z833 Family history of diabetes mellitus: Secondary | ICD-10-CM | POA: Diagnosis not present

## 2019-10-05 LAB — COMPREHENSIVE METABOLIC PANEL
ALT: 16 U/L (ref 0–44)
AST: 26 U/L (ref 15–41)
Albumin: 4 g/dL (ref 3.5–5.0)
Alkaline Phosphatase: 96 U/L (ref 38–126)
Anion gap: 10 (ref 5–15)
BUN: 31 mg/dL — ABNORMAL HIGH (ref 8–23)
CO2: 22 mmol/L (ref 22–32)
Calcium: 9 mg/dL (ref 8.9–10.3)
Chloride: 104 mmol/L (ref 98–111)
Creatinine, Ser: 1.19 mg/dL (ref 0.61–1.24)
GFR calc Af Amer: >60 mL/min (ref >60)
GFR calc non Af Amer: >60 mL/min (ref >60)
Glucose, Bld: 127 mg/dL — ABNORMAL HIGH (ref 70–99)
Potassium: 3.9 mmol/L (ref 3.5–5.1)
Sodium: 136 mmol/L (ref 135–145)
Total Bilirubin: 0.6 mg/dL (ref 0.3–1.2)
Total Protein: 7.2 g/dL (ref 6.5–8.1)

## 2019-10-05 LAB — CBC WITH DIFFERENTIAL/PLATELET
Abs Immature Granulocytes: 0.03 10*3/uL (ref 0.00–0.07)
Basophils Absolute: 0 10*3/uL (ref 0.0–0.1)
Basophils Relative: 0 %
Eosinophils Absolute: 0 10*3/uL (ref 0.0–0.5)
Eosinophils Relative: 0 %
HCT: 34 % — ABNORMAL LOW (ref 39.0–52.0)
Hemoglobin: 11.1 g/dL — ABNORMAL LOW (ref 13.0–17.0)
Immature Granulocytes: 1 %
Lymphocytes Relative: 9 %
Lymphs Abs: 0.6 10*3/uL — ABNORMAL LOW (ref 0.7–4.0)
MCH: 33.5 pg (ref 26.0–34.0)
MCHC: 32.6 g/dL (ref 30.0–36.0)
MCV: 102.7 fL — ABNORMAL HIGH (ref 80.0–100.0)
Monocytes Absolute: 0.5 10*3/uL (ref 0.1–1.0)
Monocytes Relative: 9 %
Neutro Abs: 5.3 10*3/uL (ref 1.7–7.7)
Neutrophils Relative %: 81 %
Platelets: 298 10*3/uL (ref 150–400)
RBC: 3.31 MIL/uL — ABNORMAL LOW (ref 4.22–5.81)
RDW: 14.7 % (ref 11.5–15.5)
WBC: 6.4 10*3/uL (ref 4.0–10.5)
nRBC: 0 % (ref 0.0–0.2)

## 2019-10-05 LAB — PSA: Prostatic Specific Antigen: 6.02 ng/mL — ABNORMAL HIGH (ref 0.00–4.00)

## 2019-10-05 MED ORDER — DEXAMETHASONE SODIUM PHOSPHATE 10 MG/ML IJ SOLN
10.0000 mg | Freq: Once | INTRAMUSCULAR | Status: AC
  Administered 2019-10-05: 10 mg via INTRAVENOUS
  Filled 2019-10-05: qty 1

## 2019-10-05 MED ORDER — LEUPROLIDE ACETATE (3 MONTH) 22.5 MG ~~LOC~~ KIT
22.5000 mg | PACK | Freq: Once | SUBCUTANEOUS | Status: AC
  Administered 2019-10-05: 22.5 mg via SUBCUTANEOUS
  Filled 2019-10-05: qty 22.5

## 2019-10-05 MED ORDER — SODIUM CHLORIDE 0.9 % IV SOLN
Freq: Once | INTRAVENOUS | Status: AC
  Filled 2019-10-05: qty 250

## 2019-10-05 MED ORDER — SODIUM CHLORIDE 0.9 % IV SOLN
75.0000 mg/m2 | Freq: Once | INTRAVENOUS | Status: AC
  Administered 2019-10-05: 160 mg via INTRAVENOUS
  Filled 2019-10-05: qty 16

## 2019-10-05 NOTE — Progress Notes (Signed)
Hematology/Oncology Follow up note [facility] [phone] [phone]   Patient Care Team: [name], MD as PCP - General (Family Medicine) [name], MD as Consulting Physician (Hematology and Oncology)  REFERRING PROVIDER: [facility] REASON FOR VISIT:  Reestablish care for prostate cancer.  HISTORY OF PRESENTING ILLNESS:  Terry Mitchell is a  69 y.o.  male with PMH listed below who was referred to me for evaluation of elevated PSA. Patient was referred by primary care physician to urology due to elevated PSA at the level of 72.  Per note Patient was referred to urology for further management.  Staging imaging has been ordered including abdominal and pelvis CT with contrast and bone scan.  Patient was also given Mills Koller for treatment of clinical prostate cancer.  Patient reports that he was scheduled to return to urologist office for biopsy. Patient denies any dysuria, hematuria, fever or chills.  He lives by himself. Denies any bone pain.  Reports some soreness at the area of [place] shots, as well as hot flushes.  Otherwise no complaints.  #01/01/2018 Patient received Firmagon loading dose at urologist office  # patient had a prostate biopsy on 02/28/2018 pathology showed prostate adenocarcinoma with androgen deprivation treatment effect.  Adenocarcinoma is present in all core fragments with involvement of 50% or more of each core.  Perineural invasion is present. As patient has received Firmagon prior to biopsy, Gleason grade cannot be reliably assessed after androgen deprivation therapy.  #  discussed with patient's urologist Dr. Bernardo Heater who did patient's prostate biopsy.  He agrees that given Mills Koller prior to biopsy and staging images are not typical.  He agrees that PET scan is consistent with lymph node involvement.  He does not think patient is a good candidate for radical prostatectomy.  He agrees with me about the plan  that definitive radiation is a good option.  # September 2019  external Beam radiation. He was scheduled to have I-125 interstitial implant although that can not been done so patient underwent external beam IMRT prostate boost, finished in January 2020.  # patient was on androgen deprivation therapy with Firmagon injections, until August 2020. Declined ADT due to vasomotor symptoms.   #Blood work from 04/14/2019 showed testosterone level of 284, PSA has been decreased to 24.18.  # 04/28/2019 CT chest abdomen pelvis and bone scan images were independently reviewed by me and discussed with patient. Images are consistent with metastatic prostate cancer.  INTERVAL HISTORY Terry Mitchell is a 69 y.o. male who has above history reviewed by me today presents for follow-up visit for management of Stage IVA (cTX, cN1, cM1) castration sensitive metastatic prostate cancer Patient presents for evaluation prior to chemotherapy. Patient reports feeling well today.  He had a few days of diarrhea after last chemotherapy.  Spontaneously resolved. He has no new complaints today. He monitors blood pressure at home.  His blood pressure is always high in the morning and normalized in the afternoon. During the interval, also status post colonoscopy.  Descending colon polyp, resected.  Pathology showed tubular adenoma.  Negative for high-grade dysplasia or malignancy. Review of Systems  Constitutional: Positive for fatigue. Negative for appetite change, chills, fever and unexpected weight change.  HENT:   Negative for hearing loss and voice change.   Eyes: Negative for eye problems and icterus.  Respiratory: Negative for chest tightness, cough and shortness of breath.   Cardiovascular: Negative for chest pain and leg swelling.  Gastrointestinal: Negative for  abdominal distention, abdominal pain and blood in stool.  Endocrine: Positive for hot flashes.  Genitourinary: Negative for difficulty urinating, dysuria and  frequency.   Musculoskeletal: Negative for arthralgias.  Skin: Negative for itching and rash.  Neurological: Negative for extremity weakness, light-headedness and numbness.  Hematological: Negative for adenopathy. Does not bruise/bleed easily.  Psychiatric/Behavioral: Negative for confusion.     MEDICAL HISTORY:  Past Medical History:  Diagnosis Date  . Depression    recently went on disability d/t diagnosis  . History of recent fall 01/2018   missed the last step on ladder, causing back discomfort  . Hypertension   . Prostate cancer ([place]) 12/2017   prostate    SURGICAL HISTORY: Past Surgical History:  Procedure Laterality Date  . BACK SURGERY  2001    2 rods and 6 screws in back  . COLONOSCOPY WITH PROPOFOL N/A 09/24/2019   Procedure: COLONOSCOPY WITH PROPOFOL;  Surgeon: Jonathon Bellows, MD;  Location: [facility] ENDOSCOPY;  Service: Gastroenterology;  Laterality: N/A;  . Left shoulder surgery Left 1998   removed a piece of bone around collar bone  . PROSTATE BIOPSY N/A 02/28/2018   Procedure: PROSTATE BIOPSY;  Surgeon: Abbie Sons, MD;  Location: [facility] ORS;  Service: Urology;  Laterality: N/A;  . TRANSRECTAL ULTRASOUND N/A 02/28/2018   Procedure: TRANSRECTAL ULTRASOUND;  Surgeon: Abbie Sons, MD;  Location: [facility] ORS;  Service: Urology;  Laterality: N/A;    SOCIAL HISTORY: Social History   Socioeconomic History  . Marital status: Single    Spouse name: Not on file  . Number of children: 4  . Years of education: Not on file  . Highest education level: Not on file  Occupational History  . Occupation: disability  Tobacco Use  . Smoking status: Former Smoker    Packs/day: 2.00    Types: Cigars    Quit date: 07/24/2019    Years since quitting: 0.2  . Smokeless tobacco: Never Used  . Tobacco comment: 2 cigars  Substance and Sexual Activity  . Alcohol use: Yes    Alcohol/week: 9.0 standard drinks    Types: 9 Cans of beer per week    Comment: occasional  . Drug use: Yes     Types: Cocaine, "Crack" cocaine    Comment: none within the year  . Sexual activity: Not Currently  Other Topics Concern  . Not on file  Social History Narrative  . Not on file   Social Determinants of Health   Financial Resource Strain:   . Difficulty of Paying Living Expenses:   Food Insecurity:   . Worried About Charity fundraiser in the Last Year:   . Arboriculturist in the Last Year:   Transportation Needs:   . Film/video editor (Medical):   Marland Kitchen Lack of Transportation (Non-Medical):   Physical Activity:   . Days of Exercise per Week:   . Minutes of Exercise per Session:   Stress:   . Feeling of Stress :   Social Connections:   . Frequency of Communication with Friends and Family:   . Frequency of Social Gatherings with Friends and Family:   . Attends Religious Services:   . Active Member of Clubs or Organizations:   . Attends Archivist Meetings:   Marland Kitchen Marital Status:   Intimate Partner Violence:   . Fear of Current or Ex-Partner:   . Emotionally Abused:   Marland Kitchen Physically Abused:   . Sexually Abused:     FAMILY HISTORY: Family  History  Problem Relation Age of Onset  . Stroke Mother   . Kidney failure Mother   . Diabetes Maternal Aunt     ALLERGIES:  is allergic to penicillins and lactose intolerance (gi).  MEDICATIONS:  Current Outpatient Medications  Medication Sig Dispense Refill  . albuterol (VENTOLIN HFA) 108 (90 Base) MCG/ACT inhaler Inhale 2 puffs into the lungs every 6 (six) hours as needed for wheezing or shortness of breath. 6.7 g 0  . amLODipine (NORVASC) 5 MG tablet Take 1 tablet (5 mg total) by mouth daily. 30 tablet 0  . dexamethasone (DECADRON) 4 MG tablet Take 2 tablets (8 mg total) by mouth 2 (two) times daily. Start the day before Taxotere. Then daily after chemo for 2 days. 30 tablet 1  . loratadine (CLARITIN) 10 MG tablet Take 10 mg by mouth daily. In prep for fulphila    . losartan (COZAAR) 50 MG tablet Take 50 mg by mouth daily  after breakfast.   2  . ondansetron (ZOFRAN) 8 MG tablet Take 1 tablet (8 mg total) by mouth 2 (two) times daily as needed for refractory nausea / vomiting. 30 tablet 1  . prochlorperazine (COMPAZINE) 10 MG tablet Take 1 tablet (10 mg total) by mouth every 6 (six) hours as needed (Nausea or vomiting). 30 tablet 1  . vitamin B-12 (CYANOCOBALAMIN) 1000 MCG tablet Take 1 tablet (1,000 mcg total) by mouth daily. 90 tablet 1   No current facility-administered medications for this visit.   Facility-Administered Medications Ordered in Other Visits  Medication Dose Route Frequency Provider Last Rate Last Admin  . sodium chloride flush (NS) 0.9 % injection 10 mL  10 mL Intravenous Once [name], MD         PHYSICAL EXAMINATION: ECOG PERFORMANCE STATUS: 1 - Symptomatic but completely ambulatory Vitals:   10/05/19 0859  BP: (!) 173/83  Pulse: 84  Resp: 16  Temp: (!) 96.4 F (35.8 C)  SpO2: 100%   Filed Weights   10/05/19 0859  Weight: 215 lb 8 oz (97.8 kg)    Physical Exam Constitutional:      General: He is not in acute distress. HENT:     Head: Normocephalic and atraumatic.  Eyes:     General: No scleral icterus.    Conjunctiva/sclera: Conjunctivae normal.     Pupils: Pupils are equal, round, and reactive to light.  Cardiovascular:     Rate and Rhythm: Normal rate and regular rhythm.     Heart sounds: Normal heart sounds.  Pulmonary:     Effort: Pulmonary effort is normal. No respiratory distress.     Breath sounds: No wheezing.  Abdominal:     General: Bowel sounds are normal. There is no distension.     Palpations: Abdomen is soft. There is no mass.     Tenderness: There is no abdominal tenderness.  Musculoskeletal:        General: No deformity. Normal range of motion.     Cervical back: Normal range of motion and neck supple.  Lymphadenopathy:     Cervical: No cervical adenopathy.  Skin:    General: Skin is warm and dry.     Findings: No erythema or rash.    Neurological:     Mental Status: He is alert and oriented to person, place, and time. Mental status is at baseline.     Cranial Nerves: No cranial nerve deficit.     Coordination: Coordination normal.  Psychiatric:  Mood and Affect: Mood normal.        Behavior: Behavior normal.        Thought Content: Thought content normal.      LABORATORY DATA:  I have reviewed the data as listed Lab Results  Component Value Date   WBC 6.4 10/05/2019   HGB 11.1 (L) 10/05/2019   HCT 34.0 (L) 10/05/2019   MCV 102.7 (H) 10/05/2019   PLT 298 10/05/2019   Recent Labs    09/07/19 0833 09/14/19 0924 10/05/19 0837  NA 136 138 136  K 4.2 4.3 3.9  CL 101 105 104  CO2 24 22 22   GLUCOSE 120* 142* 127*  BUN 34* 39* 31*  CREATININE 1.28* 1.39* 1.19  CALCIUM 9.8 9.3 9.0  GFRNONAA 58* 52* >60  GFRAA >60 >60 >60  PROT 7.4 7.4 7.2  ALBUMIN 4.2 4.2 4.0  AST 28 29 26   ALT 20 19 16   ALKPHOS 82 93 96  BILITOT 1.0 0.9 0.6   Iron/TIBC/Ferritin/ %Sat No results found for: IRON, TIBC, FERRITIN, IRONPCTSAT   RADIOGRAPHIC STUDIES: I have personally reviewed the radiological images as listed and agreed with the findings in the report. No results found.   ASSESSMENT & PLAN:  1. Prostate cancer metastatic to bone ([place])   2. Encounter for antineoplastic chemotherapy   3. Stage 3b chronic kidney disease   4. Macrocytic anemia   Cancer Staging Prostate cancer [facility]) Staging form: Prostate, AJCC 8th Edition - Clinical stage from 03/19/2018: Stage IVB (cTX, cN1, cM1b) - Signed by [name], MD on 05/01/2019  #Metastatic prostate Cancer, castration sensitive PSA 24.18--> 33.15-->52.75-->13.98--> 7.7--> 5.51-->5.58-->6.02 Labs reviewed and discussed with patient. PSA nadired at 5.51.  Today's PSA has increased to 6.02.  Counts acceptable to proceed with cycle 6 docetaxel today.  Patient will get growth factor support tomorrow. Mild diarrhea due to chemotherapy, self resolving.  Advised patient to  use Imodium as needed if diarrhea occurs after this chemo.  #Androgen deprivation therapy, patient will proceed with Lupron 22.5 mg today. #Hypertension, blood pressure has improved.  Recommend patient to continue monitor blood pressure and close follow-up with primary care provider.  Low-salt diet #Anemia, hemoglobin 11.1.  MCV 102, chronic.  Continue to monitor.  Likely secondary to chemotherapy.  #CKD, avoid nephrotoxins.  Creatinine is stable at 1.19 today. #Status post colonoscopy.  Colon polyp negative for high-grade dysplasia or malignancy.   [name], MD, PhD Hematology Oncology [facility] at [facility] Pager- [phone] 10/05/2019

## 2019-10-06 ENCOUNTER — Inpatient Hospital Stay: Payer: Medicare HMO

## 2019-10-06 DIAGNOSIS — Z5111 Encounter for antineoplastic chemotherapy: Secondary | ICD-10-CM | POA: Diagnosis not present

## 2019-10-06 DIAGNOSIS — C61 Malignant neoplasm of prostate: Secondary | ICD-10-CM

## 2019-10-06 MED ORDER — PEGFILGRASTIM-JMDB 6 MG/0.6ML ~~LOC~~ SOSY
6.0000 mg | PREFILLED_SYRINGE | Freq: Once | SUBCUTANEOUS | Status: AC
  Administered 2019-10-06: 14:00:00 6 mg via SUBCUTANEOUS
  Filled 2019-10-06: qty 0.6

## 2019-10-16 ENCOUNTER — Inpatient Hospital Stay (HOSPITAL_BASED_OUTPATIENT_CLINIC_OR_DEPARTMENT_OTHER): Payer: Medicare HMO | Admitting: Hospice and Palliative Medicine

## 2019-10-16 DIAGNOSIS — Z515 Encounter for palliative care: Secondary | ICD-10-CM

## 2019-10-16 NOTE — Progress Notes (Signed)
Spoke with patient briefly.  He said that he was not available to talk.  Will reschedule.

## 2019-11-02 ENCOUNTER — Encounter: Payer: Self-pay | Admitting: Oncology

## 2019-11-02 ENCOUNTER — Other Ambulatory Visit: Payer: Self-pay

## 2019-11-02 NOTE — Progress Notes (Signed)
Patient reports having the feeling of back "tightness" today.

## 2019-11-03 ENCOUNTER — Encounter: Payer: Self-pay | Admitting: Oncology

## 2019-11-03 ENCOUNTER — Inpatient Hospital Stay (HOSPITAL_BASED_OUTPATIENT_CLINIC_OR_DEPARTMENT_OTHER): Payer: Medicare HMO | Admitting: Oncology

## 2019-11-03 ENCOUNTER — Inpatient Hospital Stay: Payer: Medicare HMO | Attending: Oncology

## 2019-11-03 VITALS — BP 163/85 | HR 69 | Temp 96.8°F | Resp 18 | Wt 213.3 lb

## 2019-11-03 DIAGNOSIS — Z8546 Personal history of malignant neoplasm of prostate: Secondary | ICD-10-CM | POA: Insufficient documentation

## 2019-11-03 DIAGNOSIS — Z833 Family history of diabetes mellitus: Secondary | ICD-10-CM | POA: Insufficient documentation

## 2019-11-03 DIAGNOSIS — Z87891 Personal history of nicotine dependence: Secondary | ICD-10-CM | POA: Insufficient documentation

## 2019-11-03 DIAGNOSIS — Z880 Allergy status to penicillin: Secondary | ICD-10-CM | POA: Insufficient documentation

## 2019-11-03 DIAGNOSIS — C7951 Secondary malignant neoplasm of bone: Secondary | ICD-10-CM | POA: Diagnosis not present

## 2019-11-03 DIAGNOSIS — R5383 Other fatigue: Secondary | ICD-10-CM | POA: Insufficient documentation

## 2019-11-03 DIAGNOSIS — Z823 Family history of stroke: Secondary | ICD-10-CM | POA: Insufficient documentation

## 2019-11-03 DIAGNOSIS — I129 Hypertensive chronic kidney disease with stage 1 through stage 4 chronic kidney disease, or unspecified chronic kidney disease: Secondary | ICD-10-CM | POA: Diagnosis not present

## 2019-11-03 DIAGNOSIS — Z596 Low income: Secondary | ICD-10-CM | POA: Insufficient documentation

## 2019-11-03 DIAGNOSIS — Z79818 Long term (current) use of other agents affecting estrogen receptors and estrogen levels: Secondary | ICD-10-CM

## 2019-11-03 DIAGNOSIS — Z7952 Long term (current) use of systemic steroids: Secondary | ICD-10-CM | POA: Diagnosis not present

## 2019-11-03 DIAGNOSIS — D649 Anemia, unspecified: Secondary | ICD-10-CM | POA: Insufficient documentation

## 2019-11-03 DIAGNOSIS — N1832 Chronic kidney disease, stage 3b: Secondary | ICD-10-CM | POA: Diagnosis not present

## 2019-11-03 DIAGNOSIS — C61 Malignant neoplasm of prostate: Secondary | ICD-10-CM

## 2019-11-03 DIAGNOSIS — Z841 Family history of disorders of kidney and ureter: Secondary | ICD-10-CM | POA: Diagnosis not present

## 2019-11-03 DIAGNOSIS — Z79899 Other long term (current) drug therapy: Secondary | ICD-10-CM | POA: Diagnosis not present

## 2019-11-03 DIAGNOSIS — Z5111 Encounter for antineoplastic chemotherapy: Secondary | ICD-10-CM

## 2019-11-03 LAB — COMPREHENSIVE METABOLIC PANEL
ALT: 19 U/L (ref 0–44)
AST: 33 U/L (ref 15–41)
Albumin: 4 g/dL (ref 3.5–5.0)
Alkaline Phosphatase: 94 U/L (ref 38–126)
Anion gap: 14 (ref 5–15)
BUN: 27 mg/dL — ABNORMAL HIGH (ref 8–23)
CO2: 24 mmol/L (ref 22–32)
Calcium: 8.9 mg/dL (ref 8.9–10.3)
Chloride: 103 mmol/L (ref 98–111)
Creatinine, Ser: 1.38 mg/dL — ABNORMAL HIGH (ref 0.61–1.24)
GFR calc Af Amer: >60 mL/min (ref >60)
GFR calc non Af Amer: 53 mL/min — ABNORMAL LOW (ref >60)
Glucose, Bld: 93 mg/dL (ref 70–99)
Potassium: 3.6 mmol/L (ref 3.5–5.1)
Sodium: 141 mmol/L (ref 135–145)
Total Bilirubin: 0.9 mg/dL (ref 0.3–1.2)
Total Protein: 6.8 g/dL (ref 6.5–8.1)

## 2019-11-03 LAB — CBC WITH DIFFERENTIAL/PLATELET
Abs Immature Granulocytes: 0.02 10*3/uL (ref 0.00–0.07)
Basophils Absolute: 0.1 10*3/uL (ref 0.0–0.1)
Basophils Relative: 1 %
Eosinophils Absolute: 0.2 10*3/uL (ref 0.0–0.5)
Eosinophils Relative: 4 %
HCT: 35 % — ABNORMAL LOW (ref 39.0–52.0)
Hemoglobin: 11.4 g/dL — ABNORMAL LOW (ref 13.0–17.0)
Immature Granulocytes: 0 %
Lymphocytes Relative: 19 %
Lymphs Abs: 0.8 10*3/uL (ref 0.7–4.0)
MCH: 34.1 pg — ABNORMAL HIGH (ref 26.0–34.0)
MCHC: 32.6 g/dL (ref 30.0–36.0)
MCV: 104.8 fL — ABNORMAL HIGH (ref 80.0–100.0)
Monocytes Absolute: 0.5 10*3/uL (ref 0.1–1.0)
Monocytes Relative: 11 %
Neutro Abs: 2.9 10*3/uL (ref 1.7–7.7)
Neutrophils Relative %: 65 %
Platelets: 152 10*3/uL (ref 150–400)
RBC: 3.34 MIL/uL — ABNORMAL LOW (ref 4.22–5.81)
RDW: 14.7 % (ref 11.5–15.5)
WBC: 4.5 10*3/uL (ref 4.0–10.5)
nRBC: 0 % (ref 0.0–0.2)

## 2019-11-03 LAB — PSA: Prostatic Specific Antigen: 4.35 ng/mL — ABNORMAL HIGH (ref 0.00–4.00)

## 2019-11-03 NOTE — Progress Notes (Signed)
Hematology/Oncology Follow up note [facility] Telephone:(336) [phone] Fax:(336) [phone]   Patient Care Team: [name], MD as PCP - General (Family Medicine) [name], MD as Consulting Physician (Hematology and Oncology)  REFERRING PROVIDER: [facility] REASON FOR VISIT:  Reestablish care for prostate cancer.  HISTORY OF PRESENTING ILLNESS:  Terry Mitchell is a  69 y.o.  male with PMH listed below who was referred to me for evaluation of elevated PSA. Patient was referred by primary care physician to urology due to elevated PSA at the level of 72.  Per note Patient was referred to urology for further management.  Staging imaging has been ordered including abdominal and pelvis CT with contrast and bone scan.  Patient was also given Mills Koller for treatment of clinical prostate cancer.  Patient reports that he was scheduled to return to urologist office for biopsy. Patient denies any dysuria, hematuria, fever or chills.  He lives by himself. Denies any bone pain.  Reports some soreness at the area of [place] shots, as well as hot flushes.  Otherwise no complaints.  #01/01/2018 Patient received Firmagon loading dose at urologist office  # patient had a prostate biopsy on 02/28/2018 pathology showed prostate adenocarcinoma with androgen deprivation treatment effect.  Adenocarcinoma is present in all core fragments with involvement of 50% or more of each core.  Perineural invasion is present. As patient has received Firmagon prior to biopsy, Gleason grade cannot be reliably assessed after androgen deprivation therapy.  #  discussed with patient's urologist Dr. Bernardo Heater who did patient's prostate biopsy.  He agrees that given Mills Koller prior to biopsy and staging images are not typical.  He agrees that PET scan is consistent with lymph node involvement.  He does not think patient is a good candidate for radical prostatectomy.  He agrees with me about the plan  that definitive radiation is a good option.  # September 2019  external Beam radiation. He was scheduled to have I-125 interstitial implant although that can not been done so patient underwent external beam IMRT prostate boost, finished in January 2020.  # patient was on androgen deprivation therapy with Firmagon injections, until August 2020. Declined ADT due to vasomotor symptoms.   #Blood work from 04/14/2019 showed testosterone level of 284, PSA has been decreased to 24.18.  # 04/28/2019 CT chest abdomen pelvis and bone scan images were independently reviewed by me and discussed with patient. Images are consistent with metastatic prostate cancer.  # During the interval, also status post colonoscopy.  Descending colon polyp, resected.  Pathology showed tubular adenoma.  Negative for high-grade dysplasia or malignancy.  INTERVAL HISTORY Terry Mitchell is a 69 y.o. male who has above history reviewed by me today presents for follow-up visit for management of Stage IVA (cTX, cN1, cM1) castration sensitive metastatic prostate cancer He finished 6 cycles of Docetaxel treatments   Review of Systems  Constitutional: Positive for fatigue. Negative for appetite change, chills, fever and unexpected weight change.  HENT:   Negative for hearing loss and voice change.   Eyes: Negative for eye problems and icterus.  Respiratory: Negative for chest tightness, cough and shortness of breath.   Cardiovascular: Negative for chest pain and leg swelling.  Gastrointestinal: Negative for abdominal distention, abdominal pain and blood in stool.  Endocrine: Positive for hot flashes.  Genitourinary: Negative for difficulty urinating, dysuria and frequency.   Musculoskeletal: Negative for arthralgias.  Skin: Negative for itching and rash.  Neurological: Negative for extremity weakness,  light-headedness and numbness.  Hematological: Negative for adenopathy. Does not bruise/bleed easily.  Psychiatric/Behavioral:  Negative for confusion.     MEDICAL HISTORY:  Past Medical History:  Diagnosis Date  . Depression    recently went on disability d/t diagnosis  . History of recent fall 01/2018   missed the last step on ladder, causing back discomfort  . Hypertension   . Prostate cancer ([place]) 12/2017   prostate    SURGICAL HISTORY: Past Surgical History:  Procedure Laterality Date  . BACK SURGERY  2001    2 rods and 6 screws in back  . COLONOSCOPY WITH PROPOFOL N/A 09/24/2019   Procedure: COLONOSCOPY WITH PROPOFOL;  Surgeon: Jonathon Bellows, MD;  Location: [facility] ENDOSCOPY;  Service: Gastroenterology;  Laterality: N/A;  . Left shoulder surgery Left 1998   removed a piece of bone around collar bone  . PROSTATE BIOPSY N/A 02/28/2018   Procedure: PROSTATE BIOPSY;  Surgeon: Abbie Sons, MD;  Location: [facility] ORS;  Service: Urology;  Laterality: N/A;  . TRANSRECTAL ULTRASOUND N/A 02/28/2018   Procedure: TRANSRECTAL ULTRASOUND;  Surgeon: Abbie Sons, MD;  Location: [facility] ORS;  Service: Urology;  Laterality: N/A;    SOCIAL HISTORY: Social History   Socioeconomic History  . Marital status: Single    Spouse name: Not on file  . Number of children: 4  . Years of education: Not on file  . Highest education level: Not on file  Occupational History  . Occupation: disability  Tobacco Use  . Smoking status: Former Smoker    Packs/day: 2.00    Types: Cigars    Quit date: 07/24/2019    Years since quitting: 0.2  . Smokeless tobacco: Never Used  . Tobacco comment: 2 cigars  Substance and Sexual Activity  . Alcohol use: Yes    Alcohol/week: 9.0 standard drinks    Types: 9 Cans of beer per week    Comment: occasional  . Drug use: Yes    Types: Cocaine, "Crack" cocaine    Comment: none within the year  . Sexual activity: Not Currently  Other Topics Concern  . Not on file  Social History Narrative  . Not on file   Social Determinants of Health   Financial Resource Strain:   . Difficulty of  Paying Living Expenses:   Food Insecurity:   . Worried About Charity fundraiser in the Last Year:   . Arboriculturist in the Last Year:   Transportation Needs:   . Film/video editor (Medical):   Marland Kitchen Lack of Transportation (Non-Medical):   Physical Activity:   . Days of Exercise per Week:   . Minutes of Exercise per Session:   Stress:   . Feeling of Stress :   Social Connections:   . Frequency of Communication with Friends and Family:   . Frequency of Social Gatherings with Friends and Family:   . Attends Religious Services:   . Active Member of Clubs or Organizations:   . Attends Archivist Meetings:   Marland Kitchen Marital Status:   Intimate Partner Violence:   . Fear of Current or Ex-Partner:   . Emotionally Abused:   Marland Kitchen Physically Abused:   . Sexually Abused:     FAMILY HISTORY: Family History  Problem Relation Age of Onset  . Stroke Mother   . Kidney failure Mother   . Diabetes Maternal Aunt     ALLERGIES:  is allergic to penicillins and lactose intolerance (gi).  MEDICATIONS:  Current Outpatient  Medications  Medication Sig Dispense Refill  . albuterol (VENTOLIN HFA) 108 (90 Base) MCG/ACT inhaler Inhale 2 puffs into the lungs every 6 (six) hours as needed for wheezing or shortness of breath. 6.7 g 0  . amLODipine (NORVASC) 5 MG tablet Take 1 tablet (5 mg total) by mouth daily. 30 tablet 0  . loratadine (CLARITIN) 10 MG tablet Take 10 mg by mouth daily. In prep for fulphila    . losartan (COZAAR) 50 MG tablet Take 50 mg by mouth daily after breakfast.   2  . vitamin B-12 (CYANOCOBALAMIN) 1000 MCG tablet Take 1 tablet (1,000 mcg total) by mouth daily. 90 tablet 1  . dexamethasone (DECADRON) 4 MG tablet Take 2 tablets (8 mg total) by mouth 2 (two) times daily. Start the day before Taxotere. Then daily after chemo for 2 days. (Patient not taking: Reported on 11/02/2019) 30 tablet 1  . ondansetron (ZOFRAN) 8 MG tablet Take 1 tablet (8 mg total) by mouth 2 (two) times daily  as needed for refractory nausea / vomiting. (Patient not taking: Reported on 11/02/2019) 30 tablet 1  . prochlorperazine (COMPAZINE) 10 MG tablet Take 1 tablet (10 mg total) by mouth every 6 (six) hours as needed (Nausea or vomiting). (Patient not taking: Reported on 11/02/2019) 30 tablet 1   No current facility-administered medications for this visit.   Facility-Administered Medications Ordered in Other Visits  Medication Dose Route Frequency Provider Last Rate Last Admin  . sodium chloride flush (NS) 0.9 % injection 10 mL  10 mL Intravenous Once [name], MD         PHYSICAL EXAMINATION: ECOG PERFORMANCE STATUS: 1 - Symptomatic but completely ambulatory Vitals:   11/03/19 1018  BP: (!) 163/85  Pulse: 69  Resp: 18  Temp: (!) 96.8 F (36 C)   Filed Weights   11/03/19 1018  Weight: 213 lb 4.8 oz (96.8 kg)    Physical Exam Constitutional:      General: He is not in acute distress. HENT:     Head: Normocephalic and atraumatic.  Eyes:     General: No scleral icterus.    Conjunctiva/sclera: Conjunctivae normal.     Pupils: Pupils are equal, round, and reactive to light.  Cardiovascular:     Rate and Rhythm: Normal rate and regular rhythm.     Heart sounds: Normal heart sounds.  Pulmonary:     Effort: Pulmonary effort is normal. No respiratory distress.     Breath sounds: No wheezing.  Abdominal:     General: Bowel sounds are normal. There is no distension.     Palpations: Abdomen is soft. There is no mass.     Tenderness: There is no abdominal tenderness.  Musculoskeletal:        General: No deformity. Normal range of motion.     Cervical back: Normal range of motion and neck supple.  Lymphadenopathy:     Cervical: No cervical adenopathy.  Skin:    General: Skin is warm and dry.     Findings: No erythema or rash.  Neurological:     General: No focal deficit present.     Mental Status: He is alert. Mental status is at baseline.     Cranial Nerves: No cranial nerve  deficit.     Coordination: Coordination normal.  Psychiatric:        Mood and Affect: Mood normal.        Behavior: Behavior normal.        Thought Content: Thought content  normal.      LABORATORY DATA:  I have reviewed the data as listed Lab Results  Component Value Date   WBC 4.5 11/03/2019   HGB 11.4 (L) 11/03/2019   HCT 35.0 (L) 11/03/2019   MCV 104.8 (H) 11/03/2019   PLT 152 11/03/2019   Recent Labs    09/14/19 0924 10/05/19 0837 11/03/19 1002  NA 138 136 141  K 4.3 3.9 3.6  CL 105 104 103  CO2 22 22 24   GLUCOSE 142* 127* 93  BUN 39* 31* 27*  CREATININE 1.39* 1.19 1.38*  CALCIUM 9.3 9.0 8.9  GFRNONAA 52* >60 53*  GFRAA >60 >60 >60  PROT 7.4 7.2 6.8  ALBUMIN 4.2 4.0 4.0  AST 29 26 33  ALT 19 16 19   ALKPHOS 93 96 94  BILITOT 0.9 0.6 0.9   Iron/TIBC/Ferritin/ %Sat No results found for: IRON, TIBC, FERRITIN, IRONPCTSAT   RADIOGRAPHIC STUDIES: I have personally reviewed the radiological images as listed and agreed with the findings in the report. No results found.   ASSESSMENT & PLAN:  1. Prostate cancer ([place])   2. Prostate cancer metastatic to bone ([place])   3. Stage 3b chronic kidney disease   Cancer Staging Prostate cancer [facility]) Staging form: Prostate, AJCC 8th Edition - Clinical stage from 03/19/2018: Stage IVB (cTX, cN1, cM1b) - Signed by [name], MD on 05/01/2019  #Metastatic prostate Cancer, castration sensitive PSA 24.18--> 33.15-->52.75-->13.98--> 7.7--> 5.51-->5.58-->6.02-->4.35 Labs reviewed and discussed with patient. PSA continues to decreased.   # ADT, due in 2 months. Lupron 22.5mg Q3 months.  # Hypertension, follow up with PCP.  # Anemia, hemoglobin has improved to 11.4.  Chronic macrocytosis etiology unknown. Adequate B12 and folate.  #CKD, avoid nephrotoxins. Creatinine 1.38.  Follow up in 2 months.   [name], MD, PhD Hematology Oncology [facility] at [facility] Pager- [phone] 11/03/2019

## 2019-11-04 LAB — TESTOSTERONE: Testosterone: <3 ng/dL — ABNORMAL LOW (ref 264–916)

## 2019-11-17 ENCOUNTER — Inpatient Hospital Stay (HOSPITAL_BASED_OUTPATIENT_CLINIC_OR_DEPARTMENT_OTHER): Payer: Medicare HMO | Admitting: Hospice and Palliative Medicine

## 2019-11-17 DIAGNOSIS — Z515 Encounter for palliative care: Secondary | ICD-10-CM

## 2019-11-17 NOTE — Progress Notes (Signed)
I did not reach patient by phone.  Voicemail was left.  Will reschedule.

## 2019-11-30 ENCOUNTER — Other Ambulatory Visit: Payer: Medicare HMO

## 2019-11-30 ENCOUNTER — Ambulatory Visit: Payer: Medicare HMO | Admitting: Oncology

## 2020-01-05 ENCOUNTER — Inpatient Hospital Stay: Payer: Medicare HMO

## 2020-01-05 ENCOUNTER — Other Ambulatory Visit: Payer: Self-pay

## 2020-01-05 ENCOUNTER — Inpatient Hospital Stay: Payer: Medicare HMO | Attending: Oncology | Admitting: Oncology

## 2020-01-05 ENCOUNTER — Encounter: Payer: Self-pay | Admitting: Oncology

## 2020-01-05 ENCOUNTER — Telehealth: Payer: Self-pay | Admitting: *Deleted

## 2020-01-05 ENCOUNTER — Inpatient Hospital Stay (HOSPITAL_BASED_OUTPATIENT_CLINIC_OR_DEPARTMENT_OTHER): Payer: Medicare HMO | Admitting: Hospice and Palliative Medicine

## 2020-01-05 VITALS — BP 165/89 | HR 60 | Temp 97.8°F | Resp 18 | Wt 205.3 lb

## 2020-01-05 DIAGNOSIS — R972 Elevated prostate specific antigen [PSA]: Secondary | ICD-10-CM

## 2020-01-05 DIAGNOSIS — R232 Flushing: Secondary | ICD-10-CM | POA: Diagnosis not present

## 2020-01-05 DIAGNOSIS — Z5111 Encounter for antineoplastic chemotherapy: Secondary | ICD-10-CM | POA: Diagnosis not present

## 2020-01-05 DIAGNOSIS — C61 Malignant neoplasm of prostate: Secondary | ICD-10-CM

## 2020-01-05 DIAGNOSIS — N179 Acute kidney failure, unspecified: Secondary | ICD-10-CM | POA: Diagnosis not present

## 2020-01-05 DIAGNOSIS — Z79818 Long term (current) use of other agents affecting estrogen receptors and estrogen levels: Secondary | ICD-10-CM

## 2020-01-05 DIAGNOSIS — D696 Thrombocytopenia, unspecified: Secondary | ICD-10-CM | POA: Diagnosis not present

## 2020-01-05 DIAGNOSIS — Z923 Personal history of irradiation: Secondary | ICD-10-CM | POA: Insufficient documentation

## 2020-01-05 DIAGNOSIS — Z87891 Personal history of nicotine dependence: Secondary | ICD-10-CM | POA: Diagnosis not present

## 2020-01-05 DIAGNOSIS — Z833 Family history of diabetes mellitus: Secondary | ICD-10-CM | POA: Diagnosis not present

## 2020-01-05 DIAGNOSIS — R5383 Other fatigue: Secondary | ICD-10-CM | POA: Diagnosis not present

## 2020-01-05 DIAGNOSIS — F1490 Cocaine use, unspecified, uncomplicated: Secondary | ICD-10-CM | POA: Insufficient documentation

## 2020-01-05 DIAGNOSIS — Z823 Family history of stroke: Secondary | ICD-10-CM | POA: Diagnosis not present

## 2020-01-05 DIAGNOSIS — D539 Nutritional anemia, unspecified: Secondary | ICD-10-CM | POA: Insufficient documentation

## 2020-01-05 DIAGNOSIS — Z880 Allergy status to penicillin: Secondary | ICD-10-CM | POA: Insufficient documentation

## 2020-01-05 DIAGNOSIS — D7589 Other specified diseases of blood and blood-forming organs: Secondary | ICD-10-CM | POA: Diagnosis not present

## 2020-01-05 DIAGNOSIS — Z515 Encounter for palliative care: Secondary | ICD-10-CM | POA: Diagnosis not present

## 2020-01-05 DIAGNOSIS — Z841 Family history of disorders of kidney and ureter: Secondary | ICD-10-CM | POA: Insufficient documentation

## 2020-01-05 DIAGNOSIS — N1832 Chronic kidney disease, stage 3b: Secondary | ICD-10-CM | POA: Diagnosis not present

## 2020-01-05 DIAGNOSIS — C7951 Secondary malignant neoplasm of bone: Secondary | ICD-10-CM | POA: Insufficient documentation

## 2020-01-05 DIAGNOSIS — Z7289 Other problems related to lifestyle: Secondary | ICD-10-CM | POA: Insufficient documentation

## 2020-01-05 DIAGNOSIS — Z79899 Other long term (current) drug therapy: Secondary | ICD-10-CM | POA: Insufficient documentation

## 2020-01-05 LAB — COMPREHENSIVE METABOLIC PANEL
ALT: 21 U/L (ref 0–44)
AST: 41 U/L (ref 15–41)
Albumin: 4.4 g/dL (ref 3.5–5.0)
Alkaline Phosphatase: 84 U/L (ref 38–126)
Anion gap: 11 (ref 5–15)
BUN: 35 mg/dL — ABNORMAL HIGH (ref 8–23)
CO2: 23 mmol/L (ref 22–32)
Calcium: 9.6 mg/dL (ref 8.9–10.3)
Chloride: 108 mmol/L (ref 98–111)
Creatinine, Ser: 1.96 mg/dL — ABNORMAL HIGH (ref 0.61–1.24)
GFR calc Af Amer: 40 mL/min — ABNORMAL LOW (ref >60)
GFR calc non Af Amer: 34 mL/min — ABNORMAL LOW (ref >60)
Glucose, Bld: 100 mg/dL — ABNORMAL HIGH (ref 70–99)
Potassium: 3.8 mmol/L (ref 3.5–5.1)
Sodium: 142 mmol/L (ref 135–145)
Total Bilirubin: 0.9 mg/dL (ref 0.3–1.2)
Total Protein: 7.3 g/dL (ref 6.5–8.1)

## 2020-01-05 LAB — CBC WITH DIFFERENTIAL/PLATELET
Abs Immature Granulocytes: 0.02 10*3/uL (ref 0.00–0.07)
Basophils Absolute: 0 10*3/uL (ref 0.0–0.1)
Basophils Relative: 0 %
Eosinophils Absolute: 0.1 10*3/uL (ref 0.0–0.5)
Eosinophils Relative: 2 %
HCT: 35 % — ABNORMAL LOW (ref 39.0–52.0)
Hemoglobin: 11.7 g/dL — ABNORMAL LOW (ref 13.0–17.0)
Immature Granulocytes: 0 %
Lymphocytes Relative: 17 %
Lymphs Abs: 0.9 10*3/uL (ref 0.7–4.0)
MCH: 33.7 pg (ref 26.0–34.0)
MCHC: 33.4 g/dL (ref 30.0–36.0)
MCV: 100.9 fL — ABNORMAL HIGH (ref 80.0–100.0)
Monocytes Absolute: 0.5 10*3/uL (ref 0.1–1.0)
Monocytes Relative: 9 %
Neutro Abs: 3.6 10*3/uL (ref 1.7–7.7)
Neutrophils Relative %: 72 %
Platelets: 136 10*3/uL — ABNORMAL LOW (ref 150–400)
RBC: 3.47 MIL/uL — ABNORMAL LOW (ref 4.22–5.81)
RDW: 12.5 % (ref 11.5–15.5)
WBC: 5.1 10*3/uL (ref 4.0–10.5)
nRBC: 0 % (ref 0.0–0.2)

## 2020-01-05 LAB — PSA: Prostatic Specific Antigen: 5.58 ng/mL — ABNORMAL HIGH (ref 0.00–4.00)

## 2020-01-05 MED ORDER — LEUPROLIDE ACETATE (3 MONTH) 22.5 MG ~~LOC~~ KIT
22.5000 mg | PACK | Freq: Once | SUBCUTANEOUS | Status: AC
  Administered 2020-01-05: 22.5 mg via SUBCUTANEOUS
  Filled 2020-01-05: qty 22.5

## 2020-01-05 NOTE — Progress Notes (Signed)
Hematology/Oncology Follow up note [facility] Telephone:(336) [phone] Fax:(336) [phone]   Patient Care Team: [name], MD as PCP - General (Family Medicine) [name], MD as Consulting Physician (Hematology and Oncology)  REASON FOR VISIT:  Follow up for prostate cancer.  HISTORY OF PRESENTING ILLNESS:  Terry Mitchell is a  69 y.o.  male with PMH listed below who was referred to me for evaluation of elevated PSA. Patient was referred by primary care physician to urology due to elevated PSA at the level of 72.  Per note Patient was referred to urology for further management.  Staging imaging has been ordered including abdominal and pelvis CT with contrast and bone scan.  Patient was also given Mills Koller for treatment of clinical prostate cancer.  Patient reports that he was scheduled to return to urologist office for biopsy. Patient denies any dysuria, hematuria, fever or chills.  He lives by himself. Denies any bone pain.  Reports some soreness at the area of [place] shots, as well as hot flushes.  Otherwise no complaints.  #01/01/2018 Patient received Firmagon loading dose at urologist office  # patient had a prostate biopsy on 02/28/2018 pathology showed prostate adenocarcinoma with androgen deprivation treatment effect.  Adenocarcinoma is present in all core fragments with involvement of 50% or more of each core.  Perineural invasion is present. As patient has received Firmagon prior to biopsy, Gleason grade cannot be reliably assessed after androgen deprivation therapy.  #  discussed with patient's urologist Dr. Bernardo Heater who did patient's prostate biopsy.  He agrees that given Mills Koller prior to biopsy and staging images are not typical.  He agrees that PET scan is consistent with lymph node involvement.  He does not think patient is a good candidate for radical prostatectomy.  He agrees with me about the plan that definitive radiation is a good option.  #  September 2019  external Beam radiation. He was scheduled to have I-125 interstitial implant although that can not been done so patient underwent external beam IMRT prostate boost, finished in January 2020.  # patient was on androgen deprivation therapy with Firmagon injections, until August 2020. Declined ADT due to vasomotor symptoms.   #Blood work from 04/14/2019 showed testosterone level of 284, PSA has been decreased to 24.18.  # 04/28/2019 CT chest abdomen pelvis and bone scan images were independently reviewed by me and discussed with patient. Images are consistent with metastatic prostate cancer.  # 09/24/2019 colonoscopy.  Descending colon polyp, resected.  Pathology showed tubular adenoma.  Negative for high-grade dysplasia or malignancy. # # 09/25/2019 Endoscopy  # 1023/2021- 10/05/2019  6 cycles of Docetaxel treatments INTERVAL HISTORY Terry Mitchell is a 69 y.o. male who has above history reviewed by me today presents for follow-up visit for management of Stage IVA (cTX, cN1, cM1) castration sensitive metastatic prostate cancer He continues to have hot flash and sweating.  Otherwise he feels well.  No nausea, vomiting, diarrhea   Review of Systems  Constitutional: Positive for fatigue. Negative for appetite change, chills, fever and unexpected weight change.  HENT:   Negative for hearing loss and voice change.   Eyes: Negative for eye problems and icterus.  Respiratory: Negative for chest tightness, cough and shortness of breath.   Cardiovascular: Negative for chest pain and leg swelling.  Gastrointestinal: Negative for abdominal distention, abdominal pain and blood in stool.  Endocrine: Positive for hot flashes.  Genitourinary: Negative for difficulty urinating, dysuria and frequency.   Musculoskeletal: Negative for  arthralgias.  Skin: Negative for itching and rash.  Neurological: Negative for extremity weakness, light-headedness and numbness.  Hematological: Negative for  adenopathy. Does not bruise/bleed easily.  Psychiatric/Behavioral: Negative for confusion.     MEDICAL HISTORY:  Past Medical History:  Diagnosis Date  . Depression    recently went on disability d/t diagnosis  . History of recent fall 01/2018   missed the last step on ladder, causing back discomfort  . Hypertension   . Prostate cancer ([place]) 12/2017   prostate    SURGICAL HISTORY: Past Surgical History:  Procedure Laterality Date  . BACK SURGERY  2001    2 rods and 6 screws in back  . COLONOSCOPY WITH PROPOFOL N/A 09/24/2019   Procedure: COLONOSCOPY WITH PROPOFOL;  Surgeon: Jonathon Bellows, MD;  Location: [facility] ENDOSCOPY;  Service: Gastroenterology;  Laterality: N/A;  . Left shoulder surgery Left 1998   removed a piece of bone around collar bone  . PROSTATE BIOPSY N/A 02/28/2018   Procedure: PROSTATE BIOPSY;  Surgeon: Abbie Sons, MD;  Location: [facility] ORS;  Service: Urology;  Laterality: N/A;  . TRANSRECTAL ULTRASOUND N/A 02/28/2018   Procedure: TRANSRECTAL ULTRASOUND;  Surgeon: Abbie Sons, MD;  Location: [facility] ORS;  Service: Urology;  Laterality: N/A;    SOCIAL HISTORY: Social History   Socioeconomic History  . Marital status: Single    Spouse name: Not on file  . Number of children: 4  . Years of education: Not on file  . Highest education level: Not on file  Occupational History  . Occupation: disability  Tobacco Use  . Smoking status: Former Smoker    Packs/day: 2.00    Types: Cigars    Quit date: 07/24/2019    Years since quitting: 0.4  . Smokeless tobacco: Never Used  . Tobacco comment: 2 cigars  Vaping Use  . Vaping Use: Never used  Substance and Sexual Activity  . Alcohol use: Yes    Alcohol/week: 9.0 standard drinks    Types: 9 Cans of beer per week    Comment: occasional  . Drug use: Yes    Types: Cocaine, "Crack" cocaine    Comment: none within the year  . Sexual activity: Not Currently  Other Topics Concern  . Not on file  Social History  Narrative  . Not on file   Social Determinants of Health   Financial Resource Strain:   . Difficulty of Paying Living Expenses:   Food Insecurity:   . Worried About Charity fundraiser in the Last Year:   . Arboriculturist in the Last Year:   Transportation Needs:   . Film/video editor (Medical):   Marland Kitchen Lack of Transportation (Non-Medical):   Physical Activity:   . Days of Exercise per Week:   . Minutes of Exercise per Session:   Stress:   . Feeling of Stress :   Social Connections:   . Frequency of Communication with Friends and Family:   . Frequency of Social Gatherings with Friends and Family:   . Attends Religious Services:   . Active Member of Clubs or Organizations:   . Attends Archivist Meetings:   Marland Kitchen Marital Status:   Intimate Partner Violence:   . Fear of Current or Ex-Partner:   . Emotionally Abused:   Marland Kitchen Physically Abused:   . Sexually Abused:     FAMILY HISTORY: Family History  Problem Relation Age of Onset  . Stroke Mother   . Kidney failure Mother   .  Diabetes Maternal Aunt     ALLERGIES:  is allergic to penicillins and lactose intolerance (gi).  MEDICATIONS:  Current Outpatient Medications  Medication Sig Dispense Refill  . albuterol (VENTOLIN HFA) 108 (90 Base) MCG/ACT inhaler Inhale 2 puffs into the lungs every 6 (six) hours as needed for wheezing or shortness of breath. 6.7 g 0  . amLODipine (NORVASC) 5 MG tablet Take 1 tablet (5 mg total) by mouth daily. 30 tablet 0  . loratadine (CLARITIN) 10 MG tablet Take 10 mg by mouth daily. In prep for fulphila    . losartan (COZAAR) 50 MG tablet Take 50 mg by mouth daily after breakfast.   2  . vitamin B-12 (CYANOCOBALAMIN) 1000 MCG tablet Take 1 tablet (1,000 mcg total) by mouth daily. 90 tablet 1  . ondansetron (ZOFRAN) 8 MG tablet Take 1 tablet (8 mg total) by mouth 2 (two) times daily as needed for refractory nausea / vomiting. (Patient not taking: Reported on 11/02/2019) 30 tablet 1  .  prochlorperazine (COMPAZINE) 10 MG tablet Take 1 tablet (10 mg total) by mouth every 6 (six) hours as needed (Nausea or vomiting). (Patient not taking: Reported on 11/02/2019) 30 tablet 1   No current facility-administered medications for this visit.   Facility-Administered Medications Ordered in Other Visits  Medication Dose Route Frequency Provider Last Rate Last Admin  . sodium chloride flush (NS) 0.9 % injection 10 mL  10 mL Intravenous Once [name], MD         PHYSICAL EXAMINATION: ECOG PERFORMANCE STATUS: 1 - Symptomatic but completely ambulatory Vitals:   01/05/20 1400 01/05/20 1401  BP: (!) 166/88 (!) 165/89  Pulse: 60 60  Resp: 18   Temp: 97.8 F (36.6 C)    Filed Weights   01/05/20 1400  Weight: 205 lb 4.8 oz (93.1 kg)    Physical Exam Constitutional:      General: He is not in acute distress. HENT:     Head: Normocephalic and atraumatic.  Eyes:     General: No scleral icterus.    Conjunctiva/sclera: Conjunctivae normal.     Pupils: Pupils are equal, round, and reactive to light.  Cardiovascular:     Rate and Rhythm: Normal rate and regular rhythm.     Heart sounds: Normal heart sounds.  Pulmonary:     Effort: Pulmonary effort is normal. No respiratory distress.     Breath sounds: No wheezing.  Abdominal:     General: Bowel sounds are normal. There is no distension.     Palpations: Abdomen is soft. There is no mass.     Tenderness: There is no abdominal tenderness.  Musculoskeletal:        General: No deformity. Normal range of motion.     Cervical back: Normal range of motion and neck supple.  Lymphadenopathy:     Cervical: No cervical adenopathy.  Skin:    General: Skin is warm and dry.     Findings: No erythema or rash.  Neurological:     General: No focal deficit present.     Mental Status: He is alert and oriented to person, place, and time. Mental status is at baseline.     Cranial Nerves: No cranial nerve deficit.     Coordination: Coordination  normal.  Psychiatric:        Mood and Affect: Mood normal.      LABORATORY DATA:  I have reviewed the data as listed Lab Results  Component Value Date   WBC 5.1 01/05/2020  HGB 11.7 (L) 01/05/2020   HCT 35.0 (L) 01/05/2020   MCV 100.9 (H) 01/05/2020   PLT 136 (L) 01/05/2020   Recent Labs    10/05/19 0837 11/03/19 1002 01/05/20 1252  NA 136 141 142  K 3.9 3.6 3.8  CL 104 103 108  CO2 22 24 23   GLUCOSE 127* 93 100*  BUN 31* 27* 35*  CREATININE 1.19 1.38* 1.96*  CALCIUM 9.0 8.9 9.6  GFRNONAA >60 53* 34*  GFRAA >60 >60 40*  PROT 7.2 6.8 7.3  ALBUMIN 4.0 4.0 4.4  AST 26 33 41  ALT 16 19 21   ALKPHOS 96 94 84  BILITOT 0.6 0.9 0.9   Iron/TIBC/Ferritin/ %Sat No results found for: IRON, TIBC, FERRITIN, IRONPCTSAT   RADIOGRAPHIC STUDIES: I have personally reviewed the radiological images as listed and agreed with the findings in the report. No results found.   ASSESSMENT & PLAN:  1. Prostate cancer ([place])   2. Stage 3b chronic kidney disease   3. Prostate cancer metastatic to bone ([place])   4. Androgen deprivation therapy   5. Macrocytic anemia   Cancer Staging Prostate cancer [facility]) Staging form: Prostate, AJCC 8th Edition - Clinical stage from 03/19/2018: Stage IVB (cTX, cN1, cM1b) - Signed by [name], MD on 05/01/2019  #Metastatic prostate Cancer, castration sensitive PSA 24.18--> 33.15-->52.75-->13.98--> 7.7--> 5.51-->5.58-->6.02-->4.35-->5.58 PSA has trended up.  Continue ADT. leupron 22.5mg  Q3 month  # Acute on chronic kidney failure.  Creatinine further trend up to 1.96.  Obtain US renal Recommend him to repeat BMP next week and he declines. Also declines IV hydration.   # Anemia, hemoglobin is 11.7.  Chronic macrocytosis.   # thrombocytopenia platelet count is 136000,   Follow up in 3 months.    [name], MD, PhD Hematology Oncology [facility] at [facility] Pager- [phone] 01/05/2020

## 2020-01-05 NOTE — Progress Notes (Signed)
Pt here for follow up. Pt continues to report hot flashes and sweating.

## 2020-01-05 NOTE — Telephone Encounter (Signed)
Pt Terry Mitchell has been scheduled per 01/05/20 los. I called pt and made him aware of his scheduled Terry Mitchell scheduled on 01/06/20  2:30 appt @ the [facility] location, date, time and that he must have a full bladder.

## 2020-01-05 NOTE — Progress Notes (Signed)
[place]  [phone] [phone]   Name: Terry Mitchell Date: 01/05/2020 MRN: [id number]  DOB: 12/12/1950  Patient Care Team: [name], MD as PCP - General (Family Medicine) [name], MD as Consulting Physician (Hematology and Oncology)    REASON FOR CONSULTATION: Terry Mitchell is a 69 y.o. male with multiple medical problems including stage IV prostate cancer metastatic to bone (diagnosed August 2019) status post XRT and chemotherapy on Lupron.  He was referred to palliative care to help address goals and manage ongoing symptoms..   SOCIAL HISTORY:     reports that he quit smoking about 5 months ago. His smoking use included cigars. He smoked 2.00 packs per day. He has never used smokeless tobacco. He reports current alcohol use of about 9.0 standard drinks of alcohol per week. He reports current drug use. Drugs: Cocaine and "Crack" cocaine.   Patient lives in a boardinghouse.  He does not drive and transports himself with a scooter.  He has a girlfriend who is available to help with his care as needed.  He has 4 adult children who live around the state of [place].  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  ADVANCE DIRECTIVES:  Does not have  CODE STATUS:  PAST MEDICAL HISTORY: Past Medical History:  Diagnosis Date  . Depression    recently went on disability d/t diagnosis  . History of recent fall 01/2018   missed the last step on ladder, causing back discomfort  . Hypertension   . Prostate cancer ([place]) 12/2017   prostate    PAST SURGICAL HISTORY:  Past Surgical History:  Procedure Laterality Date  . BACK SURGERY  2001    2 rods and 6 screws in back  . COLONOSCOPY WITH PROPOFOL N/A 09/24/2019   Procedure: COLONOSCOPY WITH PROPOFOL;  Surgeon: Jonathon Bellows, MD;  Location: [facility] ENDOSCOPY;  Service: Gastroenterology;  Laterality: N/A;  . Left shoulder surgery  Left 1998   removed a piece of bone around collar bone  . PROSTATE BIOPSY N/A 02/28/2018   Procedure: PROSTATE BIOPSY;  Surgeon: Abbie Sons, MD;  Location: [facility] ORS;  Service: Urology;  Laterality: N/A;  . TRANSRECTAL ULTRASOUND N/A 02/28/2018   Procedure: TRANSRECTAL ULTRASOUND;  Surgeon: Abbie Sons, MD;  Location: [facility] ORS;  Service: Urology;  Laterality: N/A;    HEMATOLOGY/ONCOLOGY HISTORY:  Oncology History  Prostate cancer ([place])  03/19/2018 Initial Diagnosis   Prostate cancer ([place])   03/19/2018 Cancer Staging   Staging form: Prostate, AJCC 8th Edition - Clinical stage from 03/19/2018: Stage IVB (cTX, cN1, cM1b) - Signed by [name], MD on 05/01/2019   05/15/2019 -  Chemotherapy   The patient had pegfilgrastim-jmdb (FULPHILA) injection 6 mg, 6 mg, Subcutaneous,  Once, 6 of 6 cycles Administration: 6 mg (05/18/2019), 6 mg (07/06/2019), 6 mg (07/28/2019), 6 mg (09/15/2019), 6 mg (10/06/2019), 6 mg (08/18/2019) DOCEtaxel (TAXOTERE) 160 mg in sodium chloride 0.9 % 250 mL chemo infusion, 75 mg/m2 = 160 mg, Intravenous,  Once, 6 of 6 cycles Administration: 160 mg (05/15/2019), 160 mg (07/03/2019), 160 mg (07/27/2019), 160 mg (09/14/2019), 160 mg (10/05/2019), 160 mg (08/17/2019)  for chemotherapy treatment.      ALLERGIES:  is allergic to penicillins and lactose intolerance (gi).  MEDICATIONS:  Current Outpatient Medications  Medication Sig Dispense Refill  . albuterol (VENTOLIN HFA) 108 (90 Base) MCG/ACT inhaler Inhale 2 puffs into the lungs every 6 (six) hours  as needed for wheezing or shortness of breath. 6.7 g 0  . amLODipine (NORVASC) 5 MG tablet Take 1 tablet (5 mg total) by mouth daily. 30 tablet 0  . loratadine (CLARITIN) 10 MG tablet Take 10 mg by mouth daily. In prep for fulphila    . losartan (COZAAR) 50 MG tablet Take 50 mg by mouth daily after breakfast.   2  . ondansetron (ZOFRAN) 8 MG tablet Take 1 tablet (8 mg total) by mouth 2 (two) times daily as needed for refractory  nausea / vomiting. (Patient not taking: Reported on 11/02/2019) 30 tablet 1  . prochlorperazine (COMPAZINE) 10 MG tablet Take 1 tablet (10 mg total) by mouth every 6 (six) hours as needed (Nausea or vomiting). (Patient not taking: Reported on 11/02/2019) 30 tablet 1  . vitamin B-12 (CYANOCOBALAMIN) 1000 MCG tablet Take 1 tablet (1,000 mcg total) by mouth daily. 90 tablet 1   No current facility-administered medications for this visit.   Facility-Administered Medications Ordered in Other Visits  Medication Dose Route Frequency Provider Last Rate Last Admin  . Leuprolide Acetate (3 Month) (ELIGARD) 22.5 MG injection 22.5 mg  22.5 mg Subcutaneous Once [name], MD      . sodium chloride flush (NS) 0.9 % injection 10 mL  10 mL Intravenous Once [name], MD        VITAL SIGNS: There were no vitals taken for this visit. There were no vitals filed for this visit.  Estimated body mass index is 27.84 kg/m as calculated from the following:   Height as of 09/24/19: 6' (1.829 m).   Weight as of an earlier encounter on 01/05/20: 205 lb 4.8 oz (93.1 kg).  LABS: CBC:    Component Value Date/Time   WBC 5.1 01/05/2020 1252   HGB 11.7 (L) 01/05/2020 1252   HCT 35.0 (L) 01/05/2020 1252   PLT 136 (L) 01/05/2020 1252   MCV 100.9 (H) 01/05/2020 1252   NEUTROABS 3.6 01/05/2020 1252   LYMPHSABS 0.9 01/05/2020 1252   MONOABS 0.5 01/05/2020 1252   EOSABS 0.1 01/05/2020 1252   BASOSABS 0.0 01/05/2020 1252   Comprehensive Metabolic Panel:    Component Value Date/Time   NA 142 01/05/2020 1252   K 3.8 01/05/2020 1252   CL 108 01/05/2020 1252   CO2 23 01/05/2020 1252   BUN 35 (H) 01/05/2020 1252   BUN 19 01/01/2018 1509   CREATININE 1.96 (H) 01/05/2020 1252   GLUCOSE 100 (H) 01/05/2020 1252   CALCIUM 9.6 01/05/2020 1252   AST 41 01/05/2020 1252   ALT 21 01/05/2020 1252   ALKPHOS 84 01/05/2020 1252   BILITOT 0.9 01/05/2020 1252   PROT 7.3 01/05/2020 1252   ALBUMIN 4.4 01/05/2020 1252     RADIOGRAPHIC STUDIES: No results found.  PERFORMANCE STATUS (ECOG) : 1 - Symptomatic but completely ambulatory  Review of Systems Unless otherwise noted, a complete review of systems is negative.  Physical Exam General: NAD Pulmonary: unlabored Extremities: no edema, no joint deformities Skin: no rashes Neurological: Weakness but otherwise nonfocal  IMPRESSION: Routine follow-up visit.  Patient reports he is doing well.  He denies any significant changes or concerns.  No symptomatic complaints today.  He continues to have chronic sweating from hormonal treatments.  Patient says that he is now staying with his girlfriend as she is on an LVAD and is having to help with her medical care.  Patient describes good performance status.  He says his appetite is "too good."  PLAN: -Continue current  scope of treatment -Follow-up virtual visit in 3 months   Patient expressed understanding and was in agreement with this plan. He also understands that He can call the clinic at any time with any questions, concerns, or complaints.     Time Total: 15 minutes  Visit consisted of counseling and education dealing with the complex and emotionally intense issues of symptom management and palliative care in the setting of serious and potentially life-threatening illness.Greater than 50%  of this time was spent counseling and coordinating care related to the above assessment and plan.  Signed by: Altha Harm, PhD, NP-C

## 2020-01-06 ENCOUNTER — Ambulatory Visit
Admission: RE | Admit: 2020-01-06 | Discharge: 2020-01-06 | Disposition: A | Discharge status: Home / Self Care | Payer: Medicare HMO | Source: Ambulatory Visit | Attending: Oncology | Admitting: Oncology

## 2020-01-06 DIAGNOSIS — N1832 Chronic kidney disease, stage 3b: Secondary | ICD-10-CM | POA: Insufficient documentation

## 2020-01-06 IMAGING — US US RENAL
1 series · 15 of 25 positions shown · non-contrast
Comparison: [DATE]

CLINICAL DATA: Acute on chronic renal phase

EXAM:
RENAL / URINARY TRACT ULTRASOUND COMPLETE

[Series 1: us renal · 15 of 42 slices shown]
[im 1/42]
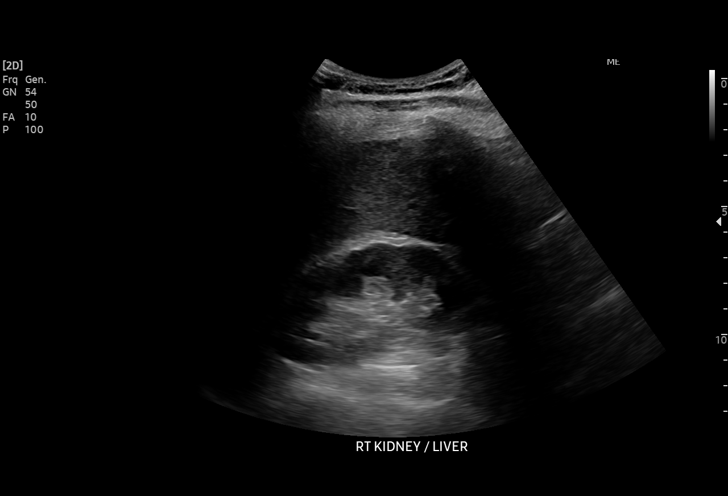
[im 4/42]
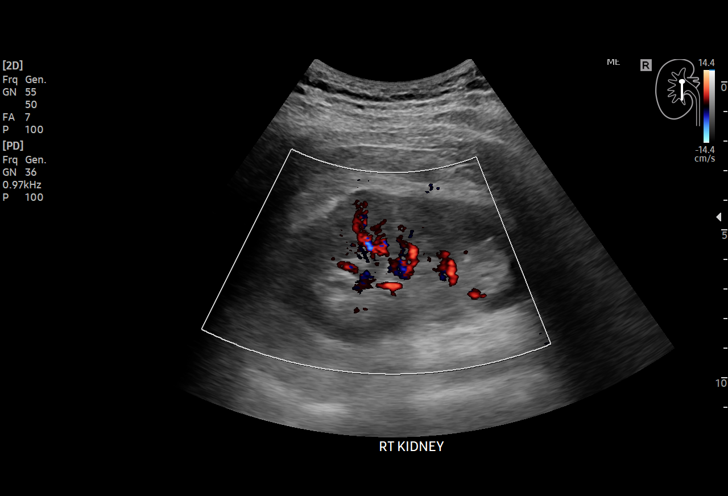
[im 7/42]
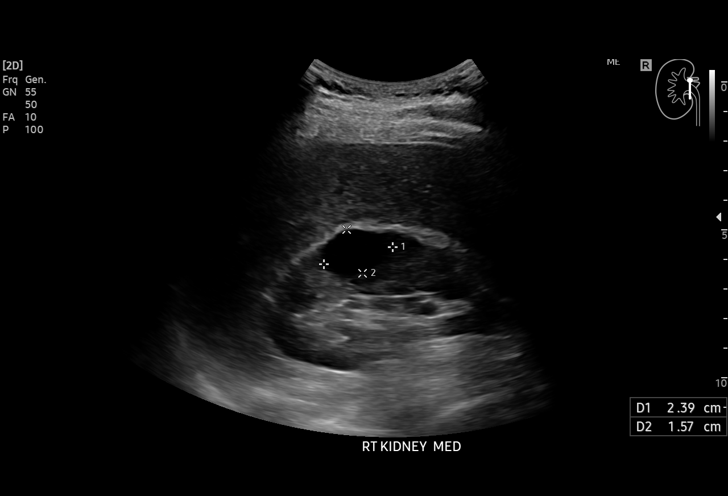
[im 9/42]
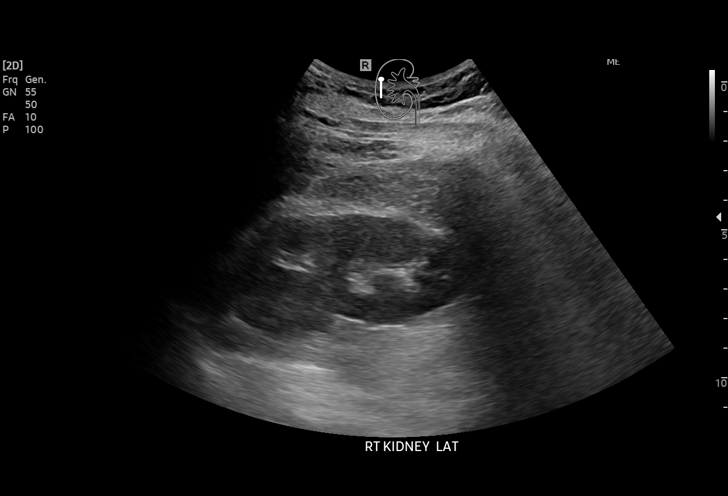
[im 12/42]
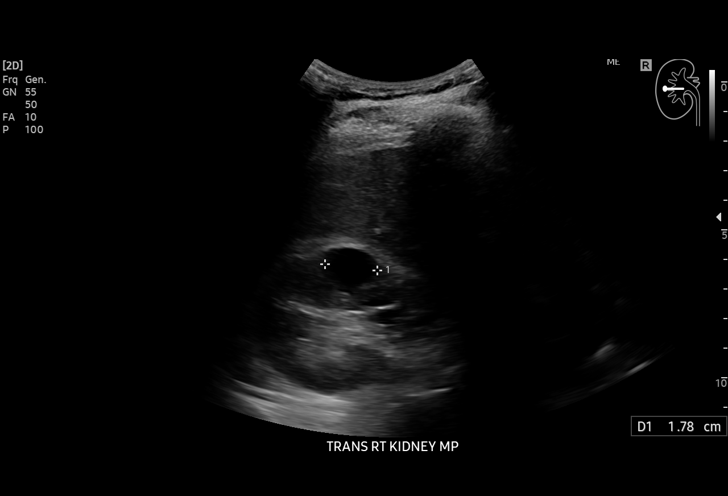
[im 16/42]
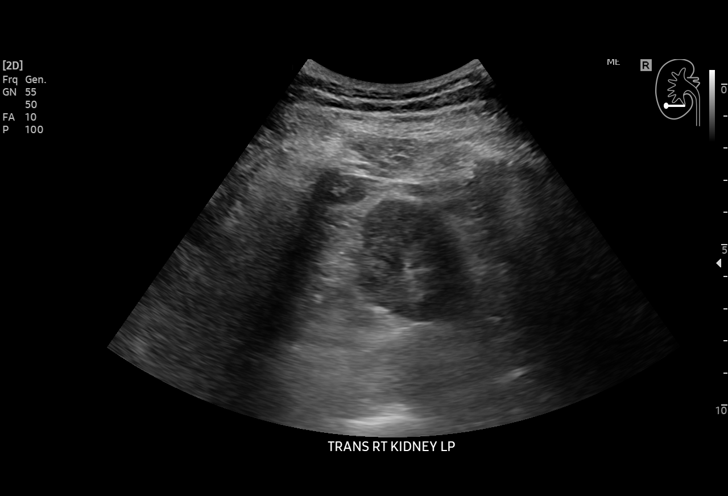
[im 18/42]
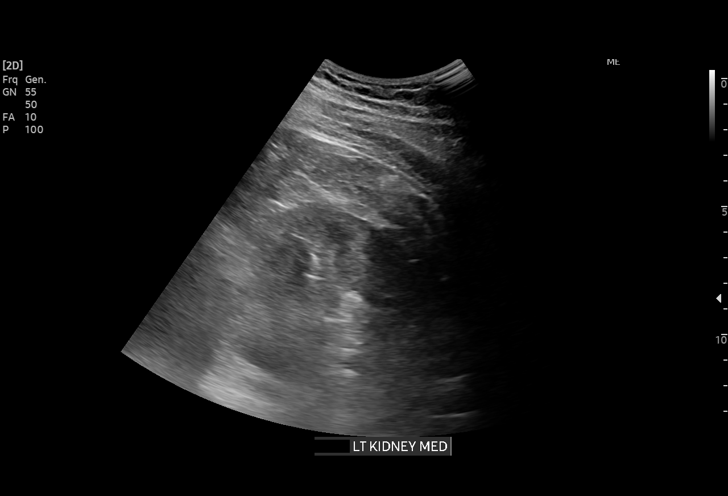
[im 21/42]
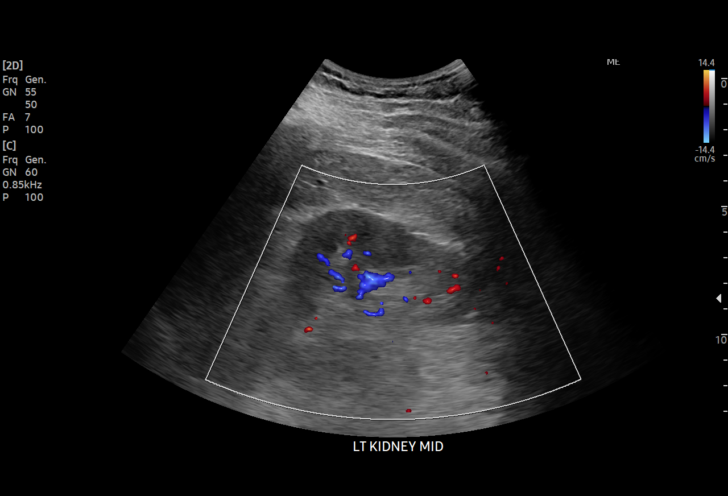
[im 24/42]
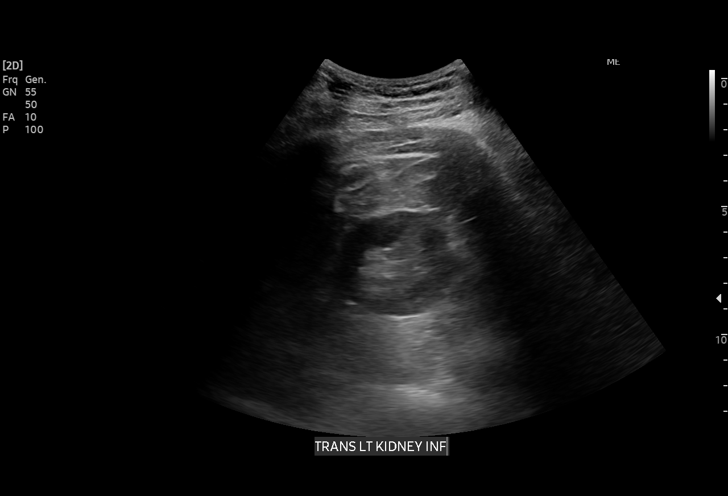
[im 26/42]
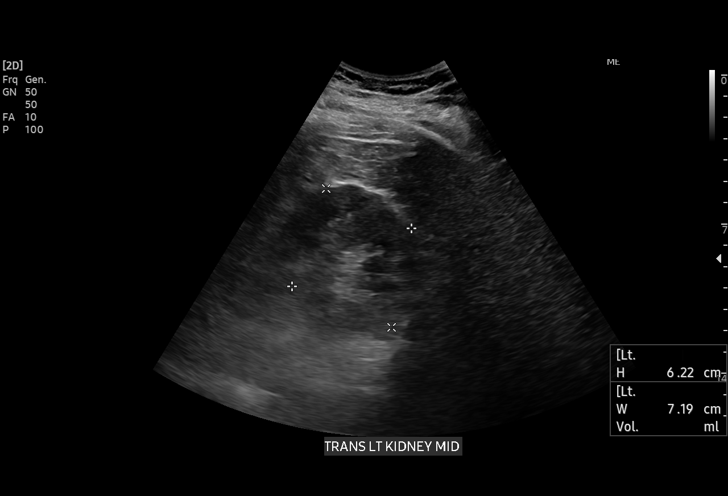
[im 30/42]
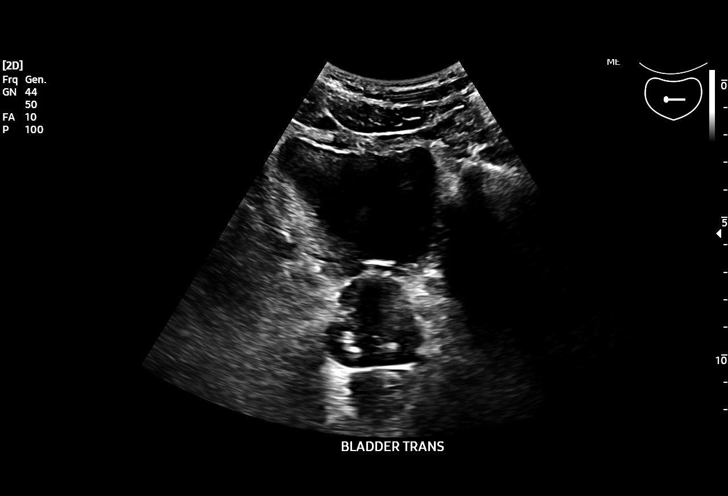
[im 33/42]
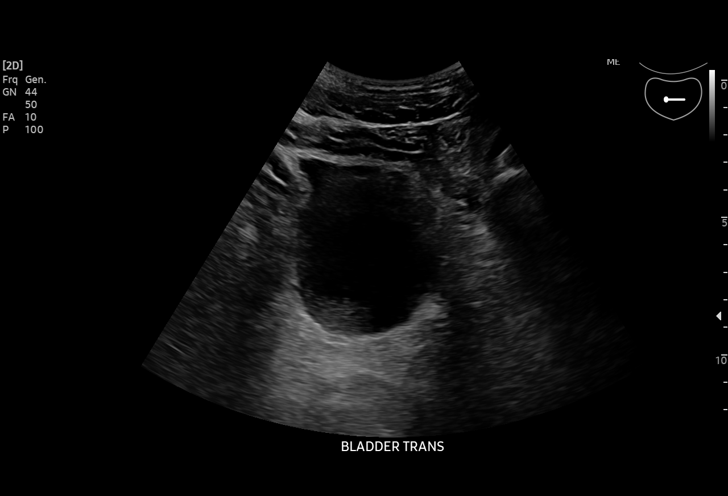
[im 35/42]
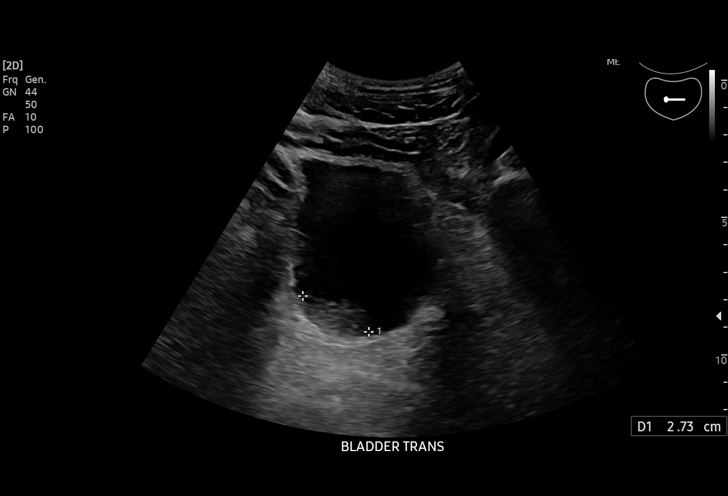
[im 38/42]
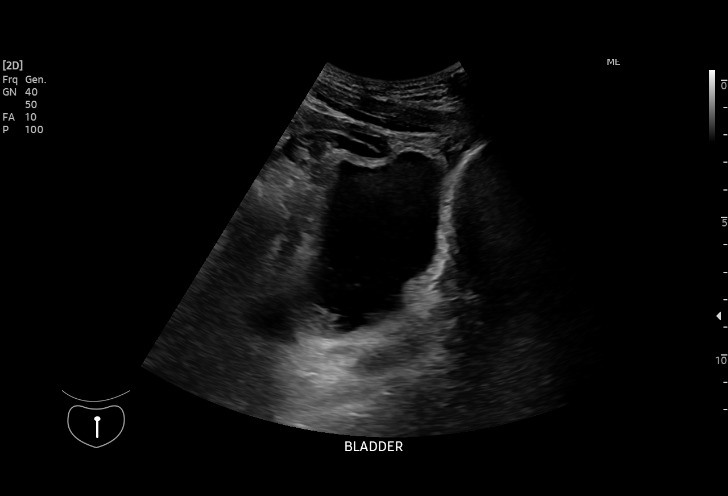
[im 42/42]
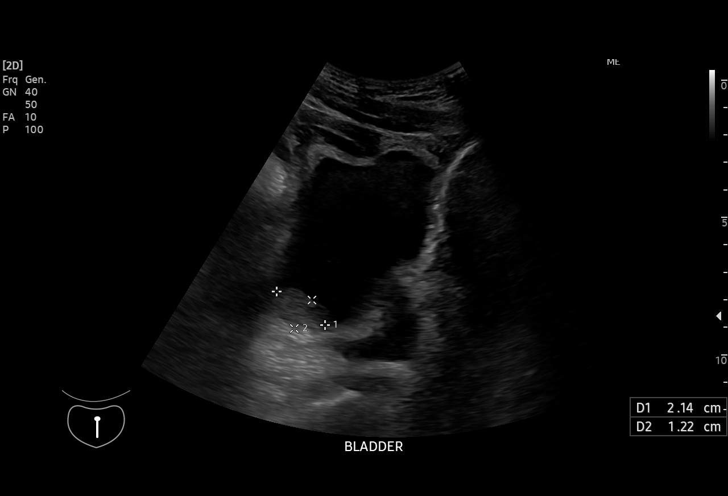

[15 of 25 positions shown; findings below may reference images not displayed]

FINDINGS: Right Kidney:

Renal measurements: 8.1 x 5.3 x 4.8 cm. = volume: 106 mL. 2.4 cm
cyst is noted in the right kidney stable from the prior exam.

Left Kidney:

Renal measurements: 8.8 x 6.2 x 7.2 cm. = volume: 205 mL.
Echogenicity within normal limits. No mass or hydronephrosis
visualized.

Bladder:

Bladder is well distended. Echogenic focus is noted along the
posterior aspect of the bladder. This measures 2.7 cm in greatest
dimension.

Other:

None.
IMPRESSION: Soft tissue lesion within the posterior wall of the bladder
suspicious for underlying mass. Direct visualization is recommended.

Right renal cyst stable from prior CT.

Otherwise normal appearing kidneys.

## 2020-01-08 ENCOUNTER — Telehealth: Payer: Self-pay | Admitting: Oncology

## 2020-01-08 NOTE — Telephone Encounter (Signed)
Called and communicated with patient about his kidney ultrasound results.  There was bladder wall soft tissue suspecting for local recurrence.  I have communicated with urology who is willing to see him for further discussion and cystoscopy.  Patient tells me that he is not interested in any further work-up.

## 2020-04-06 ENCOUNTER — Inpatient Hospital Stay: Payer: Medicare HMO

## 2020-04-06 ENCOUNTER — Inpatient Hospital Stay: Payer: Medicare HMO | Admitting: Hospice and Palliative Medicine

## 2020-04-06 ENCOUNTER — Encounter: Payer: Self-pay | Admitting: Oncology

## 2020-04-06 ENCOUNTER — Inpatient Hospital Stay (HOSPITAL_BASED_OUTPATIENT_CLINIC_OR_DEPARTMENT_OTHER): Payer: Medicare HMO | Admitting: Oncology

## 2020-04-06 ENCOUNTER — Other Ambulatory Visit: Payer: Self-pay

## 2020-04-06 ENCOUNTER — Inpatient Hospital Stay: Payer: Medicare HMO | Attending: Oncology

## 2020-04-06 VITALS — BP 127/75 | HR 17 | Temp 97.7°F | Resp 16 | Wt 199.2 lb

## 2020-04-06 DIAGNOSIS — Z880 Allergy status to penicillin: Secondary | ICD-10-CM | POA: Insufficient documentation

## 2020-04-06 DIAGNOSIS — R5383 Other fatigue: Secondary | ICD-10-CM | POA: Diagnosis not present

## 2020-04-06 DIAGNOSIS — Z7289 Other problems related to lifestyle: Secondary | ICD-10-CM | POA: Diagnosis not present

## 2020-04-06 DIAGNOSIS — Z5111 Encounter for antineoplastic chemotherapy: Secondary | ICD-10-CM | POA: Insufficient documentation

## 2020-04-06 DIAGNOSIS — C61 Malignant neoplasm of prostate: Secondary | ICD-10-CM | POA: Diagnosis not present

## 2020-04-06 DIAGNOSIS — D696 Thrombocytopenia, unspecified: Secondary | ICD-10-CM | POA: Insufficient documentation

## 2020-04-06 DIAGNOSIS — IMO0001 Reserved for inherently not codable concepts without codable children: Secondary | ICD-10-CM

## 2020-04-06 DIAGNOSIS — Z823 Family history of stroke: Secondary | ICD-10-CM | POA: Insufficient documentation

## 2020-04-06 DIAGNOSIS — R972 Elevated prostate specific antigen [PSA]: Secondary | ICD-10-CM

## 2020-04-06 DIAGNOSIS — Z79818 Long term (current) use of other agents affecting estrogen receptors and estrogen levels: Secondary | ICD-10-CM

## 2020-04-06 DIAGNOSIS — C7951 Secondary malignant neoplasm of bone: Secondary | ICD-10-CM | POA: Insufficient documentation

## 2020-04-06 DIAGNOSIS — Z841 Family history of disorders of kidney and ureter: Secondary | ICD-10-CM | POA: Diagnosis not present

## 2020-04-06 DIAGNOSIS — Z79899 Other long term (current) drug therapy: Secondary | ICD-10-CM | POA: Insufficient documentation

## 2020-04-06 DIAGNOSIS — D649 Anemia, unspecified: Secondary | ICD-10-CM | POA: Insufficient documentation

## 2020-04-06 DIAGNOSIS — Z87891 Personal history of nicotine dependence: Secondary | ICD-10-CM | POA: Diagnosis not present

## 2020-04-06 DIAGNOSIS — N1832 Chronic kidney disease, stage 3b: Secondary | ICD-10-CM | POA: Diagnosis not present

## 2020-04-06 DIAGNOSIS — R232 Flushing: Secondary | ICD-10-CM | POA: Insufficient documentation

## 2020-04-06 DIAGNOSIS — Z7189 Other specified counseling: Secondary | ICD-10-CM

## 2020-04-06 DIAGNOSIS — Z833 Family history of diabetes mellitus: Secondary | ICD-10-CM | POA: Diagnosis not present

## 2020-04-06 DIAGNOSIS — Z515 Encounter for palliative care: Secondary | ICD-10-CM

## 2020-04-06 LAB — CBC WITH DIFFERENTIAL/PLATELET
Abs Immature Granulocytes: 0.03 10*3/uL (ref 0.00–0.07)
Basophils Absolute: 0 10*3/uL (ref 0.0–0.1)
Basophils Relative: 1 %
Eosinophils Absolute: 0.1 10*3/uL (ref 0.0–0.5)
Eosinophils Relative: 1 %
HCT: 33.9 % — ABNORMAL LOW (ref 39.0–52.0)
Hemoglobin: 11.6 g/dL — ABNORMAL LOW (ref 13.0–17.0)
Immature Granulocytes: 1 %
Lymphocytes Relative: 12 %
Lymphs Abs: 0.8 10*3/uL (ref 0.7–4.0)
MCH: 35.6 pg — ABNORMAL HIGH (ref 26.0–34.0)
MCHC: 34.2 g/dL (ref 30.0–36.0)
MCV: 104 fL — ABNORMAL HIGH (ref 80.0–100.0)
Monocytes Absolute: 0.7 10*3/uL (ref 0.1–1.0)
Monocytes Relative: 12 %
Neutro Abs: 4.4 10*3/uL (ref 1.7–7.7)
Neutrophils Relative %: 73 %
Platelets: 138 10*3/uL — ABNORMAL LOW (ref 150–400)
RBC: 3.26 MIL/uL — ABNORMAL LOW (ref 4.22–5.81)
RDW: 13 % (ref 11.5–15.5)
WBC: 6 10*3/uL (ref 4.0–10.5)
nRBC: 0 % (ref 0.0–0.2)

## 2020-04-06 LAB — COMPREHENSIVE METABOLIC PANEL
ALT: 23 U/L (ref 0–44)
AST: 46 U/L — ABNORMAL HIGH (ref 15–41)
Albumin: 4.4 g/dL (ref 3.5–5.0)
Alkaline Phosphatase: 82 U/L (ref 38–126)
Anion gap: 12 (ref 5–15)
BUN: 38 mg/dL — ABNORMAL HIGH (ref 8–23)
CO2: 23 mmol/L (ref 22–32)
Calcium: 9.5 mg/dL (ref 8.9–10.3)
Chloride: 105 mmol/L (ref 98–111)
Creatinine, Ser: 1.63 mg/dL — ABNORMAL HIGH (ref 0.61–1.24)
GFR calc Af Amer: 50 mL/min — ABNORMAL LOW (ref >60)
GFR calc non Af Amer: 43 mL/min — ABNORMAL LOW (ref >60)
Glucose, Bld: 106 mg/dL — ABNORMAL HIGH (ref 70–99)
Potassium: 4.2 mmol/L (ref 3.5–5.1)
Sodium: 140 mmol/L (ref 135–145)
Total Bilirubin: 0.9 mg/dL (ref 0.3–1.2)
Total Protein: 7.3 g/dL (ref 6.5–8.1)

## 2020-04-06 LAB — PSA: Prostatic Specific Antigen: 11.92 ng/mL — ABNORMAL HIGH (ref 0.00–4.00)

## 2020-04-06 MED ORDER — LEUPROLIDE ACETATE (3 MONTH) 22.5 MG ~~LOC~~ KIT
22.5000 mg | PACK | Freq: Once | SUBCUTANEOUS | Status: AC
  Administered 2020-04-06: 22.5 mg via SUBCUTANEOUS
  Filled 2020-04-06: qty 22.5

## 2020-04-06 NOTE — Progress Notes (Signed)
[place]  [phone] [phone]   Name: Terry Mitchell Date: 04/06/2020 MRN: [id number]  DOB: 1951/01/09  Patient Care Team: [name], MD as PCP - General (Family Medicine) [name], MD as Consulting Physician (Hematology and Oncology)    REASON FOR CONSULTATION: Terry Mitchell is a 69 y.o. male with multiple medical problems including stage IV prostate cancer metastatic to bone (diagnosed August 2019) status post XRT and chemotherapy on Lupron.  He was referred to palliative care to help address goals and manage ongoing symptoms..   SOCIAL HISTORY:     reports that he quit smoking about 8 months ago. His smoking use included cigars. He smoked 2.00 packs per day. He has never used smokeless tobacco. He reports current alcohol use of about 9.0 standard drinks of alcohol per week. He reports current drug use. Drugs: Cocaine and "Crack" cocaine.   Patient lives in a boardinghouse.  He does not drive and transports himself with a scooter.  He has a girlfriend who is available to help with his care as needed.  He has 4 adult children who live around the state of [place].  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  ADVANCE DIRECTIVES:  Does not have  CODE STATUS:  PAST MEDICAL HISTORY: Past Medical History:  Diagnosis Date   Depression    recently went on disability d/t diagnosis   History of recent fall 01/2018   missed the last step on ladder, causing back discomfort   Hypertension    Prostate cancer ([place]) 12/2017   prostate    PAST SURGICAL HISTORY:  Past Surgical History:  Procedure Laterality Date   BACK SURGERY  2001    2 rods and 6 screws in back   COLONOSCOPY WITH PROPOFOL N/A 09/24/2019   Procedure: COLONOSCOPY WITH PROPOFOL;  Surgeon: Jonathon Bellows, MD;  Location: [facility] ENDOSCOPY;  Service: Gastroenterology;  Laterality: N/A;   Left shoulder surgery  Left 1998   removed a piece of bone around collar bone   PROSTATE BIOPSY N/A 02/28/2018   Procedure: PROSTATE BIOPSY;  Surgeon: Abbie Sons, MD;  Location: [facility] ORS;  Service: Urology;  Laterality: N/A;   TRANSRECTAL ULTRASOUND N/A 02/28/2018   Procedure: TRANSRECTAL ULTRASOUND;  Surgeon: Abbie Sons, MD;  Location: [facility] ORS;  Service: Urology;  Laterality: N/A;    HEMATOLOGY/ONCOLOGY HISTORY:  Oncology History  Prostate cancer ([place])  03/19/2018 Initial Diagnosis   Prostate cancer ([place])   03/19/2018 Cancer Staging   Staging form: Prostate, AJCC 8th Edition - Clinical stage from 03/19/2018: Stage IVB (cTX, cN1, cM1b) - Signed by [name], MD on 05/01/2019   05/15/2019 -  Chemotherapy   The patient had pegfilgrastim-jmdb (FULPHILA) injection 6 mg, 6 mg, Subcutaneous,  Once, 6 of 6 cycles Administration: 6 mg (05/18/2019), 6 mg (07/06/2019), 6 mg (07/28/2019), 6 mg (09/15/2019), 6 mg (10/06/2019), 6 mg (08/18/2019) DOCEtaxel (TAXOTERE) 160 mg in sodium chloride 0.9 % 250 mL chemo infusion, 75 mg/m2 = 160 mg, Intravenous,  Once, 6 of 6 cycles Administration: 160 mg (05/15/2019), 160 mg (07/03/2019), 160 mg (07/27/2019), 160 mg (09/14/2019), 160 mg (10/05/2019), 160 mg (08/17/2019)  for chemotherapy treatment.      ALLERGIES:  is allergic to penicillins and lactose intolerance (gi).  MEDICATIONS:  Current Outpatient Medications  Medication Sig Dispense Refill   albuterol (VENTOLIN HFA) 108 (90 Base) MCG/ACT inhaler Inhale 2 puffs into the lungs every 6 (six) hours  as needed for wheezing or shortness of breath. 6.7 g 0   amLODipine (NORVASC) 5 MG tablet Take 1 tablet (5 mg total) by mouth daily. 30 tablet 0   atorvastatin (LIPITOR) 40 MG tablet      loratadine (CLARITIN) 10 MG tablet Take 10 mg by mouth daily. In prep for fulphila (Patient not taking: Reported on 04/06/2020)     losartan (COZAAR) 50 MG tablet Take 50 mg by mouth daily after breakfast.   2   ondansetron (ZOFRAN) 8 MG  tablet Take 1 tablet (8 mg total) by mouth 2 (two) times daily as needed for refractory nausea / vomiting. 30 tablet 1   prochlorperazine (COMPAZINE) 10 MG tablet Take 1 tablet (10 mg total) by mouth every 6 (six) hours as needed (Nausea or vomiting). (Patient not taking: Reported on 11/02/2019) 30 tablet 1   vitamin B-12 (CYANOCOBALAMIN) 1000 MCG tablet Take 1 tablet (1,000 mcg total) by mouth daily. 90 tablet 1   No current facility-administered medications for this visit.   Facility-Administered Medications Ordered in Other Visits  Medication Dose Route Frequency Provider Last Rate Last Admin   sodium chloride flush (NS) 0.9 % injection 10 mL  10 mL Intravenous Once [name], MD        VITAL SIGNS: There were no vitals taken for this visit. There were no vitals filed for this visit.  Estimated body mass index is 27.02 kg/m as calculated from the following:   Height as of 09/24/19: 6' (1.829 m).   Weight as of an earlier encounter on 04/06/20: 199 lb 3.2 oz (90.4 kg).  LABS: CBC:    Component Value Date/Time   WBC 6.0 04/06/2020 0905   HGB 11.6 (L) 04/06/2020 0905   HCT 33.9 (L) 04/06/2020 0905   PLT 138 (L) 04/06/2020 0905   MCV 104.0 (H) 04/06/2020 0905   NEUTROABS 4.4 04/06/2020 0905   LYMPHSABS 0.8 04/06/2020 0905   MONOABS 0.7 04/06/2020 0905   EOSABS 0.1 04/06/2020 0905   BASOSABS 0.0 04/06/2020 0905   Comprehensive Metabolic Panel:    Component Value Date/Time   NA 140 04/06/2020 0905   K 4.2 04/06/2020 0905   CL 105 04/06/2020 0905   CO2 23 04/06/2020 0905   BUN 38 (H) 04/06/2020 0905   BUN 19 01/01/2018 1509   CREATININE 1.63 (H) 04/06/2020 0905   GLUCOSE 106 (H) 04/06/2020 0905   CALCIUM 9.5 04/06/2020 0905   AST 46 (H) 04/06/2020 0905   ALT 23 04/06/2020 0905   ALKPHOS 82 04/06/2020 0905   BILITOT 0.9 04/06/2020 0905   PROT 7.3 04/06/2020 0905   ALBUMIN 4.4 04/06/2020 0905    RADIOGRAPHIC STUDIES: No results found.  PERFORMANCE STATUS (ECOG) : 1 -  Symptomatic but completely ambulatory  Review of Systems Unless otherwise noted, a complete review of systems is negative.  Physical Exam General: NAD Pulmonary: unlabored Extremities: no edema, no joint deformities Skin: no rashes Neurological: Weakness but otherwise nonfocal  IMPRESSION: Routine follow-up visit.    Patient reports he is doing well.  He denies any symptomatic complaints.  No changes or concerns today.  He continues to live at the boardinghouse but feels safe and is spending more time with his girlfriend.  He speaks about her LVAD but says overall she is doing well.  Sounds like he is her primary caregiver.  Today, I reviewed with patient ACP documents.  He says that he would like his daughter [name] to be his [facility] POA.  He intends  to take ACP documents by the bank and have them notarized.  Also reviewed with patient a MOST form, which she took home to discuss with family.  PLAN: -Continue current scope of treatment -ACP/MOST form reviewed -RTC in about a month   Patient expressed understanding and was in agreement with this plan. He also understands that He can call the clinic at any time with any questions, concerns, or complaints.     Time Total: 15 minutes  Visit consisted of counseling and education dealing with the complex and emotionally intense issues of symptom management and palliative care in the setting of serious and potentially life-threatening illness.Greater than 50%  of this time was spent counseling and coordinating care related to the above assessment and plan.  Signed by: Altha Harm, PhD, NP-C

## 2020-04-06 NOTE — Progress Notes (Addendum)
Hematology/Oncology Follow up note [facility] Telephone:(336) [phone] [phone]) [phone]   Patient Care Team: [name], MD as PCP - General (Family Medicine) [name], MD as Consulting Physician (Hematology and Oncology)  REASON FOR VISIT:  Follow up for prostate cancer.  HISTORY OF PRESENTING ILLNESS:  Terry Mitchell is a  69 y.o.  male with PMH listed below who was referred to me for evaluation of elevated PSA. Patient was referred by primary care physician to urology due to elevated PSA at the level of 72.  Per note Patient was referred to urology for further management.  Staging imaging has been ordered including abdominal and pelvis CT with contrast and bone scan.  Patient was also given Mills Koller for treatment of clinical prostate cancer.  Patient reports that he was scheduled to return to urologist office for biopsy. Patient denies any dysuria, hematuria, fever or chills.  He lives by himself. Denies any bone pain.  Reports some soreness at the area of [place] shots, as well as hot flushes.  Otherwise no complaints.  #01/01/2018 Patient received Firmagon loading dose at urologist office  # patient had a prostate biopsy on 02/28/2018 pathology showed prostate adenocarcinoma with androgen deprivation treatment effect.  Adenocarcinoma is present in all core fragments with involvement of 50% or more of each core.  Perineural invasion is present. As patient has received Firmagon prior to biopsy, Gleason grade cannot be reliably assessed after androgen deprivation therapy.  #  discussed with patient's urologist Dr. Bernardo Heater who did patient's prostate biopsy.  He agrees that given Mills Koller prior to biopsy and staging images are not typical.  He agrees that PET scan is consistent with lymph node involvement.  He does not think patient is a good candidate for radical prostatectomy.  He agrees with me about the plan that definitive radiation is a good option.  #  September 2019  external Beam radiation. He was scheduled to have I-125 interstitial implant although that can not been done so patient underwent external beam IMRT prostate boost, finished in January 2020.  # patient was on androgen deprivation therapy with Firmagon injections, until August 2020. Declined ADT due to vasomotor symptoms.   #Blood work from 04/14/2019 showed testosterone level of 284, PSA has been decreased to 24.18.  # 04/28/2019 CT chest abdomen pelvis and bone scan images were independently reviewed by me and discussed with patient. Images are consistent with metastatic prostate cancer.  # 09/24/2019 colonoscopy.  Descending colon polyp, resected.  Pathology showed tubular adenoma.  Negative for high-grade dysplasia or malignancy. # # 09/25/2019 Endoscopy  # 1023/2021- 10/05/2019  6 cycles of Docetaxel treatments  INTERVAL HISTORY Terry Mitchell is a 69 y.o. male who has above history reviewed by me today presents for follow-up visit for management of Stage IVA (cTX, cN1, cM1) metastatic prostate cancer 01/06/20 US renal showed soft tissue lesion within the posterior wall of bladder suspicious for underlying mass.  I called patient at that time and he declines urology referral or additional work up.  He continues to have hot flash. No new complaints. No hematuria, abdominal pain, difficulty in passing urine. He reports drinking a lot of fluid during the day and urinates a lot.  No nausea, vomiting, diarrhea   Review of Systems  Constitutional: Positive for fatigue. Negative for appetite change, chills, fever and unexpected weight change.  HENT:   Negative for hearing loss and voice change.   Eyes: Negative for eye problems and icterus.  Respiratory:  Negative for chest tightness, cough and shortness of breath.   Cardiovascular: Negative for chest pain and leg swelling.  Gastrointestinal: Negative for abdominal distention, abdominal pain and blood in stool.  Endocrine: Positive  for hot flashes.  Genitourinary: Negative for difficulty urinating, dysuria and frequency.   Musculoskeletal: Negative for arthralgias.  Skin: Negative for itching and rash.  Neurological: Negative for extremity weakness, light-headedness and numbness.  Hematological: Negative for adenopathy. Does not bruise/bleed easily.  Psychiatric/Behavioral: Negative for confusion.     MEDICAL HISTORY:  Past Medical History:  Diagnosis Date  . Depression    recently went on disability d/t diagnosis  . History of recent fall 01/2018   missed the last step on ladder, causing back discomfort  . Hypertension   . Prostate cancer ([place]) 12/2017   prostate    SURGICAL HISTORY: Past Surgical History:  Procedure Laterality Date  . BACK SURGERY  2001    2 rods and 6 screws in back  . COLONOSCOPY WITH PROPOFOL N/A 09/24/2019   Procedure: COLONOSCOPY WITH PROPOFOL;  Surgeon: Jonathon Bellows, MD;  Location: [facility] ENDOSCOPY;  Service: Gastroenterology;  Laterality: N/A;  . Left shoulder surgery Left 1998   removed a piece of bone around collar bone  . PROSTATE BIOPSY N/A 02/28/2018   Procedure: PROSTATE BIOPSY;  Surgeon: Abbie Sons, MD;  Location: [facility] ORS;  Service: Urology;  Laterality: N/A;  . TRANSRECTAL ULTRASOUND N/A 02/28/2018   Procedure: TRANSRECTAL ULTRASOUND;  Surgeon: Abbie Sons, MD;  Location: [facility] ORS;  Service: Urology;  Laterality: N/A;    SOCIAL HISTORY: Social History   Socioeconomic History  . Marital status: Single    Spouse name: Not on file  . Number of children: 4  . Years of education: Not on file  . Highest education level: Not on file  Occupational History  . Occupation: disability  Tobacco Use  . Smoking status: Former Smoker    Packs/day: 2.00    Types: Cigars    Quit date: 07/24/2019    Years since quitting: 0.7  . Smokeless tobacco: Never Used  . Tobacco comment: 2 cigars  Vaping Use  . Vaping Use: Never used  Substance and Sexual Activity  . Alcohol use:  Yes    Alcohol/week: 9.0 standard drinks    Types: 9 Cans of beer per week    Comment: occasional  . Drug use: Yes    Types: Cocaine, "Crack" cocaine    Comment: none within the year  . Sexual activity: Not Currently  Other Topics Concern  . Not on file  Social History Narrative  . Not on file   Social Determinants of Health   Financial Resource Strain:   . Difficulty of Paying Living Expenses: Not on file  Food Insecurity:   . Worried About Charity fundraiser in the Last Year: Not on file  . Ran Out of Food in the Last Year: Not on file  Transportation Needs:   . Lack of Transportation (Medical): Not on file  . Lack of Transportation (Non-Medical): Not on file  Physical Activity:   . Days of Exercise per Week: Not on file  . Minutes of Exercise per Session: Not on file  Stress:   . Feeling of Stress : Not on file  Social Connections:   . Frequency of Communication with Friends and Family: Not on file  . Frequency of Social Gatherings with Friends and Family: Not on file  . Attends Religious Services: Not on file  .  Active Member of Clubs or Organizations: Not on file  . Attends Archivist Meetings: Not on file  . Marital Status: Not on file  Intimate Partner Violence:   . Fear of Current or Ex-Partner: Not on file  . Emotionally Abused: Not on file  . Physically Abused: Not on file  . Sexually Abused: Not on file    FAMILY HISTORY: Family History  Problem Relation Age of Onset  . Stroke Mother   . Kidney failure Mother   . Diabetes Maternal Aunt     ALLERGIES:  is allergic to penicillins and lactose intolerance (gi).  MEDICATIONS:  Current Outpatient Medications  Medication Sig Dispense Refill  . albuterol (VENTOLIN HFA) 108 (90 Base) MCG/ACT inhaler Inhale 2 puffs into the lungs every 6 (six) hours as needed for wheezing or shortness of breath. 6.7 g 0  . amLODipine (NORVASC) 5 MG tablet Take 1 tablet (5 mg total) by mouth daily. 30 tablet 0  .  atorvastatin (LIPITOR) 40 MG tablet     . losartan (COZAAR) 50 MG tablet Take 50 mg by mouth daily after breakfast.   2  . ondansetron (ZOFRAN) 8 MG tablet Take 1 tablet (8 mg total) by mouth 2 (two) times daily as needed for refractory nausea / vomiting. 30 tablet 1  . vitamin B-12 (CYANOCOBALAMIN) 1000 MCG tablet Take 1 tablet (1,000 mcg total) by mouth daily. 90 tablet 1  . loratadine (CLARITIN) 10 MG tablet Take 10 mg by mouth daily. In prep for fulphila (Patient not taking: Reported on 04/06/2020)    . prochlorperazine (COMPAZINE) 10 MG tablet Take 1 tablet (10 mg total) by mouth every 6 (six) hours as needed (Nausea or vomiting). (Patient not taking: Reported on 11/02/2019) 30 tablet 1   No current facility-administered medications for this visit.   Facility-Administered Medications Ordered in Other Visits  Medication Dose Route Frequency Provider Last Rate Last Admin  . sodium chloride flush (NS) 0.9 % injection 10 mL  10 mL Intravenous Once [name], MD         PHYSICAL EXAMINATION: ECOG PERFORMANCE STATUS: 1 - Symptomatic but completely ambulatory Vitals:   04/06/20 0929  BP: 127/75  Pulse: (!) 17  Resp: 16  Temp: 97.7 F (36.5 C)   Filed Weights   04/06/20 0929  Weight: 199 lb 3.2 oz (90.4 kg)    Physical Exam Constitutional:      General: He is not in acute distress. HENT:     Head: Normocephalic and atraumatic.  Eyes:     General: No scleral icterus.    Conjunctiva/sclera: Conjunctivae normal.     Pupils: Pupils are equal, round, and reactive to light.  Cardiovascular:     Rate and Rhythm: Normal rate and regular rhythm.     Heart sounds: Normal heart sounds.  Pulmonary:     Effort: Pulmonary effort is normal. No respiratory distress.     Breath sounds: No wheezing.  Abdominal:     General: Bowel sounds are normal. There is no distension.     Palpations: Abdomen is soft. There is no mass.     Tenderness: There is no abdominal tenderness.  Musculoskeletal:         General: No deformity. Normal range of motion.     Cervical back: Normal range of motion and neck supple.  Lymphadenopathy:     Cervical: No cervical adenopathy.  Skin:    General: Skin is warm and dry.     Findings: No erythema  or rash.  Neurological:     General: No focal deficit present.     Mental Status: He is alert and oriented to person, place, and time. Mental status is at baseline.     Cranial Nerves: No cranial nerve deficit.     Coordination: Coordination normal.  Psychiatric:        Mood and Affect: Mood normal.      LABORATORY DATA:  I have reviewed the data as listed Lab Results  Component Value Date   WBC 6.0 04/06/2020   HGB 11.6 (L) 04/06/2020   HCT 33.9 (L) 04/06/2020   MCV 104.0 (H) 04/06/2020   PLT 138 (L) 04/06/2020   Recent Labs    11/03/19 1002 01/05/20 1252 04/06/20 0905  NA 141 142 140  K 3.6 3.8 4.2  CL 103 108 105  CO2 24 23 23   GLUCOSE 93 100* 106*  BUN 27* 35* 38*  CREATININE 1.38* 1.96* 1.63*  CALCIUM 8.9 9.6 9.5  GFRNONAA 53* 34* 43*  GFRAA >60 40* 50*  PROT 6.8 7.3 7.3  ALBUMIN 4.0 4.4 4.4  AST 33 41 46*  ALT 19 21 23   ALKPHOS 94 84 82  BILITOT 0.9 0.9 0.9   Iron/TIBC/Ferritin/ %Sat No results found for: IRON, TIBC, FERRITIN, IRONPCTSAT   RADIOGRAPHIC STUDIES: I have personally reviewed the radiological images as listed and agreed with the findings in the report. No results found.   ASSESSMENT & PLAN:  1. Androgen deprivation therapy   2. Stage 3b chronic kidney disease   3. Prostate cancer metastatic to bone [facility])   Cancer Staging Prostate cancer [facility]) Staging form: Prostate, AJCC 8th Edition - Clinical stage from 03/19/2018: Stage IVB (cTX, cN1, cM1b) - Signed by [name], MD on 05/01/2019  #Metastatic prostate Cancer, previously castration sensitive, s/p upfront docetaxel x 6.  PSA 24.18--> 33.15-->52.75-->13.98--> 7.7--> 5.51-->5.58-->6.02-->4.35-->5.58 PSA has been trending up. Today's PSA is pending.  Testosterone is pending. Too.  Continue ADT-leupron 22.5mg  Q3 month  I discussed with patient about US kidney finding, the unexplained deteriation of kidney function, rising of PSA, talked about castration resistance prostate cancer.  I recommend additional images and recommend adding addition chemotherapy or TKI. Patient appreciates explanation and he adamantly refused additional image work up, urology work up. He is not interested in chemotherapy. He is ok with TKI if needed.   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 Recommend to add Xtandi 160mg  daily. Rationale and potential side effects were discussed with patient and he is in agreement.   # CKD Creatinine further trend up to 1.63. avoid nephrotoxins.    # Anemia, hemoglobin is stable. Chronic macrocytosis.   # thrombocytopenia platelet count is stable.  Follow up in 2 weeks.    [name], MD, PhD Hematology Oncology [facility] Pager- [phone] 04/06/2020

## 2020-04-06 NOTE — Progress Notes (Signed)
Patient has occasional nausea that is relieved with Compazine.  Also has diarrhea about 1 day a week that improves without med.

## 2020-04-07 LAB — TESTOSTERONE: Testosterone: 6 ng/dL — ABNORMAL LOW (ref 264–916)

## 2020-04-08 MED ORDER — XTANDI 40 MG PO CAPS: 160.0000 mg | ORAL_CAPSULE | Freq: Every day | ORAL | 0 refills | Status: DC

## 2020-04-08 NOTE — Progress Notes (Signed)
DISCONTINUE ON PATHWAY REGIMEN - Prostate     A cycle is every 21 days:     Docetaxel   **Always confirm dose/schedule in your pharmacy ordering system**  REASON: Disease Progression PRIOR TREATMENT: POS96: Docetaxel 75 mg/m2 q21 Days Without Prednisone x 6 Cycles with Medical Castration TREATMENT RESPONSE: Progressive Disease (PD)  START ON PATHWAY REGIMEN - Prostate     Daily:     Enzalutamide   **Always confirm dose/schedule in your pharmacy ordering system**  Patient Characteristics: Adenocarcinoma, Recurrent/New Systemic Disease, Castration Resistant, M1, No Prior Novel Hormonal Agent Histology: Adenocarcinoma Therapeutic Status: Recurrent/New Systemic Disease  Intent of Therapy: Non-Curative / Palliative Intent, Discussed with Patient

## 2020-04-08 NOTE — Addendum Note (Signed)
Addended by: [name] on: 04/08/2020 11:20 PM   Modules accepted: Orders

## 2020-04-11 ENCOUNTER — Telehealth: Payer: Self-pay | Admitting: Pharmacy Technician

## 2020-04-11 ENCOUNTER — Telehealth: Payer: Self-pay | Admitting: Pharmacist

## 2020-04-11 DIAGNOSIS — C61 Malignant neoplasm of prostate: Secondary | ICD-10-CM

## 2020-04-11 NOTE — Telephone Encounter (Signed)
Oral Oncology Patient Advocate Encounter  Prior Authorization for Terry Mitchell has been approved.    PA# [id number]  Effective dates: 04/11/20 through 10/08/20  Patients co-pay is $4.00  Oral Oncology Clinic will continue to follow.   [place] Patient [place] Phone [phone] Fax [phone] 04/11/2020 2:19 PM

## 2020-04-11 NOTE — Telephone Encounter (Signed)
Oral Oncology Patient Advocate Encounter  Received notification from [facility] that prior authorization for Terry Mitchell is required.  PA submitted on CoverMyMeds Key [id number]  Status is pending  Oral Oncology Clinic will continue to follow.  [place] Patient [place] Phone [phone] Fax [phone] 04/11/2020 2:05 PM

## 2020-04-11 NOTE — Telephone Encounter (Signed)
Oral Oncology Pharmacy Student Encounter  Received new prescription for Xtandi (enzalutamide) for the treatment of metastatic prostatic cancer, planned duration until disease progression or unacceptable drug toxicity.  CMP and CBC with differential from 04/06/20 assessed, AST slightly elevated at 46, eGFR 43 ml/min (Scr 1.63), Hb 11.6 (appears to be baseline), RBC 3.26, HCT 33.9. Testosterone level indicative of castration - 6 ng/dL. Prescription dose and frequency assessed.   Current medication list in Epic reviewed, one DDIs with Xtandi identified: may lower concentration of atorvastatin via CYP3A4 induction.   Evaluated chart and identified possible patient barriers to medication adherence: doesn't drive- transports via scooter with girlfriend assistance as needed, endorsed current drug use (cocaine and "crack" cocaine).  Prescription has been e-scribed to the [facility] for benefits analysis and approval.  Oral Oncology Clinic will continue to follow for insurance authorization, copayment issues, initial counseling and start date.  Lowella Fairy D Candidate 2022  [facility] Oral Chemotherapy Navigation Clinic [phone]  04/11/2020 10:47 AM

## 2020-04-13 ENCOUNTER — Telehealth: Payer: Self-pay

## 2020-04-13 MED ORDER — ENZALUTAMIDE 40 MG PO TABS: 160.0000 mg | ORAL_TABLET | Freq: Every day | ORAL | 0 refills | Status: DC

## 2020-04-13 MED ORDER — XTANDI 40 MG PO CAPS: 160.0000 mg | ORAL_CAPSULE | Freq: Every day | ORAL | 0 refills | Status: DC

## 2020-04-13 MED FILL — XTANDI 40 MG TABS: 40 | 30 days supply | Qty: 120 | Fill #0

## 2020-04-13 NOTE — Telephone Encounter (Signed)
Oral Chemotherapy Pharmacist Encounter  Patient Education I spoke with patient for overview of new oral chemotherapy medication: Xtandi (enzalutamide) for the treatment of metastatic prostatic cancer, planned duration until disease progression or unacceptable drug toxicity.   Pt is doing well. Counseled patient on administration, dosing, side effects, monitoring, drug-food interactions, safe handling, storage, and disposal. Patient will take 4 tablets (160 mg total) by mouth daily.  Side effects include but not limited to: fatigue, HTN, and seizure risk (minimal).    Reviewed with patient importance of keeping a medication schedule and plan for any missed doses.  After discussion with patient no patient barriers to medication adherence identified.   Mr. Evitts voiced understanding and appreciation. All questions answered. Medication handout provided.  Provided patient with [facility] [place] [facility] phone number. Patient knows to call the office with questions or concerns. Oral Chemotherapy Navigation Clinic will continue to follow.  Darl Pikes, PharmD, BCPS, BCOP, CPP Hematology/Oncology Clinical Pharmacist Practitioner [facility] [phone]  04/13/2020 3:03 PM

## 2020-04-13 NOTE — Telephone Encounter (Signed)
-----   Message from [name], MD sent at 04/13/2020  3:53 PM EDT ----- Yes. Thanks.  Team, please schedule on 04/28/20 ----- Message ----- From: Darl Pikes, RPH-CPP Sent: 04/13/2020   3:35 PM EDT To: Evelina Dun, RN, [place], #  Dr. Tasia Catchings,  Good afternoon! Spoke with [name] today. His Gillermina Phy will be delivered tomorrow and he knows to get started when he receives the medication. For his 2 week follow-up, can that be on Thursday 04/28/20 so that I can see him in pharmacy clinic. I have AM appt available for a bettter match with your schedule that day.  Clearnce Sorrel  ----- Message ----- From: [name], MD Sent: 04/08/2020  11:20 PM EDT To: Evelina Dun, RN, Vanice Sarah, CMA, #  Clearnce Sorrel, I plan to start him on Xtandi 160mg , please check insurance coverage and please proceed if covered. Please inform me and team about his start date. Plan to arrange him to see me 2 weeks after start date. Thanks.   Talbert Cage

## 2020-04-13 NOTE — Addendum Note (Signed)
Addended by: Darl Pikes on: 04/13/2020 04:36 PM   Modules accepted: Orders

## 2020-04-13 NOTE — Telephone Encounter (Signed)
Oral Oncology Patient Advocate Encounter  I spoke with Terry Mitchell this afternoon to set up delivery of Xtandi.  Address verified for shipment.  Gillermina Phy will be filled through [facility] and mailed 04/13/20 for delivery 04/14/20.    [place] will call 7-10 days before next refill is due to complete adherence call and set up delivery of medication.     [place] Patient [place] Phone [phone] Fax [phone] 04/13/2020 3:01 PM

## 2020-04-13 NOTE — Telephone Encounter (Addendum)
Please schedule patient for lab/MD in 2 weeks & notify pt of appts. Thanks   Terry Mitchell will be out of office on 10/7, she will give pt information over the phone.

## 2020-04-13 NOTE — Telephone Encounter (Signed)
Done.. Pt has been scheduled for lab/MD on 10/7 as requested Pt was made aware

## 2020-04-28 ENCOUNTER — Inpatient Hospital Stay: Payer: Medicare HMO | Admitting: Oncology

## 2020-04-28 ENCOUNTER — Inpatient Hospital Stay: Payer: Medicare HMO

## 2020-05-05 ENCOUNTER — Other Ambulatory Visit: Payer: Self-pay

## 2020-05-05 ENCOUNTER — Encounter: Payer: Self-pay | Admitting: Oncology

## 2020-05-05 NOTE — Progress Notes (Signed)
The patient reports he has stop taking Xtandi 40 mg due to vomiting and diarrhea. The patient Name and DOB has been verified by phone today.

## 2020-05-06 ENCOUNTER — Encounter: Payer: Self-pay | Admitting: Oncology

## 2020-05-06 ENCOUNTER — Inpatient Hospital Stay: Payer: Medicare HMO | Attending: Oncology

## 2020-05-06 ENCOUNTER — Inpatient Hospital Stay (HOSPITAL_BASED_OUTPATIENT_CLINIC_OR_DEPARTMENT_OTHER): Payer: Medicare HMO | Admitting: Oncology

## 2020-05-06 VITALS — BP 115/70 | HR 62 | Temp 97.6°F | Resp 18 | Ht 72.0 in | Wt 197.0 lb

## 2020-05-06 DIAGNOSIS — Z880 Allergy status to penicillin: Secondary | ICD-10-CM | POA: Insufficient documentation

## 2020-05-06 DIAGNOSIS — R232 Flushing: Secondary | ICD-10-CM | POA: Insufficient documentation

## 2020-05-06 DIAGNOSIS — Z79899 Other long term (current) drug therapy: Secondary | ICD-10-CM | POA: Diagnosis not present

## 2020-05-06 DIAGNOSIS — F1490 Cocaine use, unspecified, uncomplicated: Secondary | ICD-10-CM | POA: Insufficient documentation

## 2020-05-06 DIAGNOSIS — R5383 Other fatigue: Secondary | ICD-10-CM | POA: Diagnosis not present

## 2020-05-06 DIAGNOSIS — Z87891 Personal history of nicotine dependence: Secondary | ICD-10-CM | POA: Diagnosis not present

## 2020-05-06 DIAGNOSIS — Z823 Family history of stroke: Secondary | ICD-10-CM | POA: Diagnosis not present

## 2020-05-06 DIAGNOSIS — Z833 Family history of diabetes mellitus: Secondary | ICD-10-CM | POA: Diagnosis not present

## 2020-05-06 DIAGNOSIS — C61 Malignant neoplasm of prostate: Secondary | ICD-10-CM

## 2020-05-06 DIAGNOSIS — Z7189 Other specified counseling: Secondary | ICD-10-CM

## 2020-05-06 DIAGNOSIS — Z841 Family history of disorders of kidney and ureter: Secondary | ICD-10-CM | POA: Diagnosis not present

## 2020-05-06 DIAGNOSIS — Z7289 Other problems related to lifestyle: Secondary | ICD-10-CM | POA: Diagnosis not present

## 2020-05-06 LAB — CBC WITH DIFFERENTIAL/PLATELET
Abs Immature Granulocytes: 0.01 10*3/uL (ref 0.00–0.07)
Basophils Absolute: 0 10*3/uL (ref 0.0–0.1)
Basophils Relative: 0 %
Eosinophils Absolute: 0.1 10*3/uL (ref 0.0–0.5)
Eosinophils Relative: 1 %
HCT: 33.1 % — ABNORMAL LOW (ref 39.0–52.0)
Hemoglobin: 11 g/dL — ABNORMAL LOW (ref 13.0–17.0)
Immature Granulocytes: 0 %
Lymphocytes Relative: 16 %
Lymphs Abs: 0.7 10*3/uL (ref 0.7–4.0)
MCH: 35.9 pg — ABNORMAL HIGH (ref 26.0–34.0)
MCHC: 33.2 g/dL (ref 30.0–36.0)
MCV: 108.2 fL — ABNORMAL HIGH (ref 80.0–100.0)
Monocytes Absolute: 0.4 10*3/uL (ref 0.1–1.0)
Monocytes Relative: 9 %
Neutro Abs: 3.3 10*3/uL (ref 1.7–7.7)
Neutrophils Relative %: 74 %
Platelets: 154 10*3/uL (ref 150–400)
RBC: 3.06 MIL/uL — ABNORMAL LOW (ref 4.22–5.81)
RDW: 12.7 % (ref 11.5–15.5)
WBC: 4.5 10*3/uL (ref 4.0–10.5)
nRBC: 0 % (ref 0.0–0.2)

## 2020-05-06 LAB — COMPREHENSIVE METABOLIC PANEL
ALT: 20 U/L (ref 0–44)
AST: 33 U/L (ref 15–41)
Albumin: 4.1 g/dL (ref 3.5–5.0)
Alkaline Phosphatase: 86 U/L (ref 38–126)
Anion gap: 12 (ref 5–15)
BUN: 45 mg/dL — ABNORMAL HIGH (ref 8–23)
CO2: 24 mmol/L (ref 22–32)
Calcium: 9.4 mg/dL (ref 8.9–10.3)
Chloride: 105 mmol/L (ref 98–111)
Creatinine, Ser: 2.32 mg/dL — ABNORMAL HIGH (ref 0.61–1.24)
GFR, Estimated: 28 mL/min — ABNORMAL LOW (ref >60)
Glucose, Bld: 116 mg/dL — ABNORMAL HIGH (ref 70–99)
Potassium: 4.3 mmol/L (ref 3.5–5.1)
Sodium: 141 mmol/L (ref 135–145)
Total Bilirubin: 0.7 mg/dL (ref 0.3–1.2)
Total Protein: 6.9 g/dL (ref 6.5–8.1)

## 2020-05-06 LAB — PSA: Prostatic Specific Antigen: 14.5 ng/mL — ABNORMAL HIGH (ref 0.00–4.00)

## 2020-05-06 NOTE — Progress Notes (Signed)
Hematology/Oncology Follow up note Terry Mitchell [phone] [phone]   Patient Care Team: [name], MD as PCP - General (Family Medicine) [name], MD as Consulting Physician (Hematology and Oncology)  REASON FOR VISIT:  Follow up for prostate cancer.  HISTORY OF PRESENTING ILLNESS:  Terry Mitchell is a  69 y.o.  male with PMH listed below who was referred to me for evaluation of elevated PSA. Patient was referred by primary care physician to urology due to elevated PSA at the level of 72.  Per note Patient was referred to urology for further management.  Staging imaging has been ordered including abdominal and pelvis CT with contrast and bone scan.  Patient was also given Mills Koller for treatment of clinical prostate cancer.  Patient reports that he was scheduled to return to urologist office for biopsy. Patient denies any dysuria, hematuria, fever or chills.  He lives by himself. Denies any bone pain.  Reports some soreness at the area of [place] shots, as well as hot flushes.  Otherwise no complaints.  #01/01/2018 Patient received Firmagon loading dose at urologist office  # patient had a prostate biopsy on 02/28/2018 pathology showed prostate adenocarcinoma with androgen deprivation treatment effect.  Adenocarcinoma is present in all core fragments with involvement of 50% or more of each core.  Perineural invasion is present. As patient has received Firmagon prior to biopsy, Gleason grade cannot be reliably assessed after androgen deprivation therapy.  #  discussed with patient's urologist Dr. Bernardo Heater who did patient's prostate biopsy.  He agrees that given Mills Koller prior to biopsy and staging images are not typical.  He agrees that PET scan is consistent with lymph node involvement.  He does not think patient is a good candidate for radical prostatectomy.  He agrees with me about the plan that definitive radiation is a good option.  #  September 2019  external Beam radiation. He was scheduled to have I-125 interstitial implant although that can not been done so patient underwent external beam IMRT prostate boost, finished in January 2020.  # patient was on androgen deprivation therapy with Firmagon injections, until August 2020. Declined ADT due to vasomotor symptoms.   #Blood work from 04/14/2019 showed testosterone level of 284, PSA has been decreased to 24.18.  # 04/28/2019 CT chest abdomen pelvis and bone scan images were independently reviewed by me and discussed with patient. Images are consistent with metastatic prostate cancer.  # 09/24/2019 colonoscopy.  Descending colon polyp, resected.  Pathology showed tubular adenoma.  Negative for high-grade dysplasia or malignancy. # # 09/25/2019 Endoscopy  # 1023/2021- 10/05/2019  6 cycles of Docetaxel treatments # 01/06/20 US renal showed soft tissue lesion within the posterior wall of bladder suspicious for underlying mass.  I called patient at that time and he declined urology referral or additional work up and hang up.   #04/08/2020, patient kept his appointment and was seen by me.  Patient declined additional urology work-up or imaging work-up.  He agreed to try [facility]   INTERVAL HISTORY Terry Mitchell is a 69 y.o. male who has above history reviewed by me today presents for follow-up visit for management of Stage IVA (cTX, cN1, cM1) metastatic prostate cancer Patient tried Gillermina Phy shortly after last visit and reports not feeling well while he is taking and has been of [place] for 2 to 3 weeks.  Patient did not update me in 2 today.  He denies any new complaints.  Chronic fatigue.   Review  of Systems  Constitutional: Positive for fatigue. Negative for appetite change, chills, fever and unexpected weight change.  HENT:   Negative for hearing loss and voice change.   Eyes: Negative for eye problems and icterus.  Respiratory: Negative for chest tightness, cough and shortness of  breath.   Cardiovascular: Negative for chest pain and leg swelling.  Gastrointestinal: Negative for abdominal distention, abdominal pain and blood in stool.  Endocrine: Positive for hot flashes.  Genitourinary: Negative for difficulty urinating, dysuria and frequency.   Musculoskeletal: Negative for arthralgias.  Skin: Negative for itching and rash.  Neurological: Negative for extremity weakness, light-headedness and numbness.  Hematological: Negative for adenopathy. Does not bruise/bleed easily.  Psychiatric/Behavioral: Negative for confusion.     MEDICAL HISTORY:  Past Medical History:  Diagnosis Date  . Depression    recently went on disability d/t diagnosis  . History of recent fall 01/2018   missed the last step on ladder, causing back discomfort  . Hypertension   . Prostate cancer ([place]) 12/2017   prostate    SURGICAL HISTORY: Past Surgical History:  Procedure Laterality Date  . BACK SURGERY  2001    2 rods and 6 screws in back  . COLONOSCOPY WITH PROPOFOL N/A 09/24/2019   Procedure: COLONOSCOPY WITH PROPOFOL;  Surgeon: Jonathon Bellows, MD;  Location: [facility] END[facility]COPY;  Service: Gastroenterology;  Laterality: N/A;  . Left shoulder surgery Left 1998   removed a piece of bone around collar bone  . PROSTATE BIOPSY N/A 02/28/2018   Procedure: PROSTATE BIOPSY;  Surgeon: Abbie Sons, MD;  Location: [facility] ORS;  Service: Urology;  Laterality: N/A;  . TRANSRECTAL ULTRASOUND N/A 02/28/2018   Procedure: TRANSRECTAL ULTRASOUND;  Surgeon: Abbie Sons, MD;  Location: [facility] ORS;  Service: Urology;  Laterality: N/A;    SOCIAL HISTORY: Social History   Socioeconomic History  . Marital status: Single    Spouse name: Not on file  . Number of children: 4  . Years of education: Not on file  . Highest education level: Not on file  Occupational History  . Occupation: disability  Tobacco Use  . Smoking status: Former Smoker    Packs/day: 2.00    Types: Cigars    Quit date: 07/24/2019     Years since quitting: 0.7  . Smokeless tobacco: Never Used  . Tobacco comment: 2 cigars  Vaping Use  . Vaping Use: Never used  Substance and Sexual Activity  . Alcohol use: Yes    Alcohol/week: 9.0 standard drinks    Types: 9 Cans of beer per week    Comment: occasional  . Drug use: Yes    Types: Cocaine, "Crack" cocaine    Comment: none within the year  . Sexual activity: Not Currently  Other Topics Concern  . Not on file  Social History Narrative  . Not on file   Social Determinants of Health   Financial Resource Strain:   . Difficulty of Paying Living Expenses: Not on file  Food Insecurity:   . Worried About Charity fundraiser in the Last Year: Not on file  . Ran Out of Food in the Last Year: Not on file  Transportation Needs:   . Lack of Transportation (Medical): Not on file  . Lack of Transportation (Non-Medical): Not on file  Physical Activity:   . Days of Exercise per Week: Not on file  . Minutes of Exercise per Session: Not on file  Stress:   . Feeling of Stress : Not on file  Social Connections:   . Frequency of Communication with Friends and Family: Not on file  . Frequency of Social Gatherings with Friends and Family: Not on file  . Attends Religious Services: Not on file  . Active Member of Clubs or Organizations: Not on file  . Attends Archivist Meetings: Not on file  . Marital Status: Not on file  Intimate Partner Violence:   . Fear of Current or Ex-Partner: Not on file  . Emotionally Abused: Not on file  . Physically Abused: Not on file  . Sexually Abused: Not on file    FAMILY HISTORY: Family History  Problem Relation Age of Onset  . Stroke Mother   . Kidney failure Mother   . Diabetes Maternal Aunt     ALLERGIES:  is allergic to penicillins and lactose intolerance (gi).  MEDICATIONS:  Current Outpatient Medications  Medication Sig Dispense Refill  . albuterol (VENTOLIN HFA) 108 (90 Base) MCG/ACT inhaler Inhale 2 puffs into the  lungs every 6 (six) hours as needed for wheezing or shortness of breath. 6.7 g 0  . amLODipine (NORVASC) 5 MG tablet Take 1 tablet (5 mg total) by mouth daily. 30 tablet 0  . atorvastatin (LIPITOR) 40 MG tablet     . losartan (COZAAR) 50 MG tablet Take 50 mg by mouth daily after breakfast.   2  . vitamin B-12 (CYANOCOBALAMIN) 1000 MCG tablet Take 1 tablet (1,000 mcg total) by mouth daily. 90 tablet 1  . enzalutamide (XTANDI) 40 MG tablet Take 4 tablets (160 mg total) by mouth daily. (Patient not taking: Reported on 05/05/2020) 120 tablet 0  . loratadine (CLARITIN) 10 MG tablet Take 10 mg by mouth daily. In prep for fulphila (Patient not taking: Reported on 04/06/2020)     No current facility-administered medications for this visit.   Facility-Administered Medications Ordered in Other Visits  Medication Dose Route Frequency Provider Last Rate Last Admin  . sodium chloride flush (NS) 0.9 % injection 10 mL  10 mL Intravenous Once [name], MD         PHYSICAL EXAMINATION: ECOG PERFORMANCE STATUS: 1 - Symptomatic but completely ambulatory Vitals:   05/06/20 1402  BP: 115/70  Pulse: 62  Resp: 18  Temp: 97.6 F (36.4 C)  SpO2: 100%   Filed Weights   05/06/20 1402  Weight: 197 lb (89.4 kg)    Physical Exam Constitutional:      General: He is not in acute distress. HENT:     Head: Normocephalic and atraumatic.  Eyes:     General: No scleral icterus.    Conjunctiva/sclera: Conjunctivae normal.     Pupils: Pupils are equal, round, and reactive to light.  Cardiovascular:     Rate and Rhythm: Normal rate and regular rhythm.     Heart sounds: Normal heart sounds.  Pulmonary:     Effort: Pulmonary effort is normal. No respiratory distress.     Breath sounds: No wheezing.  Abdominal:     General: Bowel sounds are normal. There is no distension.     Palpations: Abdomen is soft. There is no mass.     Tenderness: There is no abdominal tenderness.  Musculoskeletal:        General: No  deformity. Normal range of motion.     Cervical back: Normal range of motion and neck supple.  Lymphadenopathy:     Cervical: No cervical adenopathy.  Skin:    General: Skin is warm and dry.     Findings: No  erythema or rash.  Neurological:     General: No focal deficit present.     Mental Status: He is alert and oriented to person, place, and time. Mental status is at baseline.     Cranial Nerves: No cranial nerve deficit.     Coordination: Coordination normal.  Psychiatric:        Mood and Affect: Mood normal.      LABORATORY DATA:  I have reviewed the data as listed Lab Results  Component Value Date   WBC 4.5 05/06/2020   HGB 11.0 (L) 05/06/2020   HCT 33.1 (L) 05/06/2020   MCV 108.2 (H) 05/06/2020   PLT 154 05/06/2020   Recent Labs    11/03/19 1002 11/03/19 1002 01/05/20 1252 04/06/20 0905 05/06/20 1325  NA 141   < > 142 140 141  K 3.6   < > 3.8 4.2 4.3  CL 103   < > 108 105 105  CO2 24   < > 23 23 24   GLUCOSE 93   < > 100* 106* 116*  BUN 27*   < > 35* 38* 45*  CREATININE 1.38*   < > 1.96* 1.63* 2.32*  CALCIUM 8.9   < > 9.6 9.5 9.4  GFRNONAA 53*   < > 34* 43* 28*  GFRAA >60  --  40* 50*  --   PROT 6.8   < > 7.3 7.3 6.9  ALBUMIN 4.0   < > 4.4 4.4 4.1  AST 33   < > 41 46* 33  ALT 19   < > 21 23 20   ALKPHOS 94   < > 84 82 86  BILITOT 0.9   < > 0.9 0.9 0.7   < > = values in this interval not displayed.   Iron/TIBC/Ferritin/ %Sat No results found for: IRON, TIBC, FERRITIN, IRONPCTSAT   RADIOGRAPHIC STUDIES: I have personally reviewed the radiological images as listed and agreed with the findings in the report. No results found.   ASSESSMENT & PLAN:  1. Prostate cancer ([place])   2. Goals of care, counseling/discussion   Cancer Staging Prostate cancer [facility]) Staging form: Prostate, AJCC 8th Edition - Clinical stage from 03/19/2018: Stage IVB (cTX, cN1, cM1b) - Signed by [name], MD on 05/01/2019  #Metastatic prostate Cancer, previously castration  sensitive, s/p upfront docetaxel x 6.  PSA 24.18--> 33.15-->52.75-->13.98--> 7.7--> 5.51-->5.58-->6.02-->4.35-->5.58-->11.92-->14.5 Labs reviewed and discussed with patient.  He reports not able to tolerate Xtandi. Goals of care was discussed.  Patient understands that his condition is not curable but potentially treatable at this point. Discussed with patient about other options and he declined.  Worsening of kidney function, patient declined urology work-up or additional images at this point. He does not want any additional treatments or testing. I offered him to refer to palliative care service and he declined.patient does not want to continue follow-up and walked out of our clinic. Cc Adamo, Hattie Perch, MD  [name], MD, PhD Hematology Oncology [facility] at [facility] Pager- [phone] 05/06/2020

## 2020-09-06 ENCOUNTER — Emergency Department: Payer: Medicare HMO

## 2020-09-06 ENCOUNTER — Other Ambulatory Visit: Payer: Self-pay

## 2020-09-06 ENCOUNTER — Emergency Department
Admission: EM | Admit: 2020-09-06 | Discharge: 2020-09-06 | Disposition: A | Discharge status: Home / Self Care | Payer: Medicare HMO | Attending: Emergency Medicine | Admitting: Emergency Medicine

## 2020-09-06 DIAGNOSIS — R059 Cough, unspecified: Secondary | ICD-10-CM | POA: Insufficient documentation

## 2020-09-06 DIAGNOSIS — M549 Dorsalgia, unspecified: Secondary | ICD-10-CM | POA: Diagnosis not present

## 2020-09-06 DIAGNOSIS — Z8546 Personal history of malignant neoplasm of prostate: Secondary | ICD-10-CM | POA: Insufficient documentation

## 2020-09-06 DIAGNOSIS — Z85830 Personal history of malignant neoplasm of bone: Secondary | ICD-10-CM | POA: Insufficient documentation

## 2020-09-06 DIAGNOSIS — Z79899 Other long term (current) drug therapy: Secondary | ICD-10-CM | POA: Insufficient documentation

## 2020-09-06 DIAGNOSIS — Z87891 Personal history of nicotine dependence: Secondary | ICD-10-CM | POA: Diagnosis not present

## 2020-09-06 DIAGNOSIS — N1832 Chronic kidney disease, stage 3b: Secondary | ICD-10-CM | POA: Insufficient documentation

## 2020-09-06 DIAGNOSIS — I129 Hypertensive chronic kidney disease with stage 1 through stage 4 chronic kidney disease, or unspecified chronic kidney disease: Secondary | ICD-10-CM | POA: Insufficient documentation

## 2020-09-06 DIAGNOSIS — R079 Chest pain, unspecified: Secondary | ICD-10-CM | POA: Diagnosis present

## 2020-09-06 LAB — CBC
HCT: 35.2 % — ABNORMAL LOW (ref 39.0–52.0)
Hemoglobin: 11.7 g/dL — ABNORMAL LOW (ref 13.0–17.0)
MCH: 34.4 pg — ABNORMAL HIGH (ref 26.0–34.0)
MCHC: 33.2 g/dL (ref 30.0–36.0)
MCV: 103.5 fL — ABNORMAL HIGH (ref 80.0–100.0)
Platelets: 118 10*3/uL — ABNORMAL LOW (ref 150–400)
RBC: 3.4 MIL/uL — ABNORMAL LOW (ref 4.22–5.81)
RDW: 12.6 % (ref 11.5–15.5)
WBC: 7 10*3/uL (ref 4.0–10.5)
nRBC: 0 % (ref 0.0–0.2)

## 2020-09-06 LAB — BASIC METABOLIC PANEL
Anion gap: 12 (ref 5–15)
BUN: 23 mg/dL (ref 8–23)
CO2: 24 mmol/L (ref 22–32)
Calcium: 9.3 mg/dL (ref 8.9–10.3)
Chloride: 101 mmol/L (ref 98–111)
Creatinine, Ser: 1.42 mg/dL — ABNORMAL HIGH (ref 0.61–1.24)
GFR, Estimated: 54 mL/min — ABNORMAL LOW (ref >60)
Glucose, Bld: 100 mg/dL — ABNORMAL HIGH (ref 70–99)
Potassium: 3.8 mmol/L (ref 3.5–5.1)
Sodium: 137 mmol/L (ref 135–145)

## 2020-09-06 LAB — TROPONIN I (HIGH SENSITIVITY)
Troponin I (High Sensitivity): 6 ng/L (ref <18)
Troponin I (High Sensitivity): 6 ng/L (ref <18)

## 2020-09-06 IMAGING — CT CT ANGIO CHEST
2 of 4 series · 18 of 46 positions shown · IV contrast (APPLIED)
Comparison: CT [DATE]

CLINICAL DATA: Lower right chest pain.  History of prostate cancer

EXAM:
CT ANGIOGRAPHY CHEST WITH CONTRAST
TECHNIQUE: Multidetector CT imaging of the chest was performed using the
standard protocol during bolus administration of intravenous
contrast. Multiplanar CT image reconstructions and MIPs were
obtained to evaluate the vascular anatomy.
CONTRAST:  75mL OMNIPAQUE IOHEXOL 350 MG/ML SOLN

[Series 6: thins · axial · 0.65mm/px · z∈[-269,-16]mm · 15 of 282 slices shown]
[im 19/282  lung]
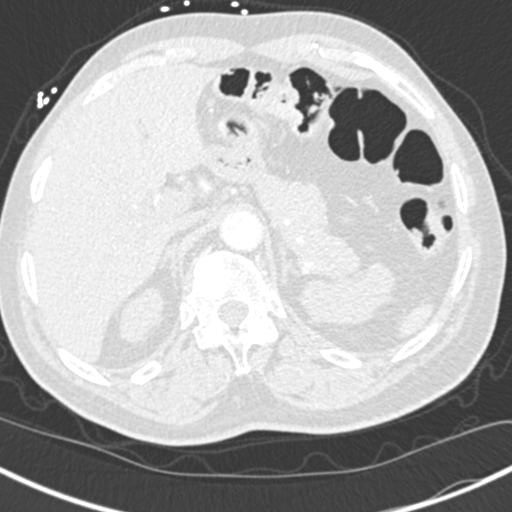
[im 37/282  soft-tissue]
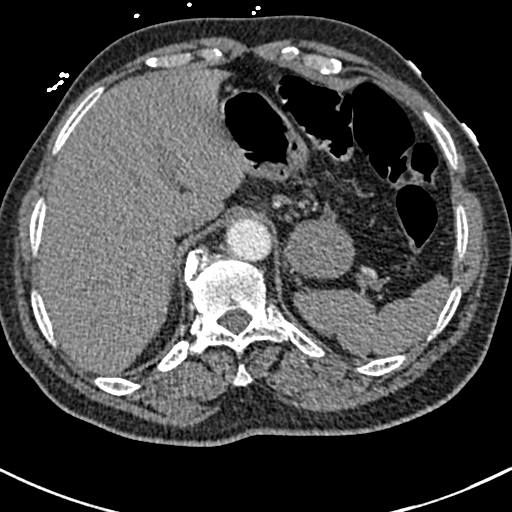
[im 55/282  lung]
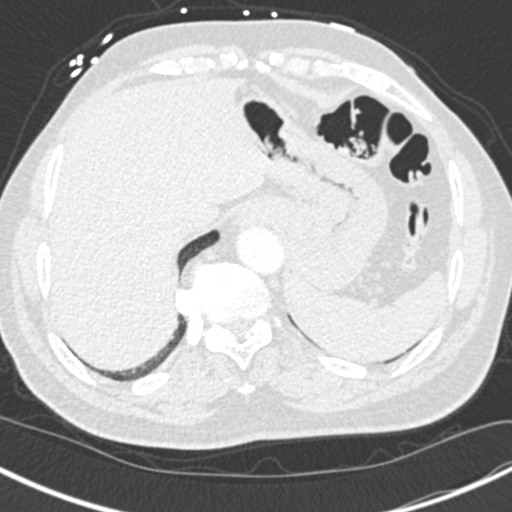
[im 73/282  soft-tissue]
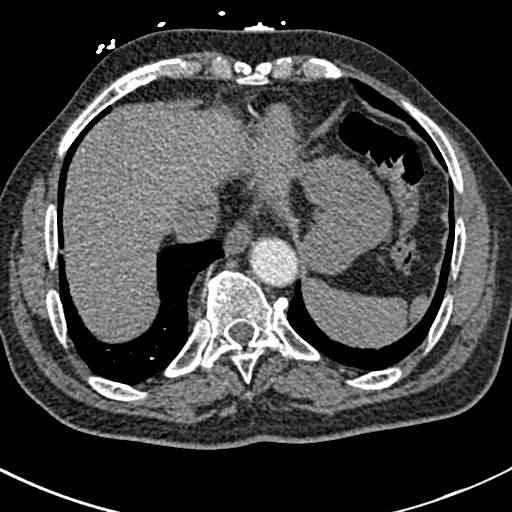
[im 91/282  lung]
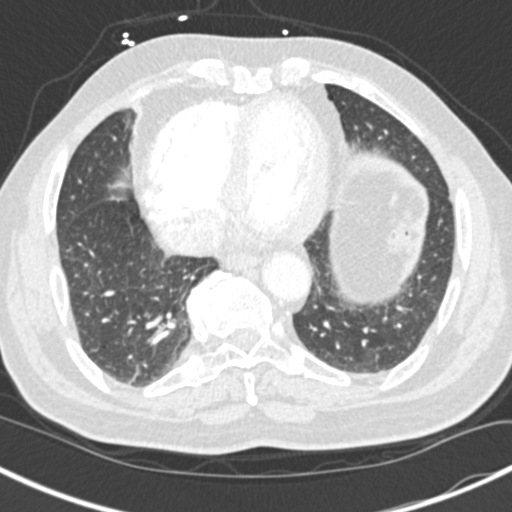
[im 109/282  soft-tissue]
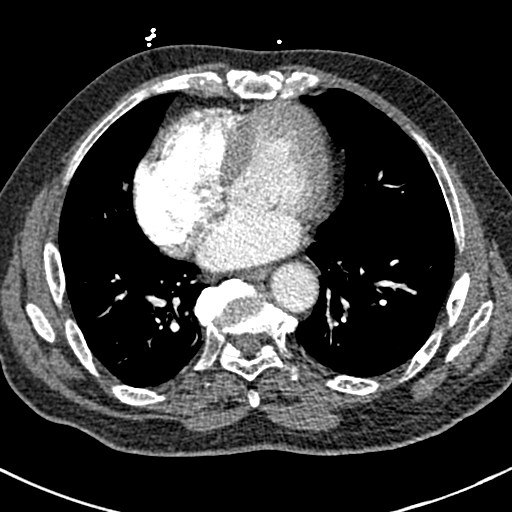
[im 127/282  lung]
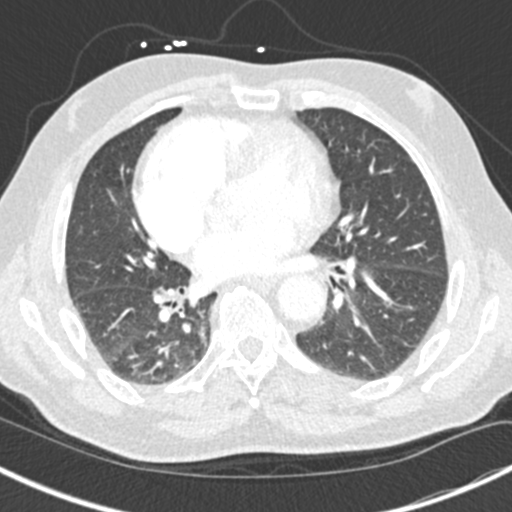
[im 146/282  soft-tissue]
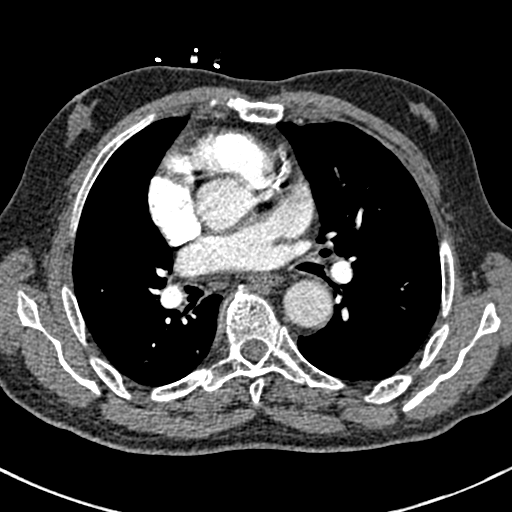
[im 164/282  lung]
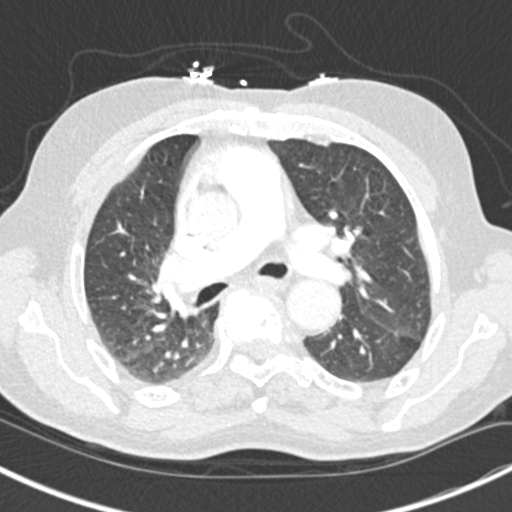
[im 182/282  soft-tissue]
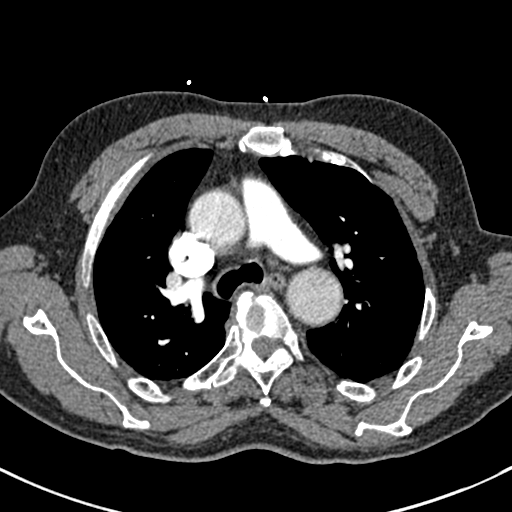
[im 200/282  lung]
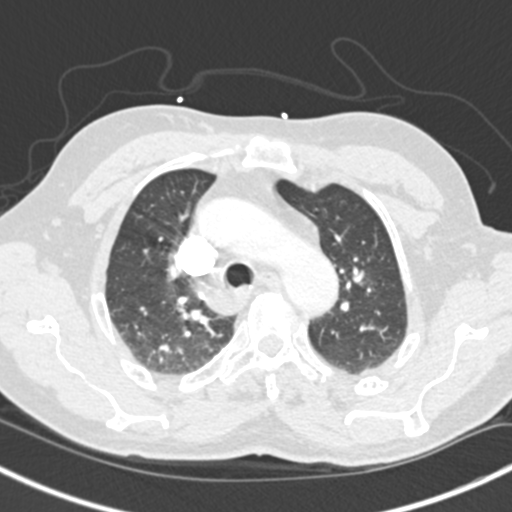
[im 218/282  soft-tissue]
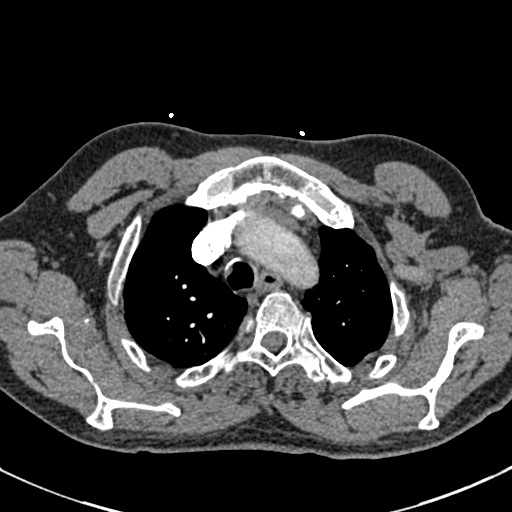
[im 236/282  lung]
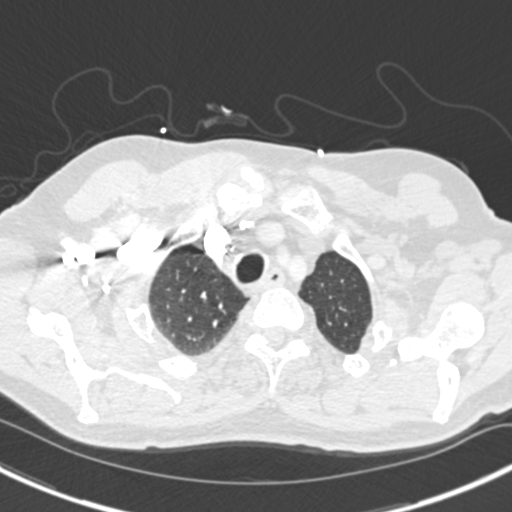
[im 254/282  soft-tissue]
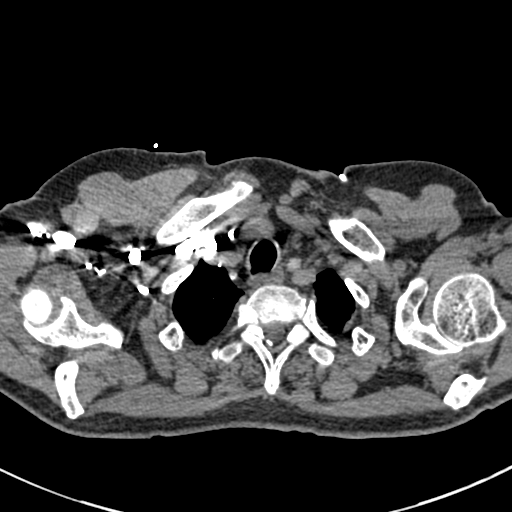
[im 272/282  lung]
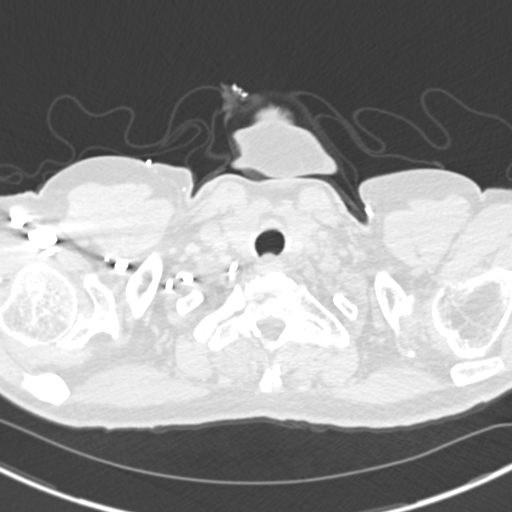

[Series 8: coronal mpr · coronal · 0.55mm/px · 3 of 99 slices shown]
[im 33/99  soft-tissue]
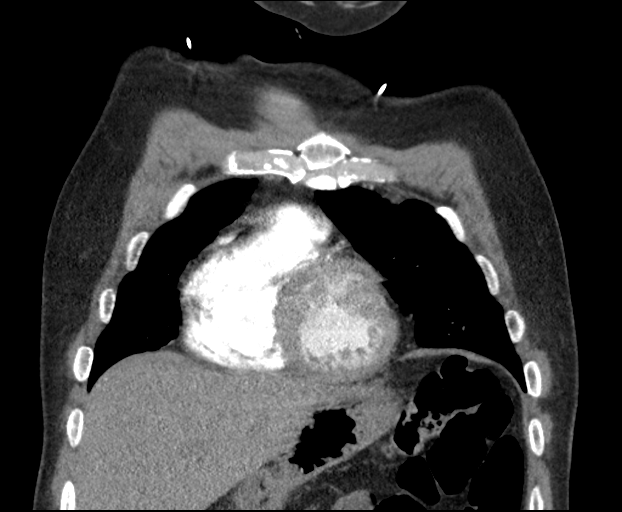
[im 44/99  soft-tissue]
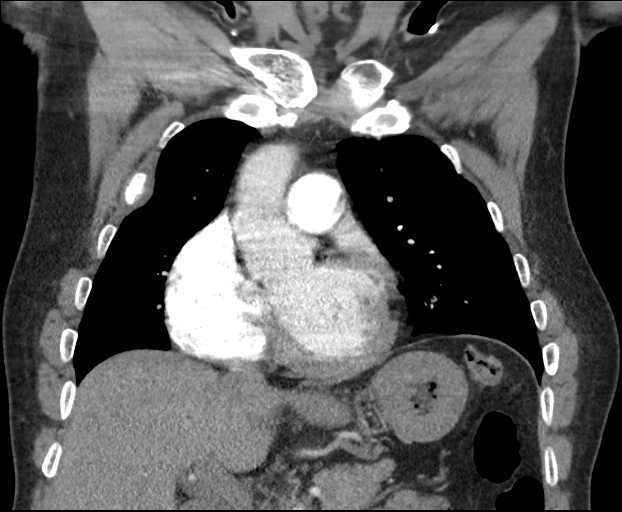
[im 55/99  soft-tissue]
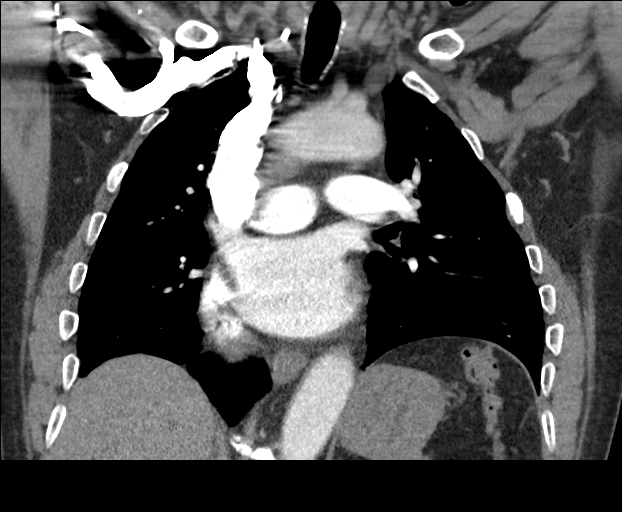

[18 of 46 positions shown; findings below may reference images not displayed]

FINDINGS: Cardiovascular: Satisfactory opacification of the pulmonary arteries
to the segmental level. No evidence of pulmonary embolism. Thoracic
aorta is nonaneurysmal. Scattered atherosclerotic calcification of
the aorta and coronary arteries. Normal heart size. No pericardial
effusion.

Mediastinum/Nodes: No axillary, mediastinal, or hilar
lymphadenopathy. Thyroid, trachea, and esophagus within normal
limits.

Lungs/Pleura: Mild dependent subsegmental atelectasis within the
right lung. No focal airspace consolidation. No pleural effusion or
pneumothorax. Pleural thickening adjacent to permeative metastatic
lesion at the anterolateral aspect of the right third rib and
lateral left third rib.

Upper Abdomen: No acute findings.

Musculoskeletal: Large mixed lytic and sclerotic lesion within the
T6 vertebral body, enlarged compared to prior CT enlarging sclerotic
lesions within the anterior aspects of the T11 and T12 vertebral
bodies (series 10, images 47-48). Numerous mixed lytic and sclerotic
rib lesions including posterior right third rib and anterolateral
right third rib, lateral left third rib, and the posterior left
seventh, eighth, and ninth ribs. Rib lesions have increased in size
compared to prior CT. No pathologic fracture. Bilateral
gynecomastia.

Review of the MIP images confirms the above findings.
IMPRESSION: 1. No evidence of pulmonary embolism.
2. Interval enlargement of mixed sclerotic and lytic osseous
metastatic lesions including the T6, T11, and T12 vertebral bodies.
Enlarging bilateral rib lesions including right third rib and left
third, seventh, eighth, and ninth ribs.

Aortic Atherosclerosis ([K5]-[K5]).

## 2020-09-06 IMAGING — CR DG CHEST 2V
1 series · 2 of 2 positions shown · non-contrast
Comparison: [DATE]

CLINICAL DATA: Shortness of breath, mid back pain radiating into
chest for 3 days, history metastatic prostate cancer

EXAM:
CHEST - 2 VIEW

[Series 1: w chest pa · 0.14mm/px · 2 of 2 slices shown]
[im 1/2]
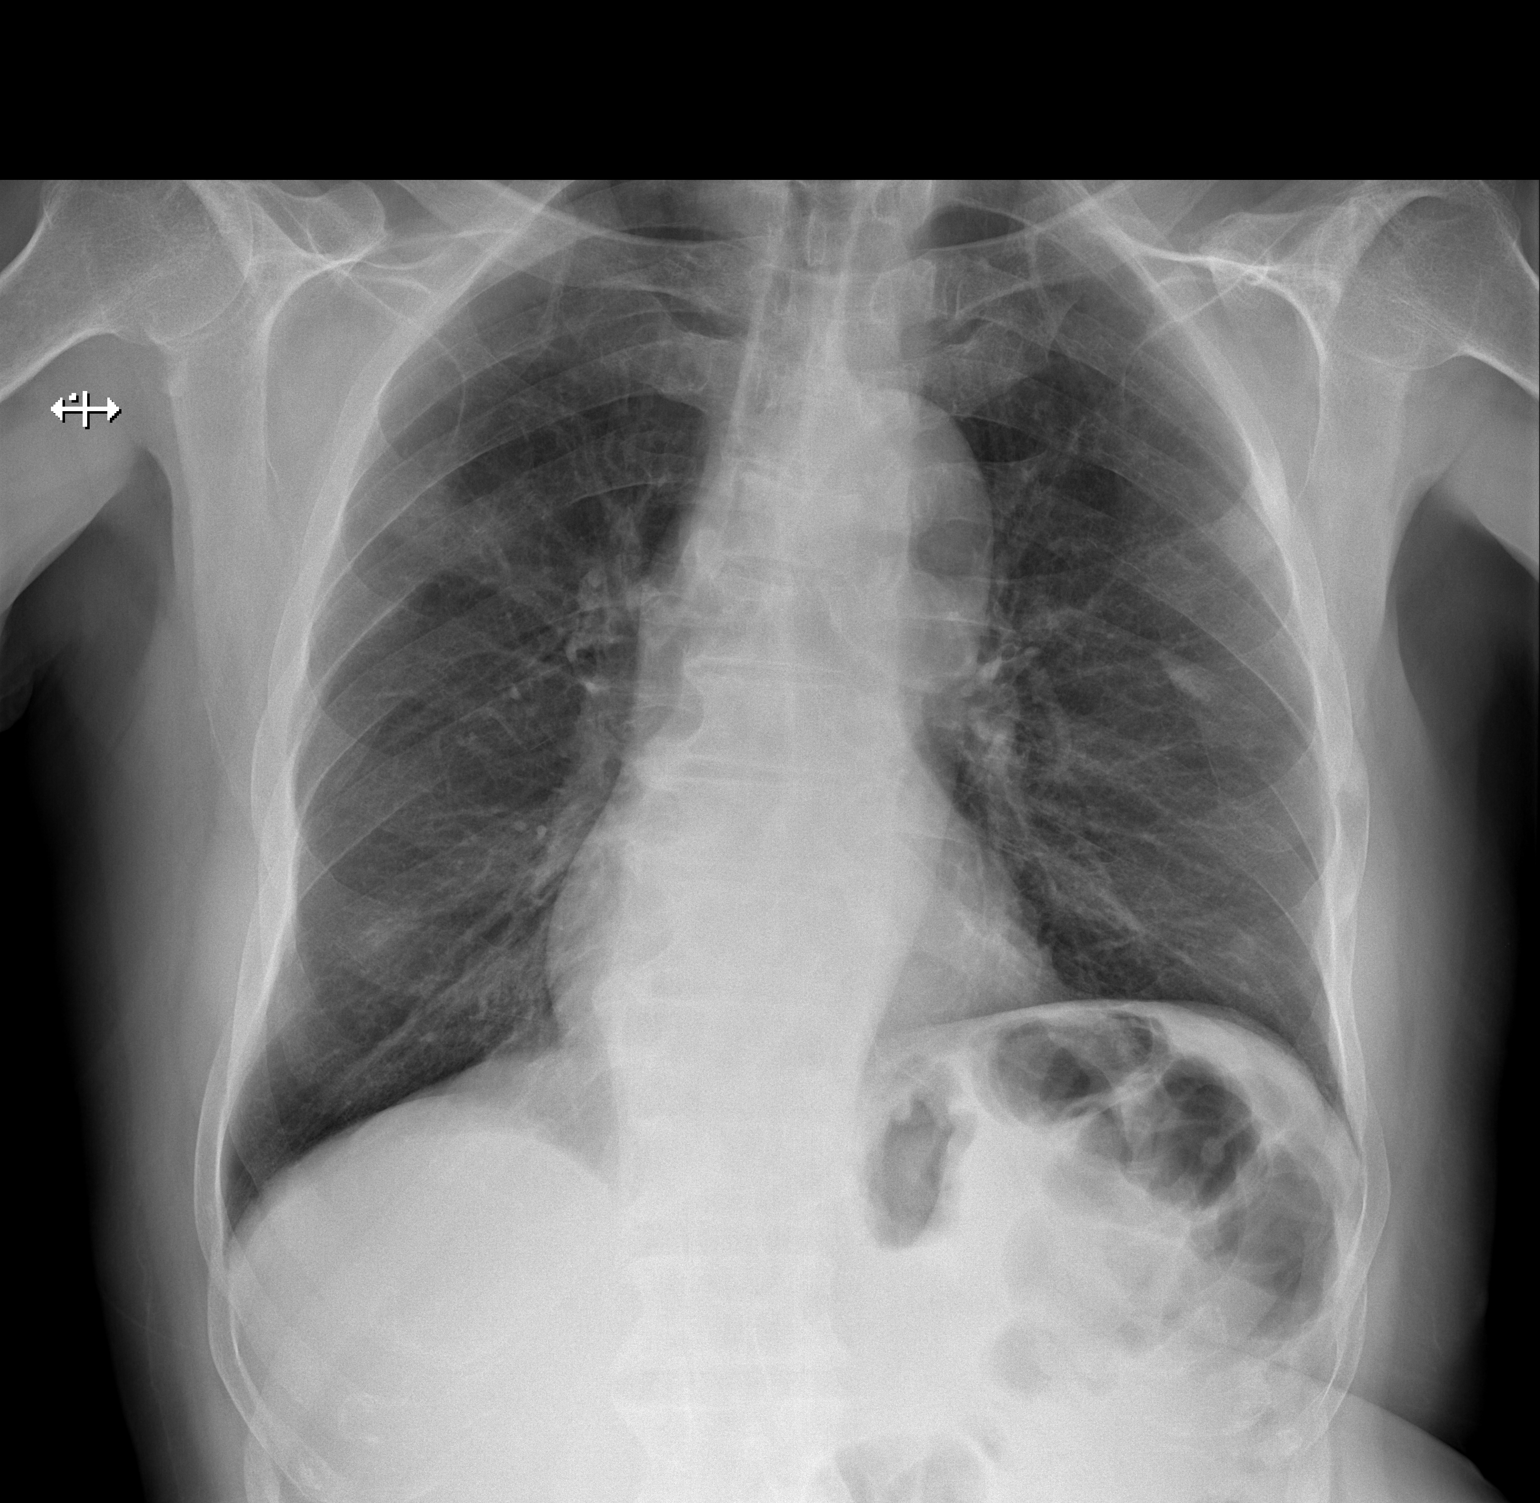
[im 2/2]
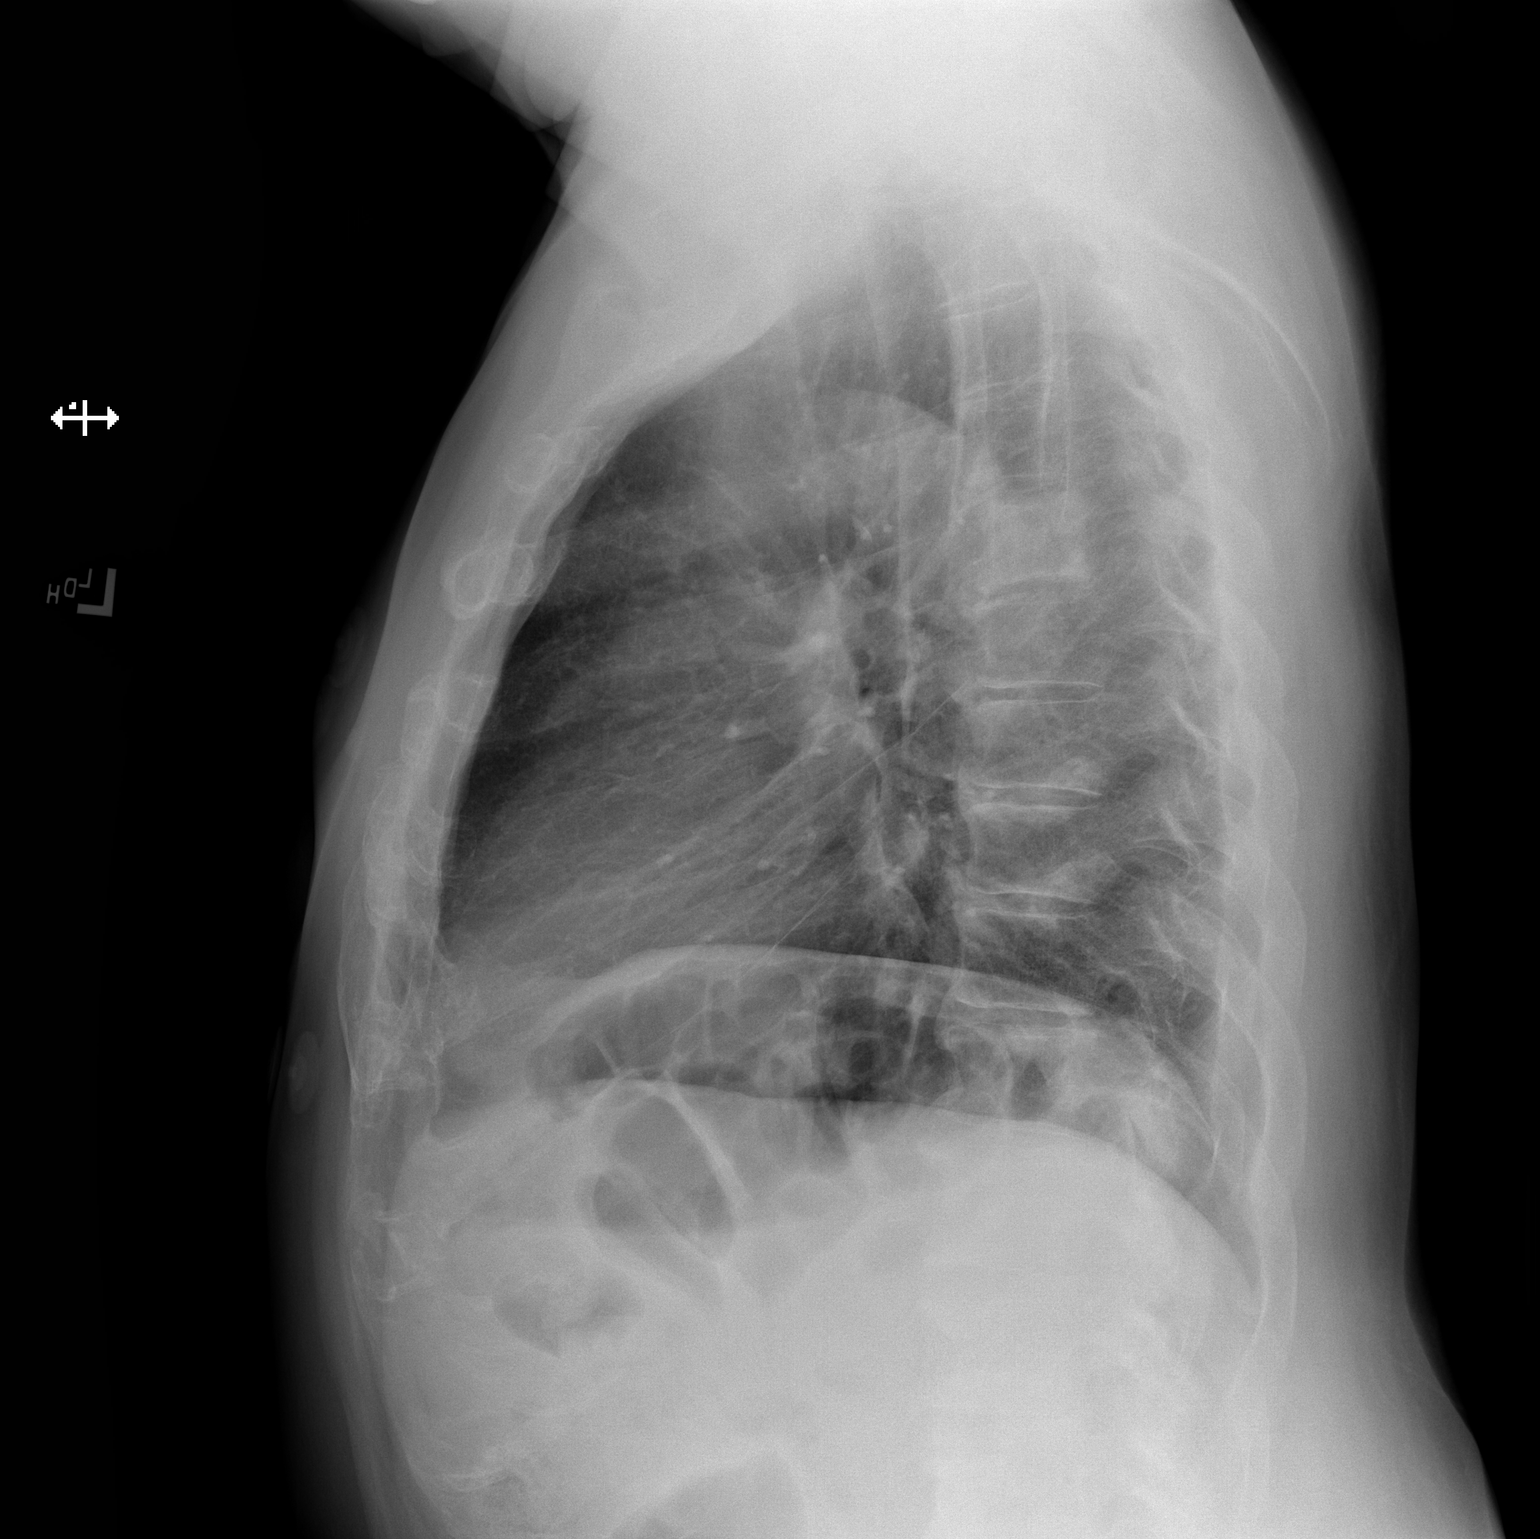

[2 of 2 positions shown; findings below may reference images not displayed]

FINDINGS: Normal heart size, mediastinal contours, and pulmonary vascularity.

Lungs clear.

No infiltrate, pleural effusion, or pneumothorax.

Chronic deformity of distal LEFT clavicle.

Sclerotic focus at approximately T6 vertebral body, posterior LEFT
seventh rib, anterior RIGHT third rib, as well as at the lateral
LEFT sixth rib suspicious for osseous metastases.

Degenerative disc disease changes and dextroconvex scoliosis of
lower thoracic spine.
IMPRESSION: No acute pulmonary abnormalities.

Suspected osseous metastases.

## 2020-09-06 MED ORDER — FENTANYL CITRATE (PF) 100 MCG/2ML IJ SOLN
50.0000 ug | Freq: Once | INTRAMUSCULAR | Status: AC
  Administered 2020-09-06: 50 ug via INTRAVENOUS
  Filled 2020-09-06: qty 2

## 2020-09-06 MED ORDER — IOHEXOL 350 MG/ML SOLN
75.0000 mL | Freq: Once | INTRAVENOUS | Status: AC | PRN
  Administered 2020-09-06: 75 mL via INTRAVENOUS

## 2020-09-06 MED ORDER — OXYCODONE-ACETAMINOPHEN 5-325 MG PO TABS: 1.0000 | ORAL_TABLET | Freq: Four times a day (QID) | ORAL | 0 refills | Status: DC | PRN

## 2020-09-06 NOTE — ED Triage Notes (Signed)
Pt comes pov with middle back pain radiating into chest for about 3 days. Has metastases from prostate cancer to bone, bladder, unsure where else. Denies current treatment says "i'm scared because they find something new every time I come". Also has some increased SOB with this pain.

## 2020-09-06 NOTE — ED Provider Notes (Signed)
[facility] Emergency Department Provider Note  ____________________________________________   I have reviewed the triage vital signs and the nursing notes.   HISTORY  Chief Complaint Chest Pain and Back Pain   History limited by: Not Limited   HPI Terry Mitchell is a 70 y.o. male who presents to the emergency department today because of concern for chest and back pain. The pain started three days ago. He describes it as being located in his lower chest, bilaterally however worse on the right side. It does radiate around to his right mid back. The patient has had some associated painful cough. The patient does have history of cancer. Denies any leg swelling.    Records reviewed. Per medical record review patient has a history of prostate cancer.   Past Medical History:  Diagnosis Date  . Depression    recently went on disability d/t diagnosis  . History of recent fall 01/2018   missed the last step on ladder, causing back discomfort  . Hypertension   . Prostate cancer ([place]) 12/2017   prostate    Patient Active Problem List   Diagnosis Date Noted  . Stage 3b chronic kidney disease ([place]) 04/06/2020  . Encounter for antineoplastic chemotherapy 04/06/2020  . Hot flash in male 07/27/2019  . Prostate cancer metastatic to bone ([place]) 05/15/2019  . Prostate cancer ([place]) 03/19/2018  . Androgen deprivation therapy 03/19/2018  . PSA elevation 03/19/2018  . Goals of care, counseling/discussion 03/19/2018  . Chest pain 02/20/2018    Past Surgical History:  Procedure Laterality Date  . BACK SURGERY  2001    2 rods and 6 screws in back  . COLONOSCOPY WITH PROPOFOL N/A 09/24/2019   Procedure: COLONOSCOPY WITH PROPOFOL;  Surgeon: Jonathon Bellows, MD;  Location: [facility] ENDOSCOPY;  Service: Gastroenterology;  Laterality: N/A;  . Left shoulder surgery Left 1998   removed a piece of bone around collar bone  . PROSTATE BIOPSY N/A 02/28/2018   Procedure: PROSTATE BIOPSY;   Surgeon: Abbie Sons, MD;  Location: [facility] ORS;  Service: Urology;  Laterality: N/A;  . TRANSRECTAL ULTRASOUND N/A 02/28/2018   Procedure: TRANSRECTAL ULTRASOUND;  Surgeon: Abbie Sons, MD;  Location: [facility] ORS;  Service: Urology;  Laterality: N/A;    Prior to Admission medications   Medication Sig Start Date End Date Taking? Authorizing Provider  albuterol (VENTOLIN HFA) 108 (90 Base) MCG/ACT inhaler Inhale 2 puffs into the lungs every 6 (six) hours as needed for wheezing or shortness of breath. 05/22/19   [name], MD  amLODipine (NORVASC) 5 MG tablet Take 1 tablet (5 mg total) by mouth daily. 06/26/19   [name], MD  atorvastatin (LIPITOR) 40 MG tablet  03/23/20   [provider]  enzalutamide Gillermina Phy) 40 MG tablet Take 4 tablets (160 mg total) by mouth daily. Patient not taking: Reported on 05/05/2020 04/13/20   [name], MD  loratadine (CLARITIN) 10 MG tablet Take 10 mg by mouth daily. In prep for fulphila Patient not taking: Reported on 04/06/2020    [provider]  losartan (COZAAR) 50 MG tablet Take 50 mg by mouth daily after breakfast.  03/06/18   [provider]  vitamin B-12 (CYANOCOBALAMIN) 1000 MCG tablet Take 1 tablet (1,000 mcg total) by mouth daily. 05/15/19   [name], MD  prochlorperazine (COMPAZINE) 10 MG tablet Take 1 tablet (10 mg total) by mouth every 6 (six) hours as needed (Nausea or vomiting). Patient not taking: Reported on 11/02/2019 05/01/19 04/08/20  Tasia Catchings,  Talbert Cage, MD    Allergies Penicillins and Lactose intolerance (gi)  Family History  Problem Relation Age of Onset  . Stroke Mother   . Kidney failure Mother   . Diabetes Maternal Aunt     Social History Social History   Tobacco Use  . Smoking status: Former Smoker    Packs/day: 2.00    Types: Cigars    Quit date: 07/24/2019    Years since quitting: 1.1  . Smokeless tobacco: Never Used  . Tobacco comment: 2 cigars  Vaping Use  . Vaping Use: Never used  Substance Use Topics  .  Alcohol use: Yes    Alcohol/week: 9.0 standard drinks    Types: 9 Cans of beer per week    Comment: occasional  . Drug use: Yes    Types: Cocaine, "Crack" cocaine    Comment: none within the year    Review of Systems Constitutional: No fever/chills Eyes: No visual changes. ENT: No sore throat. Cardiovascular: Positive for chest pain. Respiratory: Denies shortness of breath. Positive for cough. Gastrointestinal: No abdominal pain.  No nausea, no vomiting.  No diarrhea.   Genitourinary: Negative for dysuria. Musculoskeletal: Positive for back pain. Skin: Negative for rash. Neurological: Negative for headaches, focal weakness or numbness.  ____________________________________________   PHYSICAL EXAM:  VITAL SIGNS: ED Triage Vitals  Enc Vitals Group     BP 09/06/20 0953 (!) 152/91     Pulse Rate 09/06/20 0953 69     Resp 09/06/20 0953 18     Temp 09/06/20 0953 98.7 F (37.1 C)     Temp Source 09/06/20 0953 Oral     SpO2 09/06/20 0953 100 %     Weight 09/06/20 0954 202 lb (91.6 kg)     Height 09/06/20 0954 6' (1.829 m)     Head Circumference --      Peak Flow --      Pain Score 09/06/20 0954 8   Constitutional: Alert and oriented.  Eyes: Conjunctivae are normal.  ENT      Head: Normocephalic and atraumatic.      Nose: No congestion/rhinnorhea.      Mouth/Throat: Mucous membranes are moist.      Neck: No stridor. Hematological/Lymphatic/Immunilogical: No cervical lymphadenopathy. Cardiovascular: Normal rate, regular rhythm.  No murmurs, rubs, or gallops.  Respiratory: Normal respiratory effort without tachypnea nor retractions. Breath sounds are clear and equal bilaterally. No wheezes/rales/rhonchi. Gastrointestinal: Soft and non tender. No rebound. No guarding.  Genitourinary: Deferred Musculoskeletal: Normal range of motion in all extremities. No lower extremity edema. Neurologic:  Normal speech and language. No gross focal neurologic deficits are appreciated.   Skin:  Skin is warm, dry and intact. No rash noted. Psychiatric: Mood and affect are normal. Speech and behavior are normal. Patient exhibits appropriate insight and judgment.  ____________________________________________    LABS (pertinent positives/negatives)  Trop hs 6 BMP wnl except glu 100, cr 1.42 CBC wbc 7.0, hgb 11.7, plt 118  ____________________________________________   EKG  I, Nance Pear, attending physician, personally viewed and interpreted this EKG  EKG Time: 0942 Rate: 69 Rhythm: sinus rhythm Axis: normal Intervals: qtc 432 QRS: narrow ST changes: no st elevation Impression: st depression v2  ____________________________________________    RADIOLOGY  CXR No acute abnormality. Osseous metastatic disease  ____________________________________________   PROCEDURES  Procedures  ____________________________________________   INITIAL IMPRESSION / ASSESSMENT AND PLAN / ED COURSE  Pertinent labs & imaging results that were available during my care of the patient were reviewed by  me and considered in my medical decision making (see chart for details).   Patient presents to the emergency department because of concern for chest pain that radiates to his back. Patient has history of cancer. At this time think likely pain is related to metastatic disease, however given cancer and cough will get ct angio to evaluate for possible PE. If negative do think patient can be discharged home with pain medication  ____________________________________________   FINAL CLINICAL IMPRESSION(S) / ED DIAGNOSES  Final diagnoses:  Chest pain, unspecified type     Note: This dictation was prepared with Dragon dictation. Any transcriptional errors that result from this process are unintentional     Nance Pear, MD 09/06/20 1510

## 2020-09-06 NOTE — ED Notes (Signed)
Resumed care from sam rn.  Pt alert, watching tv.  Pt has pain in upper abd/chest

## 2020-09-06 NOTE — Discharge Instructions (Addendum)
Please seek medical attention for any high fevers, chest pain, shortness of breath, change in behavior, persistent vomiting, bloody stool or any other new or concerning symptoms.  

## 2020-09-06 NOTE — ED Notes (Signed)
Pt signed esignature.  D/c inst to pt.  Iv dc'ed.   

## 2020-09-06 NOTE — ED Notes (Signed)
Patient transported to CT 

## 2020-09-07 ENCOUNTER — Telehealth: Payer: Self-pay | Admitting: Oncology

## 2020-09-07 NOTE — Telephone Encounter (Signed)
VM left to schedule f/u appt with Dr. Tasia Catchings. Patient was recently in the Emergency Department.

## 2020-09-08 NOTE — Telephone Encounter (Signed)
Patient last see in [place] and per office visit note on 10/15: He does not want any additional treatments or testing.I offered him to refer to palliative care service and he declined.patient does not want to continue follow-up and walked out of our clinic. Please advise.

## 2020-09-09 ENCOUNTER — Telehealth: Payer: Self-pay | Admitting: Oncology

## 2020-09-09 NOTE — Telephone Encounter (Signed)
Terry Mitchell, Dr. Tasia Catchings would like for you to see patient.  OK to schedule?

## 2020-09-09 NOTE — Telephone Encounter (Signed)
Patient last see in [place] and per office visit note on 10/15: He does not want any additional treatments or testing.I offered him to refer to palliative care service and he declined.patient does not want to continue follow-up and walked out of our clinic. Please advise.

## 2020-09-09 NOTE — Telephone Encounter (Addendum)
Terry B, NP will see him next week if he wants to come in.  If not, he will do a virtual visit.  [name], please call patient to schedule appt with [name], NP.

## 2020-09-09 NOTE — Telephone Encounter (Signed)
Pt is calling and stated he went to the ER about his pain from the cancer. They gave him some Percocet and he is stating he is out and wants another prescription. Said if he needs to make another appt he can. Asked if YU was only MD in clinic and he will see anybody due to the pain he is in. Please call or advise on what to do. Not sure if he would benefit from [facility].

## 2020-09-09 NOTE — Telephone Encounter (Signed)
FYI... See Merrily Pew, In person visit on 2/22 per pt request

## 2020-09-13 ENCOUNTER — Inpatient Hospital Stay: Payer: Medicare HMO | Attending: Hospice and Palliative Medicine | Admitting: Hospice and Palliative Medicine

## 2020-09-13 ENCOUNTER — Other Ambulatory Visit: Payer: Self-pay

## 2020-09-13 ENCOUNTER — Encounter: Payer: Self-pay | Admitting: Hospice and Palliative Medicine

## 2020-09-13 VITALS — BP 106/72 | HR 75 | Temp 96.0°F | Resp 18 | Wt 199.0 lb

## 2020-09-13 DIAGNOSIS — C61 Malignant neoplasm of prostate: Secondary | ICD-10-CM | POA: Diagnosis not present

## 2020-09-13 DIAGNOSIS — Z515 Encounter for palliative care: Secondary | ICD-10-CM

## 2020-09-13 DIAGNOSIS — Z9221 Personal history of antineoplastic chemotherapy: Secondary | ICD-10-CM | POA: Diagnosis not present

## 2020-09-13 DIAGNOSIS — Z79899 Other long term (current) drug therapy: Secondary | ICD-10-CM | POA: Insufficient documentation

## 2020-09-13 DIAGNOSIS — C7951 Secondary malignant neoplasm of bone: Secondary | ICD-10-CM | POA: Insufficient documentation

## 2020-09-13 DIAGNOSIS — I700 Atherosclerosis of aorta: Secondary | ICD-10-CM | POA: Diagnosis not present

## 2020-09-13 DIAGNOSIS — G893 Neoplasm related pain (acute) (chronic): Secondary | ICD-10-CM

## 2020-09-13 DIAGNOSIS — R531 Weakness: Secondary | ICD-10-CM | POA: Insufficient documentation

## 2020-09-13 MED ORDER — ONDANSETRON HCL 8 MG PO TABS: 8.0000 mg | ORAL_TABLET | Freq: Three times a day (TID) | ORAL | 2 refills | Status: DC | PRN

## 2020-09-13 MED ORDER — OXYCODONE-ACETAMINOPHEN 5-325 MG PO TABS: 1.0000 | ORAL_TABLET | Freq: Four times a day (QID) | ORAL | 0 refills | Status: DC | PRN

## 2020-09-13 NOTE — Progress Notes (Signed)
Patient here for oncology follow-up appointment, expresses  concerns of pain 7/10 in rib area, urinary urgency, N/V/D

## 2020-09-13 NOTE — Progress Notes (Signed)
[place]  Telephone:([phone] [phone]   Name: Terry Mitchell Date: 09/13/2020 MRN: [id number]  DOB: 1950/08/02  Patient Care Team: [name], MD as PCP - General (Family Medicine) [name], MD as Consulting Physician (Hematology and Oncology)    REASON FOR CONSULTATION: Terry Mitchell is a 70 y.o. male with multiple medical problems including stage IV prostate cancer metastatic to bone (diagnosed August 2019) status post XRT and chemotherapy on Lupron.  He was referred to palliative care to help address goals and manage ongoing symptoms..   SOCIAL HISTORY:     reports that he quit smoking about 13 months ago. His smoking use included cigars. He smoked 2.00 packs per day. He has never used smokeless tobacco. He reports current alcohol use of about 9.0 standard drinks of alcohol per week. He reports current drug use. Drugs: Cocaine and "Crack" cocaine.   Patient lives in a boardinghouse.  He does not drive and transports himself with a scooter.  He has a girlfriend who is available to help with his care as needed.  He has 4 adult children who live around the state of [place].  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   ADVANCE DIRECTIVES:  Does not have  CODE STATUS: DNR/DNI (DNR form signed on 09/13/20)  PAST MEDICAL HISTORY: Past Medical History:  Diagnosis Date  . Depression    recently went on disability d/t diagnosis  . History of recent fall 01/2018   missed the last step on ladder, causing back discomfort  . Hypertension   . Prostate cancer ([place]) 12/2017   prostate    PAST SURGICAL HISTORY:  Past Surgical History:  Procedure Laterality Date  . BACK SURGERY  2001    2 rods and 6 screws in back  . COLONOSCOPY WITH PROPOFOL N/A 09/24/2019   Procedure: COLONOSCOPY WITH PROPOFOL;  Surgeon: Jonathon Bellows, MD;  Location: [facility] ENDOSCOPY;  Service: Gastroenterology;   Laterality: N/A;  . Left shoulder surgery Left 1998   removed a piece of bone around collar bone  . PROSTATE BIOPSY N/A 02/28/2018   Procedure: PROSTATE BIOPSY;  Surgeon: Abbie Sons, MD;  Location: [facility] ORS;  Service: Urology;  Laterality: N/A;  . TRANSRECTAL ULTRASOUND N/A 02/28/2018   Procedure: TRANSRECTAL ULTRASOUND;  Surgeon: Abbie Sons, MD;  Location: [facility] ORS;  Service: Urology;  Laterality: N/A;    HEMATOLOGY/ONCOLOGY HISTORY:  Oncology History  Prostate cancer ([place])  03/19/2018 Initial Diagnosis   Prostate cancer ([place])   03/19/2018 Cancer Staging   Staging form: Prostate, AJCC 8th Edition - Clinical stage from 03/19/2018: Stage IVB (cTX, cN1, cM1b) - Signed by [name], MD on 05/01/2019   05/15/2019 - 10/06/2019 Chemotherapy   The patient had dexamethasone (DECADRON) 4 MG tablet, 8 mg, Oral, 2 times daily, 1 of 1 cycle, Start date: 05/01/2019, End date: 11/03/2019 pegfilgrastim-jmdb (FULPHILA) injection 6 mg, 6 mg, Subcutaneous,  Once, 6 of 6 cycles Administration: 6 mg (05/18/2019), 6 mg (07/06/2019), 6 mg (07/28/2019), 6 mg (09/15/2019), 6 mg (10/06/2019), 6 mg (08/18/2019) DOCEtaxel (TAXOTERE) 160 mg in sodium chloride 0.9 % 250 mL chemo infusion, 75 mg/m2 = 160 mg, Intravenous,  Once, 6 of 6 cycles Administration: 160 mg (05/15/2019), 160 mg (07/03/2019), 160 mg (07/27/2019), 160 mg (09/14/2019), 160 mg (10/05/2019), 160 mg (08/17/2019)  for chemotherapy treatment.      ALLERGIES:  is allergic to penicillins and lactose intolerance (gi).  MEDICATIONS:  Current Outpatient Medications  Medication Sig Dispense Refill  . amLODipine (NORVASC) 5 MG tablet Take 1 tablet (5 mg total) by mouth daily. 30 tablet 0  . atorvastatin (LIPITOR) 40 MG tablet     . loratadine (CLARITIN) 10 MG tablet Take 10 mg by mouth daily. In prep for fulphila    . losartan (COZAAR) 50 MG tablet Take 50 mg by mouth daily after breakfast.   2  . oxyCODONE-acetaminophen (PERCOCET) 5-325 MG tablet Take 1  tablet by mouth every 6 (six) hours as needed for severe pain. 15 tablet 0  . vitamin B-12 (CYANOCOBALAMIN) 1000 MCG tablet Take 1 tablet (1,000 mcg total) by mouth daily. 90 tablet 1  . albuterol (VENTOLIN HFA) 108 (90 Base) MCG/ACT inhaler Inhale 2 puffs into the lungs every 6 (six) hours as needed for wheezing or shortness of breath. (Patient not taking: Reported on 09/13/2020) 6.7 g 0  . enzalutamide (XTANDI) 40 MG tablet Take 4 tablets (160 mg total) by mouth daily. (Patient not taking: Reported on 09/13/2020) 120 tablet 0   No current facility-administered medications for this visit.   Facility-Administered Medications Ordered in Other Visits  Medication Dose Route Frequency Provider Last Rate Last Admin  . sodium chloride flush (NS) 0.9 % injection 10 mL  10 mL Intravenous Once [name], MD        VITAL SIGNS: BP 106/72 (BP Location: Left Arm, Patient Position: Sitting)   Pulse 75   Temp (!) 96 F (35.6 C) (Tympanic)   Resp 18   Wt 199 lb (90.3 kg)   SpO2 100%   BMI 26.99 kg/m  Filed Weights   09/13/20 1045  Weight: 199 lb (90.3 kg)    Estimated body mass index is 26.99 kg/m as calculated from the following:   Height as of 09/06/20: 6' (1.829 m).   Weight as of this encounter: 199 lb (90.3 kg).  LABS: CBC:    Component Value Date/Time   WBC 7.0 09/06/2020 0956   HGB 11.7 (L) 09/06/2020 0956   HCT 35.2 (L) 09/06/2020 0956   PLT 118 (L) 09/06/2020 0956   MCV 103.5 (H) 09/06/2020 0956   NEUTROABS 3.3 05/06/2020 1325   LYMPHSABS 0.7 05/06/2020 1325   MONOABS 0.4 05/06/2020 1325   EOSABS 0.1 05/06/2020 1325   BASOSABS 0.0 05/06/2020 1325   Comprehensive Metabolic Panel:    Component Value Date/Time   NA 137 09/06/2020 0956   K 3.8 09/06/2020 0956   CL 101 09/06/2020 0956   CO2 24 09/06/2020 0956   BUN 23 09/06/2020 0956   BUN 19 01/01/2018 1509   CREATININE 1.42 (H) 09/06/2020 0956   GLUCOSE 100 (H) 09/06/2020 0956   CALCIUM 9.3 09/06/2020 0956   AST 33  05/06/2020 1325   ALT 20 05/06/2020 1325   ALKPHOS 86 05/06/2020 1325   BILITOT 0.7 05/06/2020 1325   PROT 6.9 05/06/2020 1325   ALBUMIN 4.1 05/06/2020 1325    RADIOGRAPHIC STUDIES: DG Chest 2 View  Result Date: 09/06/2020 CLINICAL DATA:  Shortness of breath, mid back pain radiating into chest for 3 days, history metastatic prostate cancer EXAM: CHEST - 2 VIEW COMPARISON:  06/16/2018 FINDINGS: Normal heart size, mediastinal contours, and pulmonary vascularity. Lungs clear. No infiltrate, pleural effusion, or pneumothorax. Chronic deformity of distal LEFT clavicle. Sclerotic focus at approximately T6 vertebral body, posterior LEFT seventh rib, anterior RIGHT third rib, as well as at the lateral LEFT sixth rib suspicious for osseous metastases. Degenerative disc disease changes and dextroconvex  scoliosis of lower thoracic spine. IMPRESSION: No acute pulmonary abnormalities. Suspected osseous metastases. Electronically Signed   By: Lavonia Dana M.D.   On: 09/06/2020 10:18   CT Angio Chest PE W and/or Wo Contrast  Result Date: 09/06/2020 CLINICAL DATA:  Lower right chest pain.  History of prostate cancer EXAM: CT ANGIOGRAPHY CHEST WITH CONTRAST TECHNIQUE: Multidetector CT imaging of the chest was performed using the standard protocol during bolus administration of intravenous contrast. Multiplanar CT image reconstructions and MIPs were obtained to evaluate the vascular anatomy. CONTRAST:  14mL OMNIPAQUE IOHEXOL 350 MG/ML SOLN COMPARISON:  CT 04/28/2019 FINDINGS: Cardiovascular: Satisfactory opacification of the pulmonary arteries to the segmental level. No evidence of pulmonary embolism. Thoracic aorta is nonaneurysmal. Scattered atherosclerotic calcification of the aorta and coronary arteries. Normal heart size. No pericardial effusion. Mediastinum/Nodes: No axillary, mediastinal, or hilar lymphadenopathy. Thyroid, trachea, and esophagus within normal limits. Lungs/Pleura: Mild dependent subsegmental  atelectasis within the right lung. No focal airspace consolidation. No pleural effusion or pneumothorax. Pleural thickening adjacent to permeative metastatic lesion at the anterolateral aspect of the right third rib and lateral left third rib. Upper Abdomen: No acute findings. Musculoskeletal: Large mixed lytic and sclerotic lesion within the T6 vertebral body, enlarged compared to prior CT enlarging sclerotic lesions within the anterior aspects of the T11 and T12 vertebral bodies (series 10, images 47-48). Numerous mixed lytic and sclerotic rib lesions including posterior right third rib and anterolateral right third rib, lateral left third rib, and the posterior left seventh, eighth, and ninth ribs. Rib lesions have increased in size compared to prior CT. No pathologic fracture. Bilateral gynecomastia. Review of the MIP images confirms the above findings. IMPRESSION: 1. No evidence of pulmonary embolism. 2. Interval enlargement of mixed sclerotic and lytic osseous metastatic lesions including the T6, T11, and T12 vertebral bodies. Enlarging bilateral rib lesions including right third rib and left third, seventh, eighth, and ninth ribs. Aortic Atherosclerosis (ICD10-I70.0). Electronically Signed   By: Davina Poke D.O.   On: 09/06/2020 15:11    PERFORMANCE STATUS (ECOG) : 1 - Symptomatic but completely ambulatory  Review of Systems Unless otherwise noted, a complete review of systems is negative.  Physical Exam General: NAD Pulmonary: unlabored Extremities: no edema, no joint deformities Skin: no rashes Neurological: Weakness but otherwise nonfocal  IMPRESSION: Patient last saw Dr. Tasia Catchings on 05/06/2020.  At that time, he had poor tolerance to [place] and Lupron.  Additional treatment options were discussed but patient ultimately declined.  He was noted to have worsening renal function but patient declined urology follow-up.  He opted not to pursue further treatments or testing and ultimately left the  clinic without a desire for follow-up.  Patient was seen in the ER on 09/06/2020 for chest and back pain.  He underwent CTA of the chest with no evidence for PE.  However, he was found to have interval enlargement of mixed sclerotic and lytic osseous metastatic lesions involving T6, T11, and T12 vertebral bodies and multiple ribs bilaterally.  Patient was given prescription for Percocet in the ER (#15 tablets).  Patient says that he has been taking 1 or 2 tablets daily and is found it to be effective at managing his pain.  He currently rates pain as 4 out of 10 but says that it at times is as high as 7-8 out of 10.  Pain is primarily in his ribs and back.  Generally, would recommend an anti-inflammatory.  However, he does have CKD and so would avoid nephrotoxins.  Could consider short course of steroids if needed.  However, for now we will continue Percocet and send refill today.  We discussed safe storage and administration of opioid pain medications.  Symptomatically, patient also endorses occasional nausea.  Will refill antiemetics.  Patient confirmed that he is not interested in pursuing any further work-up or treatment of the cancer.  He says that his father and uncles all died in their fifties and so he feels blessed to be 20.  He says that he has strong faith in God and that he believes his end-of-life will calm when it is time.  Patient does understand that untreated cancer will progress and may very well because his eventual death.  Patient says that his goal is to be as comfortable as possible until his end-of-life.  We discussed CODE STATUS today.  Patient says that he is not interested in resuscitation or to have his life prolonged artificially on machines.  He was okay with DNR/DNI and he says he will communicate his wishes to his family.  I completed a DNR order today for him to take home.  Patient was previously living in a boardinghouse but is now staying with family.  He says that his  plan is to move in with his girlfriend next month in an apartment in [place].  Patient was in agreement with having palliative care follow at home.  I would recommend hospice involvement when patient begins to decline.  PDMP reviewed.   PLAN: -Best supportive care -Referral to home-based palliative care -Would recommend future hospice involvement when patient starts to decline -Refill Percocet 5-325mg  every 6 hours as needed #60 -Ondansetron 8 mg every 8 hours as needed -Daily bowel regimen -DNR/DNI -RTC in about a month  Case and plan discussed with Dr. Tasia Catchings  Patient expressed understanding and was in agreement with this plan. He also understands that He can call the clinic at any time with any questions, concerns, or complaints.     Time Total: 25 minutes  Visit consisted of counseling and education dealing with the complex and emotionally intense issues of symptom management and palliative care in the setting of serious and potentially life-threatening illness.Greater than 50%  of this time was spent counseling and coordinating care related to the above assessment and plan.  Signed by: Altha Harm, PhD, NP-C

## 2020-09-27 ENCOUNTER — Telehealth: Payer: Self-pay | Admitting: Nurse Practitioner

## 2020-09-27 NOTE — Telephone Encounter (Signed)
Spoke with patient about the Palliative referral and to offer to schedule an appointment with NP and patient said that he already has [facility] coming out to check on him now and he doesn't feel like he needs Palliative services at this time. I will cancel the referral and notify referring provider and Palliative Team of above.

## 2020-10-11 ENCOUNTER — Inpatient Hospital Stay: Payer: Medicare HMO | Attending: Hospice and Palliative Medicine | Admitting: Hospice and Palliative Medicine

## 2020-10-13 ENCOUNTER — Telehealth: Payer: Self-pay | Admitting: Hospice and Palliative Medicine

## 2020-10-13 MED ORDER — OXYCODONE-ACETAMINOPHEN 5-325 MG PO TABS: 1.0000 | ORAL_TABLET | Freq: Four times a day (QID) | ORAL | 0 refills | Status: DC | PRN

## 2020-10-13 NOTE — Telephone Encounter (Signed)
I received a call from [place] [phone]).  She has been following patient at home.  She says that patient is now in agreement with hospice.  Discussed with Dr. Tasia Catchings and I called in a hospice referral to [facility]. Theadora Rama is hopeful that she can have a joint visit with hospice to hand-off care.   Brandy did request refill of Percocet for patient and I will send Rx to pharmacy. She says pain is well controlled and no issues reported with medication administration or adverse effects.

## 2020-10-28 ENCOUNTER — Telehealth: Payer: Self-pay | Admitting: *Deleted

## 2020-10-28 MED ORDER — OXYCODONE-ACETAMINOPHEN 5-325 MG PO TABS: 1.0000 | ORAL_TABLET | ORAL | 0 refills | Status: DC | PRN

## 2020-10-28 NOTE — Telephone Encounter (Signed)
Stephanie with Hospice called reporting that patient needs a refill of his Percocet 5/325 mg. She also reports that patient tells her that he only has to take 1 tablet during the day, but his pain intensifies at night when he lays down to sleep. He takes a pill before bed and has to get up and take another one 4 hours later. She is asking what can be done about that. Please advise and refill his Oxycodone as well if it is not being changed

## 2020-10-28 NOTE — Telephone Encounter (Signed)
I spoke with patient's hospice nurse.  Will refill Percocet and increase to 1 to 2 tablets every 4 hours as needed.  Patient can consider taking 2 tablets at bedtime.  However, Percocet is inherently short acting.  Could consider a nighttime dose of MS Contin if needed.  Nurse plans to have patient liberalize the Percocet and we will see how that works first.

## 2020-11-14 ENCOUNTER — Other Ambulatory Visit: Payer: Self-pay | Admitting: Hospice and Palliative Medicine

## 2020-11-14 MED ORDER — OXYCODONE-ACETAMINOPHEN 5-325 MG PO TABS: 1.0000 | ORAL_TABLET | ORAL | 0 refills | Status: DC | PRN

## 2020-11-14 NOTE — Progress Notes (Signed)
Hospice nurse requested refill of Percocet. Rx sent to pharmacy. #60

## 2020-11-24 ENCOUNTER — Other Ambulatory Visit: Payer: Self-pay | Admitting: Hospice and Palliative Medicine

## 2020-11-24 MED ORDER — OXYCODONE-ACETAMINOPHEN 5-325 MG PO TABS: 1.0000 | ORAL_TABLET | ORAL | 0 refills | Status: DC | PRN

## 2020-11-24 NOTE — Progress Notes (Signed)
Hospice requested refill of Percocet. #90

## 2020-12-02 ENCOUNTER — Other Ambulatory Visit: Payer: Self-pay | Admitting: Hospice and Palliative Medicine

## 2020-12-02 MED ORDER — MORPHINE SULFATE ER 15 MG PO TBCR: 15.0000 mg | EXTENDED_RELEASE_TABLET | Freq: Two times a day (BID) | ORAL | 0 refills | Status: DC

## 2020-12-02 NOTE — Progress Notes (Signed)
I spoke with patient's hospice nurse. Patient is taking, on average, 12 Percocet a day for pain. The Percocet is reportedly effective at reducing pain from 8 out of 10 to 3 out of 10 but is not last the full four hours in between dosing.   Will start MS Contin 15mg  Q12H, #60. Continue Percocet as needed for BTP.

## 2020-12-09 ENCOUNTER — Other Ambulatory Visit: Payer: Self-pay | Admitting: Hospice and Palliative Medicine

## 2020-12-09 MED ORDER — OXYCODONE-ACETAMINOPHEN 5-325 MG PO TABS: 1.0000 | ORAL_TABLET | ORAL | 0 refills | Status: DC | PRN

## 2020-12-09 NOTE — Progress Notes (Signed)
I received a call from patient's hospice nurse who requested refill for Percocet.  Rx sent to pharmacy.

## 2020-12-10 ENCOUNTER — Emergency Department
Admission: EM | Admit: 2020-12-10 | Discharge: 2020-12-11 | Disposition: A | Discharge status: Other Acute Inpatient Hospital | Attending: Emergency Medicine | Admitting: Emergency Medicine

## 2020-12-10 ENCOUNTER — Emergency Department

## 2020-12-10 ENCOUNTER — Other Ambulatory Visit: Payer: Self-pay

## 2020-12-10 DIAGNOSIS — R079 Chest pain, unspecified: Secondary | ICD-10-CM | POA: Diagnosis not present

## 2020-12-10 DIAGNOSIS — N1832 Chronic kidney disease, stage 3b: Secondary | ICD-10-CM | POA: Diagnosis not present

## 2020-12-10 DIAGNOSIS — I129 Hypertensive chronic kidney disease with stage 1 through stage 4 chronic kidney disease, or unspecified chronic kidney disease: Secondary | ICD-10-CM | POA: Insufficient documentation

## 2020-12-10 DIAGNOSIS — G8929 Other chronic pain: Secondary | ICD-10-CM | POA: Insufficient documentation

## 2020-12-10 DIAGNOSIS — C61 Malignant neoplasm of prostate: Secondary | ICD-10-CM | POA: Insufficient documentation

## 2020-12-10 DIAGNOSIS — Z79899 Other long term (current) drug therapy: Secondary | ICD-10-CM | POA: Diagnosis not present

## 2020-12-10 DIAGNOSIS — R109 Unspecified abdominal pain: Secondary | ICD-10-CM | POA: Diagnosis not present

## 2020-12-10 DIAGNOSIS — Z20822 Contact with and (suspected) exposure to covid-19: Secondary | ICD-10-CM | POA: Insufficient documentation

## 2020-12-10 DIAGNOSIS — R531 Weakness: Secondary | ICD-10-CM | POA: Diagnosis present

## 2020-12-10 DIAGNOSIS — Z87891 Personal history of nicotine dependence: Secondary | ICD-10-CM | POA: Insufficient documentation

## 2020-12-10 DIAGNOSIS — G96198 Other disorders of meninges, not elsewhere classified: Secondary | ICD-10-CM

## 2020-12-10 LAB — COMPREHENSIVE METABOLIC PANEL
ALT: 16 U/L (ref 0–44)
AST: 31 U/L (ref 15–41)
Albumin: 4.2 g/dL (ref 3.5–5.0)
Alkaline Phosphatase: 151 U/L — ABNORMAL HIGH (ref 38–126)
Anion gap: 11 (ref 5–15)
BUN: 20 mg/dL (ref 8–23)
CO2: 25 mmol/L (ref 22–32)
Calcium: 9.8 mg/dL (ref 8.9–10.3)
Chloride: 102 mmol/L (ref 98–111)
Creatinine, Ser: 1.35 mg/dL — ABNORMAL HIGH (ref 0.61–1.24)
GFR, Estimated: 57 mL/min — ABNORMAL LOW (ref >60)
Glucose, Bld: 108 mg/dL — ABNORMAL HIGH (ref 70–99)
Potassium: 4 mmol/L (ref 3.5–5.1)
Sodium: 138 mmol/L (ref 135–145)
Total Bilirubin: 0.8 mg/dL (ref 0.3–1.2)
Total Protein: 7.4 g/dL (ref 6.5–8.1)

## 2020-12-10 LAB — CBC WITH DIFFERENTIAL/PLATELET
Abs Immature Granulocytes: 0.02 10*3/uL (ref 0.00–0.07)
Basophils Absolute: 0 10*3/uL (ref 0.0–0.1)
Basophils Relative: 0 %
Eosinophils Absolute: 0.1 10*3/uL (ref 0.0–0.5)
Eosinophils Relative: 1 %
HCT: 37.8 % — ABNORMAL LOW (ref 39.0–52.0)
Hemoglobin: 12.4 g/dL — ABNORMAL LOW (ref 13.0–17.0)
Immature Granulocytes: 0 %
Lymphocytes Relative: 11 %
Lymphs Abs: 0.8 10*3/uL (ref 0.7–4.0)
MCH: 33.9 pg (ref 26.0–34.0)
MCHC: 32.8 g/dL (ref 30.0–36.0)
MCV: 103.3 fL — ABNORMAL HIGH (ref 80.0–100.0)
Monocytes Absolute: 0.7 10*3/uL (ref 0.1–1.0)
Monocytes Relative: 9 %
Neutro Abs: 5.7 10*3/uL (ref 1.7–7.7)
Neutrophils Relative %: 79 %
Platelets: 170 10*3/uL (ref 150–400)
RBC: 3.66 MIL/uL — ABNORMAL LOW (ref 4.22–5.81)
RDW: 13.2 % (ref 11.5–15.5)
WBC: 7.3 10*3/uL (ref 4.0–10.5)
nRBC: 0 % (ref 0.0–0.2)

## 2020-12-10 LAB — URINALYSIS, COMPLETE (UACMP) WITH MICROSCOPIC
Bacteria, UA: NONE SEEN
Bilirubin Urine: NEGATIVE
Glucose, UA: NEGATIVE mg/dL
Hgb urine dipstick: NEGATIVE
Ketones, ur: 5 mg/dL — AB
Leukocytes,Ua: NEGATIVE
Nitrite: NEGATIVE
Protein, ur: NEGATIVE mg/dL
Specific Gravity, Urine: 1.028 (ref 1.005–1.030)
pH: 5 (ref 5.0–8.0)

## 2020-12-10 LAB — LIPASE, BLOOD: Lipase: 42 U/L (ref 11–51)

## 2020-12-10 LAB — TROPONIN I (HIGH SENSITIVITY)
Troponin I (High Sensitivity): 6 ng/L (ref <18)
Troponin I (High Sensitivity): 6 ng/L (ref <18)

## 2020-12-10 LAB — RESP PANEL BY RT-PCR (FLU A&B, COVID) ARPGX2
Influenza A by PCR: NEGATIVE
Influenza B by PCR: NEGATIVE
SARS Coronavirus 2 by RT PCR: NEGATIVE

## 2020-12-10 IMAGING — CT CT HEAD W/O CM
3 series · 16 of 47 positions shown, 19 images · non-contrast
Comparison: [DATE].

CLINICAL DATA: Dizziness.

EXAM:
CT HEAD WITHOUT CONTRAST
TECHNIQUE: Contiguous axial images were obtained from the base of the skull
through the vertex without intravenous contrast.

[Series 2: head wo · axial · 0.46mm/px · z∈[-157,-17]mm · 10 of 34 slices shown, 13 images]
[im 3/34  brain]
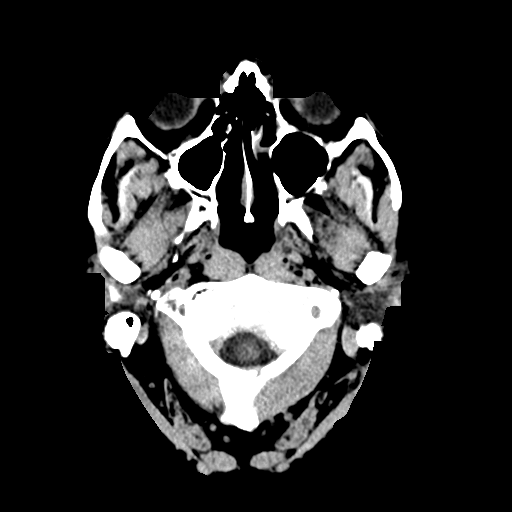
[im 3/34  bone]
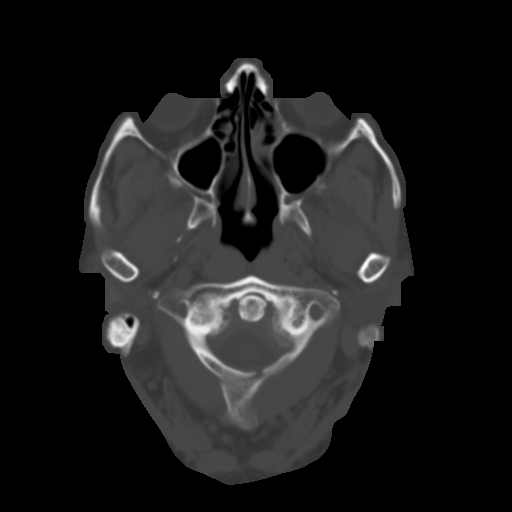
[im 6/34  brain]
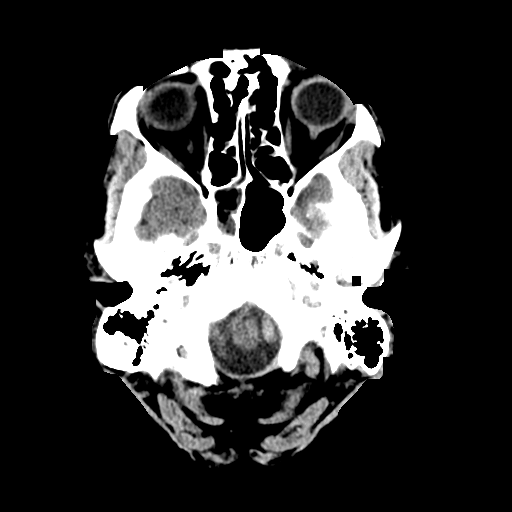
[im 10/34  brain]
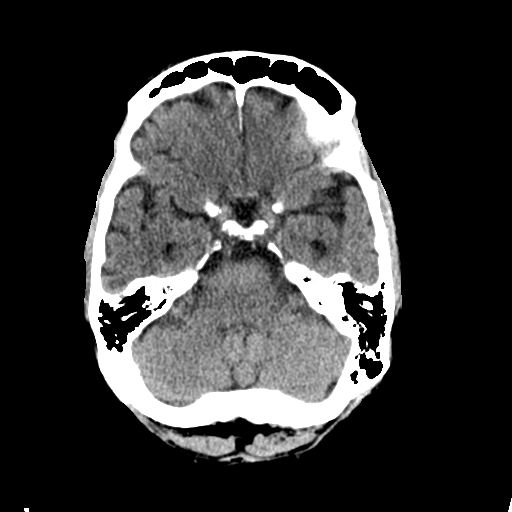
[im 12/34  brain]
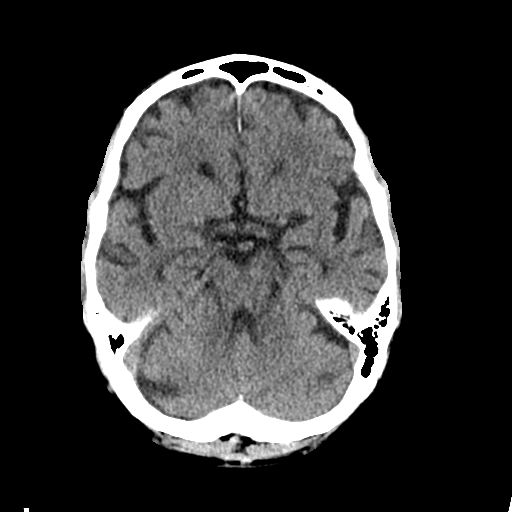
[im 15/34  brain]
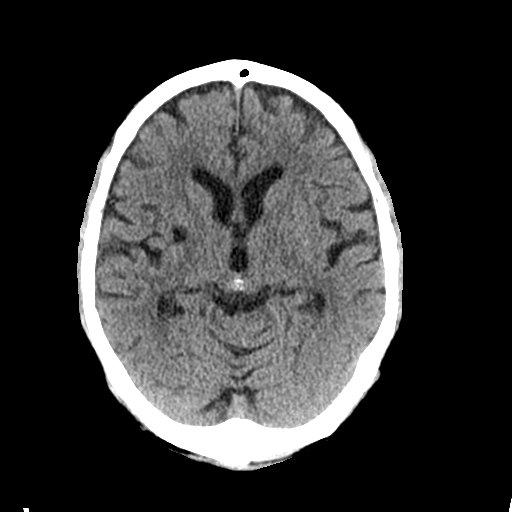
[im 15/34  bone]
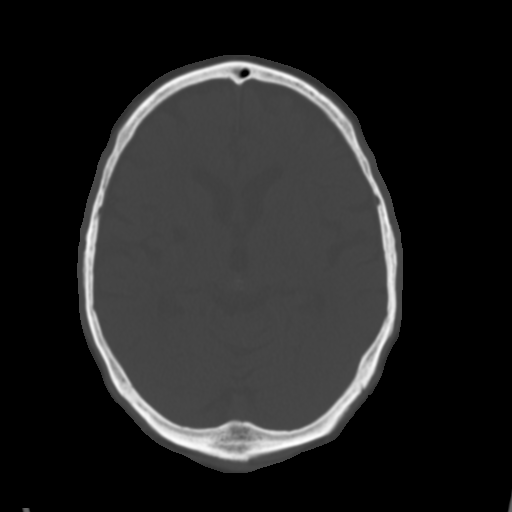
[im 19/34  brain]
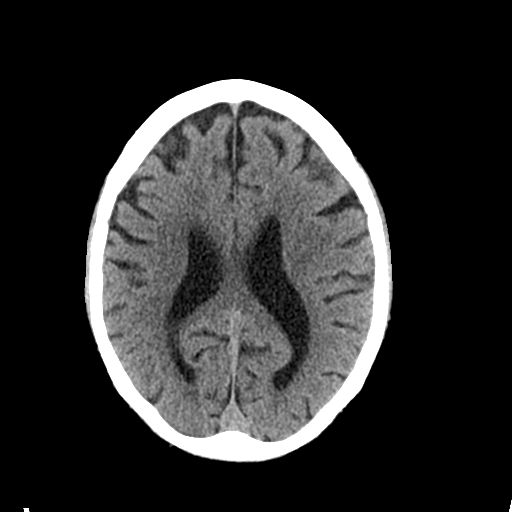
[im 22/34  brain]
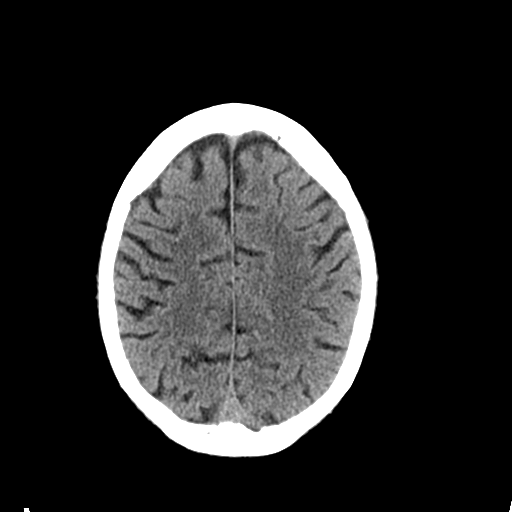
[im 26/34  brain]
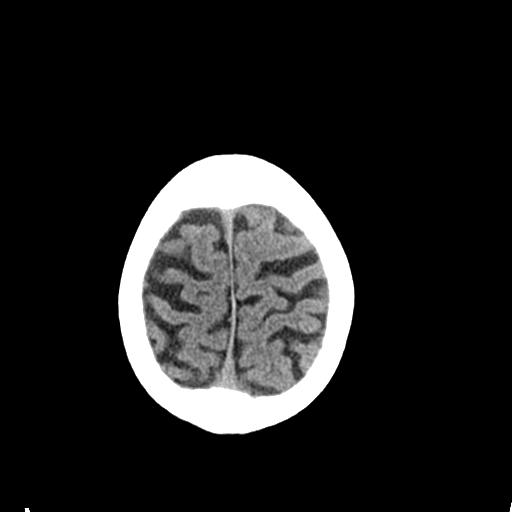
[im 28/34  brain]
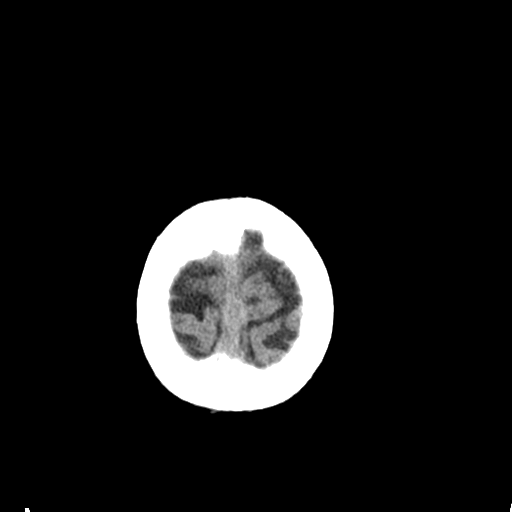
[im 28/34  bone]
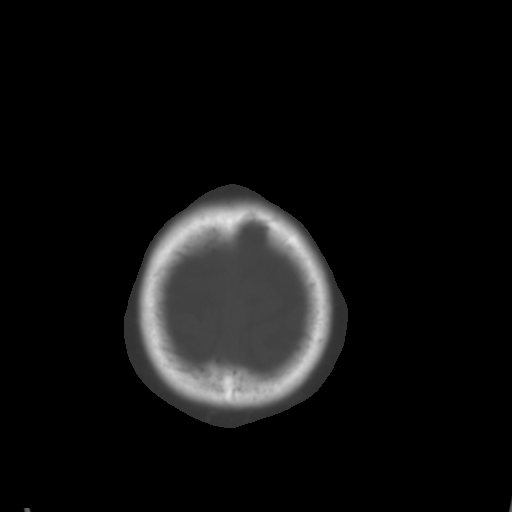
[im 31/34  brain]
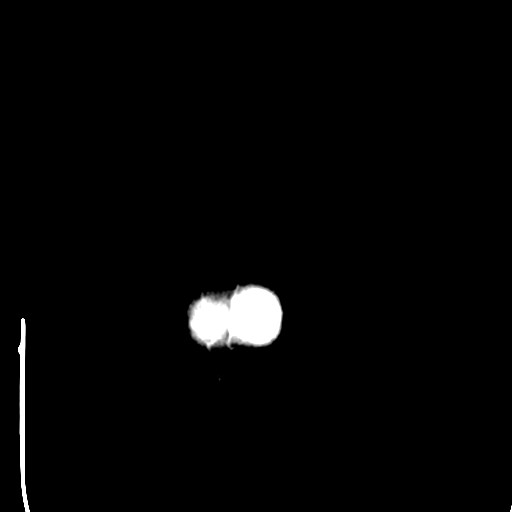

[Series 4: coronal soft tissue · coronal · 0.35mm/px · 3 of 74 slices shown]
[im 25/74  brain]
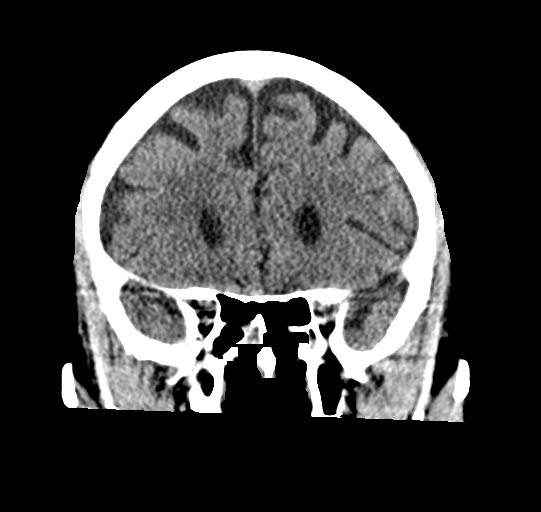
[im 33/74  brain]
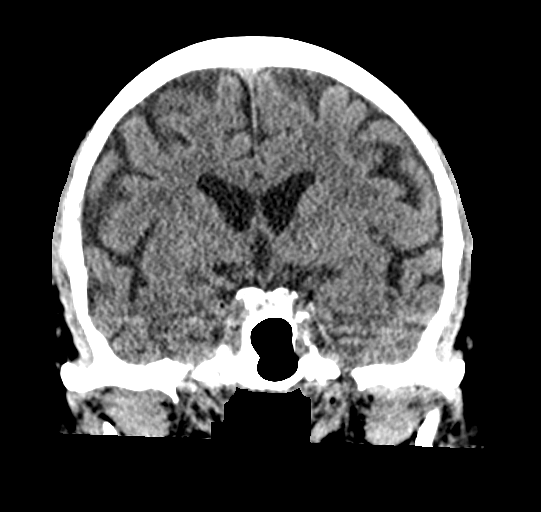
[im 41/74  brain]
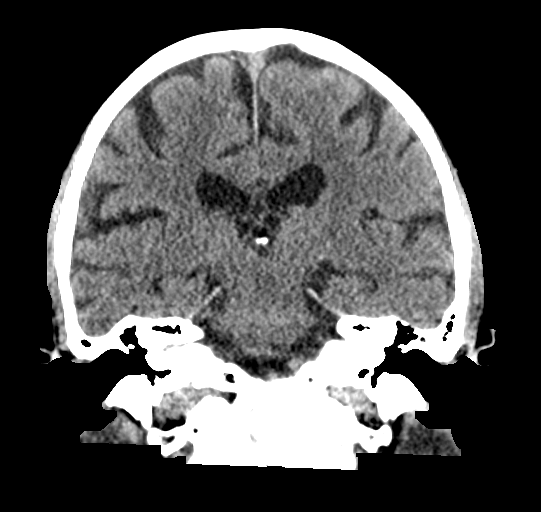

[Series 5: sagittal soft tissue · sagittal · 0.36mm/px · 3 of 60 slices shown]
[im 20/60  brain]
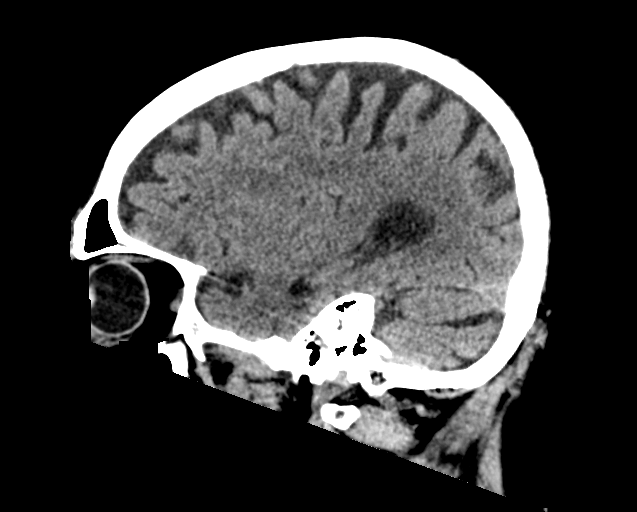
[im 30/60  brain]
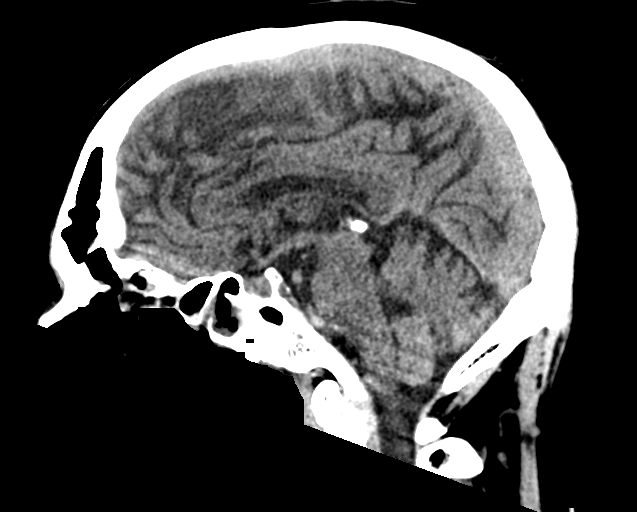
[im 40/60  brain]
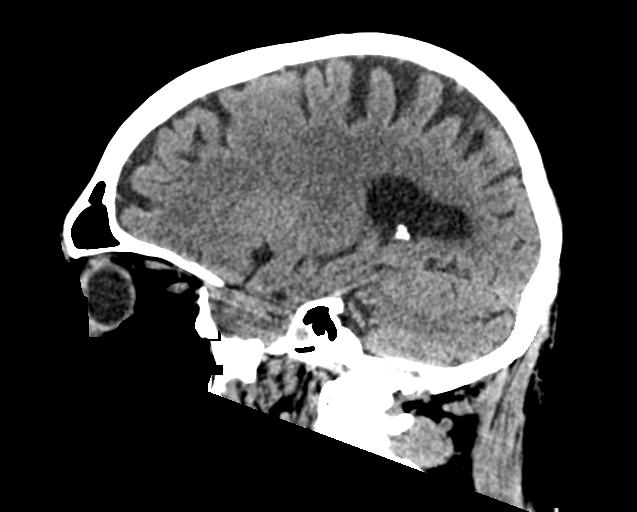

[16 of 47 positions shown; findings below may reference images not displayed]

FINDINGS: Brain: Old lacunar infarction is noted in right basal ganglia. No
mass effect or midline shift is noted. Ventricular size is within
normal limits. There is no evidence of mass lesion, hemorrhage or
acute infarction.

Vascular: No hyperdense vessel or unexpected calcification.

Skull: Normal. Negative for fracture or focal lesion.

Sinuses/Orbits: Right sphenoid sinusitis is noted.

Other: None.
IMPRESSION: No acute intracranial abnormality seen.  Right sphenoid sinusitis.

## 2020-12-10 IMAGING — MR MR LUMBAR SPINE WO/W CM
6 of 7 series · 37 of 48 positions shown · IV contrast (7.5ml Gadavist)
Comparison: None.

CLINICAL DATA: Shortness of breath and lower extremity swelling

EXAM:
MRI LUMBAR SPINE WITHOUT AND WITH CONTRAST
TECHNIQUE: Multiplanar and multiecho pulse sequences of the lumbar spine were
obtained without and with intravenous contrast.
CONTRAST:  7.5mL GADAVIST GADOBUTROL 1 MMOL/ML IV SOLN

[Series 25: T2 · sagittal · 4.0mm · 1.02mm/px · 6 of 18 slices shown (1 of 2)]
[im 1/18]
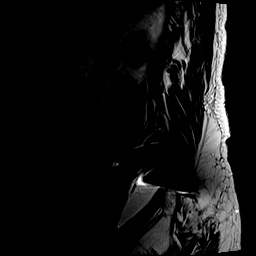
[im 4/18]
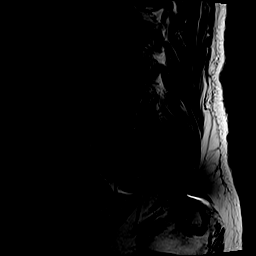
[im 7/18]
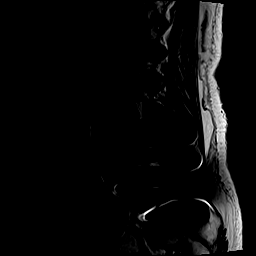
[im 11/18]
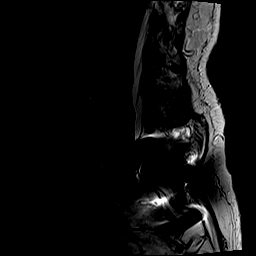
[im 14/18]
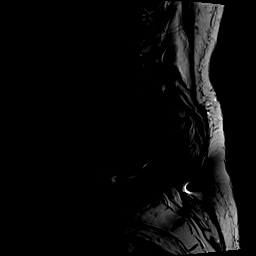
[im 18/18]
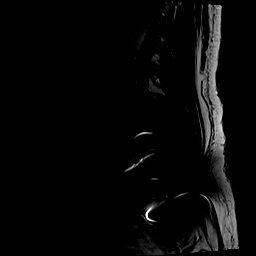

[Series 26: T1 · sagittal · 4.0mm · 1.02mm/px · 5 of 18 slices shown (1 of 2)]
[im 1/18]
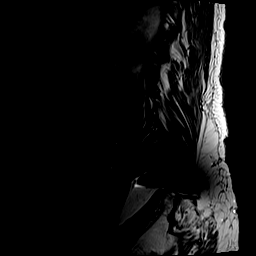
[im 5/18]
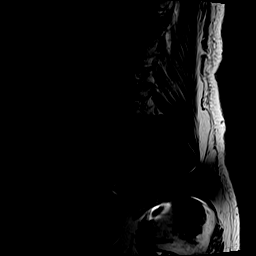
[im 9/18]
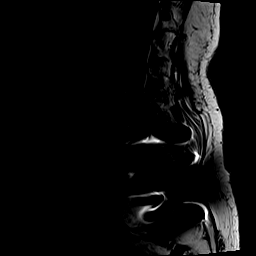
[im 13/18]
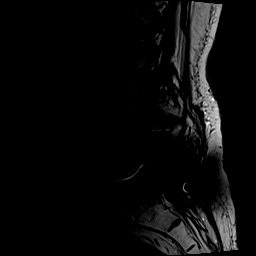
[im 18/18]
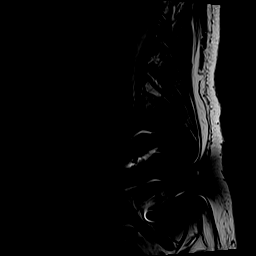

[Series 27: STIR · sagittal · 4.0mm · 0.51mm/px · 3 of 18 slices shown]
[im 1/18]
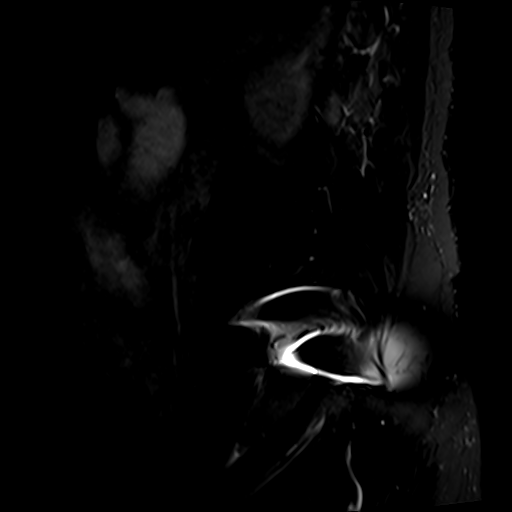
[im 5/18]
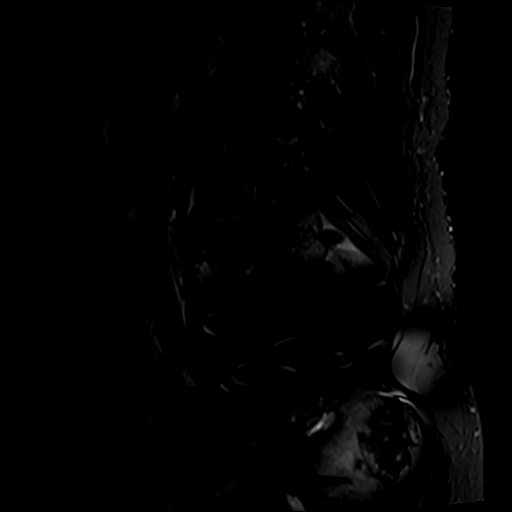
[im 9/18]
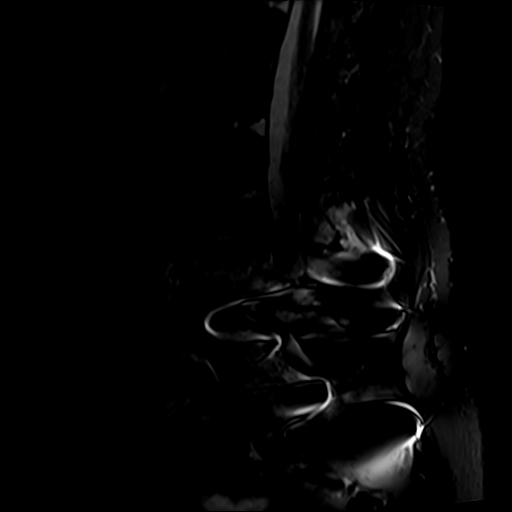

[Series 28: T2 · axial · 4.0mm · 0.78mm/px · z∈[-563,-362]mm · 9 of 34 slices shown (2 of 2)]
[im 1/34]
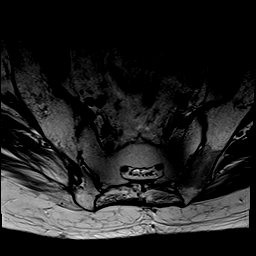
[im 5/34]
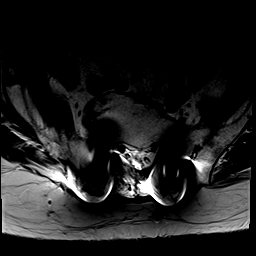
[im 9/34]
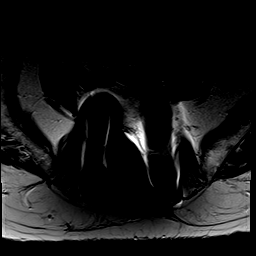
[im 13/34]
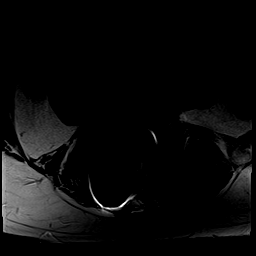
[im 17/34]
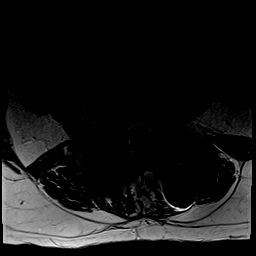
[im 21/34]
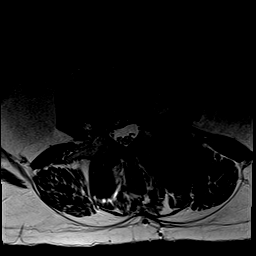
[im 25/34]
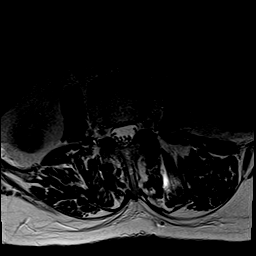
[im 29/34]
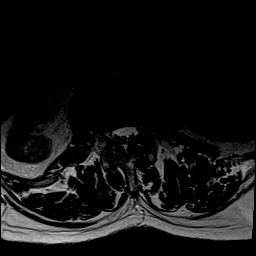
[im 34/34]
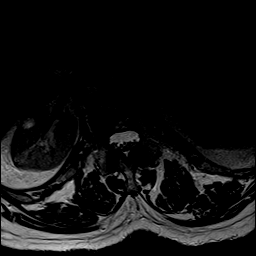

[Series 29: T1 · axial · 4.0mm · 0.39mm/px · z∈[-563,-362]mm · 9 of 34 slices shown (2 of 2)]
[im 1/34]
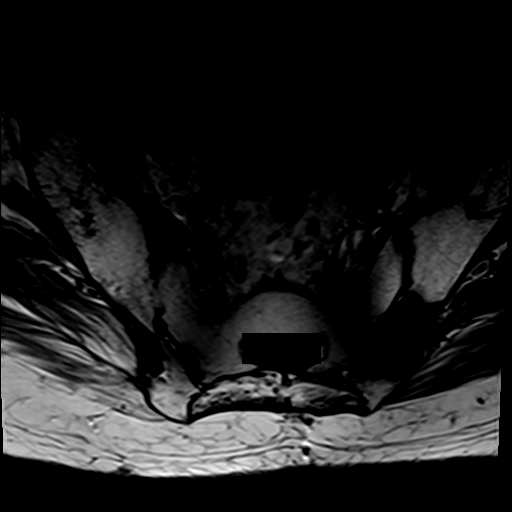
[im 5/34]
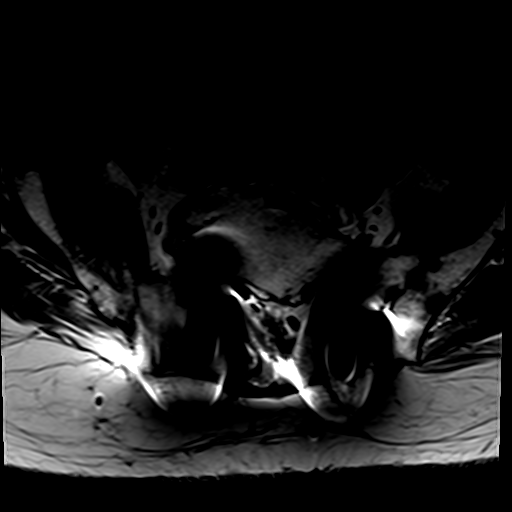
[im 9/34]
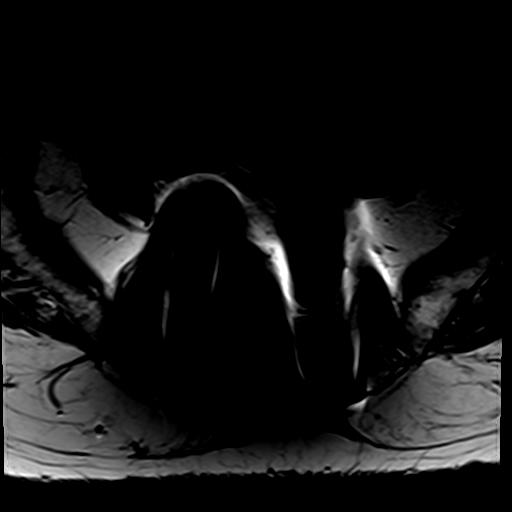
[im 13/34]
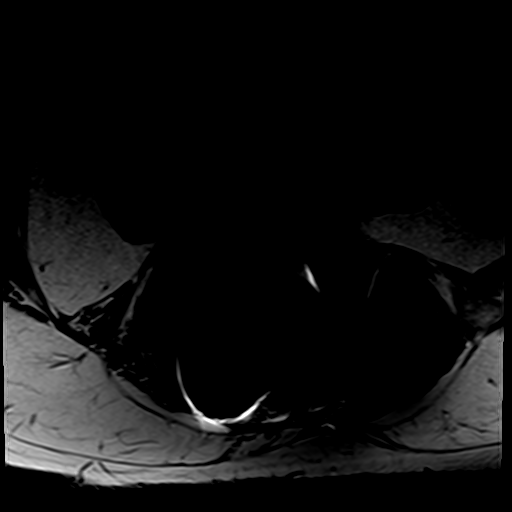
[im 17/34]
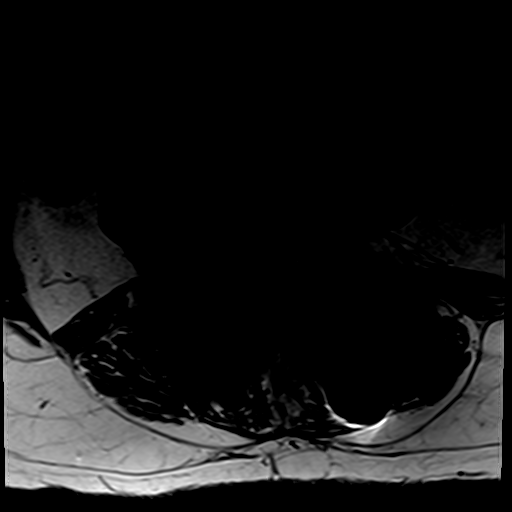
[im 21/34]
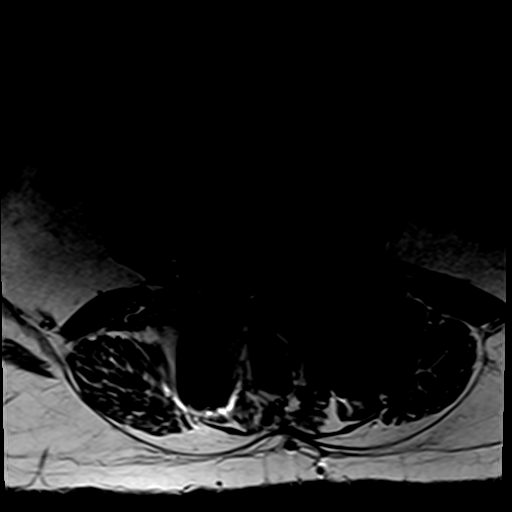
[im 25/34]
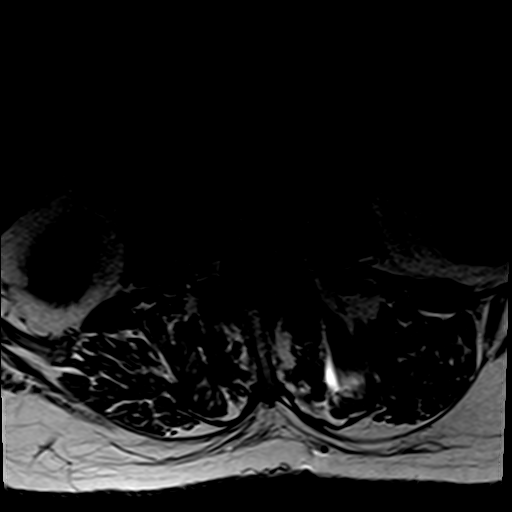
[im 29/34]
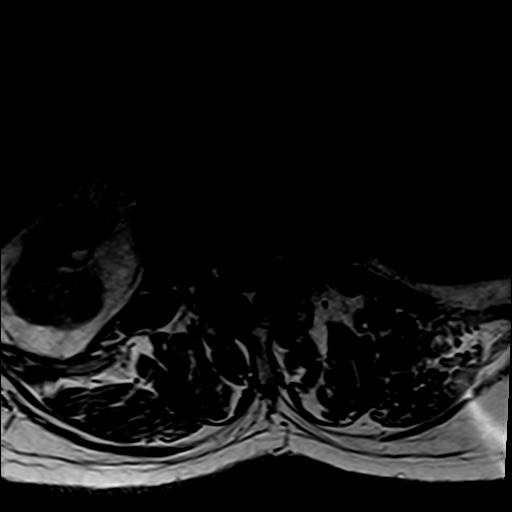
[im 34/34]
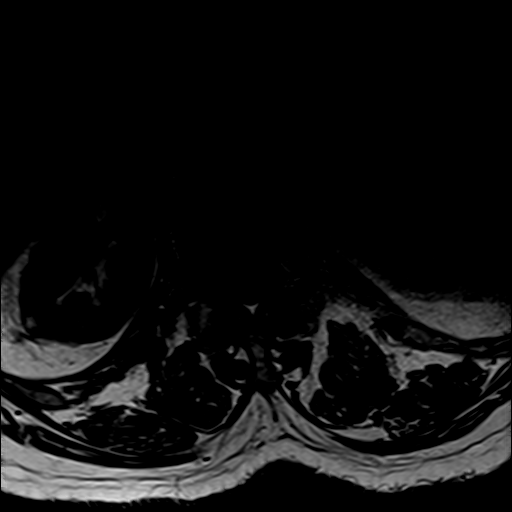

[Series 49: T1 fat-sat post-contrast · sagittal · 4.0mm · 1.02mm/px · 5 of 18 slices shown]
[im 1/18]
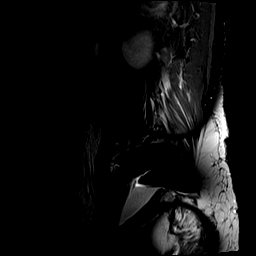
[im 5/18]
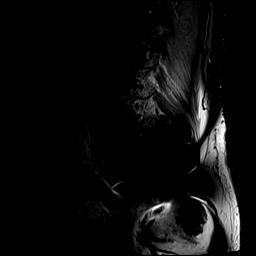
[im 9/18]
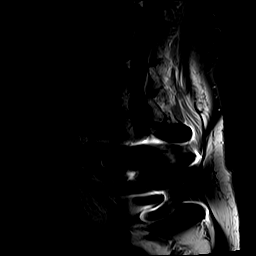
[im 13/18]
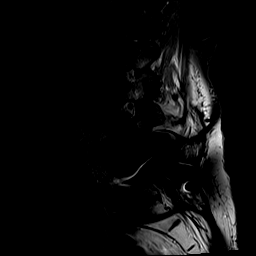
[im 18/18]
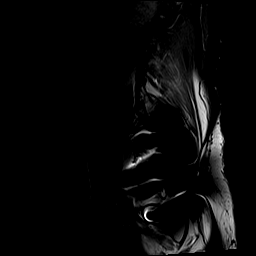

[37 of 48 positions shown; findings below may reference images not displayed]

FINDINGS: Segmentation:  Standard

Alignment:  Posterior fusion at L4-S1

Vertebrae: Osseous metastases at all levels. The largest lesion is
at T12. No pathologic fracture.

Conus medullaris and cauda equina: Conus extends to the L1 level.
Conus and cauda equina appear normal.

Paraspinal and other soft tissues: Negative

Disc levels:

Limited assessment of the lower lumbar spinal canal due to magnetic
susceptibility effects from spinal hardware. No spinal canal or
neural foraminal stenosis of the upper lumbar spine.
IMPRESSION: 1. Diffuse lumbar osseous metastatic disease without pathologic
fracture.
2. No spinal canal or neural foraminal stenosis of the upper lumbar
spine.

## 2020-12-10 IMAGING — MR MR THORACIC SPINE WO/W CM
25 of 30 series · 38 of 48 positions shown · IV contrast (gadavist)
Comparison: None.

CLINICAL DATA: Difficulty walking.

EXAM:
MRI THORACIC WITHOUT AND WITH CONTRAST
TECHNIQUE: Multiplanar and multiecho pulse sequences of the thoracic spine were
obtained without and with intravenous contrast.
CONTRAST:  7.5mL GADAVIST GADOBUTROL 1 MMOL/ML IV SOLN

[Series 18: T1 · sagittal · 6.0mm · 1.88mm/px · 1 of 9 slices shown (1 of 6)]
[im 1/9]
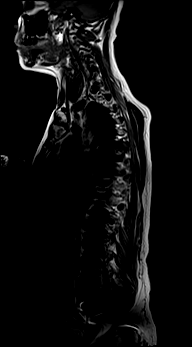

[Series 19: T2 · sagittal · 3.0mm · 1.41mm/px · 1 of 20 slices shown (1 of 6)]
[im 1/20]
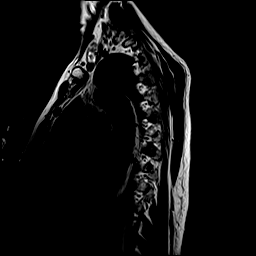

[Series 20: T1 · sagittal · 3.0mm · 1.41mm/px · 1 of 20 slices shown (2 of 6)]
[im 1/20]
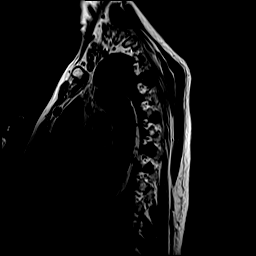

[Series 21: STIR · sagittal · 3.0mm · 0.70mm/px · 1 of 20 slices shown (1 of 2)]
[im 1/20]
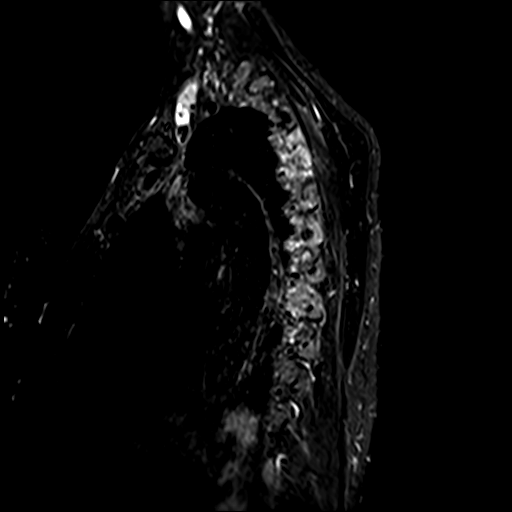

[Series 22: T2 · axial · 4.0mm · 0.59mm/px · 1 of 39 slices shown (2 of 6)]
[im 1/39]
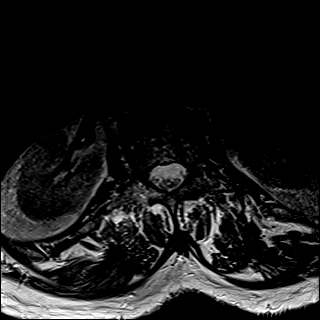

[Series 23: GRE · axial · 4.0mm · 0.37mm/px · 1 of 39 slices shown]
[im 1/39]
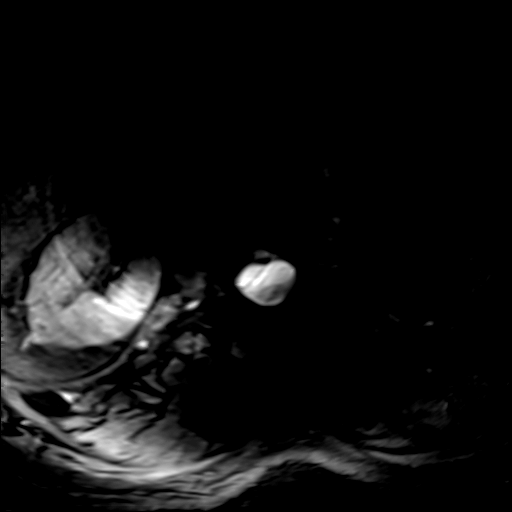

[Series 24: T1 post-contrast · axial · non-contrast · 4.0mm · 0.37mm/px · 1 of 39 slices shown (1 of 6)]
[im 1/39]
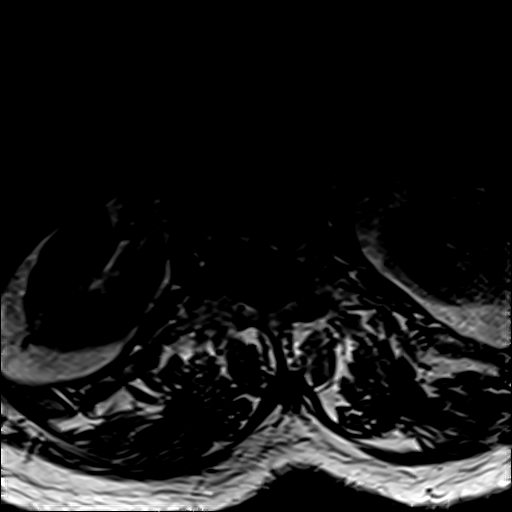

[Series 25: T2 · sagittal · 4.0mm · 1.02mm/px · 1 of 18 slices shown (3 of 6)]
[im 1/18]
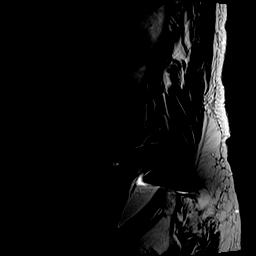

[Series 26: T1 · sagittal · 4.0mm · 1.02mm/px · 1 of 18 slices shown (3 of 6)]
[im 1/18]
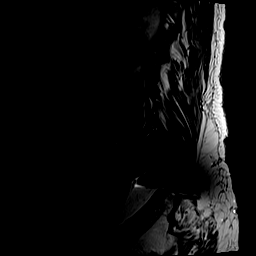

[Series 27: STIR · sagittal · 4.0mm · 0.51mm/px · 1 of 18 slices shown (2 of 2)]
[im 1/18]
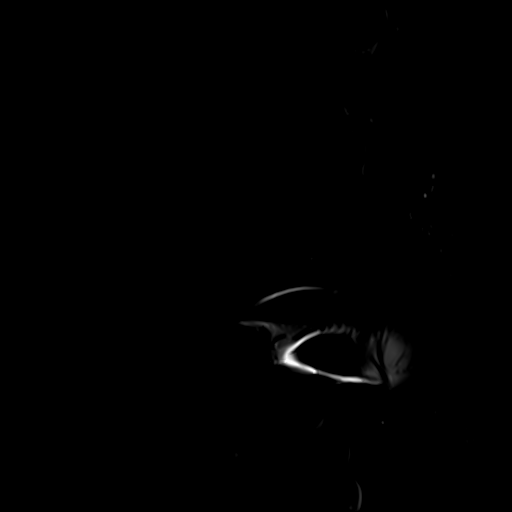

[Series 28: T2 · axial · 4.0mm · 0.78mm/px · 1 of 34 slices shown (4 of 6)]
[im 1/34]
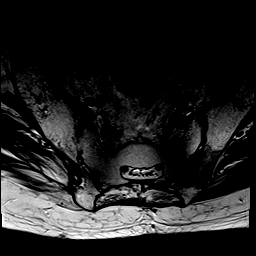

[Series 29: T1 · axial · 4.0mm · 0.39mm/px · 1 of 34 slices shown (4 of 6)]
[im 1/34]
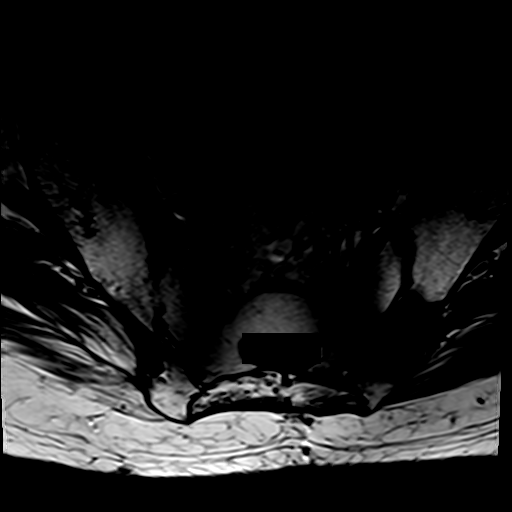

[Series 34: ax dwi_tracew · axial · 3.0mm · 0.65mm/px · z∈[+54,+209]mm · 2 of 48 slices shown]
[im 1/48]
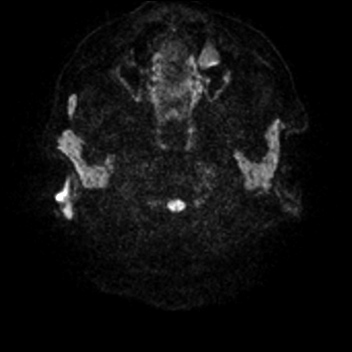
[im 48/48]
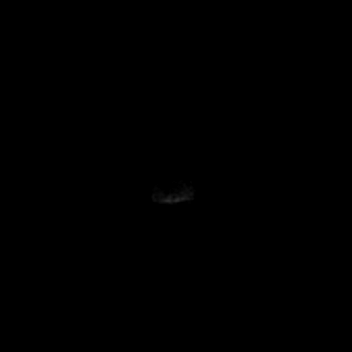

[Series 35: ax dwi_adc · axial · 3.0mm · 0.65mm/px · z∈[+54,+209]mm · 2 of 48 slices shown]
[im 1/48]
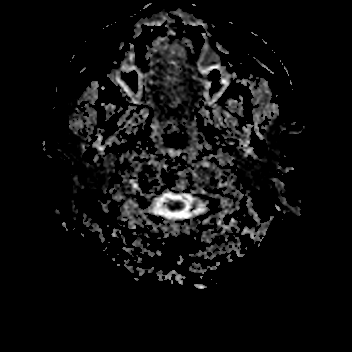
[im 48/48]
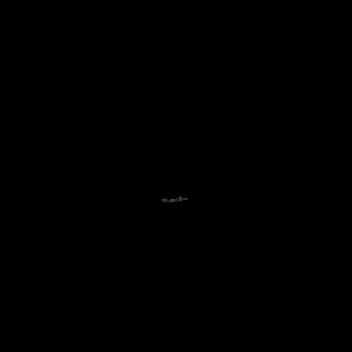

[Series 38: T1 · sagittal · 5.0mm · 0.62mm/px · 1 of 23 slices shown (5 of 6)]
[im 1/23]
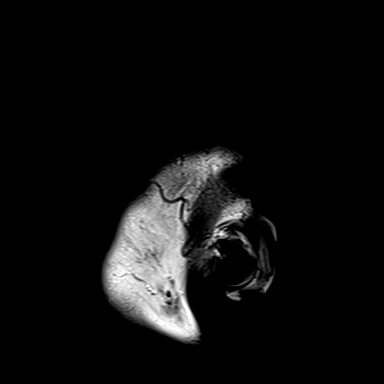

[Series 39: T2 · axial · 5.0mm · 0.45mm/px · 1 of 25 slices shown (5 of 6)]
[im 1/25]
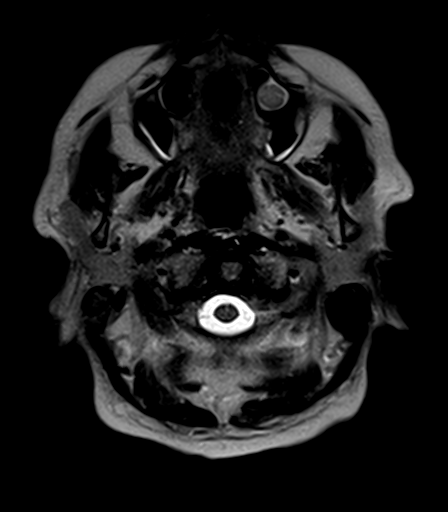

[Series 45: T1 · axial · 1.0mm · 0.98mm/px · z∈[+47,+206]mm · 6 of 160 slices shown (6 of 6)]
[im 1/160]
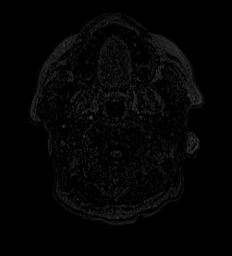
[im 32/160]
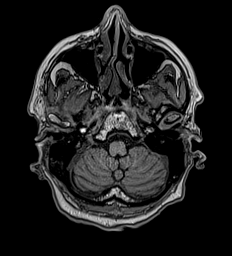
[im 64/160]
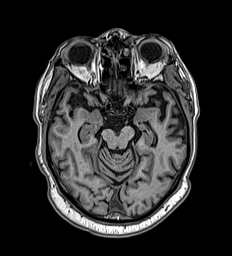
[im 96/160]
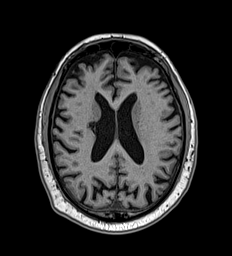
[im 128/160]
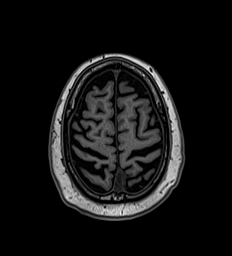
[im 160/160]
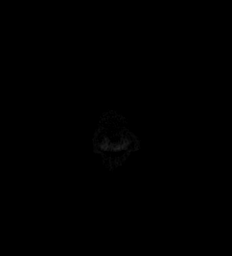

[Series 46: T2 · coronal · 5.0mm · 0.45mm/px · 1 of 31 slices shown (6 of 6)]
[im 1/31]
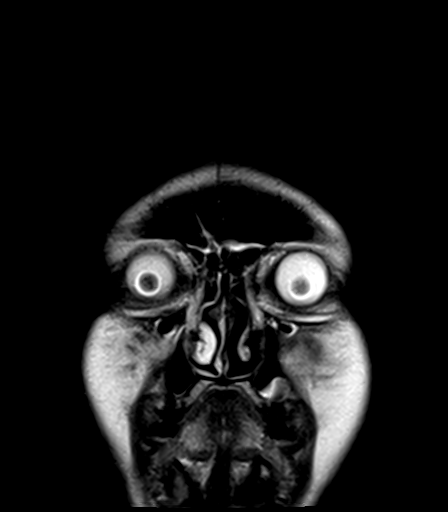

[Series 47: T1 fat-sat post-contrast · sagittal · 3.0mm · 1.41mm/px · 1 of 20 slices shown (1 of 2)]
[im 1/20]
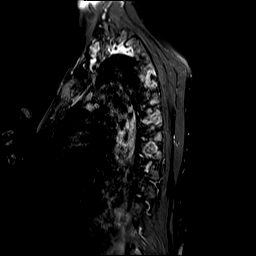

[Series 48: T1 post-contrast · axial · 4.0mm · 0.37mm/px · z∈[-342,-74]mm · 2 of 39 slices shown (2 of 6)]
[im 1/39]
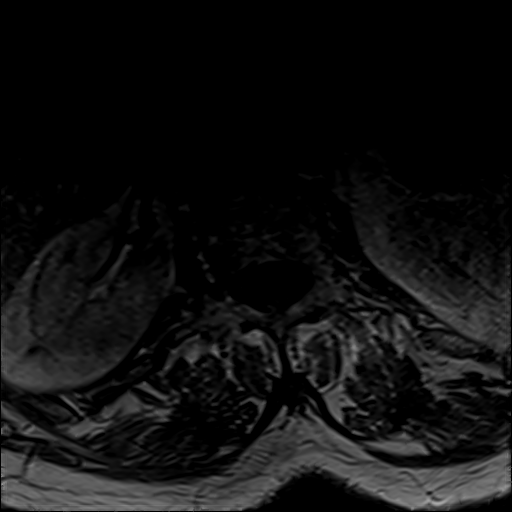
[im 39/39]
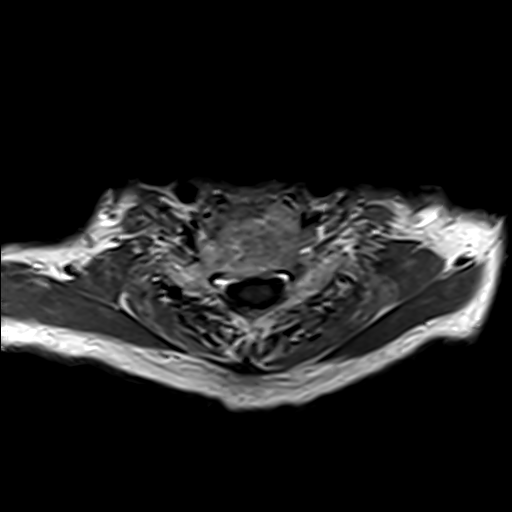

[Series 49: T1 fat-sat post-contrast · sagittal · 4.0mm · 1.02mm/px · 1 of 18 slices shown (2 of 2)]
[im 1/18]
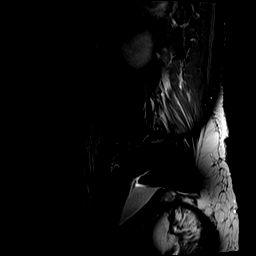

[Series 50: T1 post-contrast · axial · 4.0mm · 0.39mm/px · 1 of 34 slices shown (3 of 6)]
[im 1/34]
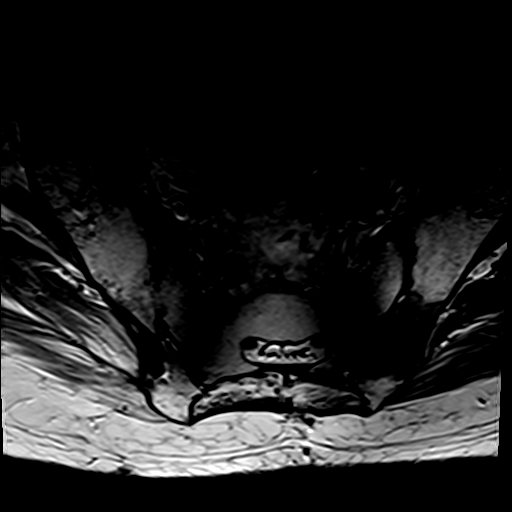

[Series 51: T1 post-contrast · axial · 1.0mm · 0.98mm/px · z∈[+47,+206]mm · 6 of 160 slices shown (4 of 6)]
[im 1/160]
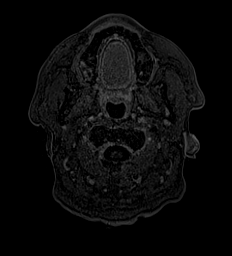
[im 32/160]
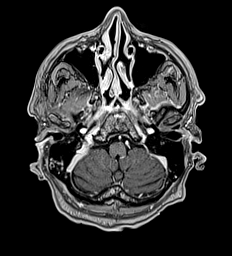
[im 64/160]
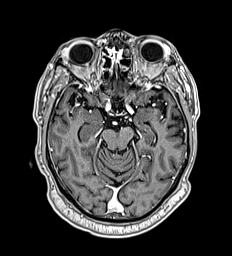
[im 96/160]
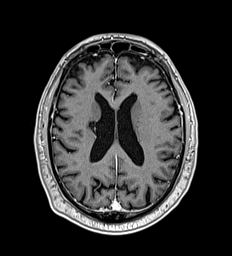
[im 128/160]
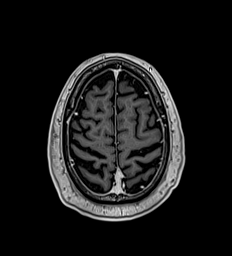
[im 160/160]
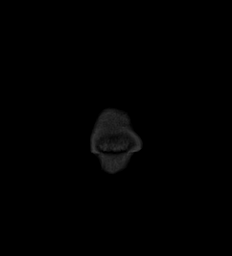

[Series 52: T1 post-contrast · coronal · 5.0mm · 0.57mm/px · 1 of 29 slices shown (5 of 6)]
[im 1/29]
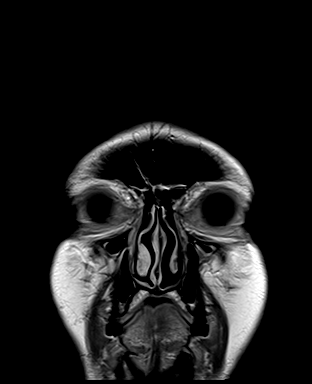

[Series 53: T1 post-contrast · sagittal · 5.0mm · 0.62mm/px · 1 of 23 slices shown (6 of 6)]
[im 1/23]
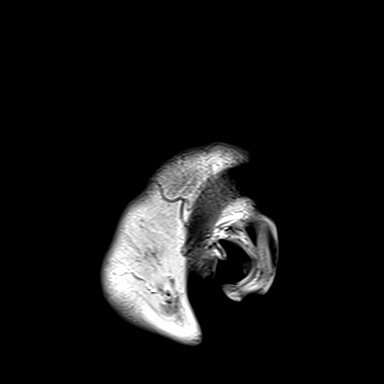

[38 of 48 positions shown; findings below may reference images not displayed]

FINDINGS: Alignment:  Normal

Vertebrae: There are numerous metastatic lesions throughout the
visualized spine. The largest lesion is at T6 where there is
circumferential epidural extension of tumor that effaces the thecal
sac and compresses the spinal cord.

Cord:  No parenchymal abnormality.

Paraspinal and other soft tissues: Negative

Disc levels:

Aside from the epidural abnormality described above, there is no
spinal canal or neural foraminal stenosis.
IMPRESSION: 1. Diffuse osseous metastatic disease with largest lesion at T6.
2. Circumferential epidural extension of tumor at T6-T7 with
compression of the spinal cord.

Critical Value/emergent results were called by telephone at the time
of interpretation on [DATE] at [DATE] to provider SAPARILLA,
who verbally acknowledged these results.

## 2020-12-10 IMAGING — MR MR HEAD WO/W CM
14 series · 44 of 48 positions shown · IV contrast (7.5ml Gadavist)
Comparison: None.

CLINICAL DATA: Lower extremity weakness

EXAM:
MRI HEAD WITHOUT AND WITH CONTRAST
TECHNIQUE: Multiplanar, multiecho pulse sequences of the brain and surrounding
structures were obtained without and with intravenous contrast.
CONTRAST:  7.5mL GADAVIST GADOBUTROL 1 MMOL/ML IV SOLN

[Series 34: ax dwi_tracew · axial · 3.0mm · 0.65mm/px · z∈[+54,+209]mm · 4 of 48 slices shown]
[im 1/48]
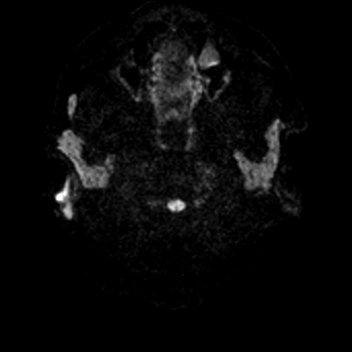
[im 16/48]
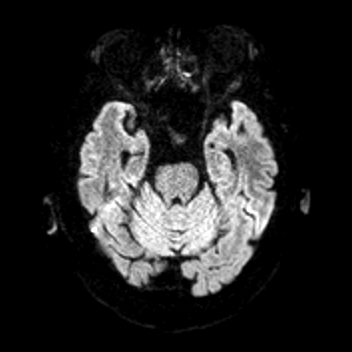
[im 32/48]
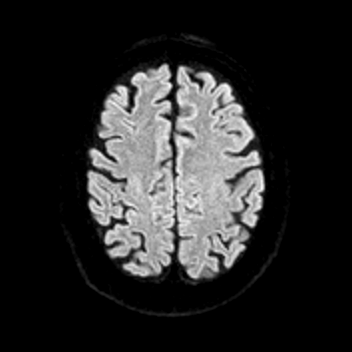
[im 48/48]
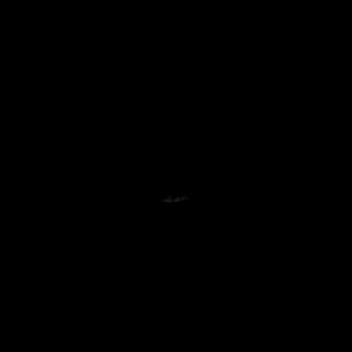

[Series 35: ax dwi_adc · axial · 3.0mm · 0.65mm/px · z∈[+54,+209]mm · 3 of 48 slices shown]
[im 1/48]
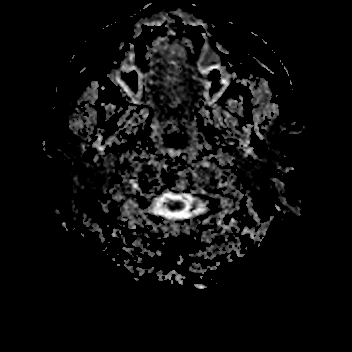
[im 24/48]
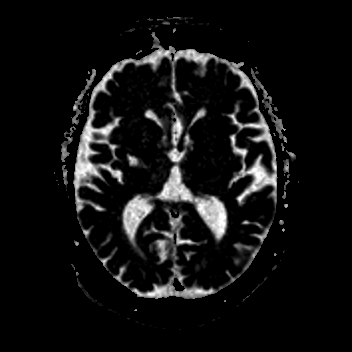
[im 48/48]
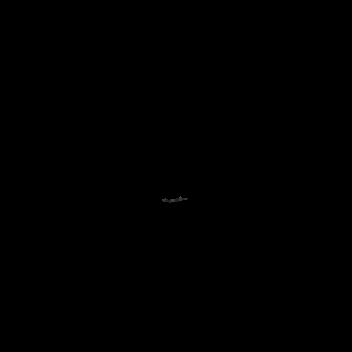

[Series 36: cor dwi_tracew · coronal · 5.0mm · 0.60mm/px · 2 of 38 slices shown]
[im 1/38]
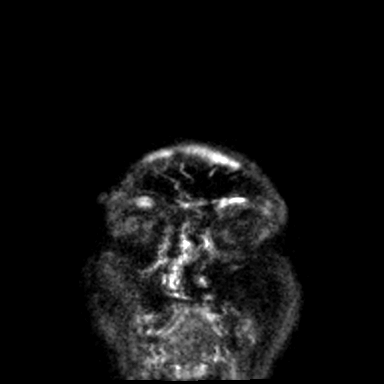
[im 38/38]
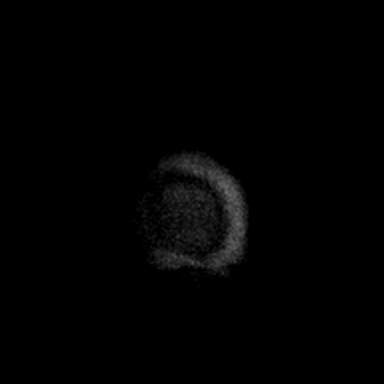

[Series 37: cor dwi_adc · coronal · 5.0mm · 0.60mm/px · 2 of 38 slices shown]
[im 1/38]
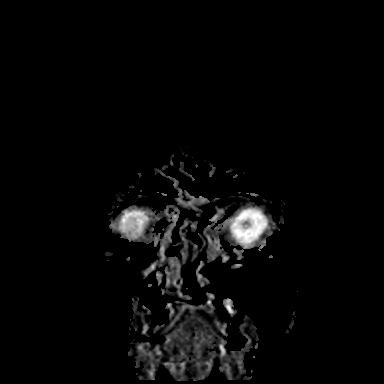
[im 38/38]
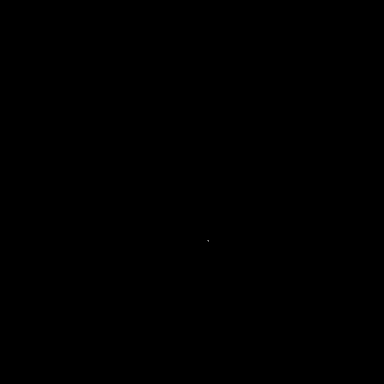

[Series 38: T1 · sagittal · 5.0mm · 0.62mm/px · 1 of 23 slices shown (1 of 2)]
[im 1/23]
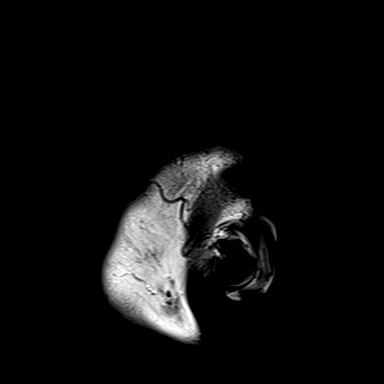

[Series 39: T2 · axial · 5.0mm · 0.45mm/px · z∈[+55,+199]mm · 2 of 25 slices shown (1 of 2)]
[im 1/25]
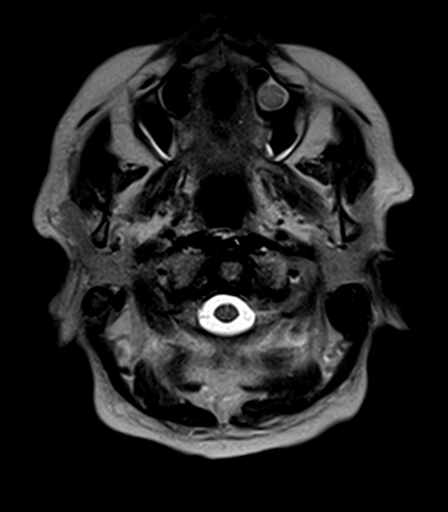
[im 25/25]
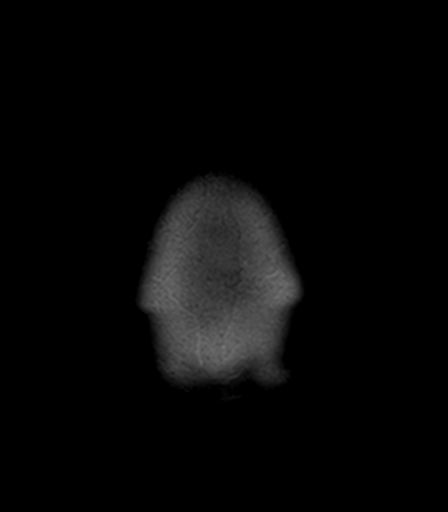

[Series 41: pha_images · axial · 3.0mm · 0.90mm/px · z∈[+51,+204]mm · 3 of 52 slices shown]
[im 1/52]
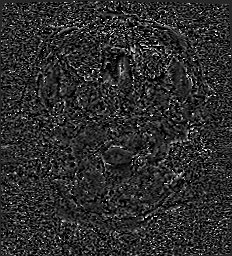
[im 26/52]
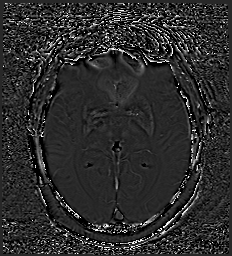
[im 52/52]
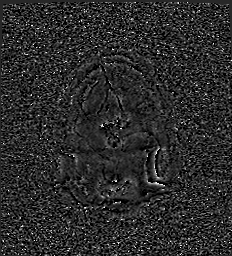

[Series 42: swi_images · axial · 3.0mm · 0.90mm/px · z∈[+51,+204]mm · 3 of 52 slices shown]
[im 1/52]
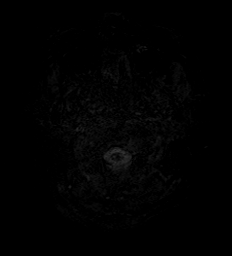
[im 26/52]
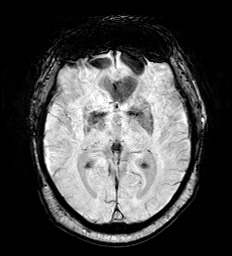
[im 52/52]
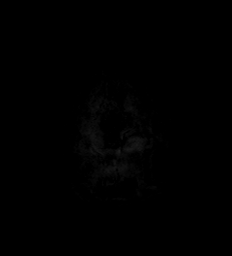

[Series 44: FLAIR · axial · 3.0mm · 0.53mm/px · z∈[+54,+200]mm · 3 of 50 slices shown]
[im 1/50]
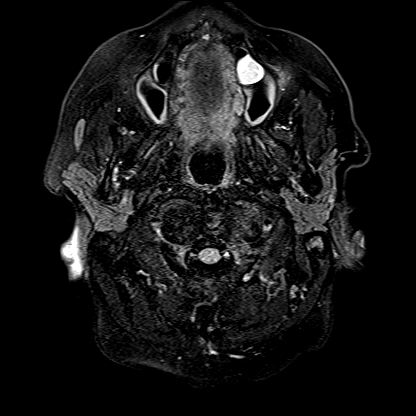
[im 25/50]
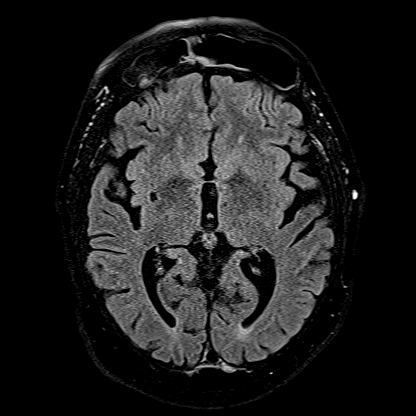
[im 50/50]
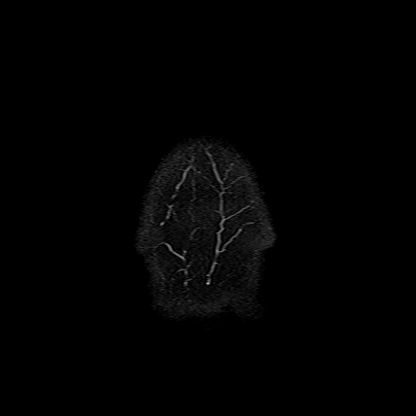

[Series 45: T1 · axial · 1.0mm · 0.98mm/px · z∈[+47,+206]mm · 8 of 160 slices shown (2 of 2)]
[im 1/160]
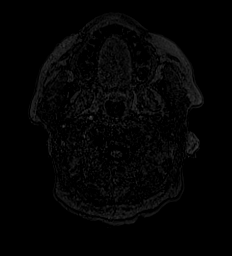
[im 18/160]
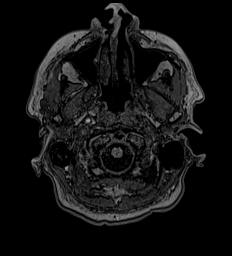
[im 54/160]
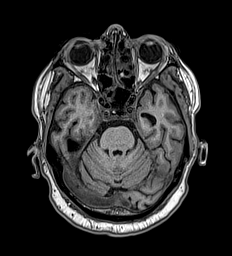
[im 71/160]
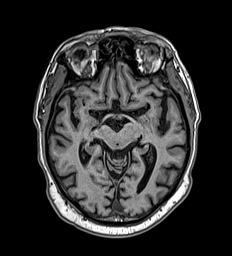
[im 89/160]
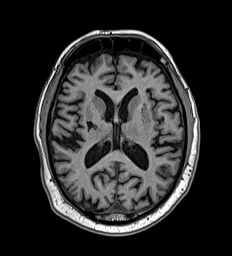
[im 107/160]
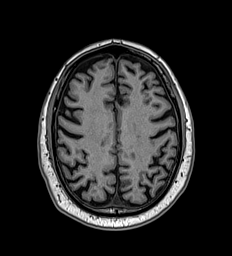
[im 142/160]
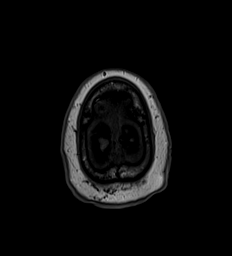
[im 160/160]
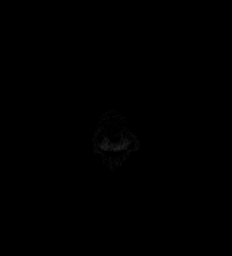

[Series 46: T2 · coronal · 5.0mm · 0.45mm/px · 2 of 31 slices shown (2 of 2)]
[im 1/31]
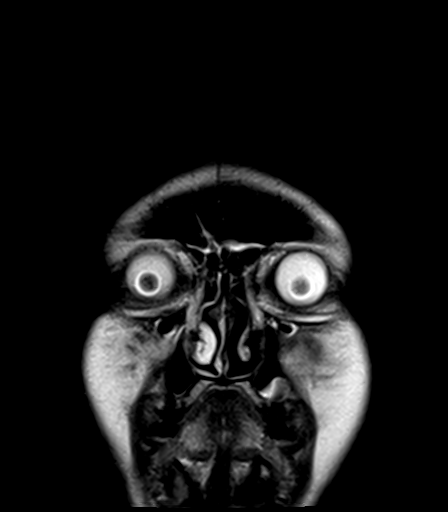
[im 31/31]
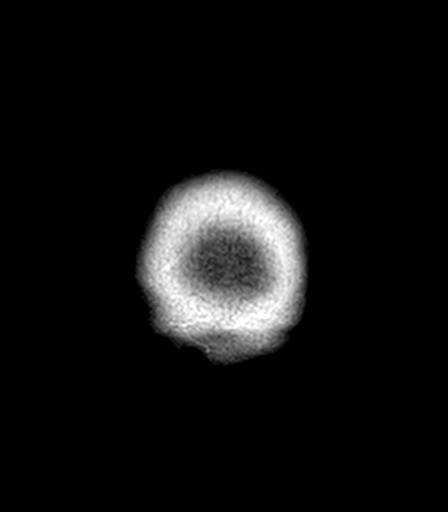

[Series 51: T1 post-contrast · axial · 1.0mm · 0.98mm/px · z∈[+47,+206]mm · 8 of 160 slices shown (1 of 3)]
[im 1/160]
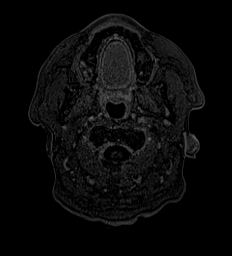
[im 18/160]
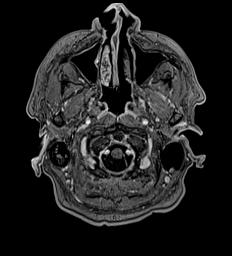
[im 54/160]
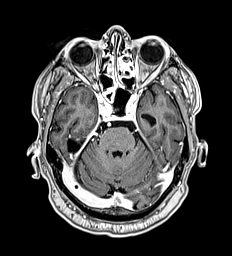
[im 71/160]
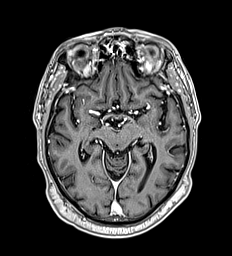
[im 89/160]
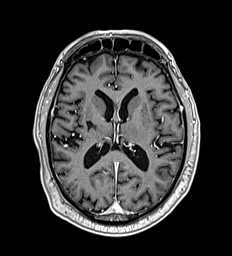
[im 107/160]
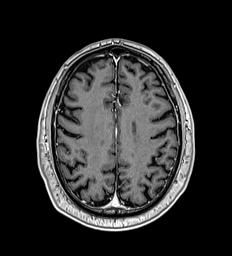
[im 142/160]
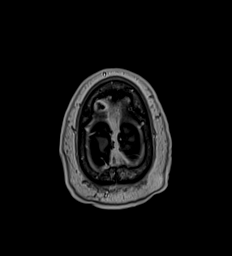
[im 160/160]
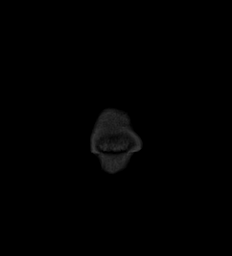

[Series 52: T1 post-contrast · coronal · 5.0mm · 0.57mm/px · 2 of 29 slices shown (2 of 3)]
[im 1/29]
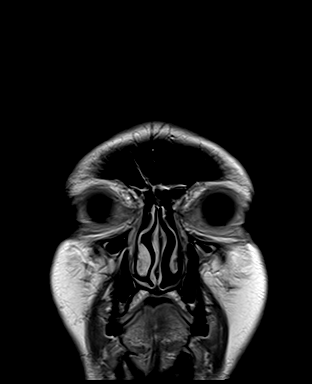
[im 29/29]
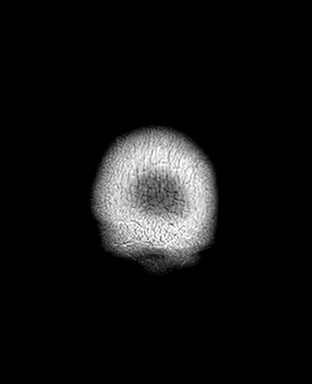

[Series 53: T1 post-contrast · sagittal · 5.0mm · 0.62mm/px · 1 of 23 slices shown (3 of 3)]
[im 1/23]
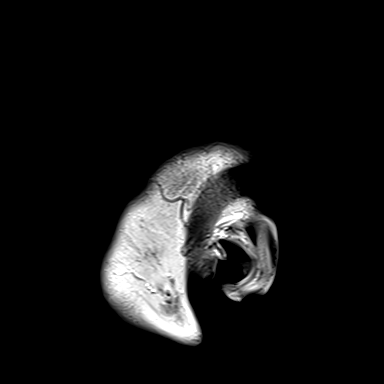

[44 of 48 positions shown; findings below may reference images not displayed]

FINDINGS: Brain: No acute infarct, mass effect or extra-axial collection. No
acute or chronic hemorrhage. There is multifocal hyperintense
T2-weighted signal within the white matter. Generalized volume loss
without a clear lobar predilection. The midline structures are
normal. There is no abnormal contrast enhancement. Old right basal
ganglia small vessel infarct.

Vascular: Major flow voids are preserved.

Skull and upper cervical spine: Normal calvarium and skull base.
Visualized upper cervical spine and soft tissues are normal.

Sinuses/Orbits:No paranasal sinus fluid levels or advanced mucosal
thickening. No mastoid or middle ear effusion. Normal orbits.
IMPRESSION: 1. No acute intracranial or intracranial metastatic disease.
2. Multifocal hyperintense T2-weighted signal within the white
matter, most consistent with chronic microvascular ischemia.
3. Generalized volume loss without a clear lobar predilection.

## 2020-12-10 IMAGING — CT CT ABD-PELV W/O CM
2 of 4 series · 16 of 46 positions shown, 18 images · non-contrast
Comparison: [DATE].

CLINICAL DATA: Abdominal distension.

EXAM:
CT ABDOMEN AND PELVIS WITHOUT CONTRAST
TECHNIQUE: Multidetector CT imaging of the abdomen and pelvis was performed
following the standard protocol without IV contrast.

[Series 2: routine abd/(person_name) (person_name) · axial · 0.80mm/px · z∈[-525,-130]mm · 13 of 87 slices shown, 15 images]
[im 4/87  soft-tissue]
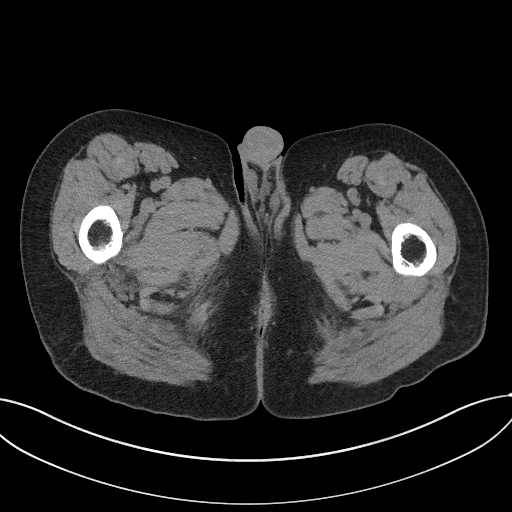
[im 4/87  bone]
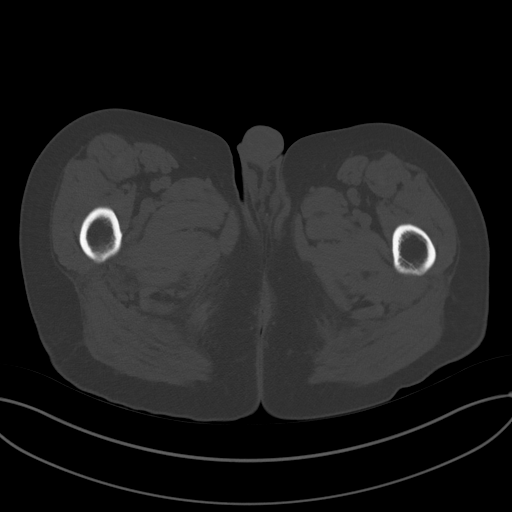
[im 10/87  soft-tissue]
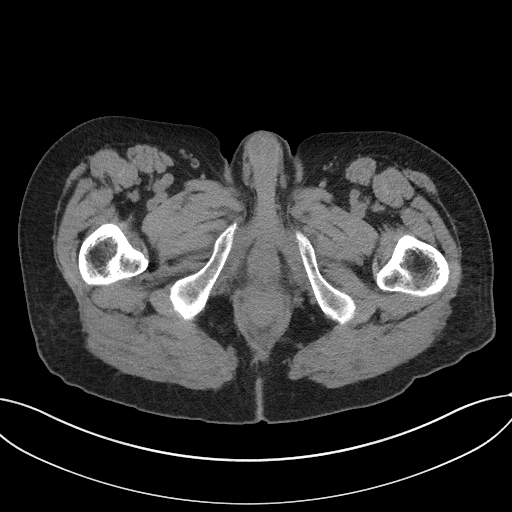
[im 17/87  soft-tissue]
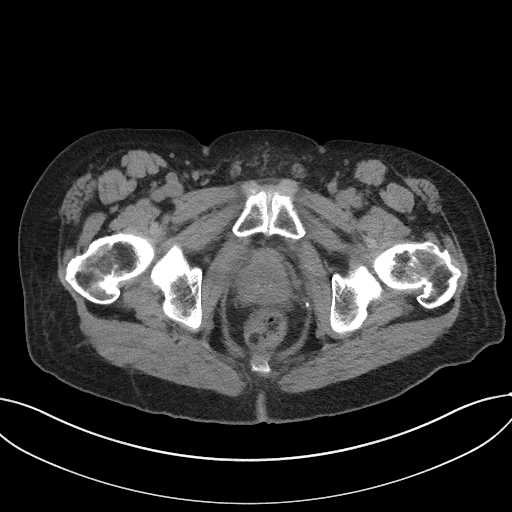
[im 24/87  soft-tissue]
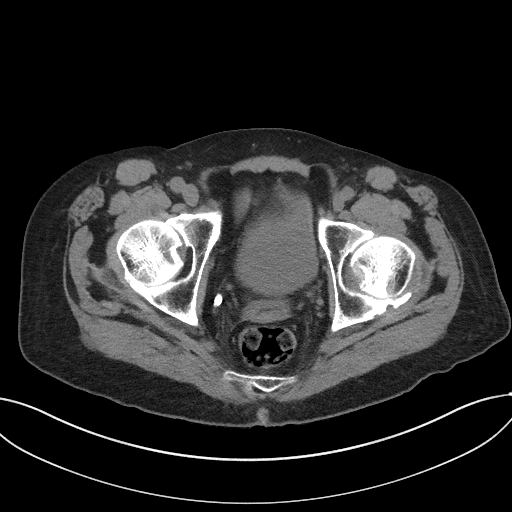
[im 30/87  soft-tissue]
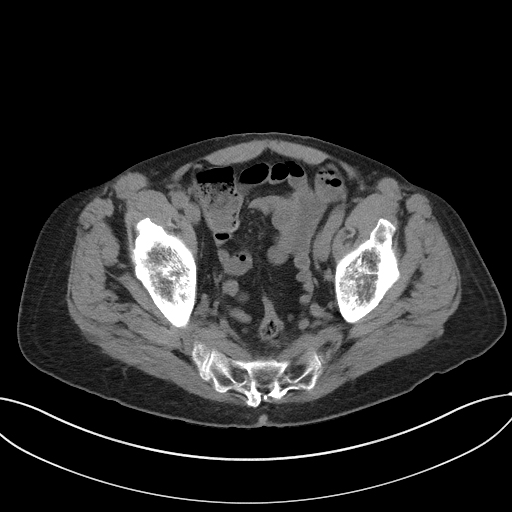
[im 37/87  soft-tissue]
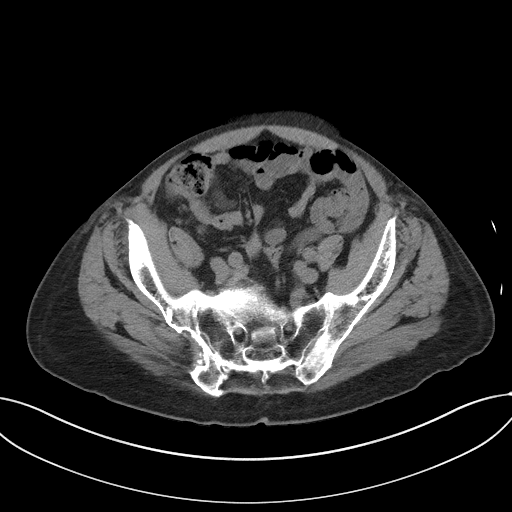
[im 44/87  soft-tissue]
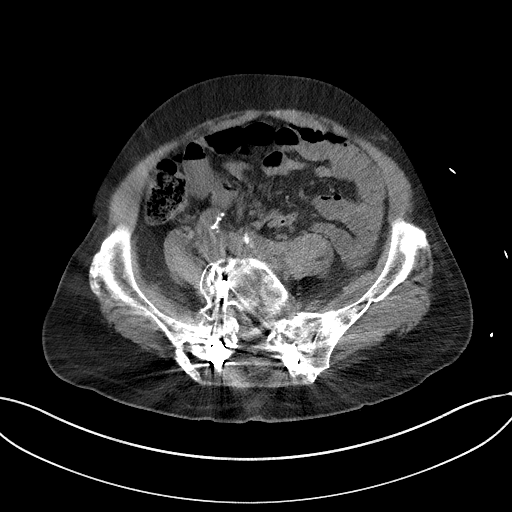
[im 50/87  soft-tissue]
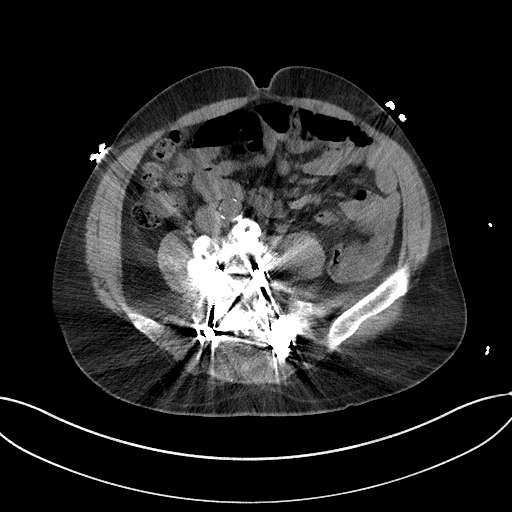
[im 57/87  soft-tissue]
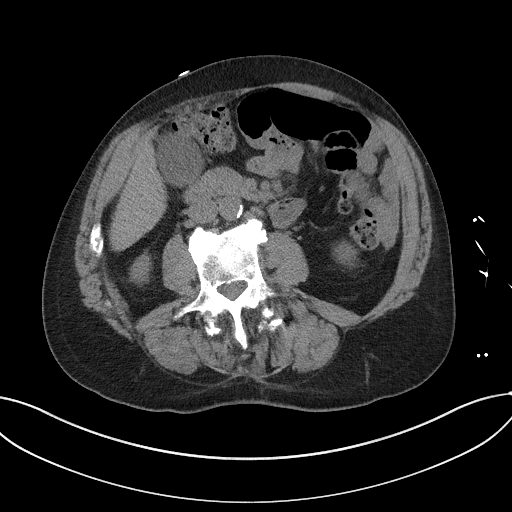
[im 57/87  bone]
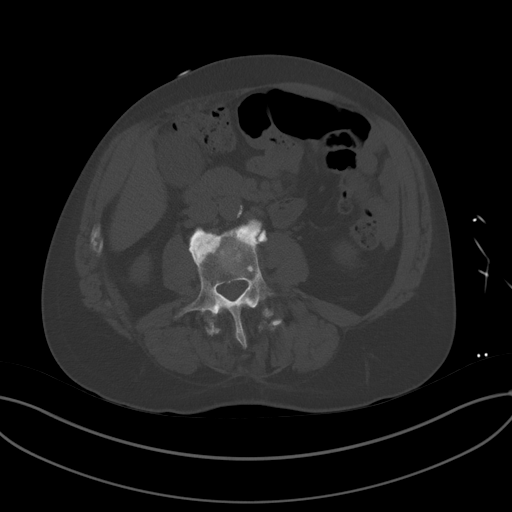
[im 63/87  soft-tissue]
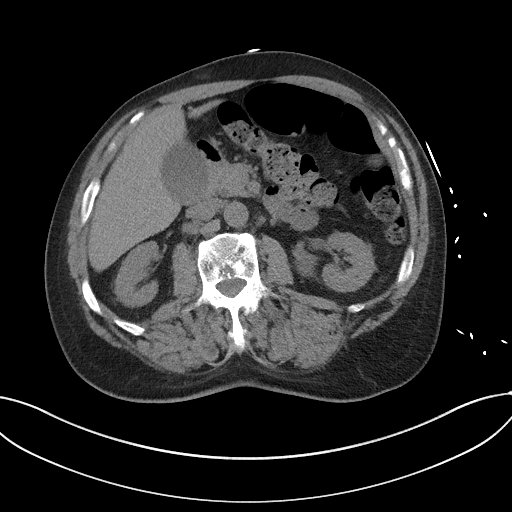
[im 70/87  soft-tissue]
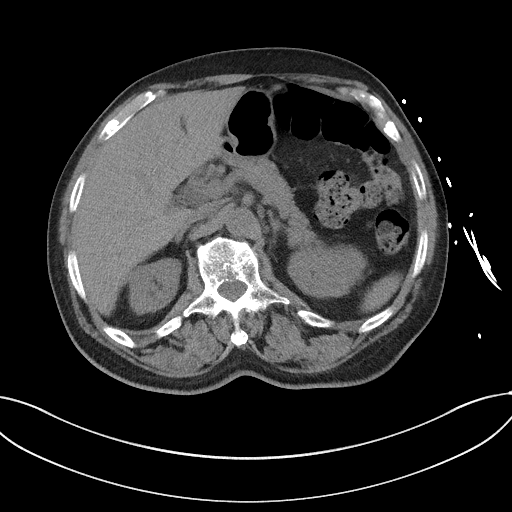
[im 77/87  soft-tissue]
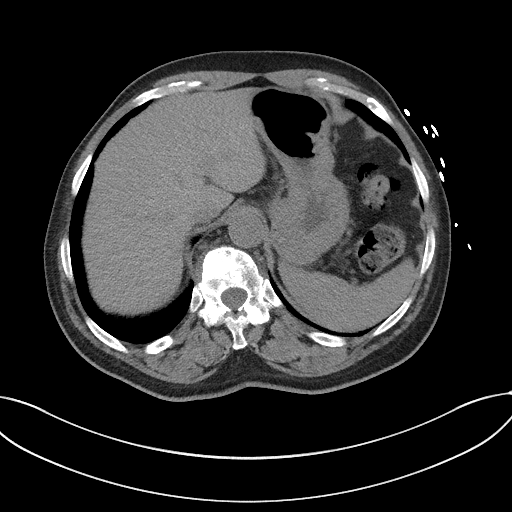
[im 83/87  soft-tissue]
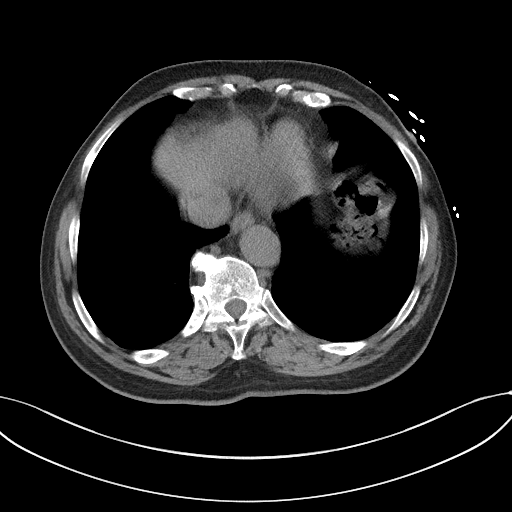

[Series 5: coronal st · coronal · 0.73mm/px · 3 of 102 slices shown]
[im 34/102  soft-tissue]
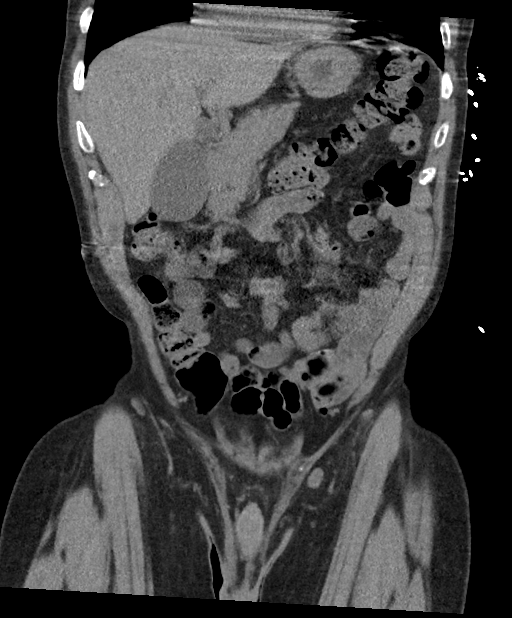
[im 45/102  soft-tissue]
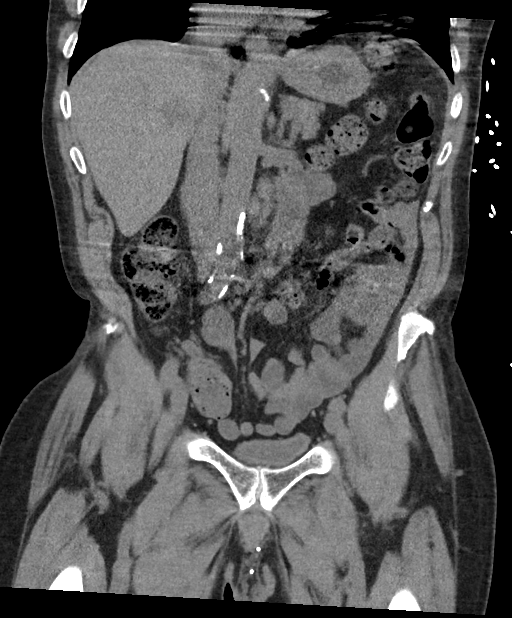
[im 57/102  soft-tissue]
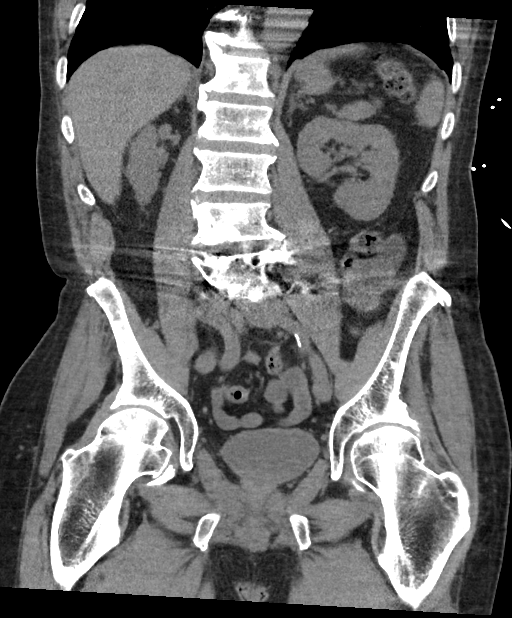

[16 of 46 positions shown; findings below may reference images not displayed]

FINDINGS: Lower chest: No acute abnormality.

Hepatobiliary: No focal liver abnormality is seen. No gallstones,
gallbladder wall thickening, or biliary dilatation.

Pancreas: Unremarkable. No pancreatic ductal dilatation or
surrounding inflammatory changes.

Spleen: Normal in size without focal abnormality.

Adrenals/Urinary Tract: Adrenal glands are unremarkable. Kidneys are
normal, without renal calculi, focal lesion, or hydronephrosis.
Bladder is unremarkable.

Stomach/Bowel: The stomach appears normal. There is no evidence of
bowel obstruction or inflammation.

Vascular/Lymphatic: Aortic atherosclerosis. No enlarged abdominal or
pelvic lymph nodes.

Reproductive: Stable prostatic calcifications.

Other: No abdominal wall hernia or abnormality. No abdominopelvic
ascites.

Musculoskeletal: Status post surgical posterior fusion of L4-5 and
L5-S1 with bilateral intrapedicular screw placement. Increased
abnormal sclerotic density is noted in T12 vertebral body concerning
for possible metastatic disease.
IMPRESSION: Increased abnormal sclerotic density is noted in T12 vertebral body
concerning for possible metastatic disease.

No other significant abnormality seen in the abdomen or pelvis.

Aortic Atherosclerosis ([U2]-[U2]).

## 2020-12-10 NOTE — ED Notes (Signed)
Patient transported to MRI 

## 2020-12-10 NOTE — ED Triage Notes (Signed)
Hx prostate/bone cancer with [facility] (couldn't get someone to answer). Pt c/o abdominal swelling this morning with increasing weakness since last wed. Pt normally walks with cane and now unable to walk. Denies abnormal vomiting (usually vomits 3 times a week), took miralax this morning with gas production (lbm 2days), morphine also was started last thurs/fri, denies fevers.

## 2020-12-10 NOTE — ED Provider Notes (Signed)
[facility] Emergency Department Provider Note  ____________________________________________   Event Date/Time   First MD Initiated Contact with Patient 12/10/20 1715     (approximate)  I have reviewed the triage vital signs and the nursing notes.   HISTORY  Chief Complaint Abdominal Pain and Weakness    HPI Terry Mitchell is a 70 y.o. male with prostate cancer with mets to the bone who comes in with leg weakness and abdominal pain.  Patient states over the past few days he feels like he feels really weak and like he cannot walk.  This is worse with ambulation, better at rest.  Denies any new pain in his legs but just states that they feel they are going to give out he feels very wobbly.  Patient required 3 people to help get him into bed.  When laying in bed patient can move his legs really well.  Patient reports chronic pain on his chest and around his back and states that this has not changed at all.  No new urinary incontinence.  Patient is on hospice and is not getting any other treatments for the cancer.  However he tried to call his hospice team and he could not get a hold of them.  He states that he wonders if it could be related to the morphine that was started a few days ago.  He states that he last took it yesterday but he continues to have his symptoms.  He reports abdominal swelling and discomfort as well.          Past Medical History:  Diagnosis Date  . Depression    recently went on disability d/t diagnosis  . History of recent fall 01/2018   missed the last step on ladder, causing back discomfort  . Hypertension   . Prostate cancer ([place]) 12/2017   prostate    Patient Active Problem List   Diagnosis Date Noted  . Stage 3b chronic kidney disease ([place]) 04/06/2020  . Encounter for antineoplastic chemotherapy 04/06/2020  . Hot flash in male 07/27/2019  . Prostate cancer metastatic to bone ([place]) 05/15/2019  . Prostate cancer ([place])  03/19/2018  . Androgen deprivation therapy 03/19/2018  . PSA elevation 03/19/2018  . Goals of care, counseling/discussion 03/19/2018  . Chest pain 02/20/2018    Past Surgical History:  Procedure Laterality Date  . BACK SURGERY  2001    2 rods and 6 screws in back  . COLONOSCOPY WITH PROPOFOL N/A 09/24/2019   Procedure: COLONOSCOPY WITH PROPOFOL;  Surgeon: Jonathon Bellows, MD;  Location: [facility] ENDOSCOPY;  Service: Gastroenterology;  Laterality: N/A;  . Left shoulder surgery Left 1998   removed a piece of bone around collar bone  . PROSTATE BIOPSY N/A 02/28/2018   Procedure: PROSTATE BIOPSY;  Surgeon: Abbie Sons, MD;  Location: [facility] ORS;  Service: Urology;  Laterality: N/A;  . TRANSRECTAL ULTRASOUND N/A 02/28/2018   Procedure: TRANSRECTAL ULTRASOUND;  Surgeon: Abbie Sons, MD;  Location: [facility] ORS;  Service: Urology;  Laterality: N/A;    Prior to Admission medications   Medication Sig Start Date End Date Taking? Authorizing Provider  albuterol (VENTOLIN HFA) 108 (90 Base) MCG/ACT inhaler Inhale 2 puffs into the lungs every 6 (six) hours as needed for wheezing or shortness of breath. Patient not taking: Reported on 09/13/2020 05/22/19   [name], MD  amLODipine (NORVASC) 5 MG tablet Take 1 tablet (5 mg total) by mouth daily. 06/26/19   [name], MD  atorvastatin (LIPITOR)  40 MG tablet  03/23/20   [provider]  loratadine (CLARITIN) 10 MG tablet Take 10 mg by mouth daily. In prep for fulphila    [provider]  losartan (COZAAR) 50 MG tablet Take 50 mg by mouth daily after breakfast.  03/06/18   [provider]  morphine (MS CONTIN) 15 MG 12 hr tablet Take 1 tablet (15 mg total) by mouth every 12 (twelve) hours. 12/02/20   Borders, Kirt Boys, NP  ondansetron (ZOFRAN) 8 MG tablet Take 1 tablet (8 mg total) by mouth every 8 (eight) hours as needed for nausea or vomiting. 09/13/20   Borders, Kirt Boys, NP  oxyCODONE-acetaminophen (PERCOCET) 5-325 MG tablet Take 1-2  tablets by mouth every 4 (four) hours as needed for severe pain. 12/09/20 12/09/21  Borders, Kirt Boys, NP  vitamin B-12 (CYANOCOBALAMIN) 1000 MCG tablet Take 1 tablet (1,000 mcg total) by mouth daily. 05/15/19   [name], MD  prochlorperazine (COMPAZINE) 10 MG tablet Take 1 tablet (10 mg total) by mouth every 6 (six) hours as needed (Nausea or vomiting). Patient not taking: Reported on 11/02/2019 05/01/19 04/08/20  [name], MD    Allergies Penicillins and Lactose intolerance (gi)  Family History  Problem Relation Age of Onset  . Stroke Mother   . Kidney failure Mother   . Diabetes Maternal Aunt     Social History Social History   Tobacco Use  . Smoking status: Former Smoker    Packs/day: 2.00    Types: Cigars    Quit date: 07/24/2019    Years since quitting: 1.3  . Smokeless tobacco: Never Used  . Tobacco comment: 2 cigars  Vaping Use  . Vaping Use: Never used  Substance Use Topics  . Alcohol use: Yes    Alcohol/week: 9.0 standard drinks    Types: 9 Cans of beer per week    Comment: occasional  . Drug use: Yes    Types: Cocaine, "Crack" cocaine    Comment: none within the year      Review of Systems Constitutional: No fever/chills Eyes: No visual changes. ENT: No sore throat. Cardiovascular: Denies chest pain. Respiratory: Denies shortness of breath. Gastrointestinal: Abdominal discomfort and swelling. Genitourinary: Negative for dysuria. Musculoskeletal: Negative for back pain. Skin: Negative for rash. Neurological: Negative for headaches, focal weakness or numbness.  Wobbly legs All other ROS negative ____________________________________________   PHYSICAL EXAM:  VITAL SIGNS: Blood pressure (!) 163/82, pulse (!) 59, temperature 98.8 F (37.1 C), temperature source Oral, resp. rate (!) 21, height 6' (1.829 m), weight 90.3 kg, SpO2 100 %.   Constitutional: Alert and oriented. Well appearing and in no acute distress elderly male  Eyes: Conjunctivae are normal.  EOMI. Head: Atraumatic. Nose: No congestion/rhinnorhea. Mouth/Throat: Mucous membranes are moist.   Neck: No stridor. Trachea Midline. FROM Cardiovascular: Normal rate, regular rhythm. Grossly normal heart sounds.  Good peripheral circulation. Respiratory: Normal respiratory effort.  No retractions. Lungs CTAB. Gastrointestinal: Soft with slightly distended abdomen. no abdominal bruits.  Musculoskeletal: No lower extremity tenderness nor edema.  No joint effusions.  Able to lift both legs up off the bed and bend them in the air Neurologic:  Normal speech and language. No gross focal neurologic deficits are appreciated.  Skin:  Skin is warm, dry and intact. No rash noted. Psychiatric: Mood and affect are normal. Speech and behavior are normal. GU: Deferred  No CTL spine tenderness that patient reports is new ____________________________________________   LABS (all labs ordered are listed, but only  abnormal results are displayed)  Labs Reviewed  CBC WITH DIFFERENTIAL/PLATELET - Abnormal; Notable for the following components:      Result Value   RBC 3.66 (*)    Hemoglobin 12.4 (*)    HCT 37.8 (*)    MCV 103.3 (*)    All other components within normal limits  RESP PANEL BY RT-PCR (FLU A&B, COVID) ARPGX2  COMPREHENSIVE METABOLIC PANEL  LIPASE, BLOOD  URINALYSIS, COMPLETE (UACMP) WITH MICROSCOPIC  TROPONIN I (HIGH SENSITIVITY)   ____________________________________________   ED ECG REPORT I, Vanessa Souderton, the attending physician, personally viewed and interpreted this ECG.  Normal sinus rate of 67, no ST elevation, no T wave inversions, normal intervals ____________________________________________  RADIOLOGY   Official radiology report(s): CT ABDOMEN PELVIS WO CONTRAST  Result Date: 12/10/2020 CLINICAL DATA:  Abdominal distension. EXAM: CT ABDOMEN AND PELVIS WITHOUT CONTRAST TECHNIQUE: Multidetector CT imaging of the abdomen and pelvis was performed following the standard  protocol without IV contrast. COMPARISON:  April 28, 2019. FINDINGS: Lower chest: No acute abnormality. Hepatobiliary: No focal liver abnormality is seen. No gallstones, gallbladder wall thickening, or biliary dilatation. Pancreas: Unremarkable. No pancreatic ductal dilatation or surrounding inflammatory changes. Spleen: Normal in size without focal abnormality. Adrenals/Urinary Tract: Adrenal glands are unremarkable. Kidneys are normal, without renal calculi, focal lesion, or hydronephrosis. Bladder is unremarkable. Stomach/Bowel: The stomach appears normal. There is no evidence of bowel obstruction or inflammation. Vascular/Lymphatic: Aortic atherosclerosis. No enlarged abdominal or pelvic lymph nodes. Reproductive: Stable prostatic calcifications. Other: No abdominal wall hernia or abnormality. No abdominopelvic ascites. Musculoskeletal: Status post surgical posterior fusion of L4-5 and L5-S1 with bilateral intrapedicular screw placement. Increased abnormal sclerotic density is noted in T12 vertebral body concerning for possible metastatic disease. IMPRESSION: Increased abnormal sclerotic density is noted in T12 vertebral body concerning for possible metastatic disease. No other significant abnormality seen in the abdomen or pelvis. Aortic Atherosclerosis (ICD10-I70.0). Electronically Signed   By: Marijo Conception M.D.   On: 12/10/2020 18:36   CT Head Wo Contrast  Result Date: 12/10/2020 CLINICAL DATA:  Dizziness. EXAM: CT HEAD WITHOUT CONTRAST TECHNIQUE: Contiguous axial images were obtained from the base of the skull through the vertex without intravenous contrast. COMPARISON:  May 30, 2017. FINDINGS: Brain: Old lacunar infarction is noted in right basal ganglia. No mass effect or midline shift is noted. Ventricular size is within normal limits. There is no evidence of mass lesion, hemorrhage or acute infarction. Vascular: No hyperdense vessel or unexpected calcification. Skull: Normal. Negative for  fracture or focal lesion. Sinuses/Orbits: Right sphenoid sinusitis is noted. Other: None. IMPRESSION: No acute intracranial abnormality seen.  Right sphenoid sinusitis. Electronically Signed   By: Marijo Conception M.D.   On: 12/10/2020 18:29    ____________________________________________   PROCEDURES  Procedure(s) performed (including Critical Care):  .1-3 Lead EKG Interpretation Performed by: Vanessa Afton, MD Authorized by: Vanessa Claiborne, MD     Interpretation: normal     ECG rate:  70s   ECG rate assessment: normal     Rhythm: sinus rhythm     Ectopy: none     Conduction: normal       ____________________________________________   INITIAL IMPRESSION / ASSESSMENT AND PLAN / ED COURSE  Terry Mitchell was evaluated in Emergency Department on 12/10/2020 for the symptoms described in the history of present illness. He was evaluated in the context of the global COVID-19 pandemic, which necessitated consideration that the patient might be at risk for infection  with the SARS-CoV-2 virus that causes COVID-19. Institutional protocols and algorithms that pertain to the evaluation of patients at risk for COVID-19 are in a state of rapid change based on information released by regulatory bodies including the CDC and federal and state organizations. These policies and algorithms were followed during the patient's care in the ED.    Patient comes in with weakness in the legs.  He definitely be related to the morphine that patient was on but will get labs to evaluate for electrolyte abnormalities, AKI.  Also discussed with family that it could be related to progression of his cancer.  We discussed whether or not they would want imaging to look at this and they stated that they would even though they would not want any additional treatment they stated that knowing that it had spread there would be beneficial for them.  We will get CT head to see if there is a large mass and CT abdomen to make sure  there is no other causes for distention such as SBO.  Does not appear to be a fracture given he is able to lift up both legs very well.  We will keep patient on cardiac monitor.  Denies any significant pain at this time.  Patient CT scan concerning for lesion on his T-spine.  Patient has no postvoid residual after urinating.  The patient does report some tingling sensation in his bilateral legs.  Will get MRI to evaluate for cord compression as well as MRI brain to evaluate for large mass.  Patient states that if there was an intervention that needed to be done to help him walk again that he would be willing to undergo an intervention.    Patient handed off to oncoming team pending MRI and ambulation trial         ____________________________________________   FINAL CLINICAL IMPRESSION(S) / ED DIAGNOSES   Final diagnoses:  Weakness  Prostate cancer ([place])      MEDICATIONS GIVEN DURING THIS VISIT:  Medications - No data to display   ED Discharge Orders    None       Note:  This document was prepared using Dragon voice recognition software and may include unintentional dictation errors.   Vanessa Chokio, MD 12/11/20 [phone]

## 2020-12-10 NOTE — ED Notes (Addendum)
Bladder scan complete, pt able to fully empty with no retained urine on bladder scan. Dr. Jari Pigg notified.

## 2020-12-11 ENCOUNTER — Encounter: Payer: Self-pay | Admitting: Radiation Oncology

## 2020-12-11 ENCOUNTER — Inpatient Hospital Stay (HOSPITAL_COMMUNITY)
Admission: AD | Admit: 2020-12-11 | Discharge: 2020-12-20 | DRG: 543 | Disposition: A | Discharge status: Rehabilitation Facility | Source: Other Acute Inpatient Hospital | Attending: Family Medicine | Admitting: Family Medicine

## 2020-12-11 ENCOUNTER — Encounter (HOSPITAL_COMMUNITY): Payer: Self-pay | Admitting: Family Medicine

## 2020-12-11 ENCOUNTER — Ambulatory Visit
Admit: 2020-12-11 | Discharge: 2020-12-11 | Disposition: A | Discharge status: Home / Self Care | Payer: Medicare HMO | Attending: Radiation Oncology | Admitting: Radiation Oncology

## 2020-12-11 ENCOUNTER — Ambulatory Visit: Payer: Medicare HMO | Admitting: Radiation Oncology

## 2020-12-11 ENCOUNTER — Other Ambulatory Visit: Payer: Self-pay

## 2020-12-11 DIAGNOSIS — C7951 Secondary malignant neoplasm of bone: Secondary | ICD-10-CM | POA: Diagnosis not present

## 2020-12-11 DIAGNOSIS — I10 Essential (primary) hypertension: Secondary | ICD-10-CM | POA: Diagnosis present

## 2020-12-11 DIAGNOSIS — Z823 Family history of stroke: Secondary | ICD-10-CM

## 2020-12-11 DIAGNOSIS — G9520 Unspecified cord compression: Secondary | ICD-10-CM | POA: Diagnosis present

## 2020-12-11 DIAGNOSIS — G992 Myelopathy in diseases classified elsewhere: Secondary | ICD-10-CM | POA: Diagnosis present

## 2020-12-11 DIAGNOSIS — G893 Neoplasm related pain (acute) (chronic): Secondary | ICD-10-CM

## 2020-12-11 DIAGNOSIS — R296 Repeated falls: Secondary | ICD-10-CM | POA: Diagnosis present

## 2020-12-11 DIAGNOSIS — T380X5A Adverse effect of glucocorticoids and synthetic analogues, initial encounter: Secondary | ICD-10-CM | POA: Diagnosis present

## 2020-12-11 DIAGNOSIS — Z841 Family history of disorders of kidney and ureter: Secondary | ICD-10-CM

## 2020-12-11 DIAGNOSIS — Z87891 Personal history of nicotine dependence: Secondary | ICD-10-CM | POA: Diagnosis not present

## 2020-12-11 DIAGNOSIS — Z923 Personal history of irradiation: Secondary | ICD-10-CM

## 2020-12-11 DIAGNOSIS — G8222 Paraplegia, incomplete: Secondary | ICD-10-CM | POA: Diagnosis not present

## 2020-12-11 DIAGNOSIS — C7949 Secondary malignant neoplasm of other parts of nervous system: Secondary | ICD-10-CM | POA: Diagnosis present

## 2020-12-11 DIAGNOSIS — Z880 Allergy status to penicillin: Secondary | ICD-10-CM

## 2020-12-11 DIAGNOSIS — Z7189 Other specified counseling: Secondary | ICD-10-CM | POA: Diagnosis not present

## 2020-12-11 DIAGNOSIS — E785 Hyperlipidemia, unspecified: Secondary | ICD-10-CM | POA: Diagnosis present

## 2020-12-11 DIAGNOSIS — G8220 Paraplegia, unspecified: Secondary | ICD-10-CM | POA: Diagnosis present

## 2020-12-11 DIAGNOSIS — Z20822 Contact with and (suspected) exposure to covid-19: Secondary | ICD-10-CM | POA: Diagnosis present

## 2020-12-11 DIAGNOSIS — C61 Malignant neoplasm of prostate: Secondary | ICD-10-CM | POA: Diagnosis present

## 2020-12-11 DIAGNOSIS — Z9221 Personal history of antineoplastic chemotherapy: Secondary | ICD-10-CM | POA: Diagnosis not present

## 2020-12-11 DIAGNOSIS — Z515 Encounter for palliative care: Secondary | ICD-10-CM | POA: Diagnosis not present

## 2020-12-11 DIAGNOSIS — R739 Hyperglycemia, unspecified: Secondary | ICD-10-CM | POA: Diagnosis present

## 2020-12-11 DIAGNOSIS — M21372 Foot drop, left foot: Secondary | ICD-10-CM | POA: Diagnosis not present

## 2020-12-11 LAB — COMPREHENSIVE METABOLIC PANEL
ALT: 14 U/L (ref 0–44)
AST: 22 U/L (ref 15–41)
Albumin: 4.1 g/dL (ref 3.5–5.0)
Alkaline Phosphatase: 153 U/L — ABNORMAL HIGH (ref 38–126)
Anion gap: 8 (ref 5–15)
BUN: 21 mg/dL (ref 8–23)
CO2: 27 mmol/L (ref 22–32)
Calcium: 9.8 mg/dL (ref 8.9–10.3)
Chloride: 102 mmol/L (ref 98–111)
Creatinine, Ser: 1.25 mg/dL — ABNORMAL HIGH (ref 0.61–1.24)
GFR, Estimated: >60 mL/min (ref >60)
Glucose, Bld: 125 mg/dL — ABNORMAL HIGH (ref 70–99)
Potassium: 4.8 mmol/L (ref 3.5–5.1)
Sodium: 137 mmol/L (ref 135–145)
Total Bilirubin: 0.7 mg/dL (ref 0.3–1.2)
Total Protein: 7 g/dL (ref 6.5–8.1)

## 2020-12-11 LAB — CBC
HCT: 41.2 % (ref 39.0–52.0)
Hemoglobin: 13.2 g/dL (ref 13.0–17.0)
MCH: 34 pg (ref 26.0–34.0)
MCHC: 32 g/dL (ref 30.0–36.0)
MCV: 106.2 fL — ABNORMAL HIGH (ref 80.0–100.0)
Platelets: 179 10*3/uL (ref 150–400)
RBC: 3.88 MIL/uL — ABNORMAL LOW (ref 4.22–5.81)
RDW: 13.2 % (ref 11.5–15.5)
WBC: 4.7 10*3/uL (ref 4.0–10.5)
nRBC: 0 % (ref 0.0–0.2)

## 2020-12-11 LAB — CREATININE, SERUM
Creatinine, Ser: 1.18 mg/dL (ref 0.61–1.24)
GFR, Estimated: >60 mL/min (ref >60)

## 2020-12-11 LAB — MAGNESIUM: Magnesium: 2.2 mg/dL (ref 1.7–2.4)

## 2020-12-11 LAB — HIV ANTIBODY (ROUTINE TESTING W REFLEX): HIV Screen 4th Generation wRfx: NONREACTIVE

## 2020-12-11 LAB — PHOSPHORUS: Phosphorus: 3.8 mg/dL (ref 2.5–4.6)

## 2020-12-11 MED ORDER — MORPHINE SULFATE ER 15 MG PO TBCR
15.0000 mg | EXTENDED_RELEASE_TABLET | Freq: Two times a day (BID) | ORAL | Status: DC
  Administered 2020-12-11 – 2020-12-20 (×19): 15 mg via ORAL
  Filled 2020-12-11 (×18): qty 1

## 2020-12-11 MED ORDER — ONDANSETRON HCL 4 MG/2ML IJ SOLN: 4.0000 mg | Freq: Four times a day (QID) | INTRAMUSCULAR | Status: DC | PRN

## 2020-12-11 MED ORDER — DEXAMETHASONE SODIUM PHOSPHATE 10 MG/ML IJ SOLN
10.0000 mg | Freq: Once | INTRAMUSCULAR | Status: AC
  Administered 2020-12-11: 10 mg via INTRAVENOUS
  Filled 2020-12-11: qty 1

## 2020-12-11 MED ORDER — OXYCODONE HCL 5 MG PO TABS
5.0000 mg | ORAL_TABLET | ORAL | Status: DC | PRN
  Administered 2020-12-11 – 2020-12-14 (×5): 5 mg via ORAL
  Filled 2020-12-11 (×5): qty 1

## 2020-12-11 MED ORDER — ONDANSETRON HCL 4 MG PO TABS: 4.0000 mg | ORAL_TABLET | Freq: Four times a day (QID) | ORAL | Status: DC | PRN

## 2020-12-11 MED ORDER — ACETAMINOPHEN 650 MG RE SUPP
650.0000 mg | Freq: Four times a day (QID) | RECTAL | Status: DC | PRN
Start: 2020-12-11 — End: 2020-12-20

## 2020-12-11 MED ORDER — VITAMIN B-12 1000 MCG PO TABS
1000.0000 ug | ORAL_TABLET | Freq: Every day | ORAL | Status: DC
  Administered 2020-12-11 – 2020-12-20 (×10): 1000 ug via ORAL
  Filled 2020-12-11 (×10): qty 1

## 2020-12-11 MED ORDER — OXYCODONE-ACETAMINOPHEN 5-325 MG PO TABS
1.0000 | ORAL_TABLET | Freq: Once | ORAL | Status: AC
  Administered 2020-12-11: 1 via ORAL
  Filled 2020-12-11: qty 1

## 2020-12-11 MED ORDER — ENOXAPARIN SODIUM 40 MG/0.4ML IJ SOSY
40.0000 mg | PREFILLED_SYRINGE | Freq: Every day | INTRAMUSCULAR | Status: DC
  Administered 2020-12-11 – 2020-12-20 (×10): 40 mg via SUBCUTANEOUS
  Filled 2020-12-11 (×10): qty 0.4

## 2020-12-11 MED ORDER — ACETAMINOPHEN 325 MG PO TABS
650.0000 mg | ORAL_TABLET | Freq: Four times a day (QID) | ORAL | Status: DC | PRN
  Administered 2020-12-11 – 2020-12-15 (×9): 650 mg via ORAL
  Filled 2020-12-11 (×9): qty 2

## 2020-12-11 MED ORDER — DEXAMETHASONE SODIUM PHOSPHATE 4 MG/ML IJ SOLN
4.0000 mg | Freq: Four times a day (QID) | INTRAMUSCULAR | Status: DC
  Administered 2020-12-11 – 2020-12-15 (×18): 4 mg via INTRAVENOUS
  Filled 2020-12-11 (×18): qty 1

## 2020-12-11 MED ORDER — GADOBUTROL 1 MMOL/ML IV SOLN
7.5000 mL | Freq: Once | INTRAVENOUS | Status: AC | PRN
  Administered 2020-12-11: 7.5 mL via INTRAVENOUS

## 2020-12-11 MED ORDER — POLYETHYLENE GLYCOL 3350 17 G PO PACK
17.0000 g | PACK | Freq: Two times a day (BID) | ORAL | Status: DC
  Administered 2020-12-11 – 2020-12-19 (×5): 17 g via ORAL
  Filled 2020-12-11 (×12): qty 1

## 2020-12-11 MED ORDER — OXYCODONE HCL 5 MG PO TABS
10.0000 mg | ORAL_TABLET | ORAL | Status: DC | PRN
  Administered 2020-12-12 – 2020-12-13 (×2): 10 mg via ORAL
  Filled 2020-12-11 (×2): qty 2

## 2020-12-11 NOTE — ED Provider Notes (Signed)
  Physical Exam  BP (!) 163/82   Pulse (!) 59   Temp 98.8 F (37.1 C) (Oral)   Resp (!) 21   Ht 6' (1.829 m)   Wt 90.3 kg   SpO2 100%   BMI 27.00 kg/m   Physical Exam  ED Course/Procedures     Procedures  MDM  12:00 AM  Assumed care.  Patient is a 70 year old male with history of metastatic prostate cancer who recently started morphine for pain who presents with weakness.  Unable to ambulate due to weakness.  Work-up has been reassuring other than CT of the abdomen pelvis which showed a increased abnormal sclerotic density in the T12 vertebral body.  Plan is to obtain MRI of the brain, T and L-spine.  If these show no acute abnormality, will ambulate to determine disposition.   1:44 AM  Spoke with radiologist Dr. Collins Scotland.  He states that patient has diffuse osseous metastasis to his spine.  He has a T6 epidural soft tissue tumor that is compressing his spinal cord.  On my examination, patient reports that he has numbness from his mid abdomen down into both legs.  He has weakness in both legs but is able to raise them up against minimal resistance.  He reports he normally ambulates without any assistance but since Wednesday he has had to hold onto things to walk.  No bowel or bladder incontinence or urinary retention.  He states that he is on palliative care for the past 2 months because he no longer wanted to proceed with chemotherapy or radiation.  He states he would however be interested in surgery, radiation or other treatments needed to improve quality of life and allow him to be able to ambulate again.  He states that he thinks he still has several years to live and wants to have quality of life.  States that he just purchased a motorcycle.  1:57 AM  Spoke with Dr. Izora Ribas with [facility] who has reviewed patient's imaging.  He recommends admission to the hospitalist service with oncology and radiation oncology consultation in the morning.  He states given patient is on palliative care,  he would not be a good surgical candidate.  Recommends urgent XRT for patient.  If not available here at [facility] urgently, recommends transfer to [place], [facility] or [facility].  Recommends giving steroids.  I have ordered 10 mg of IV Decadron.  2:21 AM  Spoke with Dr. Tasia Catchings on-call for oncology who is managing this patient.  She recommends transfer.  She states that radiation oncology is not on-call here for weekend coverage.  There is no radiation oncologist listed in [place].  Patient is comfortable with transfer to [facility] if they have bed availability.  Will discuss with radiation oncology first prior to discussion with medicine.  2:37 AM  Spoke with Dr. Lisbeth Renshaw on call with radiation oncology at [facility].  He agrees with plan for urgent XRT at [facility] and agrees with medicine admission.  2:53 AM  Spoke with Dr. Kipp Brood on call for hospitalist service.  He agrees to admission.  There are medical beds available at [facility].  Patient updated with plan.  I reviewed all nursing notes and pertinent previous records as available.  I have reviewed and interpreted any EKGs, lab and urine results, imaging (as available).    Evelette Hollern, Delice Bison, DO 12/11/20 [phone]

## 2020-12-11 NOTE — Consult Note (Signed)
D/w Dr. Leonides Schanz.   70 yo male with known metastatic prostate CA on hospice.  Presents now with weakness and found to have MESCC with T6 lesion.  He has previously being counseled for and agreed to hospice care in March 2022. Given the lack of control of his systemic disease, invasive surgical care is not a reasonable consideration. To consider surgical treatment, adjunctive care such as chemotherapy is required.  - Consider high dose steroids - Consider XRT to the thoracic lesion. Would consult med and radiation oncology. Will need admission given difficulty with walking. - Palliative care/hospice per prior care.  Meade Maw MD

## 2020-12-11 NOTE — ED Notes (Signed)
Patient ambulatory to the restroom with walker. Very weak and needs x1 assist.

## 2020-12-11 NOTE — ED Notes (Signed)
Per MD, no need to ambulate patient at this time due to abnormal MRI results. Patient requesting pain medication.

## 2020-12-11 NOTE — Progress Notes (Signed)
[facility] [address] [facility])  [facility] Liaison Note   Terry Mitchell is a current hospice patient with [facility], admitted on 10/17/20 with a terminal diagnosis of prostate adenocarcinoma with metastases to bladder, bone and lymph.  Pt had reported to his hospice home care team increased BLE weakness and difficulty ambulating which he attributed to a new prescription for MS Contin.  Pt reports attempting to call [facility] on call on Friday evening to report increased difficulty ambulating but being unable to reach anyone.  Pt transported to [facility] for evaluation where he was diagnosed with spinal cord compression at T6 and transferred to [facility] where he was admitted for palliative radiation.  Per Dr. Jewel Baize, [facility] MD, this is a related admission provided the radiation is palliative in scope.  Visited the patient at bedside.  Pt was AOx4, at baseline, denied pain. Pt reports receiving Oxy IR 55m prior to this RN's visit which relieved 5/10 pain.  Pt reported sensation of numbness from mid abdomen to feet, reported increased weakness with increased difficulty ambulating this week.  Pt shared frustration over loss of ability, shared hope that he would be able to ride his motorcycle after this admission.  Attempted to elicit goals of care.  Pt stated he had been told he would receive 10 radiation treatments.  Advised that this extensive course of radiation may not be consistent with the hospice plan of care but that if pt desire aggressive treatment he could revoke hospice benefit to pursue that.  Pt shared that he had appreciated the care from his hospice team but that he would want to pursue aggressive treatment if offered.  Plan made to review MD notes tomorrow to verify offered treatment plans and to reassess after initial radiation treatment.    Pt is eligible for inpatient status due to need for diagnostic evaluations and ongoing IV steroid therapy. Vital Signs: 98.5  oral, 142/95, 57 HR, 16 RR, 100% RA  I&O: not charted Abnormal Labs: Alk phos 153 Diagnostics:  CT ABDOMEN PELVIS WO CONTRAST   Result Date: 12/10/2020 CLINICAL DATA:  Abdominal distension. EXAM: CT ABDOMEN AND PELVIS WITHOUT CONTRAST TECHNIQUE: Multidetector CT imaging of the abdomen and pelvis was performed following the standard protocol without IV contrast. COMPARISON:  April 28, 2019. FINDINGS: Lower chest: No acute abnormality. Hepatobiliary: No focal liver abnormality is seen. No gallstones, gallbladder wall thickening, or biliary dilatation. Pancreas: Unremarkable. No pancreatic ductal dilatation or surrounding inflammatory changes. Spleen: Normal in size without focal abnormality. Adrenals/Urinary Tract: Adrenal glands are unremarkable. Kidneys are normal, without renal calculi, focal lesion, or hydronephrosis. Bladder is unremarkable. Stomach/Bowel: The stomach appears normal. There is no evidence of bowel obstruction or inflammation. Vascular/Lymphatic: Aortic atherosclerosis. No enlarged abdominal or pelvic lymph nodes. Reproductive: Stable prostatic calcifications. Other: No abdominal wall hernia or abnormality. No abdominopelvic ascites. Musculoskeletal: Status post surgical posterior fusion of L4-5 and L5-S1 with bilateral intrapedicular screw placement. Increased abnormal sclerotic density is noted in T12 vertebral body concerning for possible metastatic disease. IMPRESSION: Increased abnormal sclerotic density is noted in T12 vertebral body concerning for possible metastatic disease. No other significant abnormality seen in the abdomen or pelvis. Aortic Atherosclerosis (ICD10-I70.0). Electronically Signed   By: JMarijo ConceptionM.D.   On: 12/10/2020 18:36    CT Head Wo Contrast   Result Date: 12/10/2020 CLINICAL DATA:  Dizziness. EXAM: CT HEAD WITHOUT CONTRAST TECHNIQUE: Contiguous axial images were obtained from the base of the skull through the vertex without  intravenous contrast.  COMPARISON:  May 30, 2017. FINDINGS: Brain: Old lacunar infarction is noted in right basal ganglia. No mass effect or midline shift is noted. Ventricular size is within normal limits. There is no evidence of mass lesion, hemorrhage or acute infarction. Vascular: No hyperdense vessel or unexpected calcification. Skull: Normal. Negative for fracture or focal lesion. Sinuses/Orbits: Right sphenoid sinusitis is noted. Other: None. IMPRESSION: No acute intracranial abnormality seen.  Right sphenoid sinusitis. Electronically Signed   By: Marijo Conception M.D.   On: 12/10/2020 18:29    MR Brain W and Wo Contrast   Result Date: 12/11/2020 CLINICAL DATA:  Lower extremity weakness EXAM: MRI HEAD WITHOUT AND WITH CONTRAST TECHNIQUE: Multiplanar, multiecho pulse sequences of the brain and surrounding structures were obtained without and with intravenous contrast. CONTRAST:  7.767m GADAVIST GADOBUTROL 1 MMOL/ML IV SOLN COMPARISON:  None. FINDINGS: Brain: No acute infarct, mass effect or extra-axial collection. No acute or chronic hemorrhage. There is multifocal hyperintense T2-weighted signal within the white matter. Generalized volume loss without a clear lobar predilection. The midline structures are normal. There is no abnormal contrast enhancement. Old right basal ganglia small vessel infarct. Vascular: Major flow voids are preserved. Skull and upper cervical spine: Normal calvarium and skull base. Visualized upper cervical spine and soft tissues are normal. Sinuses/Orbits:No paranasal sinus fluid levels or advanced mucosal thickening. No mastoid or middle ear effusion. Normal orbits. IMPRESSION: 1. No acute intracranial or intracranial metastatic disease. 2. Multifocal hyperintense T2-weighted signal within the white matter, most consistent with chronic microvascular ischemia. 3. Generalized volume loss without a clear lobar predilection. Electronically Signed   By: KUlyses JarredM.D.   On: 12/11/2020 01:38    MR  THORACIC SPINE W WO CONTRAST   Result Date: 12/11/2020 CLINICAL DATA:  Difficulty walking. EXAM: MRI THORACIC WITHOUT AND WITH CONTRAST TECHNIQUE: Multiplanar and multiecho pulse sequences of the thoracic spine were obtained without and with intravenous contrast. CONTRAST:  7.538mGADAVIST GADOBUTROL 1 MMOL/ML IV SOLN COMPARISON:  None. FINDINGS: Alignment:  Normal Vertebrae: There are numerous metastatic lesions throughout the visualized spine. The largest lesion is at T6 where there is circumferential epidural extension of tumor that effaces the thecal sac and compresses the spinal cord. Cord:  No parenchymal abnormality. Paraspinal and other soft tissues: Negative Disc levels: Aside from the epidural abnormality described above, there is no spinal canal or neural foraminal stenosis. IMPRESSION: 1. Diffuse osseous metastatic disease with largest lesion at T6. 2. Circumferential epidural extension of tumor at T6-T7 with compression of the spinal cord. Critical Value/emergent results were called by telephone at the time of interpretation on 12/11/2020 at 1:38 am to [facility] these results. Electronically Signed   By: KeUlyses Jarred.D.   On: 12/11/2020 01:39    MR Lumbar Spine W Wo Contrast   Result Date: 12/11/2020 CLINICAL DATA:  Shortness of breath and lower extremity swelling EXAM: MRI LUMBAR SPINE WITHOUT AND WITH CONTRAST TECHNIQUE: Multiplanar and multiecho pulse sequences of the lumbar spine were obtained without and with intravenous contrast. CONTRAST:  7.67m59mADAVIST GADOBUTROL 1 MMOL/ML IV SOLN COMPARISON:  None. FINDINGS: Segmentation:  Standard Alignment:  Posterior fusion at L4-S1 Vertebrae: Osseous metastases at all levels. The largest lesion is at T12. No pathologic fracture. Conus medullaris and cauda equina: Conus extends to the L1 level. Conus and cauda equina appear normal. Paraspinal and other soft tissues: Negative Disc levels: Limited assessment of  the lower lumbar spinal canal due to magnetic  susceptibility effects from spinal hardware. No spinal canal or neural foraminal stenosis of the upper lumbar spine. IMPRESSION: 1. Diffuse lumbar osseous metastatic disease without pathologic fracture. 2. No spinal canal or neural foraminal stenosis of the upper lumbar spine. Electronically Signed   By: Ulyses Jarred M.D.   On: 12/11/2020 01:33            IV/PRN's: decadron 26m PIV q6h, MS Contin 15 mg BID, Tylenol 650 mg q6h PRN mild pain x 2 doses, Oxy IR 542mq4h PRN moderate-severe pain x 2 doses   Problem List- . Metastatic Prostate Cancer with Compression of Spinal Cord Numbness, difficulty ambulating Imaging notable for circumferential epidural extension of tumor at T6-7 with compression of spinal cord (diffuse osseous metastatic disease with largest lesion at T6 -- L spine imaging with diffuse lumbar osseous metastatic disease without pathologic fracture -- MRI brain without acute intracranial on intracranial metastatic disease) Per Dr. YaJoretta Bachelorrom nsgy, recommended considering high dose steroids, radiation oncology/medical oncology consults as well as palliative care (invasive surgical care not reasonable consideration given lack of control of systemic disease) Steroids, pain control Radiation oncology c/s Consulted Dr. KaIrene Limborom oncology who will see him 5/23   . Hypertension Hold cozar and amlodipine   . HLD Hold statin    . Goals of care Expressed desire for full code whe I was talking to him, will request palliative care consult to help clarify as this would not be c/w hospice philosophy.  Appreciate palliative care and hospice assistance.    Discharge Planning:   Return home Family Contact: [place]'s request [place]  IDT: updated   Goals of Care:  Full code.   Pt clear that he wishes for full scope of treatment with the goal of regaining ability to ambulate.  Unclear at this time if offered treatment is  consistent with hospice plan of care.  Will continue to discuss.  Would appreciate Palliative consult to help clarify realistic plan.   Should patient need ambulance transfer at discharge or transfer please use [facility] as they contract this service for our active Hospice patients.    Thank you for the opportunity to participate in this patient's care.  ChDomenic MorasBSN, RN [facility] ([facility])    [phone] on call)

## 2020-12-11 NOTE — Progress Notes (Addendum)
[employer] [facility])     Addendum: 11:04am:  Per Dr. Gilford Rile, [facility] MD, this is a related admission, and palliative radiation would be consistent with the hospice plan of care.    This patient is a current hospice patient with [facility], admitted 10/17/20 with a terminal diagnosis of prostate adenocarcinoma with mets to bladder, bone and lymph.  [facility] MD has been consulted to determine if palliative radiation would be considered part of the hospice plan of care.  Awaiting return call at this time.  Thank you for the opportunity to participate in this patient's care.     Domenic Moras, BSN, RN [facility] Liaison [phone] (24h on call)

## 2020-12-11 NOTE — ED Notes (Signed)
ED Provider at bedside with update. 

## 2020-12-11 NOTE — Progress Notes (Signed)
Received call regarding patient's involvement at T6 with impending spinal cord compression. Appears appropriate for palliative XRT to this area. Discussed transfer to [facility], and beginning decadron. Will tentatively plan for urgent XRT later today after his arrival.\\------------------------------------------------  Jodelle Gross, MD, PhD

## 2020-12-11 NOTE — ED Notes (Signed)
Paged neurosurgery for Dr. Leonides Schanz

## 2020-12-11 NOTE — H&P (Signed)
History and Physical    Terry Mitchell AYT:[id number] DOB: 09/10/1950 DOA: 12/11/2020  PCP: [name], MD  Patient coming from: home  I have personally briefly reviewed patient's old medical records in [place]  Chief Complaint: difficulty walking, back pain  HPI: Terry Mitchell is Terry Mitchell 70 y.o. male with medical history significant of metastatic prostatic cancer and hypertension as well as depression presenting with increasing difficulty with ambulation.  Terry Mitchell has Terry Mitchell history of meastatic prostate cancer, previously following with Dr. Tasia Catchings.  He had docetaxel x6 and was unable to tolerate xtandi.  He declined additional follow up with Dr. Tasia Catchings after his appointment in 05/06/2020.  He's been followed by hospice.  He notes he's had pain the past 3-4 months.  He's been managed for his pain by hospice.  He notes that morphine was recently added to his pain regiment last Friday.  Things went well for Terry Mitchell few days, then he had difficulty getting up to the bathroom on Wednesday.  His legs felt weak.  Thursday and Friday he felt bloated.  Initially thought it was new pain medicine and stopped morphine (stopped on Thursday), but his symptoms persisted.  He noted progressive worsening with numbness of his abdomen, legs, stomach.  He eventually presented to the ED due to the progression of these symptoms.  He notes 2 weeks ago he was riding Terry Mitchell motorcycle.  Last week he was taking trash to dumpster, can't do that now.    ED Course: imaging concerning for cord compression, discussed with neurosurgery, radiation oncology.  Transfer to [facility], admit to hospitalist.   Review of Systems: As per HPI otherwise all other systems reviewed and are negative.  Past Medical History:  Diagnosis Date  . Depression    recently went on disability d/t diagnosis  . History of recent fall 01/2018   missed the last step on ladder, causing back discomfort  . Hypertension   . Prostate cancer ([place]) 12/2017   prostate     Past Surgical History:  Procedure Laterality Date  . BACK SURGERY  2001    2 rods and 6 screws in back  . COLONOSCOPY WITH PROPOFOL N/Terry Mitchell 09/24/2019   Procedure: COLONOSCOPY WITH PROPOFOL;  Surgeon: Terry Bellows, MD;  Location: [facility] ENDOSCOPY;  Service: Gastroenterology;  Laterality: N/Terry Mitchell;  . Left shoulder surgery Left 1998   removed Terry Mitchell piece of bone around collar bone  . PROSTATE BIOPSY N/Terry Mitchell 02/28/2018   Procedure: PROSTATE BIOPSY;  Surgeon: Terry Sons, MD;  Location: [facility] ORS;  Service: Urology;  Laterality: N/Terry Mitchell;  . TRANSRECTAL ULTRASOUND N/Terry Mitchell 02/28/2018   Procedure: TRANSRECTAL ULTRASOUND;  Surgeon: Terry Sons, MD;  Location: [facility] ORS;  Service: Urology;  Laterality: Terry Mitchell;    Social History  reports that he quit smoking about 16 months ago. His smoking use included cigars. He smoked 2.00 packs per day. He has never used smokeless tobacco. He reports current alcohol use of about 9.0 standard drinks of alcohol per week. He reports current drug use. Drugs: Cocaine and "Crack" cocaine.  Allergies  Allergen Reactions  . Penicillins Hives and Other (See Comments)    Hives, welts, swelling profusely . Pretty severe reaction Has patient had Terry Mitchell PCN reaction causing immediate rash, facial/tongue/throat swelling, SOB or lightheadedness with hypotension: No Has patient had Terry Risinger PCN reaction causing severe rash involving mucus membranes or skin necrosis: Yes Has patient had Terry Mitchell PCN reaction that required hospitalization: Yes Has patient had Terry Mitchell PCN reaction occurring within the  last 10 years: No If all of the above answers are "NO", then may proceed with Cephalosporin use.   . Lactose Intolerance (Gi) Other (See Comments)    Gi upset    Family History  Problem Relation Age of Onset  . Stroke Mother   . Kidney failure Mother   . Diabetes Maternal Aunt     Prior to Admission medications   Medication Sig Start Date End Date Taking? Authorizing Provider  albuterol (VENTOLIN HFA) 108 (90 Base)  MCG/ACT inhaler Inhale 2 puffs into the lungs every 6 (six) hours as needed for wheezing or shortness of breath. 05/22/19   [name], MD  amLODipine (NORVASC) 5 MG tablet Take 1 tablet (5 mg total) by mouth daily. 06/26/19   [name], MD  atorvastatin (LIPITOR) 40 MG tablet Take 40 mg by mouth daily. 03/23/20   [provider]  loratadine (CLARITIN) 10 MG tablet Take 10 mg by mouth daily. In prep for fulphila    [provider]  losartan (COZAAR) 50 MG tablet Take 50 mg by mouth daily after breakfast.  03/06/18   [provider]  morphine (MS CONTIN) 15 MG 12 hr tablet Take 1 tablet (15 mg total) by mouth every 12 (twelve) hours. 12/02/20   Borders, Kirt Boys, NP  ondansetron (ZOFRAN) 8 MG tablet Take 1 tablet (8 mg total) by mouth every 8 (eight) hours as needed for nausea or vomiting. Patient not taking: No sig reported 09/13/20   Borders, Kirt Boys, NP  oxyCODONE-acetaminophen (PERCOCET) 5-325 MG tablet Take 1-2 tablets by mouth every 4 (four) hours as needed for severe pain. 12/09/20 12/09/21  Borders, Kirt Boys, NP  vitamin B-12 (CYANOCOBALAMIN) 1000 MCG tablet Take 1 tablet (1,000 mcg total) by mouth daily. 05/15/19   [name], MD  prochlorperazine (COMPAZINE) 10 MG tablet Take 1 tablet (10 mg total) by mouth every 6 (six) hours as needed (Nausea or vomiting). Patient not taking: Reported on 11/02/2019 05/01/19 04/08/20  [name], MD    Physical Exam: There were no vitals filed for this visit.  Constitutional: NAD, calm, comfortable There were no vitals filed for this visit. Eyes: PERRL, lids and conjunctivae normal ENMT: Mucous membranes are moist. Posterior pharynx clear of any exudate or lesions.Normal dentition.  Neck: normal, supple, no masses, no thyromegaly Respiratory: clear to auscultation bilaterally, no wheezing, no crackles. Normal respiratory effort. No accessory muscle use.  Cardiovascular: Regular rate and rhythm, no murmurs / rubs / gallops. No extremity  edema. Abdomen: no tenderness, no masses palpated. No hepatosplenomegaly. Musculoskeletal: no clubbing / cyanosis. No joint deformity upper and lower extremities. Good ROM, no contractures. Normal muscle tone.  Skin: no rashes, lesions, ulcers. No induration Neurologic: CN 2-12 intact. Numbness below T6 dermatome.  Able to lift heels off bed, L>R (4/5 strength).  5/5 strength to upper extremities.  Psychiatric: Normal judgment and insight. Alert and oriented x 3. Normal mood.   Labs on Admission: I have personally reviewed following labs and imaging studies  CBC: Recent Labs  Lab 12/10/20 1724  WBC 7.3  NEUTROABS 5.7  HGB 12.4*  HCT 37.8*  MCV 103.3*  PLT 703    Basic Metabolic Panel: Recent Labs  Lab 12/10/20 1724  NA 138  K 4.0  CL 102  CO2 25  GLUCOSE 108*  BUN 20  CREATININE 1.35*  CALCIUM 9.8    GFR: Estimated Creatinine Clearance: 57.5 mL/min (Aeryn Medici) (by C-G formula based on SCr of 1.35 mg/dL (H)).  Liver Function  Tests: Recent Labs  Lab 12/10/20 1724  AST 31  ALT 16  ALKPHOS 151*  BILITOT 0.8  PROT 7.4  ALBUMIN 4.2    Urine analysis:    Component Value Date/Time   COLORURINE YELLOW (Thos Matsumoto) 12/10/2020 2201   APPEARANCEUR HAZY (Jaydn Fincher) 12/10/2020 2201   APPEARANCEUR Clear 01/01/2018 1520   LABSPEC 1.028 12/10/2020 2201   PHURINE 5.0 12/10/2020 2201   GLUCOSEU NEGATIVE 12/10/2020 2201   [place] 12/10/2020 2201   BILIRUBINUR NEGATIVE 12/10/2020 2201   BILIRUBINUR Negative 01/01/2018 1520   KETONESUR 5 (Rafan Sanders) 12/10/2020 2201   PROTEINUR NEGATIVE 12/10/2020 2201   NITRITE NEGATIVE 12/10/2020 2201   LEUKOCYTESUR NEGATIVE 12/10/2020 2201    Radiological Exams on Admission: CT ABDOMEN PELVIS WO CONTRAST  Result Date: 12/10/2020 CLINICAL DATA:  Abdominal distension. EXAM: CT ABDOMEN AND PELVIS WITHOUT CONTRAST TECHNIQUE: Multidetector CT imaging of the abdomen and pelvis was performed following the standard protocol without IV contrast. COMPARISON:  April 28, 2019. FINDINGS: Lower chest: No acute abnormality. Hepatobiliary: No focal liver abnormality is seen. No gallstones, gallbladder wall thickening, or biliary dilatation. Pancreas: Unremarkable. No pancreatic ductal dilatation or surrounding inflammatory changes. Spleen: Normal in size without focal abnormality. Adrenals/Urinary Tract: Adrenal glands are unremarkable. Kidneys are normal, without renal calculi, focal lesion, or hydronephrosis. Bladder is unremarkable. Stomach/Bowel: The stomach appears normal. There is no evidence of bowel obstruction or inflammation. Vascular/Lymphatic: Aortic atherosclerosis. No enlarged abdominal or pelvic lymph nodes. Reproductive: Stable prostatic calcifications. Other: No abdominal wall hernia or abnormality. No abdominopelvic ascites. Musculoskeletal: Status post surgical posterior fusion of L4-5 and L5-S1 with bilateral intrapedicular screw placement. Increased abnormal sclerotic density is noted in T12 vertebral body concerning for possible metastatic disease. IMPRESSION: Increased abnormal sclerotic density is noted in T12 vertebral body concerning for possible metastatic disease. No other significant abnormality seen in the abdomen or pelvis. Aortic Atherosclerosis (ICD10-I70.0). Electronically Signed   By: Marijo Conception M.D.   On: 12/10/2020 18:36   CT Head Wo Contrast  Result Date: 12/10/2020 CLINICAL DATA:  Dizziness. EXAM: CT HEAD WITHOUT CONTRAST TECHNIQUE: Contiguous axial images were obtained from the base of the skull through the vertex without intravenous contrast. COMPARISON:  May 30, 2017. FINDINGS: Brain: Old lacunar infarction is noted in right basal ganglia. No mass effect or midline shift is noted. Ventricular size is within normal limits. There is no evidence of mass lesion, hemorrhage or acute infarction. Vascular: No hyperdense vessel or unexpected calcification. Skull: Normal. Negative for fracture or focal lesion. Sinuses/Orbits: Right  sphenoid sinusitis is noted. Other: None. IMPRESSION: No acute intracranial abnormality seen.  Right sphenoid sinusitis. Electronically Signed   By: Marijo Conception M.D.   On: 12/10/2020 18:29   MR Brain W and Wo Contrast  Result Date: 12/11/2020 CLINICAL DATA:  Lower extremity weakness EXAM: MRI HEAD WITHOUT AND WITH CONTRAST TECHNIQUE: Multiplanar, multiecho pulse sequences of the brain and surrounding structures were obtained without and with intravenous contrast. CONTRAST:  7.37mL GADAVIST GADOBUTROL 1 MMOL/ML IV SOLN COMPARISON:  None. FINDINGS: Brain: No acute infarct, mass effect or extra-axial collection. No acute or chronic hemorrhage. There is multifocal hyperintense T2-weighted signal within the white matter. Generalized volume loss without Clara Smolen clear lobar predilection. The midline structures are normal. There is no abnormal contrast enhancement. Old right basal ganglia small vessel infarct. Vascular: Major flow voids are preserved. Skull and upper cervical spine: Normal calvarium and skull base. Visualized upper cervical spine and soft tissues are normal. Sinuses/Orbits:No paranasal sinus fluid  levels or advanced mucosal thickening. No mastoid or middle ear effusion. Normal orbits. IMPRESSION: 1. No acute intracranial or intracranial metastatic disease. 2. Multifocal hyperintense T2-weighted signal within the white matter, most consistent with chronic microvascular ischemia. 3. Generalized volume loss without Leni Pankonin clear lobar predilection. Electronically Signed   By: Ulyses Jarred M.D.   On: 12/11/2020 01:38   MR THORACIC SPINE W WO CONTRAST  Result Date: 12/11/2020 CLINICAL DATA:  Difficulty walking. EXAM: MRI THORACIC WITHOUT AND WITH CONTRAST TECHNIQUE: Multiplanar and multiecho pulse sequences of the thoracic spine were obtained without and with intravenous contrast. CONTRAST:  7.51mL GADAVIST GADOBUTROL 1 MMOL/ML IV SOLN COMPARISON:  None. FINDINGS: Alignment:  Normal Vertebrae: There are numerous  metastatic lesions throughout the visualized spine. The largest lesion is at T6 where there is circumferential epidural extension of tumor that effaces the thecal sac and compresses the spinal cord. Cord:  No parenchymal abnormality. Paraspinal and other soft tissues: Negative Disc levels: Aside from the epidural abnormality described above, there is no spinal canal or neural foraminal stenosis. IMPRESSION: 1. Diffuse osseous metastatic disease with largest lesion at T6. 2. Circumferential epidural extension of tumor at T6-T7 with compression of the spinal cord. Critical Value/emergent results were called by telephone at the time of interpretation on 12/11/2020 at 1:38 am to provider [facility], who verbally acknowledged these results. Electronically Signed   By: Ulyses Jarred M.D.   On: 12/11/2020 01:39   MR Lumbar Spine W Wo Contrast  Result Date: 12/11/2020 CLINICAL DATA:  Shortness of breath and lower extremity swelling EXAM: MRI LUMBAR SPINE WITHOUT AND WITH CONTRAST TECHNIQUE: Multiplanar and multiecho pulse sequences of the lumbar spine were obtained without and with intravenous contrast. CONTRAST:  7.71mL GADAVIST GADOBUTROL 1 MMOL/ML IV SOLN COMPARISON:  None. FINDINGS: Segmentation:  Standard Alignment:  Posterior fusion at L4-S1 Vertebrae: Osseous metastases at all levels. The largest lesion is at T12. No pathologic fracture. Conus medullaris and cauda equina: Conus extends to the L1 level. Conus and cauda equina appear normal. Paraspinal and other soft tissues: Negative Disc levels: Limited assessment of the lower lumbar spinal canal due to magnetic susceptibility effects from spinal hardware. No spinal canal or neural foraminal stenosis of the upper lumbar spine. IMPRESSION: 1. Diffuse lumbar osseous metastatic disease without pathologic fracture. 2. No spinal canal or neural foraminal stenosis of the upper lumbar spine. Electronically Signed   By: Ulyses Jarred M.D.   On: 12/11/2020 01:33     EKG: Independently reviewed. Sinus rhythm  Assessment/Plan Active Problems:   Cord compression [facility])   Metastatic Prostate Cancer with Compression of Spinal Cord Numbness, difficulty ambulating Imaging notable for circumferential epidural extension of tumor at T6-7 with compression of spinal cord (diffuse osseous metastatic disease with largest lesion at T6 -- L spine imaging with diffuse lumbar osseous metastatic disease without pathologic fracture -- MRI brain without acute intracranial on intracranial metastatic disease) Per Dr. Joretta Bachelor from [facility], recommended considering high dose steroids, radiation oncology/medical oncology consults as well as palliative care (invasive surgical care not reasonable consideration given lack of control of systemic disease) Steroids, pain control Radiation oncology c/s Consulted Dr. Irene Limbo from oncology who will see him 5/23  Hypertension Hold cozar and amlodipine  HLD Hold statin   Goals of care Expressed desire for full code whe I was talking to him, will request palliative care consult to help clarify as this would not be c/w hospice philosophy.  Appreciate palliative care and hospice assistance.   DVT prophylaxis: lovenox  Code Status:   full  Family Communication:  None at bedside  Disposition Plan:   Patient is from:  home  Anticipated DC to:  home  Anticipated DC date:  unknown  Anticipated DC barriers: Pending improvement in back pain and improved mobility  Consults called:  Oncology, neurosurgery, radiation oncology, palliative care Admission status:  Inpatient   Severity of Illness: The appropriate patient status for this patient is INPATIENT. Inpatient status is judged to be reasonable and necessary in order to provide the required intensity of service to ensure the patient's safety. The patient's presenting symptoms, physical exam findings, and initial radiographic and laboratory data in the context of their chronic comorbidities  is felt to place them at high risk for further clinical deterioration. Furthermore, it is not anticipated that the patient will be medically stable for discharge from the hospital within 2 midnights of admission. The following factors support the patient status of inpatient.   " The patient's presenting symptoms include difficulty walking, numbness. " The worrisome physical exam findings include LE weakness. " The initial radiographic and laboratory data are worrisome because of cord compression. " The chronic co-morbidities include HTN, HLD, metastatic cancer.   * I certify that at the point of admission it is my clinical judgment that the patient will require inpatient hospital care spanning beyond 2 midnights from the point of admission due to high intensity of service, high risk for further deterioration and high frequency of surveillance required.Fayrene Helper MD [facility]  How to contact the [facility] Attending or Consulting provider [place] or covering provider during after hours [place], for this patient?   1. Check the care team in [facility] and look for Hopelynn Gartland) attending/consulting [facility] provider listed and b) the [facility] team listed 2. Log into [email or web address] and use [facility]'s universal password to access. If you do not have the password, please contact the hospital operator. 3. Locate the [facility] provider you are looking for under [facility] and page to Bernd Crom number that you can be directly reached. 4. If you still have difficulty reaching the provider, please page the [facility] (Director on Call) for the Hospitalists listed on amion for assistance.  12/11/2020, 8:28 AM

## 2020-12-11 NOTE — ED Notes (Signed)
Ruby at [facility] paged radiation oncology Dr. Gretta Cool

## 2020-12-12 ENCOUNTER — Ambulatory Visit: Payer: Medicare HMO | Admitting: Radiation Oncology

## 2020-12-12 ENCOUNTER — Encounter: Payer: Self-pay | Admitting: Oncology

## 2020-12-12 ENCOUNTER — Ambulatory Visit
Admit: 2020-12-12 | Discharge: 2020-12-12 | Disposition: A | Discharge status: Home / Self Care | Payer: Medicare HMO | Attending: Radiation Oncology | Admitting: Radiation Oncology

## 2020-12-12 ENCOUNTER — Encounter (HOSPITAL_COMMUNITY): Payer: Self-pay | Admitting: Family Medicine

## 2020-12-12 DIAGNOSIS — C61 Malignant neoplasm of prostate: Secondary | ICD-10-CM | POA: Diagnosis not present

## 2020-12-12 DIAGNOSIS — C7951 Secondary malignant neoplasm of bone: Principal | ICD-10-CM

## 2020-12-12 DIAGNOSIS — Z7189 Other specified counseling: Secondary | ICD-10-CM

## 2020-12-12 DIAGNOSIS — Z515 Encounter for palliative care: Secondary | ICD-10-CM

## 2020-12-12 DIAGNOSIS — G9520 Unspecified cord compression: Secondary | ICD-10-CM | POA: Diagnosis not present

## 2020-12-12 LAB — CBC WITH DIFFERENTIAL/PLATELET
Abs Immature Granulocytes: 0.03 10*3/uL (ref 0.00–0.07)
Basophils Absolute: 0 10*3/uL (ref 0.0–0.1)
Basophils Relative: 0 %
Eosinophils Absolute: 0 10*3/uL (ref 0.0–0.5)
Eosinophils Relative: 0 %
HCT: 37.3 % — ABNORMAL LOW (ref 39.0–52.0)
Hemoglobin: 12 g/dL — ABNORMAL LOW (ref 13.0–17.0)
Immature Granulocytes: 1 %
Lymphocytes Relative: 7 %
Lymphs Abs: 0.5 10*3/uL — ABNORMAL LOW (ref 0.7–4.0)
MCH: 33.8 pg (ref 26.0–34.0)
MCHC: 32.2 g/dL (ref 30.0–36.0)
MCV: 105.1 fL — ABNORMAL HIGH (ref 80.0–100.0)
Monocytes Absolute: 0.4 10*3/uL (ref 0.1–1.0)
Monocytes Relative: 6 %
Neutro Abs: 5.5 10*3/uL (ref 1.7–7.7)
Neutrophils Relative %: 86 %
Platelets: 168 10*3/uL (ref 150–400)
RBC: 3.55 MIL/uL — ABNORMAL LOW (ref 4.22–5.81)
RDW: 13.1 % (ref 11.5–15.5)
WBC: 6.4 10*3/uL (ref 4.0–10.5)
nRBC: 0 % (ref 0.0–0.2)

## 2020-12-12 LAB — COMPREHENSIVE METABOLIC PANEL
ALT: 13 U/L (ref 0–44)
AST: 19 U/L (ref 15–41)
Albumin: 3.9 g/dL (ref 3.5–5.0)
Alkaline Phosphatase: 141 U/L — ABNORMAL HIGH (ref 38–126)
Anion gap: 8 (ref 5–15)
BUN: 28 mg/dL — ABNORMAL HIGH (ref 8–23)
CO2: 27 mmol/L (ref 22–32)
Calcium: 9.5 mg/dL (ref 8.9–10.3)
Chloride: 104 mmol/L (ref 98–111)
Creatinine, Ser: 1.33 mg/dL — ABNORMAL HIGH (ref 0.61–1.24)
GFR, Estimated: 58 mL/min — ABNORMAL LOW (ref >60)
Glucose, Bld: 145 mg/dL — ABNORMAL HIGH (ref 70–99)
Potassium: 4.8 mmol/L (ref 3.5–5.1)
Sodium: 139 mmol/L (ref 135–145)
Total Bilirubin: 0.4 mg/dL (ref 0.3–1.2)
Total Protein: 6.7 g/dL (ref 6.5–8.1)

## 2020-12-12 LAB — HEMOGLOBIN A1C
Hgb A1c MFr Bld: 5.7 % — ABNORMAL HIGH (ref 4.8–5.6)
Mean Plasma Glucose: 116.89 mg/dL

## 2020-12-12 LAB — GLUCOSE, CAPILLARY
Glucose-Capillary: 136 mg/dL — ABNORMAL HIGH (ref 70–99)
Glucose-Capillary: 190 mg/dL — ABNORMAL HIGH (ref 70–99)
Glucose-Capillary: 113 mg/dL — ABNORMAL HIGH (ref 70–99)

## 2020-12-12 MED ORDER — INSULIN ASPART 100 UNIT/ML IJ SOLN: 0.0000 [IU] | Freq: Every day | INTRAMUSCULAR | Status: DC

## 2020-12-12 MED ORDER — LACTATED RINGERS IV SOLN: INTRAVENOUS | Status: AC

## 2020-12-12 MED ORDER — INSULIN ASPART 100 UNIT/ML IJ SOLN: 0.0000 [IU] | Freq: Three times a day (TID) | INTRAMUSCULAR | Status: DC

## 2020-12-12 NOTE — Consult Note (Signed)
Radiation Oncology         [phone] ________________________________  Name: Terry Mitchell MRN: [id number]  Date: 12/11/2020  DOB: 1951-05-11  WC:BJSEG, Terry Perch, MD  No ref. provider found     REFERRING PHYSICIAN: No ref. provider found   DIAGNOSIS: Stage IV prostate cancer  HISTORY OF PRESENT ILLNESS::Terry Mitchell is a 70 y.o. male who is seen for an initial consultation visit regarding the patient's diagnosis of metastatic cancer with involvement of the spine.  The patient has a known diagnosis of metastatic prostate cancer.  He previously received radiation treatment to the pelvis targeting the prostate at the time of his original diagnosis of this was completed a couple of years ago.  The patient has been noted to have progression subsequently and has undergone chemotherapy.  More recently he has proceeded with palliative medicine.  However, the patient began having some significant weakness some accompanied by numbness in the lower extremities.  MRI scan has shown multifocal bony disease including a significant lesion at the T6 level with encroachment at the cord.  I have therefore been asked to see the patient for urgent radiation treatment to this lesion.    PREVIOUS RADIATION THERAPY: Yes    PAST MEDICAL HISTORY:  has a past medical history of Depression, History of recent fall (01/2018), Hypertension, and Prostate cancer ([name]) (12/2017).     PAST SURGICAL HISTORY: Past Surgical History:  Procedure Laterality Date  . BACK SURGERY  2001    2 rods and 6 screws in back  . COLONOSCOPY WITH PROPOFOL N/A 09/24/2019   Procedure: COLONOSCOPY WITH PROPOFOL;  Surgeon: Jonathon Bellows, MD;  Location: [facility] ENDOSCOPY;  Service: Gastroenterology;  Laterality: N/A;  . Left shoulder surgery Left 1998   removed a piece of bone around collar bone  . PROSTATE BIOPSY N/A 02/28/2018   Procedure: PROSTATE BIOPSY;  Surgeon: Abbie Sons, MD;  Location: [facility] ORS;  Service: Urology;   Laterality: N/A;  . TRANSRECTAL ULTRASOUND N/A 02/28/2018   Procedure: TRANSRECTAL ULTRASOUND;  Surgeon: Abbie Sons, MD;  Location: [facility] ORS;  Service: Urology;  Laterality: N/A;     FAMILY HISTORY: family history includes Diabetes in his maternal aunt; Kidney failure in his mother; Stroke in his mother.   SOCIAL HISTORY:  reports that he quit smoking about 16 months ago. His smoking use included cigars. He smoked 2.00 packs per day. He has never used smokeless tobacco. He reports current alcohol use of about 9.0 standard drinks of alcohol per week. He reports current drug use. Drugs: Cocaine and "Crack" cocaine.   ALLERGIES: Penicillins and Lactose intolerance (gi)   MEDICATIONS:  Current Facility-Administered Medications  Medication Dose Route Frequency Provider Last Rate Last Admin  . acetaminophen (TYLENOL) tablet 650 mg  650 mg Oral Q6H PRN Elodia Florence., MD   650 mg at 12/12/20 0548   Or  . acetaminophen (TYLENOL) suppository 650 mg  650 mg Rectal Q6H PRN Elodia Florence., MD      . dexamethasone (DECADRON) injection 4 mg  4 mg Intravenous Q6H Elodia Florence., MD   4 mg at 12/12/20 0549  . enoxaparin (LOVENOX) injection 40 mg  40 mg Subcutaneous Daily Elodia Florence., MD   40 mg at 12/11/20 3151  . morphine (MS CONTIN) 12 hr tablet 15 mg  15 mg Oral Q12H Elodia Florence., MD   15 mg at 12/11/20 2154  . ondansetron (ZOFRAN) tablet 4 mg  4 mg Oral Q6H PRN Elodia Florence., MD       Or  . ondansetron [facility]) injection 4 mg  4 mg Intravenous Q6H PRN Elodia Florence., MD      . oxyCODONE (Oxy IR/ROXICODONE) immediate release tablet 5 mg  5 mg Oral Q4H PRN Elodia Florence., MD   5 mg at 12/12/20 0548   Or  . oxyCODONE (Oxy IR/ROXICODONE) immediate release tablet 10 mg  10 mg Oral Q4H PRN Elodia Florence., MD      . polyethylene glycol [facility] / GLYCOLAX) packet 17 g  17 g Oral BID Elodia Florence., MD   17 g at  12/11/20 0908  . vitamin B-12 (CYANOCOBALAMIN) tablet 1,000 mcg  1,000 mcg Oral Daily Elodia Florence., MD   1,000 mcg at 12/11/20 1926   Facility-Administered Medications Ordered in Other Encounters  Medication Dose Route Frequency Provider Last Rate Last Admin  . sodium chloride flush (NS) 0.9 % injection 10 mL  10 mL Intravenous Once [name], MD         REVIEW OF SYSTEMS:  A 15 point review of systems is documented in the electronic medical record. This was obtained by the nursing staff. However, I reviewed this with the patient to discuss relevant findings and make appropriate changes.  Pertinent items are noted in HPI.    PHYSICAL EXAM:  oral temperature is 98 F (36.7 C). His blood pressure is 164/90 (abnormal) and his pulse is 58 (abnormal). His respiration is 17 and oxygen saturation is 100%.   ECOG = 2  0 - Asymptomatic (Fully active, able to carry on all predisease activities without restriction)  1 - Symptomatic but completely ambulatory (Restricted in physically strenuous activity but ambulatory and able to carry out work of a light or sedentary nature. For example, light housework, office work)  2 - Symptomatic, <50% in bed during the day (Ambulatory and capable of all self care but unable to carry out any work activities. Up and about more than 50% of waking hours)  3 - Symptomatic, >50% in bed, but not bedbound (Capable of only limited self-care, confined to bed or chair 50% or more of waking hours)  4 - Bedbound (Completely disabled. Cannot carry on any self-care. Totally confined to bed or chair)  5 - Death   Eustace Pen MM, Creech RH, Tormey DC, et al. [phone]). "Toxicity and response criteria of the [facility]". [place] Oncol. 5 (6): 649-55  alert,no distress;  4/5 strength bilaterallower extremities on plantar flexion/ dorsiflexion   LABORATORY DATA:  Lab Results  Component Value Date   WBC 6.4 12/12/2020   HGB 12.0 (L) 12/12/2020    HCT 37.3 (L) 12/12/2020   MCV 105.1 (H) 12/12/2020   PLT 168 12/12/2020   Lab Results  Component Value Date   NA 139 12/12/2020   K 4.8 12/12/2020   CL 104 12/12/2020   CO2 27 12/12/2020   Lab Results  Component Value Date   ALT 13 12/12/2020   AST 19 12/12/2020   ALKPHOS 141 (H) 12/12/2020   BILITOT 0.4 12/12/2020      RADIOGRAPHY: CT ABDOMEN PELVIS WO CONTRAST  Result Date: 12/10/2020 CLINICAL DATA:  Abdominal distension. EXAM: CT ABDOMEN AND PELVIS WITHOUT CONTRAST TECHNIQUE: Multidetector CT imaging of the abdomen and pelvis was performed following the standard protocol without IV contrast. COMPARISON:  April 28, 2019. FINDINGS: Lower chest: No acute abnormality. Hepatobiliary:  No focal liver abnormality is seen. No gallstones, gallbladder wall thickening, or biliary dilatation. Pancreas: Unremarkable. No pancreatic ductal dilatation or surrounding inflammatory changes. Spleen: Normal in size without focal abnormality. Adrenals/Urinary Tract: Adrenal glands are unremarkable. Kidneys are normal, without renal calculi, focal lesion, or hydronephrosis. Bladder is unremarkable. Stomach/Bowel: The stomach appears normal. There is no evidence of bowel obstruction or inflammation. Vascular/Lymphatic: Aortic atherosclerosis. No enlarged abdominal or pelvic lymph nodes. Reproductive: Stable prostatic calcifications. Other: No abdominal wall hernia or abnormality. No abdominopelvic ascites. Musculoskeletal: Status post surgical posterior fusion of L4-5 and L5-S1 with bilateral intrapedicular screw placement. Increased abnormal sclerotic density is noted in T12 vertebral body concerning for possible metastatic disease. IMPRESSION: Increased abnormal sclerotic density is noted in T12 vertebral body concerning for possible metastatic disease. No other significant abnormality seen in the abdomen or pelvis. Aortic Atherosclerosis (ICD10-I70.0). Electronically Signed   By: Marijo Conception M.D.   On:  12/10/2020 18:36   CT Head Wo Contrast  Result Date: 12/10/2020 CLINICAL DATA:  Dizziness. EXAM: CT HEAD WITHOUT CONTRAST TECHNIQUE: Contiguous axial images were obtained from the base of the skull through the vertex without intravenous contrast. COMPARISON:  May 30, 2017. FINDINGS: Brain: Old lacunar infarction is noted in right basal ganglia. No mass effect or midline shift is noted. Ventricular size is within normal limits. There is no evidence of mass lesion, hemorrhage or acute infarction. Vascular: No hyperdense vessel or unexpected calcification. Skull: Normal. Negative for fracture or focal lesion. Sinuses/Orbits: Right sphenoid sinusitis is noted. Other: None. IMPRESSION: No acute intracranial abnormality seen.  Right sphenoid sinusitis. Electronically Signed   By: Marijo Conception M.D.   On: 12/10/2020 18:29   MR Brain W and Wo Contrast  Result Date: 12/11/2020 CLINICAL DATA:  Lower extremity weakness EXAM: MRI HEAD WITHOUT AND WITH CONTRAST TECHNIQUE: Multiplanar, multiecho pulse sequences of the brain and surrounding structures were obtained without and with intravenous contrast. CONTRAST:  7.67mL GADAVIST GADOBUTROL 1 MMOL/ML IV SOLN COMPARISON:  None. FINDINGS: Brain: No acute infarct, mass effect or extra-axial collection. No acute or chronic hemorrhage. There is multifocal hyperintense T2-weighted signal within the white matter. Generalized volume loss without a clear lobar predilection. The midline structures are normal. There is no abnormal contrast enhancement. Old right basal ganglia small vessel infarct. Vascular: Major flow voids are preserved. Skull and upper cervical spine: Normal calvarium and skull base. Visualized upper cervical spine and soft tissues are normal. Sinuses/Orbits:No paranasal sinus fluid levels or advanced mucosal thickening. No mastoid or middle ear effusion. Normal orbits. IMPRESSION: 1. No acute intracranial or intracranial metastatic disease. 2. Multifocal  hyperintense T2-weighted signal within the white matter, most consistent with chronic microvascular ischemia. 3. Generalized volume loss without a clear lobar predilection. Electronically Signed   By: Ulyses Jarred M.D.   On: 12/11/2020 01:38   MR THORACIC SPINE W WO CONTRAST  Result Date: 12/11/2020 CLINICAL DATA:  Difficulty walking. EXAM: MRI THORACIC WITHOUT AND WITH CONTRAST TECHNIQUE: Multiplanar and multiecho pulse sequences of the thoracic spine were obtained without and with intravenous contrast. CONTRAST:  7.50mL GADAVIST GADOBUTROL 1 MMOL/ML IV SOLN COMPARISON:  None. FINDINGS: Alignment:  Normal Vertebrae: There are numerous metastatic lesions throughout the visualized spine. The largest lesion is at T6 where there is circumferential epidural extension of tumor that effaces the thecal sac and compresses the spinal cord. Cord:  No parenchymal abnormality. Paraspinal and other soft tissues: Negative Disc levels: Aside from the epidural abnormality described above, there is no spinal  canal or neural foraminal stenosis. IMPRESSION: 1. Diffuse osseous metastatic disease with largest lesion at T6. 2. Circumferential epidural extension of tumor at T6-T7 with compression of the spinal cord. Critical Value/emergent results were called by telephone at the time of interpretation on 12/11/2020 at 1:38 am to provider [facility], who verbally acknowledged these results. Electronically Signed   By: Ulyses Jarred M.D.   On: 12/11/2020 01:39   MR Lumbar Spine W Wo Contrast  Result Date: 12/11/2020 CLINICAL DATA:  Shortness of breath and lower extremity swelling EXAM: MRI LUMBAR SPINE WITHOUT AND WITH CONTRAST TECHNIQUE: Multiplanar and multiecho pulse sequences of the lumbar spine were obtained without and with intravenous contrast. CONTRAST:  7.44mL GADAVIST GADOBUTROL 1 MMOL/ML IV SOLN COMPARISON:  None. FINDINGS: Segmentation:  Standard Alignment:  Posterior fusion at L4-S1 Vertebrae: Osseous metastases at all  levels. The largest lesion is at T12. No pathologic fracture. Conus medullaris and cauda equina: Conus extends to the L1 level. Conus and cauda equina appear normal. Paraspinal and other soft tissues: Negative Disc levels: Limited assessment of the lower lumbar spinal canal due to magnetic susceptibility effects from spinal hardware. No spinal canal or neural foraminal stenosis of the upper lumbar spine. IMPRESSION: 1. Diffuse lumbar osseous metastatic disease without pathologic fracture. 2. No spinal canal or neural foraminal stenosis of the upper lumbar spine. Electronically Signed   By: Ulyses Jarred M.D.   On: 12/11/2020 01:33       IMPRESSION/ PLAN:  The patient has stage IV prostate cancer with cord compression at the T6 level.  He is appropriate to proceed with urgent radiation treatment.  I discussed proceeding with this today and he is in agreement.  He has had no other radiation treatment other than that to the pelvis previously.  I discussed beginning treatment today and we will proceed with a planned course of 30 Gray in 10 fractions.       ________________________________   Jodelle Gross, MD, PhD   **Disclaimer: This note was dictated with voice recognition software. Similar sounding words can inadvertently be transcribed and this note may contain transcription errors which may not have been corrected upon publication of note.**

## 2020-12-12 NOTE — Progress Notes (Signed)
[facility] ([facility])     Effective today this patient has elected to revoke his hospice benefit in order to pursue more aggressive treatment options.  He has been advised he can re-access his hospice benefit should he so choose.  Thank you for the opportunity to participate in this patient's care.     Domenic Moras, BSN, RN [facility] Liaison [phone] (24h on call)

## 2020-12-12 NOTE — Progress Notes (Addendum)
PROGRESS NOTE    [name]  [id number] DOB: 06/23/1951 DOA: 12/11/2020 PCP: [name], MD   No chief complaint on file.  Brief Narrative: Terry Mitchell is Terry Mitchell 70 y.o. male with medical history significant of metastatic prostatic cancer and hypertension as well as depression presenting with increasing difficulty with ambulation.  Terry Mitchell has Terry Mitchell history of meastatic prostate cancer, previously following with Dr. Tasia Catchings.  He had docetaxel x6 and was unable to tolerate xtandi.  He declined additional follow up with Dr. Tasia Catchings after his appointment in 05/06/2020.  He's been followed by hospice.  He notes he's had pain the past 3-4 months.  He's been managed for his pain by hospice.  He notes that morphine was recently added to his pain regiment last Friday.  Things went well for Terry Mitchell few days, then he had difficulty getting up to the bathroom on Wednesday.  His legs felt weak.  Thursday and Friday he felt bloated.  Initially thought it was new pain medicine and stopped morphine (stopped on Thursday), but his symptoms persisted.  He noted progressive worsening with numbness of his abdomen, legs, stomach.  He eventually presented to the ED due to the progression of these symptoms.  He notes 2 weeks ago he was riding Terry Mitchell motorcycle.  Last week he was taking trash to dumpster, can't do that now.    ED Course: imaging concerning for cord compression, discussed with neurosurgery, radiation oncology.  Transfer to [facility], admit to hospitalist.  Assessment & Plan:   Active Problems:   Prostate cancer [facility])   Prostate cancer metastatic to bone [facility])   Cord compression [facility])  Metastatic Prostate Cancer with Compression of Spinal Cord Numbness, difficulty ambulating Imaging notable for circumferential epidural extension of tumor at T6-7 with compression of spinal cord (diffuse osseous metastatic disease with largest lesion at T6 -- L spine imaging with diffuse lumbar osseous metastatic disease without  pathologic fracture -- MRI brain without acute intracranial on intracranial metastatic disease) Per Dr. Joretta Bachelor from [facility], recommended considering high dose steroids, radiation oncology/medical oncology consults as well as palliative care (invasive surgical care not reasonable consideration given lack of control of systemic disease) Subjective mild improvement today, follow with radiation/steroids Steroids, pain control Radiation oncology c/s -> to start radiation therapy today Consulted Dr. Irene Limbo from oncology who will see him 5/23  Hyperglycemia  2/2 steroids, follow with SSI, follow A1c  Hypertension Hold cozar and amlodipine  HLD Hold statin   Goals of care Expressed desire for full code whe I was talking to him, will request palliative care consult to help clarify as this would not be c/w hospice philosophy.  Appreciate palliative care and hospice assistance.    DVT prophylaxis: lovenox Code Status: full  Family Communication: none at bedside Disposition:   Status is: Inpatient  Remains inpatient appropriate because:Inpatient level of care appropriate due to severity of illness   Dispo: The patient is from: Home              Anticipated d/c is to: Home              Patient currently is not medically stable to d/c.   Difficult to place patient No       Consultants:   Radiation oncology  Oncology  Palliative Care  Procedures: none  Antimicrobials:  Anti-infectives (From admission, onward)   None         Subjective: No new complaints   Objective: Vitals:   12/11/20  1723 12/11/20 2153 12/12/20 0217 12/12/20 0501  BP: (!) 144/97 (!) 138/91 (!) 154/95 (!) 164/90  Pulse: 64 (!) 57 (!) 48 (!) 58  Resp: 18 15 18 17   Temp: 97.9 F (36.6 C) 98.1 F (36.7 C) 97.6 F (36.4 C) 98 F (36.7 C)  TempSrc: Oral Oral Oral Oral  SpO2: 97% 99% 100% 100%    Intake/Output Summary (Last 24 hours) at 12/12/2020 0913 Last data filed at 12/12/2020 0804 Gross  per 24 hour  Intake 360 ml  Output 400 ml  Net -40 ml   There were no vitals filed for this visit.  Examination:  General exam: Appears calm and comfortable  Respiratory system: Clear to auscultation. Respiratory effort normal. Cardiovascular system: S1 & S2 heard, RRR.  Gastrointestinal system: Abdomen is nondistended, soft and nontender. Central nervous system: Alert and oriented. Numbness below T6 dermatome, he notes subjectively better (though still present).  4+ strength to bilateral lower extremities. Extremities: no LEE Skin: No rashes, lesions or ulcers Psychiatry: Judgement and insight appear normal. Mood & affect appropriate.     Data Reviewed: I have personally reviewed following labs and imaging studies  CBC: Recent Labs  Lab 12/10/20 1724 12/11/20 0846 12/12/20 0421  WBC 7.3 4.7 6.4  NEUTROABS 5.7  --  5.5  HGB 12.4* 13.2 12.0*  HCT 37.8* 41.2 37.3*  MCV 103.3* 106.2* 105.1*  PLT 170 179 093    Basic Metabolic Panel: Recent Labs  Lab 12/10/20 1724 12/11/20 0748 12/11/20 0846 12/12/20 0421  NA 138 137  --  139  K 4.0 4.8  --  4.8  CL 102 102  --  104  CO2 25 27  --  27  GLUCOSE 108* 125*  --  145*  BUN 20 21  --  28*  CREATININE 1.35* 1.25* 1.18 1.33*  CALCIUM 9.8 9.8  --  9.5  MG  --  2.2  --   --   PHOS  --  3.8  --   --     GFR: Estimated Creatinine Clearance: 58.3 mL/min (Terry Mitchell) (by C-G formula based on SCr of 1.33 mg/dL (H)).  Liver Function Tests: Recent Labs  Lab 12/10/20 1724 12/11/20 0748 12/12/20 0421  AST 31 22 19   ALT 16 14 13   ALKPHOS 151* 153* 141*  BILITOT 0.8 0.7 0.4  PROT 7.4 7.0 6.7  ALBUMIN 4.2 4.1 3.9    CBG: No results for input(s): GLUCAP in the last 168 hours.   Recent Results (from the past 240 hour(s))  Resp Panel by RT-PCR (Flu Terry Mitchell&B, Covid) Nasopharyngeal Swab     Status: None   Collection Time: 12/10/20  6:33 PM   Specimen: Nasopharyngeal Swab; Nasopharyngeal(NP) swabs in vial transport medium  Result  Value Ref Range Status   SARS Coronavirus 2 by RT PCR NEGATIVE NEGATIVE Final    Comment: (NOTE) SARS-CoV-2 target nucleic acids are NOT DETECTED.  The SARS-CoV-2 RNA is generally detectable in upper respiratory specimens during the acute phase of infection. The lowest concentration of SARS-CoV-2 viral copies this assay can detect is 138 copies/mL. Ahyana Skillin negative result does not preclude SARS-Cov-2 infection and should not be used as the sole basis for treatment or other patient management decisions. Shailen Thielen negative result may occur with  improper specimen collection/handling, submission of specimen other than nasopharyngeal swab, presence of viral mutation(s) within the areas targeted by this assay, and inadequate number of viral copies(<138 copies/mL). Ezechiel Stooksbury negative result must be combined with clinical observations, patient history, and  epidemiological information. The expected result is Negative.  Fact Sheet for Patients:  EntrepreneurPulse.com.au  Fact Sheet for Healthcare Providers:  IncredibleEmployment.be  This test is no t yet approved or cleared by the [place] FDA and  has been authorized for detection and/or diagnosis of SARS-CoV-2 by FDA under an Emergency Use Authorization (EUA). This EUA will remain  in effect (meaning this test can be used) for the duration of the COVID-19 declaration under Section 564(b)(1) of the Act, 21 U.S.C.section 360bbb-3(b)(1), unless the authorization is terminated  or revoked sooner.       Influenza Cyerra Yim by PCR NEGATIVE NEGATIVE Final   Influenza B by PCR NEGATIVE NEGATIVE Final    Comment: (NOTE) The Xpert Xpress SARS-CoV-2/FLU/RSV plus assay is intended as an aid in the diagnosis of influenza from Nasopharyngeal swab specimens and should not be used as Aldonia Keeven sole basis for treatment. Nasal washings and aspirates are unacceptable for Xpert Xpress SARS-CoV-2/FLU/RSV testing.  Fact Sheet for  Patients: EntrepreneurPulse.com.au  Fact Sheet for Healthcare Providers: IncredibleEmployment.be  This test is not yet approved or cleared by the [place] FDA and has been authorized for detection and/or diagnosis of SARS-CoV-2 by FDA under an Emergency Use Authorization (EUA). This EUA will remain in effect (meaning this test can be used) for the duration of the COVID-19 declaration under Section 564(b)(1) of the Act, 21 U.S.C. section 360bbb-3(b)(1), unless the authorization is terminated or revoked.  Performed at [facility], [address] [place], [place] [address]          Radiology Studies: CT ABDOMEN PELVIS WO CONTRAST  Result Date: 12/10/2020 CLINICAL DATA:  Abdominal distension. EXAM: CT ABDOMEN AND PELVIS WITHOUT CONTRAST TECHNIQUE: Multidetector CT imaging of the abdomen and pelvis was performed following the standard protocol without IV contrast. COMPARISON:  April 28, 2019. FINDINGS: Lower chest: No acute abnormality. Hepatobiliary: No focal liver abnormality is seen. No gallstones, gallbladder wall thickening, or biliary dilatation. Pancreas: Unremarkable. No pancreatic ductal dilatation or surrounding inflammatory changes. Spleen: Normal in size without focal abnormality. Adrenals/Urinary Tract: Adrenal glands are unremarkable. Kidneys are normal, without renal calculi, focal lesion, or hydronephrosis. Bladder is unremarkable. Stomach/Bowel: The stomach appears normal. There is no evidence of bowel obstruction or inflammation. Vascular/Lymphatic: Aortic atherosclerosis. No enlarged abdominal or pelvic lymph nodes. Reproductive: Stable prostatic calcifications. Other: No abdominal wall hernia or abnormality. No abdominopelvic ascites. Musculoskeletal: Status post surgical posterior fusion of L4-5 and L5-S1 with bilateral intrapedicular screw placement. Increased abnormal sclerotic density is noted in T12 vertebral body  concerning for possible metastatic disease. IMPRESSION: Increased abnormal sclerotic density is noted in T12 vertebral body concerning for possible metastatic disease. No other significant abnormality seen in the abdomen or pelvis. Aortic Atherosclerosis (ICD10-I70.0). Electronically Signed   By: Marijo Conception M.D.   On: 12/10/2020 18:36   CT Head Wo Contrast  Result Date: 12/10/2020 CLINICAL DATA:  Dizziness. EXAM: CT HEAD WITHOUT CONTRAST TECHNIQUE: Contiguous axial images were obtained from the base of the skull through the vertex without intravenous contrast. COMPARISON:  May 30, 2017. FINDINGS: Brain: Old lacunar infarction is noted in right basal ganglia. No mass effect or midline shift is noted. Ventricular size is within normal limits. There is no evidence of mass lesion, hemorrhage or acute infarction. Vascular: No hyperdense vessel or unexpected calcification. Skull: Normal. Negative for fracture or focal lesion. Sinuses/Orbits: Right sphenoid sinusitis is noted. Other: None. IMPRESSION: No acute intracranial abnormality seen.  Right sphenoid sinusitis. Electronically Signed   By: Jeneen Rinks  Murlean Caller M.D.   On: 12/10/2020 18:29   MR Brain W and Wo Contrast  Result Date: 12/11/2020 CLINICAL DATA:  Lower extremity weakness EXAM: MRI HEAD WITHOUT AND WITH CONTRAST TECHNIQUE: Multiplanar, multiecho pulse sequences of the brain and surrounding structures were obtained without and with intravenous contrast. CONTRAST:  7.36mL GADAVIST GADOBUTROL 1 MMOL/ML IV SOLN COMPARISON:  None. FINDINGS: Brain: No acute infarct, mass effect or extra-axial collection. No acute or chronic hemorrhage. There is multifocal hyperintense T2-weighted signal within the white matter. Generalized volume loss without Rexine Gowens clear lobar predilection. The midline structures are normal. There is no abnormal contrast enhancement. Old right basal ganglia small vessel infarct. Vascular: Major flow voids are preserved. Skull and upper  cervical spine: Normal calvarium and skull base. Visualized upper cervical spine and soft tissues are normal. Sinuses/Orbits:No paranasal sinus fluid levels or advanced mucosal thickening. No mastoid or middle ear effusion. Normal orbits. IMPRESSION: 1. No acute intracranial or intracranial metastatic disease. 2. Multifocal hyperintense T2-weighted signal within the white matter, most consistent with chronic microvascular ischemia. 3. Generalized volume loss without Yacine Droz clear lobar predilection. Electronically Signed   By: Ulyses Jarred M.D.   On: 12/11/2020 01:38   MR THORACIC SPINE W WO CONTRAST  Result Date: 12/11/2020 CLINICAL DATA:  Difficulty walking. EXAM: MRI THORACIC WITHOUT AND WITH CONTRAST TECHNIQUE: Multiplanar and multiecho pulse sequences of the thoracic spine were obtained without and with intravenous contrast. CONTRAST:  7.48mL GADAVIST GADOBUTROL 1 MMOL/ML IV SOLN COMPARISON:  None. FINDINGS: Alignment:  Normal Vertebrae: There are numerous metastatic lesions throughout the visualized spine. The largest lesion is at T6 where there is circumferential epidural extension of tumor that effaces the thecal sac and compresses the spinal cord. Cord:  No parenchymal abnormality. Paraspinal and other soft tissues: Negative Disc levels: Aside from the epidural abnormality described above, there is no spinal canal or neural foraminal stenosis. IMPRESSION: 1. Diffuse osseous metastatic disease with largest lesion at T6. 2. Circumferential epidural extension of tumor at T6-T7 with compression of the spinal cord. Critical Value/emergent results were called by telephone at the time of interpretation on 12/11/2020 at 1:38 am to provider [facility], who verbally acknowledged these results. Electronically Signed   By: Ulyses Jarred M.D.   On: 12/11/2020 01:39   MR Lumbar Spine W Wo Contrast  Result Date: 12/11/2020 CLINICAL DATA:  Shortness of breath and lower extremity swelling EXAM: MRI LUMBAR SPINE WITHOUT  AND WITH CONTRAST TECHNIQUE: Multiplanar and multiecho pulse sequences of the lumbar spine were obtained without and with intravenous contrast. CONTRAST:  7.25mL GADAVIST GADOBUTROL 1 MMOL/ML IV SOLN COMPARISON:  None. FINDINGS: Segmentation:  Standard Alignment:  Posterior fusion at L4-S1 Vertebrae: Osseous metastases at all levels. The largest lesion is at T12. No pathologic fracture. Conus medullaris and cauda equina: Conus extends to the L1 level. Conus and cauda equina appear normal. Paraspinal and other soft tissues: Negative Disc levels: Limited assessment of the lower lumbar spinal canal due to magnetic susceptibility effects from spinal hardware. No spinal canal or neural foraminal stenosis of the upper lumbar spine. IMPRESSION: 1. Diffuse lumbar osseous metastatic disease without pathologic fracture. 2. No spinal canal or neural foraminal stenosis of the upper lumbar spine. Electronically Signed   By: Ulyses Jarred M.D.   On: 12/11/2020 01:33        Scheduled Meds: . dexamethasone (DECADRON) injection  4 mg Intravenous Q6H  . enoxaparin (LOVENOX) injection  40 mg Subcutaneous Daily  . morphine  15 mg Oral Q12H  .  polyethylene glycol  17 g Oral BID  . vitamin B-12  1,000 mcg Oral Daily   Continuous Infusions: . lactated ringers       LOS: 1 day    Time spent: over 30 min    Fayrene Helper, MD [facility]   To contact the attending provider between 7A-7P or the covering provider during after hours 7P-7A, please log into the web site [email or web address] and access using universal [place] password for that web site. If you do not have the password, please call the hospital operator.  12/12/2020, 9:13 AM

## 2020-12-12 NOTE — Consult Note (Addendum)
[place]  Telephone:([phone] Fax:(336) [phone]   MEDICAL ONCOLOGY - INITIAL CONSULTATION  Referral MD: Dr. Alben Mitchell  Reason for Referral: Metastatic prostate cancer, goals of care discussion  HPI: [name] is a 70 year old male with a past medical history significant for metastatic prostate cancer, hypertension, depression.  He presented to the hospital with difficulty walking and back pain.  He has been off treatment for his metastatic prostate cancer since about October 2021 and is currently being followed by hospice.  The patient reports that he was able to ride his motorcycle about 2 weeks ago was able to provide trash to the dumpster.  But more recently, he has not been able to do these things.  He reported that he has developed worsening lower extremity weakness and abdominal bloating which he thought was related to taking morphine which was a new medication for him.  He stopped taking the morphine and his symptoms did not improve.  In the emergency room, he had an MRI of the thoracic and lumbar spine which showed diffuse osseous metastatic disease with the largest lesion at T6 and epidural extension of tumor at T6-T7 with compression of the spinal cord.  MRI of the brain negative for metastatic disease.  He was seen by Dr. Lisbeth Mitchell of radiation oncology who is planning for a course of radiation to T6.  The patient last saw his medical oncologist, Dr. Tasia Mitchell, on 05/06/2020.  His prostate cancer treatment is as follows:  #01/01/2018 Patient received Firmagon loading dose at urologist office  # patient had a prostate biopsy on 02/28/2018 pathology showed prostate adenocarcinoma with androgen deprivation treatment effect.  Adenocarcinoma is present in all core fragments with involvement of 50% or more of each core.  Perineural invasion is present. As patient has received Firmagon prior to biopsy, Gleason grade cannot be reliably assessed after androgen deprivation  therapy.  #  discussed with patient's urologist Dr. Bernardo Mitchell who did patient's prostate biopsy.  He agrees that given [place] prior to biopsy and staging images are not typical.  He agrees that PET scan is consistent with lymph node involvement.  He does not think patient is a good candidate for radical prostatectomy.  He agrees with me about the plan that definitive radiation is a good option.  # September 2019  external Beam radiation. He was scheduled to have I-125 interstitial implant although that can not been done so patient underwent external beam IMRT prostate boost, finished in January 2020.  # patient was on androgen deprivation therapy with Firmagon injections, until August 2020. Declined ADT due to vasomotor symptoms.   #Blood work from 04/14/2019 showed testosterone level of 284, PSA has been decreased to 24.18.  # 04/28/2019 CT chest abdomen pelvis and bone scan images were independently reviewed by me and discussed with patient. Images are consistent with metastatic prostate cancer.  # 09/24/2019 colonoscopy.  Descending colon polyp, resected.  Pathology showed tubular adenoma.  Negative for high-grade dysplasia or malignancy. # # 09/25/2019 Endoscopy  # 1023/2021- 10/05/2019  6 cycles of Docetaxel treatments # 01/06/20 US renal showed soft tissue lesion within the posterior wall of bladder suspicious for underlying mass.  I called patient at that time and he declined urology referral or additional work up and hang up.   #04/08/2020, patient kept his appointment and was seen by me.  Patient declined additional urology work-up or imaging work-up.  He agreed to try [facility]  According to the last office note, it appears the  patient tried Xtandi for 2 to 3 weeks but did not tolerate it and stopped it.  The patient was seen in his hospital room today.  He just finished working with physical therapy.  He was able to take a few steps with a walker.  Left leg is weaker than the right leg.   Today, he tells me that his back pain is improved.  He reports pain was in his mid back and radiates around to his ribs.  Reports abdominal distention.  He currently denies fevers, chills, night sweats, chest pain, shortness of breath, cough, nausea, vomiting, constipation.  The patient lives in [place], [place].  He lives with his significant other.  The patient tells me today that he is not interested in reenrolling with hospice.  He has expressed that he wishes to live for as long as possible.  When discussed further with him, he expresses that he would like additional treatment for his metastatic prostate cancer.  Medical oncology was asked see the patient make recommendations regarding his metastatic prostate cancer and to discuss goals of care.    Past Medical History:  Diagnosis Date  . Depression    recently went on disability d/t diagnosis  . History of recent fall 01/2018   missed the last step on ladder, causing back discomfort  . Hypertension   . Prostate cancer ([place]) 12/2017   prostate  :  Past Surgical History:  Procedure Laterality Date  . BACK SURGERY  2001    2 rods and 6 screws in back  . COLONOSCOPY WITH PROPOFOL N/A 09/24/2019   Procedure: COLONOSCOPY WITH PROPOFOL;  Surgeon: Terry Bellows, MD;  Location: [facility] ENDOSCOPY;  Service: Gastroenterology;  Laterality: N/A;  . Left shoulder surgery Left 1998   removed a piece of bone around collar bone  . PROSTATE BIOPSY N/A 02/28/2018   Procedure: PROSTATE BIOPSY;  Surgeon: Terry Sons, MD;  Location: [facility] ORS;  Service: Urology;  Laterality: N/A;  . TRANSRECTAL ULTRASOUND N/A 02/28/2018   Procedure: TRANSRECTAL ULTRASOUND;  Surgeon: Terry Sons, MD;  Location: [facility] ORS;  Service: Urology;  Laterality: N/A;  :  Current Facility-Administered Medications  Medication Dose Route Frequency Provider Last Rate Last Admin  . acetaminophen (TYLENOL) tablet 650 mg  650 mg Oral Q6H PRN Terry Mitchell., MD   650 mg  at 12/12/20 0548   Or  . acetaminophen (TYLENOL) suppository 650 mg  650 mg Rectal Q6H PRN Terry Mitchell., MD      . dexamethasone (DECADRON) injection 4 mg  4 mg Intravenous Q6H Terry Mitchell., MD   4 mg at 12/12/20 0549  . enoxaparin (LOVENOX) injection 40 mg  40 mg Subcutaneous Daily Terry Mitchell., MD   40 mg at 12/12/20 0933  . insulin aspart (novoLOG) injection 0-5 Units  0-5 Units Subcutaneous QHS Terry Mitchell., MD      . insulin aspart (novoLOG) injection 0-9 Units  0-9 Units Subcutaneous TID WC Terry Mitchell., MD      . lactated ringers infusion   Intravenous Continuous Terry Mitchell., MD 50 mL/hr at 12/12/20 0932 New Bag at 12/12/20 0932  . morphine (MS CONTIN) 12 hr tablet 15 mg  15 mg Oral Q12H Terry Mitchell., MD   15 mg at 12/12/20 0933  . ondansetron (ZOFRAN) tablet 4 mg  4 mg Oral Q6H PRN Terry Mitchell., MD       Or  .  ondansetron (ZOFRAN) injection 4 mg  4 mg Intravenous Q6H PRN Terry Mitchell., MD      . oxyCODONE (Oxy IR/ROXICODONE) immediate release tablet 5 mg  5 mg Oral Q4H PRN Terry Mitchell., MD   5 mg at 12/12/20 0548   Or  . oxyCODONE (Oxy IR/ROXICODONE) immediate release tablet 10 mg  10 mg Oral Q4H PRN Terry Mitchell., MD      . polyethylene glycol [facility] / GLYCOLAX) packet 17 g  17 g Oral BID Terry Mitchell., MD   17 g at 12/11/20 0908  . vitamin B-12 (CYANOCOBALAMIN) tablet 1,000 mcg  1,000 mcg Oral Daily Terry Mitchell., MD   1,000 mcg at 12/12/20 1610   Facility-Administered Medications Ordered in Other Encounters  Medication Dose Route Frequency Provider Last Rate Last Admin  . sodium chloride flush (NS) 0.9 % injection 10 mL  10 mL Intravenous Once [name], MD         Allergies  Allergen Reactions  . Penicillins Hives and Other (See Comments)    Hives, welts, swelling profusely . Pretty severe reaction Has patient had a PCN reaction causing immediate  rash, facial/tongue/throat swelling, SOB or lightheadedness with hypotension: No Has patient had a PCN reaction causing severe rash involving mucus membranes or skin necrosis: Yes Has patient had a PCN reaction that required hospitalization: Yes Has patient had a PCN reaction occurring within the last 10 years: No If all of the above answers are "NO", then may proceed with Cephalosporin use.   . Lactose Intolerance (Gi) Other (See Comments)    Gi upset  :  Family History  Problem Relation Age of Onset  . Stroke Mother   . Kidney failure Mother   . Diabetes Maternal Aunt   :  Social History   Socioeconomic History  . Marital status: Single    Spouse name: Not on file  . Number of children: 4  . Years of education: Not on file  . Highest education level: Not on file  Occupational History  . Occupation: disability  Tobacco Use  . Smoking status: Former Smoker    Packs/day: 2.00    Types: Cigars    Quit date: 07/24/2019    Years since quitting: 1.3  . Smokeless tobacco: Never Used  . Tobacco comment: 2 cigars  Vaping Use  . Vaping Use: Never used  Substance and Sexual Activity  . Alcohol use: Yes    Alcohol/week: 9.0 standard drinks    Types: 9 Cans of beer per week    Comment: occasional  . Drug use: Yes    Types: Cocaine, "Crack" cocaine    Comment: none within the year  . Sexual activity: Not Currently  Other Topics Concern  . Not on file  Social History Narrative  . Not on file   Social Determinants of Health   Financial Resource Strain: Not on file  Food Insecurity: Not on file  Transportation Needs: Not on file  Physical Activity: Not on file  Stress: Not on file  Social Connections: Not on file  Intimate Partner Violence: Not on file  :  Review of Systems: A comprehensive 14 point review of systems was negative except as noted in the HPI.  Exam: Patient Vitals for the past 24 hrs:  BP Temp Temp src Pulse Resp SpO2  12/12/20 0501 (!) 164/90 98 F  (36.7 C) Oral (!) 58 17 100 %  12/12/20 0217 (!) 154/95 97.6 F (  36.4 C) Oral (!) 48 18 100 %  12/11/20 2153 (!) 138/91 98.1 F (36.7 C) Oral (!) 57 15 99 %  12/11/20 1723 (!) 144/97 97.9 F (36.6 C) Oral 64 18 97 %  12/11/20 1256 (!) 142/95 98.5 F (36.9 C) Oral (!) 57 16 100 %    General:  well-nourished in no acute distress.   Eyes:  no scleral icterus.   ENT:  There were no oropharyngeal lesions.   Lymphatics:  Negative cervical, supraclavicular or axillary adenopathy.   Respiratory: lungs were clear bilaterally without wheezing or crackles.   Cardiovascular:  Regular rate and rhythm, S1/S2, without murmur, rub or gallop.  There was no pedal edema.   GI:  abdomen was soft, flat, nontender, nondistended, without organomegaly.   Musculoskeletal: Strength 4/5 in the bilateral lower extremities, 5/5 in the upper extremities Skin exam was without echymosis, petichae.   Neuro exam was nonfocal. Patient was alert and oriented.  Attention was good.   Language was appropriate.  Mood was normal without depression.  Speech was not pressured.  Thought content was not tangential.     Lab Results  Component Value Date   WBC 6.4 12/12/2020   HGB 12.0 (L) 12/12/2020   HCT 37.3 (L) 12/12/2020   PLT 168 12/12/2020   GLUCOSE 145 (H) 12/12/2020   ALT 13 12/12/2020   AST 19 12/12/2020   NA 139 12/12/2020   K 4.8 12/12/2020   CL 104 12/12/2020   CREATININE 1.33 (H) 12/12/2020   BUN 28 (H) 12/12/2020   CO2 27 12/12/2020    CT ABDOMEN PELVIS WO CONTRAST  Result Date: 12/10/2020 CLINICAL DATA:  Abdominal distension. EXAM: CT ABDOMEN AND PELVIS WITHOUT CONTRAST TECHNIQUE: Multidetector CT imaging of the abdomen and pelvis was performed following the standard protocol without IV contrast. COMPARISON:  April 28, 2019. FINDINGS: Lower chest: No acute abnormality. Hepatobiliary: No focal liver abnormality is seen. No gallstones, gallbladder wall thickening, or biliary dilatation. Pancreas:  Unremarkable. No pancreatic ductal dilatation or surrounding inflammatory changes. Spleen: Normal in size without focal abnormality. Adrenals/Urinary Tract: Adrenal glands are unremarkable. Kidneys are normal, without renal calculi, focal lesion, or hydronephrosis. Bladder is unremarkable. Stomach/Bowel: The stomach appears normal. There is no evidence of bowel obstruction or inflammation. Vascular/Lymphatic: Aortic atherosclerosis. No enlarged abdominal or pelvic lymph nodes. Reproductive: Stable prostatic calcifications. Other: No abdominal wall hernia or abnormality. No abdominopelvic ascites. Musculoskeletal: Status post surgical posterior fusion of L4-5 and L5-S1 with bilateral intrapedicular screw placement. Increased abnormal sclerotic density is noted in T12 vertebral body concerning for possible metastatic disease. IMPRESSION: Increased abnormal sclerotic density is noted in T12 vertebral body concerning for possible metastatic disease. No other significant abnormality seen in the abdomen or pelvis. Aortic Atherosclerosis (ICD10-I70.0). Electronically Signed   By: Marijo Conception M.D.   On: 12/10/2020 18:36   CT Head Wo Contrast  Result Date: 12/10/2020 CLINICAL DATA:  Dizziness. EXAM: CT HEAD WITHOUT CONTRAST TECHNIQUE: Contiguous axial images were obtained from the base of the skull through the vertex without intravenous contrast. COMPARISON:  May 30, 2017. FINDINGS: Brain: Old lacunar infarction is noted in right basal ganglia. No mass effect or midline shift is noted. Ventricular size is within normal limits. There is no evidence of mass lesion, hemorrhage or acute infarction. Vascular: No hyperdense vessel or unexpected calcification. Skull: Normal. Negative for fracture or focal lesion. Sinuses/Orbits: Right sphenoid sinusitis is noted. Other: None. IMPRESSION: No acute intracranial abnormality seen.  Right sphenoid sinusitis. Electronically Signed  By: Marijo Conception M.D.   On: 12/10/2020  18:29   MR Brain W and Wo Contrast  Result Date: 12/11/2020 CLINICAL DATA:  Lower extremity weakness EXAM: MRI HEAD WITHOUT AND WITH CONTRAST TECHNIQUE: Multiplanar, multiecho pulse sequences of the brain and surrounding structures were obtained without and with intravenous contrast. CONTRAST:  7.61mL GADAVIST GADOBUTROL 1 MMOL/ML IV SOLN COMPARISON:  None. FINDINGS: Brain: No acute infarct, mass effect or extra-axial collection. No acute or chronic hemorrhage. There is multifocal hyperintense T2-weighted signal within the white matter. Generalized volume loss without a clear lobar predilection. The midline structures are normal. There is no abnormal contrast enhancement. Old right basal ganglia small vessel infarct. Vascular: Major flow voids are preserved. Skull and upper cervical spine: Normal calvarium and skull base. Visualized upper cervical spine and soft tissues are normal. Sinuses/Orbits:No paranasal sinus fluid levels or advanced mucosal thickening. No mastoid or middle ear effusion. Normal orbits. IMPRESSION: 1. No acute intracranial or intracranial metastatic disease. 2. Multifocal hyperintense T2-weighted signal within the white matter, most consistent with chronic microvascular ischemia. 3. Generalized volume loss without a clear lobar predilection. Electronically Signed   By: Ulyses Jarred M.D.   On: 12/11/2020 01:38   MR THORACIC SPINE W WO CONTRAST  Result Date: 12/11/2020 CLINICAL DATA:  Difficulty walking. EXAM: MRI THORACIC WITHOUT AND WITH CONTRAST TECHNIQUE: Multiplanar and multiecho pulse sequences of the thoracic spine were obtained without and with intravenous contrast. CONTRAST:  7.67mL GADAVIST GADOBUTROL 1 MMOL/ML IV SOLN COMPARISON:  None. FINDINGS: Alignment:  Normal Vertebrae: There are numerous metastatic lesions throughout the visualized spine. The largest lesion is at T6 where there is circumferential epidural extension of tumor that effaces the thecal sac and compresses the  spinal cord. Cord:  No parenchymal abnormality. Paraspinal and other soft tissues: Negative Disc levels: Aside from the epidural abnormality described above, there is no spinal canal or neural foraminal stenosis. IMPRESSION: 1. Diffuse osseous metastatic disease with largest lesion at T6. 2. Circumferential epidural extension of tumor at T6-T7 with compression of the spinal cord. Critical Value/emergent results were called by telephone at the time of interpretation on 12/11/2020 at 1:38 am to provider [facility], who verbally acknowledged these results. Electronically Signed   By: Ulyses Jarred M.D.   On: 12/11/2020 01:39   MR Lumbar Spine W Wo Contrast  Result Date: 12/11/2020 CLINICAL DATA:  Shortness of breath and lower extremity swelling EXAM: MRI LUMBAR SPINE WITHOUT AND WITH CONTRAST TECHNIQUE: Multiplanar and multiecho pulse sequences of the lumbar spine were obtained without and with intravenous contrast. CONTRAST:  7.76mL GADAVIST GADOBUTROL 1 MMOL/ML IV SOLN COMPARISON:  None. FINDINGS: Segmentation:  Standard Alignment:  Posterior fusion at L4-S1 Vertebrae: Osseous metastases at all levels. The largest lesion is at T12. No pathologic fracture. Conus medullaris and cauda equina: Conus extends to the L1 level. Conus and cauda equina appear normal. Paraspinal and other soft tissues: Negative Disc levels: Limited assessment of the lower lumbar spinal canal due to magnetic susceptibility effects from spinal hardware. No spinal canal or neural foraminal stenosis of the upper lumbar spine. IMPRESSION: 1. Diffuse lumbar osseous metastatic disease without pathologic fracture. 2. No spinal canal or neural foraminal stenosis of the upper lumbar spine. Electronically Signed   By: Ulyses Jarred M.D.   On: 12/11/2020 01:33     CT ABDOMEN PELVIS WO CONTRAST  Result Date: 12/10/2020 CLINICAL DATA:  Abdominal distension. EXAM: CT ABDOMEN AND PELVIS WITHOUT CONTRAST TECHNIQUE: Multidetector CT imaging of the  abdomen  and pelvis was performed following the standard protocol without IV contrast. COMPARISON:  April 28, 2019. FINDINGS: Lower chest: No acute abnormality. Hepatobiliary: No focal liver abnormality is seen. No gallstones, gallbladder wall thickening, or biliary dilatation. Pancreas: Unremarkable. No pancreatic ductal dilatation or surrounding inflammatory changes. Spleen: Normal in size without focal abnormality. Adrenals/Urinary Tract: Adrenal glands are unremarkable. Kidneys are normal, without renal calculi, focal lesion, or hydronephrosis. Bladder is unremarkable. Stomach/Bowel: The stomach appears normal. There is no evidence of bowel obstruction or inflammation. Vascular/Lymphatic: Aortic atherosclerosis. No enlarged abdominal or pelvic lymph nodes. Reproductive: Stable prostatic calcifications. Other: No abdominal wall hernia or abnormality. No abdominopelvic ascites. Musculoskeletal: Status post surgical posterior fusion of L4-5 and L5-S1 with bilateral intrapedicular screw placement. Increased abnormal sclerotic density is noted in T12 vertebral body concerning for possible metastatic disease. IMPRESSION: Increased abnormal sclerotic density is noted in T12 vertebral body concerning for possible metastatic disease. No other significant abnormality seen in the abdomen or pelvis. Aortic Atherosclerosis (ICD10-I70.0). Electronically Signed   By: Marijo Conception M.D.   On: 12/10/2020 18:36   CT Head Wo Contrast  Result Date: 12/10/2020 CLINICAL DATA:  Dizziness. EXAM: CT HEAD WITHOUT CONTRAST TECHNIQUE: Contiguous axial images were obtained from the base of the skull through the vertex without intravenous contrast. COMPARISON:  May 30, 2017. FINDINGS: Brain: Old lacunar infarction is noted in right basal ganglia. No mass effect or midline shift is noted. Ventricular size is within normal limits. There is no evidence of mass lesion, hemorrhage or acute infarction. Vascular: No hyperdense vessel or  unexpected calcification. Skull: Normal. Negative for fracture or focal lesion. Sinuses/Orbits: Right sphenoid sinusitis is noted. Other: None. IMPRESSION: No acute intracranial abnormality seen.  Right sphenoid sinusitis. Electronically Signed   By: Marijo Conception M.D.   On: 12/10/2020 18:29   MR Brain W and Wo Contrast  Result Date: 12/11/2020 CLINICAL DATA:  Lower extremity weakness EXAM: MRI HEAD WITHOUT AND WITH CONTRAST TECHNIQUE: Multiplanar, multiecho pulse sequences of the brain and surrounding structures were obtained without and with intravenous contrast. CONTRAST:  7.74mL GADAVIST GADOBUTROL 1 MMOL/ML IV SOLN COMPARISON:  None. FINDINGS: Brain: No acute infarct, mass effect or extra-axial collection. No acute or chronic hemorrhage. There is multifocal hyperintense T2-weighted signal within the white matter. Generalized volume loss without a clear lobar predilection. The midline structures are normal. There is no abnormal contrast enhancement. Old right basal ganglia small vessel infarct. Vascular: Major flow voids are preserved. Skull and upper cervical spine: Normal calvarium and skull base. Visualized upper cervical spine and soft tissues are normal. Sinuses/Orbits:No paranasal sinus fluid levels or advanced mucosal thickening. No mastoid or middle ear effusion. Normal orbits. IMPRESSION: 1. No acute intracranial or intracranial metastatic disease. 2. Multifocal hyperintense T2-weighted signal within the white matter, most consistent with chronic microvascular ischemia. 3. Generalized volume loss without a clear lobar predilection. Electronically Signed   By: Ulyses Jarred M.D.   On: 12/11/2020 01:38   MR THORACIC SPINE W WO CONTRAST  Result Date: 12/11/2020 CLINICAL DATA:  Difficulty walking. EXAM: MRI THORACIC WITHOUT AND WITH CONTRAST TECHNIQUE: Multiplanar and multiecho pulse sequences of the thoracic spine were obtained without and with intravenous contrast. CONTRAST:  7.13mL GADAVIST  GADOBUTROL 1 MMOL/ML IV SOLN COMPARISON:  None. FINDINGS: Alignment:  Normal Vertebrae: There are numerous metastatic lesions throughout the visualized spine. The largest lesion is at T6 where there is circumferential epidural extension of tumor that effaces the thecal sac and compresses the spinal cord. Cord:  No parenchymal abnormality. Paraspinal and other soft tissues: Negative Disc levels: Aside from the epidural abnormality described above, there is no spinal canal or neural foraminal stenosis. IMPRESSION: 1. Diffuse osseous metastatic disease with largest lesion at T6. 2. Circumferential epidural extension of tumor at T6-T7 with compression of the spinal cord. Critical Value/emergent results were called by telephone at the time of interpretation on 12/11/2020 at 1:38 am to provider [facility], who verbally acknowledged these results. Electronically Signed   By: Ulyses Jarred M.D.   On: 12/11/2020 01:39   MR Lumbar Spine W Wo Contrast  Result Date: 12/11/2020 CLINICAL DATA:  Shortness of breath and lower extremity swelling EXAM: MRI LUMBAR SPINE WITHOUT AND WITH CONTRAST TECHNIQUE: Multiplanar and multiecho pulse sequences of the lumbar spine were obtained without and with intravenous contrast. CONTRAST:  7.3mL GADAVIST GADOBUTROL 1 MMOL/ML IV SOLN COMPARISON:  None. FINDINGS: Segmentation:  Standard Alignment:  Posterior fusion at L4-S1 Vertebrae: Osseous metastases at all levels. The largest lesion is at T12. No pathologic fracture. Conus medullaris and cauda equina: Conus extends to the L1 level. Conus and cauda equina appear normal. Paraspinal and other soft tissues: Negative Disc levels: Limited assessment of the lower lumbar spinal canal due to magnetic susceptibility effects from spinal hardware. No spinal canal or neural foraminal stenosis of the upper lumbar spine. IMPRESSION: 1. Diffuse lumbar osseous metastatic disease without pathologic fracture. 2. No spinal canal or neural foraminal stenosis  of the upper lumbar spine. Electronically Signed   By: Ulyses Jarred M.D.   On: 12/11/2020 01:33   Assessment and Plan:  This is a 70 year old male with  1) metastatic prostate cancer -01/01/2018 Patient received Firmagon loading dose at urologist office  -patient had a prostate biopsy on 02/28/2018 pathology showed prostate adenocarcinoma with androgen deprivation treatment effect.  Adenocarcinoma is present in all core fragments with involvement of 50% or more of each core. Perineural invasion is present. As patient has received Firmagon prior to biopsy, Gleason grade cannot be reliably assessed after androgen deprivation therapy. -September 2019  external Beam radiation. He was scheduled to have I-125 interstitial implant although that can not been done so patient underwent external beam IMRT prostate boost, finished in January 2020.  -patient was on androgen deprivation therapy with Mills Koller injections, until August 2020. Declined ADT due to vasomotor symptoms.  -04/28/2019 CT chest abdomen pelvis and bone scan consistent with metastatic prostate cancer. -05/14/2020- 10/05/2019  6 cycles of Docetaxel treatments -01/06/20 US renal showed soft tissue lesion within the posterior wall of bladder suspicious for underlying mass. He declined urology referral or additional work up.  -He agreed to try [place].  Took for 2 to 3 weeks but did not tolerate and stopped. -Declined any additional treatment enrolled in hospice. -12/10/2020 MRI of the thoracic and lumbar spine showed "1. Diffuse osseous metastatic disease with largest lesion at T6. 2. Circumferential epidural extension of tumor at T6-T7 with compression of the spinal cord. -Palliative radiation to T6 under care of Dr. Lisbeth Mitchell starting 12/12/2020.  2) goals of care  PLAN: -Discussed MRI findings with the patient.  We discussed proceeding with radiation to his T6 cord compression.  He is aware that treatment is palliative. -Discussed with patient  about additional treatment for his metastatic prostate cancer.  He has expressed that he is no longer interested in hospice.  He states that he wants to live as long as possible and is interested in talking again about therapies for his metastatic prostate cancer. -We discussed  that additional therapy would not be curative and that there may be side effects associated with treatment.  He is willing to have additional discussion regarding treatment options.  He prefers to follow-up in [place] which is closer to his home. -We will obtain a PSA with next lab draw given that he has not had a PSA since October 2021. -The patient will be seen later today by Dr. Irene Limbo and may also consider restaging CT chest/abdomen/pelvis as well as bone scan as new baseline.  Thank you for this referral.   Mikey Bussing, DNP, AGPCNP-BC, AOCNP   ADDENDUM  .Patient was Personally and independently interviewed, examined and relevant elements of the history of present illness were reviewed in details and an assessment and plan was created. All elements of the patient's history of present illness , assessment and plan were discussed in details with Mikey Bussing, DNP, AGPCNP-BC, AOCNP. The above documentation reflects our combined findings assessment and plan. PSA has increased from 14.5 to 220.24 Patient started radiation as per radiation oncology for palliative treatment of his cord compression.  He notes that he lives with his lady girlfriend who has an LVAD and they support each other.  He notes that he now wants to live longer and wants to reconsider his decision about hospice and would like to follow-up with Dr. Tasia Mitchell for discussion regarding additional palliative systemic therapies upon discharge. He notes he is working with physical therapy to determine his discharge needs and feels like there is some increased strength in his lower extremities since he has been started on steroids and radiation.  He notes he is  eating okay.  No other acute new focal symptoms. He is agreeable with getting a CT chest abdomen pelvis for systemic restaging for Dr. Tasia Mitchell to determine next lines of treatment.  Patient will need follow-up with Dr. Tasia Mitchell upon discharge at [facility] in [place].  Sullivan Lone MD MS

## 2020-12-12 NOTE — Evaluation (Signed)
Physical Therapy Evaluation Patient Details Name: Terry Mitchell MRN: [id number] DOB: Apr 04, 1951 Today's Date: 12/12/2020   History of Present Illness  70 y.o. male with medical history significant of metastatic prostatic cancer and hypertension as well as depression presenting with increasing difficulty with ambulation.   Imaging notable for circumferential epidural extension of tumor at T6-7 with compression of spinal cord (diffuse osseous metastatic disease with largest lesion at T6 -- L spine imaging with diffuse lumbar osseous metastatic disease without pathologic fracture -- MRI brain without acute intracranial or intracranial metastatic disease. NS consulted and recommended steroids/ radiaiton onconolocy consult  Clinical Impression  Pt admitted with above diagnosis.  Pt is pleasant and motivated, requiring min-mod assist for stand pivot transfers today d/t bil LE weakness and decr sensation/coordination. Pt has been a hospice pt however tells NP today that he wants full scope tx. Will continue to follow in acute setting to maximize pt safety and independence and adjust plan accordingly pending GOC.   Pt currently with functional limitations due to the deficits listed below (see PT Problem List). Pt will benefit from skilled PT to increase their independence and safety with mobility to allow discharge to the venue listed below.       Follow Up Recommendations SNF;Home health PT (pending progress and GOC)    Equipment Recommendations  Other (comment) (TBD)    Recommendations for Other Services       Precautions / Restrictions Precautions Precautions: Fall Restrictions Weight Bearing Restrictions: No      Mobility  Bed Mobility Overal bed mobility: Needs Assistance Bed Mobility: Supine to Sit     Supine to sit: Min guard     General bed mobility comments: for safety, incr time    Transfers Overall transfer level: Needs assistance Equipment used: Rolling walker (2  wheeled) Transfers: Sit to/from [facility] Sit to Stand: Min assist Stand pivot transfers: Min assist;Mod assist       General transfer comment: repeated sit to stand x2, cues for hand placement and bil LE position. ataxic type movements/diminished coordination/proprioception bil LEs during stand-step pivot transfer. heavy reliance on UEs  Ambulation/Gait Ambulation/Gait assistance: Min assist           General Gait Details: a few steps forward and back for standpivot  Stairs            Wheelchair Mobility    Modified Rankin (Stroke Patients Only)       Balance Overall balance assessment: Needs assistance Sitting-balance support: No upper extremity supported;Feet supported Sitting balance-Leahy Scale: Fair       Standing balance-Leahy Scale: Poor Standing balance comment: heavily reliant on UEs, able to wt shift only with UE support                             Pertinent Vitals/Pain Pain Assessment: No/denies pain    Home Living Family/patient expects to be discharged to:: Private residence Living Arrangements: Spouse/significant other Available Help at Discharge: Family Type of Home: House (townhouse)     Technical brewer of Steps: 2 Home Layout: Two level;Bed/bath upstairs Home Equipment: Cane - single point Additional Comments: wife has an LVAD,  pt also reports wife has some early dementia/STM deficits    Prior Function Level of Independence: Independent               Hand Dominance        Extremity/Trunk Assessment   Upper Extremity Assessment Upper  Extremity Assessment: Overall WFL for tasks assessed;Defer to OT evaluation    Lower Extremity Assessment Lower Extremity Assessment: RLE deficits/detail;LLE deficits/detail RLE Deficits / Details: AROM grossly WFL, strength  3+/5 RLE Sensation: decreased proprioception;decreased light touch RLE Coordination: decreased gross motor LLE Deficits /  Details: baseline muscle atrophy compared to RLE (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"); grossly 2+/5 LLE Sensation: decreased light touch;decreased proprioception LLE Coordination: decreased gross motor       Communication   Communication: No difficulties  Cognition Arousal/Alertness: Awake/alert Behavior During Therapy: WFL for tasks assessed/performed Overall Cognitive Status: Within Functional Limits for tasks assessed                                        General Comments      Exercises General Exercises - Lower Extremity Long Arc Quad: AROM;Both;5 reps;Seated Toe Raises: AROM;Both;10 reps;Seated   Assessment/Plan    PT Assessment Patient needs continued PT services  PT Problem List Decreased strength;Decreased mobility;Decreased activity tolerance;Decreased balance;Decreased knowledge of use of DME;Impaired sensation;Decreased coordination       PT Treatment Interventions DME instruction;Therapeutic activities;Gait training;Stair training;Functional mobility training;Therapeutic exercise;Patient/family education;Balance training    PT Goals (Current goals can be found in the Care Plan section)  Acute Rehab PT Goals Patient Stated Goal: be able to walk PT Goal Formulation: With patient Time For Goal Achievement: 12/26/20 Potential to Achieve Goals: Fair    Frequency Min 3X/week   Barriers to discharge        Co-evaluation               AM-PAC PT "6 Clicks" Mobility  Outcome Measure Help needed turning from your back to your side while in a flat bed without using bedrails?: A Little Help needed moving from lying on your back to sitting on the side of a flat bed without using bedrails?: A Little Help needed moving to and from a bed to a chair (including a wheelchair)?: A Little Help needed standing up from a chair using your arms (e.g., wheelchair or bedside chair)?: A Lot Help needed to walk in hospital room?: Total Help needed climbing  3-5 steps with a railing? : Total 6 Click Score: 13    End of Session Equipment Utilized During Treatment: Gait belt Activity Tolerance: Patient tolerated treatment well Patient left: in chair;with call bell/phone within reach;with chair alarm set Nurse Communication: Mobility status PT Visit Diagnosis: Other abnormalities of gait and mobility (R26.89)    Time: 3016-0109 PT Time Calculation (min) (ACUTE ONLY): 26 min   Charges:   PT Evaluation $PT Eval Low Complexity: 1 Low PT Treatments $Therapeutic Activity: 8-22 mins        Baxter Flattery, PT  Acute Rehab Dept ([place]) [phone] Pager [phone]  12/12/2020   [facility] 12/12/2020, 12:40 PM

## 2020-12-13 ENCOUNTER — Ambulatory Visit
Admit: 2020-12-13 | Discharge: 2020-12-13 | Disposition: A | Discharge status: Home / Self Care | Payer: Medicare HMO | Attending: Radiation Oncology | Admitting: Radiation Oncology

## 2020-12-13 ENCOUNTER — Encounter: Payer: Self-pay | Admitting: Oncology

## 2020-12-13 ENCOUNTER — Inpatient Hospital Stay (HOSPITAL_COMMUNITY)

## 2020-12-13 DIAGNOSIS — G9520 Unspecified cord compression: Secondary | ICD-10-CM | POA: Diagnosis not present

## 2020-12-13 LAB — CBC WITH DIFFERENTIAL/PLATELET
Abs Immature Granulocytes: 0.06 10*3/uL (ref 0.00–0.07)
Basophils Absolute: 0 10*3/uL (ref 0.0–0.1)
Basophils Relative: 0 %
Eosinophils Absolute: 0 10*3/uL (ref 0.0–0.5)
Eosinophils Relative: 0 %
HCT: 35.3 % — ABNORMAL LOW (ref 39.0–52.0)
Hemoglobin: 11.3 g/dL — ABNORMAL LOW (ref 13.0–17.0)
Immature Granulocytes: 1 %
Lymphocytes Relative: 5 %
Lymphs Abs: 0.3 10*3/uL — ABNORMAL LOW (ref 0.7–4.0)
MCH: 34.1 pg — ABNORMAL HIGH (ref 26.0–34.0)
MCHC: 32 g/dL (ref 30.0–36.0)
MCV: 106.6 fL — ABNORMAL HIGH (ref 80.0–100.0)
Monocytes Absolute: 0.4 10*3/uL (ref 0.1–1.0)
Monocytes Relative: 6 %
Neutro Abs: 5.8 10*3/uL (ref 1.7–7.7)
Neutrophils Relative %: 88 %
Platelets: 151 10*3/uL (ref 150–400)
RBC: 3.31 MIL/uL — ABNORMAL LOW (ref 4.22–5.81)
RDW: 13.1 % (ref 11.5–15.5)
WBC: 6.5 10*3/uL (ref 4.0–10.5)
nRBC: 0 % (ref 0.0–0.2)

## 2020-12-13 LAB — COMPREHENSIVE METABOLIC PANEL
ALT: 16 U/L (ref 0–44)
AST: 24 U/L (ref 15–41)
Albumin: 3.4 g/dL — ABNORMAL LOW (ref 3.5–5.0)
Alkaline Phosphatase: 124 U/L (ref 38–126)
Anion gap: 9 (ref 5–15)
BUN: 30 mg/dL — ABNORMAL HIGH (ref 8–23)
CO2: 24 mmol/L (ref 22–32)
Calcium: 9.2 mg/dL (ref 8.9–10.3)
Chloride: 104 mmol/L (ref 98–111)
Creatinine, Ser: 1.19 mg/dL (ref 0.61–1.24)
GFR, Estimated: >60 mL/min (ref >60)
Glucose, Bld: 134 mg/dL — ABNORMAL HIGH (ref 70–99)
Potassium: 4.5 mmol/L (ref 3.5–5.1)
Sodium: 137 mmol/L (ref 135–145)
Total Bilirubin: 0.3 mg/dL (ref 0.3–1.2)
Total Protein: 6.1 g/dL — ABNORMAL LOW (ref 6.5–8.1)

## 2020-12-13 LAB — PHOSPHORUS: Phosphorus: 3.9 mg/dL (ref 2.5–4.6)

## 2020-12-13 LAB — PSA: Prostatic Specific Antigen: 220.24 ng/mL — ABNORMAL HIGH (ref 0.00–4.00)

## 2020-12-13 LAB — GLUCOSE, CAPILLARY
Glucose-Capillary: 122 mg/dL — ABNORMAL HIGH (ref 70–99)
Glucose-Capillary: 140 mg/dL — ABNORMAL HIGH (ref 70–99)
Glucose-Capillary: 126 mg/dL — ABNORMAL HIGH (ref 70–99)
Glucose-Capillary: 128 mg/dL — ABNORMAL HIGH (ref 70–99)

## 2020-12-13 LAB — MAGNESIUM: Magnesium: 2.2 mg/dL (ref 1.7–2.4)

## 2020-12-13 IMAGING — CT CT CHEST-ABD-PELV W/O CM
2 of 4 series · 14 of 36 positions shown, 16 images · non-contrast
Comparison: CT abdomen pelvis [DATE], CT chest [DATE]

CLINICAL DATA: Prostate cancer, restaging examination

EXAM:
CT CHEST, ABDOMEN AND PELVIS WITHOUT CONTRAST
TECHNIQUE: Multidetector CT imaging of the chest, abdomen and pelvis was
performed following the standard protocol without IV contrast.

[Series 2: cap w/o · axial · non-contrast · 0.95mm/px · z∈[+1136,+1686]mm · 11 of 133 slices shown, 13 images]
[im 12/133  mediastinal]
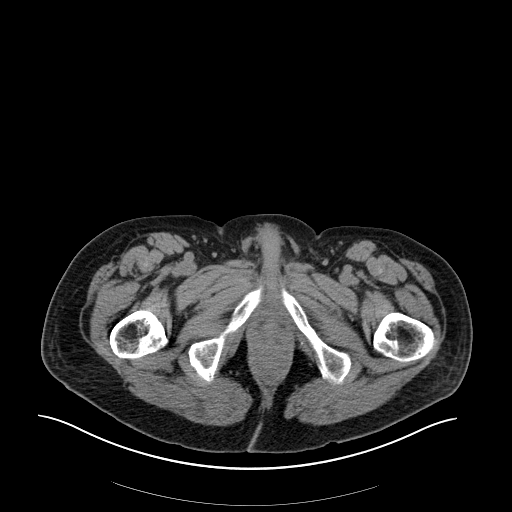
[im 12/133  bone]
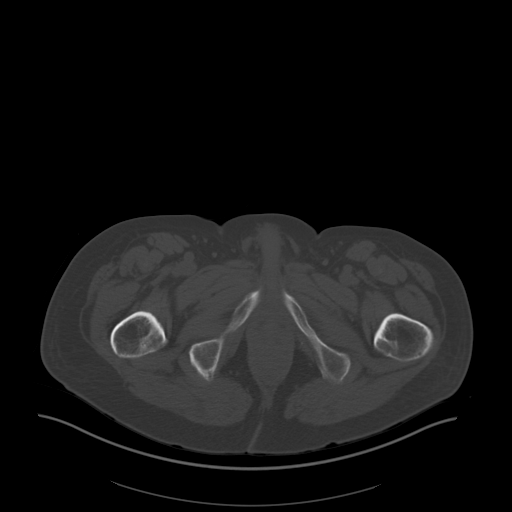
[im 23/133  mediastinal]
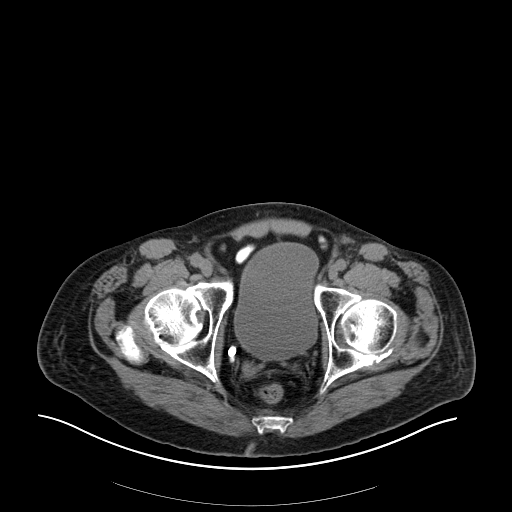
[im 34/133  mediastinal]
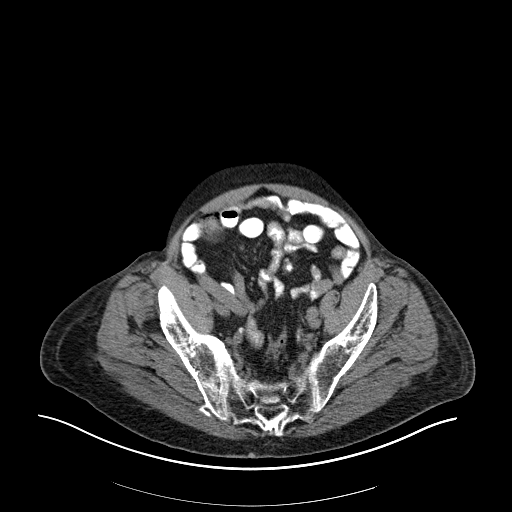
[im 45/133  mediastinal]
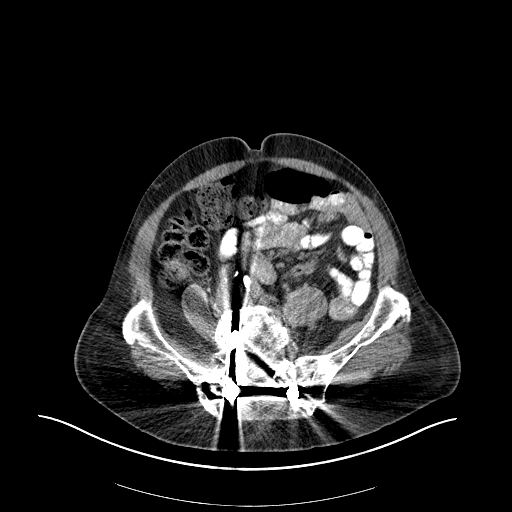
[im 56/133  mediastinal]
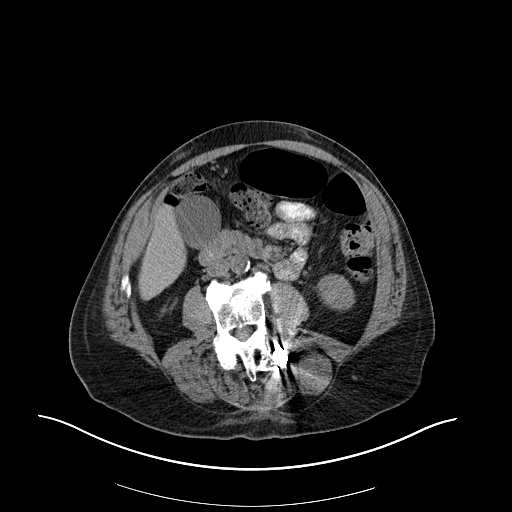
[im 67/133  mediastinal]
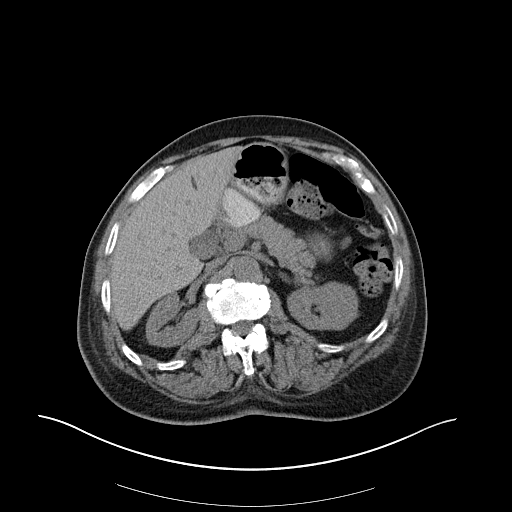
[im 78/133  mediastinal]
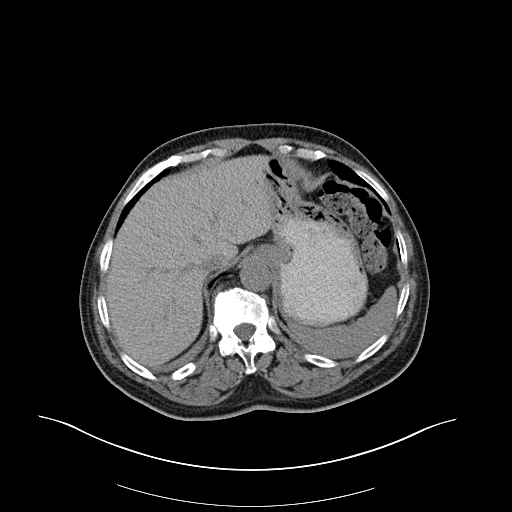
[im 89/133  mediastinal]
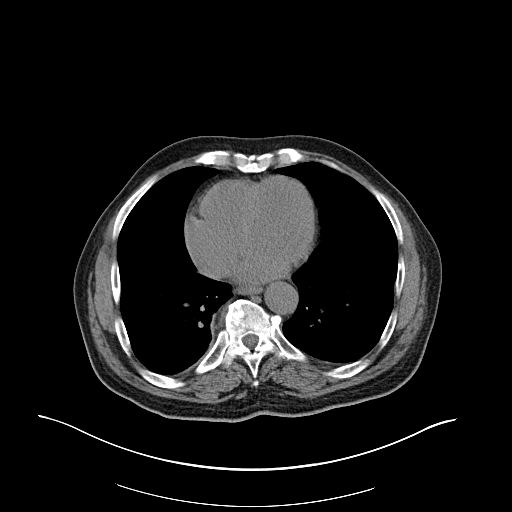
[im 100/133  mediastinal]
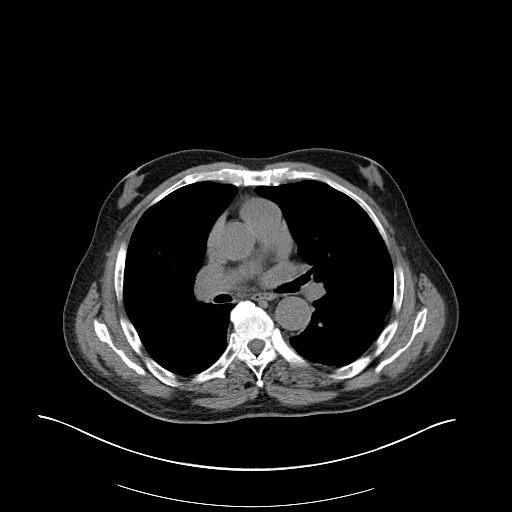
[im 100/133  bone]
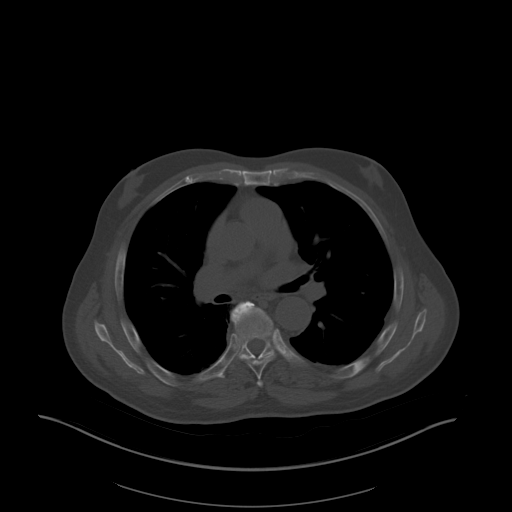
[im 111/133  mediastinal]
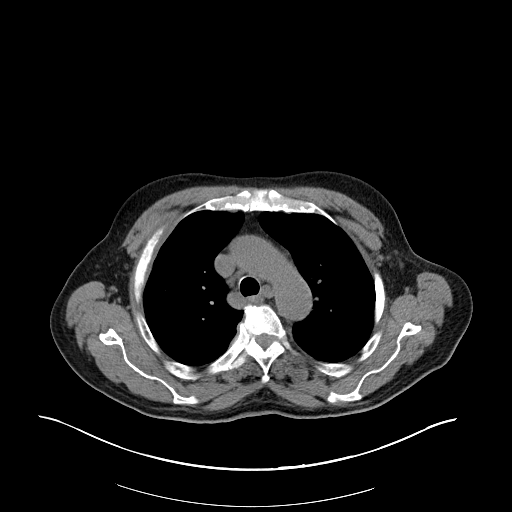
[im 122/133  mediastinal]
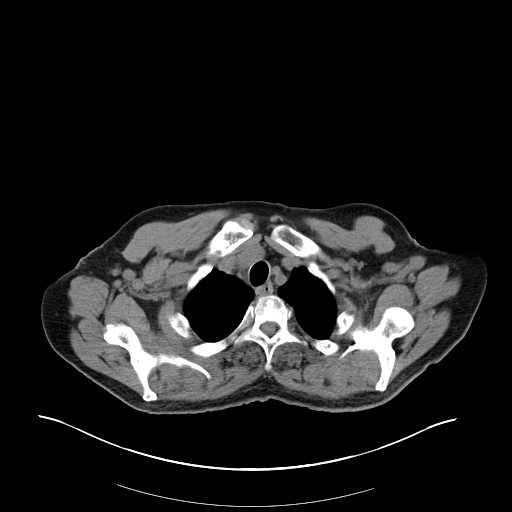

[Series 5: coronals · coronal · 0.86mm/px · 3 of 169 slices shown]
[im 34/169  mediastinal]
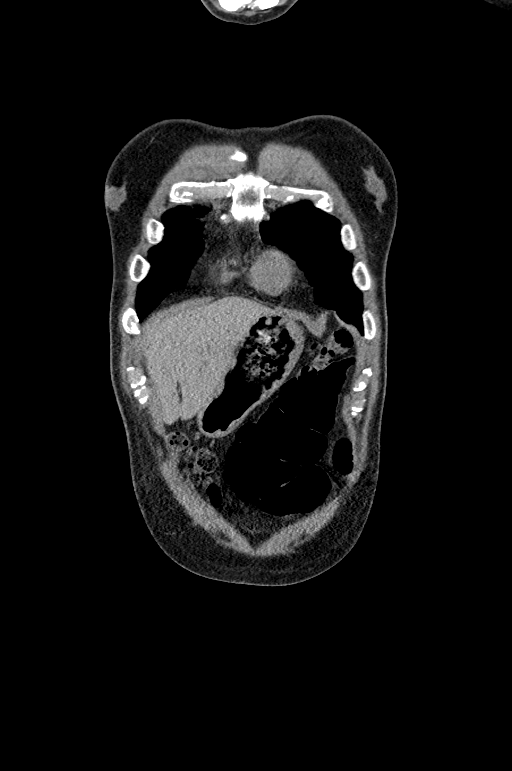
[im 68/169  mediastinal]
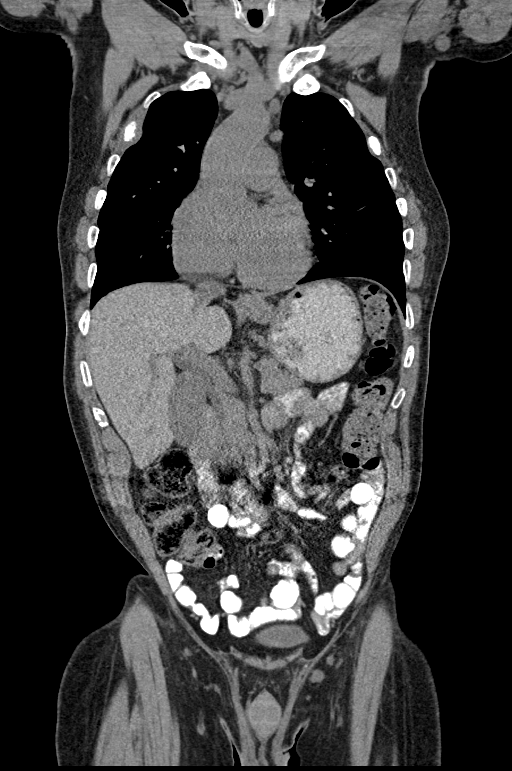
[im 101/169  mediastinal]
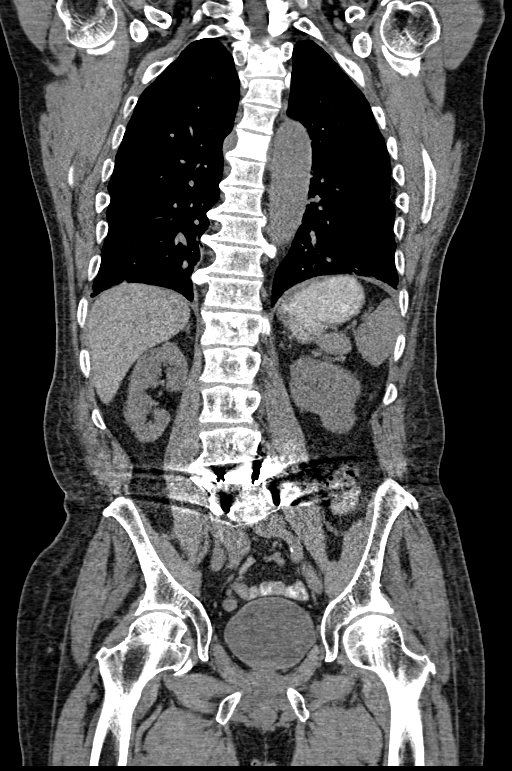

[14 of 36 positions shown; findings below may reference images not displayed]

FINDINGS: CT CHEST FINDINGS

Cardiovascular: Moderate coronary artery calcification. Global
cardiac size within normal limits. No pericardial effusion. Central
pulmonary arteries are of normal caliber. Mild atherosclerotic
calcification within the thoracic aorta. No aortic aneurysm.

Mediastinum/Nodes: There is progressive thoracic adenopathy
involving the right paratracheal and prevascular lymph node groups.
By example, a high right paratracheal lymph node measures 12 mm in
short axis diameter on image # [DATE] (previously measuring 7 mm). A
prevascular lymph node measures 9 mm in short axis diameter, image #
[DATE] (previously measuring 4 mm). The esophagus is unremarkable.
Visualized thyroid is unremarkable.

Lungs/Pleura: Mild centrilobular emphysema. Mild bibasilar
atelectasis. No suspicious focal pulmonary nodules or infiltrates.
No pneumothorax or pleural effusion. Central airways are widely
patent.

Musculoskeletal: Multiple sclerotic metastases are again identified
throughout the visualized axial skeleton. Progressive sclerosis and
permeative changes seen involving the anterolateral third rib with
associated pathologic fracture now evident. New sclerotic metastasis
within the anterolateral fourth rib. New sclerotic foci within the
left twelfth, tenth and fourth ribs as well as increasing sclerosis
involving the left third rib and sixth rib. New sclerotic metastasis
within the tenth vertebral body. Extensive sclerosis with associated
paravertebral soft tissue again noted at T6 similar 2 that noted on
prior examination. Epidural extension of the soft tissue component
at this level is better appreciated on prior MRI examination of
[DATE].

CT ABDOMEN PELVIS FINDINGS

Hepatobiliary: No focal liver abnormality is seen. No gallstones,
gallbladder wall thickening, or biliary dilatation.

Pancreas: Unremarkable

Spleen: Unremarkable

Adrenals/Urinary Tract: Adrenal glands are unremarkable. Kidneys are
normal, without renal calculi, focal lesion, or hydronephrosis.
Bladder is unremarkable.

Stomach/Bowel: Stomach is within normal limits. Appendix appears
normal. No evidence of bowel wall thickening, distention, or
inflammatory changes. No free intraperitoneal gas or fluid.

Vascular/Lymphatic: Mild aortoiliac atherosclerotic calcification.
No aortic aneurysm. No pathologic thoracic adenopathy within the
abdomen and pelvis.

Reproductive: Prostate is unremarkable.

Other: No abdominal wall hernia.

Musculoskeletal: Lumbar fusion with instrumentation and posterior
decompression of L5 again noted. Multiple sclerotic metastases are
noted within the lumbar spine and pelvis bilaterally. No pathologic
fracture.
IMPRESSION: Interval disease progression with progressive sclerotic metastases
within the thorax and progressive borderline mediastinal adenopathy.
Mixed lytic and sclerotic lesion involving the T6 vertebral body
with associated paravertebral soft tissue mass and epidural
extension is not significantly changed from prior examination. New
minimally displaced pathologic fracture of the a right third rib.
Widespread sclerotic metastases within the lumbar spine and pelvis
appears grossly stable.

Moderate coronary artery calcification.

Mild centrilobular emphysema.

Aortic Atherosclerosis ([DJ]-[DJ]).

## 2020-12-13 MED ORDER — IOHEXOL 9 MG/ML PO SOLN
500.0000 mL | ORAL | Status: AC
  Administered 2020-12-13 (×2): 500 mL via ORAL

## 2020-12-13 NOTE — Progress Notes (Addendum)
PROGRESS NOTE    Terry Mitchell  IFO:[id number] DOB: 1951-05-19 DOA: 12/11/2020 PCP: [name], MD   No chief complaint on file.  Brief Narrative: Terry Mitchell is Terry Mitchell 70 y.o. male with medical history significant of metastatic prostatic cancer and hypertension as well as depression presenting with increasing difficulty with ambulation.  Imaging showed cord compression.  He's been admitted and started on steroids and radiation therapy.  He was previously on hospice, but now would like to pursue further treatment with oncology.  Assessment & Plan:   Active Problems:   Prostate cancer [facility])   Prostate cancer metastatic to bone [facility])   Cord compression [facility])  Metastatic Prostate Cancer with Compression of Spinal Cord  Difficulty Ambulating Numbness, difficulty ambulating Imaging notable for circumferential epidural extension of tumor at T6-7 with compression of spinal cord (diffuse osseous metastatic disease with largest lesion at T6 -- L spine imaging with diffuse lumbar osseous metastatic disease without pathologic fracture -- MRI brain without acute intracranial on intracranial metastatic disease) Per Dr. Joretta Bachelor from [facility], recommended considering high dose steroids, radiation oncology/medical oncology consults as well as palliative care (invasive surgical care not reasonable consideration given lack of control of systemic disease) Subjective mild improvement today, follow with radiation/steroids Steroids, pain control Radiation oncology c/s -> radiation treatment per rad/onc  Consulted Dr. Irene Limbo from oncology who will see him 5/23 -> recommending follow up with Dr. Tasia Catchings to discuss additional palliative systemic therapies, CT chest/abd/pelvis for restaging.  PSA increased to 220.24. Therapy recommending SNF, follow recs  Hyperglycemia  2/2 steroids, follow with SSI, follow A1c 5.7  Hypertension Hold cozar and amlodipine  HLD Hold statin   Goals of care He's revoked  hospice benefits and wants to pursue further disease modifying therapy   DVT prophylaxis: lovenox Code Status: full  Family Communication: none at bedside Disposition:   Status is: Inpatient  Remains inpatient appropriate because:Inpatient level of care appropriate due to severity of illness   Dispo: The patient is from: Home              Anticipated d/c is to: Home              Patient currently is not medically stable to d/c.   Difficult to place patient No       Consultants:   Radiation oncology  Oncology  Palliative Care  Procedures: none  Antimicrobials:  Anti-infectives (From admission, onward)   None         Subjective: No new complaints Still numbness, maybe better?  Objective: Vitals:   12/12/20 2109 12/13/20 0500 12/13/20 0629 12/13/20 1322  BP: (!) 143/79  (!) 155/90 128/77  Pulse: 60  (!) 55 (!) 56  Resp: 17  18   Temp: 98.2 F (36.8 C)  97.6 F (36.4 C) 98.1 F (36.7 C)  TempSrc:    Oral  SpO2: 100%  99% 100%  Weight:  90.1 kg      Intake/Output Summary (Last 24 hours) at 12/13/2020 1838 Last data filed at 12/13/2020 1749 Gross per 24 hour  Intake 1557.39 ml  Output 400 ml  Net 1157.39 ml   Filed Weights   12/13/20 0500  Weight: 90.1 kg    Examination:  General: No acute distress. Cardiovascular: Heart sounds show Terry Mitchell regular rate, and rhythm. No gallops or rubs. No murmurs. No JVD. Lungs: Clear to auscultation bilaterally with good air movement. No rales, rhonchi or wheezes. Abdomen: Soft, nontender, nondistended with normal active bowel sounds.  No masses. No hepatosplenomegaly. Neurological: Alert and oriented 3. Moves all extremities 4, 4+/5 LE strength bilaterally.  Numbness around T6 dermatome. Cranial nerves II through XII grossly intact. Skin: Warm and dry. No rashes or lesions. Extremities: No clubbing or cyanosis. No edema.   Data Reviewed: I have personally reviewed following labs and imaging  studies  CBC: Recent Labs  Lab 12/10/20 1724 12/11/20 0846 12/12/20 0421 12/13/20 0416  WBC 7.3 4.7 6.4 6.5  NEUTROABS 5.7  --  5.5 5.8  HGB 12.4* 13.2 12.0* 11.3*  HCT 37.8* 41.2 37.3* 35.3*  MCV 103.3* 106.2* 105.1* 106.6*  PLT 170 179 168 915    Basic Metabolic Panel: Recent Labs  Lab 12/10/20 1724 12/11/20 0748 12/11/20 0846 12/12/20 0421 12/13/20 0416  NA 138 137  --  139 137  K 4.0 4.8  --  4.8 4.5  CL 102 102  --  104 104  CO2 25 27  --  27 24  GLUCOSE 108* 125*  --  145* 134*  BUN 20 21  --  28* 30*  CREATININE 1.35* 1.25* 1.18 1.33* 1.19  CALCIUM 9.8 9.8  --  9.5 9.2  MG  --  2.2  --   --  2.2  PHOS  --  3.8  --   --  3.9    GFR: Estimated Creatinine Clearance: 65.2 mL/min (by C-G formula based on SCr of 1.19 mg/dL).  Liver Function Tests: Recent Labs  Lab 12/10/20 1724 12/11/20 0748 12/12/20 0421 12/13/20 0416  AST 31 22 19 24   ALT 16 14 13 16   ALKPHOS 151* 153* 141* 124  BILITOT 0.8 0.7 0.4 0.3  PROT 7.4 7.0 6.7 6.1*  ALBUMIN 4.2 4.1 3.9 3.4*    CBG: Recent Labs  Lab 12/12/20 1639 12/12/20 2325 12/13/20 0740 12/13/20 1120 12/13/20 1636  GLUCAP 190* 113* 122* 140* 126*     Recent Results (from the past 240 hour(s))  Resp Panel by RT-PCR (Flu Spence Soberano&B, Covid) Nasopharyngeal Swab     Status: None   Collection Time: 12/10/20  6:33 PM   Specimen: Nasopharyngeal Swab; Nasopharyngeal(NP) swabs in vial transport medium  Result Value Ref Range Status   SARS Coronavirus 2 by RT PCR NEGATIVE NEGATIVE Final    Comment: (NOTE) SARS-CoV-2 target nucleic acids are NOT DETECTED.  The SARS-CoV-2 RNA is generally detectable in upper respiratory specimens during the acute phase of infection. The lowest concentration of SARS-CoV-2 viral copies this assay can detect is 138 copies/mL. Primitivo Merkey negative result does not preclude SARS-Cov-2 infection and should not be used as the sole basis for treatment or other patient management decisions. Saban Heinlen negative result  may occur with  improper specimen collection/handling, submission of specimen other than nasopharyngeal swab, presence of viral mutation(s) within the areas targeted by this assay, and inadequate number of viral copies(<138 copies/mL). Kostantinos Tallman negative result must be combined with clinical observations, patient history, and epidemiological information. The expected result is Negative.  Fact Sheet for Patients:  EntrepreneurPulse.com.au  Fact Sheet for Healthcare Providers:  IncredibleEmployment.be  This test is no t yet approved or cleared by the [place] FDA and  has been authorized for detection and/or diagnosis of SARS-CoV-2 by FDA under an Emergency Use Authorization (EUA). This EUA will remain  in effect (meaning this test can be used) for the duration of the COVID-19 declaration under Section 564(b)(1) of the Act, 21 U.S.C.section 360bbb-3(b)(1), unless the authorization is terminated  or revoked sooner.       Influenza  Opie Maclaughlin by PCR NEGATIVE NEGATIVE Final   Influenza B by PCR NEGATIVE NEGATIVE Final    Comment: (NOTE) The Xpert Xpress SARS-CoV-2/FLU/RSV plus assay is intended as an aid in the diagnosis of influenza from Nasopharyngeal swab specimens and should not be used as Jasha Hodzic sole basis for treatment. Nasal washings and aspirates are unacceptable for Xpert Xpress SARS-CoV-2/FLU/RSV testing.  Fact Sheet for Patients: EntrepreneurPulse.com.au  Fact Sheet for Healthcare Providers: IncredibleEmployment.be  This test is not yet approved or cleared by the [place] FDA and has been authorized for detection and/or diagnosis of SARS-CoV-2 by FDA under an Emergency Use Authorization (EUA). This EUA will remain in effect (meaning this test can be used) for the duration of the COVID-19 declaration under Section 564(b)(1) of the Act, 21 U.S.C. section 360bbb-3(b)(1), unless the authorization is terminated  or revoked.  Performed at [facility], [address] [place], [place] [address]          Radiology Studies: No results found.      Scheduled Meds: . dexamethasone (DECADRON) injection  4 mg Intravenous Q6H  . enoxaparin (LOVENOX) injection  40 mg Subcutaneous Daily  . insulin aspart  0-5 Units Subcutaneous QHS  . insulin aspart  0-9 Units Subcutaneous TID WC  . morphine  15 mg Oral Q12H  . polyethylene glycol  17 g Oral BID  . vitamin B-12  1,000 mcg Oral Daily   Continuous Infusions:    LOS: 2 days    Time spent: over 30 min    Fayrene Helper, MD [facility]   To contact the attending provider between 7A-7P or the covering provider during after hours 7P-7A, please log into the web site [email or web address] and access using universal [place] password for that web site. If you do not have the password, please call the hospital operator.  12/13/2020, 6:38 PM

## 2020-12-13 NOTE — TOC Initial Note (Addendum)
Transition of Care [facility]) - Initial/Assessment Note    Patient Details  Name: Terry Mitchell MRN: [id number] Date of Birth: Nov 29, 1950  Transition of Care [facility]) CM/SW Contact:    Lennart Pall, LCSW Phone Number: 12/13/2020, 1:21 PM  Clinical Narrative:                 Met with pt this morning to introduce self/ role.  Pt very pleasant and agreeable to SW support.  Pt reports that he lives with his "lady friend" in [place].  He notes that this friend has a LVAD system and a "little dementia... I look after things".  He does report that she still drives but he has to "tell her where to turn sometimes."  His daughter, [name], is supportive but is not local.  His primary goal is to continue with full cancer care and to dc home. Per pt agreement, I have spoken with his daughter, [name], who confirms the home situation.  She is concerned about the limitations of his girlfriend and does not feel girlfriend should be considered a support person to pt or a safe person to provide transportation.  We discussed PT recommendations for SNF rehab due to his current level of weakness.  I explained that pt is very focused on a home dc and daughter prefers that as well, however, notes he may need to go to her home.  Daughter adds that pt lives in a two level apartment and must be able to handle stairs to his bedroom/ bathroom.   At this point, would like to see if pt could have another session with PT (and OT if possible) to determine if overall strength is improved.  As noted, dc to daughter's home may be a possibility.  Continue to follow.  Expected Discharge Plan: [place] (vs. SNF) Barriers to Discharge: Continued Medical Work up   Patient Goals and CMS Choice Patient states their goals for this hospitalization and ongoing recovery are:: pt goal is to return directly home      Expected Discharge Plan and Services Expected Discharge Plan: [place] (vs.  SNF) In-house Referral: Clinical Social Work     Living arrangements for the past 2 months: Apartment                                      Prior Living Arrangements/Services Living arrangements for the past 2 months: Apartment Lives with:: Significant Other Patient language and need for interpreter reviewed:: Yes Do you feel safe going back to the place where you live?: Yes      Need for Family Participation in Patient Care: Yes (Comment) Care giver support system in place?: No (comment)   Criminal Activity/Legal Involvement Pertinent to Current Situation/Hospitalization: No - Comment as needed  Activities of Daily Living Home Assistive Devices/Equipment: None ADL Screening (condition at time of admission) Patient's cognitive ability adequate to safely complete daily activities?: Yes Is the patient deaf or have difficulty hearing?: No Does the patient have difficulty seeing, even when wearing glasses/contacts?: No Does the patient have difficulty concentrating, remembering, or making decisions?: No Patient able to express need for assistance with ADLs?: Yes Does the patient have difficulty dressing or bathing?: No Independently performs ADLs?: Yes (appropriate for developmental age) Does the patient have difficulty walking or climbing stairs?: Yes Weakness of Legs: Both Weakness of Arms/Hands: None  Permission Sought/Granted Permission  sought to share information with : Family Supports Permission granted to share information with : Yes, Verbal Permission Granted  Share Information with NAME: Docia Furl     Permission granted to share info w Relationship: daughter  Permission granted to share info w Contact Information: [phone]  Emotional Assessment Appearance:: Appears stated age Attitude/Demeanor/Rapport: Gracious Affect (typically observed): Accepting,Hopeful Orientation: : Oriented to Self,Oriented to Place,Oriented to  Time,Oriented to  Situation Alcohol / Substance Use: Not Applicable Psych Involvement: No (comment)  Admission diagnosis:  Cord compression [facility]) [G95.20] Patient Active Problem List   Diagnosis Date Noted  . Cord compression ([place]) 12/11/2020  . Stage 3b chronic kidney disease ([place]) 04/06/2020  . Encounter for antineoplastic chemotherapy 04/06/2020  . Hot flash in male 07/27/2019  . Prostate cancer metastatic to bone ([place]) 05/15/2019  . Prostate cancer ([place]) 03/19/2018  . Androgen deprivation therapy 03/19/2018  . PSA elevation 03/19/2018  . Goals of care, counseling/discussion 03/19/2018  . Chest pain 02/20/2018   PCP:  [name], MD Pharmacy:   [facility] [address] ([address]), [place])  [address] Phone: [phone] Fax: [place] [address] Phone: [phone] Fax: [phone]     Social Determinants of Health (SDOH) Interventions    Readmission Risk Interventions No flowsheet data found.

## 2020-12-13 NOTE — Consult Note (Signed)
Palliative Care Consult Note  Reason for consult: Establishing goals of care  Palliative care consult received.  Chart reviewed including personal review of pertinent labs and imaging.  Briefly, Terry Mitchell is a 70 year old male with past medical history of metastatic prostate cancer, hypertension, and depression who presented to the hospital with weakness, back pain and difficulty walking.  Work-up revealed diffuse osseous metastatic disease with largest lesion at T6 and epidural extension at T6-7 with compression of the spinal cord.  He was transferred to [facility] for emergent initiation of radiation.  He was previously on hospice services with [facility] but is noted to have revoked services earlier today.  I met today with Terry Mitchell.   We discussed clinical course as well as wishes moving forward in regard to advanced directives.  Concepts specific to code status and rehospitalization discussed.  We discussed difference between a aggressive medical intervention path and a palliative, comfort focused care path.  Values and goals of care important to patient and family were attempted to be elicited.  He tells me that he feels that he has time left and wants to pursue any offered medical interventions.  He wants to spend time with his significant other and children.  He had just bought a motorcycle and wants to be able to enjoy it.    Questions and concerns addressed.   PMT will continue to support holistically.  - Full code/full scope.  He would not want to be on any form of life support long term but desires trail of heroic interventions in the event of cardiac or respiratory arrest.  He reports that his daughter is his surrogate and she would know what to do if it appears that he were not going to be able to be liberated from life support. - He has revoked hospice services and wants to pursue any offered medical interventions. - No other palliative specific recommendations at  this time.  Please call for any palliative specific needs with which we can be of assistance.  Start time: 1540 End time: 1620 Total time: 40 min  Greater than 50%  of this time was spent counseling and coordinating care related to the above assessment and plan.  Micheline Rough, MD [place] Team [phone]

## 2020-12-13 NOTE — Progress Notes (Signed)
As noted elsewhere, Mr. Adami has elected to revoke hospice and wants to pursue any and all aggressive interventions.  -Full code/Full scope treatment -Has revoked his hospice benefits -Would like to pursue further disease modifying therapy for his cancer  Full note to follow.  Micheline Rough, MD [place] Team [phone]

## 2020-12-14 ENCOUNTER — Ambulatory Visit
Admit: 2020-12-14 | Discharge: 2020-12-14 | Disposition: A | Discharge status: Home / Self Care | Payer: Medicare HMO | Attending: Radiation Oncology | Admitting: Radiation Oncology

## 2020-12-14 ENCOUNTER — Encounter: Payer: Self-pay | Admitting: Oncology

## 2020-12-14 DIAGNOSIS — C61 Malignant neoplasm of prostate: Secondary | ICD-10-CM | POA: Diagnosis not present

## 2020-12-14 DIAGNOSIS — C7951 Secondary malignant neoplasm of bone: Secondary | ICD-10-CM | POA: Diagnosis not present

## 2020-12-14 DIAGNOSIS — G893 Neoplasm related pain (acute) (chronic): Secondary | ICD-10-CM

## 2020-12-14 DIAGNOSIS — G9520 Unspecified cord compression: Secondary | ICD-10-CM | POA: Diagnosis not present

## 2020-12-14 LAB — CBC WITH DIFFERENTIAL/PLATELET
Abs Immature Granulocytes: 0.05 10*3/uL (ref 0.00–0.07)
Basophils Absolute: 0 10*3/uL (ref 0.0–0.1)
Basophils Relative: 0 %
Eosinophils Absolute: 0 10*3/uL (ref 0.0–0.5)
Eosinophils Relative: 0 %
HCT: 35 % — ABNORMAL LOW (ref 39.0–52.0)
Hemoglobin: 11.1 g/dL — ABNORMAL LOW (ref 13.0–17.0)
Immature Granulocytes: 1 %
Lymphocytes Relative: 5 %
Lymphs Abs: 0.3 10*3/uL — ABNORMAL LOW (ref 0.7–4.0)
MCH: 33.4 pg (ref 26.0–34.0)
MCHC: 31.7 g/dL (ref 30.0–36.0)
MCV: 105.4 fL — ABNORMAL HIGH (ref 80.0–100.0)
Monocytes Absolute: 0.4 10*3/uL (ref 0.1–1.0)
Monocytes Relative: 7 %
Neutro Abs: 4.5 10*3/uL (ref 1.7–7.7)
Neutrophils Relative %: 87 %
Platelets: 145 10*3/uL — ABNORMAL LOW (ref 150–400)
RBC: 3.32 MIL/uL — ABNORMAL LOW (ref 4.22–5.81)
RDW: 13 % (ref 11.5–15.5)
WBC: 5.2 10*3/uL (ref 4.0–10.5)
nRBC: 0 % (ref 0.0–0.2)

## 2020-12-14 LAB — GLUCOSE, CAPILLARY
Glucose-Capillary: 125 mg/dL — ABNORMAL HIGH (ref 70–99)
Glucose-Capillary: 139 mg/dL — ABNORMAL HIGH (ref 70–99)
Glucose-Capillary: 122 mg/dL — ABNORMAL HIGH (ref 70–99)

## 2020-12-14 LAB — MAGNESIUM: Magnesium: 2.2 mg/dL (ref 1.7–2.4)

## 2020-12-14 LAB — COMPREHENSIVE METABOLIC PANEL
ALT: 22 U/L (ref 0–44)
AST: 25 U/L (ref 15–41)
Albumin: 3.4 g/dL — ABNORMAL LOW (ref 3.5–5.0)
Alkaline Phosphatase: 122 U/L (ref 38–126)
Anion gap: 8 (ref 5–15)
BUN: 32 mg/dL — ABNORMAL HIGH (ref 8–23)
CO2: 27 mmol/L (ref 22–32)
Calcium: 9.1 mg/dL (ref 8.9–10.3)
Chloride: 100 mmol/L (ref 98–111)
Creatinine, Ser: 1.32 mg/dL — ABNORMAL HIGH (ref 0.61–1.24)
GFR, Estimated: 59 mL/min — ABNORMAL LOW (ref >60)
Glucose, Bld: 121 mg/dL — ABNORMAL HIGH (ref 70–99)
Potassium: 4.7 mmol/L (ref 3.5–5.1)
Sodium: 135 mmol/L (ref 135–145)
Total Bilirubin: 0.4 mg/dL (ref 0.3–1.2)
Total Protein: 6 g/dL — ABNORMAL LOW (ref 6.5–8.1)

## 2020-12-14 LAB — PHOSPHORUS: Phosphorus: 3.7 mg/dL (ref 2.5–4.6)

## 2020-12-14 MED ORDER — AMLODIPINE BESYLATE 5 MG PO TABS
5.0000 mg | ORAL_TABLET | Freq: Every day | ORAL | Status: DC
  Administered 2020-12-14 – 2020-12-20 (×7): 5 mg via ORAL
  Filled 2020-12-14 (×7): qty 1

## 2020-12-14 NOTE — Progress Notes (Signed)
PROGRESS NOTE  Terry Mitchell HFW:[id number] DOB: 10-17-1950 DOA: 12/11/2020 PCP: [name], MD  Brief History   70 year old man presented to [facility] PMH metastatic prostate cancer presented with increasing difficulty with ambulation.  Found to have cord compression secondary to metastatic prostate cancer.  Transferred to [facility] for urgent initiation of XRT, no role for neurosurgery at this time.  Seen by radiation oncology, started on XRT.  Seen by oncology with recommendation for outpatient follow-up with primary oncologist.  CIR recommended.  A & P  Spinal cord compression secondary to circumferential epidural extension of tumor at T6-T7, metastatic prostate cancer with diffuse osseous metastatic disease largest lesion T6  --continue XRT as per radiation oncology, steroids. --f/u with Dr. Tasia Catchings as outpatient --PT, OT recommending CIR.  Chronic pain secondary to malignancy --Stable.  Continue MS Contin, breakthrough analgesia  Steroid-induced hyperglycemia --Insignificant.  Has not required insulin. -- Stop CBG checks and sliding scale insulin.  Essential hypertension -- Stable  Goals of care --Full scope  Disposition Plan:  Discussion:   Status is: Inpatient  Remains inpatient appropriate because:Inpatient level of care appropriate due to severity of illness  Dispo: The patient is from: Home              Anticipated d/c is to: CIR              Patient currently is not medically stable to d/c.   Difficult to place patient No  DVT prophylaxis: enoxaparin (LOVENOX) injection 40 mg Start: 12/11/20 1000   Code Status: Full Code Level of care: Med-Surg Family Communication: none  [name], MD  [facility] Direct contact: see [email or web address] (further directions at bottom of note if needed) 7PM-7AM contact night coverage as at bottom of note 12/14/2020, 5:56 PM  LOS: 3 days   Significant Hospital Events   .    Consults:  .    Procedures:   .   Significant Diagnostic Tests:  Marland Kitchen    Micro Data:  .    Antimicrobials:  .   Interval History/Subjective  CC: cancer   f/u feels better, moving ok, eating ok  Objective   Vitals:  Vitals:   12/14/20 0527 12/14/20 1421  BP: (!) 163/91 138/85  Pulse: (!) 51 (!) 50  Resp: 18   Temp: 97.7 F (36.5 C) 97.8 F (36.6 C)  SpO2: 100% 98%    Exam:  Constitutional:   . Appears calm and comfortable sitting in chair ENMT:  . grossly normal hearing  Respiratory:  . CTA bilaterally, no w/r/r.  . Respiratory effort normal.  Cardiovascular:  . RRR, no m/r/g . No LE extremity edema   Psychiatric:  . Mental status o Mood, affect appropriate   I have personally reviewed the following:   Today's Data  . CBG stable . Creatinine stable at 1.32 remainder CMP unremarkable . CBC stable  Scheduled Meds: . amLODipine  5 mg Oral Daily  . dexamethasone (DECADRON) injection  4 mg Intravenous Q6H  . enoxaparin (LOVENOX) injection  40 mg Subcutaneous Daily  . morphine  15 mg Oral Q12H  . polyethylene glycol  17 g Oral BID  . vitamin B-12  1,000 mcg Oral Daily   Continuous Infusions:  Principal Problem:   Cord compression [facility]) Active Problems:   Prostate cancer metastatic to bone (HCC)   Pain of metastatic malignancy   LOS: 3 days   How to contact the [facility] Attending or Consulting provider [place] or covering provider  during after hours 7P -7A, for this patient?  1. Check the care team in [facility] and look for a) attending/consulting TRH provider listed and b) the [facility] team listed 2. Log into [email or web address] and use [place]'s universal password to access. If you do not have the password, please contact the hospital operator. 3. Locate the [facility] provider you are looking for under [facility] and page to a number that you can be directly reached. 4. If you still have difficulty reaching the provider, please page the [facility] (Director on Call) for the Hospitalists listed on  amion for assistance.

## 2020-12-14 NOTE — Evaluation (Signed)
Occupational Therapy Evaluation Patient Details Name: Terry Mitchell MRN: [id number] DOB: 1951/03/27 Today's Date: 12/14/2020    History of Present Illness Pt is 70 y.o. male with medical history significant of metastatic prostatic cancer and hypertension as well as depression presenting with increasing difficulty with ambulation on 12/11/20.   Imaging notable for circumferential epidural extension of tumor at T6-7 with compression of spinal cord (diffuse osseous metastatic disease with largest lesion at T6 -- L spine imaging with diffuse lumbar osseous metastatic disease without pathologic fracture -- MRI brain without acute intracranial on intracranial metastatic disease. NS consulted and recommended steroids/ radiaiton oncology consult   Clinical Impression   Patient is currently requiring assistance with ADLs including min guard to minimal assist with toileting, LE dressing, and with LB bathing, and setup assist with seated grooming and UE dressing, all of which is below patient's typical baseline of being Independent.  During this evaluation, patient was limited by LE diminished sensation and tingling as well as impaired gross motor coordination of lower body with ataxic-like steps while transferring to recliner, which has the potential to impact patient's safety and independence during functional mobility, as well as performance for ADLs. [place] "6-clicks" Daily Activity Inpatient Short Form score of 19/24 indicates 42.80% ADL impairment this session. Patient lives with his significant other who pt states is his wife for all intents and purposes and who is able to provide 24/7 supervision and assistance.  Patient demonstrates good rehab potential, and should benefit from continued skilled occupational therapy services while in acute care to maximize safety, independence and quality of life at home.  Continued occupational therapy services in a CIR setting prior to return home is  recommended.  ?   Follow Up Recommendations  CIR    Equipment Recommendations   (May need a WC depending on discharge plan.)    Recommendations for Other Services Rehab consult     Precautions / Restrictions Precautions Precautions: Fall Restrictions Weight Bearing Restrictions: No      Mobility Bed Mobility Overal bed mobility: Needs Assistance Bed Mobility: Supine to Sit     Supine to sit: Min guard;HOB elevated     General bed mobility comments: in chair at arrival    Transfers Overall transfer level: Needs assistance Equipment used: Rolling walker (2 wheeled) Transfers: Sit to/from Stand Sit to Stand: Min assist Stand pivot transfers: Min assist;Mod assist       General transfer comment: Performed sit to stand x 5 with min A to steady and heavyuse of bil UE    Balance Overall balance assessment: Needs assistance Sitting-balance support: No upper extremity supported;Feet supported Sitting balance-Leahy Scale: Good     Standing balance support: Bilateral upper extremity supported Standing balance-Leahy Scale: Poor Standing balance comment: requiring RW                           ADL either performed or assessed with clinical judgement   ADL Overall ADL's : Needs assistance/impaired Eating/Feeding: Independent   Grooming: Set up;Sitting;Wash/dry hands;Wash/dry face;Oral care Grooming Details (indicate cue type and reason): Pt asked to unlock his recliner. Pt then able to pull himself to sink and perform grooming at chair level. Upper Body Bathing: Set up;Sitting   Lower Body Bathing: Minimal assistance;Sitting/lateral leans;Sit to/from stand   Upper Body Dressing : Set up;Sitting   Lower Body Dressing: Minimal assistance;Sitting/lateral leans;Bed level   Toilet Transfer: Minimal assistance;BSC;RW;Stand-pivot Toilet Transfer Details (indicate cue type and  reason): Pt took lateral step around bed with RW and Min As with verbal cues for  sequencing. Pt then took ~3 forward steps and with Min-Mod As, pivoted to recliner. Toileting- Clothing Manipulation and Hygiene: Minimal assistance;Sitting/lateral lean;Sit to/from stand       Functional mobility during ADLs: Minimal assistance;Rolling walker       Vision Patient Visual Report: No change from baseline Vision Assessment?: No apparent visual deficits     Perception     Praxis      Pertinent Vitals/Pain Pain Assessment: No/denies pain     Hand Dominance Right   Extremity/Trunk Assessment Upper Extremity Assessment Upper Extremity Assessment: Overall WFL for tasks assessed   Lower Extremity Assessment Lower Extremity Assessment: Defer to PT evaluation RLE Deficits / Details: AROM grossly WFL, strength  3+/5 RLE Sensation: decreased proprioception;decreased light touch RLE Coordination: decreased gross motor LLE Deficits / Details: baseline muscle atrophy compared to RLE (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"); grossly 2+/5 LLE Sensation: decreased light touch;decreased proprioception LLE Coordination: decreased gross motor   Cervical / Trunk Assessment Cervical / Trunk Assessment: Normal   Communication Communication Communication: No difficulties   Cognition Arousal/Alertness: Awake/alert Behavior During Therapy: WFL for tasks assessed/performed Overall Cognitive Status: Within Functional Limits for tasks assessed                                 General Comments: Pleasant and motivated   General Comments       Exercises General Exercises - Lower Extremity Ankle Circles/Pumps: AROM;Both;15 reps;Seated Long Arc Quad: AROM;Both;10 reps;Seated Hip ABduction/ADduction: AROM;Both;10 reps;Supine Other Exercises Other Exercises: pillow squeeze x 10, resisted hip ABD in seated position x 10 Other Exercises: Coordination exercises: heel up and down shin bil x 10, target tapping in seated position x 10 each leg (slower and inconsistent on L),  toe tapping   Shoulder Instructions      Home Living Family/patient expects to be discharged to:: Inpatient rehab Living Arrangements: Spouse/significant other Available Help at Discharge: Family Type of Home: House ([facility] in [place])   Technical brewer of Steps: 2   Home Layout: Two level;Bed/bath upstairs Alternate Level Stairs-Number of Steps: flight Alternate Level Stairs-Rails: Right           Home Equipment: Cane - single point;Bedside commode   Additional Comments: wife has an LVAD,  pt also reports wife has some early dementia/STM deficits      Prior Functioning/Environment Level of Independence: Independent                 OT Problem List: Decreased strength;Impaired sensation;Impaired balance (sitting and/or standing)      OT Treatment/Interventions: Self-care/ADL training;Therapeutic exercise;Therapeutic activities;Neuromuscular education;Patient/family education;DME and/or AE instruction;Balance training    OT Goals(Current goals can be found in the care plan section) Acute Rehab OT Goals Patient Stated Goal: be able to walk OT Goal Formulation: With patient Time For Goal Achievement: 12/28/20 Potential to Achieve Goals: Good ADL Goals Pt Will Perform Grooming: standing;with supervision Pt Will Perform Lower Body Dressing: with supervision;sitting/lateral leans;sit to/from stand Pt Will Transfer to Toilet: with supervision;bedside commode;stand pivot transfer;ambulating Pt Will Perform Toileting - Clothing Manipulation and hygiene: with modified independence;with adaptive equipment;sitting/lateral leans;sit to/from stand  OT Frequency: Min 2X/week   Barriers to D/C: Inaccessible home environment  2 story home with 2 steps to enter and bed/bath upstairs. S.O. with LVAD and dementia but  "strong" per pt  Co-evaluation              AM-PAC OT "6 Clicks" Daily Activity     Outcome Measure Help from another person eating  meals?: None Help from another person taking care of personal grooming?: A Little Help from another person toileting, which includes using toliet, bedpan, or urinal?: A Little Help from another person bathing (including washing, rinsing, drying)?: A Little Help from another person to put on and taking off regular upper body clothing?: A Little Help from another person to put on and taking off regular lower body clothing?: A Little 6 Click Score: 19   End of Session Equipment Utilized During Treatment: Gait belt;Rolling walker Nurse Communication: Other (comment) (Permission to leave chair alarm off per pt request to perform chair pushups. RN agreed.)  Activity Tolerance: Patient tolerated treatment well Patient left: in chair;with call bell/phone within reach  OT Visit Diagnosis: Ataxia, unspecified (R27.0);Other abnormalities of gait and mobility (R26.89);Unsteadiness on feet (R26.81)                Time: 5797-2820 OT Time Calculation (min): 33 min Charges:  OT General Charges $OT Visit: 1 Visit OT Evaluation $OT Eval Low Complexity: 1 Low OT Treatments $Self Care/Home Management : 8-22 mins  Anderson Malta, OT Acute Rehab Services Office: [phone] 12/14/2020  Julien Girt 12/14/2020, 1:57 PM

## 2020-12-14 NOTE — Progress Notes (Signed)
Inpatient Rehab Admissions Coordinator Note:   Per therapy recommendations, pt was screened for CIR candidacy by Clemens Catholic, [place] CCC-SLP. At this time, Pt. Appears to have functional decline and is a potential candidate for CIR.I  Will place order for CIR consult per protocol. Please contact me with any questions.    Clemens Catholic, [place], [place] Admissions Coordinator  [phone] ([place]) [phone] (office)

## 2020-12-14 NOTE — Care Management Important Message (Signed)
Important Message  Patient Details IM Letter given to the Patient. Name: Terry Mitchell MRN: [id number] Date of Birth: 06/30/1951   Medicare Important Message Given:  Yes     Kerin Salen 12/14/2020, 11:36 AM

## 2020-12-14 NOTE — Hospital Course (Addendum)
70 year old man presented to [facility] PMH metastatic prostate cancer presented with increasing difficulty with ambulation.  Found to have cord compression secondary to metastatic prostate cancer.  Transferred to [facility] for urgent initiation of XRT, no role for neurosurgery at this time.  Seen by radiation oncology, started on XRT.  Seen by oncology with recommendation for outpatient follow-up with primary oncologist.  CIR recommended.  A & P  Spinal cord compression secondary to circumferential epidural extension of tumor at T6-T7, metastatic prostate cancer with diffuse osseous metastatic disease largest lesion T6  --continue XRT as per radiation oncology, steroids decreased per Dr. Ida Rogue rec. --f/u with Dr. Tasia Catchings as outpatient --PT, OT recommending CIR, await bed, pt medically stable -- A few falls during this hospitalization.  Chronic pain secondary to malignancy -- Stable.  Continue MS Contin, breakthrough analgesia  Essential hypertension -- remains stable  Goals of care --Full scope

## 2020-12-14 NOTE — TOC Progression Note (Signed)
Transition of Care [facility]) - Progression Note    Patient Details  Name: Terry Mitchell MRN: [id number] Date of Birth: [birth year]  Transition of [facility]) CM/SW Contact  Lennart Pall, [place] Phone Number: 12/14/2020, 1:45 PM  Clinical Narrative:    Therapies have seen patient again today and are both recommending CIR - referral placed and await CIR review for appropriateness.  Pt is in full agreement with CIR and very motivated to improve strength and return home.     Expected Discharge Plan: [place] (vs. SNF) Barriers to Discharge: Continued Medical Work up  Expected Discharge Plan and Services Expected Discharge Plan: [place] (vs. SNF) In-house Referral: Clinical Social Work     Living arrangements for the past 2 months: Apartment                                       Social Determinants of Health (SDOH) Interventions    Readmission Risk Interventions No flowsheet data found.

## 2020-12-14 NOTE — Progress Notes (Signed)
Physical Therapy Treatment Patient Details Name: Terry Mitchell MRN: [id number] DOB: Nov 27, 1950 Today's Date: 12/14/2020    History of Present Illness Pt is 70 y.o. male with medical history significant of metastatic prostatic cancer and hypertension as well as depression presenting with increasing difficulty with ambulation on 12/11/20.   Imaging notable for circumferential epidural extension of tumor at T6-7 with compression of spinal cord (diffuse osseous metastatic disease with largest lesion at T6 -- L spine imaging with diffuse lumbar osseous metastatic disease without pathologic fracture -- MRI brain without acute intracranial on intracranial metastatic disease. NS consulted and recommended steroids/ radiaiton oncology consult    PT Comments    Pt demonstrating good improvement in LE strength and ability to mobilize.  He was able to ambulate 40'x3 with RW and min A with seated rest breaks.  Continues to demonstrate bil LE weakness (worse on L), ataxia, decreased coordination, and decreased sensation - but reports is improving with treatments. Pt is very motivated and made significant improvements.  He would like to pursue CIR at d/c. Giving pt's good progress since evaluation (with initiation of steroids and radiation) feel that he may benefit from CIR - updated recommendation.   Pt has support at home, but it is his significant other and she has health problems of her own and unsure level of physical assist she can provide.    Follow Up Recommendations  CIR     Equipment Recommendations  Rolling walker with 5" wheels    Recommendations for Other Services       Precautions / Restrictions Precautions Precautions: Fall    Mobility  Bed Mobility               General bed mobility comments: in chair at arrival    Transfers Overall transfer level: Needs assistance Equipment used: Rolling walker (2 wheeled) Transfers: Sit to/from Stand Sit to Stand: Min assist          General transfer comment: Performed sit to stand x 5 with min A to steady and heavyuse of bil UE  Ambulation/Gait Ambulation/Gait assistance: Min assist;+2 safety/equipment Gait Distance (Feet): 40 Feet (40'x3) Assistive device: Rolling walker (2 wheeled) Gait Pattern/deviations: Step-to pattern;Decreased stride length;Decreased weight shift to left;Ataxic;Narrow base of support;Decreased dorsiflexion - left Gait velocity: decreased   General Gait Details: Pt with ataxic like movements both legs, ambulates with step to L pattern and unsteady knees but no severe buckling, difficulty with weight shift to L and unable to advance to step throught pattern, L leg with increased ataxic movement and tends to adduct with tredelenberg hips leading to narrow BOS and scissoring at times, cued to focus on widening BOS and increasing R foot clearance for safety; had chair follow with seated rest breaks   Stairs             Wheelchair Mobility    Modified Rankin (Stroke Patients Only)       Balance Overall balance assessment: Needs assistance Sitting-balance support: No upper extremity supported;Feet supported Sitting balance-Leahy Scale: Good     Standing balance support: Bilateral upper extremity supported Standing balance-Leahy Scale: Poor Standing balance comment: requiring RW                            Cognition Arousal/Alertness: Awake/alert Behavior During Therapy: WFL for tasks assessed/performed Overall Cognitive Status: Within Functional Limits for tasks assessed  General Comments: Pleasant and motivated      Exercises General Exercises - Lower Extremity Ankle Circles/Pumps: AROM;Both;15 reps;Seated Long Arc Quad: AROM;Both;10 reps;Seated Hip ABduction/ADduction: AROM;Both;10 reps;Supine Other Exercises Other Exercises: pillow squeeze x 10, resisted hip ABD in seated position x 10 Other Exercises: Coordination  exercises: heel up and down shin bil x 10, target tapping in seated position x 10 each leg (slower and inconsistent on L), toe tapping    General Comments        Pertinent Vitals/Pain Pain Assessment: No/denies pain    Home Living                      Prior Function            PT Goals (current goals can now be found in the care plan section) Acute Rehab PT Goals Patient Stated Goal: be able to walk PT Goal Formulation: With patient Time For Goal Achievement: 12/26/20 Potential to Achieve Goals: Good Progress towards PT goals: Progressing toward goals    Frequency    Min 3X/week      PT Plan Discharge plan needs to be updated    Co-evaluation              AM-PAC PT "6 Clicks" Mobility   Outcome Measure  Help needed turning from your back to your side while in a flat bed without using bedrails?: A Little Help needed moving from lying on your back to sitting on the side of a flat bed without using bedrails?: A Little Help needed moving to and from a bed to a chair (including a wheelchair)?: A Little Help needed standing up from a chair using your arms (e.g., wheelchair or bedside chair)?: A Little Help needed to walk in hospital room?: A Little Help needed climbing 3-5 steps with a railing? : A Lot 6 Click Score: 17    End of Session Equipment Utilized During Treatment: Gait belt Activity Tolerance: Patient tolerated treatment well Patient left: in chair;with call bell/phone within reach (did not turn on alarm as pt follows commands and randomly works on tricep push ups in chair) Nurse Communication: Mobility status PT Visit Diagnosis: Other abnormalities of gait and mobility (R26.89);Muscle weakness (generalized) (M62.81)     Time: 4287-6811 PT Time Calculation (min) (ACUTE ONLY): 26 min  Charges:  $Gait Training: 8-22 mins $Therapeutic Exercise: 8-22 mins                     Abran Richard, PT Acute Rehab Services Pager [phone] [facility]  [facility] 12/14/2020, 1:22 PM

## 2020-12-15 ENCOUNTER — Encounter: Payer: Self-pay | Admitting: Oncology

## 2020-12-15 ENCOUNTER — Ambulatory Visit
Admit: 2020-12-15 | Discharge: 2020-12-15 | Disposition: A | Discharge status: Home / Self Care | Payer: Medicare HMO | Attending: Radiation Oncology | Admitting: Radiation Oncology

## 2020-12-15 DIAGNOSIS — C7951 Secondary malignant neoplasm of bone: Secondary | ICD-10-CM | POA: Diagnosis not present

## 2020-12-15 DIAGNOSIS — C61 Malignant neoplasm of prostate: Secondary | ICD-10-CM | POA: Diagnosis not present

## 2020-12-15 DIAGNOSIS — G893 Neoplasm related pain (acute) (chronic): Secondary | ICD-10-CM | POA: Diagnosis not present

## 2020-12-15 DIAGNOSIS — G9520 Unspecified cord compression: Secondary | ICD-10-CM | POA: Diagnosis not present

## 2020-12-15 MED ORDER — DEXAMETHASONE 4 MG PO TABS
4.0000 mg | ORAL_TABLET | Freq: Three times a day (TID) | ORAL | Status: DC
  Administered 2020-12-15 – 2020-12-20 (×14): 4 mg via ORAL
  Filled 2020-12-15 (×15): qty 1

## 2020-12-15 MED ORDER — DEXAMETHASONE 4 MG PO TABS: 4.0000 mg | ORAL_TABLET | Freq: Four times a day (QID) | ORAL | Status: DC

## 2020-12-15 NOTE — Progress Notes (Signed)
IP rehab admissions - I spoke with patient by phone.  He would like CIR admission.  I will open the case and request inpatient rehab admission.  I will update all once I hear back from insurance carrier.  Call for questions.  [phone]

## 2020-12-15 NOTE — Progress Notes (Signed)
PROGRESS NOTE  Terry Mitchell NTI:[id number] DOB: [birth year] DOA: 12/11/2020 PCP: [name], MD  Brief History   70 year old man presented to [facility] PMH metastatic prostate cancer presented with increasing difficulty with ambulation.  Found to have cord compression secondary to metastatic prostate cancer.  Transferred to [facility] for urgent initiation of XRT, no role for neurosurgery at this time.  Seen by radiation oncology, started on XRT.  Seen by oncology with recommendation for outpatient follow-up with primary oncologist.  CIR recommended.  A & P  Spinal cord compression secondary to circumferential epidural extension of tumor at T6-T7, metastatic prostate cancer with diffuse osseous metastatic disease largest lesion T6  --continue XRT as per radiation oncology, steroids. --f/u with Dr. Tasia Catchings as outpatient --PT, OT recommending CIR. --Continue present management  Chronic pain secondary to malignancy -- Remains stable, continue MS Contin, breakthrough analgesia  Steroid-induced hyperglycemia --Insignificant.  Has not required insulin.  CBG checked stopped.  Essential hypertension -- Stable  Goals of care --Full scope  Disposition Plan:  Discussion:   Status is: Inpatient  Remains inpatient appropriate because:Inpatient level of care appropriate due to severity of illness  Dispo: The patient is from: Home              Anticipated d/c is to: CIR              Patient currently is not medically stable to d/c.   Difficult to place patient No  DVT prophylaxis: enoxaparin (LOVENOX) injection 40 mg Start: 12/11/20 1000   Code Status: Full Code Level of care: Med-Surg Family Communication: none  [name], MD  [facility] Direct contact: see [email or web address] (further directions at bottom of note if needed) 7PM-7AM contact night coverage as at bottom of note 12/15/2020, 3:09 PM  LOS: 4 days    Consults:  . Radiation oncology . Neurosurgery . Oncology     Procedures:  . XRT   Interval History/Subjective  CC: cancer  Feels okay today, no complaints.  Objective   Vitals:  Vitals:   12/15/20 0529 12/15/20 1246  BP: (!) 177/90 (!) 148/87  Pulse: (!) 50 (!) 46  Resp: 18 18  Temp: (!) 97.5 F (36.4 C) (!) 97.5 F (36.4 C)  SpO2: 100% 100%    Exam: Constitutional:   . Appears calm and comfortable ENMT:  . grossly normal hearing  Respiratory:  . CTA bilaterally, no w/r/r.  . Respiratory effort normal.  Cardiovascular:  . RRR, no m/r/g . No LE extremity edema   Psychiatric:  . Mental status o Mood, affect appropriate  I have personally reviewed the following:   Today's Data  . No new data  Scheduled Meds: . amLODipine  5 mg Oral Daily  . dexamethasone (DECADRON) injection  4 mg Intravenous Q6H  . enoxaparin (LOVENOX) injection  40 mg Subcutaneous Daily  . morphine  15 mg Oral Q12H  . polyethylene glycol  17 g Oral BID  . vitamin B-12  1,000 mcg Oral Daily   Continuous Infusions:  Principal Problem:   Cord compression [facility]) Active Problems:   Prostate cancer metastatic to bone (HCC)   Pain of metastatic malignancy   LOS: 4 days   How to contact the [facility] Attending or Consulting provider [place] or covering provider during after hours [employer], for this patient?  1. Check the care team in [facility] and look for a) attending/consulting [facility] provider listed and b) the [facility] team listed 2. Log into [email or web address] and use  [facility] universal password to access. If you do not have the password, please contact the hospital operator. 3. Locate the [facility] provider you are looking for under [facility] and page to a number that you can be directly reached. 4. If you still have difficulty reaching the provider, please page the [facility] (Director on Call) for the Hospitalists listed on amion for assistance.

## 2020-12-15 NOTE — PMR Pre-admission (Signed)
PMR Admission Coordinator Pre-Admission Assessment  Patient: Terry Mitchell is an 70 y.o., male MRN: [id number] DOB: 1951/01/05 Height: 6' (182.9 cm) Weight: 87.2 kg  Insurance Information HMO: Yes - Gold Plus    PPO:       PCP:       IPA:       80/20:       OTHER: Group [id number] PRIMARYJosephine Igo HMO      Policy#: [id number]      Subscriber: patient CM Name: Luster Landsberg      Phone#: [phone] ext [phone]     Fax#: [phone]  Received approval from Wyona Almas at [facility] on 12/15/20 for admission 5/27 for 5 days. Clinical updates due to Lurline Hare 5 days from admission.  Pre-Cert#: [id number]      Employer: Disabled Benefits:  Phone #: [phone]     Name: [email or web address] Eff. Date: 04/22/18     Deduct: $233      Out of Pocket Max: $3450      Life Max: N/A CIR: $2524 per admission      SNF: 100% Outpatient: 80%     Co-Pay: 20% Home Health: 100%      Co-Pay: none DME: 80%     Co-Pay: 20% Providers: in network  SECONDARY: Medicaid [place] access with coverage code [facility]      Policy#: [id number]     Phone#: [phone]  Financial Counselor:        Phone#:    The "Data Collection Information Summary" for patients in Inpatient Rehabilitation Facilities with attached "Privacy Act [place] Records" was provided and verbally reviewed with: Patient and Family  Emergency Contact Information Contact Information    Name Relation Home Work Mobile   Norwood,Catherine Daughter   [phone]      Current Medical History  Patient Admitting Diagnosis: Tumor T6-7 with cord compression  History of Present Illness: A 70 y.o. male with medical history significant of metastatic prostatic c10ancer and hypertension as well as depression presenting with increasing difficulty with ambulation.  Mr. Ord has a history of meastatic prostate cancer, previously following with Dr. Tasia Catchings.  He had docetaxel x6 and was unable to tolerate xtandi.  He declined additional  follow up with Dr. Tasia Catchings after his appointment in 05/06/2020.  He's been followed by hospice.  He notes he's had pain the past 3-4 months.  He's been managed for his pain by hospice.  He notes that morphine was recently added to his pain regiment last Friday.  Things went well for a few days, then he had difficulty getting up to the bathroom on Wednesday.  His legs felt weak.  Thursday and Friday he felt bloated.  Initially thought it was new pain medicine and stopped morphine (stopped on Thursday), but his symptoms persisted.  He noted progressive worsening with numbness of his abdomen, legs, stomach.  He eventually presented to the ED due to the progression of these symptoms.  He notes 2 weeks ago he was riding a motorcycle.  Last week he was taking trash to dumpster, can't do that now.  ED Course: imaging concerning for cord compression, discussed with neurosurgery, radiation oncology.  PT and OT evaluations were completed with recommendations for CIR.  Patient's medical record from [facility] has been reviewed by the rehabilitation admission coordinator and physician.  Past Medical History  Past Medical History:  Diagnosis Date  . Depression    recently went on disability d/t diagnosis  . History of recent  fall 01/2018   missed the last step on ladder, causing back discomfort  . Hypertension   . Prostate cancer ([place]) 12/2017   prostate    Family History   family history includes Diabetes in his maternal aunt; Kidney failure in his mother; Stroke in his mother.  Prior Rehab/Hospitalizations Has the patient had prior rehab or hospitalizations prior to admission? No  Has the patient had major surgery during 100 days prior to admission? No   Current Medications  Current Facility-Administered Medications:  .  acetaminophen (TYLENOL) tablet 650 mg, 650 mg, Oral, Q6H PRN, 650 mg at 12/15/20 0851 **OR** acetaminophen (TYLENOL) suppository 650 mg, 650 mg, Rectal, Q6H PRN,  Elodia Florence., MD .  amLODipine (NORVASC) tablet 5 mg, 5 mg, Oral, Daily, Samuella Cota, MD, 5 mg at 12/15/20 0841 .  dexamethasone (DECADRON) injection 4 mg, 4 mg, Intravenous, Q6H, Elodia Florence., MD, 4 mg at 12/15/20 0542 .  enoxaparin (LOVENOX) injection 40 mg, 40 mg, Subcutaneous, Daily, Elodia Florence., MD, 40 mg at 12/15/20 [phone] .  morphine (MS CONTIN) 12 hr tablet 15 mg, 15 mg, Oral, Q12H, Elodia Florence., MD, 15 mg at 12/15/20 0841 .  ondansetron (ZOFRAN) tablet 4 mg, 4 mg, Oral, Q6H PRN **OR** ondansetron (ZOFRAN) injection 4 mg, 4 mg, Intravenous, Q6H PRN, Elodia Florence., MD .  oxyCODONE (Oxy IR/ROXICODONE) immediate release tablet 5 mg, 5 mg, Oral, Q4H PRN, 5 mg at 12/14/20 0009 **OR** oxyCODONE (Oxy IR/ROXICODONE) immediate release tablet 10 mg, 10 mg, Oral, Q4H PRN, Elodia Florence., MD, 10 mg at 12/13/20 0440 .  polyethylene glycol (MIRALAX / GLYCOLAX) packet 17 g, 17 g, Oral, BID, Elodia Florence., MD, 17 g at 12/15/20 [phone] .  vitamin B-12 (CYANOCOBALAMIN) tablet 1,000 mcg, 1,000 mcg, Oral, Daily, Elodia Florence., MD, 1,000 mcg at 12/15/20 3976  Facility-Administered Medications Ordered in Other Encounters:  .  sodium chloride flush (NS) 0.9 % injection 10 mL, 10 mL, Intravenous, Once, [name], MD  Patients Current Diet:  Diet Order            Diet regular Room service appropriate? Yes; Fluid consistency: Thin  Diet effective now                 Precautions / Restrictions Precautions Precautions: Fall Restrictions Weight Bearing Restrictions: No   Has the patient had 2 or more falls or a fall with injury in the past year? No  Prior Activity Level Community (5-7x/wk): Went out most days up until 5/18 when LE weakness developed.  Prior Functional Level Self Care: Did the patient need help bathing, dressing, using the toilet or eating? Independent  Indoor Mobility: Did the patient need assistance with  walking from room to room (with or without device)? Independent  Stairs: Did the patient need assistance with internal or external stairs (with or without device)? Independent  Functional Cognition: Did the patient need help planning regular tasks such as shopping or remembering to take medications? Independent  Home Assistive Devices / Equipment Home Assistive Devices/Equipment: None Home Equipment: Cane - single point,Bedside commode  Prior Device Use: Indicate devices/aids used by the patient prior to current illness, exacerbation or injury? None of the above  Current Functional Level Cognition  Overall Cognitive Status: Within Functional Limits for tasks assessed Orientation Level: Oriented X4 General Comments: Pleasant and motivated    Extremity Assessment (includes Sensation/Coordination)  Upper Extremity Assessment: Overall WFL for tasks  assessed  Lower Extremity Assessment: Defer to PT evaluation RLE Deficits / Details: AROM grossly WFL, strength  3+/5 RLE Sensation: decreased proprioception,decreased light touch RLE Coordination: decreased gross motor LLE Deficits / Details: baseline muscle atrophy compared to RLE (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"); grossly 2+/5 LLE Sensation: decreased light touch,decreased proprioception LLE Coordination: decreased gross motor    ADLs  Overall ADL's : Needs assistance/impaired Eating/Feeding: Independent Grooming: Set up,Sitting,Wash/dry hands,Wash/dry face,Oral care Grooming Details (indicate cue type and reason): Pt asked to unlock his recliner. Pt then able to pull himself to sink and perform grooming at chair level. Upper Body Bathing: Set up,Sitting Lower Body Bathing: Minimal assistance,Sitting/lateral leans,Sit to/from stand Upper Body Dressing : Set up,Sitting Lower Body Dressing: Minimal assistance,Sitting/lateral leans,Bed level Toilet Transfer: Minimal assistance,BSC,RW,Stand-pivot Toilet Transfer Details (indicate cue  type and reason): Pt took lateral step around bed with RW and Min As with verbal cues for sequencing. Pt then took ~3 forward steps and with Min-Mod As, pivoted to recliner. Toileting- Clothing Manipulation and Hygiene: Minimal assistance,Sitting/lateral lean,Sit to/from stand Functional mobility during ADLs: Minimal assistance,Rolling walker    Mobility  Overal bed mobility: Needs Assistance Bed Mobility: Supine to Sit Supine to sit: Min guard,HOB elevated General bed mobility comments: in chair at arrival    Transfers  Overall transfer level: Needs assistance Equipment used: Rolling walker (2 wheeled) Transfers: Sit to/from Stand Sit to Stand: Min assist Stand pivot transfers: Min assist,Mod assist General transfer comment: Performed sit to stand x 5 with min A to steady and heavyuse of bil UE    Ambulation / Gait / Stairs / Wheelchair Mobility  Ambulation/Gait Ambulation/Gait assistance: Min assist,+2 safety/equipment Gait Distance (Feet): 40 Feet (40'x3) Assistive device: Rolling walker (2 wheeled) Gait Pattern/deviations: Step-to pattern,Decreased stride length,Decreased weight shift to left,Ataxic,Narrow base of support,Decreased dorsiflexion - left General Gait Details: Pt with ataxic like movements both legs, ambulates with step to L pattern and unsteady knees but no severe buckling, difficulty with weight shift to L and unable to advance to step throught pattern, L leg with increased ataxic movement and tends to adduct with tredelenberg hips leading to narrow BOS and scissoring at times, cued to focus on widening BOS and increasing R foot clearance for safety; had chair follow with seated rest breaks Gait velocity: decreased    Posture / Balance Balance Overall balance assessment: Needs assistance Sitting-balance support: No upper extremity supported,Feet supported Sitting balance-Leahy Scale: Good Standing balance support: Bilateral upper extremity supported Standing  balance-Leahy Scale: Poor Standing balance comment: requiring RW    Special needs/care consideration: None   Previous Home Environment (from acute therapy documentation) Living Arrangements: Spouse/significant other Available Help at Discharge: Family Type of Home: House ([facility] in [place]) Home Layout: Two level,Bed/bath upstairs Alternate Level Stairs-Rails: Right Alternate Level Stairs-Number of Steps: Government social research officer of Steps: 2 Home Care Services: Yes Type of Home Care Services: Hospice Additional Comments: wife has an LVAD,  pt also reports wife has some early dementia/STM deficits  Discharge Living Setting Plans for Discharge Living Setting: Apartment,Lives with (comment) Type of Home at Discharge: Apartment Discharge Home Layout: Two level,1/2 bath on main level,Bed/bath upstairs Alternate Level Stairs-Number of Steps: Flight Discharge Home Access: Stairs to enter Technical brewer of Steps: 2 Discharge Bathroom Shower/Tub: Tub/shower unit,Curtain Discharge Bathroom Toilet: Standard Discharge Bathroom Accessibility: Yes How Accessible: Accessible via wheelchair,Accessible via walker Does the patient have any problems obtaining your medications?: No  Social/Family/Support Systems Patient Roles: Spouse,Parent (Has 4 children and  significant other of many years.) Contact Information: Docia Furl - daughter - [phone] Anticipated Caregiver: Lourdes Sledge - significant other Ability/Limitations of Caregiver: Stanton Kidney has an LVAD Caregiver Availability: 24/7 Discharge Plan Discussed with Primary Caregiver: Yes Is Caregiver In Agreement with Plan?: Yes Does Caregiver/Family have Issues with Lodging/Transportation while Pt is in Rehab?: No  Goals Patient/Family Goal for Rehab: PT/OT supervision goals Expected length of stay: 10-14 days Cultural Considerations: None Pt/Family Agrees to Admission and willing to participate: Yes Program  Orientation Provided & Reviewed with Pt/Caregiver Including Roles  & Responsibilities: Yes  Decrease burden of Care through IP rehab admission: N/A  Possible need for SNF placement upon discharge: Not anticipated  Patient Condition: I have reviewed medical records from [facility], spoken with CM, and patient. I discussed via phone for inpatient rehabilitation assessment.  Patient will benefit from ongoing PT and OT, can actively participate in 3 hours of therapy a day 5 days of the week, and can make measurable gains during the admission.  Patient will also benefit from the coordinated team approach during an Inpatient Acute Rehabilitation admission.  The patient will receive intensive therapy as well as Rehabilitation physician, nursing, social worker, and care management interventions.  Due to bladder management, bowel management, safety, skin/wound care, disease management, medication administration, pain management and patient education the patient requires 24 hour a day rehabilitation nursing.  The patient is currently Minimum assist with mobility and basic ADLs.  Discharge setting and therapy post discharge at home with home health is anticipated.  Patient has agreed to participate in the Acute Inpatient Rehabilitation Program and will admit today.  Preadmission Screen Completed By:  Retta Diones, 12/15/2020 11:52 AM with updates by Clemens Catholic, MS CCC-SLP ______________________________________________________________________   Discussed status with Dr. Dagoberto Ligas on 12/20/20 at 930 and received approval for admission today.  Admission Coordinator:  Retta Diones, RN, with updates by Clemens Catholic MS CCC-SLP time 6578 /Date 12/20/20   Assessment/Plan: Diagnosis: 1. Does the need for close, 24 hr/day Medical supervision in concert with the patient's rehab needs make it unreasonable for this patient to be served in a less intensive setting? Yes 2. 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 Due to bladder management, bowel management, safety, skin/wound care, disease management, medication administration, pain management and patient education, does the patient require 24 hr/day rehab nursing? Yes 4. Does the patient require coordinated care of a physician, rehab nurse, PT, OT, and SLP to address physical and functional deficits in the context of the above medical diagnosis(es)? Yes Addressing deficits in the following areas: balance, endurance, locomotion, strength, transferring, bowel/bladder control, bathing, dressing, feeding, grooming and toileting 5. Can the patient actively participate in an intensive therapy program of at least 3 hrs of therapy 5 days a week? Yes 6. The potential for patient to make measurable gains while on inpatient rehab is good 7. Anticipated functional outcomes upon discharge from inpatient rehab: supervision PT, supervision OT, n/a SLP 8. Estimated rehab length of stay to reach the above functional goals is: 10-14 days 9. Anticipated discharge destination: Home 10. Overall Rehab/Functional Prognosis: good   MD Signature:

## 2020-12-15 NOTE — Progress Notes (Signed)
[place] visited pt. briefly while rounding on 3rd floor.  Pt. sitting in chair at bedside; grateful for visit, but expresses no needs at this time.  Pt. aware of chaplains' availability as needed.  Lindaann Pascal PRN Chaplain Pager: [phone]

## 2020-12-16 ENCOUNTER — Encounter: Payer: Self-pay | Admitting: Oncology

## 2020-12-16 ENCOUNTER — Ambulatory Visit
Admit: 2020-12-16 | Discharge: 2020-12-16 | Disposition: A | Discharge status: Home / Self Care | Payer: Medicare HMO | Attending: Radiation Oncology | Admitting: Radiation Oncology

## 2020-12-16 DIAGNOSIS — G9520 Unspecified cord compression: Secondary | ICD-10-CM | POA: Diagnosis not present

## 2020-12-16 DIAGNOSIS — C7951 Secondary malignant neoplasm of bone: Secondary | ICD-10-CM | POA: Diagnosis not present

## 2020-12-16 DIAGNOSIS — C61 Malignant neoplasm of prostate: Secondary | ICD-10-CM | POA: Diagnosis not present

## 2020-12-16 DIAGNOSIS — G893 Neoplasm related pain (acute) (chronic): Secondary | ICD-10-CM | POA: Diagnosis not present

## 2020-12-16 NOTE — TOC Progression Note (Signed)
Transition of Care [facility]) - Progression Note    Patient Details  Name: Terry Mitchell MRN: [id number] Date of Birth: [birth year]  Transition of Care [facility]) CM/SW Contact  Lennart Pall, [place] Phone Number: 12/16/2020, 2:44 PM  Clinical Narrative:    Alerted by CIR admissions that pt has insurance approval for CIR, however, bed not available to admit today.  May not have available bed until Mon/Tues.  MD, pt and daughter all aware.  TOC will continue to follow should any beds open prior to next week.   Expected Discharge Plan: [place] (vs. SNF) Barriers to Discharge: Continued Medical Work up  Expected Discharge Plan and Services Expected Discharge Plan: [place] (vs. SNF) In-house Referral: Clinical Social Work     Living arrangements for the past 2 months: Apartment                                       Social Determinants of Health (SDOH) Interventions    Readmission Risk Interventions No flowsheet data found.

## 2020-12-16 NOTE — Progress Notes (Signed)
Physical Therapy Treatment Patient Details Name: Terry Mitchell MRN: [id number] DOB: 22-Aug-1950 Today's Date: 12/16/2020    History of Present Illness Pt is 70 y.o. male with medical history significant of metastatic prostatic cancer and hypertension as well as depression presenting with increasing difficulty with ambulation on 12/11/20.   Imaging notable for circumferential epidural extension of tumor at T6-7 with compression of spinal cord (diffuse osseous metastatic disease with largest lesion at T6 -- L spine imaging with diffuse lumbar osseous metastatic disease without pathologic fracture -- MRI brain without acute intracranial on intracranial metastatic disease. NS consulted and recommended steroids/ radiaiton oncology consult    PT Comments    Pt OOB in recliner assisted with amb.  General transfer comment: heavy use B UE's with sit to stand and at time uncontrolled stand to sit due to fatigue.General Gait Details: VERY unsteady gait present with ataxia and R LE drag with excessive lean on walker but able to advance gait/steppage. Pt very motivated. Pt would benefit from aggressive Rehab at CIR to increase mobility.    Follow Up Recommendations  CIR     Equipment Recommendations  Rolling walker with 5" wheels    Recommendations for Other Services       Precautions / Restrictions Precautions Precautions: Fall Precaution Comments: METS cord compression Restrictions Weight Bearing Restrictions: No    Mobility  Bed Mobility               General bed mobility comments: OOB in recliner    Transfers Overall transfer level: Needs assistance Equipment used: Rolling walker (2 wheeled) Transfers: Sit to/from Stand Sit to Stand: Min assist Stand pivot transfers: Min assist;Mod assist       General transfer comment: heavy use B UE's with sit to stand and at time uncontrolled stand to sit due to fatigue.  Ambulation/Gait Ambulation/Gait assistance: Min assist;Mod  assist Gait Distance (Feet): 50 Feet (25 feet x 2 one seated rest break) Assistive device: Rolling walker (2 wheeled) Gait Pattern/deviations: Step-to pattern;Decreased stride length;Decreased weight shift to left;Ataxic;Narrow base of support;Decreased dorsiflexion - left;Scissoring Gait velocity: decreased   General Gait Details: VERY unsteady gait present with ataxia and R LE drag with excessive lean on walker but able to advance gait/steppage. Pt very motivated.   Stairs             Wheelchair Mobility    Modified Rankin (Stroke Patients Only)       Balance                                            Cognition Arousal/Alertness: Awake/alert Behavior During Therapy: WFL for tasks assessed/performed Overall Cognitive Status: Within Functional Limits for tasks assessed                                 General Comments: AxO x 3 very pleasant      Exercises      General Comments        Pertinent Vitals/Pain Pain Assessment: Faces Faces Pain Scale: Hurts a little bit Pain Location: B LE feels "numb" from groin down, stated pt Pain Descriptors / Indicators: Dull;Numbness    Home Living                      Prior Function  PT Goals (current goals can now be found in the care plan section) Progress towards PT goals: Progressing toward goals    Frequency    Min 3X/week      PT Plan Discharge plan needs to be updated    Co-evaluation              AM-PAC PT "6 Clicks" Mobility   Outcome Measure  Help needed turning from your back to your side while in a flat bed without using bedrails?: A Little Help needed moving from lying on your back to sitting on the side of a flat bed without using bedrails?: A Little Help needed moving to and from a bed to a chair (including a wheelchair)?: A Little Help needed standing up from a chair using your arms (e.g., wheelchair or bedside chair)?: A Little Help  needed to walk in hospital room?: A Little Help needed climbing 3-5 steps with a railing? : A Lot 6 Click Score: 17    End of Session Equipment Utilized During Treatment: Gait belt Activity Tolerance: Patient tolerated treatment well Patient left: in chair;with call bell/phone within reach Nurse Communication: Mobility status PT Visit Diagnosis: Other abnormalities of gait and mobility (R26.89);Muscle weakness (generalized) (M62.81)     Time: 5498-2641 PT Time Calculation (min) (ACUTE ONLY): 14 min  Charges:  $Gait Training: 8-22 mins                     Rica Koyanagi  PTA Acute  Rehabilitation Services Pager      [phone] Office      [phone]

## 2020-12-16 NOTE — Progress Notes (Addendum)
PROGRESS NOTE  Terry Mitchell [id number]:[id number] DOB: July 27, 1950 DOA: 12/11/2020 PCP: [name], MD  Brief History   70 year old man presented to [facility] PMH metastatic prostate cancer presented with increasing difficulty with ambulation.  Found to have cord compression secondary to metastatic prostate cancer.  Transferred to [facility] for urgent initiation of XRT, no role for neurosurgery at this time.  Seen by radiation oncology, started on XRT.  Seen by oncology with recommendation for outpatient follow-up with primary oncologist.  CIR recommended.  A & P  Spinal cord compression secondary to circumferential epidural extension of tumor at T6-T7, metastatic prostate cancer with diffuse osseous metastatic disease largest lesion T6  --continue XRT as per radiation oncology, steroids decreased per Dr. Ida Rogue rec. --f/u with Dr. Tasia Catchings as outpatient --PT, OT recommending CIR, await bed, pt medically stable --Continue present management  Chronic pain secondary to malignancy -- stable, continue MS Contin, breakthrough analgesia  Steroid-induced hyperglycemia --Insignificant.  Has not required insulin.  CBG checked stopped.  Essential hypertension -- Stable  Goals of care --Full scope  Disposition Plan:  Discussion:   Status is: Inpatient  Remains inpatient appropriate because:Inpatient level of care appropriate due to severity of illness  Dispo: The patient is from: Home              Anticipated d/c is to: CIR              Patient currently is not medically stable to d/c.   Difficult to place patient No  DVT prophylaxis: enoxaparin (LOVENOX) injection 40 mg Start: 12/11/20 1000   Code Status: Full Code Level of care: Med-Surg Family Communication: none  [name], MD  [facility] Direct contact: see [email or web address] (further directions at bottom of note if needed) 7PM-7AM contact night coverage as at bottom of note 12/16/2020, 2:16 PM  LOS: 5 days    Consults:   . Radiation oncology . Neurosurgery . Oncology    Procedures:  . XRT  Interval History/Subjective  CC: cancer  Feels ok, eating ok, breathing ok, worked with PT but was weaker today  Objective   Vitals:  Vitals:   12/15/20 2136 12/16/20 0505  BP: (!) 153/87 (!) 161/87  Pulse: 89 90  Resp: 18 16  Temp: 97.7 F (36.5 C) 98 F (36.7 C)  SpO2: 100% 99%    Exam: Constitutional:   . Appears calm and comfortable ENMT:  . grossly normal hearing  Respiratory:  . CTA bilaterally, no w/r/r.  . Respiratory effort normal.  Cardiovascular:  . RRR, no m/r/g Psychiatric:  . Mental status o Mood, affect appropriate  I have personally reviewed the following:   Today's Data  . No new data  Scheduled Meds: . amLODipine  5 mg Oral Daily  . dexamethasone  4 mg Oral TID  . enoxaparin (LOVENOX) injection  40 mg Subcutaneous Daily  . morphine  15 mg Oral Q12H  . polyethylene glycol  17 g Oral BID  . vitamin B-12  1,000 mcg Oral Daily   Continuous Infusions:  Principal Problem:   Cord compression [facility]) Active Problems:   Prostate cancer metastatic to bone (HCC)   Pain of metastatic malignancy   LOS: 5 days   How to contact the [facility] Attending or Consulting provider [place] or covering provider during after hours [place], for this patient?  1. Check the care team in [facility] and look for a) attending/consulting TRH provider listed and b) the [facility] team listed 2. Log into  [email or web address] and use [place]'s universal password to access. If you do not have the password, please contact the hospital operator. 3. Locate the [facility] provider you are looking for under [facility] and page to a number that you can be directly reached. 4. If you still have difficulty reaching the provider, please page the [facility] (Director on Call) for the Hospitalists listed on amion for assistance.

## 2020-12-17 DIAGNOSIS — G893 Neoplasm related pain (acute) (chronic): Secondary | ICD-10-CM | POA: Diagnosis not present

## 2020-12-17 DIAGNOSIS — G9520 Unspecified cord compression: Secondary | ICD-10-CM | POA: Diagnosis not present

## 2020-12-17 DIAGNOSIS — C7951 Secondary malignant neoplasm of bone: Secondary | ICD-10-CM | POA: Diagnosis not present

## 2020-12-17 DIAGNOSIS — C61 Malignant neoplasm of prostate: Secondary | ICD-10-CM | POA: Diagnosis not present

## 2020-12-17 NOTE — Progress Notes (Signed)
PROGRESS NOTE  Terry Mitchell [id number]:[id number] DOB: [birth year] DOA: 12/11/2020 PCP: [name], MD  Brief History   70 year old man presented to [facility] PMH metastatic prostate cancer presented with increasing difficulty with ambulation.  Found to have cord compression secondary to metastatic prostate cancer.  Transferred to [facility] for urgent initiation of XRT, no role for neurosurgery at this time.  Seen by radiation oncology, started on XRT.  Seen by oncology with recommendation for outpatient follow-up with primary oncologist.  CIR recommended.  A & P  Spinal cord compression secondary to circumferential epidural extension of tumor at T6-T7, metastatic prostate cancer with diffuse osseous metastatic disease largest lesion T6  --continue XRT as per radiation oncology, steroids decreased per Dr. Ida Rogue rec. --f/u with Dr. Tasia Catchings as outpatient --PT, OT recommending CIR, await bed, pt medically stable --Continue present management  Chronic pain secondary to malignancy -- stable, continue MS Contin, breakthrough analgesia  Steroid-induced hyperglycemia --Insignificant.  Has not required insulin.  CBG checked stopped.  Essential hypertension -- Stable  Goals of care --Full scope  No new issues  Disposition Plan:  Discussion: remains stable for transfer to CIR  Status is: Inpatient  Remains inpatient appropriate because:Inpatient level of care appropriate due to severity of illness  Dispo: The patient is from: Home              Anticipated d/c is to: CIR              Patient currently is not medically stable to d/c.   Difficult to place patient No  DVT prophylaxis: enoxaparin (LOVENOX) injection 40 mg Start: 12/11/20 1000   Code Status: Full Code Level of care: Med-Surg Family Communication: none  [name], MD  [facility] Direct contact: see [email or web address] (further directions at bottom of note if needed) 7PM-7AM contact night coverage as at bottom of  note 12/17/2020, 12:17 PM  LOS: 6 days    Consults:  . Radiation oncology . Neurosurgery . Oncology    Procedures:  . XRT  Interval History/Subjective  CC: cancer  Feels fine today, no complaints.  Objective   Vitals:  Vitals:   12/17/20 0609 12/17/20 0610  BP: (!) 182/98 (!) 177/98  Pulse: (!) 52 (!) 52  Resp: 14   Temp:    SpO2: 100% 100%    Exam: Constitutional:   . Appears calm and comfortable ENMT:  . grossly normal hearing  Respiratory:  . CTA bilaterally, no w/r/r.  . Respiratory effort normal.  Cardiovascular:  . RRR, no m/r/g Psychiatric:  . Mental status o Mood, affect appropriate  I have personally reviewed the following:   Today's Data  . No new data  Scheduled Meds: . amLODipine  5 mg Oral Daily  . dexamethasone  4 mg Oral TID  . enoxaparin (LOVENOX) injection  40 mg Subcutaneous Daily  . morphine  15 mg Oral Q12H  . polyethylene glycol  17 g Oral BID  . vitamin B-12  1,000 mcg Oral Daily   Continuous Infusions:  Principal Problem:   Cord compression [facility]) Active Problems:   Prostate cancer metastatic to bone (HCC)   Pain of metastatic malignancy   LOS: 6 days   How to contact the [facility] Attending or Consulting provider [place] or covering provider during after hours [place], for this patient?  1. Check the care team in [facility] and look for a) attending/consulting TRH provider listed and b) the [facility] team listed 2. Log into [email or web address] and use  [place]'s universal password to access. If you do not have the password, please contact the hospital operator. 3. Locate the [facility] provider you are looking for under [facility] and page to a number that you can be directly reached. 4. If you still have difficulty reaching the provider, please page the [facility] (Director on Call) for the Hospitalists listed on amion for assistance.

## 2020-12-18 DIAGNOSIS — C7951 Secondary malignant neoplasm of bone: Secondary | ICD-10-CM | POA: Diagnosis not present

## 2020-12-18 DIAGNOSIS — G9520 Unspecified cord compression: Secondary | ICD-10-CM | POA: Diagnosis not present

## 2020-12-18 DIAGNOSIS — G893 Neoplasm related pain (acute) (chronic): Secondary | ICD-10-CM | POA: Diagnosis not present

## 2020-12-18 DIAGNOSIS — C61 Malignant neoplasm of prostate: Secondary | ICD-10-CM | POA: Diagnosis not present

## 2020-12-18 LAB — CREATININE, SERUM
Creatinine, Ser: 1.31 mg/dL — ABNORMAL HIGH (ref 0.61–1.24)
GFR, Estimated: 59 mL/min — ABNORMAL LOW (ref >60)

## 2020-12-18 NOTE — Progress Notes (Signed)
Heard a commotion out in the hallway, saw pt sitting on the floor. Pt stated that he lost his balance and "went down" on his right hip/leg. Noted pt was with OT and gait belt was around his waist. Pt lifted up into his recliner, VSS. Denies any pain anywhere, no obvious sign of injury. MD notified, will cont to monitor.

## 2020-12-18 NOTE — Progress Notes (Signed)
Emptying pt's BSC and noted one sm brown stool (type 4) but noticed several spots of BRB on toilet paper. Questioned pt and he said he thought that maybe he had hemorrhoids. Examined pt and noted one small hemorroid but also one area at rectum that appeared to have fresh blood on it. Will notify MD and cont to monitor.

## 2020-12-18 NOTE — Progress Notes (Signed)
PROGRESS NOTE  Terry Mitchell [id number]:[id number] DOB: Mar 13, 1951 DOA: 12/11/2020 PCP: [name], MD  Brief History   70 year old man presented to [facility] PMH metastatic prostate cancer presented with increasing difficulty with ambulation.  Found to have cord compression secondary to metastatic prostate cancer.  Transferred to [facility] for urgent initiation of XRT, no role for neurosurgery at this time.  Seen by radiation oncology, started on XRT.  Seen by oncology with recommendation for outpatient follow-up with primary oncologist.  CIR recommended.  A & P  Spinal cord compression secondary to circumferential epidural extension of tumor at T6-T7, metastatic prostate cancer with diffuse osseous metastatic disease largest lesion T6  --continue XRT as per radiation oncology, steroids decreased per Dr. Ida Rogue rec. --f/u with Dr. Tasia Catchings as outpatient --PT, OT recommending CIR, await bed, pt medically stable --remains stable  Chronic pain secondary to malignancy -- remains stable, continue MS Contin, breakthrough analgesia  Steroid-induced hyperglycemia --Insignificant.  Has not required insulin.  CBG checked stopped.  Essential hypertension -- Stable  Goals of care --Full scope  Disposition Plan:  Discussion: remains stable for transfer to CIR  Status is: Inpatient  Remains inpatient appropriate because:Inpatient level of care appropriate due to severity of illness  Dispo: The patient is from: Home              Anticipated d/c is to: CIR              Patient currently is not medically stable to d/c.   Difficult to place patient No  DVT prophylaxis: enoxaparin (LOVENOX) injection 40 mg Start: 12/11/20 1000   Code Status: Full Code Level of care: Med-Surg Family Communication: none  [name], MD  [facility] Direct contact: see [email or web address] (further directions at bottom of note if needed) 7PM-7AM contact night coverage as at bottom of note 12/18/2020, 3:55 PM  LOS:  7 days    Consults:  . Radiation oncology . Neurosurgery . Oncology    Procedures:  . XRT  Interval History/Subjective  CC: cancer  Feels fine, no complaints.-That he fell through the floor earlier today as documented, he was assisted to the floor and did not strike.  He had some mild pain in his left lower leg lateral calf which has resolved.  No pain elsewhere.  Objective   Vitals:  Vitals:   12/18/20 1014 12/18/20 1302  BP: 134/84 140/83  Pulse: (!) 51 68  Resp: 18 17  Temp: 98.1 F (36.7 C) 97.8 F (36.6 C)  SpO2: 100% 100%    Exam: Constitutional:   . Appears calm and comfortable ENMT:  . grossly normal hearing  Respiratory:  . CTA bilaterally, no w/r/r.  . Respiratory effort normal.  Cardiovascular:  . RRR, no m/r/g . No LE extremity edema   Musculoskeletal:  . No pain w/ palpation right lower leg Psychiatric:  . Mental status o Mood, affect appropriate  I have personally reviewed the following:   Today's Data  . No new data  Scheduled Meds: . amLODipine  5 mg Oral Daily  . dexamethasone  4 mg Oral TID  . enoxaparin (LOVENOX) injection  40 mg Subcutaneous Daily  . morphine  15 mg Oral Q12H  . polyethylene glycol  17 g Oral BID  . vitamin B-12  1,000 mcg Oral Daily   Continuous Infusions:  Principal Problem:   Cord compression [facility]) Active Problems:   Prostate cancer metastatic to bone (HCC)   Pain of metastatic malignancy   LOS: 7  days   How to contact the [facility] Attending or Consulting provider  or covering provider during after hours [place], for this patient?  1. Check the care team in [facility] and look for a) attending/consulting TRH provider listed and b) the [facility] team listed 2. Log into [email or web address] and use [place]'s universal password to access. If you do not have the password, please contact the hospital operator. 3. Locate the [facility] provider you are looking for under [facility] and page to a number that you can be directly  reached. 4. If you still have difficulty reaching the provider, please page the [facility] (Director on Call) for the Hospitalists listed on amion for assistance.

## 2020-12-18 NOTE — Progress Notes (Signed)
Occupational Therapy Treatment Patient Details Name: Terry Mitchell MRN: [id number] DOB: 1950-09-09 Today's Date: 12/18/2020    History of present illness Pt is 70 y.o. male with medical history significant of metastatic prostatic cancer and hypertension as well as depression presenting with increasing difficulty with ambulation on 12/11/20.   Imaging notable for circumferential epidural extension of tumor at T6-7 with compression of spinal cord (diffuse osseous metastatic disease with largest lesion at T6 -- L spine imaging with diffuse lumbar osseous metastatic disease without pathologic fracture -- MRI brain without acute intracranial on intracranial metastatic disease. NS consulted and recommended steroids/ radiaiton oncology consult   OT comments  Upon arrival patient seated in recliner, very pleasant and agreeable to therapy. Patient reports alreadying performing grooming, bathing and dressing this AM. Agreeable to functional ambulation with walker, patient initially min A ambulating with walker due to lower extremity numbness and having to almost drag R LE at times/difficulty clearing R foot. Unfortunately after approximately 31ft ambulation while making small turn just outside patient's room door patient placed increased upper extremity weight onto R side of the walker causing it to tip and patient lose his balance. OT assist with lowering to floor with gait belt. Patient needing x3 assist to get back into recliner chair. Nursing arrived immediately after fall to assess for any injury, patient did not strike his head and reported very minimal soreness to R lateral leg. OT secure chat MD and performed safety zone. Patient highly motivated wanting to try again however encourage patient to rest/recover and can re-attempt ambulation at later time with nursing or therapy staff as able. Also educate patient to use call light when getting out of chair to have staff present, verbalize understanding.     Follow Up Recommendations  CIR    Equipment Recommendations  Other (comment) (defer to next venue)       Precautions / Restrictions Precautions Precautions: Fall Precaution Comments: METS cord compression, fell with OT 5/29 Restrictions Weight Bearing Restrictions: No       Mobility Bed Mobility               General bed mobility comments: in recliner    Transfers Overall transfer level: Needs assistance Equipment used: Rolling walker (2 wheeled) Transfers: Sit to/from Stand Sit to Stand: Min assist         General transfer comment: min A to power up to standing from recliner chair, please see general ADL comments for further detail    Balance Overall balance assessment: Needs assistance;History of Falls Sitting-balance support: Feet supported Sitting balance-Leahy Scale: Good     Standing balance support: Bilateral upper extremity supported Standing balance-Leahy Scale: Poor Standing balance comment: reliant on RW and external assist                           ADL either performed or assessed with clinical judgement   ADL Overall ADL's : Needs assistance/impaired                         Toilet Transfer: Minimal assistance;Cueing for safety;RW Toilet Transfer Details (indicate cue type and reason): patient min A to power up to standing from recliner chair with rolling walker         Functional mobility during ADLs: Minimal assistance;Rolling walker General ADL Comments: patient reports alreadying performing grooming, bathing and dressing this AM. Agreeable to functional ambulation with walker, patient initially min A ambulating  with walker due to numbness and having to almost drag R LE at times. Unfortunately while making small turn just outside patient's room door patient placed increased weight onto R side of the walker causing it to tip and patient lose his balance. OT assist with lowering to floor with gait belt. Patient needing  x3 assist to get back into recliner chair.               Cognition Arousal/Alertness: Awake/alert Behavior During Therapy: WFL for tasks assessed/performed Overall Cognitive Status: Within Functional Limits for tasks assessed                                 General Comments: AxO x 3 very pleasant                   Pertinent Vitals/ Pain       Pain Assessment: Faces Faces Pain Scale: Hurts a little bit Pain Location: R lateral leg post fall Pain Descriptors / Indicators: Sore Pain Intervention(s): Monitored during session;Other (comment) (nursing present to perform assessment post fall)         Frequency  Min 2X/week        Progress Toward Goals  OT Goals(current goals can now be found in the care plan section)  Progress towards OT goals: Progressing toward goals  Acute Rehab OT Goals Patient Stated Goal: get home OT Goal Formulation: With patient Time For Goal Achievement: 12/28/20 Potential to Achieve Goals: Good ADL Goals Pt Will Perform Grooming: standing;with supervision Pt Will Perform Lower Body Dressing: with supervision;sitting/lateral leans;sit to/from stand Pt Will Transfer to Toilet: with supervision;bedside commode;stand pivot transfer;ambulating Pt Will Perform Toileting - Clothing Manipulation and hygiene: with modified independence;with adaptive equipment;sitting/lateral leans;sit to/from stand  Plan Discharge plan remains appropriate       AM-PAC OT "6 Clicks" Daily Activity     Outcome Measure   Help from another person eating meals?: None Help from another person taking care of personal grooming?: A Little Help from another person toileting, which includes using toliet, bedpan, or urinal?: A Little Help from another person bathing (including washing, rinsing, drying)?: A Little Help from another person to put on and taking off regular upper body clothing?: A Little Help from another person to put on and taking off regular  lower body clothing?: A Little 6 Click Score: 19    End of Session Equipment Utilized During Treatment: Gait belt;Rolling walker  OT Visit Diagnosis: Ataxia, unspecified (R27.0);Other abnormalities of gait and mobility (R26.89);Unsteadiness on feet (R26.81)   Activity Tolerance Treatment limited secondary to medical complications (Comment) (fall)   Patient Left in chair;with call bell/phone within reach;Other (comment) (spoke with patient will need to call to use [facility] now, verbalize understanding.)   Nurse Communication Mobility status;Other (comment) (nursing arrived immediately post fall)        Time: 7517-0017 OT Time Calculation (min): 16 min  Charges: OT General Charges $OT Visit: 1 Visit OT Treatments $Self Care/Home Management : 8-22 mins  Delbert Phenix OT OT pager: [place] 12/18/2020, 10:40 AM

## 2020-12-19 DIAGNOSIS — G893 Neoplasm related pain (acute) (chronic): Secondary | ICD-10-CM | POA: Diagnosis not present

## 2020-12-19 DIAGNOSIS — C61 Malignant neoplasm of prostate: Secondary | ICD-10-CM | POA: Diagnosis not present

## 2020-12-19 DIAGNOSIS — C7951 Secondary malignant neoplasm of bone: Secondary | ICD-10-CM | POA: Diagnosis not present

## 2020-12-19 DIAGNOSIS — G9520 Unspecified cord compression: Secondary | ICD-10-CM | POA: Diagnosis not present

## 2020-12-19 NOTE — Progress Notes (Signed)
Inpatient Rehab Admissions Coordinator:   I do not have a bed on CIR for this Pt. Today. I updated Pt. And went over his insurance information with  Him. I am hopeful we will have a bed for this Pt. Tomorrow, but cannot guarantee a bed at this time.   Clemens Catholic, [place], [place] Admissions Coordinator  [phone] ([place]) [phone] (office)

## 2020-12-19 NOTE — Progress Notes (Signed)
Visited pt. today per RN suggestion that support may be helpful in light of changes to pt.'s diagnosis and prognosis this admission.  Pt. lying in bed when [place] entered, awake and friendly, but sharing no needs at this time.  CH remains available as needed.  Lindaann Pascal Chaplain Pager: [phone]

## 2020-12-19 NOTE — Care Management Important Message (Signed)
Medicare IM given to the patient by Kaina Orengo. 

## 2020-12-19 NOTE — Progress Notes (Signed)
Physical Therapy Treatment Patient Details Name: Terry Mitchell MRN: [id number] DOB: 14-Dec-1950 Today's Date: 12/19/2020    History of Present Illness Pt is 70 y.o. male with medical history significant of metastatic prostatic cancer and hypertension as well as depression presenting with increasing difficulty with ambulation on 12/11/20.   Imaging notable for circumferential epidural extension of tumor at T6-7 with compression of spinal cord (diffuse osseous metastatic disease with largest lesion at T6 -- L spine imaging with diffuse lumbar osseous metastatic disease without pathologic fracture -- MRI brain without acute intracranial on intracranial metastatic disease. NS consulted and recommended steroids/ radiation oncology consult    PT Comments    Pt limited today d/t incr LE weakness and fatigue. Has had 2 falls since yesterday. Pt remains very motivated and willing to work with PT. Would recommend +2 assist when pt up with nursing staff for safety.  Continue to follow in acute setting.    Follow Up Recommendations  CIR     Equipment Recommendations  Wheelchair (measurements PT);Rolling walker with 5" wheels    Recommendations for Other Services       Precautions / Restrictions Precautions Precautions: Fall Precaution Comments: METS cord compression, fell with OT 5/29 Restrictions Weight Bearing Restrictions: No    Mobility  Bed Mobility Overal bed mobility: Needs Assistance Bed Mobility: Sit to Supine       Sit to supine: Min guard   General bed mobility comments: for safety (on BSC on arrival)    Transfers Overall transfer level: Needs assistance Equipment used: Rolling walker (2 wheeled) Transfers: Sit to/from Stand Sit to Stand: Min assist;Mod assist Stand pivot transfers: Min assist;Mod assist       General transfer comment: cues for hand placment, sequence and to control descent. Pt requiring assist to balance and steady on transition to RW; requring  assist to prevent L knee buckling and balance on stand-step pivot transfer, heavily reliant on UEs  Ambulation/Gait                 Stairs             Wheelchair Mobility    Modified Rankin (Stroke Patients Only)       Balance     Sitting balance-Leahy Scale: Good     Standing balance support: Bilateral upper extremity supported Standing balance-Leahy Scale: Poor Standing balance comment: heavily reliant on UEs and external assist                            Cognition Arousal/Alertness: Awake/alert Behavior During Therapy: WFL for tasks assessed/performed Overall Cognitive Status: Within Functional Limits for tasks assessed                                 General Comments: AxO x 3 very pleasant      Exercises General Exercises - Lower Extremity Ankle Circles/Pumps: AROM;Both;15 reps;Seated    General Comments        Pertinent Vitals/Pain Pain Assessment: No/denies pain    Home Living                      Prior Function            PT Goals (current goals can now be found in the care plan section) Acute Rehab PT Goals Patient Stated Goal: get home PT Goal Formulation: With patient Time For Goal  Achievement: 12/26/20 Potential to Achieve Goals: Good Progress towards PT goals: Progressing toward goals (slowly)    Frequency    Min 3X/week      PT Plan Discharge plan needs to be updated    Co-evaluation              AM-PAC PT "6 Clicks" Mobility   Outcome Measure  Help needed turning from your back to your side while in a flat bed without using bedrails?: A Little Help needed moving from lying on your back to sitting on the side of a flat bed without using bedrails?: A Little Help needed moving to and from a bed to a chair (including a wheelchair)?: A Little Help needed standing up from a chair using your arms (e.g., wheelchair or bedside chair)?: A Lot Help needed to walk in hospital room?:  Total Help needed climbing 3-5 steps with a railing? : Total 6 Click Score: 13    End of Session Equipment Utilized During Treatment: Gait belt Activity Tolerance: Patient tolerated treatment well Patient left: in bed;with call bell/phone within reach;with bed alarm set Nurse Communication: Mobility status PT Visit Diagnosis: Other abnormalities of gait and mobility (R26.89);Muscle weakness (generalized) (M62.81)     Time: 0350-0938 PT Time Calculation (min) (ACUTE ONLY): 22 min  Charges:  $Gait Training: 8-22 mins                     Baxter Flattery, PT  Acute Rehab Dept ([place]) [phone] Pager [phone]  12/19/2020    [facility] 12/19/2020, 1:32 PM

## 2020-12-19 NOTE — Progress Notes (Signed)
PROGRESS NOTE  Terry Mitchell SHF:[id number] DOB: 1950-10-07 DOA: 12/11/2020 PCP: [name], MD  Brief History   70 year old man presented to [facility] PMH metastatic prostate cancer presented with increasing difficulty with ambulation.  Found to have cord compression secondary to metastatic prostate cancer.  Transferred to [facility] for urgent initiation of XRT, no role for neurosurgery at this time.  Seen by radiation oncology, started on XRT.  Seen by oncology with recommendation for outpatient follow-up with primary oncologist.  CIR recommended.  A & P  Spinal cord compression secondary to circumferential epidural extension of tumor at T6-T7, metastatic prostate cancer with diffuse osseous metastatic disease largest lesion T6  --continue XRT as per radiation oncology, steroids decreased per Dr. Ida Rogue rec. --f/u with Dr. Tasia Catchings as outpatient --PT, OT recommending CIR, await bed, pt medically stable -- A few falls during this hospitalization.  Chronic pain secondary to malignancy -- Stable.  Continue MS Contin, breakthrough analgesia  Essential hypertension -- remains stable  Goals of care --Full scope  Disposition Plan:  Discussion: stable for transfer to CIR  Status is: Inpatient  Remains inpatient appropriate because:Inpatient level of care appropriate due to severity of illness  Dispo: The patient is from: Home              Anticipated d/c is to: CIR              Patient currently is not medically stable to d/c.   Difficult to place patient No  DVT prophylaxis: enoxaparin (LOVENOX) injection 40 mg Start: 12/11/20 1000   Code Status: Full Code Level of care: Med-Surg Family Communication: none  [name], MD  [facility] Direct contact: see [email or web address] (further directions at bottom of note if needed) 7PM-7AM contact night coverage as at bottom of note 12/19/2020, 3:16 PM  LOS: 8 days    Consults:  . Radiation oncology . Neurosurgery . Oncology     Procedures:  . XRT  Interval History/Subjective  CC: cancer  Feels fine, no complaints.-That he fell through the floor earlier today as documented, he was assisted to the floor and did not strike.  He had some mild pain in his left lower leg lateral calf which has resolved.  No pain elsewhere.  Objective   Vitals:  Vitals:   12/19/20 0551 12/19/20 1329  BP: (!) 174/89 121/75  Pulse: (!) 54 (!) 54  Resp: 18 18  Temp: 98 F (36.7 C) 98.4 F (36.9 C)  SpO2: 100% 100%    Exam: Constitutional:   . Appears calm and comfortable ENMT:  . grossly normal hearing  Respiratory:  . CTA bilaterally, no w/r/r.  . Respiratory effort normal.  Cardiovascular:  . RRR, no m/r/g . No LE extremity edema   Musculoskeletal:  . No pain w/ palpation right lower leg Psychiatric:  . Mental status o Mood, affect appropriate  I have personally reviewed the following:   Today's Data  . No new data  Scheduled Meds: . amLODipine  5 mg Oral Daily  . dexamethasone  4 mg Oral TID  . enoxaparin (LOVENOX) injection  40 mg Subcutaneous Daily  . morphine  15 mg Oral Q12H  . polyethylene glycol  17 g Oral BID  . vitamin B-12  1,000 mcg Oral Daily   Continuous Infusions:  Principal Problem:   Cord compression [facility]) Active Problems:   Prostate cancer metastatic to bone (HCC)   Pain of metastatic malignancy   LOS: 8 days   How to contact  the [facility] Attending or Consulting provider [place] or covering provider during after hours [place], for this patient?  1. Check the care team in [facility] and look for a) attending/consulting [facility] provider listed and b) the [facility] team listed 2. Log into [email or web address] and use [place]'s universal password to access. If you do not have the password, please contact the hospital operator. 3. Locate the [facility] provider you are looking for under [facility] and page to a number that you can be directly reached. 4. If you still have difficulty reaching the provider,  please page the [facility] (Director on Call) for the Hospitalists listed on amion for assistance.

## 2020-12-20 ENCOUNTER — Ambulatory Visit
Admit: 2020-12-20 | Discharge: 2020-12-20 | Disposition: A | Discharge status: Home / Self Care | Payer: Medicare HMO | Attending: Radiation Oncology | Admitting: Radiation Oncology

## 2020-12-20 ENCOUNTER — Encounter (HOSPITAL_COMMUNITY): Payer: Self-pay | Admitting: Physical Medicine and Rehabilitation

## 2020-12-20 ENCOUNTER — Other Ambulatory Visit: Payer: Self-pay

## 2020-12-20 ENCOUNTER — Inpatient Hospital Stay (HOSPITAL_COMMUNITY)
Admission: RE | Admit: 2020-12-20 | Discharge: 2021-01-12 | DRG: 052 | Disposition: A | Discharge status: Home / Self Care | Payer: Medicare HMO | Source: Other Acute Inpatient Hospital | Attending: Physical Medicine and Rehabilitation | Admitting: Physical Medicine and Rehabilitation

## 2020-12-20 DIAGNOSIS — D696 Thrombocytopenia, unspecified: Secondary | ICD-10-CM | POA: Diagnosis present

## 2020-12-20 DIAGNOSIS — Z923 Personal history of irradiation: Secondary | ICD-10-CM

## 2020-12-20 DIAGNOSIS — N1832 Chronic kidney disease, stage 3b: Secondary | ICD-10-CM | POA: Diagnosis present

## 2020-12-20 DIAGNOSIS — K649 Unspecified hemorrhoids: Secondary | ICD-10-CM | POA: Diagnosis present

## 2020-12-20 DIAGNOSIS — G893 Neoplasm related pain (acute) (chronic): Secondary | ICD-10-CM | POA: Diagnosis not present

## 2020-12-20 DIAGNOSIS — E875 Hyperkalemia: Secondary | ICD-10-CM | POA: Diagnosis present

## 2020-12-20 DIAGNOSIS — Z79899 Other long term (current) drug therapy: Secondary | ICD-10-CM | POA: Diagnosis not present

## 2020-12-20 DIAGNOSIS — F32A Depression, unspecified: Secondary | ICD-10-CM | POA: Diagnosis present

## 2020-12-20 DIAGNOSIS — Z510 Encounter for antineoplastic radiation therapy: Secondary | ICD-10-CM | POA: Diagnosis present

## 2020-12-20 DIAGNOSIS — J432 Centrilobular emphysema: Secondary | ICD-10-CM | POA: Diagnosis present

## 2020-12-20 DIAGNOSIS — G8222 Paraplegia, incomplete: Secondary | ICD-10-CM | POA: Diagnosis not present

## 2020-12-20 DIAGNOSIS — G9520 Unspecified cord compression: Secondary | ICD-10-CM

## 2020-12-20 DIAGNOSIS — C61 Malignant neoplasm of prostate: Secondary | ICD-10-CM | POA: Diagnosis present

## 2020-12-20 DIAGNOSIS — Z87891 Personal history of nicotine dependence: Secondary | ICD-10-CM

## 2020-12-20 DIAGNOSIS — C7949 Secondary malignant neoplasm of other parts of nervous system: Secondary | ICD-10-CM | POA: Diagnosis present

## 2020-12-20 DIAGNOSIS — I10 Essential (primary) hypertension: Secondary | ICD-10-CM

## 2020-12-20 DIAGNOSIS — M21372 Foot drop, left foot: Secondary | ICD-10-CM | POA: Diagnosis present

## 2020-12-20 DIAGNOSIS — Z880 Allergy status to penicillin: Secondary | ICD-10-CM

## 2020-12-20 DIAGNOSIS — C7951 Secondary malignant neoplasm of bone: Secondary | ICD-10-CM | POA: Diagnosis present

## 2020-12-20 DIAGNOSIS — G8220 Paraplegia, unspecified: Secondary | ICD-10-CM | POA: Diagnosis present

## 2020-12-20 DIAGNOSIS — G992 Myelopathy in diseases classified elsewhere: Secondary | ICD-10-CM | POA: Diagnosis present

## 2020-12-20 DIAGNOSIS — I129 Hypertensive chronic kidney disease with stage 1 through stage 4 chronic kidney disease, or unspecified chronic kidney disease: Secondary | ICD-10-CM | POA: Diagnosis present

## 2020-12-20 DIAGNOSIS — D649 Anemia, unspecified: Secondary | ICD-10-CM

## 2020-12-20 DIAGNOSIS — N179 Acute kidney failure, unspecified: Secondary | ICD-10-CM | POA: Diagnosis not present

## 2020-12-20 DIAGNOSIS — K5900 Constipation, unspecified: Secondary | ICD-10-CM | POA: Diagnosis present

## 2020-12-20 DIAGNOSIS — S9031XA Contusion of right foot, initial encounter: Secondary | ICD-10-CM | POA: Diagnosis present

## 2020-12-20 DIAGNOSIS — Z79891 Long term (current) use of opiate analgesic: Secondary | ICD-10-CM

## 2020-12-20 DIAGNOSIS — T465X5A Adverse effect of other antihypertensive drugs, initial encounter: Secondary | ICD-10-CM | POA: Diagnosis not present

## 2020-12-20 DIAGNOSIS — R296 Repeated falls: Secondary | ICD-10-CM | POA: Diagnosis present

## 2020-12-20 DIAGNOSIS — Z91011 Allergy to milk products: Secondary | ICD-10-CM

## 2020-12-20 MED ORDER — POLYETHYLENE GLYCOL 3350 17 G PO PACK
17.0000 g | PACK | Freq: Two times a day (BID) | ORAL | Status: DC
  Administered 2020-12-21 – 2021-01-05 (×6): 17 g via ORAL
  Filled 2020-12-20 (×26): qty 1

## 2020-12-20 MED ORDER — DIPHENHYDRAMINE HCL 12.5 MG/5ML PO ELIX: 12.5000 mg | ORAL_SOLUTION | Freq: Four times a day (QID) | ORAL | Status: DC | PRN

## 2020-12-20 MED ORDER — DEXAMETHASONE 4 MG PO TABS
4.0000 mg | ORAL_TABLET | Freq: Three times a day (TID) | ORAL | Status: DC
  Administered 2020-12-20 – 2020-12-26 (×17): 4 mg via ORAL
  Filled 2020-12-20 (×17): qty 1

## 2020-12-20 MED ORDER — ENOXAPARIN SODIUM 40 MG/0.4ML IJ SOSY: 40.0000 mg | PREFILLED_SYRINGE | INTRAMUSCULAR | Status: DC

## 2020-12-20 MED ORDER — AMLODIPINE BESYLATE 5 MG PO TABS
5.0000 mg | ORAL_TABLET | Freq: Every day | ORAL | Status: DC
  Administered 2020-12-21 – 2021-01-03 (×14): 5 mg via ORAL
  Filled 2020-12-20 (×14): qty 1

## 2020-12-20 MED ORDER — OXYCODONE HCL 5 MG PO TABS
5.0000 mg | ORAL_TABLET | ORAL | Status: DC | PRN
  Administered 2021-01-06 – 2021-01-10 (×3): 5 mg via ORAL
  Filled 2020-12-20 (×2): qty 1

## 2020-12-20 MED ORDER — OXYCODONE HCL 10 MG PO TABS: 10.0000 mg | ORAL_TABLET | ORAL | Status: DC | PRN

## 2020-12-20 MED ORDER — ONDANSETRON HCL 4 MG/2ML IJ SOLN: 4.0000 mg | Freq: Four times a day (QID) | INTRAMUSCULAR | Status: DC | PRN

## 2020-12-20 MED ORDER — VITAMIN B-12 1000 MCG PO TABS
1000.0000 ug | ORAL_TABLET | Freq: Every day | ORAL | Status: DC
  Administered 2020-12-21 – 2021-01-12 (×23): 1000 ug via ORAL
  Filled 2020-12-20 (×24): qty 1

## 2020-12-20 MED ORDER — MORPHINE SULFATE ER 15 MG PO TBCR
15.0000 mg | EXTENDED_RELEASE_TABLET | Freq: Two times a day (BID) | ORAL | Status: DC
  Administered 2020-12-20 – 2020-12-31 (×22): 15 mg via ORAL
  Filled 2020-12-20 (×22): qty 1

## 2020-12-20 MED ORDER — BISACODYL 10 MG RE SUPP: 10.0000 mg | Freq: Every day | RECTAL | Status: DC | PRN

## 2020-12-20 MED ORDER — ALUM & MAG HYDROXIDE-SIMETH 200-200-20 MG/5ML PO SUSP: 30.0000 mL | ORAL | Status: DC | PRN

## 2020-12-20 MED ORDER — POLYETHYLENE GLYCOL 3350 17 G PO PACK: 17.0000 g | PACK | Freq: Two times a day (BID) | ORAL | 0 refills | Status: DC

## 2020-12-20 MED ORDER — POLYETHYLENE GLYCOL 3350 17 G PO PACK: 17.0000 g | PACK | Freq: Every day | ORAL | Status: DC | PRN

## 2020-12-20 MED ORDER — ONDANSETRON HCL 4 MG PO TABS: 4.0000 mg | ORAL_TABLET | Freq: Four times a day (QID) | ORAL | Status: DC | PRN

## 2020-12-20 MED ORDER — ENOXAPARIN SODIUM 40 MG/0.4ML IJ SOSY
40.0000 mg | PREFILLED_SYRINGE | Freq: Every day | INTRAMUSCULAR | Status: DC
  Administered 2020-12-21 – 2021-01-12 (×23): 40 mg via SUBCUTANEOUS
  Filled 2020-12-20 (×23): qty 0.4

## 2020-12-20 MED ORDER — OXYCODONE HCL 5 MG PO TABS
10.0000 mg | ORAL_TABLET | ORAL | Status: DC | PRN
  Administered 2021-01-04 – 2021-01-12 (×8): 10 mg via ORAL
  Filled 2020-12-20 (×9): qty 2

## 2020-12-20 MED ORDER — OXYCODONE HCL 5 MG PO TABS: 5.0000 mg | ORAL_TABLET | ORAL | Status: DC | PRN

## 2020-12-20 MED ORDER — ACETAMINOPHEN 325 MG PO TABS: 650.0000 mg | ORAL_TABLET | Freq: Four times a day (QID) | ORAL | Status: DC | PRN

## 2020-12-20 MED ORDER — ACETAMINOPHEN 325 MG PO TABS
325.0000 mg | ORAL_TABLET | ORAL | Status: DC | PRN
  Administered 2021-01-07 – 2021-01-12 (×4): 650 mg via ORAL
  Filled 2020-12-20 (×5): qty 2

## 2020-12-20 MED ORDER — FLEET ENEMA 7-19 GM/118ML RE ENEM: 1.0000 | ENEMA | Freq: Once | RECTAL | Status: DC | PRN

## 2020-12-20 MED ORDER — TRAZODONE HCL 50 MG PO TABS: 25.0000 mg | ORAL_TABLET | Freq: Every evening | ORAL | Status: DC | PRN

## 2020-12-20 MED ORDER — GUAIFENESIN-DM 100-10 MG/5ML PO SYRP: 5.0000 mL | ORAL_SOLUTION | Freq: Four times a day (QID) | ORAL | Status: DC | PRN

## 2020-12-20 MED ORDER — DEXAMETHASONE 4 MG PO TABS: 4.0000 mg | ORAL_TABLET | Freq: Three times a day (TID) | ORAL | Status: DC

## 2020-12-20 MED ORDER — DEXAMETHASONE 4 MG PO TABS: ORAL_TABLET | ORAL | Status: DC

## 2020-12-20 NOTE — H&P (Signed)
Physical Medicine and Rehabilitation Admission H&P    CC: Myelopathy due to metastatic prostate cancer.    HPI: Terry Mitchell is a 70 year old male with history of depression, HTN, prostate cancer off treatment/followed by hospice;  who was admitted on 12/11/20 with reports of back pain for 3-4 months with BLE weakness and started developing progressive numbness from abdomen down with inability to walk. He was found to have circumferential tumor at T7/T7 with compression of spinal cord and diffuse osseous metastatic disease without pathologic Fx and largest lesion at T12. Dr. Cari Caraway recommended high dose steroids as well as hem/onc/radiation input. He was evaluated by Dr. Lisbeth Renshaw and started on palliative XRT immediately.  Dr. Irene Limbo consulted for input --> CT abdomen/pelvis/chest done for staging and showed enlargening bilateral rib lesions, No PE and abdomen/pelvis without significant abnormality.   Patient revoked  hospice services and  elected on full scope of care with palliative systemic therapies as well as therapy to regain prior functional status. . Steroids to be slowly weaned off and he is to follow up with Dr. Tasia Catchings on outpatient basis.   Therapy ongoing and patient continues to be limited by BLE weakness, multiple falls as well as balance deficits. CIR was consulted for progressive therapies.    Pt reports voiding well- R side of his body has been stronger since he was a child- just been worse since lumbar surgery.  Also notes has hemorrhoids- they are irritated due to pushing- has control of stool-LBM in last 24 hours.   Review of Systems  Constitutional: Negative for chills and fever.  HENT: Negative for hearing loss.   Eyes: Negative for blurred vision and double vision.  Respiratory: Negative for cough and shortness of breath.   Cardiovascular: Negative for chest pain, palpitations and leg swelling.  Gastrointestinal: Negative for constipation, heartburn and nausea.   Genitourinary: Negative for dysuria.  Musculoskeletal: Positive for myalgias.  Skin: Negative for rash.  Neurological: Positive for sensory change, focal weakness and weakness. Negative for dizziness and headaches.  Psychiatric/Behavioral: The patient is not nervous/anxious and does not have insomnia.   All other systems reviewed and are negative.   Past Medical History:  Diagnosis Date  . Depression    recently went on disability d/t diagnosis  . History of recent fall 01/2018   missed the last step on ladder, causing back discomfort  . Hypertension   . Prostate cancer ([place]) 12/2017   prostate    Past Surgical History:  Procedure Laterality Date  . BACK SURGERY  2001    2 rods and 6 screws in back  . COLONOSCOPY WITH PROPOFOL N/A 09/24/2019   Procedure: COLONOSCOPY WITH PROPOFOL;  Surgeon: Jonathon Bellows, MD;  Location: [facility] ENDOSCOPY;  Service: Gastroenterology;  Laterality: N/A;  . Left shoulder surgery Left 1998   removed a piece of bone around collar bone  . PROSTATE BIOPSY N/A 02/28/2018   Procedure: PROSTATE BIOPSY;  Surgeon: Abbie Sons, MD;  Location: [facility] ORS;  Service: Urology;  Laterality: N/A;  . TRANSRECTAL ULTRASOUND N/A 02/28/2018   Procedure: TRANSRECTAL ULTRASOUND;  Surgeon: Abbie Sons, MD;  Location: [facility] ORS;  Service: Urology;  Laterality: N/A;    Family History  Problem Relation Age of Onset  . Stroke Mother   . Kidney failure Mother   . Diabetes Maternal Aunt     Social History:  Lives with fiancee (LVAD patient). He reports that he quit smoking about 16 months ago. His smoking use  included cigars. He smoked 2.00 packs per day. He has never used smokeless tobacco. He reports current alcohol use of about 9.0 standard drinks of alcohol per week. He reports prior use of  Drugs: Cocaine and "Crack" cocaine.   Allergies  Allergen Reactions  . Penicillins Hives and Other (See Comments)    Hives, welts, swelling profusely . Pretty severe reaction Has  patient had a PCN reaction causing immediate rash, facial/tongue/throat swelling, SOB or lightheadedness with hypotension: No Has patient had a PCN reaction causing severe rash involving mucus membranes or skin necrosis: Yes Has patient had a PCN reaction that required hospitalization: Yes Has patient had a PCN reaction occurring within the last 10 years: No If all of the above answers are "NO", then may proceed with Cephalosporin use.   . Lactose Intolerance (Gi) Other (See Comments)    Gi upset    Medications Prior to Admission  Medication Sig Dispense Refill  . albuterol (VENTOLIN HFA) 108 (90 Base) MCG/ACT inhaler Inhale 2 puffs into the lungs every 6 (six) hours as needed for wheezing or shortness of breath. 6.7 g 0  . amLODipine (NORVASC) 5 MG tablet Take 1 tablet (5 mg total) by mouth daily. 30 tablet 0  . atorvastatin (LIPITOR) 40 MG tablet Take 40 mg by mouth daily.    Marland Kitchen loratadine (CLARITIN) 10 MG tablet Take 10 mg by mouth daily. In prep for fulphila    . losartan (COZAAR) 50 MG tablet Take 50 mg by mouth daily after breakfast.   2  . morphine (MS CONTIN) 15 MG 12 hr tablet Take 1 tablet (15 mg total) by mouth every 12 (twelve) hours. 60 tablet 0  . ondansetron (ZOFRAN) 8 MG tablet Take 1 tablet (8 mg total) by mouth every 8 (eight) hours as needed for nausea or vomiting. (Patient not taking: No sig reported) 45 tablet 2  . oxyCODONE-acetaminophen (PERCOCET) 5-325 MG tablet Take 1-2 tablets by mouth every 4 (four) hours as needed for severe pain. 90 tablet 0  . vitamin B-12 (CYANOCOBALAMIN) 1000 MCG tablet Take 1 tablet (1,000 mcg total) by mouth daily. 90 tablet 1    Drug Regimen Review  Drug regimen was reviewed and remains appropriate with no significant issues identified  Home: Home Living Family/patient expects to be discharged to:: Inpatient rehab Living Arrangements: Spouse/significant other Available Help at Discharge: Family Type of Home: House ([facility] [place]) Technical brewer of Steps: 2 Home Layout: Two level,Bed/bath upstairs Alternate Level Stairs-Number of Steps: flight Alternate Level Stairs-Rails: Right Home Equipment: Cane - single point,Bedside commode Additional Comments: wife has an LVAD,  pt also reports wife has some early dementia/STM deficits   Functional History: Prior Function Level of Independence: Independent  Functional Status:  Mobility: Bed Mobility Overal bed mobility: Needs Assistance Bed Mobility: Sit to Supine Supine to sit: Min guard,HOB elevated Sit to supine: Min guard General bed mobility comments: for safety (on BSC on arrival) Transfers Overall transfer level: Needs assistance Equipment used: Rolling walker (2 wheeled) Transfers: Sit to/from Stand Sit to Stand: Min assist,Mod assist Stand pivot transfers: Min assist,Mod assist General transfer comment: cues for hand placment, sequence and to control descent. Pt requiring assist to balance and steady on transition to RW; requring assist to prevent L knee buckling and balance on stand-step pivot transfer, heavily reliant on UEs Ambulation/Gait Ambulation/Gait assistance: Min assist,Mod assist Gait Distance (Feet): 50 Feet (25 feet x 2 one seated rest break) Assistive device: Rolling walker (2 wheeled) Gait Pattern/deviations:  Step-to pattern,Decreased stride length,Decreased weight shift to left,Ataxic,Narrow base of support,Decreased dorsiflexion - left,Scissoring General Gait Details: VERY unsteady gait present with ataxia and R LE drag with excessive lean on walker but able to advance gait/steppage. Pt very motivated. Gait velocity: decreased    ADL: ADL Overall ADL's : Needs assistance/impaired Eating/Feeding: Independent Grooming: Set up,Sitting,Wash/dry hands,Wash/dry face,Oral care Grooming Details (indicate cue type and reason): Pt asked to unlock his recliner. Pt then able to pull himself to sink and perform grooming at chair  level. Upper Body Bathing: Set up,Sitting Lower Body Bathing: Minimal assistance,Sitting/lateral leans,Sit to/from stand Upper Body Dressing : Set up,Sitting Lower Body Dressing: Minimal assistance,Sitting/lateral leans,Bed level Toilet Transfer: Minimal assistance,Cueing for safety,RW Toilet Transfer Details (indicate cue type and reason): patient min A to power up to standing from recliner chair with rolling walker Toileting- Clothing Manipulation and Hygiene: Minimal assistance,Sitting/lateral lean,Sit to/from stand Functional mobility during ADLs: Minimal assistance,Rolling walker General ADL Comments: patient reports alreadying performing grooming, bathing and dressing this AM. Agreeable to functional ambulation with walker, patient initially min A ambulating with walker due to numbness and having to almost drag R LE at times. Unfortunately while making small turn just outside patient's room door patient placed increased weight onto R side of the walker causing it to tip and patient lose his balance. OT assist with lowering to floor with gait belt. Patient needing x3 assist to get back into recliner chair.  Cognition: Cognition Overall Cognitive Status: Within Functional Limits for tasks assessed Orientation Level: Oriented X4 Cognition Arousal/Alertness: Awake/alert Behavior During Therapy: WFL for tasks assessed/performed Overall Cognitive Status: Within Functional Limits for tasks assessed General Comments: AxO x 3 very pleasant   Blood pressure (!) 152/72, pulse 69, temperature 98.5 F (36.9 C), temperature source Oral, resp. rate 16, height 6' (1.829 m), weight 87.2 kg, SpO2 100 %. Physical Exam Vitals and nursing note reviewed. Exam conducted with a chaperone present.  Constitutional:      Appearance: Normal appearance.     Comments: Pt sitting up in bed, appears younger than stated age; appropriate, awake, NAD; Charge nurse at bedside as well as nurse coordinator  HENT:      Head: Normocephalic and atraumatic.     Comments: Smile equal Partial dentures    Right Ear: External ear normal.     Left Ear: External ear normal.     Nose: Nose normal. No congestion.     Mouth/Throat:     Mouth: Mucous membranes are moist.     Pharynx: Oropharynx is clear. No oropharyngeal exudate.  Eyes:     General:        Right eye: No discharge.        Left eye: No discharge.     Extraocular Movements: Extraocular movements intact.  Cardiovascular:     Rate and Rhythm: Normal rate and regular rhythm.     Heart sounds: Normal heart sounds. No murmur heard. No gallop.   Pulmonary:     Comments: CTA B/L- no W/R/R- good air movement Abdominal:     Comments: Soft, NT, ND, (+)BS - normoactive  Musculoskeletal:        General: No swelling.     Cervical back: Normal range of motion and neck supple.     Comments: Pt's strength in UEs 5/5 B/L in biceps, triceps, WE, grip and finger abd B/L LEs: RLE- HF 4-/5, KE 4-/5, DF 4+/5, PF 4-/5 LLE: HF 3/5, KE 4-/5, DF 3+/5, and PF 4-/5  Skin:  General: Skin is warm and dry.     Comments: Has IV L hand- looks OK Skin looks great except has radiation mark as well as fungal toenails/very thickened   Neurological:     Mental Status: He is alert and oriented to person, place, and time.     Sensory: Sensory deficit present.     Motor: Weakness present.     Comments: Speech clear. Sensory deficits from waist down with paraplegia.  Sensation decreased a lot from T7 on down to S5 B/L   Psychiatric:        Mood and Affect: Mood normal.        Behavior: Behavior normal.     No results found for this or any previous visit (from the past 48 hour(s)). No results found.     Medical Problem List and Plan: 1.  T6 paraplegia ASIA D secondary to mets to Spinal cord/tumors compressing [place]/prostate CA  -patient may  shower  -ELOS/Goals:10-14 days- goals supervision 2.  Antithrombotics: -DVT/anticoagulation:  Pharmaceutical: Lovenox  -will  need for at least 2 months due to [place], no matter how far he's walking  -antiplatelet therapy: N/a 3. Pain Management: MS contin bid with oxycodone prn. con't and change as required 4. Mood: LCSW to follow for evaluation and support.   -antipsychotic agents: N/a 5. Neuropsych: This patient is capable of making decisions on his own behalf. 6. Skin/Wound Care: Routine pressure relief measures.  7. Fluids/Electrolytes/Nutrition: Monitor I/O. Check lytes in am.  8. HTN: Monitor BP tid--continue Norvasc.   --Resume Cozaar if BP continues to be labile   9. Metastatic prostate cancer to spine: XRT ongoing  --slow steroid taper--4 mg tid X 3 days followed by bid X 7 days-->daily X 7 days, then 2 mg daily X 7d days.   10. At risk for spasticity and neurogenic bowel and bladder- will scan bladder to make sure emptying and monitor bowels.    Bary Leriche, PA-C 12/20/2020    I have personally performed a face to face diagnostic evaluation of this patient and formulated the key components of the plan.  Additionally, I have personally reviewed laboratory data, imaging studies, as well as relevant notes and concur with the physician assistant's documentation above.

## 2020-12-20 NOTE — Progress Notes (Signed)
Inpatient Rehabilitation Medication Review by a Pharmacist  A complete drug regimen review was completed for this patient to identify any potential clinically significant medication issues.  Clinically significant medication issues were identified:  no  Check AMION for pharmacist assigned to patient if future medication questions/issues arise during this admission.  Pharmacist comments:   Time spent performing this drug regimen review (minutes):  46min   Vernecia Umble S. Alford Highland, PharmD, BCPS Clinical Staff Pharmacist [email or web address]  Wayland Salinas 12/20/2020 2:48 PM

## 2020-12-20 NOTE — Progress Notes (Signed)
PMR Admission Coordinator Pre-Admission Assessment  Patient: Terry Mitchell is an 70 y.o., male MRN: [id number] DOB: Dec 07, 1950 Height: 6' (182.9 cm) Weight: 87.2 kg  Insurance Information HMO: Yes - Gold Plus    PPO:       PCP:       IPA:       80/20:       OTHER: Group [id number] PRIMARYJosephine Mitchell HMO      Policy#: [id number]      Subscriber: patient CM Name: Terry Mitchell      Phone#: [phone] ext [phone]     Fax#: [phone]  Received approval from Terry Mitchell at [facility] on 12/15/20 for admission 5/27 for 5 days. Clinical updates due to Terry Mitchell 5 days from admission.  Pre-Cert#: [id number]      Employer: Disabled Benefits:  Phone #: [phone]     Name: [email or web address] Eff. Date: 04/22/18     Deduct: $233      Out of Pocket Max: $3450      Life Max: N/A CIR: $2524 per admission      SNF: 100% Outpatient: 80%     Co-Pay: 20% Home Health: 100%      Co-Pay: none DME: 80%     Co-Pay: 20% Providers: in network  SECONDARY: Medicaid [place] access with coverage code [facility]      Policy#: [id number]     Phone#: [phone]  Financial Counselor:        Phone#:    The "Data Collection Information Summary" for patients in Inpatient Rehabilitation Facilities with attached "Privacy Act [place] Records" was provided and verbally reviewed with: Patient and Family  Emergency Contact Information         Contact Information    Name Relation Home Work Mobile   Terry Mitchell Daughter   [phone]      Current Medical History  Patient Admitting Diagnosis: Tumor T6-7 with cord compression  History of Present Illness: A 70 y.o.malewith medical history significant ofmetastatic prostatic c10ancer and hypertension as well as depression presenting with increasing difficulty with ambulation.  [name] has a history of meastatic prostate cancer, previously following with Dr. Tasia Catchings. He had docetaxel x6 and was unable to tolerate xtandi. He  declined additional follow up with Dr. Tasia Catchings after his appointment in 05/06/2020. He's been followed by hospice. He notes he's had pain the past 3-4 months. He's been managed for his pain by hospice. He notes that morphine was recently added to his pain regiment last Friday. Things went well for a few days, then he had difficulty getting up to the bathroom on Wednesday. His legs felt weak. Thursday and Friday he felt bloated. Initially thought it was new pain medicine and stopped morphine (stopped on Thursday), but his symptoms persisted. He noted progressive worsening with numbness of his abdomen, legs, stomach. He eventually presented to the ED due to the progression of these symptoms. He notes 2 weeks ago he was riding a motorcycle. Last week he was taking trash to dumpster, can't do that now. ED Course:imaging concerning for cord compression, discussed with neurosurgery, radiation oncology.  PT and OT evaluations were completed with recommendations for CIR.  Patient's [facility] from [facility] has been reviewed by the rehabilitation admission coordinator and physician.  Past Medical History      Past Medical History:  Diagnosis Date  . Depression    recently went on disability d/t diagnosis  . History of recent fall 01/2018   missed the last  step on ladder, causing back discomfort  . Hypertension   . Prostate cancer ([place]) 12/2017   prostate    Family History   family history includes Diabetes in his maternal aunt; Kidney failure in his mother; Stroke in his mother.  Prior Rehab/Hospitalizations Has the patient had prior rehab or hospitalizations prior to admission? No  Has the patient had major surgery during 100 days prior to admission? No              Current Medications  Current Facility-Administered Medications:  .  acetaminophen (TYLENOL) tablet 650 mg, 650 mg, Oral, Q6H PRN, 650 mg at 12/15/20 0851 **OR** acetaminophen (TYLENOL)  suppository 650 mg, 650 mg, Rectal, Q6H PRN, Elodia Florence., MD .  amLODipine (NORVASC) tablet 5 mg, 5 mg, Oral, Daily, Samuella Cota, MD, 5 mg at 12/15/20 0841 .  dexamethasone (DECADRON) injection 4 mg, 4 mg, Intravenous, Q6H, Elodia Florence., MD, 4 mg at 12/15/20 0542 .  enoxaparin (LOVENOX) injection 40 mg, 40 mg, Subcutaneous, Daily, Elodia Florence., MD, 40 mg at 12/15/20 [phone] .  morphine (MS CONTIN) 12 hr tablet 15 mg, 15 mg, Oral, Q12H, Elodia Florence., MD, 15 mg at 12/15/20 0841 .  ondansetron (ZOFRAN) tablet 4 mg, 4 mg, Oral, Q6H PRN **OR** ondansetron (ZOFRAN) injection 4 mg, 4 mg, Intravenous, Q6H PRN, Elodia Florence., MD .  oxyCODONE (Oxy IR/ROXICODONE) immediate release tablet 5 mg, 5 mg, Oral, Q4H PRN, 5 mg at 12/14/20 0009 **OR** oxyCODONE (Oxy IR/ROXICODONE) immediate release tablet 10 mg, 10 mg, Oral, Q4H PRN, Elodia Florence., MD, 10 mg at 12/13/20 0440 .  polyethylene glycol (MIRALAX / GLYCOLAX) packet 17 g, 17 g, Oral, BID, Elodia Florence., MD, 17 g at 12/15/20 [phone] .  vitamin B-12 (CYANOCOBALAMIN) tablet 1,000 mcg, 1,000 mcg, Oral, Daily, Elodia Florence., MD, 1,000 mcg at 12/15/20 7062  Facility-Administered Medications Ordered in Other Encounters:  .  sodium chloride flush (NS) 0.9 % injection 10 mL, 10 mL, Intravenous, Once, [name], MD  Patients Current Diet:     Diet Order                  Diet regular Room service appropriate? Yes; Fluid consistency: Thin  Diet effective now                  Precautions / Restrictions Precautions Precautions: Fall Restrictions Weight Bearing Restrictions: No   Has the patient had 2 or more falls or a fall with injury in the past year? No  Prior Activity Level Community (5-7x/wk): Went out most days up until 5/18 when LE weakness developed.  Prior Functional Level Self Care: Did the patient need help bathing, dressing, using the toilet or eating?  Independent  Indoor Mobility: Did the patient need assistance with walking from room to room (with or without device)? Independent  Stairs: Did the patient need assistance with internal or external stairs (with or without device)? Independent  Functional Cognition: Did the patient need help planning regular tasks such as shopping or remembering to take medications? Independent  Home Assistive Devices / Equipment Home Assistive Devices/Equipment: None Home Equipment: Cane - single point,Bedside commode  Prior Device Use: Indicate devices/aids used by the patient prior to current illness, exacerbation or injury? None of the above  Current Functional Level Cognition  Overall Cognitive Status: Within Functional Limits for tasks assessed Orientation Level: Oriented X4 General Comments: Pleasant and motivated  Extremity Assessment (includes Sensation/Coordination)  Upper Extremity Assessment: Overall WFL for tasks assessed  Lower Extremity Assessment: Defer to PT evaluation RLE Deficits / Details: AROM grossly WFL, strength  3+/5 RLE Sensation: decreased proprioception,decreased light touch RLE Coordination: decreased gross motor LLE Deficits / Details: baseline muscle atrophy compared to RLE (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"); grossly 2+/5 LLE Sensation: decreased light touch,decreased proprioception LLE Coordination: decreased gross motor    ADLs  Overall ADL's : Needs assistance/impaired Eating/Feeding: Independent Grooming: Set up,Sitting,Wash/dry hands,Wash/dry face,Oral care Grooming Details (indicate cue type and reason): Pt asked to unlock his recliner. Pt then able to pull himself to sink and perform grooming at chair level. Upper Body Bathing: Set up,Sitting Lower Body Bathing: Minimal assistance,Sitting/lateral leans,Sit to/from stand Upper Body Dressing : Set up,Sitting Lower Body Dressing: Minimal assistance,Sitting/lateral leans,Bed level Toilet  Transfer: Minimal assistance,BSC,RW,Stand-pivot Toilet Transfer Details (indicate cue type and reason): Pt took lateral step around bed with RW and Min As with verbal cues for sequencing. Pt then took ~3 forward steps and with Min-Mod As, pivoted to recliner. Toileting- Clothing Manipulation and Hygiene: Minimal assistance,Sitting/lateral lean,Sit to/from stand Functional mobility during ADLs: Minimal assistance,Rolling walker    Mobility  Overal bed mobility: Needs Assistance Bed Mobility: Supine to Sit Supine to sit: Min guard,HOB elevated General bed mobility comments: in chair at arrival    Transfers  Overall transfer level: Needs assistance Equipment used: Rolling walker (2 wheeled) Transfers: Sit to/from Stand Sit to Stand: Min assist Stand pivot transfers: Min assist,Mod assist General transfer comment: Performed sit to stand x 5 with min A to steady and heavyuse of bil UE    Ambulation / Gait / Stairs / Wheelchair Mobility  Ambulation/Gait Ambulation/Gait assistance: Min assist,+2 safety/equipment Gait Distance (Feet): 40 Feet (40'x3) Assistive device: Rolling walker (2 wheeled) Gait Pattern/deviations: Step-to pattern,Decreased stride length,Decreased weight shift to left,Ataxic,Narrow base of support,Decreased dorsiflexion - left General Gait Details: Pt with ataxic like movements both legs, ambulates with step to L pattern and unsteady knees but no severe buckling, difficulty with weight shift to L and unable to advance to step throught pattern, L leg with increased ataxic movement and tends to adduct with tredelenberg hips leading to narrow BOS and scissoring at times, cued to focus on widening BOS and increasing R foot clearance for safety; had chair follow with seated rest breaks Gait velocity: decreased    Posture / Balance Balance Overall balance assessment: Needs assistance Sitting-balance support: No upper extremity supported,Feet supported Sitting  balance-Leahy Scale: Good Standing balance support: Bilateral upper extremity supported Standing balance-Leahy Scale: Poor Standing balance comment: requiring RW    Special needs/care consideration: None   Previous Home Environment (from acute therapy documentation) Living Arrangements: Spouse/significant other Available Help at Discharge: Family Type of Home: House ([facility] in [place]) Home Layout: Two level,Bed/bath upstairs Alternate Level Stairs-Rails: Right Alternate Level Stairs-Number of Steps: Government social research officer of Steps: 2 Home Care Services: Yes Type of Home Care Services: Hospice Additional Comments: wife has an LVAD,  pt also reports wife has some early dementia/STM deficits  Discharge Living Setting Plans for Discharge Living Setting: Apartment,Lives with (comment) Type of Home at Discharge: Apartment Discharge Home Layout: Two level,1/2 bath on main level,Bed/bath upstairs Alternate Level Stairs-Number of Steps: Flight Discharge Home Access: Stairs to enter Technical brewer of Steps: 2 Discharge Bathroom Shower/Tub: Tub/shower unit,Curtain Discharge Bathroom Toilet: Standard Discharge Bathroom Accessibility: Yes How Accessible: Accessible via wheelchair,Accessible via walker Does the patient have any problems obtaining your  medications?: No  Social/Family/Support Systems Patient Roles: Spouse,Parent (Has 4 children and significant other of many years.) Contact Information: Docia Furl - daughter - [phone] Anticipated Caregiver: Lourdes Sledge - significant other Ability/Limitations of Caregiver: Stanton Kidney has an LVAD Caregiver Availability: 24/7 Discharge Plan Discussed with Primary Caregiver: Yes Is Caregiver In Agreement with Plan?: Yes Does Caregiver/Family have Issues with Lodging/Transportation while Pt is in Rehab?: No  Goals Patient/Family Goal for Rehab: PT/OT supervision goals Expected length of stay: 10-14  days Cultural Considerations: None Pt/Family Agrees to Admission and willing to participate: Yes Program Orientation Provided & Reviewed with Pt/Caregiver Including Roles  & Responsibilities: Yes  Decrease burden of Care through IP rehab admission: N/A  Possible need for SNF placement upon discharge: Not anticipated  Patient Condition: I have reviewed medical records from [facility], spoken with CM, and patient. I discussed via phone for inpatient rehabilitation assessment.  Patient will benefit from ongoing PT and OT, can actively participate in 3 hours of therapy a day 5 days of the week, and can make measurable gains during the admission.  Patient will also benefit from the coordinated team approach during an Inpatient Acute Rehabilitation admission.  The patient will receive intensive therapy as well as Rehabilitation physician, nursing, social worker, and care management interventions.  Due to bladder management, bowel management, safety, skin/wound care, disease management, medication administration, pain management and patient education the patient requires 24 hour a day rehabilitation nursing.  The patient is currently Minimum assist with mobility and basic ADLs.  Discharge setting and therapy post discharge at home with home health is anticipated.  Patient has agreed to participate in the Acute Inpatient Rehabilitation Program and will admit today.  Preadmission Screen Completed By:  Retta Diones, 12/15/2020 11:52 AM with updates by Clemens Catholic, MS CCC-SLP ______________________________________________________________________   Discussed status with Dr. Dagoberto Ligas on 12/20/20 at 930 and received approval for admission today.  Admission Coordinator:  Retta Diones, RN, with updates by Clemens Catholic MS CCC-SLP time 3748 /Date 12/20/20   Assessment/Plan: Diagnosis: 1. Does the need for close, 24 hr/day Medical supervision in concert with the patient's rehab needs make  it unreasonable for this patient to be served in a less intensive setting? Yes 2. 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 Due to bladder management, bowel management, safety, skin/wound care, disease management, medication administration, pain management and patient education, does the patient require 24 hr/day rehab nursing? Yes 4. Does the patient require coordinated care of a physician, rehab nurse, PT, OT, and SLP to address physical and functional deficits in the context of the above medical diagnosis(es)? Yes Addressing deficits in the following areas: balance, endurance, locomotion, strength, transferring, bowel/bladder control, bathing, dressing, feeding, grooming and toileting 5. Can the patient actively participate in an intensive therapy program of at least 3 hrs of therapy 5 days a week? Yes 6. The potential for patient to make measurable gains while on inpatient rehab is good 7. Anticipated functional outcomes upon discharge from inpatient rehab: supervision PT, supervision OT, n/a SLP 8. Estimated rehab length of stay to reach the above functional goals is: 10-14 days 9. Anticipated discharge destination: Home 10. Overall Rehab/Functional Prognosis: good

## 2020-12-20 NOTE — Progress Notes (Addendum)
Inpatient Rehab Admissions Coordinator:    I have a bed on CIR for this pt. RN may call report to [phone].   Clemens Catholic, [place], [place] Admissions Coordinator  [phone] (celll) [phone] (office) .

## 2020-12-20 NOTE — Discharge Summary (Addendum)
Physician Discharge Summary  Terry Mitchell QVZ:[id number] DOB: 08-20-1950 DOA: 12/11/2020  PCP: [name], MD  Admit date: 12/11/2020 Discharge date: 12/20/2020  Recommendations for Outpatient Follow-up:   Spinal cord compression secondary to circumferential epidural extension of tumor at T6-T7, metastatic prostate cancer with diffuse osseous metastatic disease largest lesion T6  --continue XRT as per radiation oncology, steroids.  Need to taper steroids weekly as per Dr. Lisbeth Renshaw. --f/u with Dr. Tasia Catchings as outpatient   Follow-up Information    [name], MD. Schedule an appointment as soon as possible for a visit in 2 week(s).   Specialty: Oncology Contact information: [place] [address] [phone]               Discharge Diagnoses: Principal diagnosis is #1 Principal Problem:   Cord compression [facility]) Active Problems:   Prostate cancer metastatic to bone [facility])   Pain of metastatic malignancy   Discharge Condition: improved Disposition: CIR  Diet recommendation:  Diet Orders (From admission, onward)    Start     Ordered   12/20/20 0000  Diet - low sodium heart healthy        12/20/20 1205   12/11/20 0719  Diet regular Room service appropriate? Yes; Fluid consistency: Thin  Diet effective now       Question Answer Comment  Room service appropriate? Yes   Fluid consistency: Thin      12/11/20 0718           Filed Weights   12/13/20 0500 12/15/20 0500  Weight: 90.1 kg 87.2 kg    HPI/Hospital Course:   70 year old man presented to [facility] PMH metastatic prostate cancer presented with increasing difficulty with ambulation.  Found to have cord compression secondary to metastatic prostate cancer.  Transferred to [facility] for urgent initiation of XRT, no role for neurosurgery at this time.  Seen by radiation oncology, started on XRT.  Seen by oncology with recommendation for outpatient follow-up with primary oncologist.  CIR recommended.   Hospitalization prolonged by insurance authorization and bed and availability.  Spinal cord compression secondary to circumferential epidural extension of tumor at T6-T7, metastatic prostate cancer with diffuse osseous metastatic disease largest lesion T6  --continue XRT as per radiation oncology, steroids.  Need to taper steroids weekly as per Dr. Lisbeth Renshaw. --f/u with Dr. Tasia Catchings as outpatient --PT, OT recommended CIR  -- A few falls during this hospitalization.  Chronic pain secondary to malignancy -- Stable.  Continue MS Contin, breakthrough analgesia  Essential hypertension -- remains stable  Goals of care --Full scope  Consults:   Radiation oncology  Neurosurgery  Oncology   Procedures:   XRT  Today's assessment: S: CC: f/u cord compression  Feeling ok today, eating fine.  O: Vitals:  Vitals:   12/19/20 2120 12/20/20 0514  BP: (!) 143/83 (!) 152/72  Pulse: (!) 54 69  Resp: 16 16  Temp: 98.3 F (36.8 C) 98.5 F (36.9 C)  SpO2: 100% 100%    Constitutional:  . Appears calm and comfortable ENMT:  . grossly normal hearing  Respiratory:  . CTA bilaterally, no w/r/r.  . Respiratory effort normal. Cardiovascular:  . RRR, no m/r/g . No LE extremity edema   Musculoskeletal:  . RLE, LLE   . Moves both legs to command Psychiatric:  . Mental status o Mood, affect appropriate  No new data  Discharge Instructions  Discharge Instructions    Diet - low sodium heart healthy   Complete by: As  directed    Discharge instructions   Complete by: As directed    Call your physician or seek immediate medical attention for weakness, pain, numbness or worsening of condition.     Allergies as of 12/20/2020      Reactions   Penicillins Hives, Other (See Comments)   Hives, welts, swelling profusely . Pretty severe reaction Has patient had a PCN reaction causing immediate rash, facial/tongue/throat swelling, SOB or lightheadedness with hypotension: No Has patient had a  PCN reaction causing severe rash involving mucus membranes or skin necrosis: Yes Has patient had a PCN reaction that required hospitalization: Yes Has patient had a PCN reaction occurring within the last 10 years: No If all of the above answers are "NO", then may proceed with Cephalosporin use.   Lactose Intolerance (gi) Other (See Comments)   Gi upset      Medication List    STOP taking these medications   oxyCODONE-acetaminophen 5-325 MG tablet Commonly known as: Percocet     TAKE these medications   acetaminophen 325 MG tablet Commonly known as: TYLENOL Take 2 tablets (650 mg total) by mouth every 6 (six) hours as needed for mild pain (or Fever >/= 101).   albuterol 108 (90 Base) MCG/ACT inhaler Commonly known as: VENTOLIN HFA Inhale 2 puffs into the lungs every 6 (six) hours as needed for wheezing or shortness of breath.   amLODipine 5 MG tablet Commonly known as: NORVASC Take 1 tablet (5 mg total) by mouth daily.   atorvastatin 40 MG tablet Commonly known as: LIPITOR Take 40 mg by mouth daily.   dexamethasone 4 MG tablet Commonly known as: DECADRON Take 1 tablet (4 mg total) by mouth 3 (three) times daily for 2 days, THEN 1 tablet (4 mg total) 2 (two) times daily for 7 days, THEN 1 tablet (4 mg total) daily for 7 days, THEN 0.5 tablets (2 mg total) daily for 7 days. Start taking on: Dec 20, 2020   loratadine 10 MG tablet Commonly known as: CLARITIN Take 10 mg by mouth daily. In prep for fulphila   losartan 50 MG tablet Commonly known as: COZAAR Take 50 mg by mouth daily after breakfast.   morphine 15 MG 12 hr tablet Commonly known as: MS CONTIN Take 1 tablet (15 mg total) by mouth every 12 (twelve) hours.   ondansetron 8 MG tablet Commonly known as: ZOFRAN Take 1 tablet (8 mg total) by mouth every 8 (eight) hours as needed for nausea or vomiting.   oxyCODONE 5 MG immediate release tablet Commonly known as: Oxy IR/ROXICODONE Take 1 tablet (5 mg total) by  mouth every 4 (four) hours as needed for moderate pain.   Oxycodone HCl 10 MG Tabs Take 1 tablet (10 mg total) by mouth every 4 (four) hours as needed for severe pain.   polyethylene glycol 17 g packet Commonly known as: MIRALAX / GLYCOLAX Take 17 g by mouth 2 (two) times daily.   vitamin B-12 1000 MCG tablet Commonly known as: CYANOCOBALAMIN Take 1 tablet (1,000 mcg total) by mouth daily.      Allergies  Allergen Reactions  . Penicillins Hives and Other (See Comments)    Hives, welts, swelling profusely . Pretty severe reaction Has patient had a PCN reaction causing immediate rash, facial/tongue/throat swelling, SOB or lightheadedness with hypotension: No Has patient had a PCN reaction causing severe rash involving mucus membranes or skin necrosis: Yes Has patient had a PCN reaction that required hospitalization: Yes Has patient had a  PCN reaction occurring within the last 10 years: No If all of the above answers are "NO", then may proceed with Cephalosporin use.   . Lactose Intolerance (Gi) Other (See Comments)    Gi upset    The results of significant diagnostics from this hospitalization (including imaging, microbiology, ancillary and laboratory) are listed below for reference.    Significant Diagnostic Studies: CT ABDOMEN PELVIS WO CONTRAST  Result Date: 12/10/2020 CLINICAL DATA:  Abdominal distension. EXAM: CT ABDOMEN AND PELVIS WITHOUT CONTRAST TECHNIQUE: Multidetector CT imaging of the abdomen and pelvis was performed following the standard protocol without IV contrast. COMPARISON:  April 28, 2019. FINDINGS: Lower chest: No acute abnormality. Hepatobiliary: No focal liver abnormality is seen. No gallstones, gallbladder wall thickening, or biliary dilatation. Pancreas: Unremarkable. No pancreatic ductal dilatation or surrounding inflammatory changes. Spleen: Normal in size without focal abnormality. Adrenals/Urinary Tract: Adrenal glands are unremarkable. Kidneys are normal,  without renal calculi, focal lesion, or hydronephrosis. Bladder is unremarkable. Stomach/Bowel: The stomach appears normal. There is no evidence of bowel obstruction or inflammation. Vascular/Lymphatic: Aortic atherosclerosis. No enlarged abdominal or pelvic lymph nodes. Reproductive: Stable prostatic calcifications. Other: No abdominal wall hernia or abnormality. No abdominopelvic ascites. Musculoskeletal: Status post surgical posterior fusion of L4-5 and L5-S1 with bilateral intrapedicular screw placement. Increased abnormal sclerotic density is noted in T12 vertebral body concerning for possible metastatic disease. IMPRESSION: Increased abnormal sclerotic density is noted in T12 vertebral body concerning for possible metastatic disease. No other significant abnormality seen in the abdomen or pelvis. Aortic Atherosclerosis (ICD10-I70.0). Electronically Signed   By: Marijo Conception M.D.   On: 12/10/2020 18:36   CT Head Wo Contrast  Result Date: 12/10/2020 CLINICAL DATA:  Dizziness. EXAM: CT HEAD WITHOUT CONTRAST TECHNIQUE: Contiguous axial images were obtained from the base of the skull through the vertex without intravenous contrast. COMPARISON:  May 30, 2017. FINDINGS: Brain: Old lacunar infarction is noted in right basal ganglia. No mass effect or midline shift is noted. Ventricular size is within normal limits. There is no evidence of mass lesion, hemorrhage or acute infarction. Vascular: No hyperdense vessel or unexpected calcification. Skull: Normal. Negative for fracture or focal lesion. Sinuses/Orbits: Right sphenoid sinusitis is noted. Other: None. IMPRESSION: No acute intracranial abnormality seen.  Right sphenoid sinusitis. Electronically Signed   By: Marijo Conception M.D.   On: 12/10/2020 18:29   MR Brain W and Wo Contrast  Result Date: 12/11/2020 CLINICAL DATA:  Lower extremity weakness EXAM: MRI HEAD WITHOUT AND WITH CONTRAST TECHNIQUE: Multiplanar, multiecho pulse sequences of the brain  and surrounding structures were obtained without and with intravenous contrast. CONTRAST:  7.65mL GADAVIST GADOBUTROL 1 MMOL/ML IV SOLN COMPARISON:  None. FINDINGS: Brain: No acute infarct, mass effect or extra-axial collection. No acute or chronic hemorrhage. There is multifocal hyperintense T2-weighted signal within the white matter. Generalized volume loss without a clear lobar predilection. The midline structures are normal. There is no abnormal contrast enhancement. Old right basal ganglia small vessel infarct. Vascular: Major flow voids are preserved. Skull and upper cervical spine: Normal calvarium and skull base. Visualized upper cervical spine and soft tissues are normal. Sinuses/Orbits:No paranasal sinus fluid levels or advanced mucosal thickening. No mastoid or middle ear effusion. Normal orbits. IMPRESSION: 1. No acute intracranial or intracranial metastatic disease. 2. Multifocal hyperintense T2-weighted signal within the white matter, most consistent with chronic microvascular ischemia. 3. Generalized volume loss without a clear lobar predilection. Electronically Signed   By: Ulyses Jarred M.D.   On: 12/11/2020  01:38   MR THORACIC SPINE W WO CONTRAST  Result Date: 12/11/2020 CLINICAL DATA:  Difficulty walking. EXAM: MRI THORACIC WITHOUT AND WITH CONTRAST TECHNIQUE: Multiplanar and multiecho pulse sequences of the thoracic spine were obtained without and with intravenous contrast. CONTRAST:  7.32mL GADAVIST GADOBUTROL 1 MMOL/ML IV SOLN COMPARISON:  None. FINDINGS: Alignment:  Normal Vertebrae: There are numerous metastatic lesions throughout the visualized spine. The largest lesion is at T6 where there is circumferential epidural extension of tumor that effaces the thecal sac and compresses the spinal cord. Cord:  No parenchymal abnormality. Paraspinal and other soft tissues: Negative Disc levels: Aside from the epidural abnormality described above, there is no spinal canal or neural foraminal  stenosis. IMPRESSION: 1. Diffuse osseous metastatic disease with largest lesion at T6. 2. Circumferential epidural extension of tumor at T6-T7 with compression of the spinal cord. Critical Value/emergent results were called by telephone at the time of interpretation on 12/11/2020 at 1:38 am to provider [facility], who verbally acknowledged these results. Electronically Signed   By: Ulyses Jarred M.D.   On: 12/11/2020 01:39   MR Lumbar Spine W Wo Contrast  Result Date: 12/11/2020 CLINICAL DATA:  Shortness of breath and lower extremity swelling EXAM: MRI LUMBAR SPINE WITHOUT AND WITH CONTRAST TECHNIQUE: Multiplanar and multiecho pulse sequences of the lumbar spine were obtained without and with intravenous contrast. CONTRAST:  7.80mL GADAVIST GADOBUTROL 1 MMOL/ML IV SOLN COMPARISON:  None. FINDINGS: Segmentation:  Standard Alignment:  Posterior fusion at L4-S1 Vertebrae: Osseous metastases at all levels. The largest lesion is at T12. No pathologic fracture. Conus medullaris and cauda equina: Conus extends to the L1 level. Conus and cauda equina appear normal. Paraspinal and other soft tissues: Negative Disc levels: Limited assessment of the lower lumbar spinal canal due to magnetic susceptibility effects from spinal hardware. No spinal canal or neural foraminal stenosis of the upper lumbar spine. IMPRESSION: 1. Diffuse lumbar osseous metastatic disease without pathologic fracture. 2. No spinal canal or neural foraminal stenosis of the upper lumbar spine. Electronically Signed   By: Ulyses Jarred M.D.   On: 12/11/2020 01:33   CT CHEST ABDOMEN PELVIS WO CONTRAST  Result Date: 12/13/2020 CLINICAL DATA:  Prostate cancer, restaging examination EXAM: CT CHEST, ABDOMEN AND PELVIS WITHOUT CONTRAST TECHNIQUE: Multidetector CT imaging of the chest, abdomen and pelvis was performed following the standard protocol without IV contrast. COMPARISON:  CT abdomen pelvis 12/10/2020, CT chest 09/06/2020 FINDINGS: CT CHEST  FINDINGS Cardiovascular: Moderate coronary artery calcification. Global cardiac size within normal limits. No pericardial effusion. Central pulmonary arteries are of normal caliber. Mild atherosclerotic calcification within the thoracic aorta. No aortic aneurysm. Mediastinum/Nodes: There is progressive thoracic adenopathy involving the right paratracheal and prevascular lymph node groups. By example, a high right paratracheal lymph node measures 12 mm in short axis diameter on image # 17/2 (previously measuring 7 mm). A prevascular lymph node measures 9 mm in short axis diameter, image # 25/2 (previously measuring 4 mm). The esophagus is unremarkable. Visualized thyroid is unremarkable. Lungs/Pleura: Mild centrilobular emphysema. Mild bibasilar atelectasis. No suspicious focal pulmonary nodules or infiltrates. No pneumothorax or pleural effusion. Central airways are widely patent. Musculoskeletal: Multiple sclerotic metastases are again identified throughout the visualized axial skeleton. Progressive sclerosis and permeative changes seen involving the anterolateral third rib with associated pathologic fracture now evident. New sclerotic metastasis within the anterolateral fourth rib. New sclerotic foci within the left twelfth, tenth and fourth ribs as well as increasing sclerosis involving the left third rib and sixth  rib. New sclerotic metastasis within the tenth vertebral body. Extensive sclerosis with associated paravertebral soft tissue again noted at T6 similar 2 that noted on prior examination. Epidural extension of the soft tissue component at this level is better appreciated on prior MRI examination of 12/11/2020. CT ABDOMEN PELVIS FINDINGS Hepatobiliary: No focal liver abnormality is seen. No gallstones, gallbladder wall thickening, or biliary dilatation. Pancreas: Unremarkable Spleen: Unremarkable Adrenals/Urinary Tract: Adrenal glands are unremarkable. Kidneys are normal, without renal calculi, focal  lesion, or hydronephrosis. Bladder is unremarkable. Stomach/Bowel: Stomach is within normal limits. Appendix appears normal. No evidence of bowel wall thickening, distention, or inflammatory changes. No free intraperitoneal gas or fluid. Vascular/Lymphatic: Mild aortoiliac atherosclerotic calcification. No aortic aneurysm. No pathologic thoracic adenopathy within the abdomen and pelvis. Reproductive: Prostate is unremarkable. Other: No abdominal wall hernia. Musculoskeletal: Lumbar fusion with instrumentation and posterior decompression of L5 again noted. Multiple sclerotic metastases are noted within the lumbar spine and pelvis bilaterally. No pathologic fracture. IMPRESSION: Interval disease progression with progressive sclerotic metastases within the thorax and progressive borderline mediastinal adenopathy. Mixed lytic and sclerotic lesion involving the T6 vertebral body with associated paravertebral soft tissue mass and epidural extension is not significantly changed from prior examination. New minimally displaced pathologic fracture of the a right third rib. Widespread sclerotic metastases within the lumbar spine and pelvis appears grossly stable. Moderate coronary artery calcification. Mild centrilobular emphysema. Aortic Atherosclerosis (ICD10-I70.0). Electronically Signed   By: Fidela Salisbury MD   On: 12/13/2020 22:28    Microbiology: Recent Results (from the past 240 hour(s))  Resp Panel by RT-PCR (Flu A&B, Covid) Nasopharyngeal Swab     Status: None   Collection Time: 12/10/20  6:33 PM   Specimen: Nasopharyngeal Swab; Nasopharyngeal(NP) swabs in vial transport medium  Result Value Ref Range Status   SARS Coronavirus 2 by RT PCR NEGATIVE NEGATIVE Final    Comment: (NOTE) SARS-CoV-2 target nucleic acids are NOT DETECTED.  The SARS-CoV-2 RNA is generally detectable in upper respiratory specimens during the acute phase of infection. The lowest concentration of SARS-CoV-2 viral copies this assay  can detect is 138 copies/mL. A negative result does not preclude SARS-Cov-2 infection and should not be used as the sole basis for treatment or other patient management decisions. A negative result may occur with  improper specimen collection/handling, submission of specimen other than nasopharyngeal swab, presence of viral mutation(s) within the areas targeted by this assay, and inadequate number of viral copies(<138 copies/mL). A negative result must be combined with clinical observations, patient history, and epidemiological information. The expected result is Negative.  Fact Sheet for Patients:  EntrepreneurPulse.com.au  Fact Sheet for Healthcare Providers:  IncredibleEmployment.be  This test is no t yet approved or cleared by the [place] FDA and  has been authorized for detection and/or diagnosis of SARS-CoV-2 by FDA under an Emergency Use Authorization (EUA). This EUA will remain  in effect (meaning this test can be used) for the duration of the COVID-19 declaration under Section 564(b)(1) of the Act, 21 U.S.C.section 360bbb-3(b)(1), unless the authorization is terminated  or revoked sooner.       Influenza A by PCR NEGATIVE NEGATIVE Final   Influenza B by PCR NEGATIVE NEGATIVE Final    Comment: (NOTE) The Xpert Xpress SARS-CoV-2/FLU/RSV plus assay is intended as an aid in the diagnosis of influenza from Nasopharyngeal swab specimens and should not be used as a sole basis for treatment. Nasal washings and aspirates are unacceptable for Xpert Xpress SARS-CoV-2/FLU/RSV testing.  Fact Sheet  for Patients: EntrepreneurPulse.com.au  Fact Sheet for Healthcare Providers: IncredibleEmployment.be  This test is not yet approved or cleared by the [place] FDA and has been authorized for detection and/or diagnosis of SARS-CoV-2 by FDA under an Emergency Use Authorization (EUA). This EUA will  remain in effect (meaning this test can be used) for the duration of the COVID-19 declaration under Section 564(b)(1) of the Act, 21 U.S.C. section 360bbb-3(b)(1), unless the authorization is terminated or revoked.  Performed at [facility], [address] [place], [place] [address]      Labs: Basic Metabolic Panel: Recent Labs  Lab 12/14/20 0423 12/18/20 0416  NA 135  --   K 4.7  --   CL 100  --   CO2 27  --   GLUCOSE 121*  --   BUN 32*  --   CREATININE 1.32* 1.31*  CALCIUM 9.1  --   MG 2.2  --   PHOS 3.7  --    Liver Function Tests: Recent Labs  Lab 12/14/20 0423  AST 25  ALT 22  ALKPHOS 122  BILITOT 0.4  PROT 6.0*  ALBUMIN 3.4*   CBC: Recent Labs  Lab 12/14/20 0423  WBC 5.2  NEUTROABS 4.5  HGB 11.1*  HCT 35.0*  MCV 105.4*  PLT 145*   CBG: Recent Labs  Lab 12/13/20 1636 12/13/20 2123 12/14/20 0737 12/14/20 1119 12/14/20 1637  GLUCAP 126* 128* 125* 139* 122*    Principal Problem:   Cord compression [facility]) Active Problems:   Prostate cancer metastatic to bone (HCC)   Pain of metastatic malignancy   Time coordinating discharge: 25 minutes  Signed:  [name], MD  [facility]  12/20/2020, 1:03 PM

## 2020-12-20 NOTE — Progress Notes (Signed)
Patient to transfer to [facility] via [facility] to Inpatient Rehab. Report given to [facility] RN

## 2020-12-20 NOTE — TOC Transition Note (Signed)
Transition of Care [facility]) - CM/SW Discharge Note   Patient Details  Name: Terry Mitchell MRN: [id number] Date of Birth: 1951-07-02  Transition of Care [facility]) CM/SW Contact:  Lennart Pall, LCSW Phone Number: 12/20/2020, 2:04 PM   Clinical Narrative:    Pt has bed available for him today at [facility].  [facility] AC has arranged for [facility] to pick up pt at 2 pm (following 12noon radiation tx).  No further TOC needs.   Final next level of care: IP Rehab Facility Barriers to Discharge: Barriers Resolved   Patient Goals and CMS Choice Patient states their goals for this hospitalization and ongoing recovery are:: pt goal is to return directly home      Discharge Placement              Patient chooses bed at:  [facility] Inpatient [facility]) Patient to be transferred to facility by: [place] Name of family member notified: pt and daughter informed by [facility] admissions coordinator Patient and family notified of of transfer: 12/20/20  Discharge Plan and Services In-house Referral: Clinical Social Work              DME Arranged: N/A DME Agency: NA                  Social Determinants of Health ([place]) Interventions     Readmission Risk Interventions No flowsheet data found.

## 2020-12-20 NOTE — H&P (Signed)
Physical Medicine and Rehabilitation Admission H&P     CC: Myelopathy due to metastatic prostate cancer.      HPI: Terry Mitchell is a 70 year old male with history of depression, HTN, prostate cancer off treatment/followed by hospice;  who was admitted on 12/11/20 with reports of back pain for 3-4 months with BLE weakness and started developing progressive numbness from abdomen down with inability to walk. He was found to have circumferential tumor at T7/T7 with compression of spinal cord and diffuse osseous metastatic disease without pathologic Fx and largest lesion at T12. Dr. Cari Caraway recommended high dose steroids as well as hem/onc/radiation input. He was evaluated by Dr. Lisbeth Renshaw and started on palliative XRT immediately.  Dr. Irene Limbo consulted for input --> CT abdomen/pelvis/chest done for staging and showed enlargening bilateral rib lesions, No PE and abdomen/pelvis without significant abnormality.    Patient revoked  hospice services and  elected on full scope of care with palliative systemic therapies as well as therapy to regain prior functional status. . Steroids to be slowly weaned off and he is to follow up with Dr. Tasia Catchings on outpatient basis.   Therapy ongoing and patient continues to be limited by BLE weakness, multiple falls as well as balance deficits. CIR was consulted for progressive therapies.     Pt reports voiding well- R side of his body has been stronger since he was a child- just been worse since lumbar surgery.  Also notes has hemorrhoids- they are irritated due to pushing- has control of stool-LBM in last 24 hours.    Review of Systems  Constitutional: Negative for chills and fever.  HENT: Negative for hearing loss.   Eyes: Negative for blurred vision and double vision.  Respiratory: Negative for cough and shortness of breath.   Cardiovascular: Negative for chest pain, palpitations and leg swelling.  Gastrointestinal: Negative for constipation, heartburn and nausea.   Genitourinary: Negative for dysuria.  Musculoskeletal: Positive for myalgias.  Skin: Negative for rash.  Neurological: Positive for sensory change, focal weakness and weakness. Negative for dizziness and headaches.  Psychiatric/Behavioral: The patient is not nervous/anxious and does not have insomnia.   All other systems reviewed and are negative.         Past Medical History:  Diagnosis Date  . Depression      recently went on disability d/t diagnosis  . History of recent fall 01/2018    missed the last step on ladder, causing back discomfort  . Hypertension    . Prostate cancer ([place]) 12/2017    prostate           Past Surgical History:  Procedure Laterality Date  . BACK SURGERY   2001     2 rods and 6 screws in back  . COLONOSCOPY WITH PROPOFOL N/A 09/24/2019    Procedure: COLONOSCOPY WITH PROPOFOL;  Surgeon: Jonathon Bellows, MD;  Location: [facility] ENDOSCOPY;  Service: Gastroenterology;  Laterality: N/A;  . Left shoulder surgery Left 1998    removed a piece of bone around collar bone  . PROSTATE BIOPSY N/A 02/28/2018    Procedure: PROSTATE BIOPSY;  Surgeon: Abbie Sons, MD;  Location: [facility] ORS;  Service: Urology;  Laterality: N/A;  . TRANSRECTAL ULTRASOUND N/A 02/28/2018    Procedure: TRANSRECTAL ULTRASOUND;  Surgeon: Abbie Sons, MD;  Location: [facility] ORS;  Service: Urology;  Laterality: N/A;           Family History  Problem Relation Age of Onset  .  Stroke Mother    . Kidney failure Mother    . Diabetes Maternal Aunt        Social History:  Lives with fiancee (LVAD patient). He reports that he quit smoking about 16 months ago. His smoking use included cigars. He smoked 2.00 packs per day. He has never used smokeless tobacco. He reports current alcohol use of about 9.0 standard drinks of alcohol per week. He reports prior use of  Drugs: Cocaine and "Crack" cocaine.          Allergies  Allergen Reactions  . Penicillins Hives and Other (See Comments)      Hives, welts,  swelling profusely . Pretty severe reaction Has patient had a PCN reaction causing immediate rash, facial/tongue/throat swelling, SOB or lightheadedness with hypotension: No Has patient had a PCN reaction causing severe rash involving mucus membranes or skin necrosis: Yes Has patient had a PCN reaction that required hospitalization: Yes Has patient had a PCN reaction occurring within the last 10 years: No If all of the above answers are "NO", then may proceed with Cephalosporin use.    . Lactose Intolerance (Gi) Other (See Comments)      Gi upset            Medications Prior to Admission  Medication Sig Dispense Refill  . albuterol (VENTOLIN HFA) 108 (90 Base) MCG/ACT inhaler Inhale 2 puffs into the lungs every 6 (six) hours as needed for wheezing or shortness of breath. 6.7 g 0  . amLODipine (NORVASC) 5 MG tablet Take 1 tablet (5 mg total) by mouth daily. 30 tablet 0  . atorvastatin (LIPITOR) 40 MG tablet Take 40 mg by mouth daily.      Marland Kitchen loratadine (CLARITIN) 10 MG tablet Take 10 mg by mouth daily. In prep for fulphila      . losartan (COZAAR) 50 MG tablet Take 50 mg by mouth daily after breakfast.    2  . morphine (MS CONTIN) 15 MG 12 hr tablet Take 1 tablet (15 mg total) by mouth every 12 (twelve) hours. 60 tablet 0  . ondansetron (ZOFRAN) 8 MG tablet Take 1 tablet (8 mg total) by mouth every 8 (eight) hours as needed for nausea or vomiting. (Patient not taking: No sig reported) 45 tablet 2  . oxyCODONE-acetaminophen (PERCOCET) 5-325 MG tablet Take 1-2 tablets by mouth every 4 (four) hours as needed for severe pain. 90 tablet 0  . vitamin B-12 (CYANOCOBALAMIN) 1000 MCG tablet Take 1 tablet (1,000 mcg total) by mouth daily. 90 tablet 1      Drug Regimen Review  Drug regimen was reviewed and remains appropriate with no significant issues identified   Home: Home Living Family/patient expects to be discharged to:: Inpatient rehab Living Arrangements: Spouse/significant other Available  Help at Discharge: Family Type of Home: House ([facility] in [place]) Technical brewer of Steps: 2 Home Layout: Two level,Bed/bath upstairs Alternate Level Stairs-Number of Steps: flight Alternate Level Stairs-Rails: Right Home Equipment: Cane - single point,Bedside commode Additional Comments: wife has an LVAD,  pt also reports wife has some early dementia/STM deficits   Functional History: Prior Function Level of Independence: Independent   Functional Status:  Mobility: Bed Mobility Overal bed mobility: Needs Assistance Bed Mobility: Sit to Supine Supine to sit: Min guard,HOB elevated Sit to supine: Min guard General bed mobility comments: for safety (on BSC on arrival) Transfers Overall transfer level: Needs assistance Equipment used: Rolling walker (2 wheeled) Transfers: Sit to/from Stand Sit to Stand: Min assist,Mod assist  Stand pivot transfers: Min assist,Mod assist General transfer comment: cues for hand placment, sequence and to control descent. Pt requiring assist to balance and steady on transition to RW; requring assist to prevent L knee buckling and balance on stand-step pivot transfer, heavily reliant on UEs Ambulation/Gait Ambulation/Gait assistance: Min assist,Mod assist Gait Distance (Feet): 50 Feet (25 feet x 2 one seated rest break) Assistive device: Rolling walker (2 wheeled) Gait Pattern/deviations: Step-to pattern,Decreased stride length,Decreased weight shift to left,Ataxic,Narrow base of support,Decreased dorsiflexion - left,Scissoring General Gait Details: VERY unsteady gait present with ataxia and R LE drag with excessive lean on walker but able to advance gait/steppage. Pt very motivated. Gait velocity: decreased   ADL: ADL Overall ADL's : Needs assistance/impaired Eating/Feeding: Independent Grooming: Set up,Sitting,Wash/dry hands,Wash/dry face,Oral care Grooming Details (indicate cue type and reason): Pt asked to unlock his recliner. Pt  then able to pull himself to sink and perform grooming at chair level. Upper Body Bathing: Set up,Sitting Lower Body Bathing: Minimal assistance,Sitting/lateral leans,Sit to/from stand Upper Body Dressing : Set up,Sitting Lower Body Dressing: Minimal assistance,Sitting/lateral leans,Bed level Toilet Transfer: Minimal assistance,Cueing for safety,RW Toilet Transfer Details (indicate cue type and reason): patient min A to power up to standing from recliner chair with rolling walker Toileting- Clothing Manipulation and Hygiene: Minimal assistance,Sitting/lateral lean,Sit to/from stand Functional mobility during ADLs: Minimal assistance,Rolling walker General ADL Comments: patient reports alreadying performing grooming, bathing and dressing this AM. Agreeable to functional ambulation with walker, patient initially min A ambulating with walker due to numbness and having to almost drag R LE at times. Unfortunately while making small turn just outside patient's room door patient placed increased weight onto R side of the walker causing it to tip and patient lose his balance. OT assist with lowering to floor with gait belt. Patient needing x3 assist to get back into recliner chair.   Cognition: Cognition Overall Cognitive Status: Within Functional Limits for tasks assessed Orientation Level: Oriented X4 Cognition Arousal/Alertness: Awake/alert Behavior During Therapy: WFL for tasks assessed/performed Overall Cognitive Status: Within Functional Limits for tasks assessed General Comments: AxO x 3 very pleasant     Blood pressure (!) 152/72, pulse 69, temperature 98.5 F (36.9 C), temperature source Oral, resp. rate 16, height 6' (1.829 m), weight 87.2 kg, SpO2 100 %. Physical Exam Vitals and nursing note reviewed. Exam conducted with a chaperone present.  Constitutional:      Appearance: Normal appearance.     Comments: Pt sitting up in bed, appears younger than stated age; appropriate, awake,  NAD; Charge nurse at bedside as well as nurse coordinator  HENT:     Head: Normocephalic and atraumatic.     Comments: Smile equal Partial dentures    Right Ear: External ear normal.     Left Ear: External ear normal.     Nose: Nose normal. No congestion.     Mouth/Throat:     Mouth: Mucous membranes are moist.     Pharynx: Oropharynx is clear. No oropharyngeal exudate.  Eyes:     General:        Right eye: No discharge.        Left eye: No discharge.     Extraocular Movements: Extraocular movements intact.  Cardiovascular:     Rate and Rhythm: Normal rate and regular rhythm.     Heart sounds: Normal heart sounds. No murmur heard. No gallop.   Pulmonary:     Comments: CTA B/L- no W/R/R- good air movement Abdominal:     Comments:  Soft, NT, ND, (+)BS - normoactive  Musculoskeletal:        General: No swelling.     Cervical back: Normal range of motion and neck supple.     Comments: Pt's strength in UEs 5/5 B/L in biceps, triceps, WE, grip and finger abd B/L LEs: RLE- HF 4-/5, KE 4-/5, DF 4+/5, PF 4-/5 LLE: HF 3/5, KE 4-/5, DF 3+/5, and PF 4-/5  Skin:    General: Skin is warm and dry.     Comments: Has IV L hand- looks OK Skin looks great except has radiation mark as well as fungal toenails/very thickened   Neurological:     Mental Status: He is alert and oriented to person, place, and time.     Sensory: Sensory deficit present.     Motor: Weakness present.     Comments: Speech clear. Sensory deficits from waist down with paraplegia.  Sensation decreased a lot from T7 on down to S5 B/L   Psychiatric:        Mood and Affect: Mood normal.        Behavior: Behavior normal.        Lab Results Last 48 Hours  No results found for this or any previous visit (from the past 48 hour(s)).   Imaging Results (Last 48 hours)  No results found.           Medical Problem List and Plan: 1.  T6 paraplegia ASIA D secondary to mets to Spinal cord/tumors compressing [place]/prostate  [place]             -patient may  shower             -ELOS/Goals:10-14 days- goals supervision 2.  Antithrombotics: -DVT/anticoagulation:  Pharmaceutical: Lovenox  -will need for at least 2 months due to [place], no matter how far he's walking             -antiplatelet therapy: N/a 3. Pain Management: MS contin bid with oxycodone prn. con't and change as required 4. Mood: LCSW to follow for evaluation and support.              -antipsychotic agents: N/a 5. Neuropsych: This patient is capable of making decisions on his own behalf. 6. Skin/Wound Care: Routine pressure relief measures.  7. Fluids/Electrolytes/Nutrition: Monitor I/O. Check lytes in am.  8. HTN: Monitor BP tid--continue Norvasc.              --Resume Cozaar if BP continues to be labile   9. Metastatic prostate cancer to spine: XRT ongoing             --slow steroid taper--4 mg tid X 3 days followed by bid X 7 days-->daily X 7 days, then 2 mg daily X 7d days.   10. At risk for spasticity and neurogenic bowel and bladder- will scan bladder to make sure emptying and monitor bowels.      Bary Leriche, PA-C 12/20/2020      I have personally performed a face to face diagnostic evaluation of this patient and formulated the key components of the plan.  Additionally, I have personally reviewed laboratory data, imaging studies, as well as relevant notes and concur with the physician assistant's documentation above.

## 2020-12-20 NOTE — Progress Notes (Signed)
Patient arrive on the via ambulance, A&O x4, denied pain or discomfort. Assessment done. Vitals stable. Educated on safety plan and POC. We continue to monitor.

## 2020-12-21 ENCOUNTER — Ambulatory Visit
Admission: RE | Admit: 2020-12-21 | Discharge: 2020-12-21 | Disposition: A | Discharge status: Home / Self Care | Payer: Medicare HMO | Source: Ambulatory Visit | Attending: Radiation Oncology | Admitting: Radiation Oncology

## 2020-12-21 DIAGNOSIS — Z510 Encounter for antineoplastic radiation therapy: Secondary | ICD-10-CM | POA: Insufficient documentation

## 2020-12-21 DIAGNOSIS — C7951 Secondary malignant neoplasm of bone: Secondary | ICD-10-CM | POA: Diagnosis not present

## 2020-12-21 DIAGNOSIS — M21372 Foot drop, left foot: Secondary | ICD-10-CM

## 2020-12-21 DIAGNOSIS — C61 Malignant neoplasm of prostate: Secondary | ICD-10-CM | POA: Diagnosis not present

## 2020-12-21 DIAGNOSIS — G8222 Paraplegia, incomplete: Secondary | ICD-10-CM | POA: Diagnosis not present

## 2020-12-21 LAB — CBC WITH DIFFERENTIAL/PLATELET
Abs Immature Granulocytes: 0.06 10*3/uL (ref 0.00–0.07)
Basophils Absolute: 0 10*3/uL (ref 0.0–0.1)
Basophils Relative: 0 %
Eosinophils Absolute: 0 10*3/uL (ref 0.0–0.5)
Eosinophils Relative: 0 %
HCT: 35.8 % — ABNORMAL LOW (ref 39.0–52.0)
Hemoglobin: 12.2 g/dL — ABNORMAL LOW (ref 13.0–17.0)
Immature Granulocytes: 1 %
Lymphocytes Relative: 5 %
Lymphs Abs: 0.3 10*3/uL — ABNORMAL LOW (ref 0.7–4.0)
MCH: 34.8 pg — ABNORMAL HIGH (ref 26.0–34.0)
MCHC: 34.1 g/dL (ref 30.0–36.0)
MCV: 102 fL — ABNORMAL HIGH (ref 80.0–100.0)
Monocytes Absolute: 0.7 10*3/uL (ref 0.1–1.0)
Monocytes Relative: 12 %
Neutro Abs: 4.6 10*3/uL (ref 1.7–7.7)
Neutrophils Relative %: 82 %
Platelets: 133 10*3/uL — ABNORMAL LOW (ref 150–400)
RBC: 3.51 MIL/uL — ABNORMAL LOW (ref 4.22–5.81)
RDW: 12.7 % (ref 11.5–15.5)
WBC: 5.5 10*3/uL (ref 4.0–10.5)
nRBC: 0 % (ref 0.0–0.2)

## 2020-12-21 LAB — COMPREHENSIVE METABOLIC PANEL
ALT: 43 U/L (ref 0–44)
AST: 23 U/L (ref 15–41)
Albumin: 2.9 g/dL — ABNORMAL LOW (ref 3.5–5.0)
Alkaline Phosphatase: 106 U/L (ref 38–126)
Anion gap: 8 (ref 5–15)
BUN: 31 mg/dL — ABNORMAL HIGH (ref 8–23)
CO2: 28 mmol/L (ref 22–32)
Calcium: 8.8 mg/dL — ABNORMAL LOW (ref 8.9–10.3)
Chloride: 95 mmol/L — ABNORMAL LOW (ref 98–111)
Creatinine, Ser: 1.24 mg/dL (ref 0.61–1.24)
GFR, Estimated: >60 mL/min (ref >60)
Glucose, Bld: 116 mg/dL — ABNORMAL HIGH (ref 70–99)
Potassium: 5.6 mmol/L — ABNORMAL HIGH (ref 3.5–5.1)
Sodium: 131 mmol/L — ABNORMAL LOW (ref 135–145)
Total Bilirubin: 0.3 mg/dL (ref 0.3–1.2)
Total Protein: 5.2 g/dL — ABNORMAL LOW (ref 6.5–8.1)

## 2020-12-21 NOTE — Progress Notes (Signed)
PROGRESS NOTE   Subjective/Complaints:   Pt reports pain OK- rating 4-4.5/10.   LBM overnight-  Voiding well.  Slept on and off- which is chronic for him- he used to work 3rd shift. Asking about  Boot for L foot- explained will come today most likely.     ROS:  Pt denies SOB, abd pain, CP, N/V/C/D, and vision changes   Objective:   No results found. Recent Labs    12/21/20 0511  WBC 5.5  HGB 12.2*  HCT 35.8*  PLT 133*   Recent Labs    12/21/20 0511  NA 131*  K 5.6*  CL 95*  CO2 28  GLUCOSE 116*  BUN 31*  CREATININE 1.24  CALCIUM 8.8*    Intake/Output Summary (Last 24 hours) at 12/21/2020 0923 Last data filed at 12/21/2020 0746 Gross per 24 hour  Intake 400 ml  Output 302 ml  Net 98 ml        Physical Exam: Vital Signs Blood pressure 139/79, pulse (!) 57, temperature 97.9 F (36.6 C), temperature source Oral, resp. rate 18, weight 88 kg, SpO2 97 %.    General: awake, alert, appropriate, sitting up in bed; NAD HENT: conjugate gaze; oropharynx moist CV: borderline bradycardi rate; no JVD Pulmonary: CTA B/L; no W/R/R- good air movement GI: soft, NT, ND, (+)BS Psychiatric: appropriate Neurological: alert Musculoskeletal:  General: No swelling.  Cervical back: Normal range of motionand neck supple.  Comments: Pt's strength in UEs 5/5 B/L in biceps, triceps, WE, grip and finger abd B/L LEs: RLE- HF 4-/5, KE 4-/5, DF 4+/5, PF 4-/5 LLE: HF 3/5, KE 4-/5, DF 3+/5, and PF 4-/5 Skin: General: Skin is warmand dry.  Comments: Has IV L hand- looks OK Skin looks great except has radiation mark as well as fungal toenails/very thickened  Neurological:  Mental Status: He is alertand oriented to person, place, and time.  Sensory: Sensory deficitpresent.  Motor: [place].  Comments: Speech clear. Sensory deficits from waist down with paraplegia.  Sensation  decreased a lot from T7 on down to S5 B/L    Assessment/Plan: 1. Functional deficits which require 3+ hours per day of interdisciplinary therapy in a comprehensive inpatient rehab setting.  Physiatrist is providing close team supervision and 24 hour management of active medical problems listed below.  Physiatrist and rehab team continue to assess barriers to discharge/monitor patient progress toward functional and medical goals  Care Tool:  Bathing    Body parts bathed by patient: Right arm,Left arm,Chest,Abdomen,Front perineal area,Buttocks,Right upper leg,Left upper leg,Right lower leg,Left lower leg,Face         Bathing assist Assist Level: Minimal Assistance - Patient > 75% (Min A for standing balance)     Upper Body Dressing/Undressing Upper body dressing   What is the patient wearing?: Pull over shirt    Upper body assist Assist Level: Set up assist    Lower Body Dressing/Undressing Lower body dressing      What is the patient wearing?: Underwear/pull up,Pants     Lower body assist Assist for lower body dressing: Minimal Assistance - Patient > 75%     Toileting Toileting    Toileting assist Assist for toileting:  Minimal Assistance - Patient > 75%     Transfers Chair/bed transfer  Transfers assist     Chair/bed transfer assist level: Moderate Assistance - Patient 50 - 74%     Locomotion Ambulation   Ambulation assist              Walk 10 feet activity   Assist           Walk 50 feet activity   Assist           Walk 150 feet activity   Assist           Walk 10 feet on uneven surface  activity   Assist           Wheelchair     Assist               Wheelchair 50 feet with 2 turns activity    Assist            Wheelchair 150 feet activity     Assist          Blood pressure 139/79, pulse (!) 57, temperature 97.9 F (36.6 C), temperature source Oral, resp. rate 18, weight 88 kg,  SpO2 97 %.  Medical Problem List and Plan: 1.T6 paraplegia ASIA Dsecondary to mets to Spinal cord/tumors compressing [place]/prostate [place] -patient may shower -ELOS/Goals:10-14 days- goals supervision First day of therapy today- con't PT and OT 2. Antithrombotics: -DVT/anticoagulation:Pharmaceutical:Lovenox -will need for at least 2 months due to [place], no matter how far he's walking -antiplatelet therapy: N/a 3. Pain Management:MS contin bid with oxycodone prn.con't and change as required 4. Mood:LCSW to follow for evaluation and support. -antipsychotic agents: N/a 5. Neuropsych: This patientiscapable of making decisions on hisown behalf. 6. Skin/Wound Care:Routine pressure relief measures. 7. Fluids/Electrolytes/Nutrition:Monitor I/O. Check lytes in am.  8. HTN: Monitor BP tid--continue Norvasc.  --ResumeCozaarif BP continues to be labile  6/1- BP 139/79 this AM- overall borderline high- will wait to restart Cozaar- Of note, HR is 57 this AM 9. Metastatic prostate cancer to spine: XRT ongoing --slow steroid taper--4 mg tid X 3 days followed by bid X 7 days-->daily X 7 days, then 2 mg daily X 7d days.  10. At risk for spasticity and neurogenic bowel and bladder- will scan bladder to make sure emptying and monitor bowels.  6/1- Pt reports small BM overnight- con't to monitor for urinary retention- as well as con't bowel meds for constipation.  11. L foot drop  6/1- will make sure has PRAFO ordered for L and wear at night.     LOS: 1 days A FACE TO FACE EVALUATION WAS PERFORMED  Raegan Sipp 12/21/2020, 9:23 AM

## 2020-12-21 NOTE — Progress Notes (Signed)
Inpatient Rehabilitation  Patient information reviewed and entered into eRehab system by Betrice Wanat M. Michaeal Davis, M.A., CCC/SLP, PPS Coordinator.  Information including medical coding, functional ability and quality indicators will be reviewed and updated through discharge.    

## 2020-12-21 NOTE — Evaluation (Signed)
Occupational Therapy Assessment and Plan  Patient Details  Name: Terry Mitchell MRN: [id number] Date of Birth: 01-Jul-1951  OT Diagnosis: abnormal posture, ataxia, muscle weakness (generalized) and incomplete paraplegia at T6/7 Rehab Potential:   ELOS: 21-24 days   Today's Date: 12/21/2020 OT Individual Time:  55- 130     86 min  Hospital Problem: Principal Problem:   Acute incomplete paraplegia ([place]) Active Problems:   Prostate cancer metastatic to bone [facility])   Past Medical History:  Past Medical History:  Diagnosis Date  . Depression    recently went on disability d/t diagnosis  . History of recent fall 01/2018   missed the last step on ladder, causing back discomfort  . Hypertension   . Prostate cancer ([place]) 12/2017   prostate   Past Surgical History:  Past Surgical History:  Procedure Laterality Date  . BACK SURGERY  2001    2 rods and 6 screws in back  . COLONOSCOPY WITH PROPOFOL N/A 09/24/2019   Procedure: COLONOSCOPY WITH PROPOFOL;  Surgeon: Jonathon Bellows, MD;  Location: [facility] ENDOSCOPY;  Service: Gastroenterology;  Laterality: N/A;  . Left shoulder surgery Left 1998   removed a piece of bone around collar bone  . PROSTATE BIOPSY N/A 02/28/2018   Procedure: PROSTATE BIOPSY;  Surgeon: Abbie Sons, MD;  Location: [facility] ORS;  Service: Urology;  Laterality: N/A;  . TRANSRECTAL ULTRASOUND N/A 02/28/2018   Procedure: TRANSRECTAL ULTRASOUND;  Surgeon: Abbie Sons, MD;  Location: [facility] ORS;  Service: Urology;  Laterality: N/A;    Assessment & Plan Clinical Impression: Terry Mitchell is a 70 year old male with history of depression, HTN, prostate canceroff treatment/followed by hospice;who was admitted on 12/11/20 with reports of back pain for 3-4 months with BLE weakness and started developing progressive numbness from abdomen down with inability to walk. He was found to have circumferential tumor at T7/T7 with compression of spinal cord and diffuse osseous metastatic  disease without pathologic Fx and largest lesion at T12. Dr. Cari Caraway recommended high dose steroids as well as hem/onc/radiation input. He was evaluated by Dr. Lisbeth Renshaw and started on palliative XRT immediately. Dr. Irene Limbo consulted for input -->CT abdomen/pelvis/chest done for staging and showed enlargening bilateral rib lesions, No PE and abdomen/pelvis without significant abnormality.   Patient revoked hospice services and elected on full scope of care with palliative systemic therapies as well as therapy to regain prior functional status. . Steroids to be slowly weaned off and he is to follow up with Dr. Tasia Catchings on outpatient basis. Therapy ongoing and patient continues to be limited by BLE weakness, multiple falls as well as balance deficits. CIR was consulted for progressive therapies.  Patient transferred to CIR on 12/20/2020 .    Patient currently requires mod with basic self-care skills secondary to muscle weakness, decreased cardiorespiratoy endurance, impaired timing and sequencing, abnormal tone, unbalanced muscle activation, ataxia, decreased coordination and decreased motor planning, decreased motor planning and decreased sitting balance, decreased standing balance, decreased postural control and decreased balance strategies.  Prior to hospitalization, patient could complete BADL with independent .  Patient will benefit from skilled intervention to decrease level of assist with basic self-care skills and increase independence with basic self-care skills prior to discharge home with care partner.  Anticipate patient will require 24 hour supervision and follow up home health.  OT - End of Session Activity Tolerance: Tolerates 30+ min activity with multiple rests Endurance Deficit: Yes Endurance Deficit Description: frequent rest breaks during functional activity OT Assessment  OT Patient demonstrates impairments in the following area(s): Balance;Endurance;Motor;Pain;Safety;Sensory OT Basic  ADL's Functional Problem(s): Grooming;Bathing;Dressing;Toileting OT Transfers Functional Problem(s): Toilet;Tub/Shower OT Additional Impairment(s): None OT Plan OT Intensity: Minimum of 1-2 x/day, 45 to 90 minutes OT Frequency: 5 out of 7 days OT Duration/Estimated Length of Stay: 21-24 days OT Treatment/Interventions: Balance/vestibular training;Discharge planning;Functional electrical stimulation;Pain management;Self Care/advanced ADL retraining;Therapeutic Activities;UE/LE Coordination activities;Visual/perceptual remediation/compensation;Therapeutic Exercise;Skin care/wound managment;Patient/family education;Functional mobility training;Disease mangement/prevention;Community reintegration;DME/adaptive equipment instruction;Neuromuscular re-education;Psychosocial support;Splinting/orthotics;UE/LE Strength taining/ROM;Wheelchair propulsion/positioning OT Self Feeding Anticipated Outcome(s): no goal OT Basic Self-Care Anticipated Outcome(s): Supervision OT Toileting Anticipated Outcome(s): Supervision OT Bathroom Transfers Anticipated Outcome(s): Supervision OT Recommendation Recommendations for Other Services: Neuropsych consult Patient destination: Home Follow Up Recommendations: Home health OT Equipment Recommended: To be determined   OT Evaluation Precautions/Restrictions  Precautions Precautions: Fall Precaution Comments: ataxic LEs, fall risk Restrictions Weight Bearing Restrictions: No Vital Signs Therapy Vitals Temp: 98.4 F (36.9 C) Pulse Rate: 66 Resp: 17 BP: (!) 124/93 Patient Position (if appropriate): Sitting Oxygen Therapy SpO2: 100 % O2 Device: Room Air Home Living/Prior [place] expects to be discharged to:: Private residence Living Arrangements: Spouse/significant other,Children Available Help at Discharge: Family,Available 24 hours/day Type of Home: House Home Access: Stairs to enter [employer] of Steps:  2 Entrance Stairs-Rails: None Home Layout: Two level,Bed/bath upstairs Alternate Level Stairs-Number of Steps: flight Alternate Level Stairs-Rails: Right Additional Comments: wife has an LVAD,  pt also reports wife has some early dementia/STM deficits; reports adult daughter can provide assist  Lives With: Significant other IADL History Occupation: Retired Prior Function Level of Independence: Independent with gait,Independent with transfers,Independent with basic ADLs  Able to Take Stairs?: Yes Driving: Yes Vocation: Retired Leisure: Hobbies-yes (Comment) Comments: enjoys riding motorcycle Vision Baseline Vision/History: No visual deficits (pt states near sighted, difficulty with distance) Patient Visual Report: No change from baseline Vision Assessment?: No apparent visual deficits Perception  Perception: Within Functional Limits Praxis Praxis: Intact Cognition Overall Cognitive Status: Within Functional Limits for tasks assessed Arousal/Alertness: Awake/alert Orientation Level: Person;Place;Situation Person: Oriented Place: Oriented Situation: Oriented Year: 2022 Month: June Day of Week: Correct Memory: Appears intact Immediate Memory Recall: Sock;Blue;Bed Memory Recall Sock: Without Cue Memory Recall Blue: Without Cue Memory Recall Bed: Without Cue Attention: Focused;Sustained Focused Attention: Appears intact Sustained Attention: Appears intact Awareness: Appears intact Problem Solving: Appears intact Safety/Judgment: Appears intact Sensation Sensation Light Touch: Impaired Detail Light Touch Impaired Details: Impaired RLE;Impaired LLE (N/T from abdomen/belly button to feet) Proprioception: Impaired Detail Proprioception Impaired Details: Impaired RLE;Impaired LLE Coordination Gross Motor Movements are Fluid and Coordinated: No Fine Motor Movements are Fluid and Coordinated: Yes (coordinated for BUEs) Coordination and Movement Description: ataxic BLE Heel  Shin Test: impaired B; ataxic Motor  Motor Motor: Paraplegia;Abnormal tone;Ataxia;Abnormal postural alignment and control Motor - Skilled Clinical Observations: impaired 2/2 T6 incomplete para  Trunk/Postural Assessment  Cervical Assessment Cervical Assessment: Within Functional Limits Thoracic Assessment Thoracic Assessment: Within Functional Limits Lumbar Assessment Lumbar Assessment: Exceptions to [facility] Postural Control Postural Control: Deficits on evaluation Righting Reactions: delayed/insufficient  Balance Balance Balance Assessed: Yes Static Sitting Balance Static Sitting - Balance Support: No upper extremity supported;Feet supported Static Sitting - Level of Assistance: 5: Stand by assistance Dynamic Sitting Balance Dynamic Sitting - Balance Support: No upper extremity supported;Feet supported;During functional activity Dynamic Sitting - Level of Assistance: 5: Stand by assistance Dynamic Sitting - Balance Activities: Lateral lean/weight shifting;Forward lean/weight shifting Static Standing Balance Static Standing - Balance Support: Bilateral upper extremity supported;During functional activity Static Standing - Level  of Assistance: 4: Min assist Dynamic Standing Balance Dynamic Standing - Balance Support: Bilateral upper extremity supported;During functional activity Dynamic Standing - Level of Assistance: 2: Max assist Dynamic Standing - Balance Activities: Lateral lean/weight shifting;Forward lean/weight shifting Extremity/Trunk Assessment RUE Assessment RUE Assessment: Within Functional Limits LUE Assessment LUE Assessment: Within Functional Limits  Care Tool Care Tool Self Care Eating    Independent    Oral Care     Set up    Bathing   Body parts bathed by patient: Right arm;Left arm;Chest;Abdomen;Front perineal area;Buttocks;Right upper leg;Left upper leg;Right lower leg;Left lower leg;Face     Assist Level: Minimal Assistance - Patient > 75%    Upper  Body Dressing(including orthotics)   What is the patient wearing?: Pull over shirt   Assist Level: Set up assist    Lower Body Dressing (excluding footwear)   What is the patient wearing?: Underwear/pull up;Pants Assist for lower body dressing: Minimal Assistance - Patient > 75%    Putting on/Taking off footwear   What is the patient wearing?: Non-skid slipper socks Assist for footwear: Minimal Assistance - Patient > 75%       Care Tool Toileting Toileting activity     Max A     Care Tool Bed Mobility Roll left and right activity   Roll left and right assist level: Contact Guard/Touching assist    Sit to lying activity   Sit to lying assist level: Contact Guard/Touching assist    Lying to sitting edge of bed activity   Lying to sitting edge of bed assist level: Contact Guard/Touching assist     Care Tool Transfers Sit to stand transfer   Sit to stand assist level: Moderate Assistance - Patient 50 - 74%    Chair/bed transfer   Chair/bed transfer assist level: Moderate Assistance - Patient 50 - 74%     Toilet transfer    Mod A     Care Tool Cognition Expression of Ideas and Wants Expression of Ideas and Wants: Without difficulty (complex and basic) - expresses complex messages without difficulty and with speech that is clear and easy to understand   Understanding Verbal and Non-Verbal Content Understanding Verbal and Non-Verbal Content: Understands (complex and basic) - clear comprehension without cues or repetitions   Memory/Recall Ability *first 3 days only      Refer to Care Plan for Long Term Goals  SHORT TERM GOAL WEEK 1 OT Short Term Goal 1 (Week 1): Pt will complete toilet/BSC transfers with LRAD with Mod A consistently OT Short Term Goal 2 (Week 1): Pt will complete sit <> stands with Min A at LRAD in prep for ADL OT Short Term Goal 3 (Week 1): Pt will stand for 1-3 mins with unilateral support to improve LB ADL OT Short Term Goal 4 (Week 1): Pt will  complete LB dressing w/ no more than Min A  Recommendations for other services: None    Skilled Therapeutic Intervention ADL ADL Eating: Not assessed Grooming: Setup Where Assessed-Grooming: Sitting at sink Upper Body Bathing: Setup Lower Body Bathing: Minimal assistance (lateral leans, seated in wheelchair) Where Assessed-Lower Body Bathing: Sitting at sink Upper Body Dressing: Setup Lower Body Dressing: Moderate assistance Toileting: Not assessed Toilet Transfer: Not assessed Mobility  Bed Mobility Bed Mobility: Rolling Right;Rolling Left;Supine to Sit;Sit to Supine Rolling Right: Contact Guard/Touching assist Rolling Left: Contact Guard/Touching assist Supine to Sit: Contact Guard/Touching assist Sit to Supine: Contact Guard/Touching assist Transfers Sit to Stand: Moderate Assistance - Patient 50-74%  Skilled Interventions: Pt greeted at time of session semireclined in bed agreeable to OT session, explained role and purpose of OT. See above and below for eval details.   Bed mobiltiy performed with CGA, sit <> stands with Mod/Max at [facility] with pt noted to bear weight through UEs for support, very ataxic LEs during transfers. Stand pivot bed <> wheelchair with heavy Mod A and extended time to manage and sequence LEs. Bathing tasks at sink level with Set up for UB and Min A for LB, pt utilizing lateral leans but needing assist to thoroughly wash feet and Min A for standing balance at sink pulling pants over hips during LB dressing with Mod A overall. Pt very motivated to participate. Pt back in bed resting with alarm on call bell in reach awaiting PT session. Relayed to nursing recommending [facility].    Discharge Criteria: Patient will be discharged from OT if patient refuses treatment 3 consecutive times without medical reason, if treatment goals not met, if there is a change in medical status, if patient makes no progress towards goals or if patient is discharged from hospital.  The  above assessment, treatment plan, treatment alternatives and goals were discussed and mutually agreed upon: by patient  Viona Gilmore 12/21/2020, 12:57 PM

## 2020-12-21 NOTE — Progress Notes (Signed)
Inpatient Rehabilitation Care Coordinator Assessment and Plan Patient Details  Name: Hilario Robarts MRN: [id number] Date of Birth: [birth year]  Today's Date: 12/21/2020  Hospital Problems: Principal Problem:   Acute incomplete paraplegia [facility]) Active Problems:   Prostate cancer metastatic to bone [facility])  Past Medical History:  Past Medical History:  Diagnosis Date  . Depression    recently went on disability d/t diagnosis  . History of recent fall 01/2018   missed the last step on ladder, causing back discomfort  . Hypertension   . Prostate cancer ([place]) 12/2017   prostate   Past Surgical History:  Past Surgical History:  Procedure Laterality Date  . BACK SURGERY  2001    2 rods and 6 screws in back  . COLONOSCOPY WITH PROPOFOL N/A 09/24/2019   Procedure: COLONOSCOPY WITH PROPOFOL;  Surgeon: Jonathon Bellows, MD;  Location: [facility] ENDOSCOPY;  Service: Gastroenterology;  Laterality: N/A;  . Left shoulder surgery Left 1998   removed a piece of bone around collar bone  . PROSTATE BIOPSY N/A 02/28/2018   Procedure: PROSTATE BIOPSY;  Surgeon: Abbie Sons, MD;  Location: [facility] ORS;  Service: Urology;  Laterality: N/A;  . TRANSRECTAL ULTRASOUND N/A 02/28/2018   Procedure: TRANSRECTAL ULTRASOUND;  Surgeon: Abbie Sons, MD;  Location: [facility] ORS;  Service: Urology;  Laterality: N/A;   Social History:  reports that he quit smoking about 16 months ago. His smoking use included cigars. He smoked 2.00 packs per day. He has never used smokeless tobacco. He reports current alcohol use of about 9.0 standard drinks of alcohol per week. He reports current drug use. Drugs: Cocaine and "Crack" cocaine.  Family / Support Systems Marital Status: Single Patient Roles: Partner Spouse/Significant Other: Mary (6 years) Children: 4 adult children (2 daughters/2 sons) Other Supports: None reported Anticipated Caregiver: Mary and children Ability/Limitations of Caregiver: Reports that Stanton Kidney has short term  memory. Caregiver Availability: 24/7 Family Dynamics: Pt lives with his partner Stanton Kidney.  Social History Preferred language: English Religion: Christian Cultural Background: Pt worked in Electronics engineer until retirement. Jail time majority of life. 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 Released 11 years ago. Pt not on probation. Guardian/Conservator: N/A   Abuse/Neglect Abuse/Neglect Assessment Can Be Completed: Yes Physical Abuse: Denies Verbal Abuse: Denies Sexual Abuse: Denies Exploitation of patient/patient's resources: Denies Self-Neglect: Denies  Emotional Status Pt's affect, behavior and adjustment status: Pt admits to some depression since he is now here in the hospital, otherwise he reports he is doign well. Recent Psychosocial Issues: See above Psychiatric History: Denies any hx Substance Abuse History: Pt admits he quit smoking cigarettes just becuase he was tired of doing it. Pt states he drinks several times during the week.Atleast 2-3 40oz beers. Admits he quit cocaine 5 years ago.  Patient / Family Perceptions, Expectations & Goals Pt/Family understanding of illness & functional limitations: Pt and dtr [name] have general understanding of care needs Premorbid pt/family roles/activities: Independent Anticipated changes in roles/activities/participation: Assistance with some ADLs/IADLs Pt/family expectations/goals: Pt goal is to learn how to walk.  Community Resources Express Scripts: None Premorbid Home Care/DME Agencies: None Transportation available at discharge: Dtr [employer] referrals recommended: Neuropsychology  Discharge Planning Living Arrangements: Spouse/significant other Support Systems: Children,Spouse/significant other Type of Residence: Private  residence Administrator, sports: Multimedia programmer (specify),Medicaid (specify county) ([place] Medicaid) Financial Resources: Radio broadcast assistant Screen Referred: No  Living Expenses: Mortgage Money Management: Patient,Spouse Does the patient have any problems obtaining your medications?: No Home Management: Both pt and partner manage homecare needs Patient/Family Preliminary Plans: TBD Care Coordinator Barriers to Discharge: Other (comments) Care Coordinator Barriers to Discharge Comments: reports are that partner Stanton Kidney has STM issues Care Coordinator Anticipated Follow Up Needs: HH/OP  Clinical Impression SW met with pt in room to introduce self, explain role, and discuss discharge process. Pt HCPOA is his dtr [name]. 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 No DME. Pt aware SW to follow-up with his dtr.   SW spoke with pt dtr [name] to introduce self. Reports she is getting ready to be released from hospital due to being admitted yesterday due to high BP. SW informed on changes to pt radiation treatment schedule. SW informed will follow-up.   Charlett Merkle A Bilal Manzer 12/21/2020, 2:25 PM

## 2020-12-21 NOTE — Plan of Care (Signed)
  Problem: RH Balance Goal: LTG Patient will maintain dynamic standing with ADLs (OT) Description: LTG:  Patient will maintain dynamic standing balance with assist during activities of daily living (OT)  Flowsheets (Taken 12/21/2020 1657) LTG: Pt will maintain dynamic standing balance during ADLs with: Supervision/Verbal cueing   Problem: Sit to Stand Goal: LTG:  Patient will perform sit to stand in prep for activites of daily living with assistance level (OT) Description: LTG:  Patient will perform sit to stand in prep for activites of daily living with assistance level (OT) Flowsheets (Taken 12/21/2020 1657) LTG: PT will perform sit to stand in prep for activites of daily living with assistance level: Supervision/Verbal cueing   Problem: RH Grooming Goal: LTG Patient will perform grooming w/assist,cues/equip (OT) Description: LTG: Patient will perform grooming with assist, with/without cues using equipment (OT) Flowsheets (Taken 12/21/2020 1657) LTG: Pt will perform grooming with assistance level of: Set up assist    Problem: RH Bathing Goal: LTG Patient will bathe all body parts with assist levels (OT) Description: LTG: Patient will bathe all body parts with assist levels (OT) Flowsheets (Taken 12/21/2020 1657) LTG: Pt will perform bathing with assistance level/cueing: Supervision/Verbal cueing   Problem: RH Dressing Goal: LTG Patient will perform upper body dressing (OT) Description: LTG Patient will perform upper body dressing with assist, with/without cues (OT). Flowsheets (Taken 12/21/2020 1657) LTG: Pt will perform upper body dressing with assistance level of: Set up assist Goal: LTG Patient will perform lower body dressing w/assist (OT) Description: LTG: Patient will perform lower body dressing with assist, with/without cues in positioning using equipment (OT) Flowsheets (Taken 12/21/2020 1657) LTG: Pt will perform lower body dressing with assistance level of: Supervision/Verbal cueing    Problem: RH Toileting Goal: LTG Patient will perform toileting task (3/3 steps) with assistance level (OT) Description: LTG: Patient will perform toileting task (3/3 steps) with assistance level (OT)  Flowsheets (Taken 12/21/2020 1657) LTG: Pt will perform toileting task (3/3 steps) with assistance level: Supervision/Verbal cueing   Problem: RH Toilet Transfers Goal: LTG Patient will perform toilet transfers w/assist (OT) Description: LTG: Patient will perform toilet transfers with assist, with/without cues using equipment (OT) Flowsheets (Taken 12/21/2020 1657) LTG: Pt will perform toilet transfers with assistance level of: Supervision/Verbal cueing   Problem: RH Tub/Shower Transfers Goal: LTG Patient will perform tub/shower transfers w/assist (OT) Description: LTG: Patient will perform tub/shower transfers with assist, with/without cues using equipment (OT) Flowsheets (Taken 12/21/2020 1657) LTG: Pt will perform tub/shower stall transfers with assistance level of: Supervision/Verbal cueing

## 2020-12-21 NOTE — Progress Notes (Signed)
Patient ID: Terry Mitchell, male   DOB: 08/20/1950, 70 y.o.   MRN: [id number]  SW scheduled [facility] transportation for the following radiation dates and time:   6/1 3:45pm; pick up at 2:30pm 6/2 2:30pm; pick up at 1:45pm 6/3 3:45pm; pick up at 2:30pm   SW informed pt on above new schedule.    Loralee Pacas, MSW, [place] Office: [phone] Cell: [phone] Fax: [phone]

## 2020-12-21 NOTE — Progress Notes (Signed)
Orthopedic Tech Progress Note Patient Details:  Terry Mitchell 1950-11-18 [id number] Called in order to [facility] for a PRAFO Patient ID: Avan Gullett, male   DOB: 12/18/1950, 70 y.o.   MRN: [id number]   Janit Pagan 12/21/2020, 11:00 AM

## 2020-12-21 NOTE — Evaluation (Signed)
Physical Therapy Assessment and Plan  Patient Details  Name: Terry Mitchell MRN: [id number] Date of Birth: [birth year]  PT Diagnosis: Abnormal posture, Abnormality of gait, Ataxia, Ataxic gait, Difficulty walking, Impaired sensation, Muscle weakness and Paraplegia Rehab Potential: Good ELOS: 21-24 days   Today's Date: 12/21/2020 PT Individual Time: 0900-1010 PT Individual Time Calculation (min): 70 min    Hospital Problem: Principal Problem:   Acute incomplete paraplegia ([place]) Active Problems:   Prostate cancer metastatic to bone [facility])   Past Medical History:  Past Medical History:  Diagnosis Date  . Depression    recently went on disability d/t diagnosis  . History of recent fall 01/2018   missed the last step on ladder, causing back discomfort  . Hypertension   . Prostate cancer ([place]) 12/2017   prostate   Past Surgical History:  Past Surgical History:  Procedure Laterality Date  . BACK SURGERY  2001    2 rods and 6 screws in back  . COLONOSCOPY WITH PROPOFOL N/A 09/24/2019   Procedure: COLONOSCOPY WITH PROPOFOL;  Surgeon: Jonathon Bellows, MD;  Location: [facility] ENDOSCOPY;  Service: Gastroenterology;  Laterality: N/A;  . Left shoulder surgery Left 1998   removed a piece of bone around collar bone  . PROSTATE BIOPSY N/A 02/28/2018   Procedure: PROSTATE BIOPSY;  Surgeon: Abbie Sons, MD;  Location: [facility] ORS;  Service: Urology;  Laterality: N/A;  . TRANSRECTAL ULTRASOUND N/A 02/28/2018   Procedure: TRANSRECTAL ULTRASOUND;  Surgeon: Abbie Sons, MD;  Location: [facility] ORS;  Service: Urology;  Laterality: N/A;    Assessment & Plan Clinical Impression:  Terry Mitchell is a 70 year old male with history of depression, HTN, prostate canceroff treatment/followed by hospice;who was admitted on 12/11/20 with reports of back pain for 3-4 months with BLE weakness and started developing progressive numbness from abdomen down with inability to walk. He was found to have circumferential  tumor at T7/T7 with compression of spinal cord and diffuse osseous metastatic disease without pathologic Fx and largest lesion at T12. Dr. Cari Caraway recommended high dose steroids as well as hem/onc/radiation input. He was evaluated by Dr. Lisbeth Renshaw and started on palliative XRT immediately. Dr. Irene Limbo consulted for input -->CT abdomen/pelvis/chest done for staging and showed enlargening bilateral rib lesions, No PE and abdomen/pelvis without significant abnormality.   Patient revoked hospice services and elected on full scope of care with palliative systemic therapies as well as therapy to regain prior functional status. . Steroids to be slowly weaned off and he is to follow up with Dr. Tasia Catchings on outpatient basis. Therapy ongoing and patient continues to be limited by BLE weakness, multiple falls as well as balance deficits. CIR was consulted for progressive therapies. Patient transferred to CIR on 12/20/2020 .   Patient currently requires mod to max A with mobility secondary to muscle weakness, decreased cardiorespiratoy endurance, abnormal tone, unbalanced muscle activation, ataxia and decreased coordination and decreased sitting balance, decreased standing balance, decreased postural control and decreased balance strategies.  Prior to hospitalization, patient was independent  with mobility and lived with Significant other in a House home.  Home access is 2Stairs to enter.  Patient will benefit from skilled PT intervention to maximize safe functional mobility, minimize fall risk and decrease caregiver burden for planned discharge home with 24 hour assist.  Anticipate patient will benefit from follow up [facility] at discharge.  PT - End of Session Activity Tolerance: Tolerates 30+ min activity with multiple rests Endurance Deficit: Yes Endurance Deficit Description: frequent rest  breaks during functional activity PT Assessment Rehab Potential (ACUTE/IP ONLY): Good PT Barriers to Discharge: Inaccessible home  environment;Home environment access/layout;Pending chemo/radiation PT Patient demonstrates impairments in the following area(s): Balance;Endurance;Motor;Safety;Sensory PT Transfers Functional Problem(s): Bed Mobility;Bed to Chair;Car;Furniture;Floor PT Locomotion Functional Problem(s): Ambulation;Wheelchair Mobility;Stairs PT Plan PT Intensity: Minimum of 1-2 x/day ,45 to 90 minutes PT Frequency: 5 out of 7 days PT Duration Estimated Length of Stay: 21-24 days PT Treatment/Interventions: Ambulation/gait training;Balance/vestibular training;Community reintegration;Discharge planning;Disease management/prevention;DME/adaptive equipment instruction;Functional mobility training;Neuromuscular re-education;Pain management;Patient/family education;Psychosocial support;Splinting/orthotics;Stair training;Therapeutic Activities;Therapeutic Exercise;UE/LE Strength taining/ROM;UE/LE Coordination activities;Wheelchair propulsion/positioning PT Transfers Anticipated Outcome(s): Supervision PT Locomotion Anticipated Outcome(s): Supervision with LRAD PT Recommendation Recommendations for Other Services: Neuropsych consult;Therapeutic Recreation consult Therapeutic Recreation Interventions: Stress management Follow Up Recommendations: Home health PT;24 hour supervision/assistance Patient destination: Home Equipment Recommended: Rolling walker with 5" wheels;Wheelchair (measurements);Wheelchair cushion (measurements) Equipment Details: 18x18 w/c   PT Evaluation Precautions/Restrictions Precautions Precautions: Fall Precaution Comments: ataxic LEs, fall risk Restrictions Weight Bearing Restrictions: No Pain Pain Assessment Pain Scale: 0-10 Pain Score: 0-No pain Home Living/Prior Functioning Home Living Available Help at Discharge: Family;Available 24 hours/day Type of Home: House Home Access: Stairs to enter [employer] of Steps: 2 Entrance Stairs-Rails: None Home Layout: Two  level;Bed/bath upstairs Alternate Level Stairs-Number of Steps: flight Alternate Level Stairs-Rails: Right Additional Comments: wife has an LVAD,  pt also reports wife has some early dementia/STM deficits; reports adult daughter can provide assist  Lives With: Significant other Prior Function Level of Independence: Independent with gait;Independent with transfers  Able to Take Stairs?: Yes Driving: Yes Vocation: Retired Leisure: Hobbies-yes (Comment) Comments: enjoys riding motorcycle Vision/Perception  Perception Perception: Within Functional Limits Praxis Praxis: Intact  Cognition Overall Cognitive Status: Within Functional Limits for tasks assessed Arousal/Alertness: Awake/alert Orientation Level: Oriented X4 Attention: Focused;Sustained Focused Attention: Appears intact Sustained Attention: Appears intact Memory: Appears intact Awareness: Appears intact Problem Solving: Appears intact Safety/Judgment: Appears intact Sensation Sensation Light Touch: Impaired Detail Light Touch Impaired Details: Impaired RLE;Impaired LLE (N/T from bellybutton down) Proprioception: Impaired Detail Proprioception Impaired Details: Impaired RLE;Impaired LLE Coordination Gross Motor Movements are Fluid and Coordinated: No Fine Motor Movements are Fluid and Coordinated: No Coordination and Movement Description: ataxic BLE Heel Shin Test: impaired B; ataxic Motor  Motor Motor: Paraplegia;Abnormal tone;Ataxia;Abnormal postural alignment and control Motor - Skilled Clinical Observations: impaired 2/2 T6 incomplete para  Trunk/Postural Assessment  Cervical Assessment Cervical Assessment: Within Functional Limits Thoracic Assessment Thoracic Assessment: Within Functional Limits Lumbar Assessment Lumbar Assessment: Exceptions to [facility] (posterior pelvic tilt) Postural Control Postural Control: Deficits on evaluation Righting Reactions: delayed/insufficient  Balance Balance Balance Assessed:  Yes Static Sitting Balance Static Sitting - Balance Support: No upper extremity supported;Feet supported Static Sitting - Level of Assistance: 5: Stand by assistance Dynamic Sitting Balance Dynamic Sitting - Balance Support: No upper extremity supported;Feet supported;During functional activity Dynamic Sitting - Level of Assistance: 5: Stand by assistance Static Standing Balance Static Standing - Balance Support: Bilateral upper extremity supported;During functional activity Static Standing - Level of Assistance: 4: Min assist Dynamic Standing Balance Dynamic Standing - Balance Support: Bilateral upper extremity supported;During functional activity Dynamic Standing - Level of Assistance: 2: Max assist Extremity Assessment   RLE Assessment RLE Assessment: Exceptions to [facility] General Strength Comments: impaired, see below RLE Strength RLE Overall Strength: Deficits Right Hip Flexion: 3+/5 Right Knee Flexion: 3+/5 Right Knee Extension: 4/5 Right Ankle Dorsiflexion: 4/5 LLE Assessment LLE Assessment: Exceptions to [facility] General Strength Comments: impaired, see below LLE Strength LLE Overall Strength: Deficits Left Hip  Flexion: 3+/5 Left Knee Flexion: 3/5 Left Knee Extension: 4-/5 Left Ankle Dorsiflexion: 2+/5  Care Tool Care Tool Bed Mobility Roll left and right activity   Roll left and right assist level: Contact Guard/Touching assist    Sit to lying activity   Sit to lying assist level: Contact Guard/Touching assist    Lying to sitting edge of bed activity   Lying to sitting edge of bed assist level: Contact Guard/Touching assist     Care Tool Transfers Sit to stand transfer   Sit to stand assist level: Moderate Assistance - Patient 50 - 74%    Chair/bed transfer   Chair/bed transfer assist level: Moderate Assistance - Patient 50 - 74%     Toilet transfer   Assist Level:  (not assessed at time of eval)    Car transfer   Car transfer assist level: Moderate Assistance  - Patient 50 - 74%      Care Tool Locomotion Ambulation   Assist level: 2 helpers Assistive device: Parallel bars Max distance: 10'  Walk 10 feet activity   Assist level: 2 helpers Assistive device: Parallel bars   Walk 50 feet with 2 turns activity Walk 50 feet with 2 turns activity did not occur: Safety/medical concerns      Walk 150 feet activity Walk 150 feet activity did not occur: Safety/medical concerns      Walk 10 feet on uneven surfaces activity Walk 10 feet on uneven surfaces activity did not occur: Safety/medical concerns      Stairs Stair activity did not occur: Safety/medical concerns        Walk up/down 1 step activity Walk up/down 1 step or curb (drop down) activity did not occur: Safety/medical concerns     Walk up/down 4 steps activity did not occuR: Safety/medical concerns  Walk up/down 4 steps activity      Walk up/down 12 steps activity Walk up/down 12 steps activity did not occur: Safety/medical concerns      Pick up small objects from floor Pick up small object from the floor (from standing position) activity did not occur: Safety/medical concerns      Wheelchair Will patient use wheelchair at discharge?: Yes Type of Wheelchair: Manual   Wheelchair assist level: Supervision/Verbal cueing Max wheelchair distance: 150'  Wheel 50 feet with 2 turns activity   Assist Level: Supervision/Verbal cueing  Wheel 150 feet activity   Assist Level: Supervision/Verbal cueing    Refer to Care Plan for Long Term Goals  SHORT TERM GOAL WEEK 1 PT Short Term Goal 1 (Week 1): Pt will perform least restrictive transfer with min A consistently PT Short Term Goal 2 (Week 1): Pt will initiate gait training outside of // bars with LRAD PT Short Term Goal 3 (Week 1): Pt will initiate stair training as safe and able  Recommendations for other services: Neuropsych and Therapeutic Recreation  Stress management  Skilled Therapeutic Intervention Evaluation completed  (see details above and below) with education on PT POC and goals and individual treatment initiated with focus on functional transfer and gait assessment as well as setting pt up with appropriate equipment to be utilized during rehab stay. Also reviewed rehab schedule, POC, etc. Pt received supine in bed, agreeable to PT eval. No complaints of pain. Supine to sit with CGA for steadying. Sit to stand with mod A to RW. Stand pivot transfer to w/c with RW and max A, pt requires increased time to sequence taking steps with LE and unable to move RW  due to heavy reliance on BUE support on RW. Demonstrated use of stedy and pt able to stand to stedy with mod A for increased safety with transfers with nursing. Squat pivot transfer w/c to/from mat table with mod A progressing to min A. Provided 18x18 w/c with cushion for improved body mechanics with w/c propulsion. Manual w/c propulsion x 150 ft with use of BUE at Supervision level with cues for technique. Sit to stand in // bars with mod A. Standing alt L/R marches with BUE support and mod A for balance. ambulation 2 x 10 ft in // bars with min to mod A and close w/c follow for safety. Pt exhibits intermittent RUE buckling during gait, narrow BOS, and heavy UE reliance. Standing alt L/R lifts with RW and mod A for balance, impaired ability to lift RLE as compared to LLE, 3 x 10 reps to fatigue. Pt also exhibits poor control of LE placement. Pt requests to sit up in recliner at end of session. Stand pivot transfer w/c to recliner with RW and max A. Pt left seated in recliner in room with needs in reach, chair alarm in place.  Mobility Bed Mobility Bed Mobility: Rolling Right;Rolling Left;Supine to Sit;Sit to Supine Rolling Right: Contact Guard/Touching assist Rolling Left: Contact Guard/Touching assist Supine to Sit: Contact Guard/Touching assist Sit to Supine: Contact Guard/Touching assist Transfers Transfers: Sit to Stand;Stand Pivot Transfers;Squat Pivot  Transfers;Transfer via Lift Equipment Sit to Stand: Moderate Assistance - Patient 50-74% Stand Pivot Transfers: Maximal Assistance - Patient 25 - 49% Stand Pivot Transfer Details: Verbal cues for sequencing;Verbal cues for technique;Verbal cues for precautions/safety;Verbal cues for safe use of DME/AE;Manual facilitation for weight shifting Squat Pivot Transfers: Moderate Assistance - Patient 50-74% Transfer (Assistive device): Comptroller via Lift Equipment: Microbiologist (Feet): 10 Feet Assistive device: Rolling walker Gait Gait Pattern: Impaired (R knee buckles; narrow BOS, ataxic, steppage) Gait velocity: decreased Stairs / Additional Locomotion Stairs: No Architect: Yes Wheelchair Assistance: Chartered loss adjuster: Both upper extremities Wheelchair Parts Management: Needs assistance Distance: 150   Discharge Criteria: Patient will be discharged from PT if patient refuses treatment 3 consecutive times without medical reason, if treatment goals not met, if there is a change in medical status, if patient makes no progress towards goals or if patient is discharged from hospital.  The above assessment, treatment plan, treatment alternatives and goals were discussed and mutually agreed upon: by patient   Excell Seltzer, PT, DPT, CSRS 12/21/2020, 12:19 PM

## 2020-12-21 NOTE — Progress Notes (Signed)
Physical Therapy Session Note  Patient Details  Name: Terry Mitchell MRN: [id number] Date of Birth: May 10, 1951  Today's Date: 12/21/2020 PT Individual Time: 1115-1200 PT Individual Time Calculation (min): 45 min   Short Term Goals: Week 1:  PT Short Term Goal 1 (Week 1): Pt will perform least restrictive transfer with min A consistently PT Short Term Goal 2 (Week 1): Pt will initiate gait training outside of // bars with LRAD PT Short Term Goal 3 (Week 1): Pt will initiate stair training as safe and able  Skilled Therapeutic Interventions/Progress Updates:  Pt received seated in recliner in room, agreeable to PT session. No complaints of pain. Squat pivot transfer to w/c with mod A. Manual w/c propulsion x 100 ft with use of BUE at Supervision level. Reviewed management of w/c parts including donning/doffing leg rests and management of arm rests for setup for squat pivot transfers. Squat pivot transfer w/c to/from mat table with min A. Sit to stand with mod A to RW. Standing mini-squats 3 x 10 reps with RW and mod A for balance. Attempt stand pivot transfer to car, pt unable to safely lift LE during transfer. Squat pivot transfer w/c to/from car with mod A, some assist needed to bring LE in/out of car. Pt requests to return to recliner at end of session. Squat pivot transfer to recliner with mod A. Pt left seated in recliner in room with needs in reach at end of session.  Therapy Documentation Precautions:  Precautions Precautions: Fall Precaution Comments: ataxic LEs, fall risk Restrictions Weight Bearing Restrictions: No   Therapy/Group: Individual Therapy   Excell Seltzer, PT, DPT, CSRS  12/21/2020, 12:20 PM

## 2020-12-22 ENCOUNTER — Ambulatory Visit: Payer: Medicare HMO

## 2020-12-22 DIAGNOSIS — C7951 Secondary malignant neoplasm of bone: Secondary | ICD-10-CM | POA: Diagnosis not present

## 2020-12-22 DIAGNOSIS — M21372 Foot drop, left foot: Secondary | ICD-10-CM | POA: Diagnosis not present

## 2020-12-22 DIAGNOSIS — G8222 Paraplegia, incomplete: Secondary | ICD-10-CM | POA: Diagnosis not present

## 2020-12-22 DIAGNOSIS — C61 Malignant neoplasm of prostate: Secondary | ICD-10-CM | POA: Diagnosis not present

## 2020-12-22 MED ORDER — SODIUM ZIRCONIUM CYCLOSILICATE 10 G PO PACK
10.0000 g | PACK | Freq: Once | ORAL | Status: AC
  Administered 2020-12-22: 10 g via ORAL
  Filled 2020-12-22: qty 1

## 2020-12-22 MED ORDER — LOSARTAN POTASSIUM 50 MG PO TABS
25.0000 mg | ORAL_TABLET | Freq: Every day | ORAL | Status: DC
  Administered 2020-12-22 – 2021-01-03 (×13): 25 mg via ORAL
  Filled 2020-12-22 (×13): qty 1

## 2020-12-22 NOTE — Progress Notes (Signed)
Occupational Therapy Session Note  Patient Details  Name: Kallon Caylor MRN: [id number] Date of Birth: 04-Mar-1951  Today's Date: 12/22/2020 OT Individual Time: 2867-5198 OT Individual Time Calculation (min): 39 min    Short Term Goals: Week 1:  OT Short Term Goal 1 (Week 1): Pt will complete toilet/BSC transfers with LRAD with Mod A consistently OT Short Term Goal 2 (Week 1): Pt will complete sit <> stands with Min A at LRAD in prep for ADL OT Short Term Goal 3 (Week 1): Pt will stand for 1-3 mins with unilateral support to improve LB ADL OT Short Term Goal 4 (Week 1): Pt will complete LB dressing w/ no more than Min A   Skilled Therapeutic Interventions/Progress Updates:    Pt greeted at time of session sitting up in wheelchair agreeable to OT session. Pt able to recall what he already worked on in AM sessions with OT/PT. Self propel room <> gym with Supervision with BUEs, set up at rebounder and 2x20 for the following: toss, toss + overhead initially with 1kg and upgraded to 2kg ball. Sit <> stand at [facility] with Mod fading to almost Min A for power up, Min A for standing balance thorughout activity, able to stand for 3 rounds of 2-3 minute intervals alternating UE support to hit objects for LE strengthening and standing balance. No fatigue reported. Improved ability to control descent into chair. Set up back in room with call bell in reach all needs met.   Therapy Documentation Precautions:  Precautions Precautions: Fall Precaution Comments: ataxic LEs, fall risk Restrictions Weight Bearing Restrictions: No     Therapy/Group: Individual Therapy  Viona Gilmore 12/22/2020, 7:18 AM

## 2020-12-22 NOTE — Care Management (Signed)
[place] Individual Statement of Services  Patient Name:  Terry Mitchell  Date:  12/22/2020  Welcome to the [facility].  Our goal is to provide you with an individualized program based on your diagnosis and situation, designed to meet your specific needs.  With this comprehensive rehabilitation program, you will be expected to participate in at least 3 hours of rehabilitation therapies Monday-Friday, with modified therapy programming on the weekends.  Your rehabilitation program will include the following services:  Physical Therapy (PT), Occupational Therapy (OT), 24 hour per day rehabilitation nursing, Therapeutic Recreaction (TR), Psychology, Neuropsychology, Care Coordinator, Rehabilitation Medicine, Nutrition Services, Pharmacy Services and Other  Weekly team conferences will be held on Tuesdays to discuss your progress.  Your Inpatient Rehabilitation Care Coordinator will talk with you frequently to get your input and to update you on team discussions.  Team conferences with you and your family in attendance may also be held.  Expected length of stay: 21-24 days   Overall anticipated outcome: Supervision  Depending on your progress and recovery, your program may change. Your Inpatient Rehabilitation Care Coordinator will coordinate services and will keep you informed of any changes. Your Inpatient Rehabilitation Care Coordinator's name and contact numbers are listed  below.  The following services may also be recommended but are not provided by the [facility] will be made to provide these services after discharge if needed.  Arrangements include referral to agencies that provide these services.  Your insurance has been verified to be:  [employer]  Your primary doctor is:   Beverlyn Roux  Pertinent information will be shared with your doctor and your insurance company.  Inpatient Rehabilitation Care Coordinator:  Cathleen Corti [phone] or ([phone]  Information discussed with and copy given to patient by: Rana Snare, 12/22/2020, 9:50 AM

## 2020-12-22 NOTE — Progress Notes (Signed)
Occupational Therapy Session Note  Patient Details  Name: Terry Mitchell MRN: [id number] Date of Birth: [birth year]  Today's Date: 12/22/2020 OT Individual Time: 1030-1100 OT Individual Time Calculation (min): 30 min    Short Term Goals: Week 1:  OT Short Term Goal 1 (Week 1): Pt will complete toilet/BSC transfers with LRAD with Mod A consistently OT Short Term Goal 2 (Week 1): Pt will complete sit <> stands with Min A at LRAD in prep for ADL OT Short Term Goal 3 (Week 1): Pt will stand for 1-3 mins with unilateral support to improve LB ADL OT Short Term Goal 4 (Week 1): Pt will complete LB dressing w/ no more than Min A  Skilled Therapeutic Interventions/Progress Updates:    Pt received in wc ready for therapy. Pt transported to gym to focus on LE coordination and core strength. Used RW with mod A to step to mat with cues for foot placement as pt has decreased sensation.   On mat worked on AROM exercises of LEs integrating coordination with sequenced leg patterns including quick crosses at the ankles.   Pt tends to lean back in sitting (stated he has decreased sensation through trunk) so worked on postural control with arm AROM exercises of punches.  Completed sq pivot back to wc with min A.  Self propelled back to room and resting in chair as his next OT session in 15 min.   Therapy Documentation Precautions:  Precautions Precautions: Fall Precaution Comments: ataxic LEs, fall risk Restrictions Weight Bearing Restrictions: No    Vital Signs: Therapy Vitals Temp: 97.8 F (36.6 C) Temp Source: Oral Pulse Rate: (!) 53 Resp: 16 BP: (!) 151/89 Patient Position (if appropriate): Lying Oxygen Therapy SpO2: 99 % O2 Device: Room Air Pain: Pain Assessment Pain Scale: 0-10 Pain Score: 0-No pain ADL: ADL Eating: Not assessed Grooming: Setup Where Assessed-Grooming: Sitting at sink Upper Body Bathing: Setup Lower Body Bathing: Minimal assistance (lateral leans, seated in  wheelchair) Where Assessed-Lower Body Bathing: Sitting at sink Upper Body Dressing: Setup Lower Body Dressing: Moderate assistance Toileting: Not assessed Toilet Transfer: Not assessed   Therapy/Group: Individual Therapy  [place] 12/22/2020, 8:31 AM

## 2020-12-22 NOTE — Progress Notes (Signed)
Patient ID: Terry Mitchell, male   DOB: 09-13-1950, 70 y.o.   MRN: [id number]  SW informed from [facility] pt radiation treatment cancelled for today due to a delay in [facility] transportation in which they informed her they would be late. States she will follow-up once there is an update for him to have treatment rescheduled for Monday now.   *Treatment now rescheduled for Monday 6/6 at 4:45pm. Pick up scheduled with [facility] [phone]) for 4:15pm.   Loralee Pacas, MSW, [place] Office: [phone] Cell: [phone] Fax: [phone]

## 2020-12-22 NOTE — Progress Notes (Signed)
Occupational Therapy Session Note  Patient Details  Name: Terry Mitchell MRN: [id number] Date of Birth: 1950-09-09  Today's Date: 12/22/2020 OT Individual Time: 7124-5809 OT Individual Time Calculation (min): 54 min    Short Term Goals: Week 1:  OT Short Term Goal 1 (Week 1): Pt will complete toilet/BSC transfers with LRAD with Mod A consistently OT Short Term Goal 2 (Week 1): Pt will complete sit <> stands with Min A at LRAD in prep for ADL OT Short Term Goal 3 (Week 1): Pt will stand for 1-3 mins with unilateral support to improve LB ADL OT Short Term Goal 4 (Week 1): Pt will complete LB dressing w/ no more than Min A  Skilled Therapeutic Interventions/Progress Updates:    Pt in wheelchair to start session, requesting to shower.  He was able to complete sit to stand with the RW from the wheelchair with min instructional cueing for hand placement, with mod assist for pivot transfer to the tub seat.  Decreased ability to efficiently move either LE for transfer was noted.  He was able to complete all bathing in sitting with supervision, using lateral leans for washing his buttocks.  Once dried, he completed stand pivot transfer back to the wheelchair with increased difficulty advancing the RLE compared to the left.  Mod instructional cueing was needed to keep the walker turning with him and to stay inside of it.  He was able to work on dressing with min assist sit to stand from the sink.  Increased trunk flexion noted in standing, but able to correct with cueing.  Decreased ability to maintain the extension when letting go with one UE to pull up LB clothing.  He was able to donn his socks and shoes bending down to his feet with supervision.  Finished session with pt using his LEs in the wheelchair to take himself over to the closet to put up his clean clothing that was not used.  Call button and phone in reach with safety alarm belt in place.    Therapy Documentation Precautions:   Precautions Precautions: Fall Precaution Comments: ataxic LEs, fall risk Restrictions Weight Bearing Restrictions: No  Pain: Pain Assessment Pain Scale: 0-10 Pain Score: 0-No pain ADL: See Care Tool Section for some details of mobility and selfcare  Therapy/Group: Individual Therapy  Juancarlos Crescenzo OTR/L 12/22/2020, 10:00 AM

## 2020-12-22 NOTE — Progress Notes (Signed)
Physical Therapy Session Note  Patient Details  Name: Terry Mitchell MRN: [id number] Date of Birth: 18-Apr-1951  Today's Date: 12/22/2020 PT Individual Time: 0800-0900 PT Individual Time Calculation (min): 60 min   Short Term Goals: Week 1:  PT Short Term Goal 1 (Week 1): Pt will perform least restrictive transfer with min A consistently PT Short Term Goal 2 (Week 1): Pt will initiate gait training outside of // bars with LRAD PT Short Term Goal 3 (Week 1): Pt will initiate stair training as safe and able  Skilled Therapeutic Interventions/Progress Updates:    Pt received seated in bed, agreeable to PT session. No complaints of pain. Seated in bed to sitting EOB with Supervision, use of bedrail. Pt is min A to don shoes while seated EOB. Squat pivot transfer to w/c with min A. Pt able to perform oral hygiene while seated in w/c at sink. Manual w/c propulsion 2 x 100 ft with use of BUE at Supervision level. With cues pt able to remove w/c legrests and set w/c up for transfer to mat table. Squat pivot transfer w/c to mat with min A. Sit to stand initially with min A to RW, increasing to mod A with onset of fatigue. Standing alt LE lifts progressing to 4" step taps then to alt bean bag taps with use of mirror for visual feedback. Focus on LE control due to ongoing ataxia and difficulty with placement of LE. Ambulation x 17 ft with RW and mod A for balance, close w/c follow from a 2nd person for safety. Pt exhibits narrow BOS, ataxic LE, and L ankle inversion during gait. Pt requires cues to adjust LE placement when stepping for improved safety and balance. nustep level 5 x 10 min with use of BLE only for strengthening, cues for decreased L hip ER. Pt returned to room at end of session, left seated in w/c for OT session next with needs in reach  Therapy Documentation Precautions:  Precautions Precautions: Fall Precaution Comments: ataxic LEs, fall risk Restrictions Weight Bearing Restrictions:  No   Therapy/Group: Individual Therapy   Excell Seltzer, PT, DPT, CSRS  12/22/2020, 12:04 PM

## 2020-12-22 NOTE — Progress Notes (Signed)
PROGRESS NOTE   Subjective/Complaints:  Pt reports doing well- isn't taking pain meds except scheduled meds- hasn't taken PRN oxycodone since got here to [facility]. Pain is 4-4.5/10- but tolerable with current regimen.  Finishes radiation tomorrow.   LBM this AM- was small, but felt better- used steady to get to bathroom o/n.  Liked how it went.   K+ 5.6- came back after I wrote his note yesterday- no hemolysis seen- will give Lokelma x1.      ROS:  Pt denies SOB, abd pain, CP, N/V/C/D, and vision changes   Objective:   No results found. Recent Labs    12/21/20 0511  WBC 5.5  HGB 12.2*  HCT 35.8*  PLT 133*   Recent Labs    12/21/20 0511  NA 131*  K 5.6*  CL 95*  CO2 28  GLUCOSE 116*  BUN 31*  CREATININE 1.24  CALCIUM 8.8*    Intake/Output Summary (Last 24 hours) at 12/22/2020 0843 Last data filed at 12/22/2020 0700 Gross per 24 hour  Intake 600 ml  Output --  Net 600 ml        Physical Exam: Vital Signs Blood pressure (!) 151/89, pulse (!) 53, temperature 97.8 F (36.6 C), temperature source Oral, resp. rate 16, weight 88 kg, SpO2 99 %.     General: awake, alert, appropriate, sitting up in bed; taking meds from nurse;  NAD HENT: conjugate gaze; oropharynx moist CV: regular but bradycardic rate- in 50s; no JVD Pulmonary: CTA B/L; no W/R/R- good air movement GI: soft, NT, ND, (+)BS Psychiatric: appropriate Neurological: alert;' no change in sensory level- still a T6 on exam Musculoskeletal:  General: No swelling.  Cervical back: Normal range of motionand neck supple.  Comments: Pt's strength in UEs 5/5 B/L in biceps, triceps, WE, grip and finger abd B/L LEs: RLE- HF 4-/5, KE 4-/5, DF 4+/5, PF 4-/5 LLE: HF 3/5, KE 4-/5, DF 3+/5, and PF 4-/5 Skin: General: Skin is warmand dry.  Comments: Has IV L hand- looks OK Skin looks great except has radiation mark as well as fungal  toenails/very thickened  Neurological:  Mental Status: He is alertand oriented to person, place, and time.  Sensory: Sensory deficitpresent.  Motor: Lowry Bowl.  Comments: Speech clear. Sensory deficits from waist down with paraplegia.  Sensation decreased a lot from T7 on down to S5 B/L    Assessment/Plan: 1. Functional deficits which require 3+ hours per day of interdisciplinary therapy in a comprehensive inpatient rehab setting.  Physiatrist is providing close team supervision and 24 hour management of active medical problems listed below.  Physiatrist and rehab team continue to assess barriers to discharge/monitor patient progress toward functional and medical goals  Care Tool:  Bathing    Body parts bathed by patient: Right arm,Left arm,Chest,Abdomen,Front perineal area,Buttocks,Right upper leg,Left upper leg,Right lower leg,Left lower leg,Face         Bathing assist Assist Level: Minimal Assistance - Patient > 75%     Upper Body Dressing/Undressing Upper body dressing   What is the patient wearing?: Pull over shirt    Upper body assist Assist Level: Set up assist    Lower Body Dressing/Undressing Lower body  dressing      What is the patient wearing?: Underwear/pull up,Pants     Lower body assist Assist for lower body dressing: Minimal Assistance - Patient > 75%     Toileting Toileting    Toileting assist Assist for toileting: Minimal Assistance - Patient > 75%     Transfers Chair/bed transfer  Transfers assist     Chair/bed transfer assist level: Moderate Assistance - Patient 50 - 74%     Locomotion Ambulation   Ambulation assist      Assist level: 2 helpers Assistive device: Parallel bars Max distance: 10'   Walk 10 feet activity   Assist     Assist level: 2 helpers Assistive device: Parallel bars   Walk 50 feet activity   Assist Walk 50 feet with 2 turns activity did not occur: Safety/medical  concerns         Walk 150 feet activity   Assist Walk 150 feet activity did not occur: Safety/medical concerns         Walk 10 feet on uneven surface  activity   Assist Walk 10 feet on uneven surfaces activity did not occur: Safety/medical concerns         Wheelchair     Assist Will patient use wheelchair at discharge?: Yes Type of Wheelchair: Manual    Wheelchair assist level: Supervision/Verbal cueing Max wheelchair distance: 150'    Wheelchair 50 feet with 2 turns activity    Assist        Assist Level: Supervision/Verbal cueing   Wheelchair 150 feet activity     Assist      Assist Level: Supervision/Verbal cueing   Blood pressure (!) 151/89, pulse (!) 53, temperature 97.8 F (36.6 C), temperature source Oral, resp. rate 16, weight 88 kg, SpO2 99 %.  Medical Problem List and Plan: 1.T6 paraplegia ASIA Dsecondary to mets to Spinal cord/tumors compressing /prostate CA -patient may shower -ELOS/Goals:10-14 days- goals supervision  con't PT and OT- finishing radiation tomorrow.  2. Antithrombotics: -DVT/anticoagulation:Pharmaceutical:Lovenox -will need for at least 2 months due to [place], no matter how far he's walking -antiplatelet therapy: N/a 3. Pain Management:MS contin bid with oxycodone prn.con't and change as required 4. Mood:LCSW to follow for evaluation and support. -antipsychotic agents: N/a 5. Neuropsych: This patientiscapable of making decisions on hisown behalf. 6. Skin/Wound Care:Routine pressure relief measures. 7. Fluids/Electrolytes/Nutrition:Monitor I/O. Check lytes in am.  8. HTN: Monitor BP tid--continue Norvasc.  --ResumeCozaarif BP continues to be labile  6/1- BP 139/79 this AM- overall borderline high- will wait to restart Cozaar- Of note, HR is 57 this AM  6/2- will restart Cozaar- 25 mg daily- will restart since BP is still  elevated in 150s.  9. Metastatic prostate cancer to spine: XRT ongoing --slow steroid taper--4 mg tid X 3 days followed by bid X 7 days-->daily X 7 days, then 2 mg daily X 7d days.  10. At risk for spasticity and neurogenic bowel and bladder- will scan bladder to make sure emptying and monitor bowels.  6/1- Pt reports small BM overnight- con't to monitor for urinary retention- as well as con't bowel meds for constipation.   6/2- LBM ovenright- con't meds/regimen 11. L foot drop  6/1- will make sure has PRAFO ordered for L and wear at night.  12. Hyperkalemia  6/2- will give dose of Lokelma 10G x1- and will recheck in AM    LOS: 2 days A FACE TO FACE EVALUATION WAS PERFORMED  Mycah Mcdougall 12/22/2020, 8:43  AM

## 2020-12-23 ENCOUNTER — Ambulatory Visit
Admit: 2020-12-23 | Discharge: 2020-12-23 | Disposition: A | Discharge status: Home / Self Care | Payer: Medicare HMO | Attending: Radiation Oncology | Admitting: Radiation Oncology

## 2020-12-23 DIAGNOSIS — Z510 Encounter for antineoplastic radiation therapy: Secondary | ICD-10-CM | POA: Diagnosis not present

## 2020-12-23 LAB — CBC WITH DIFFERENTIAL/PLATELET
Abs Immature Granulocytes: 0.09 10*3/uL — ABNORMAL HIGH (ref 0.00–0.07)
Basophils Absolute: 0 10*3/uL (ref 0.0–0.1)
Basophils Relative: 0 %
Eosinophils Absolute: 0 10*3/uL (ref 0.0–0.5)
Eosinophils Relative: 0 %
HCT: 37.2 % — ABNORMAL LOW (ref 39.0–52.0)
Hemoglobin: 12.5 g/dL — ABNORMAL LOW (ref 13.0–17.0)
Immature Granulocytes: 1 %
Lymphocytes Relative: 3 %
Lymphs Abs: 0.2 10*3/uL — ABNORMAL LOW (ref 0.7–4.0)
MCH: 33.9 pg (ref 26.0–34.0)
MCHC: 33.6 g/dL (ref 30.0–36.0)
MCV: 100.8 fL — ABNORMAL HIGH (ref 80.0–100.0)
Monocytes Absolute: 0.6 10*3/uL (ref 0.1–1.0)
Monocytes Relative: 10 %
Neutro Abs: 5.5 10*3/uL (ref 1.7–7.7)
Neutrophils Relative %: 86 %
Platelets: 125 10*3/uL — ABNORMAL LOW (ref 150–400)
RBC: 3.69 MIL/uL — ABNORMAL LOW (ref 4.22–5.81)
RDW: 12.7 % (ref 11.5–15.5)
WBC: 6.4 10*3/uL (ref 4.0–10.5)
nRBC: 0 % (ref 0.0–0.2)

## 2020-12-23 LAB — BASIC METABOLIC PANEL
Anion gap: 6 (ref 5–15)
BUN: 31 mg/dL — ABNORMAL HIGH (ref 8–23)
CO2: 28 mmol/L (ref 22–32)
Calcium: 8.8 mg/dL — ABNORMAL LOW (ref 8.9–10.3)
Chloride: 100 mmol/L (ref 98–111)
Creatinine, Ser: 1.31 mg/dL — ABNORMAL HIGH (ref 0.61–1.24)
GFR, Estimated: 59 mL/min — ABNORMAL LOW (ref >60)
Glucose, Bld: 106 mg/dL — ABNORMAL HIGH (ref 70–99)
Potassium: 5 mmol/L (ref 3.5–5.1)
Sodium: 134 mmol/L — ABNORMAL LOW (ref 135–145)

## 2020-12-23 MED ORDER — HYDROCORTISONE ACETATE 25 MG RE SUPP
25.0000 mg | Freq: Two times a day (BID) | RECTAL | Status: AC
  Administered 2020-12-23 – 2020-12-24 (×2): 25 mg via RECTAL
  Filled 2020-12-23 (×3): qty 1

## 2020-12-23 MED ORDER — WITCH HAZEL-GLYCERIN EX PADS
MEDICATED_PAD | CUTANEOUS | Status: DC | PRN
  Filled 2020-12-23: qty 100

## 2020-12-23 MED ORDER — HYDROCORTISONE ACETATE 25 MG RE SUPP: 25.0000 mg | Freq: Two times a day (BID) | RECTAL | Status: DC | PRN

## 2020-12-23 NOTE — Progress Notes (Signed)
Occupational Therapy Session Note  Patient Details  Name: Terry Mitchell MRN: [id number] Date of Birth: 10/14/1950  Today's Date: 12/23/2020 OT Individual Time: 8527-7824 OT Individual Time Calculation (min): 73 min    Short Term Goals: Week 1:  OT Short Term Goal 1 (Week 1): Pt will complete toilet/BSC transfers with LRAD with Mod A consistently OT Short Term Goal 2 (Week 1): Pt will complete sit <> stands with Min A at LRAD in prep for ADL OT Short Term Goal 3 (Week 1): Pt will stand for 1-3 mins with unilateral support to improve LB ADL OT Short Term Goal 4 (Week 1): Pt will complete LB dressing w/ no more than Min A  Skilled Therapeutic Interventions/Progress Updates:    Pt resting in bed upon arrival and ready to get OOB for therapy. Pt declined bathing or changing clothes this morning, stating that he took "a good shower" yesterday. Supine>sit EOB with supervision. Pt required min A for donning Lt shoe but able to don Rt and tie both shoes without assistance. Stand pivot tranfser with RW at min A. Pt states his LE feel numb and he has to look at them to control. Pt with BLE ataxic movements. Pt propelled to ortho gym and transferred to NuStep-7 mins level 5. Pt transferred back to w/c and propelled to tub room. TTB transfer with min A. Pt propelled to main gym and transferred to EOM. Pt completed 4x8 half squats with UE on EOM and UE on Rt knee. Pt required min A for power up. Pt transferred back to w/c and propelled to room. Pt remained in room with all needs within reach. Seat alarm activated.   Therapy Documentation Precautions:  Precautions Precautions: Fall Precaution Comments: ataxic LEs, fall risk Restrictions Weight Bearing Restrictions: No  Pain:   Therapy/Group: Individual Therapy  Leroy Libman 12/23/2020, 10:59 AM

## 2020-12-23 NOTE — Progress Notes (Signed)
PROGRESS NOTE   Subjective/Complaints:  Pt's K+ 5.0- will recheck Monday LBM overnight.  No issues except bleeding hemorrhoids with BM.   ROS:  Pt denies SOB, abd pain, CP, N/V/C/D, and vision changes    Objective:   No results found. Recent Labs    12/21/20 0511 12/23/20 0650  WBC 5.5 6.4  HGB 12.2* 12.5*  HCT 35.8* 37.2*  PLT 133* 125*   Recent Labs    12/21/20 0511 12/23/20 0650  NA 131* 134*  K 5.6* 5.0  CL 95* 100  CO2 28 28  GLUCOSE 116* 106*  BUN 31* 31*  CREATININE 1.24 1.31*  CALCIUM 8.8* 8.8*    Intake/Output Summary (Last 24 hours) at 12/23/2020 7341 Last data filed at 12/23/2020 0800 Gross per 24 hour  Intake 680 ml  Output 675 ml  Net 5 ml        Physical Exam: Vital Signs Blood pressure (!) 150/94, pulse (!) 52, temperature 98.3 F (36.8 C), resp. rate 15, weight 88 kg, SpO2 100 %.      General: awake, alert, appropriate, sitting up in bed; NAD HENT: conjugate gaze; oropharynx moist CV: bradycardic rate; no JVD Pulmonary: CTA B/L; no W/R/R- good air movement GI: soft, NT, ND, (+)BS Psychiatric: appropriate Neurological: Ox3; no change in sensory level Musculoskeletal:  General: No swelling.  Cervical back: Normal range of motionand neck supple.  Comments: Pt's strength in UEs 5/5 B/L in biceps, triceps, WE, grip and finger abd B/L LEs: RLE- HF 4-/5, KE 4-/5, DF 4+/5, PF 4-/5 LLE: HF 3/5, KE 4-/5, DF 3+/5, and PF 4-/5 Skin: General: Skin is warmand dry.  Comments: Has IV L hand- looks OK Skin looks great except has radiation mark as well as fungal toenails/very thickened  Neurological:  Mental Status: He is alertand oriented to person, place, and time.  Sensory: Sensory deficitpresent.  Motor: [place]ry [place].  Comments: Speech clear. Sensory deficits from waist down with paraplegia.  Sensation decreased a lot from T7 on down to S5  B/L    Assessment/Plan: 1. Functional deficits which require 3+ hours per day of interdisciplinary therapy in a comprehensive inpatient rehab setting.  Physiatrist is providing close team supervision and 24 hour management of active medical problems listed below.  Physiatrist and rehab team continue to assess barriers to discharge/monitor patient progress toward functional and medical goals  Care Tool:  Bathing    Body parts bathed by patient: (P) Right arm,Left arm,Chest,Abdomen,Front perineal area,Buttocks,Right upper leg,Left upper leg,Right lower leg,Left lower leg,Face (sitting with lateral leans)         Bathing assist Assist Level: (P) Minimal Assistance - Patient > 75%     Upper Body Dressing/Undressing Upper body dressing   What is the patient wearing?: (P) Pull over shirt    Upper body assist Assist Level: (P) Set up assist    Lower Body Dressing/Undressing Lower body dressing      What is the patient wearing?: Underwear/pull up,Pants     Lower body assist Assist for lower body dressing: (P) Minimal Assistance - Patient > 75% (sit to stand)     Toileting Toileting    Toileting assist Assist for toileting: Minimal  Assistance - Patient > 75%     Transfers Chair/bed transfer  Transfers assist     Chair/bed transfer assist level: Minimal Assistance - Patient > 75%     Locomotion Ambulation   Ambulation assist      Assist level: 2 helpers Assistive device: Walker-rolling Max distance: 17'   Walk 10 feet activity   Assist     Assist level: 2 helpers Assistive device: Walker-rolling   Walk 50 feet activity   Assist Walk 50 feet with 2 turns activity did not occur: Safety/medical concerns         Walk 150 feet activity   Assist Walk 150 feet activity did not occur: Safety/medical concerns         Walk 10 feet on uneven surface  activity   Assist Walk 10 feet on uneven surfaces activity did not occur: Safety/medical  concerns         Wheelchair     Assist Will patient use wheelchair at discharge?: Yes Type of Wheelchair: Manual    Wheelchair assist level: Supervision/Verbal cueing Max wheelchair distance: 150'    Wheelchair 50 feet with 2 turns activity    Assist        Assist Level: Supervision/Verbal cueing   Wheelchair 150 feet activity     Assist      Assist Level: Supervision/Verbal cueing   Blood pressure (!) 150/94, pulse (!) 52, temperature 98.3 F (36.8 C), resp. rate 15, weight 88 kg, SpO2 100 %.  Medical Problem List and Plan: 1.T6 paraplegia ASIA Dsecondary to mets to Spinal cord/tumors compressing [place]/prostate CA -patient may shower -ELOS/Goals:10-14 days- goals supervision  con't PT and OT- Radiation to actually finish Monday- they changed it.   2. Antithrombotics: -DVT/anticoagulation:Pharmaceutical:Lovenox -will need for at least 2 months due to [place], no matter how far he's walking -antiplatelet therapy: N/a 3. Pain Management:MS contin bid with oxycodone prn.con't and change as required 4. Mood:LCSW to follow for evaluation and support. -antipsychotic agents: N/a 5. Neuropsych: This patientiscapable of making decisions on hisown behalf. 6. Skin/Wound Care:Routine pressure relief measures. 7. Fluids/Electrolytes/Nutrition:Monitor I/O. Check lytes in am.  8. HTN: Monitor BP tid--continue Norvasc.  --ResumeCozaarif BP continues to be labile  6/1- BP 139/79 this AM- overall borderline high- will wait to restart Cozaar- Of note, HR is 57 this AM  6/2- will restart Cozaar- 25 mg daily- will restart since BP is still elevated in 150s.   6/3- BP - just got dose this AM- will monitor 9. Metastatic prostate cancer to spine: XRT ongoing --slow steroid taper--4 mg tid X 3 days followed by bid X 7 days-->daily X 7 days, then 2 mg daily X 7d days.  10. At risk  for spasticity and neurogenic bowel and bladder- will scan bladder to make sure emptying and monitor bowels.  6/1- Pt reports small BM overnight- con't to monitor for urinary retention- as well as con't bowel meds for constipation.   6/3- LBM overnight- con't meds 11. L foot drop  6/1- will make sure has PRAFO ordered for L and wear at night.  12. Hyperkalemia  6/2- will give dose of Lokelma 10G x1- and will recheck in AM  6/3- K+ down to 5.0- will recheck Monday- might need Lokelma more frequently.  13. Hemorrhoids  6/3- will order Tucks and Anusol suppository since bleeding with BMs.      LOS: 3 days A FACE TO FACE EVALUATION WAS PERFORMED  Tommie Dejoseph 12/23/2020, 8:32 AM

## 2020-12-23 NOTE — Progress Notes (Signed)
Occupational Therapy Session Note  Patient Details  Name: Terry Mitchell MRN: [id number] Date of Birth: 1950/10/04  Today's Date: 12/23/2020 OT Individual Time: 1104-1200 OT Individual Time Calculation (min): 56 min   Short Term Goals: Week 1:  OT Short Term Goal 1 (Week 1): Pt will complete toilet/BSC transfers with LRAD with Mod A consistently OT Short Term Goal 2 (Week 1): Pt will complete sit <> stands with Min A at LRAD in prep for ADL OT Short Term Goal 3 (Week 1): Pt will stand for 1-3 mins with unilateral support to improve LB ADL OT Short Term Goal 4 (Week 1): Pt will complete LB dressing w/ no more than Min A  Skilled Therapeutic Interventions/Progress Updates:    Pt greeted in the w/c with no c/o pain. ADL needs met. He wanted to work on strengthening his legs during session. Escorted him via w/c to the [employer] machine. Pt performed seated marches with resistance settings set at 30 cm/sec, sustained activity for 5 minutes. Afterwards he was escorted to an outdoor setting. Worked on sit<stands with RW and standing balance while in front of the water fountain. Worked on equal LE weightbearing as he favors the Rt leg (per pt, he's had a leg length discrepancy since birth, Lt LE being longer than the Rt and never issued an orthotic to correct). Pt completed mini squats in standing and also worked on decreasing UE reliance/weight on device to strengthen legs and improve upright posture. Pt completed 3 stands and then self propelled w/c a bit outdoors to work on UB strengthening. Afterwards he was escorted via w/c back to the room, guided him through 5 w/c push ups, maintaining full body contraction while buttocks was cleared from w/c for strength building in midranges. Pt remained in w/c with all needs within reach and safety belt fastened.   Therapy Documentation Precautions:  Precautions Precautions: Fall Precaution Comments: ataxic LEs, fall risk Restrictions Weight Bearing  Restrictions: No ADL: ADL Eating: Not assessed Grooming: Setup Where Assessed-Grooming: Sitting at sink Upper Body Bathing: Setup Lower Body Bathing: Minimal assistance (lateral leans, seated in wheelchair) Where Assessed-Lower Body Bathing: Sitting at sink Upper Body Dressing: Setup Lower Body Dressing: Moderate assistance Toileting: Not assessed Toilet Transfer: Not assessed      Therapy/Group: Individual Therapy  Rosamae Rocque A Elyanah Farino 12/23/2020, 12:23 PM

## 2020-12-23 NOTE — Progress Notes (Signed)
Physical Therapy Session Note  Patient Details  Name: Howard Bunte MRN: [id number] Date of Birth: 1951-02-05  Today's Date: 12/23/2020 PT Individual Time: 0800-0859 PT Individual Time Calculation (min): 59 min   Short Term Goals: Week 1:  PT Short Term Goal 1 (Week 1): Pt will perform least restrictive transfer with min A consistently PT Short Term Goal 2 (Week 1): Pt will initiate gait training outside of // bars with LRAD PT Short Term Goal 3 (Week 1): Pt will initiate stair training as safe and able  Skilled Therapeutic Interventions/Progress Updates:  Patient supine in bed on entrance to room. Patient alert and agreeable to PT session. Patient denied pain during session.  Therapeutic Activity: Bed Mobility: Patient performed supine --> sit with supervision requiring use of bedrail and unable to fully pull back bedlinens. Setup with laced shoes on floor and pt able to donn shoes requiring extra time with CGA for holding shoe in place on floor. Ties shoes while maintaining seated balance with supervision. At end of session, pt returns to supine with CGA for BLE only. Able to position self toward [facility] with supervision.  Transfers: Patient performed squat pivot transfer to L side from bed --> w/c with Min A/CGA initially. VC/ tc for technique and hand/ foot placement. Squat pivot and lateral scoot transfers w/c <> mat table with CGA/supervision and vc for technique demonstrating improvement throughout session. STS in parallel bars with Min A for power up using pull-to-stand technique with vc for forward hand placement, forward lean, and for controlled descent to sit.  Gait Training:  Patient ambulated fwd/ bkwd 8' x3 using parallel bars with Min A for L>R knee block to prevent buckling. Demonstrated hinged forward posture at hips throughout. Provided vc/ tc for quad activation during stance phase bilaterally, decrease step length during bkwd gait, improve upright posture.  Wheelchair  Mobility:  Patient propelled wheelchair 150' x2 with supervision using BUE. Provided minimal vc for technique. Pt also able to maneuver w/c in slalom through 5 cones placed 6 feet apart with good control.   Neuromuscular Re-ed: NMR facilitated during session with focus on standing balance and LE coordination/ motor control. Pt guided in toe taps of each LE to colored discs on floor when each color is called. Started with single color, increased to two colors and then to three colors with good recall of sequence, accuracy of color and hitting targets bilaterally. Movements smoother with RLE >LLE however movement pattern not noted to be jerky/ ataxic, but demos more pausing during motion. NMR performed for improvements in motor control and coordination, balance, sequencing, judgement, and self confidence/ efficacy in performing all aspects of mobility at highest level of independence.   Patient supine at end of session with brakes locked, bed alarm set, and all needs within reach.     Therapy Documentation Precautions:  Precautions Precautions: Fall Precaution Comments: ataxic LEs, fall risk Restrictions Weight Bearing Restrictions: No  Therapy/Group: Individual Therapy  Alger Simons PT, DPT 12/23/2020, 9:02 AM

## 2020-12-23 NOTE — IPOC Note (Signed)
Overall Plan of Care [facility]) Patient Details Name: Terry Mitchell MRN: [id number] DOB: January 09, 1951  Admitting Diagnosis: Acute incomplete paraplegia [facility])  Hospital Problems: Principal Problem:   Acute incomplete paraplegia ([place]) Active Problems:   Prostate cancer metastatic to bone [facility])     Functional Problem List: Nursing Behavior,Endurance,Medication Management,Nutrition,Pain,Safety,Sensory  PT Balance,Endurance,Motor,Safety,Sensory  OT Balance,Endurance,Motor,Pain,Safety,Sensory  SLP    TR         Basic ADL's: OT Grooming,Bathing,Dressing,Toileting     Advanced  ADL's: OT       Transfers: PT Bed Mobility,Bed to [place]  OT Toilet,Tub/Shower     Locomotion: PT Ambulation,Wheelchair Mobility,Stairs     Additional Impairments: OT None  SLP        TR      Anticipated Outcomes Item Anticipated Outcome  Self Feeding no goal  Swallowing      Basic self-care  Supervision  Insurance underwriter Transfers Supervision  Bowel/Bladder  n/a  Transfers  Supervision  Locomotion  Supervision with LRAD  Communication     Cognition     Pain  <3  Safety/Judgment  Min assist and no falls   Therapy Plan: PT Intensity: Minimum of 1-2 x/day ,45 to 90 minutes PT Frequency: 5 out of 7 days PT Duration Estimated Length of Stay: 21-24 days OT Intensity: Minimum of 1-2 x/day, 45 to 90 minutes OT Frequency: 5 out of 7 days OT Duration/Estimated Length of Stay: 21-24 days     Due to the current state of emergency, patients may not be receiving their 3-hours of Medicare-mandated therapy.   Team Interventions: Nursing Interventions Patient/Family Education,Disease Management/Prevention,Pain Management,Medication Management,Discharge Planning,Psychosocial Support  PT interventions Ambulation/gait training,Balance/vestibular training,Community reintegration,Discharge planning,Disease management/prevention,DME/adaptive equipment  instruction,Functional mobility training,Neuromuscular re-education,Pain management,Patient/family education,Psychosocial support,Splinting/orthotics,Stair training,Therapeutic Activities,Therapeutic Exercise,UE/LE Strength taining/ROM,UE/LE Museum/gallery conservator propulsion/positioning  OT Interventions Balance/vestibular training,Discharge planning,Functional electrical stimulation,Pain management,Self Care/advanced ADL retraining,Therapeutic Activities,UE/LE Coordination activities,Visual/perceptual remediation/compensation,Therapeutic Exercise,Skin care/wound managment,Patient/family education,Functional mobility training,Disease Programmer, applications re-education,Psychosocial support,Splinting/orthotics,UE/LE Strength taining/ROM,Wheelchair propulsion/positioning  SLP Interventions    TR Interventions    SW/CM Interventions Discharge Planning,Patient/Family Education,Psychosocial Support   Barriers to Discharge MD  Medical stability, Home enviroment access/loayout, Lack of/limited family support, Weight bearing restrictions and Pending chemo/radiation  Nursing Decreased caregiver support,Home environment access/layout,Lack of/limited family support,Weight,Medication compliance,Pending chemo/radiation,Behavior,Nutrition means 2 level townhome, bed and bath upstairs  PT Inaccessible home environment,Home environment access/layout,Pending chemo/radiation    OT      SLP      SW Other (comments) reports are that partner Stanton Kidney has STM issues   Team Discharge Planning: Destination: PT-Home ,OT- Home , SLP-  Projected Follow-up: PT-Home health PT,24 hour supervision/assistance, OT-  Home health OT, SLP-  Projected Equipment Needs: PT-Rolling walker with 5" wheels,Wheelchair (measurements),Wheelchair cushion (measurements), OT- To be determined, SLP-  Equipment Details: PT-18x18 w/c, OT-  Patient/family involved in  discharge planning: PT- Patient,  OT-Patient, SLP-   MD ELOS: 21-24 days Medical Rehab Prognosis:  Good Assessment:  Pt is a 3 tr old male with stage IV prostate CA and Thoracic compression from mets tumor- he is a T6 ASIA D paraplegic due to cancer- his gets final radiation next week, which is palliative.   Other issues include mild increase in Cr/CKD and Hyperkalemia- s/p lokelma x1- will follow labs closely.   Goals supervision by d/c.     See Team Conference Notes for weekly updates to the plan of care

## 2020-12-24 ENCOUNTER — Other Ambulatory Visit: Payer: Self-pay | Admitting: Oncology

## 2020-12-24 DIAGNOSIS — C61 Malignant neoplasm of prostate: Secondary | ICD-10-CM

## 2020-12-24 NOTE — Progress Notes (Signed)
PROGRESS NOTE   Subjective/Complaints: Patient's chart reviewed- No issues reported overnight Vitals signs stable No complaints Day off today from therapy  ROS:  Pt denies SOB, abd pain, CP, N/V/C/D, and vision changes  Objective:   No results found. Recent Labs    12/23/20 0650  WBC 6.4  HGB 12.5*  HCT 37.2*  PLT 125*   Recent Labs    12/23/20 0650  NA 134*  K 5.0  CL 100  CO2 28  GLUCOSE 106*  BUN 31*  CREATININE 1.31*  CALCIUM 8.8*    Intake/Output Summary (Last 24 hours) at 12/24/2020 1406 Last data filed at 12/24/2020 1230 Gross per 24 hour  Intake 400 ml  Output 1000 ml  Net -600 ml        Physical Exam: Vital Signs Blood pressure 121/75, pulse 68, temperature 98.5 F (36.9 C), temperature source Oral, resp. rate 15, weight 88 kg, SpO2 100 %. Gen: no distress, normal appearing HEENT: oral mucosa pink and moist, NCAT Cardio: Reg rate Chest: normal effort, normal rate of breathing Abd: soft, non-distended Ext: no edema Psych: pleasant, normal affect Musculoskeletal:  General: No swelling.  Cervical back: Normal range of motionand neck supple.  Comments: Pt's strength in UEs 5/5 B/L in biceps, triceps, WE, grip and finger abd B/L LEs: RLE- HF 4-/5, KE 4-/5, DF 4+/5, PF 4-/5 LLE: HF 3/5, KE 4-/5, DF 3+/5, and PF 4-/5 Skin: General: Skin is warmand dry.  Comments: Has IV L hand- looks OK Skin looks great except has radiation mark as well as fungal toenails/very thickened  Neurological:  Mental Status: He is alertand oriented to person, place, and time.  Sensory: Sensory deficitpresent.  Motor: [place].  Comments: Speech clear. Sensory deficits from waist down with paraplegia.  Sensation decreased a lot from T7 on down to S5 B/L    Assessment/Plan: 1. Functional deficits which require 3+ hours per day of interdisciplinary therapy in a  comprehensive inpatient rehab setting.  Physiatrist is providing close team supervision and 24 hour management of active medical problems listed below.  Physiatrist and rehab team continue to assess barriers to discharge/monitor patient progress toward functional and medical goals  Care Tool:  Bathing    Body parts bathed by patient: (P) Right arm,Left arm,Chest,Abdomen,Front perineal area,Buttocks,Right upper leg,Left upper leg,Right lower leg,Left lower leg,Face (sitting with lateral leans)         Bathing assist Assist Level: (P) Minimal Assistance - Patient > 75%     Upper Body Dressing/Undressing Upper body dressing   What is the patient wearing?: (P) Pull over shirt    Upper body assist Assist Level: (P) Set up assist    Lower Body Dressing/Undressing Lower body dressing      What is the patient wearing?: Underwear/pull up,Pants     Lower body assist Assist for lower body dressing: (P) Minimal Assistance - Patient > 75% (sit to stand)     Chartered loss adjuster assist Assist for toileting: Minimal Assistance - Patient > 75%     Transfers Chair/bed transfer  Transfers assist     Chair/bed transfer assist level: Minimal Assistance - Patient > 75%  Locomotion Ambulation   Ambulation assist      Assist level: 2 helpers Assistive device: Walker-rolling Max distance: 17'   Walk 10 feet activity   Assist     Assist level: 2 helpers Assistive device: Walker-rolling   Walk 50 feet activity   Assist Walk 50 feet with 2 turns activity did not occur: Safety/medical concerns         Walk 150 feet activity   Assist Walk 150 feet activity did not occur: Safety/medical concerns         Walk 10 feet on uneven surface  activity   Assist Walk 10 feet on uneven surfaces activity did not occur: Safety/medical concerns         Wheelchair     Assist Will patient use wheelchair at discharge?: Yes Type of Wheelchair:  Manual    Wheelchair assist level: Supervision/Verbal cueing Max wheelchair distance: 150'    Wheelchair 50 feet with 2 turns activity    Assist        Assist Level: Supervision/Verbal cueing   Wheelchair 150 feet activity     Assist      Assist Level: Supervision/Verbal cueing   Blood pressure 121/75, pulse 68, temperature 98.5 F (36.9 C), temperature source Oral, resp. rate 15, weight 88 kg, SpO2 100 %.  Medical Problem List and Plan: 1.T6 paraplegia ASIA Dsecondary to mets to Spinal cord/tumors compressing [place]/prostate CA -patient may shower -ELOS/Goals:10-14 days- goals supervision  Continue PT and OT- Radiation to actually finish Monday- they changed it.   2. Antithrombotics: -DVT/anticoagulation:Pharmaceutical:Lovenox -will need for at least 2 months due to [place], no matter how far he's walking -antiplatelet therapy: N/a 3. Pain Management:MS contin bid with oxycodone prn.Continue and change as required 4. Mood:LCSW to follow for evaluation and support. -antipsychotic agents: N/a 5. Neuropsych: This patientiscapable of making decisions on hisown behalf. 6. Skin/Wound Care:Routine pressure relief measures. 7. Fluids/Electrolytes/Nutrition:Monitor I/O. Check lytes in am.  8. HTN: Monitor BP tid--continue Norvasc.  --ResumeCozaarif BP continues to be labile  6/1- BP 139/79 this AM- overall borderline high- will wait to restart Cozaar- Of note, HR is 57 this AM  6/2- will restart Cozaar- 25 mg daily- will restart since BP is still elevated in 150s.   6/4: better controlled, continue Cozaar 9. Metastatic prostate cancer to spine: XRT ongoing --slow steroid taper--4 mg tid X 3 days followed by bid X 7 days-->daily X 7 days, then 2 mg daily X 7d days.  10. At risk for spasticity and neurogenic bowel and bladder- will scan bladder to make sure emptying and monitor  bowels.  6/1- Pt reports small BM overnight- con't to monitor for urinary retention- as well as con't bowel meds for constipation.   6/3- LBM overnight- con't meds  6/4: continue laxatives 11. L foot drop  6/1- will make sure has PRAFO ordered for L and wear at night.  12. Hyperkalemia  6/2- will give dose of Lokelma 10G x1- and will recheck in AM  6/3- K+ down to 5.0- will recheck Monday- might need Lokelma more frequently.  13. Hemorrhoids  6/3- will order Tucks and Anusol suppository since bleeding with BMs.      LOS: 4 days A FACE TO FACE EVALUATION WAS PERFORMED  Terry Mitchell P Trude Cansler 12/24/2020, 2:06 PM

## 2020-12-25 NOTE — Progress Notes (Signed)
Occupational Therapy Session Note  Patient Details  Name: Terry Mitchell MRN: [id number] Date of Birth: [birth year]  Today's Date: 12/25/2020 OT Individual Time: 1300-1327 OT Individual Time Calculation (min): 27 min    Short Term Goals: Week 1:  OT Short Term Goal 1 (Week 1): Pt will complete toilet/BSC transfers with LRAD with Mod A consistently OT Short Term Goal 2 (Week 1): Pt will complete sit <> stands with Min A at LRAD in prep for ADL OT Short Term Goal 3 (Week 1): Pt will stand for 1-3 mins with unilateral support to improve LB ADL OT Short Term Goal 4 (Week 1): Pt will complete LB dressing w/ no more than Min A  Skilled Therapeutic Interventions/Progress Updates:    OT intervention with focus on stand pivot transfers and sit<>stand from EOM. Pt propelled w/c to gym and transferred to [facility] with min A for stand pivot transfers. Sit<>stand from EOM; 3x5 with Rt hand on RW and Lt hand on EOM; 3x5 with Rt hand on mat and Lt hand on Lt knee; 3x5 with Rt hand on chest and Lt hand on knee. Min A for all tasks. Pt returned to w/c and propelled w/c back to room. Pt remained in w/c. All needs within reach and seat alarm activated.  Therapy Documentation Precautions:  Precautions Precautions: Fall Precaution Comments: ataxic LEs, fall risk Restrictions Weight Bearing Restrictions: No   Pain:  Pt denies pain this afternoon   Therapy/Group: Individual Therapy  Leroy Libman 12/25/2020, 1:28 PM

## 2020-12-25 NOTE — Progress Notes (Signed)
Physical Therapy Session Note  Patient Details  Name: Terry Mitchell MRN: [id number] Date of Birth: [birth year]  Today's Date: 12/25/2020 PT Individual Time: 1100-1200; 1615-1700 PT Individual Time Calculation (min): 60 min and 45 min  Short Term Goals: Week 1:  PT Short Term Goal 1 (Week 1): Pt will perform least restrictive transfer with min A consistently PT Short Term Goal 2 (Week 1): Pt will initiate gait training outside of // bars with LRAD PT Short Term Goal 3 (Week 1): Pt will initiate stair training as safe and able  Skilled Therapeutic Interventions/Progress Updates:    Session 1: Pt received seated in w/c in room, agreeable to PT session. No complaints of pain this date. Pt is Supervision for w/c mobility to/from therapy gym with use of BUE. Pt able to set up w/c and manage w/c parts for squat pivot transfer to mat table, CGA for transfer, Supervision for management of w/c parts with min cueing. Sit to stand with min A to RW throughout session. Standing alt L/R marches progressing to target taps with use of mirror for visual feedback. Pt continues to exhibit ataxic BLE. Added 4# ankle weights to each LE for improved proprioception. Pt exhibits improved control of LE with use of weights. Ambulation x 10 ft, 2 x 20 ft in // bars with min to mod A for balance, intermittent LE buckling R>L but no LOB. Utilized 4# weights during gait for improved proprioceptive input as well as ACE-wrapped R knee for improved stability. Pt continues to exhibit scissoring gait pattern L>R, flexed trunk, and heavy UE reliance during gait. Standing alt L/R 4" step-taps in // bars with min A for balance. Mini-squats x 10 reps in // bars with min A for balance. Pt left seated in w/c in room with needs in reach, chair alarm in place at end of session.  Session 2: Pt received seated in w/c in room, agreeable to PT session. No complaints of pain. Manual w/c propulsion at Supervision level with use of BUE throughout  session. Sit to stand and stand pivot transfer with RW and min A with 4# weights on B ankles for increased proprioceptive input. Pt requires increased time to complete transfer and for management of BLE. Ambulation x 30 ft, x 40 ft with RW and min A with close w/c follow for safety, use of 4# ankle weights. Pt continues to exhibit scissoring of gait L>R, decreased step length with RLE, and hyperextension of L knee > R knee during gait. Pt exhibits improved tolerance for gait training this date. Sidesteps L/R 2 x 10 ft with RW and min A for balance with focus on LE control and placement. Standing balance with min A with one UE support on RW performing horseshoe toss, 4 x 6 tosses. Pt exhibits improved ability to maintain standing balance with decreased UE support. Pt requests to return to bed at end of session due to fatigue. Squat pivot transfer with CGA. Sit to supine Supervision. Pt left seated in bed with needs in reach, bed alarm in place at end of session.  Therapy Documentation Precautions:  Precautions Precautions: Fall Precaution Comments: ataxic LEs, fall risk Restrictions Weight Bearing Restrictions: No   Therapy/Group: Individual Therapy   Excell Seltzer, PT, DPT, CSRS  12/25/2020, 12:22 PM

## 2020-12-25 NOTE — Progress Notes (Signed)
Occupational Therapy Session Note  Patient Details  Name: Terry Mitchell MRN: [id number] Date of Birth: 06/28/1951  Today's Date: 12/25/2020 OT Individual Time: 0700-0800 OT Individual Time Calculation (min): 60 min    Short Term Goals: Week 1:  OT Short Term Goal 1 (Week 1): Pt will complete toilet/BSC transfers with LRAD with Mod A consistently OT Short Term Goal 2 (Week 1): Pt will complete sit <> stands with Min A at LRAD in prep for ADL OT Short Term Goal 3 (Week 1): Pt will stand for 1-3 mins with unilateral support to improve LB ADL OT Short Term Goal 4 (Week 1): Pt will complete LB dressing w/ no more than Min A  Skilled Therapeutic Interventions/Progress Updates:    OT intervention with focus on bathing at shower level, dressing with sit<>stand from w/c, standing balance, and BLE tasks to increase strength and function. All squat pivot transfers with CGA/close supervision. Bathing at shower level with supervision using lateral leans to clean buttocks. Sit<>stand and standing balance to pull pants over hips with min A for balance. 7 mins NuStep at level 5 BLE only. Pt remained in w/c with seat alarm activated and all needs within reach.   Therapy Documentation Precautions:  Precautions Precautions: Fall Precaution Comments: ataxic LEs, fall risk Restrictions Weight Bearing Restrictions: No  Pain: Pt denies pain this morning  Therapy/Group: Individual Therapy  Leroy Libman 12/25/2020, 8:02 AM

## 2020-12-25 NOTE — Progress Notes (Signed)
PROGRESS NOTE   Subjective/Complaints: No complaints Eating Snickers bar Tolerated OT well today  ROS:  Pt denies SOB, abd pain, CP, N/V/C/D, and vision changes  Objective:   No results found. Recent Labs    12/23/20 0650  WBC 6.4  HGB 12.5*  HCT 37.2*  PLT 125*   Recent Labs    12/23/20 0650  NA 134*  K 5.0  CL 100  CO2 28  GLUCOSE 106*  BUN 31*  CREATININE 1.31*  CALCIUM 8.8*    Intake/Output Summary (Last 24 hours) at 12/25/2020 1521 Last data filed at 12/25/2020 1300 Gross per 24 hour  Intake 840 ml  Output 300 ml  Net 540 ml        Physical Exam: Vital Signs Blood pressure 123/71, pulse 69, temperature 98.4 F (36.9 C), resp. rate 17, weight 90.2 kg, SpO2 100 %. Gen: no distress, normal appearing HEENT: oral mucosa pink and moist, NCAT Cardio: Reg rate Chest: normal effort, normal rate of breathing Abd: soft, non-distended Ext: no edema Psych: pleasant, normal affect Musculoskeletal:  General: No swelling.  Cervical back: Normal range of motionand neck supple.  Comments: Pt's strength in UEs 5/5 B/L in biceps, triceps, WE, grip and finger abd B/L LEs: RLE- HF 4-/5, KE 4-/5, DF 4+/5, PF 4-/5 LLE: HF 3/5, KE 4-/5, DF 3+/5, and PF 4-/5 Skin: General: Skin is warmand dry.  Comments: Has IV L hand- looks OK Skin looks great except has radiation mark as well as fungal toenails/very thickened  Neurological:  Mental Status: He is alertand oriented to person, place, and time.  Sensory: Sensory deficitpresent.  Motor: [place]ry [place].  Comments: Speech clear. Sensory deficits from waist down with paraplegia.  Sensation decreased a lot from T7 on down to S5 B/L    Assessment/Plan: 1. Functional deficits which require 3+ hours per day of interdisciplinary therapy in a comprehensive inpatient rehab setting.  Physiatrist is providing close team supervision  and 24 hour management of active medical problems listed below.  Physiatrist and rehab team continue to assess barriers to discharge/monitor patient progress toward functional and medical goals  Care Tool:  Bathing    Body parts bathed by patient: Right arm,Left arm,Chest,Abdomen,Front perineal area,Buttocks,Right upper leg,Left upper leg,Right lower leg,Left lower leg,Face         Bathing assist Assist Level: Contact Guard/Touching assist     Upper Body Dressing/Undressing Upper body dressing   What is the patient wearing?: Pull over shirt    Upper body assist Assist Level: Independent    Lower Body Dressing/Undressing Lower body dressing      What is the patient wearing?: Underwear/pull up,Pants     Lower body assist Assist for lower body dressing: Contact Guard/Touching assist     Toileting Toileting    Toileting assist Assist for toileting: Minimal Assistance - Patient > 75%     Transfers Chair/bed transfer  Transfers assist     Chair/bed transfer assist level: Contact Guard/Touching assist     Locomotion Ambulation   Ambulation assist      Assist level: Moderate Assistance - Patient 50 - 74% Assistive device: Parallel bars Max distance: 20'   Walk 10 feet activity  Assist     Assist level: Moderate Assistance - Patient - 50 - 74% Assistive device: Parallel bars   Walk 50 feet activity   Assist Walk 50 feet with 2 turns activity did not occur: Safety/medical concerns         Walk 150 feet activity   Assist Walk 150 feet activity did not occur: Safety/medical concerns         Walk 10 feet on uneven surface  activity   Assist Walk 10 feet on uneven surfaces activity did not occur: Safety/medical concerns         Wheelchair     Assist Will patient use wheelchair at discharge?: Yes Type of Wheelchair: Manual    Wheelchair assist level: Supervision/Verbal cueing Max wheelchair distance: 150'    Wheelchair 50  feet with 2 turns activity    Assist        Assist Level: Supervision/Verbal cueing   Wheelchair 150 feet activity     Assist      Assist Level: Supervision/Verbal cueing   Blood pressure 123/71, pulse 69, temperature 98.4 F (36.9 C), resp. rate 17, weight 90.2 kg, SpO2 100 %.  Medical Problem List and Plan: 1.T6 paraplegia ASIA Dsecondary to mets to Spinal cord/tumors compressing [place]/prostate CA -patient may shower -ELOS/Goals:10-14 days- goals supervision  Continue PT and OT- Radiation to actually finish Monday- they changed it.   2. Antithrombotics: -DVT/anticoagulation:Pharmaceutical:Lovenox -will need for at least 2 months due to [place], no matter how far he's walking -antiplatelet therapy: N/a 3. Pain Management:MS contin bid with oxycodone prn.Continue and change as required 4. Mood:LCSW to follow for evaluation and support. -antipsychotic agents: N/a 5. Neuropsych: This patientiscapable of making decisions on hisown behalf. 6. Skin/Wound Care:Routine pressure relief measures. 7. Fluids/Electrolytes/Nutrition:Monitor I/O. Check lytes in am.  8. HTN: Monitor BP tid--continue Norvasc.  --ResumeCozaarif BP continues to be labile  6/1- BP 139/79 this AM- overall borderline high- will wait to restart Cozaar- Of note, HR is 57 this AM  6/2- will restart Cozaar- 25 mg daily- will restart since BP is still elevated in 150s.   6/4-6/5: better controlled, continue Cozaar 9. Metastatic prostate cancer to spine: XRT ongoing --slow steroid taper--4 mg tid X 3 days followed by bid X 7 days-->daily X 7 days, then 2 mg daily X 7d days.  10. At risk for spasticity and neurogenic bowel and bladder- will scan bladder to make sure emptying and monitor bowels.  6/1- Pt reports small BM overnight- con't to monitor for urinary retention- as well as con't bowel meds for constipation.    6/3- LBM overnight- con't meds  6/4-6/5: continue laxatives 11. L foot drop  6/1- will make sure has PRAFO ordered for L and wear at night.  12. Hyperkalemia  6/2- will give dose of Lokelma 10G x1- and will recheck in AM  6/3- K+ down to 5.0- will recheck Monday- might need Lokelma more frequently.  13. Hemorrhoids  6/3- will order Tucks and Anusol suppository since bleeding with BMs.      LOS: 5 days A FACE TO FACE EVALUATION WAS PERFORMED  Terry Mitchell 12/25/2020, 3:21 PM

## 2020-12-26 ENCOUNTER — Ambulatory Visit
Admit: 2020-12-26 | Discharge: 2020-12-26 | Disposition: A | Discharge status: Home / Self Care | Payer: Medicare HMO | Attending: Radiation Oncology | Admitting: Radiation Oncology

## 2020-12-26 ENCOUNTER — Encounter: Payer: Self-pay | Admitting: Oncology

## 2020-12-26 ENCOUNTER — Encounter: Payer: Self-pay | Admitting: Radiation Oncology

## 2020-12-26 DIAGNOSIS — Z510 Encounter for antineoplastic radiation therapy: Secondary | ICD-10-CM | POA: Diagnosis not present

## 2020-12-26 LAB — CBC WITH DIFFERENTIAL/PLATELET
Abs Immature Granulocytes: 0.07 10*3/uL (ref 0.00–0.07)
Basophils Absolute: 0 10*3/uL (ref 0.0–0.1)
Basophils Relative: 0 %
Eosinophils Absolute: 0 10*3/uL (ref 0.0–0.5)
Eosinophils Relative: 0 %
HCT: 37.1 % — ABNORMAL LOW (ref 39.0–52.0)
Hemoglobin: 12.2 g/dL — ABNORMAL LOW (ref 13.0–17.0)
Immature Granulocytes: 1 %
Lymphocytes Relative: 2 %
Lymphs Abs: 0.2 10*3/uL — ABNORMAL LOW (ref 0.7–4.0)
MCH: 33.5 pg (ref 26.0–34.0)
MCHC: 32.9 g/dL (ref 30.0–36.0)
MCV: 101.9 fL — ABNORMAL HIGH (ref 80.0–100.0)
Monocytes Absolute: 0.6 10*3/uL (ref 0.1–1.0)
Monocytes Relative: 8 %
Neutro Abs: 6.6 10*3/uL (ref 1.7–7.7)
Neutrophils Relative %: 89 %
Platelets: 138 10*3/uL — ABNORMAL LOW (ref 150–400)
RBC: 3.64 MIL/uL — ABNORMAL LOW (ref 4.22–5.81)
RDW: 12.8 % (ref 11.5–15.5)
WBC: 7.3 10*3/uL (ref 4.0–10.5)
nRBC: 0 % (ref 0.0–0.2)

## 2020-12-26 LAB — BASIC METABOLIC PANEL
Anion gap: 6 (ref 5–15)
BUN: 32 mg/dL — ABNORMAL HIGH (ref 8–23)
CO2: 28 mmol/L (ref 22–32)
Calcium: 8.6 mg/dL — ABNORMAL LOW (ref 8.9–10.3)
Chloride: 99 mmol/L (ref 98–111)
Creatinine, Ser: 1.18 mg/dL (ref 0.61–1.24)
GFR, Estimated: >60 mL/min (ref >60)
Glucose, Bld: 104 mg/dL — ABNORMAL HIGH (ref 70–99)
Potassium: 4.6 mmol/L (ref 3.5–5.1)
Sodium: 133 mmol/L — ABNORMAL LOW (ref 135–145)

## 2020-12-26 MED ORDER — DEXAMETHASONE 4 MG PO TABS
4.0000 mg | ORAL_TABLET | Freq: Two times a day (BID) | ORAL | Status: AC
  Administered 2020-12-26 – 2021-01-01 (×13): 4 mg via ORAL
  Filled 2020-12-26 (×13): qty 1

## 2020-12-26 MED ORDER — DEXAMETHASONE 2 MG PO TABS
2.0000 mg | ORAL_TABLET | Freq: Every day | ORAL | Status: DC
  Administered 2021-01-09 – 2021-01-12 (×4): 2 mg via ORAL
  Filled 2020-12-26 (×4): qty 1

## 2020-12-26 MED ORDER — DEXAMETHASONE 4 MG PO TABS
4.0000 mg | ORAL_TABLET | Freq: Every day | ORAL | Status: AC
  Administered 2021-01-02 – 2021-01-08 (×7): 4 mg via ORAL
  Filled 2020-12-26 (×7): qty 1

## 2020-12-26 NOTE — Progress Notes (Signed)
PROGRESS NOTE   Subjective/Complaints:  LBM last night pain still 4/10.  No issues- has last radiation today- asked if would be able to walk again normally?  ROS:  Pt denies SOB, abd pain, CP, N/V/C/D, and vision changes   Objective:   No results found. Recent Labs    12/26/20 0647  WBC 7.3  HGB 12.2*  HCT 37.1*  PLT 138*   Recent Labs    12/26/20 0647  NA 133*  K 4.6  CL 99  CO2 28  GLUCOSE 104*  BUN 32*  CREATININE 1.18  CALCIUM 8.6*    Intake/Output Summary (Last 24 hours) at 12/26/2020 1800 Last data filed at 12/26/2020 1315 Gross per 24 hour  Intake 570 ml  Output 600 ml  Net -30 ml        Physical Exam: Vital Signs Blood pressure 123/73, pulse 66, temperature 98.4 F (36.9 C), temperature source Oral, resp. rate 17, weight 88.6 kg, SpO2 100 %.   General: awake, alert, appropriate, sitting up in bed; NAD HENT: conjugate gaze; oropharynx moist CV: regular rate; no JVD Pulmonary: CTA B/L; no W/R/R- good air movement GI: soft, NT, ND, (+)BS Psychiatric: appropriate; flat affect Neurological: alert Musculoskeletal:  General: No swelling.  Cervical back: Normal range of motionand neck supple.  Comments: Pt's strength in UEs 5/5 B/L in biceps, triceps, WE, grip and finger abd B/L LEs: RLE- HF 4-/5, KE 4-/5, DF 4+/5, PF 4-/5 LLE: HF 3/5, KE 4-/5, DF 3+/5, and PF 4-/5 Skin: General: Skin is warmand dry. Skin looks great except has radiation mark as well as fungal toenails/very thickened  Neurological:  Mental Status: He is alertand oriented to person, place, and time.  Sensory: Sensory deficitpresent.  Motor: [place]ry [place].  Comments: Speech clear. Sensory deficits from waist down with paraplegia.  Sensation decreased a lot from T7 on down to S5 B/L    Assessment/Plan: 1. Functional deficits which require 3+ hours per day of interdisciplinary therapy  in a comprehensive inpatient rehab setting.  Physiatrist is providing close team supervision and 24 hour management of active medical problems listed below.  Physiatrist and rehab team continue to assess barriers to discharge/monitor patient progress toward functional and medical goals  Care Tool:  Bathing    Body parts bathed by patient: Right arm,Left arm,Chest,Abdomen,Front perineal area,Buttocks,Right upper leg,Left upper leg,Right lower leg,Left lower leg,Face         Bathing assist Assist Level: Contact Guard/Touching assist     Upper Body Dressing/Undressing Upper body dressing   What is the patient wearing?: Pull over shirt    Upper body assist Assist Level: Independent    Lower Body Dressing/Undressing Lower body dressing      What is the patient wearing?: Underwear/pull up,Pants     Lower body assist Assist for lower body dressing: Contact Guard/Touching assist     Toileting Toileting    Toileting assist Assist for toileting: Minimal Assistance - Patient > 75%     Transfers Chair/bed transfer  Transfers assist     Chair/bed transfer assist level: Minimal Assistance - Patient > 75%     Locomotion Ambulation   Ambulation assist      Assist  level: Minimal Assistance - Patient > 75% Assistive device: Walker-rolling Max distance: 26'   Walk 10 feet activity   Assist     Assist level: Minimal Assistance - Patient > 75% Assistive device: Walker-rolling   Walk 50 feet activity   Assist Walk 50 feet with 2 turns activity did not occur: Safety/medical concerns         Walk 150 feet activity   Assist Walk 150 feet activity did not occur: Safety/medical concerns         Walk 10 feet on uneven surface  activity   Assist Walk 10 feet on uneven surfaces activity did not occur: Safety/medical concerns         Wheelchair     Assist Will patient use wheelchair at discharge?: Yes Type of Wheelchair: Manual    Wheelchair  assist level: Supervision/Verbal cueing Max wheelchair distance: 150'    Wheelchair 50 feet with 2 turns activity    Assist        Assist Level: Supervision/Verbal cueing   Wheelchair 150 feet activity     Assist      Assist Level: Supervision/Verbal cueing   Blood pressure 123/73, pulse 66, temperature 98.4 F (36.9 C), temperature source Oral, resp. rate 17, weight 88.6 kg, SpO2 100 %.  Medical Problem List and Plan: 1.T6 paraplegia ASIA Dsecondary to mets to Spinal cord/tumors compressing [place]/prostate CA -patient may shower -ELOS/Goals:10-14 days- goals supervision  Continue PT and OT- Radiation to actually finish Monday- they changed it.    con't PT and OT- asking if will walk "again"- explained we cannot tell, but he's walking some with RW- which is better than expected.  2. Antithrombotics: -DVT/anticoagulation:Pharmaceutical:Lovenox -will need for at least 2 months due to [place], no matter how far he's walking -antiplatelet therapy: N/a 3. Pain Management:MS contin bid with oxycodone prn.Continue and change as required  6/6- not taking prns- just scheduled pain meds- con't regimen 4. Mood:LCSW to follow for evaluation and support. -antipsychotic agents: N/a 5. Neuropsych: This patientiscapable of making decisions on hisown behalf. 6. Skin/Wound Care:Routine pressure relief measures. 7. Fluids/Electrolytes/Nutrition:Monitor I/O. Check lytes in am.  8. HTN: Monitor BP tid--continue Norvasc.  --ResumeCozaarif BP continues to be labile  6/1- BP 139/79 this AM- overall borderline high- will wait to restart Cozaar- Of note, HR is 57 this AM  6/2- will restart Cozaar- 25 mg daily- will restart since BP is still elevated in 150s.   6/4-6/5: better controlled, continue Cozaar 9. Metastatic prostate cancer to spine: XRT ongoing --slow steroid taper--4 mg tid X 3 days  followed by bid X 7 days-->daily X 7 days, then 2 mg daily X 7d days.  10. At risk for spasticity and neurogenic bowel and bladder- will scan bladder to make sure emptying and monitor bowels.  6/1- Pt reports small BM overnight- con't to monitor for urinary retention- as well as con't bowel meds for constipation.   6/3- LBM overnight- con't meds  6/4-6/5: continue laxatives  6/6- refusing laxatives, but still bleeding hemorrhoids con't to encourage usage.  11. L foot drop  6/1- will make sure has PRAFO ordered for L and wear at night.  12. Hyperkalemia  6/2- will give dose of Lokelma 10G x1- and will recheck in AM  6/3- K+ down to 5.0- will recheck Monday- might need Lokelma more frequently.   6/6- K+ 4.6 today-con't to monitor 13. Hemorrhoids  6/3- will order Tucks and Anusol suppository since bleeding with BMs.  6/6- refusing miralax as well as anusol suppository even though he's still bleeding.  LOS: 6 days A FACE TO FACE EVALUATION WAS PERFORMED  Guneet Delpino 12/26/2020, 6:00 PM

## 2020-12-26 NOTE — Discharge Instructions (Addendum)
Inpatient Rehab Discharge Instructions  Terry Mitchell Discharge date and time:    Activities/Precautions/ Functional Status: Activity: no lifting, driving, or strenuous exercise for till cleared by MD Diet: regular diet Wound Care: keep wound clean and dry    Functional status:  ___ No restrictions     ___ Walk up steps independently _X__ 24/7 supervision/assistance   ___ Walk up steps with assistance ___ Intermittent supervision/assistance  ___ Bathe/dress independently ___ Walk with walker     _X__ Bathe/dress with assistance ___ Walk Independently    ___ Shower independently ___ Walk with assistance    ___ Shower with assistance _X__ No alcohol     ___ Return to work/school ________   COMMUNITY REFERRALS UPON DISCHARGE:      Outpatient: PT                  [name]  [phone]             Appointment Date/Time:*Please expect follow-up within 7-10 business days to schedule your appointment. If you have not received follow-up, be sure to contact the site directly.*  Medical Equipment/Items Ordered:Rolling walker and tub transfer bench                                                 Agency/Supplier:[facility] [employer] [phone]    Special Instructions:    My questions have been answered and I understand these instructions. I will adhere to these goals and the provided educational materials after my discharge from the hospital.  Patient/Caregiver Signature _______________________________ Date __________  Clinician Signature _______________________________________ Date __________  Please bring this form and your medication list with you to all your follow-up doctor's appointments.

## 2020-12-26 NOTE — Progress Notes (Signed)
Occupational Therapy Session Note  Patient Details  Name: Terry Mitchell MRN: [id number] Date of Birth: [birth year]  Today's Date: 12/26/2020 OT Individual Time: 0930-1040 OT Individual Time Calculation (min): 70 min    Short Term Goals: Week 1:  OT Short Term Goal 1 (Week 1): Pt will complete toilet/BSC transfers with LRAD with Mod A consistently OT Short Term Goal 2 (Week 1): Pt will complete sit <> stands with Min A at LRAD in prep for ADL OT Short Term Goal 3 (Week 1): Pt will stand for 1-3 mins with unilateral support to improve LB ADL OT Short Term Goal 4 (Week 1): Pt will complete LB dressing w/ no more than Min A  Skilled Therapeutic Interventions/Progress Updates:    Pt resting in bed upon arrival. Pt declined bathing and changing clothing this morning but requested to wash face and brush teeth at sink. Bed mobility with supervision. Squat pivot transfer to w/c with CGA. Grooming with supervision at w/c level. Pt propelled w/c to ortho gym and engaged dynamic standing task at BITS-1 min and 2 min. Pt maintained one UE as support on RW at all times. Pt with hyperextension of Bil knees to maintain stability. Pt propelled w/c to gym and performed stand pivot transfer to EOM with min A. Pt engaged in tasks with sit<>stand from EOM to focus on quad activation for half squats when leaning forward to retrieve items from stool. Pt required use of RUE to assist with pushing up to retrieve item. Pt completed sit>squat 3x8 with RUE pushing up and 3x8 with LUE on Lt knee to push up. Pt also engaged in stand>half squat with RW. Pt returned to room and remained in w/c. All needs within reach and seat alarm ativated.  Therapy Documentation Precautions:  Precautions Precautions: Fall Precaution Comments: ataxic LEs, fall risk Restrictions Weight Bearing Restrictions: No  Pain: Pt denies pain this morning   Therapy/Group: Individual Therapy  Leroy Libman 12/26/2020, 10:48 AM

## 2020-12-26 NOTE — Progress Notes (Signed)
Refused scheduled anusol HC supp. and miralax. Moderate continent BM. Blood noted on toliet paper. Left PRAFO applied at HS. No prn meds needed. Patrici Ranks A

## 2020-12-26 NOTE — Progress Notes (Signed)
Physical Therapy Session Note  Patient Details  Name: Terry Mitchell MRN: [id number] Date of Birth: 08/22/1950  Today's Date: 12/26/2020 PT Individual Time: 1100-1200; 1300-1405 PT Individual Time Calculation (min): 60 min and 65 min PT Missed Time: 10 min Missed Time Reason: toileting  Short Term Goals: Week 1:  PT Short Term Goal 1 (Week 1): Pt will perform least restrictive transfer with min A consistently PT Short Term Goal 2 (Week 1): Pt will initiate gait training outside of // bars with LRAD PT Short Term Goal 3 (Week 1): Pt will initiate stair training as safe and able  Skilled Therapeutic Interventions/Progress Updates:    Session 1: Pt received seated in w/c in room, agreeable to PT session. No complaints of pain this date. Manual w/c propulsion at Supervision level. Sit to stand with min A to RW. Ambulation 2 x 13 ft, 1 x 26 ft with RW and min A with 4# ankle weights for proprioceptive input, use of mirror for visual feedback. Pt exhibits decreased ataxia of limbs this date, ongoing decreased step length with RLE. Ambulation 2 x 26 ft with RW and min A with mirror, no weights on ankles. Pt continues to exhibit decreased step length with RLE that worsens with onset of fatigue as well as occasional foot drag L>R. Standing RLE cane step-overs with RW and min A for balance, 3 x 15 reps to fatigue. Standing mini-squats x 10 reps with RW and min A for balance. Standing B TKE with orange theraband, x 10 reps, x 15 reps with RW and CGA for balance. Pt left seated in w/c in room with needs in reach at end of session.  Session 2: Pt received seated in w/c in room, agreeable to PT session. No complaints of pain at rest, reports tightness in medial R knee with activity that improves at rest. Sit to stand with min A to RW throughout session. Standing alt L/R 3" step-taps with BUE support and min A for balance. Progression to 3" step-ups, 2 x 10 reps B with 2 handrails and min A for balance. Pt  exhibits L knee hyperextension in stance as well as ataxia in LLE > RLE when trying to place limb on step. Seated soccer ball kicks 2 x 20 reps B with focus on LE control. Sit to stand 4 x 5 reps with one UE support on chair, min A for balance with focus on decreased UE reliance during transfers. Attempt alt cone taps but pt unable to lift LE due to fatigue. Pt reports urge to use the bathroom. Squat pivot transfer back to w/c with CGA. Stedy transfer to elevated BSC over toilet due to fatigue. Pt left seated on BSC with call button in reach. Pt missed 10 min of scheduled therapy session for toileting.  Therapy Documentation Precautions:  Precautions Precautions: Fall Precaution Comments: ataxic LEs, fall risk Restrictions Weight Bearing Restrictions: No   Therapy/Group: Individual Therapy   Excell Seltzer, PT, DPT, CSRS  12/26/2020, 12:16 PM

## 2020-12-27 NOTE — Progress Notes (Signed)
Occupational Therapy Session Note  Patient Details  Name: Terry Mitchell MRN: [id number] Date of Birth: 19-Aug-1950  Today's Date: 12/27/2020 OT Individual Time: 0800-0830 OT Individual Time Calculation (min): 30 min    Short Term Goals: Week 1:  OT Short Term Goal 1 (Week 1): Pt will complete toilet/BSC transfers with LRAD with Mod A consistently OT Short Term Goal 1 - Progress (Week 1): Met OT Short Term Goal 2 (Week 1): Pt will complete sit <> stands with Min A at LRAD in prep for ADL OT Short Term Goal 2 - Progress (Week 1): Met OT Short Term Goal 3 (Week 1): Pt will stand for 1-3 mins with unilateral support to improve LB ADL OT Short Term Goal 3 - Progress (Week 1): Met OT Short Term Goal 4 (Week 1): Pt will complete LB dressing w/ no more than Min A OT Short Term Goal 4 - Progress (Week 1): Met Week 2:  OT Short Term Goal 1 (Week 2): Pt will complete toileting tasks with min A OT Short Term Goal 2 (Week 2): Pt will perform toilet tranfsers with supervision OT Short Term Goal 3 (Week 2): Pt will pull pants over hips standing with CGA  Skilled Therapeutic Interventions/Progress Updates:    Pt received in bed in room and consented to OT tx. Pt seen for morning ADL routine including functional transfer training, oral hygiene, dressing, and washing up sink side. Pt completed lateral scoot transfer from EOB to w/c with CGA. Pt wheeled self to sink and completed oral care with setup and washed up UB sink side with supervision. Pt req CGA for transfer to DA  [facility] with cuing for safety as R w/c brake was not locked all the way prior to lateral scoot transfer. Pt able to weight shirt to manage clothing while seated with SUP. Pt completed LB dressing and bathing while on commode with setup and CGA during standing aspects of ADLs. Pt donned footwear with setup assist while seated in w/c. After tx, pt left up in w/c with alarm on and all needs met, handed off to PT.  Therapy  Documentation Precautions:  Precautions Precautions: Fall Precaution Comments: ataxic LEs, fall risk Restrictions Weight Bearing Restrictions: No Vital Signs: Therapy Vitals Temp: 98.2 F (36.8 C) Temp Source: Oral Pulse Rate: 69 Resp: 17 BP: 125/68 Patient Position (if appropriate): Lying Oxygen Therapy SpO2: 100 % O2 Device: Room Air Pain: Pain Assessment Pain Scale: 0-10 Pain Score: 4  Pain Type: Chronic pain Pain Location: Back Pain Orientation: Lower Pain Descriptors / Indicators: Aching Pain Frequency: Intermittent Pain Onset: On-going Patients Stated Pain Goal: 0 Pain Intervention(s): Medication (See eMAR)   Therapy/Group: Individual Therapy  Vibhav Waddill 12/27/2020, 8:06 AM

## 2020-12-27 NOTE — Progress Notes (Signed)
PROGRESS NOTE   Subjective/Complaints:   Done with radiation- no issues- doesn't know when he's leaving- asking about that.  Also pain stable at ~ 4/10- not taking prn pain meds, just scheduled pain meds.    ROS:  Pt denies SOB, abd pain, CP, N/V/C/D, and vision changes    Objective:   No results found. Recent Labs    12/26/20 0647  WBC 7.3  HGB 12.2*  HCT 37.1*  PLT 138*   Recent Labs    12/26/20 0647  NA 133*  K 4.6  CL 99  CO2 28  GLUCOSE 104*  BUN 32*  CREATININE 1.18  CALCIUM 8.6*    Intake/Output Summary (Last 24 hours) at 12/27/2020 1019 Last data filed at 12/27/2020 0700 Gross per 24 hour  Intake 650 ml  Output 750 ml  Net -100 ml        Physical Exam: Vital Signs Blood pressure 125/68, pulse 69, temperature 98.2 F (36.8 C), temperature source Oral, resp. rate 17, weight 90.6 kg, SpO2 100 %.   General: awake, alert, appropriate, laying in bed;  NAD HENT: conjugate gaze; oropharynx moist CV: regular rate; no JVD Pulmonary: CTA B/L; no W/R/R- good air movement GI: soft, NT, ND, (+)BS Psychiatric: appropriate, but very flat Neurological: alert Musculoskeletal:  General: No swelling.  Cervical back: Normal range of motionand neck supple.  Comments: Pt's strength in UEs 5/5 B/L in biceps, triceps, WE, grip and finger abd B/L LEs: RLE- HF 4-/5, KE 4-/5, DF 4+/5, PF 4-/5 LLE: HF 3/5, KE 4-/5, DF 3+/5, and PF 4-/5- no change Skin: General: Skin is warmand dry. Skin looks great except has radiation mark as well as fungal toenails/very thickened  Neurological:  Mental Status: He is alertand oriented to person, place, and time.  Sensory: Sensory deficitpresent.  Motor: [place].  Comments: Speech clear. Sensory deficits from waist down with paraplegia.  Sensation decreased a lot from T7 on down to S5 B/L    Assessment/Plan: 1. Functional  deficits which require 3+ hours per day of interdisciplinary therapy in a comprehensive inpatient rehab setting.  Physiatrist is providing close team supervision and 24 hour management of active medical problems listed below.  Physiatrist and rehab team continue to assess barriers to discharge/monitor patient progress toward functional and medical goals  Care Tool:  Bathing    Body parts bathed by patient: Right arm,Left arm,Chest,Abdomen,Front perineal area,Buttocks,Right upper leg,Left upper leg,Right lower leg,Left lower leg,Face         Bathing assist Assist Level: Contact Guard/Touching assist     Upper Body Dressing/Undressing Upper body dressing   What is the patient wearing?: Pull over shirt    Upper body assist Assist Level: Independent    Lower Body Dressing/Undressing Lower body dressing      What is the patient wearing?: Underwear/pull up,Pants     Lower body assist Assist for lower body dressing: Contact Guard/Touching assist     Toileting Toileting    Toileting assist Assist for toileting: Contact Guard/Touching assist     Transfers Chair/bed transfer  Transfers assist     Chair/bed transfer assist level: Minimal Assistance - Patient > 75%     Locomotion  Ambulation   Ambulation assist      Assist level: Minimal Assistance - Patient > 75% Assistive device: Walker-rolling Max distance: 25'   Walk 10 feet activity   Assist     Assist level: Minimal Assistance - Patient > 75% Assistive device: Walker-rolling   Walk 50 feet activity   Assist Walk 50 feet with 2 turns activity did not occur: Safety/medical concerns         Walk 150 feet activity   Assist Walk 150 feet activity did not occur: Safety/medical concerns         Walk 10 feet on uneven surface  activity   Assist Walk 10 feet on uneven surfaces activity did not occur: Safety/medical concerns         Wheelchair     Assist Will patient use wheelchair  at discharge?: Yes Type of Wheelchair: Manual    Wheelchair assist level: Supervision/Verbal cueing Max wheelchair distance: 150'    Wheelchair 50 feet with 2 turns activity    Assist        Assist Level: Supervision/Verbal cueing   Wheelchair 150 feet activity     Assist      Assist Level: Supervision/Verbal cueing   Blood pressure 125/68, pulse 69, temperature 98.2 F (36.8 C), temperature source Oral, resp. rate 17, weight 90.6 kg, SpO2 100 %.  Medical Problem List and Plan: 1.T6 paraplegia ASIA Dsecondary to mets to Spinal cord/tumors compressing [place]/prostate CA -patient may shower -ELOS/Goals:10-14 days- goals supervision  Continue PT and OT- Radiation to actually finish Monday- they changed it.    con't PT and OT- asking if will walk "again"- explained we cannot tell, but he's walking some with RW- which is better than expected.   -con't PT and OT- - don't with radiation 2. Antithrombotics: -DVT/anticoagulation:Pharmaceutical:Lovenox -will need for at least 2 months due to [place], no matter how far he's walking -antiplatelet therapy: N/a 3. Pain Management:MS contin bid with oxycodone prn.Continue and change as required  6/6- not taking prns- just scheduled pain meds- con't regimen 4. Mood:LCSW to follow for evaluation and support. -antipsychotic agents: N/a 5. Neuropsych: This patientiscapable of making decisions on hisown behalf. 6. Skin/Wound Care:Routine pressure relief measures. 7. Fluids/Electrolytes/Nutrition:Monitor I/O. Check lytes in am.  8. HTN: Monitor BP tid--continue Norvasc.  --ResumeCozaarif BP continues to be labile  6/1- BP 139/79 this AM- overall borderline high- will wait to restart Cozaar- Of note, HR is 57 this AM  6/2- will restart Cozaar- 25 mg daily- will restart since BP is still elevated in 150s.   6/7- BP well controlled- con't regimen 9.  Metastatic prostate cancer to spine: XRT ongoing --slow steroid taper--4 mg tid X 3 days followed by bid X 7 days-->daily X 7 days, then 2 mg daily X 7d days.  10. At risk for spasticity and neurogenic bowel and bladder- will scan bladder to make sure emptying and monitor bowels.  6/1- Pt reports small BM overnight- con't to monitor for urinary retention- as well as con't bowel meds for constipation.   6/3- LBM overnight- con't meds  6/4-6/5: continue laxatives  6/6- refusing laxatives, but still bleeding hemorrhoids con't to encourage usage.   6/7- LBM early this AM- con't regimen 11. L foot drop  6/1- will make sure has PRAFO ordered for L and wear at night.  12. Hyperkalemia  6/2- will give dose of Lokelma 10G x1- and will recheck in AM  6/3- K+ down to 5.0- will recheck Monday- might need  Lokelma more frequently.   6/6- K+ 4.6 today-con't to monitor  6/7- will recheck Thursday 13. Hemorrhoids  6/3- will order Tucks and Anusol suppository since bleeding with BMs.     6/6- refusing miralax as well as anusol suppository even though he's still bleeding.  LOS: 7 days A FACE TO FACE EVALUATION WAS PERFORMED  Vernecia Umble 12/27/2020, 10:19 AM

## 2020-12-27 NOTE — Patient Care Conference (Signed)
Inpatient RehabilitationTeam Conference and Plan of Care Update Date: 12/27/2020   Time: 11:17 AM    Patient Name: Terry Mitchell      Medical Record Number: [id number]  Date of Birth: 09/22/1950 Sex: Male         Room/Bed: 4W16C/4W16C-01 Payor Info: Payor: [employer] MEDICARE / Plan: [employer] / Product Type: *No Product type* /    Admit Date/Time:  12/20/2020  2:31 PM  Primary Diagnosis:  Acute incomplete paraplegia [facility])  Hospital Problems: Principal Problem:   Acute incomplete paraplegia [facility]) Active Problems:   Prostate cancer metastatic to bone [facility])    Expected Discharge Date: Expected Discharge Date: 01/12/21  Team Members Present: Physician leading conference: Dr. Courtney Heys Care Coodinator Present: Loralee Pacas, LCSWA;Alfrieda Tarry Creig Hines, RN, BSN, CRRN Nurse Present: Dorthula Nettles, RN PT Present: Excell Seltzer, PT OT Present: Roanna Epley, COTA;Jennifer Tamala Julian, OT PPS Coordinator present : Ileana Ladd, PT     Current Status/Progress Goal Weekly Team Focus  Bowel/Bladder   Continent B&B, miralax bid-usually refuses HS dose. Hemorrhoids-Anusol HC supp. changed to PRN because of refusals.  Remain continent of B&B  assess for constipation & worsening of hemorrhoids.   Swallow/Nutrition/ Hydration             ADL's   bathing/dressing-CGA for standing; functional tranfsers-CGA for squat pivot, min A for SPT, toileting-min A  supervision overall  functional transfers, standing balance, toileting, safety awareness, education   Mobility   CGA squat pivot transfer, min A SPT with RW, min A gait x 26 ft with RW (ataxic), Supervision w/c mobility  Supervision transfers, min A gait and stairs  LE NMR, gait training   Communication             Safety/Cognition/ Behavioral Observations            Pain   Denies pain  pain free or managed pain  assess pain Qshift & before therapies   Skin   no skin issues  skin remains intact  assess every shift and PRN      Discharge Planning:  D/c to home with 24/7 care from various family members. Last radiation tx on 6/6.   Team Discussion: Only taking scheduled pain medications, refusing bowel medications for hemorrhoids. Continent B/B, complains of pain to his back but scheduled medications work well. Should have intermittent support at discharge. Patient on target to meet rehab goals: yes, contact guard to squat pivot transfer, walks 30 ft with RW, W/C supervision to mod I. Supervision goals, lower body dressing, lateral leans contact guard, standing balance is progressing.  *See Care Plan and progress notes for long and short-term goals.   Revisions to Treatment Plan:  Following his hyperkalemia, check again on Thursday.  Teaching Needs: Family education, medication management, pain management, transfer training, gait training, balance training, endurance training, stair training, safety awareness.  Current Barriers to Discharge: Decreased caregiver support, Medical stability, Home enviroment access/layout, Lack of/limited family support, Weight bearing restrictions, Medication compliance, Pending chemo/radiation and Behavior  Possible Resolutions to Barriers: Continue current medications, provide emotional support.     Medical Summary Current Status: radiation finished yesterday; continent B/B; only taking scheduled pain meds, not prns; main barrier is mobility. flat affect/somewhat depressed  Barriers to Discharge: Decreased family/caregiver support;Home enviroment access/layout;Medical stability;Pending chemo/radiation;Weight bearing restrictions  Barriers to Discharge Comments: ? intermittent support vs more: daughter just got out of hospital Possible Resolutions to [employer] Focus: walking 70ft min A RW; poor proprioception; T6 ASIA D paraplegia-  no sig. change ; hyperkalemia- following- next check TH; flat affect- doesn't want meds- ?overwhelmed- d/c 6/23   Continued Need for Acute  Rehabilitation Level of Care: The patient requires daily medical management by a physician with specialized training in physical medicine and rehabilitation for the following reasons: Direction of a multidisciplinary physical rehabilitation program to maximize functional independence : Yes Medical management of patient stability for increased activity during participation in an intensive rehabilitation regime.: Yes Analysis of laboratory values and/or radiology reports with any subsequent need for medication adjustment and/or medical intervention. : Yes   I attest that I was present, lead the team conference, and concur with the assessment and plan of the team.   Cristi Loron 12/27/2020, 5:05 PM

## 2020-12-27 NOTE — Progress Notes (Signed)
Physical Therapy Session Note  Patient Details  Name: Terry Mitchell MRN: [id number] Date of Birth: 18-Dec-1950  Today's Date: 12/27/2020 PT Individual Time: 1000-1030 PT Individual Time Calculation (min): 30 min   Short Term Goals: Week 1:  PT Short Term Goal 1 (Week 1): Pt will perform least restrictive transfer with min A consistently PT Short Term Goal 2 (Week 1): Pt will initiate gait training outside of // bars with LRAD PT Short Term Goal 3 (Week 1): Pt will initiate stair training as safe and able Week 2:    Week 3:     Skilled Therapeutic Interventions/Progress Updates:    Pain:  Pt reports no pain.  Treatment to tolerance.  Rest breaks and repositioning as needed.  Pt initially oob in wc and agreeable to treatment session. wc propulsion x 173ft mod I Gait 15ft w/RW and min assist, cues to increase step length R, occasionally fails to step to fully on R.  Cues for upright posture, mild flexion tendency progresses w/fatigue. Repeated Sit to stand from chair, turning, giat x 75ft as above, turn to sit w/cues as above, apparent significant bilat proprioceptive issue w/functional mobility.  Sidestepping in parallel bars w/mirror for feedback 52ft x 4, difficulty w/LLE placement appears due to prioprioceptive deficits, L hip weakness also noted w/task.  Seated hip abduction w/L2 orange theraband resistance x 10 Hip abduction w/resistance as above + Sit to stand and stand to sit maintaining resistance, repeated x 6.  wc propulsion x 14ft to room w/supervision. Pt left oob in wc w/alarm belt set and needs in reach   Therapy Documentation Precautions:  Precautions Precautions: Fall Precaution Comments: ataxic LEs, fall risk Restrictions Weight Bearing Restrictions: No   Therapy/Group: Individual Therapy  Callie Fielding, [place] 12/27/2020, 12:17 PM

## 2020-12-27 NOTE — Progress Notes (Signed)
Patient ID: Terry Mitchell, male   DOB: 1951-04-21, 70 y.o.   MRN: [id number]  SW met with pt in room to provide updates from team conference, and d/c date 6/23. Pt aware SW will follow-up with his dtr [name].   SW called pt dtr [name] [phone]) to provide above updates. SW discussed pt goal of supervision level of care due to balance and safety at d/c and will require 24/7 care. Reports she will work on making sure pt has the support he needs. SW also discussed family education. She reports that she will speak with her brother. SW informed there will be follow-up next week with updates on team conference.   Loralee Pacas, MSW, [place] Office: [phone] Cell: [phone] Fax: [phone]

## 2020-12-27 NOTE — Progress Notes (Signed)
Occupational Therapy Weekly Progress Note  Patient Details  Name: Terry Mitchell MRN: [id number] Date of Birth: 05/31/1951  Beginning of progress report period: December 21, 2020 End of progress report period: December 27, 2020  Patient has met 4 of 4 short term goals.  Pt is making steady progress with BADLs and functional transfers. Pt completes bathing tasks at shower level with min A for standing to bathe buttocks. UB dressing with setup. LB dressing with min A standing to pull pants over hips. Squat pivot transfers with CGA and stand pivot transfers with min A.  Patient continues to demonstrate the following deficits: muscle weakness, abnormal tone and unbalanced muscle activation and decreased standing balance, decreased postural control and decreased balance strategies and therefore will continue to benefit from skilled OT intervention to enhance overall performance with BADL and iADL.  Patient progressing toward long term goals..  Continue plan of care.  OT Short Term Goals Week 1:  OT Short Term Goal 1 (Week 1): Pt will complete toilet/BSC transfers with LRAD with Mod A consistently OT Short Term Goal 1 - Progress (Week 1): Met OT Short Term Goal 2 (Week 1): Pt will complete sit <> stands with Min A at LRAD in prep for ADL OT Short Term Goal 2 - Progress (Week 1): Met OT Short Term Goal 3 (Week 1): Pt will stand for 1-3 mins with unilateral support to improve LB ADL OT Short Term Goal 3 - Progress (Week 1): Met OT Short Term Goal 4 (Week 1): Pt will complete LB dressing w/ no more than Min A OT Short Term Goal 4 - Progress (Week 1): Met Week 2:  OT Short Term Goal 1 (Week 2): Pt will complete toileting tasks with min A OT Short Term Goal 2 (Week 2): Pt will perform toilet tranfsers with supervision OT Short Term Goal 3 (Week 2): Pt will pull pants over hips standing with CGA   Lanier, Thomas Chappell 12/27/2020, 6:51 AM  

## 2020-12-27 NOTE — Progress Notes (Signed)
Physical Therapy Session Note  Patient Details  Name: Terry Mitchell MRN: [id number] Date of Birth: 11-06-1950  Today's Date: 12/27/2020 PT Individual Time: 0830-0930; 1130-1200 PT Individual Time Calculation (min): 60 min and 30 min  Short Term Goals: Week 1:  PT Short Term Goal 1 (Week 1): Pt will perform least restrictive transfer with min A consistently PT Short Term Goal 2 (Week 1): Pt will initiate gait training outside of // bars with LRAD PT Short Term Goal 3 (Week 1): Pt will initiate stair training as safe and able  Skilled Therapeutic Interventions/Progress Updates:    Session 1: Pt received seated in w/c in room handed off from OT session, agreeable to PT session. No complaints of pain. Pt is Supervision for w/c mobility throughout session and able to manage w/c parts with min cueing. Sit to stand with min A to RW throughout session. Session focus on gait training with RW and min A overall needed for balance. Ambulation 2 x 25 ft with no weights on ankles and mirror for visual feedback, 2 x 25 ft with 4# ankle weights and mirror for visual feedback, 2 x 25 ft with mirror and B DF assist ACE wrap. Pt continues to exhibit ataxic LE with scissoring of LLE > RLE, L toe drag that increases with fatigue, decreased heel strike B, and decreased RLE step length. Standing alt L/R cane step-overs with mirror for visual feedback, focus on increasing step length and improved control of LE placement. Ambulation x 10 ft with RW and min A following practice to assess carryover of improved RLE step length. Pt exhibits fair ability to increase RLE step length during gait. Pt will benefit from ongoing gait training to improve safety and balance. Pt left seated in w/c in room with needs in reach, chair alarm in place at end of session.  Session 2: Pt received seated in w/c in room, agreeable to PT session. Manual w/c propulsion 2 x 150 ft with use of BUE at Supervision level. Squat pivot transfer w/c  to/from mat table with CGA. Sit to stand with min A to RW. Static standing balance with one UE support on RW performing ball toss against rebounder, 3 x 15 reps with min to mod A for standing balance. Attempt ball toss with use of BUE and no UE support on RW, pt unable to safely maintain standing balance. Alt L/R 3" step-ups with BUE support and min A for balance, 3 x 10 reps B. Pt has onset of L knee buckling with onset of fatigue, requires cues for safety and to take a rest break. Pt left seated in w/c in room with needs in reach, chair alarm in place at end of session.  Therapy Documentation Precautions:  Precautions Precautions: Fall Precaution Comments: ataxic LEs, fall risk Restrictions Weight Bearing Restrictions: No   Therapy/Group: Individual Therapy   Excell Seltzer, PT, DPT, CSRS  12/27/2020, 10:26 AM

## 2020-12-27 NOTE — Progress Notes (Signed)
Occupational Therapy Session Note  Patient Details  Name: Terry Mitchell MRN: [id number] Date of Birth: [birth year]  Today's Date: 12/27/2020 OT Individual Time: 1030-1059 OT Individual Time Calculation (min): 29 min    Short Term Goals: Week 2:  OT Short Term Goal 1 (Week 2): Pt will complete toileting tasks with min A OT Short Term Goal 2 (Week 2): Pt will perform toilet tranfsers with supervision OT Short Term Goal 3 (Week 2): Pt will pull pants over hips standing with CGA  Skilled Therapeutic Interventions/Progress Updates:    Pt resting in w/c upon arrival. OT intervention with focus on w/c mobility, squat pivot transfers, sit<>stand, and standing balance to increase independence with BADLs. W/c mobility with supervision. Squat pivot transfers with CGA and min verbal cues for safety. Sit<>stand from EOM with CGA. Standing balance with min A when tossing bean bags for [employer]. Pt stood at table to clean bean bags with BUE and standing unsupported-min A. Pt returned to w/c and propelled w/c to room. Pt remained in w/c with all needs within reach. Seat alarm activated.   Therapy Documentation Precautions:  Precautions Precautions: Fall Precaution Comments: ataxic LEs, fall risk Restrictions Weight Bearing Restrictions: No Pain: Pain Assessment Pain Scale: 0-10 Pain Score: 4  Pain Type: Chronic pain Pain Location: Back Pain Orientation: Lower Pain Descriptors / Indicators: Aching Pain Frequency: Intermittent Pain Onset: On-going Patients Stated Pain Goal: 0 Pain Intervention(s): Medication (See eMAR)   Therapy/Group: Individual Therapy  Leroy Libman 12/27/2020, 11:00 AM

## 2020-12-28 MED ORDER — CITALOPRAM HYDROBROMIDE 10 MG PO TABS
10.0000 mg | ORAL_TABLET | Freq: Every day | ORAL | Status: DC
  Filled 2020-12-28 (×2): qty 1

## 2020-12-28 NOTE — Progress Notes (Signed)
PROGRESS NOTE   Subjective/Complaints:   Per staff, they think pt overwhelmed by everything- and appears very flat/depressed- will start Celexa 10 mg daily and if tolerates, will increase.   Pt denies any issues.  Hears by putting controller next to his ear- denies HOH.   ROS:  Pt denies SOB, abd pain, CP, N/V/C/D, and vision changes   Objective:   No results found. Recent Labs    12/26/20 0647  WBC 7.3  HGB 12.2*  HCT 37.1*  PLT 138*   Recent Labs    12/26/20 0647  NA 133*  K 4.6  CL 99  CO2 28  GLUCOSE 104*  BUN 32*  CREATININE 1.18  CALCIUM 8.6*    Intake/Output Summary (Last 24 hours) at 12/28/2020 0938 Last data filed at 12/28/2020 0700 Gross per 24 hour  Intake 820 ml  Output --  Net 820 ml        Physical Exam: Vital Signs Blood pressure (!) 153/92, pulse (!) 55, temperature 97.9 F (36.6 C), temperature source Oral, resp. rate 18, weight 90.6 kg, SpO2 99 %.    General: awake, alert, appropriate, sitting up in bed with controlled next to ear; NAD HENT: conjugate gaze; oropharynx moist CV: regular rate; no JVD Pulmonary: CTA B/L; no W/R/R- good air movement GI: soft, NT, ND, (+)BS Psychiatric: appropriate but VERY flat Neurological: alert  Musculoskeletal:  General: No swelling.  Cervical back: Normal range of motionand neck supple.  Comments: Pt's strength in UEs 5/5 B/L in biceps, triceps, WE, grip and finger abd B/L LEs: RLE- HF 4-/5, KE 4-/5, DF 4+/5, PF 4-/5 LLE: HF 3/5, KE 4-/5, DF 3+/5, and PF 4-/5- no change Skin: General: Skin is warmand dry. Skin looks great except has radiation mark as well as fungal toenails/very thickened  Neurological:  Mental Status: He is alertand oriented to person, place, and time.  Sensory: Sensory deficitpresent.  Motor: [place].  Comments: Speech clear. Sensory deficits from waist down with  paraplegia.  Sensation decreased a lot from T7 on down to S5 B/L    Assessment/Plan: 1. Functional deficits which require 3+ hours per day of interdisciplinary therapy in a comprehensive inpatient rehab setting.  Physiatrist is providing close team supervision and 24 hour management of active medical problems listed below.  Physiatrist and rehab team continue to assess barriers to discharge/monitor patient progress toward functional and medical goals  Care Tool:  Bathing    Body parts bathed by patient: Right arm,Left arm,Chest,Abdomen,Front perineal area,Buttocks,Right upper leg,Left upper leg,Right lower leg,Left lower leg,Face         Bathing assist Assist Level: Contact Guard/Touching assist     Upper Body Dressing/Undressing Upper body dressing   What is the patient wearing?: Pull over shirt    Upper body assist Assist Level: Independent    Lower Body Dressing/Undressing Lower body dressing      What is the patient wearing?: Underwear/pull up,Pants     Lower body assist Assist for lower body dressing: Contact Guard/Touching assist     Toileting Toileting    Toileting assist Assist for toileting: Contact Guard/Touching assist     Transfers Chair/bed transfer  Transfers assist  Chair/bed transfer assist level: Minimal Assistance - Patient > 75%     Locomotion Ambulation   Ambulation assist      Assist level: Minimal Assistance - Patient > 75% Assistive device: Walker-rolling Max distance: 25'   Walk 10 feet activity   Assist     Assist level: Minimal Assistance - Patient > 75% Assistive device: Walker-rolling   Walk 50 feet activity   Assist Walk 50 feet with 2 turns activity did not occur: Safety/medical concerns         Walk 150 feet activity   Assist Walk 150 feet activity did not occur: Safety/medical concerns         Walk 10 feet on uneven surface  activity   Assist Walk 10 feet on uneven surfaces activity  did not occur: Safety/medical concerns         Wheelchair     Assist Will patient use wheelchair at discharge?: Yes Type of Wheelchair: Manual    Wheelchair assist level: Supervision/Verbal cueing Max wheelchair distance: 150'    Wheelchair 50 feet with 2 turns activity    Assist        Assist Level: Supervision/Verbal cueing   Wheelchair 150 feet activity     Assist      Assist Level: Supervision/Verbal cueing   Blood pressure (!) 153/92, pulse (!) 55, temperature 97.9 F (36.6 C), temperature source Oral, resp. rate 18, weight 90.6 kg, SpO2 99 %.  Medical Problem List and Plan: 1.T6 paraplegia ASIA Dsecondary to mets to Spinal cord/tumors compressing [place]/prostate CA -patient may shower -ELOS/Goals:10-14 days- goals supervision  Continue PT and OT- Radiation to actually finish Monday- they changed it.    con't PT and OT- asking if will walk "again"- explained we cannot tell, but he's walking some with RW- which is better than expected.   con't PT and OT- done with radiation 2. Antithrombotics: -DVT/anticoagulation:Pharmaceutical:Lovenox -will need for at least 2 months due to [place], no matter how far he's walking -antiplatelet therapy: N/a 3. Pain Management:MS contin bid with oxycodone prn.Continue and change as required  6/6- not taking prns- just scheduled pain meds- con't regimen  6/8- pain 4/10- controlled per pt- con't regimen- not taking prns.  4. Mood:LCSW to follow for evaluation and support. -antipsychotic agents: N/a 5. Neuropsych: This patientiscapable of making decisions on hisown behalf. 6. Skin/Wound Care:Routine pressure relief measures. 7. Fluids/Electrolytes/Nutrition:Monitor I/O. Check lytes in am.  8. HTN: Monitor BP tid--continue Norvasc.  --ResumeCozaarif BP continues to be labile  6/1- BP 139/79 this AM- overall borderline high- will wait to  restart Cozaar- Of note, HR is 57 this AM  6/2- will restart Cozaar- 25 mg daily- will restart since BP is still elevated in 150s.   6/7- BP well controlled- con't regimen  6/8- BP 150s/90s this AM- usually controlled- will monitor for trend.  9. Metastatic prostate cancer to spine: XRT ongoing --slow steroid taper--4 mg tid X 3 days followed by bid X 7 days-->daily X 7 days, then 2 mg daily X 7d days.  10. At risk for spasticity and neurogenic bowel and bladder- will scan bladder to make sure emptying and monitor bowels.  6/1- Pt reports small BM overnight- con't to monitor for urinary retention- as well as con't bowel meds for constipation.   6/3- LBM overnight- con't meds  6/4-6/5: continue laxatives  6/6- refusing laxatives, but still bleeding hemorrhoids con't to encourage usage.   6/7- LBM early this AM- con't regimen 11. L foot drop  6/1- will make sure has PRAFO ordered for L and wear at night.  12. Hyperkalemia  6/2- will give dose of Lokelma 10G x1- and will recheck in AM  6/3- K+ down to 5.0- will recheck Monday- might need Lokelma more frequently.   6/6- K+ 4.6 today-con't to monitor  6/7- will recheck Thursday 13. Hemorrhoids  6/3- will order Tucks and Anusol suppository since bleeding with BMs.     6/6- refusing miralax as well as anusol suppository even though he's still bleeding. 14. Depressed mood/flat  6/8- will start Celexa 10 mg daily and monitor for side effects.   LOS: 8 days A FACE TO FACE EVALUATION WAS PERFORMED  Akhil Piscopo 12/28/2020, 9:38 AM

## 2020-12-28 NOTE — Plan of Care (Signed)
  Problem: RH Dressing Goal: LTG Patient will perform upper body dressing (OT) Description: LTG Patient will perform upper body dressing with assist, with/without cues (OT). Flowsheets (Taken 12/28/2020 1427) LTG: Pt will perform upper body dressing with assistance level of: (Upgraded JLS) Independent Note: Upgraded JLS   Problem: RH Toileting Goal: LTG Patient will perform toileting task (3/3 steps) with assistance level (OT) Description: LTG: Patient will perform toileting task (3/3 steps) with assistance level (OT)  Flowsheets (Taken 12/28/2020 1427) LTG: Pt will perform toileting task (3/3 steps) with assistance level: (Upgraded JLS) Independent with assistive device Note: Upgraded JLS   Problem: RH Toilet Transfers Goal: LTG Patient will perform toilet transfers w/assist (OT) Description: LTG: Patient will perform toilet transfers with assist, with/without cues using equipment (OT) Flowsheets (Taken 12/28/2020 1427) LTG: Pt will perform toilet transfers with assistance level of: (Upgraded JLS) Independent with assistive device Note: Upgraded JLS

## 2020-12-28 NOTE — Progress Notes (Signed)
Physical Therapy Weekly Progress Note  Patient Details  Name: Terry Mitchell MRN: [id number] Date of Birth: Apr 20, 1951  Beginning of progress report period: December 21, 2020 End of progress report period: December 28, 2020  Today's Date: 12/28/2020     Patient has met 3 of 3 short term goals.  Pt is making good progress towards therapy goals. Pt is Supervision for bed mobility with use of bedrail, CGA for squat pivot transfers, min A for stand pivot transfers with RW, is Supervision for w/c mobility up to 150 ft with use of BUE and is at Supervision level for management of wheelchair parts. Pt is able to ambulate close to 30 ft with RW and min A but exhibits ongoing ataxic limbs (L>R) and impaired proprioception which impairs safety and balance with all standing activity. He is able to navigate 3" stairs with 2 handrails and mod A with occasional LE buckling. Pt remains very motivated and exhibits great participation in therapy sessions.  Patient continues to demonstrate the following deficits muscle weakness, decreased cardiorespiratoy endurance, impaired timing and sequencing, abnormal tone, unbalanced muscle activation, ataxia, and decreased coordination, and decreased standing balance, decreased postural control, and decreased balance strategies and therefore will continue to benefit from skilled PT intervention to increase functional independence with mobility.  Patient progressing toward long term goals..  Plan of care revisions: upgraded transfer goal to mod I due to great progress.  PT Short Term Goals Week 1:  PT Short Term Goal 1 (Week 1): Pt will perform least restrictive transfer with min A consistently PT Short Term Goal 1 - Progress (Week 1): Met PT Short Term Goal 2 (Week 1): Pt will initiate gait training outside of // bars with LRAD PT Short Term Goal 2 - Progress (Week 1): Met PT Short Term Goal 3 (Week 1): Pt will initiate stair training as safe and able PT Short Term Goal 3 -  Progress (Week 1): Met Week 2:  PT Short Term Goal 1 (Week 2): Pt will ambulate x 100 ft with LRAD and min A PT Short Term Goal 2 (Week 2): Pt will perform least restrictive transfer with Supervision consistently PT Short Term Goal 3 (Week 2): Pt will navigate 6" stairs with one handrail and min A   Therapy Documentation Precautions:  Precautions Precautions: Fall Precaution Comments: ataxic LEs, fall risk Restrictions Weight Bearing Restrictions: No   Therapy/Group: Individual Therapy   Excell Seltzer, PT, DPT, CSRS  12/28/2020, 7:48 AM

## 2020-12-28 NOTE — Progress Notes (Signed)
Occupational Therapy Session Note  Patient Details  Name: Terry Mitchell MRN: [id number] Date of Birth: 06/29/1951  Today's Date: 12/28/2020 OT Individual Time: 1300-1412 OT Individual Time Calculation (min): 72 min    Short Term Goals: Week 1:  OT Short Term Goal 1 (Week 1): Pt will complete toilet/BSC transfers with LRAD with Mod A consistently OT Short Term Goal 1 - Progress (Week 1): Met OT Short Term Goal 2 (Week 1): Pt will complete sit <> stands with Min A at LRAD in prep for ADL OT Short Term Goal 2 - Progress (Week 1): Met OT Short Term Goal 3 (Week 1): Pt will stand for 1-3 mins with unilateral support to improve LB ADL OT Short Term Goal 3 - Progress (Week 1): Met OT Short Term Goal 4 (Week 1): Pt will complete LB dressing w/ no more than Min A OT Short Term Goal 4 - Progress (Week 1): Met Week 2:  OT Short Term Goal 1 (Week 2): Pt will complete toileting tasks with min A OT Short Term Goal 2 (Week 2): Pt will perform toilet tranfsers with supervision OT Short Term Goal 3 (Week 2): Pt will pull pants over hips standing with CGA   Skilled Therapeutic Interventions/Progress Updates:    Pt greeted at time of session sitting up in chair agreeable to OT session, no pain throughout. Self propel room > laundry room as he wanted to dry clothes from earlier session today. Sit <> stand at washer with CGA and able to reach clothing items in bottom of washer and place in dryer, Min A for full reach in standing. Self propel > gym and 2x20 rebounder tosses once seated and once standing with 4kg ball seated and lightweight ball in standing with unilateral support. squat pivot > mat CGA and performed several sit <> stands CGA throughout session. In standing, GMC activity hitting various colored objects, better performance with R>L and 1 LOB to R/posterior with scissoring noted and quickly returned to sitting. Standing intervals approx 1-2 minutes each round, 4 rounds and fatigue noted. Weight  bearing activity reaching FWD and reaching for objects with RUE placing on L side. Squat pivot back to wheelchair CGA. NUSTEP level 5 for 8  minutes as well for cardiorespiratory endurance and BUE/BLE coordination and strengthening. Self propel back to room, therapist retrieving clothes from laundry for time. Set up with call bell in reach alarm on all needs met.   Therapy Documentation Precautions:  Precautions Precautions: Fall Precaution Comments: ataxic LEs, fall risk Restrictions Weight Bearing Restrictions: No     Therapy/Group: Individual Therapy   C  12/28/2020, 7:24 AM 

## 2020-12-28 NOTE — Progress Notes (Signed)
Physical Therapy Session Note  Patient Details  Name: Terry Mitchell MRN: [id number] Date of Birth: 11/10/1950  Today's Date: 12/28/2020 PT Individual Time: 1545-1700 PT Individual Time Calculation (min): 75 min   Short Term Goals: Week 1:  PT Short Term Goal 1 (Week 1): Pt will perform least restrictive transfer with min A consistently PT Short Term Goal 2 (Week 1): Pt will initiate gait training outside of // bars with LRAD PT Short Term Goal 3 (Week 1): Pt will initiate stair training as safe and able  Skilled Therapeutic Interventions/Progress Updates:    Pt received seated in w/c in room, agreeable to PT session. No complaints of pain. Manual w/c propulsion to/from therapy gym at Supervision level with use of BUE. Pt is at Supervision level for management of w/c parts. Squat pivot transfer to mat table with CGA. Sit to stand with min A to RW throughout session. Ambulation 2 x 25 ft with RW and min A for balance, use of mirror for visual feedback due to ongoing proprioceptive deficits in LE. Pt continues to exhibit decreased step length with RLE and decreased weight shift onto LLE during gait. Ascend/descend 8 x 3" stairs with 2 handrails and mod A for balance, occasional LE buckling while descending stairs. Standing alt L/R 3" step-ups with BUE support and min A for balance, manual cues for increased knee control, 2 x 5 reps each. Progressing to alt L/R 3" step-throughs to next step with min A and BUE support, x 10 reps each again with manual cues for increased knee control. Standing alt L/R target taps with RW and min A for balance with focus on improving LE proprioception and control to reach targets accurately. Static standing balance with no UE support and min A while holding exercise ball and performing chest press, 3 x 15 reps. Pt noted to have increased weight shift onto RLE with onset of fatigue, unable to correct with manual assist without losing balance. Pt left seated in w/c in room  with needs in reach, chair alarm in place at end of session.  Therapy Documentation Precautions:  Precautions Precautions: Fall Precaution Comments: ataxic LEs, fall risk Restrictions Weight Bearing Restrictions: (P) No   Therapy/Group: Individual Therapy   Excell Seltzer, PT, DPT, CSRS  12/28/2020, 5:51 PM

## 2020-12-28 NOTE — Progress Notes (Signed)
Occupational Therapy Session Note  Patient Details  Name: Terry Mitchell MRN: [id number] Date of Birth: August 11, 1950  Today's Date: 12/28/2020 OT Individual Time: 1045-1130 OT Individual Time Calculation (min): 45 min    Short Term Goals: Week 1:  OT Short Term Goal 1 (Week 1): Pt will complete toilet/BSC transfers with LRAD with Mod A consistently OT Short Term Goal 1 - Progress (Week 1): Met OT Short Term Goal 2 (Week 1): Pt will complete sit <> stands with Min A at LRAD in prep for ADL OT Short Term Goal 2 - Progress (Week 1): Met OT Short Term Goal 3 (Week 1): Pt will stand for 1-3 mins with unilateral support to improve LB ADL OT Short Term Goal 3 - Progress (Week 1): Met OT Short Term Goal 4 (Week 1): Pt will complete LB dressing w/ no more than Min A OT Short Term Goal 4 - Progress (Week 1): Met Week 2:  OT Short Term Goal 1 (Week 2): Pt will complete toileting tasks with min A OT Short Term Goal 2 (Week 2): Pt will perform toilet tranfsers with supervision OT Short Term Goal 3 (Week 2): Pt will pull pants over hips standing with CGA      Skilled Therapeutic Interventions/Progress Updates:    Pt seen this session to facilitate functional mobility and strength. Pt requested to wash his clothing, pt self propelled to laundry room.  Pt stood up with min A to stand at washing machine to place clothing in washer and start washer.  Propelled to gym to transfer with RW to mat with min A.  UE strength with tricep push ups on yoga blocks 12x 3. LE strength with sit to squat focusing on legs activating 12 x3 (only using hands for light support)  theraband hip abduction 15x and theraband arm abd for sh 15x. Pt used RW to transfer back to wc and then pt returned to room. Resting in wc with alarm on and all needs met.    Therapy Documentation Precautions:  Precautions Precautions: Fall Precaution Comments: ataxic LEs, fall risk Restrictions Weight Bearing Restrictions: No    Vital  Signs: Therapy Vitals Temp: 97.9 F (36.6 C) Temp Source: Oral Pulse Rate: (!) 55 Resp: 18 BP: (!) 153/92 Patient Position (if appropriate): Sitting Oxygen Therapy SpO2: 99 % O2 Device: Room Air Pain: Pain Assessment Pain Scale: 0-10 Pain Score: 0-No pain ADL: ADL Eating: Not assessed Grooming: Setup Where Assessed-Grooming: Sitting at sink Upper Body Bathing: Setup Lower Body Bathing: Minimal assistance (lateral leans, seated in wheelchair) Where Assessed-Lower Body Bathing: Sitting at sink Upper Body Dressing: Setup Lower Body Dressing: Moderate assistance Toileting: Not assessed Toilet Transfer: Not assessed   Therapy/Group: Individual Therapy  [place] 12/28/2020, 8:46 AM

## 2020-12-29 LAB — CBC WITH DIFFERENTIAL/PLATELET
Abs Immature Granulocytes: 0.05 10*3/uL (ref 0.00–0.07)
Basophils Absolute: 0 10*3/uL (ref 0.0–0.1)
Basophils Relative: 0 %
Eosinophils Absolute: 0 10*3/uL (ref 0.0–0.5)
Eosinophils Relative: 0 %
HCT: 33.9 % — ABNORMAL LOW (ref 39.0–52.0)
Hemoglobin: 11 g/dL — ABNORMAL LOW (ref 13.0–17.0)
Immature Granulocytes: 1 %
Lymphocytes Relative: 3 %
Lymphs Abs: 0.1 10*3/uL — ABNORMAL LOW (ref 0.7–4.0)
MCH: 33.4 pg (ref 26.0–34.0)
MCHC: 32.4 g/dL (ref 30.0–36.0)
MCV: 103 fL — ABNORMAL HIGH (ref 80.0–100.0)
Monocytes Absolute: 0.4 10*3/uL (ref 0.1–1.0)
Monocytes Relative: 8 %
Neutro Abs: 5.1 10*3/uL (ref 1.7–7.7)
Neutrophils Relative %: 88 %
Platelets: UNDETERMINED 10*3/uL (ref 150–400)
RBC: 3.29 MIL/uL — ABNORMAL LOW (ref 4.22–5.81)
RDW: 13.1 % (ref 11.5–15.5)
WBC: 5.7 10*3/uL (ref 4.0–10.5)
nRBC: 0 % (ref 0.0–0.2)

## 2020-12-29 LAB — COMPREHENSIVE METABOLIC PANEL
ALT: 35 U/L (ref 0–44)
AST: 22 U/L (ref 15–41)
Albumin: 2.7 g/dL — ABNORMAL LOW (ref 3.5–5.0)
Alkaline Phosphatase: 78 U/L (ref 38–126)
Anion gap: 8 (ref 5–15)
BUN: 32 mg/dL — ABNORMAL HIGH (ref 8–23)
CO2: 26 mmol/L (ref 22–32)
Calcium: 8.6 mg/dL — ABNORMAL LOW (ref 8.9–10.3)
Chloride: 97 mmol/L — ABNORMAL LOW (ref 98–111)
Creatinine, Ser: 1.4 mg/dL — ABNORMAL HIGH (ref 0.61–1.24)
GFR, Estimated: 55 mL/min — ABNORMAL LOW (ref >60)
Glucose, Bld: 115 mg/dL — ABNORMAL HIGH (ref 70–99)
Potassium: 4.7 mmol/L (ref 3.5–5.1)
Sodium: 131 mmol/L — ABNORMAL LOW (ref 135–145)
Total Bilirubin: 0.5 mg/dL (ref 0.3–1.2)
Total Protein: 4.7 g/dL — ABNORMAL LOW (ref 6.5–8.1)

## 2020-12-29 LAB — GLUCOSE, CAPILLARY: Glucose-Capillary: 98 mg/dL (ref 70–99)

## 2020-12-29 NOTE — Progress Notes (Signed)
Recreational Therapy Assessment and Plan  Patient Details  Name: Terry Mitchell MRN: [id number] Date of Birth: 05/23/1951 Today's Date: 12/29/2020  Rehab Potential:  Good ELOS:   d/c 6/23  Assessment  Hospital Problem: Principal Problem:   Acute incomplete paraplegia (HCC) Active Problems:   Prostate cancer metastatic to bone (HCC)     Past Medical History:      Past Medical History:  Diagnosis Date   Depression      recently went on disability d/t diagnosis   History of recent fall 01/2018    missed the last step on ladder, causing back discomfort   Hypertension     Prostate cancer (HCC) 12/2017    prostate    Past Surgical History:       Past Surgical History:  Procedure Laterality Date   BACK SURGERY   2001     2 rods and 6 screws in back   COLONOSCOPY WITH PROPOFOL N/A 09/24/2019    Procedure: COLONOSCOPY WITH PROPOFOL;  Surgeon: Anna, Kiran, MD;  Location: [facility];  Service: Gastroenterology;  Laterality: N/A;   Left shoulder surgery Left 1998    removed a piece of bone around collar bone   PROSTATE BIOPSY N/A 02/28/2018    Procedure: PROSTATE BIOPSY;  Surgeon: Stoioff, Scott C, MD;  Location: [facility] ORS;  Service: Urology;  Laterality: N/A;   TRANSRECTAL ULTRASOUND N/A 02/28/2018    Procedure: TRANSRECTAL ULTRASOUND;  Surgeon: Stoioff, Scott C, MD;  Location: [facility] ORS;  Service: Urology;  Laterality: N/A;      Assessment & Plan Clinical Impression:  Terry Mitchell is a 70 year old male with history of depression, HTN, prostate cancer off treatment/followed by hospice;  who was admitted on 12/11/20 with reports of back pain for 3-4 months with BLE weakness and started developing progressive numbness from abdomen down with inability to walk. He was found to have circumferential tumor at T7/T7 with compression of spinal cord and diffuse osseous metastatic disease without pathologic Fx and largest lesion at T12. Dr. Yarborough recommended high dose steroids as well as  hem/onc/radiation input. He was evaluated by Dr. Moody and started on palliative XRT immediately.  Dr. Kale consulted for input --> CT abdomen/pelvis/chest done for staging and showed enlargening bilateral rib lesions, No PE and abdomen/pelvis without significant abnormality.   Patient revoked  hospice services and  elected on full scope of care with palliative systemic therapies as well as therapy to regain prior functional status. . Steroids to be slowly weaned off and he is to follow up with Dr. Yu on outpatient basis.   Therapy ongoing and patient continues to be limited by BLE weakness, multiple falls as well as balance deficits. CIR was consulted for progressive therapies. Patient transferred to CIR on 12/20/2020 .   Pt presents with decreased activity tolerance, decreased functional mobility, decreased balance, decreased coordination Limiting pt's independence with leisure/community pursuits.  Met with pt to discuss leisure interests, activity analysis, potential modifications, stress management & community reintegration.  Pt stated optimism about rehab efforts and stated there was always something to do to keep himself busy once home.  Pt identified riding motorcycles as a hobby that he enjoyed.  While acknowledging that he wouldn't ever be able to ride again, pt stated it was still rewarding to have someone crank it up and hear it run.  Discussed importance of social, emotional, & spiritual health & it's impact on physical health.  Pt stated understanding.    Plan  Min   1 TR sessinon >20 minutes per week during LOS  Recommendations for other services: None   Discharge Criteria: Patient will be discharged from TR if patient refuses treatment 3 consecutive times without medical reason.  If treatment goals not met, if there is a change in medical status, if patient makes no progress towards goals or if patient is discharged from hospital.  The above assessment, treatment plan, treatment  alternatives and goals were discussed and mutually agreed upon: by patient  , 12/29/2020, 3:56 PM  

## 2020-12-29 NOTE — Progress Notes (Signed)
Physical Therapy Session Note  Patient Details  Name: Terry Mitchell MRN: [id number] Date of Birth: 1951-04-08  Today's Date: 12/29/2020 PT Individual Time: 0915-1000 PT Individual Time Calculation (min): 45 min   Short Term Goals: Week 1:  PT Short Term Goal 1 (Week 1): Pt will perform least restrictive transfer with min A consistently PT Short Term Goal 1 - Progress (Week 1): Met PT Short Term Goal 2 (Week 1): Pt will initiate gait training outside of // bars with LRAD PT Short Term Goal 2 - Progress (Week 1): Met PT Short Term Goal 3 (Week 1): Pt will initiate stair training as safe and able PT Short Term Goal 3 - Progress (Week 1): Met  Skilled Therapeutic Interventions/Progress Updates:    pt received in [facility]  and agreeable to therapy. Pt taken to gym total A in [place], for time and energy. Pt directed in squat pivot to mat table SBA. Pt directed in forward stepping and backward stepping 3  steps each, x5 with Rolling walker min A. Pt directed in 4" step ups alternating with Rolling walker min A with VC for trunk alignment and midline stance. Pt directed in gait training with Rolling walker 55' and 52' min A for stability with VC for B step length and placement and  one instance of LOB with mod A to right due to poor step placement on LLE and LOB to Rt, one seated rest break between 2/2 fatigue. Pt directed in WC mobility 200' SBA. Pt directed in x10 Sit to stand to Rolling walker CGA with VC for trunk extension and glute activation. Pt left in WC in room, All needs in reach and in good condition. Call light in hand.  And alarm belt set. Pt denied pain throughout.   Therapy Documentation Precautions:  Precautions Precautions: Fall Precaution Comments: ataxic LEs, fall risk Restrictions Weight Bearing Restrictions: No General:   Vital Signs: Therapy Vitals Temp: 98.1 F (36.7 C) Pulse Rate: 67 Resp: 15 BP: 135/79 Patient Position (if appropriate): Sitting Oxygen Therapy SpO2: 100  % O2 Device: Room Air Pain: Pain Assessment Pain Scale: 0-10 Pain Score: 0-No pain Mobility:   Locomotion :    Trunk/Postural Assessment :    Balance:   Exercises:   Other Treatments:      Therapy/Group: Individual Therapy  Junie Panning 12/29/2020, 4:20 PM

## 2020-12-29 NOTE — Progress Notes (Signed)
Physical Therapy Session Note  Patient Details  Name: Terry Mitchell MRN: [id number] Date of Birth: 09-Oct-1950  Today's Date: 12/29/2020 PT Individual Time: 1415-1500 PT Individual Time Calculation: 45 min  Short Term Goals: Week 1:  PT Short Term Goal 1 (Week 1): Pt will perform least restrictive transfer with min A consistently PT Short Term Goal 2 (Week 1): Pt will initiate gait training outside of // bars with LRAD PT Short Term Goal 3 (Week 1): Pt will initiate stair training as safe and able Week 2:     Skilled Therapeutic Interventions/Progress Updates:     Patient in w/c in the room upon PT arrival. Patient alert and agreeable to PT session. Patient denied pain during session.  Therapeutic Activity: Transfers: Patient performed squat pivot w/c>mat table with supervision and min cues for removal of leg rests for safety for w/c set-up. He performed sit to/from stand x1 with min/mod A using RW and x2 with supervision with RW following NMR activities, see below. Provided verbal cues for assessing foot placement, forward weight shift, and controlled descent.  Gait Training:  Patient ambulated 40 feet with 2x180 deg turns and 121 feet using RW with CGA-min A. Ambulated with decreased gait speed, step height, and step length, intermittent step-to pattern leading with L with fatigue, intermittent narrow BOS, and decreased heel strike at initial contact. Provided verbal cues for increased L weight shift with facilitation to promote R step length, increased BOS, and leading with his heel during swing for increased foot clearance, step length, and heel strike.  Wheelchair Mobility:  Patient propelled wheelchair 120 feet with mod I. Patient required set-up assist for w/c parts management throughout session.   Neuromuscular Re-ed: Patient performed the following lower extremity motor control activities for improved technique and increased independence with functional mobility: -sit to/from  stand x7 with blocked practice without AD and use of 1 upper extremity, focused on forward weight shift and eccentric control with descent, progressed from min A to CGA -standing balance without AD 2x2 min, first trial with B upper extremity support progressing to no upper extremity support, second trial without upper extremity support throughout trial with CGA-supervision, patient able to engage ankle strategies with mod cues to reduce posterior bias and sway -ambulating forward/backward 1x5 feet with hands on therapist's shoulders to promote increased dependence on lower extremities during gait and reduced upper extremity reliance on RW with langer ambulation trials, see above  Patient required increased time and rest breaks with activity due to decreased activity tolerance. Educated on technique for activities described above, and patient goals at d/c when returning to his family. Patient reports that he is excited to see his 37 month old gra great grand-child and hopes to hold her at d/c.  Patient in w/c in the room at end of session with breaks locked, chair alarm set, and all needs within reach.   Therapy Documentation Precautions:  Precautions Precautions: Fall Precaution Comments: ataxic LEs, fall risk Restrictions Weight Bearing Restrictions: No    Therapy/Group: Individual Therapy  Kamylle Axelson L Margherita Collyer PT, DPT  12/29/2020, 6:28 PM

## 2020-12-29 NOTE — Progress Notes (Signed)
Occupational Therapy Session Note  Patient Details  Name: Terry Mitchell MRN: [id number] Date of Birth: 01-10-1951  Today's Date: 12/29/2020 OT Individual Time: 0700-0810 OT Individual Time Calculation (min): 70 min    Short Term Goals: Week 2:  OT Short Term Goal 1 (Week 2): Pt will complete toileting tasks with min A OT Short Term Goal 2 (Week 2): Pt will perform toilet tranfsers with supervision OT Short Term Goal 3 (Week 2): Pt will pull pants over hips standing with CGA  Skilled Therapeutic Interventions/Progress Updates:    Initial focus on bathing at shower level and dressing with sit<>stand from w/c with RW. Squat pivot transfers with supervision. Toilet transfer with supervision using grab bars. Toileting with supervision. Pt doffed pants using lateral leans. Bathing at shower level with lateral leans-supervision. Dressing with CGA for standing when pulling pants over hips. Pt propelled to gym for NuStep - 7 mins BLE only. Pt engaged in squats from EOM-unilateral UE support, UE support on knee and mat, and UE support on knee only-3x8 each. Pt returned to room and remained in w/c with seat alarm actiavated and all needs within reach.   Therapy Documentation Precautions:  Precautions Precautions: Fall Precaution Comments: ataxic LEs, fall risk Restrictions Weight Bearing Restrictions: No  Pain:  Pt denies pain this morning   Therapy/Group: Individual Therapy  Leroy Libman 12/29/2020, 9:31 AM

## 2020-12-29 NOTE — Progress Notes (Signed)
Occupational Therapy Session Note  Patient Details  Name: Terry Mitchell MRN: [id number] Date of Birth: [birth year]  Today's Date: 12/29/2020 OT Individual Time: 1030-1100 OT Individual Time Calculation (min): 30 min    Short Term Goals: Week 2:  OT Short Term Goal 1 (Week 2): Pt will complete toileting tasks with min A OT Short Term Goal 2 (Week 2): Pt will perform toilet tranfsers with supervision OT Short Term Goal 3 (Week 2): Pt will pull pants over hips standing with CGA  Skilled Therapeutic Interventions/Progress Updates:    OT intervention with focus on standing balance with decreased UE support. Pt stood at table to construct and bread down pvc pipe structure. Pt required UE support during all segments including wiping pipes down. Pt propelled w/c to/from room to gym. Pt remained seated in w/c with seat alarm activated and all needs within reach.   Therapy Documentation Precautions:  Precautions Precautions: Fall Precaution Comments: ataxic LEs, fall risk Restrictions Weight Bearing Restrictions: No  Pain:  Pt denies pain this morning   Therapy/Group: Individual Therapy  Leroy Libman 12/29/2020, 12:57 PM

## 2020-12-29 NOTE — Plan of Care (Signed)
  Problem: RH Bed to Chair Transfers Goal: LTG Patient will perform bed/chair transfers w/assist (PT) Description: LTG: Patient will perform bed to chair transfers with assistance (PT). Flowsheets (Taken 12/29/2020 1220) LTG: Pt will perform Bed to Chair Transfers with assistance level: (upgrade due to progress) Independent with assistive device  Note: Upgrade due to progress

## 2020-12-29 NOTE — Progress Notes (Addendum)
PROGRESS NOTE   Subjective/Complaints:  Pt emphatic doesn't need Celexa for mood- doesn't want it and has refused it.   I am still concerned pt has a very flat affect, however pt says speaks with family about how he's doing.    ROS:  Pt denies SOB, abd pain, CP, N/V/C/D, and vision changes   Objective:   No results found. Recent Labs    12/29/20 0524  WBC 5.7  HGB 11.0*  HCT 33.9*  PLT PLATELET CLUMPS NOTED ON SMEAR, UNABLE TO ESTIMATE   Recent Labs    12/29/20 0524  NA 131*  K 4.7  CL 97*  CO2 26  GLUCOSE 115*  BUN 32*  CREATININE 1.40*  CALCIUM 8.6*    Intake/Output Summary (Last 24 hours) at 12/29/2020 0923 Last data filed at 12/29/2020 0811 Gross per 24 hour  Intake 720 ml  Output 1125 ml  Net -405 ml        Physical Exam: Vital Signs Blood pressure (!) 162/94, pulse (!) 58, temperature 98 F (36.7 C), temperature source Oral, resp. rate 17, weight 90.6 kg, SpO2 99 %.     General: awake, alert, appropriate, sitting up in bedside chair. NAD HENT: conjugate gaze; oropharynx moist CV: regular rate; no JVD Pulmonary: CTA B/L; no W/R/R- good air movement GI: soft, NT, ND, (+)BS Psychiatric: appropriate but extremely flat Musculoskeletal:        General: No swelling.     Cervical back: Normal range of motion and neck supple.     Comments: Pt's strength in UEs 5/5 B/L in biceps, triceps, WE, grip and finger abd B/L LEs: RLE- HF 4-/5, KE 4-/5, DF 4+/5, PF 4-/5 LLE: HF 3/5, KE 4-/5, DF 3+/5, and PF 4-/5 - no change Skin:    General: Skin is warm and dry. Skin looks great except has radiation mark as well as fungal toenails/very thickened   Neurological:     Mental Status: He is alert and oriented to person, place, and time.     Sensory: Sensory deficit present.     Motor: Weakness present.     Comments: Speech clear. Sensory deficits from waist down with paraplegia.  Sensation decreased a  lot from T7 on down to S5 B/L- no change on exam today- still decreased.     Assessment/Plan: 1. Functional deficits which require 3+ hours per day of interdisciplinary therapy in a comprehensive inpatient rehab setting. Physiatrist is providing close team supervision and 24 hour management of active medical problems listed below. Physiatrist and rehab team continue to assess barriers to discharge/monitor patient progress toward functional and medical goals  Care Tool:  Bathing    Body parts bathed by patient: Right arm, Left arm, Chest, Abdomen, Front perineal area, Buttocks, Right upper leg, Left upper leg, Right lower leg, Left lower leg, Face         Bathing assist Assist Level: Contact Guard/Touching assist     Upper Body Dressing/Undressing Upper body dressing   What is the patient wearing?: Pull over shirt    Upper body assist Assist Level: Independent    Lower Body Dressing/Undressing Lower body dressing      What is the  patient wearing?: Underwear/pull up, Pants     Lower body assist Assist for lower body dressing: Contact Guard/Touching assist     Toileting Toileting    Toileting assist Assist for toileting: Contact Guard/Touching assist     Transfers Chair/bed transfer  Transfers assist     Chair/bed transfer assist level: Minimal Assistance - Patient > 75%     Locomotion Ambulation   Ambulation assist      Assist level: Minimal Assistance - Patient > 75% Assistive device: Walker-rolling Max distance: 25'   Walk 10 feet activity   Assist     Assist level: Minimal Assistance - Patient > 75% Assistive device: Walker-rolling   Walk 50 feet activity   Assist Walk 50 feet with 2 turns activity did not occur: Safety/medical concerns         Walk 150 feet activity   Assist Walk 150 feet activity did not occur: Safety/medical concerns         Walk 10 feet on uneven surface  activity   Assist Walk 10 feet on uneven  surfaces activity did not occur: Safety/medical concerns         Wheelchair     Assist Will patient use wheelchair at discharge?: Yes Type of Wheelchair: Manual    Wheelchair assist level: Supervision/Verbal cueing Max wheelchair distance: 150'    Wheelchair 50 feet with 2 turns activity    Assist        Assist Level: Supervision/Verbal cueing   Wheelchair 150 feet activity     Assist      Assist Level: Supervision/Verbal cueing   Blood pressure (!) 162/94, pulse (!) 58, temperature 98 F (36.7 C), temperature source Oral, resp. rate 17, weight 90.6 kg, SpO2 99 %.  Medical Problem List and Plan: 1.  T6 paraplegia ASIA D secondary to mets to Spinal cord/tumors compressing [place]/prostate CA             -patient may  shower             -ELOS/Goals:10-14 days- goals supervision  Continue PT and OT- Radiation to actually finish Monday- they changed it.    con't PT and OT- asking if will walk "again"- explained we cannot tell, but he's walking some with RW- which is better than expected.   Con't PT and OT- done with radiation.  2.  Antithrombotics: -DVT/anticoagulation:  Pharmaceutical: Lovenox  -will need for at least 2 months due to [place], no matter how far he's walking             -antiplatelet therapy: N/a 3. Pain Management: MS contin bid with oxycodone prn. Continue and change as required  6/6- not taking prns- just scheduled pain meds- con't regimen  6/8- pain 4/10- controlled per pt- con't regimen- not taking prns.   6/9- pt doesn't want to change regimen- con't medspain 4/10.  4. Mood: LCSW to follow for evaluation and support.              -antipsychotic agents: N/a 5. Neuropsych: This patient is capable of making decisions on his own behalf. 6. Skin/Wound Care: Routine pressure relief measures.  7. Fluids/Electrolytes/Nutrition: Monitor I/O. Check lytes in am.  8. HTN: Monitor BP tid--continue Norvasc.              --Resume Cozaar if BP  continues to be labile    6/1- BP 139/79 this AM- overall borderline high- will wait to restart Cozaar- Of note, HR is 57 this AM  6/2- will restart Cozaar- 25 mg daily- will restart since BP is still elevated in 150s.   6/7- BP well controlled- con't regimen  6/8- BP 150s/90s this AM- usually controlled- will monitor for trend. 6/9- also elevated today- if isn't down by tomorrow, will increase Cozaar.   9. Metastatic prostate cancer to spine: XRT ongoing             --slow steroid taper--4 mg tid X 3 days followed by bid X 7 days-->daily X 7 days, then 2 mg daily X 7d days.   10. At risk for spasticity and neurogenic bowel and bladder- will scan bladder to make sure emptying and monitor bowels.    6/1- Pt reports small BM overnight- con't to monitor for urinary retention- as well as con't bowel meds for constipation.   6/3- LBM overnight- con't meds  6/4-6/5: continue laxatives  6/6- refusing laxatives, but still bleeding hemorrhoids con't to encourage usage.   6/7- LBM early this AM- con't regimen 11. L foot drop  6/1- will make sure has PRAFO ordered for L and wear at night.  12. Hyperkalemia  6/2- will give dose of Lokelma 10G x1- and will recheck in AM  6/3- K+ down to 5.0- will recheck Monday- might need Lokelma more frequently.   6/6- K+ 4.6 today-con't to monitor  6/9- K+ 4.7 today- will recheck Monday.  13. Hemorrhoids  6/3- will order Tucks and Anusol suppository since bleeding with BMs.     6/6- refusing miralax as well as anusol suppository even though he's still bleeding. 14. Depressed mood/flat  6/8- will start Celexa 10 mg daily and monitor for side effects.   6/9- pt refusing Celexa- will stop.  15. Azotemia  6/9- pt's Cr up to 1.40 from 1.18- will push fluids- and recheck Monday.    LOS: 9 days A FACE TO FACE EVALUATION WAS PERFORMED  Adolpho Meenach 12/29/2020, 9:23 AM

## 2020-12-30 NOTE — Progress Notes (Signed)
PROGRESS NOTE   Subjective/Complaints:  Pt reports pain has been 0/10 last 1-2 days- doesn't know why- but is great- if this continues, will try to wean his scheduled pain meds.   LBM this AM.  Feeling overall "better".   ROS:  Pt denies SOB, abd pain, CP, N/V/C/D, and vision changes   Objective:   No results found. Recent Labs    12/29/20 0524  WBC 5.7  HGB 11.0*  HCT 33.9*  PLT PLATELET CLUMPS NOTED ON SMEAR, UNABLE TO ESTIMATE   Recent Labs    12/29/20 0524  NA 131*  K 4.7  CL 97*  CO2 26  GLUCOSE 115*  BUN 32*  CREATININE 1.40*  CALCIUM 8.6*    Intake/Output Summary (Last 24 hours) at 12/30/2020 0842 Last data filed at 12/30/2020 0805 Gross per 24 hour  Intake 960 ml  Output 925 ml  Net 35 ml        Physical Exam: Vital Signs Blood pressure 105/69, pulse 61, temperature 99.7 F (37.6 C), temperature source Oral, resp. rate 18, weight 90.6 kg, SpO2 100 %.      General: awake, alert, appropriate, sitting up on mat in gym with OT; NAD HENT: conjugate gaze; oropharynx moist CV: regular rate; no JVD Pulmonary: CTA B/L; no W/R/R- good air movement GI: soft, NT, ND, (+)BS Psychiatric: appropriate but still very flat- smiled once today! Neurological: alert-  Musculoskeletal:        General: No swelling.     Cervical back: Normal range of motion and neck supple.     Comments: Pt's strength in UEs 5/5 B/L in biceps, triceps, WE, grip and finger abd B/L LEs: RLE- HF 4-/5, KE 4-/5, DF 4+/5, PF 4-/5 LLE: HF 3/5, KE 4-/5, DF 3+/5, and PF 4-/5 - no change Skin:    General: Skin is warm and dry. Skin looks great except has radiation mark as well as fungal toenails/very thickened   Neurological:     Mental Status: He is alert and oriented to person, place, and time.     Sensory: Sensory deficit present.     Motor: Weakness present.     Comments: Speech clear. Sensory deficits from waist down with  paraplegia.  Sensation decreased a lot from T7 on down to S5 B/L- no change on exam today- still decreased.     Assessment/Plan: 1. Functional deficits which require 3+ hours per day of interdisciplinary therapy in a comprehensive inpatient rehab setting. Physiatrist is providing close team supervision and 24 hour management of active medical problems listed below. Physiatrist and rehab team continue to assess barriers to discharge/monitor patient progress toward functional and medical goals  Care Tool:  Bathing    Body parts bathed by patient: Right arm, Left arm, Chest, Abdomen, Front perineal area, Buttocks, Right upper leg, Left upper leg, Right lower leg, Left lower leg, Face         Bathing assist Assist Level: Contact Guard/Touching assist     Upper Body Dressing/Undressing Upper body dressing   What is the patient wearing?: Pull over shirt    Upper body assist Assist Level: Independent    Lower Body Dressing/Undressing Lower body dressing  What is the patient wearing?: Underwear/pull up, Pants     Lower body assist Assist for lower body dressing: Contact Guard/Touching assist     Toileting Toileting    Toileting assist Assist for toileting: Contact Guard/Touching assist     Transfers Chair/bed transfer  Transfers assist     Chair/bed transfer assist level: Minimal Assistance - Patient > 75%     Locomotion Ambulation   Ambulation assist      Assist level: Minimal Assistance - Patient > 75% Assistive device: Walker-rolling Max distance: 25'   Walk 10 feet activity   Assist     Assist level: Minimal Assistance - Patient > 75% Assistive device: Walker-rolling   Walk 50 feet activity   Assist Walk 50 feet with 2 turns activity did not occur: Safety/medical concerns         Walk 150 feet activity   Assist Walk 150 feet activity did not occur: Safety/medical concerns         Walk 10 feet on uneven surface   activity   Assist Walk 10 feet on uneven surfaces activity did not occur: Safety/medical concerns         Wheelchair     Assist Will patient use wheelchair at discharge?: Yes Type of Wheelchair: Manual    Wheelchair assist level: Supervision/Verbal cueing Max wheelchair distance: 150'    Wheelchair 50 feet with 2 turns activity    Assist        Assist Level: Supervision/Verbal cueing   Wheelchair 150 feet activity     Assist      Assist Level: Supervision/Verbal cueing   Blood pressure 105/69, pulse 61, temperature 99.7 F (37.6 C), temperature source Oral, resp. rate 18, weight 90.6 kg, SpO2 100 %.  Medical Problem List and Plan: 1.  T6 paraplegia ASIA D secondary to mets to Spinal cord/tumors compressing [place]/prostate CA             -patient may  shower             -ELOS/Goals:10-14 days- goals supervision  Continue PT and OT- Radiation to actually finish Monday- they changed it.    con't PT and OT- asking if will walk "again"- explained we cannot tell, but he's walking some with RW- which is better than expected.   Con't PT and OT- doing sit to stands today- could only do 2 and then couldn't do again on mat-  2.  Antithrombotics: -DVT/anticoagulation:  Pharmaceutical: Lovenox  -will need for at least 2 months due to [place], no matter how far he's walking             -antiplatelet therapy: N/a 3. Pain Management: MS contin bid with oxycodone prn. Continue and change as required  6/6- not taking prns- just scheduled pain meds- con't regimen  6/8- pain 4/10- controlled per pt- con't regimen- not taking prns.   6/9- pt doesn't want to change regimen- con't medspain   6/10- pt reports for last 24 hours, pain 0/10- no pain- if continues, will wean long acting pain meds.  4. Mood: LCSW to follow for evaluation and support.              -antipsychotic agents: N/a 5. Neuropsych: This patient is capable of making decisions on his own behalf. 6.  Skin/Wound Care: Routine pressure relief measures.  7. Fluids/Electrolytes/Nutrition: Monitor I/O. Check lytes in am.  8. HTN: Monitor BP tid--continue Norvasc.              --  Resume Cozaar if BP continues to be labile    6/1- BP 139/79 this AM- overall borderline high- will wait to restart Cozaar- Of note, HR is 57 this AM  6/2- will restart Cozaar- 25 mg daily- will restart since BP is still elevated in 150s.   6/7- BP well controlled- con't regimen  6/8- BP 150s/90s this AM- usually controlled- will monitor for trend. 6/9- also elevated today- if isn't down by tomorrow, will increase Cozaar. 6/10- BP 105/69 this AM- will con't current regimen   9. Metastatic prostate cancer to spine: XRT ongoing             --slow steroid taper--4 mg tid X 3 days followed by bid X 7 days-->daily X 7 days, then 2 mg daily X 7d days.   10. At risk for spasticity and neurogenic bowel and bladder- will scan bladder to make sure emptying and monitor bowels.    6/1- Pt reports small BM overnight- con't to monitor for urinary retention- as well as con't bowel meds for constipation.   6/3- LBM overnight- con't meds  6/4-6/5: continue laxatives  6/6- refusing laxatives, but still bleeding hemorrhoids con't to encourage usage.   6/7- LBM early this AM- con't regimen 11. L foot drop  6/1- will make sure has PRAFO ordered for L and wear at night.  12. Hyperkalemia  6/2- will give dose of Lokelma 10G x1- and will recheck in AM  6/3- K+ down to 5.0- will recheck Monday- might need Lokelma more frequently.   6/6- K+ 4.6 today-con't to monitor  6/9- K+ 4.7 today- will recheck Monday.  13. Hemorrhoids  6/3- will order Tucks and Anusol suppository since bleeding with BMs.     6/6- refusing miralax as well as anusol suppository even though he's still bleeding. 14. Depressed mood/flat  6/8- will start Celexa 10 mg daily and monitor for side effects.   6/9- pt refusing Celexa- will stop.  15. Azotemia  6/9- pt's Cr up  to 1.40 from 1.18- will push fluids- and recheck Monday.   6/10- encouraged pt to drink 6-8 cups /day -admits was drinking 2-3 cups/day- will recheck Monday.    LOS: 10 days A FACE TO FACE EVALUATION WAS PERFORMED  Terry Mitchell 12/30/2020, 8:42 AM

## 2020-12-30 NOTE — Progress Notes (Signed)
                                                                                                                                                             Patient Name: Terry Mitchell MRN: [id number] DOB: Jan 25, 1951 Referring Physician: Beverlyn Roux (Profile Not Attached) Date of Service: 12/26/2020 [place], [place]                                                        End Of Treatment Note  Diagnoses: C61-Malignant neoplasm of prostate C61-Malignant neoplasm of prostate C79.51-Secondary malignant neoplasm of bone C79.51-Secondary malignant neoplasm of bone  Cancer Staging: Metastatic prostate cancer to bone  Intent: Palliative  Radiation Treatment Dates: 12/12/2020 through 12/26/2020 Site Technique Total Dose (Gy) Dose per Fx (Gy) Completed Fx Beam Energies  Thoracic Spine: Spine_T6 Complex 27/27 3 9/9 10X, 15X   Narrative: The patient tolerated radiation therapy relatively well.   Plan: The patient will receive a call in about one month from the radiation oncology department. He will continue follow up with medical oncology when he discharges from the hospital  ________________________________________________    Carola Rhine, PAC

## 2020-12-30 NOTE — Progress Notes (Signed)
Occupational Therapy Session Note  Patient Details  Name: Terry Mitchell MRN: [id number] Date of Birth: 1950/10/19  Today's Date: 12/30/2020 OT Individual Time: 1300-1355 OT Individual Time Calculation (min): 55 min    Short Term Goals: Week 2:  OT Short Term Goal 1 (Week 2): Pt will complete toileting tasks with min A OT Short Term Goal 2 (Week 2): Pt will perform toilet tranfsers with supervision OT Short Term Goal 3 (Week 2): Pt will pull pants over hips standing with CGA  Skilled Therapeutic Interventions/Progress Updates:    Pt resting in w/c upon arrival. OT intervention with focus on w/c mobility, sit<>stand, standing balance, and functional amb with RW.  W/c mobility: pt propelled w/c to central elevators and throughout [facility] before propelling outside in courtyard. Pt propelled w/c back to room.  Functional amb: Pt amb with RW 20'x2 with RW with min A and min verbal cues for foot placement.  Sit<>stand/standing balance: Sit<>stand from w/c at hi-lo table with CGA. Pt progressed to standing at table without UE support to remove and replace checkers tokens on magnetic board. Pt performed standing task X 4. CGA for standing balance.  Pt propelled w/c back to room and remained in w/c with all needs within reach and seat alarm activated.   Therapy Documentation Precautions:  Precautions Precautions: Fall Precaution Comments: ataxic LEs, fall risk Restrictions Weight Bearing Restrictions: No   Pain:  Pt denies pain this afternoon  Therapy/Group: Individual Therapy  Leroy Libman 12/30/2020, 2:00 PM

## 2020-12-30 NOTE — Progress Notes (Addendum)
Physical Therapy Session Note  Patient Details  Name: Terry Mitchell MRN: [id number] Date of Birth: December 23, 1950  Today's Date: 12/30/2020 PT Individual Time: 5056-9794 PT Individual Time Calculation (min): 59 min   Short Term Goals: Week 1:  PT Short Term Goal 1 (Week 1): Pt will perform least restrictive transfer with min A consistently PT Short Term Goal 1 - Progress (Week 1): Met PT Short Term Goal 2 (Week 1): Pt will initiate gait training outside of // bars with LRAD PT Short Term Goal 2 - Progress (Week 1): Met PT Short Term Goal 3 (Week 1): Pt will initiate stair training as safe and able PT Short Term Goal 3 - Progress (Week 1): Met  Skilled Therapeutic Interventions/Progress Updates: Pt presents sitting in w/c and agreeable to therapy.  Pt transfers sit to stand w/ min A and verbal cues for initiation.  Pt amb to Dayroom w/ RW and min A up to 60'.  Pt requires verbal cues for posture and positioning w/in RW and improved foot clearance and BOS.  Pt amb w/ appropriate BOS 40% and step-through gait pattern 40% of time.  Pt performed multiple sit to stand transfers from various surfaces including mat table, w/c, and arm chair.  Pt given verbal cues for forward lean and use of LEs after LE positioning.  Pt performed step-pivot w/c <> mat table w/ RW at min A and w/ HHA and mod A.  Pt amb up to 60' w/ RW and min A, including safe approach to seat w/ verbal cues.  Pt negotiated through obstacle course of 4 cones.  Pt performed 3 x 10-15 toe taps to 3 3/4" platform w/ verbal cues for increased hip flexion.  Pt returned to room and remained in w/c w/ seat alarm on and all needs in reach.     Therapy Documentation Precautions:  Precautions Precautions: Fall Precaution Comments: ataxic LEs, fall risk Restrictions Weight Bearing Restrictions: No General:   Vital Signs: Therapy Vitals Pulse Rate: 61 BP: 105/69 Patient Position (if appropriate): Sitting Oxygen Therapy SpO2: 100 % O2  Device: Room Air Pain:no c/o pain       Therapy/Group: Individual Therapy  Ladoris Gene 12/30/2020, 12:19 PM

## 2020-12-30 NOTE — Progress Notes (Signed)
Physical Therapy Session Note  Patient Details  Name: Terry Mitchell MRN: [id number] Date of Birth: [birth year]  Today's Date: 12/30/2020 PT Individual Time: 0905-0930 PT Individual Time Calculation (min): 25 min   Short Term Goals:  Week 2:  PT Short Term Goal 1 (Week 2): Pt will ambulate x 100 ft with LRAD and min A PT Short Term Goal 2 (Week 2): Pt will perform least restrictive transfer with Supervision consistently PT Short Term Goal 3 (Week 2): Pt will navigate 6" stairs with one handrail and min A  Skilled Therapeutic Interventions/Progress Updates:   Pt received sitting in WC and agreeable to PT. WC mobility through hall x 138ft with supervision assist from PT for safety with cues only for doorway management.   Pt performed 5xSTS: 46.5 sec (>15 sec indicates increased fall risk).  BUE support on RW and pushing from WC arm rest throughout.   Gait training with RW x 40ft and 25ft with min assist for improved lateral weight shift to the L and cues for improved hip flexion and heel contact on the RLE; noted to have improved gait pattern with increased repetition and distance.    PT instructed pt in TUG: 101 sec (>13.5 sec indicates increased fall risk) min assist  from PT for safety and weight shift to the L to advance the RLE and BUE support on RW.          Therapy Documentation Precautions:  Precautions Precautions: Fall Precaution Comments: ataxic LEs, fall risk Restrictions Weight Bearing Restrictions: No  Vital Signs: Therapy Vitals Pulse Rate: 61 BP: 105/69 Patient Position (if appropriate): Sitting Oxygen Therapy SpO2: 100 % O2 Device: Room Air Pain: denies    Therapy/Group: Individual Therapy  Lorie Phenix 12/30/2020, 9:34 AM

## 2020-12-30 NOTE — Progress Notes (Signed)
Occupational Therapy Session Note  Patient Details  Name: Terry Mitchell MRN: [id number] Date of Birth: 08/14/1950  Today's Date: 12/30/2020 OT Individual Time: 0700-0809 OT Individual Time Calculation (min): 69 min    Short Term Goals: Week 2:  OT Short Term Goal 1 (Week 2): Pt will complete toileting tasks with min A OT Short Term Goal 2 (Week 2): Pt will perform toilet tranfsers with supervision OT Short Term Goal 3 (Week 2): Pt will pull pants over hips standing with CGA  Skilled Therapeutic Interventions/Progress Updates:    Pt resting in bed upon arrival. Pt requested to use toilet and brush teeth at sink. All squat pivot tranfsers with supervision. Toileting with supervision using lateral leans for clothing mgmt.   NuStep 7 mins BLE only level 5  Sit<>stand from EOM and toe taps on colored mats and toe taps on small cones. Pt had to readjust BLE position after each toe tap 4x5 each.  BIT activity on Bell cancellation using UE alternating. Pt not hyperextending knees as much.  Squats from EOM; BUE on knees and UE on knee and UE on mat. 6x5 each. P fatigues quickly and typically last squat requires CGA to complete and control descent.  Pt propelled w/c back to room and remained in w/c with all needs within reach and seat alarm activated.  Therapy Documentation Precautions:  Precautions Precautions: Fall Precaution Comments: ataxic LEs, fall risk Restrictions Weight Bearing Restrictions: No Pain:  Pt denies pain this morning   Therapy/Group: Individual Therapy  Leroy Libman 12/30/2020, 8:11 AM

## 2020-12-31 MED ORDER — MORPHINE SULFATE ER 15 MG PO TBCR
15.0000 mg | EXTENDED_RELEASE_TABLET | Freq: Every day | ORAL | Status: DC
  Administered 2021-01-01 – 2021-01-02 (×2): 15 mg via ORAL
  Filled 2020-12-31 (×2): qty 1

## 2020-12-31 NOTE — Progress Notes (Signed)
PROGRESS NOTE   Subjective/Complaints:  Pt reports still has no pain- willing to reduce AM scheduled MS Contin- will stop and see how does- if continues, will wean off PM MS Contin.   Also, pt has new bruising on R foot- dorsum of toes- weird place to bruise, unless stepped on- doesn't remember any trauma to R foot.    ROS:  Pt denies SOB, abd pain, CP, N/V/C/D, and vision changes  Objective:   No results found. Recent Labs    12/29/20 0524  WBC 5.7  HGB 11.0*  HCT 33.9*  PLT PLATELET CLUMPS NOTED ON SMEAR, UNABLE TO ESTIMATE   Recent Labs    12/29/20 0524  NA 131*  K 4.7  CL 97*  CO2 26  GLUCOSE 115*  BUN 32*  CREATININE 1.40*  CALCIUM 8.6*    Intake/Output Summary (Last 24 hours) at 12/31/2020 1219 Last data filed at 12/31/2020 0900 Gross per 24 hour  Intake 720 ml  Output 250 ml  Net 470 ml        Physical Exam: Vital Signs Blood pressure 133/80, pulse 66, temperature 97.8 F (36.6 C), temperature source Oral, resp. rate 18, weight 89.7 kg, SpO2 98 %.       General: awake, alert, appropriate, sitting up in bed; NAD HENT: conjugate gaze; oropharynx moist CV: regular rate; no JVD Pulmonary: CTA B/L; no W/R/R- good air movement GI: soft, NT, ND, (+)BS; normoactive Psychiatric: still very flat, but more interactive Neurological: alert Musculoskeletal:        General: No swelling.     Cervical back: Normal range of motion and neck supple.     Comments: Pt's strength in UEs 5/5 B/L in biceps, triceps, WE, grip and finger abd B/L LEs: RLE- HF 4-/5, KE 4-/5, DF 4+/5, PF 4-/5 LLE: HF 3/5, KE 4-/5, DF 3+/5, and PF 4-/5 - no change Skin:has some new bruising the top/base of 2nd-5th toes on R foot- brownish purple- no trauma - intact pulse    General: Skin is warm and dry. Skin looks great except has radiation mark as well as fungal toenails/very thickened   Neurological:     Mental Status: He  is alert and oriented to person, place, and time.     Sensory: Sensory deficit present.     Motor: Weakness present.     Comments: Speech clear. Sensory deficits from waist down with paraplegia.  Sensation decreased a lot from T7 on down to S5 B/L- no change on exam today- still decreased.     Assessment/Plan: 1. Functional deficits which require 3+ hours per day of interdisciplinary therapy in a comprehensive inpatient rehab setting. Physiatrist is providing close team supervision and 24 hour management of active medical problems listed below. Physiatrist and rehab team continue to assess barriers to discharge/monitor patient progress toward functional and medical goals  Care Tool:  Bathing    Body parts bathed by patient: Right arm, Left arm, Chest, Abdomen, Front perineal area, Buttocks, Right upper leg, Left upper leg, Right lower leg, Left lower leg, Face         Bathing assist Assist Level: Contact Guard/Touching assist     Upper Body Dressing/Undressing  Upper body dressing   What is the patient wearing?: Pull over shirt    Upper body assist Assist Level: Independent    Lower Body Dressing/Undressing Lower body dressing      What is the patient wearing?: Underwear/pull up, Pants     Lower body assist Assist for lower body dressing: Contact Guard/Touching assist     Toileting Toileting    Toileting assist Assist for toileting: Contact Guard/Touching assist     Transfers Chair/bed transfer  Transfers assist     Chair/bed transfer assist level: Minimal Assistance - Patient > 75%     Locomotion Ambulation   Ambulation assist      Assist level: Minimal Assistance - Patient > 75% Assistive device: Walker-rolling Max distance: 60   Walk 10 feet activity   Assist     Assist level: Minimal Assistance - Patient > 75% Assistive device: Walker-rolling   Walk 50 feet activity   Assist Walk 50 feet with 2 turns activity did not occur:  Safety/medical concerns  Assist level: Minimal Assistance - Patient > 75% Assistive device: Walker-rolling    Walk 150 feet activity   Assist Walk 150 feet activity did not occur: Safety/medical concerns         Walk 10 feet on uneven surface  activity   Assist Walk 10 feet on uneven surfaces activity did not occur: Safety/medical concerns         Wheelchair     Assist Will patient use wheelchair at discharge?: Yes Type of Wheelchair: Manual    Wheelchair assist level: Supervision/Verbal cueing Max wheelchair distance: 150'    Wheelchair 50 feet with 2 turns activity    Assist        Assist Level: Supervision/Verbal cueing   Wheelchair 150 feet activity     Assist      Assist Level: Supervision/Verbal cueing   Blood pressure 133/80, pulse 66, temperature 97.8 F (36.6 C), temperature source Oral, resp. rate 18, weight 89.7 kg, SpO2 98 %.  Medical Problem List and Plan: 1.  T6 paraplegia ASIA D secondary to mets to Spinal cord/tumors compressing [place]/prostate CA             -patient may  shower             -ELOS/Goals:10-14 days- goals supervision  Continue PT and OT- Radiation to actually finish Monday- they changed it.    con't PT and OT- asking if will walk "again"- explained we cannot tell, but he's walking some with RW- which is better than expected.   Con't PT and OT-  2.  Antithrombotics: -DVT/anticoagulation:  Pharmaceutical: Lovenox  -will need for at least 2 months due to [place], no matter how far he's walking             -antiplatelet therapy: N/a 3. Pain Management: MS contin bid with oxycodone prn. Continue and change as required  6/6- not taking prns- just scheduled pain meds- con't regimen  6/8- pain 4/10- controlled per pt- con't regimen- not taking prns.   6/9- pt doesn't want to change regimen- con't medspain   6/10- pt reports for last 24 hours, pain 0/10- no pain- if continues, will wean long acting pain meds. 6/11-  will stop AM MS Contin as of tomorrow AM- and monitor- has prns if needed  4. Mood: LCSW to follow for evaluation and support.              -antipsychotic agents: N/a 5. Neuropsych: This patient  is capable of making decisions on his own behalf. 6. Skin/Wound Care: Routine pressure relief measures.  7. Fluids/Electrolytes/Nutrition: Monitor I/O. Check lytes in am.  8. HTN: Monitor BP tid--continue Norvasc.              --Resume Cozaar if BP continues to be labile    6/1- BP 139/79 this AM- overall borderline high- will wait to restart Cozaar- Of note, HR is 57 this AM  6/2- will restart Cozaar- 25 mg daily- will restart since BP is still elevated in 150s.   6/7- BP well controlled- con't regimen  6/8- BP 150s/90s this AM- usually controlled- will monitor for trend. 6/9- also elevated today- if isn't down by tomorrow, will increase Cozaar. 6/11- BP controlled 130s/80s- con't regimen 9. Metastatic prostate cancer to spine: XRT ongoing             --slow steroid taper--4 mg tid X 3 days followed by bid X 7 days-->daily X 7 days, then 2 mg daily X 7d days.   10. At risk for spasticity and neurogenic bowel and bladder- will scan bladder to make sure emptying and monitor bowels.    6/1- Pt reports small BM overnight- con't to monitor for urinary retention- as well as con't bowel meds for constipation.   6/3- LBM overnight- con't meds  6/4-6/5: continue laxatives  6/6- refusing laxatives, but still bleeding hemorrhoids con't to encourage usage.   6/7- LBM early this AM- con't regimen  6/11- refusing laxatives- con't regimen 11. L foot drop  6/1- will make sure has PRAFO ordered for L and wear at night.  12. Hyperkalemia  6/2- will give dose of Lokelma 10G x1- and will recheck in AM  6/3- K+ down to 5.0- will recheck Monday- might need Lokelma more frequently.   6/6- K+ 4.6 today-con't to monitor  6/9- K+ 4.7 today- will recheck Monday.   6/11- will recheck labs in AM 13. Hemorrhoids  6/3-  will order Tucks and Anusol suppository since bleeding with BMs.     6/6- refusing miralax as well as anusol suppository even though he's still bleeding. 14. Depressed mood/flat  6/8- will start Celexa 10 mg daily and monitor for side effects.   6/9- pt refusing Celexa- will stop.  15. Azotemia  6/9- pt's Cr up to 1.40 from 1.18- will push fluids- and recheck Monday.   6/10- encouraged pt to drink 6-8 cups /day -admits was drinking 2-3 cups/day- will recheck Monday.  16. Bruising of R foot  6/11- will check platelet level- was last clumping- be that was low- could be cause- will check in AM   LOS: 11 days A FACE TO FACE EVALUATION WAS PERFORMED  Terry Mitchell 12/31/2020, 12:19 PM

## 2020-12-31 NOTE — Progress Notes (Signed)
+/-   sleep. Up to BR, continent B & B. Uses urinal while in bed. Declined scheduled miralax, reports having 2 BM's earlier. Denies pain. Bruising noted to right 4th toe and at base of 3rd-5th toe, and base of toes posteriorly. Small bruise to lateral aspect of right foot. Patient denies pain to those areas and doesn't remember hitting right foot on anything. Left PRAFO applied at HS. Patrici Ranks A

## 2021-01-01 LAB — BASIC METABOLIC PANEL
Anion gap: 8 (ref 5–15)
BUN: 27 mg/dL — ABNORMAL HIGH (ref 8–23)
CO2: 26 mmol/L (ref 22–32)
Calcium: 8.6 mg/dL — ABNORMAL LOW (ref 8.9–10.3)
Chloride: 98 mmol/L (ref 98–111)
Creatinine, Ser: 1.24 mg/dL (ref 0.61–1.24)
GFR, Estimated: >60 mL/min (ref >60)
Glucose, Bld: 115 mg/dL — ABNORMAL HIGH (ref 70–99)
Potassium: 4.9 mmol/L (ref 3.5–5.1)
Sodium: 132 mmol/L — ABNORMAL LOW (ref 135–145)

## 2021-01-01 LAB — CBC WITH DIFFERENTIAL/PLATELET
Abs Immature Granulocytes: 0.04 10*3/uL (ref 0.00–0.07)
Basophils Absolute: 0 10*3/uL (ref 0.0–0.1)
Basophils Relative: 0 %
Eosinophils Absolute: 0 10*3/uL (ref 0.0–0.5)
Eosinophils Relative: 0 %
HCT: 35.5 % — ABNORMAL LOW (ref 39.0–52.0)
Hemoglobin: 11.8 g/dL — ABNORMAL LOW (ref 13.0–17.0)
Immature Granulocytes: 1 %
Lymphocytes Relative: 2 %
Lymphs Abs: 0.1 10*3/uL — ABNORMAL LOW (ref 0.7–4.0)
MCH: 33.7 pg (ref 26.0–34.0)
MCHC: 33.2 g/dL (ref 30.0–36.0)
MCV: 101.4 fL — ABNORMAL HIGH (ref 80.0–100.0)
Monocytes Absolute: 0.4 10*3/uL (ref 0.1–1.0)
Monocytes Relative: 7 %
Neutro Abs: 5.5 10*3/uL (ref 1.7–7.7)
Neutrophils Relative %: 90 %
Platelets: 114 10*3/uL — ABNORMAL LOW (ref 150–400)
RBC: 3.5 MIL/uL — ABNORMAL LOW (ref 4.22–5.81)
RDW: 13.2 % (ref 11.5–15.5)
WBC: 6.1 10*3/uL (ref 4.0–10.5)
nRBC: 0.5 % — ABNORMAL HIGH (ref 0.0–0.2)

## 2021-01-01 NOTE — Progress Notes (Signed)
Physical Therapy Session Note  Patient Details  Name: Terry Mitchell MRN: [id number] Date of Birth: [birth year]  Today's Date: 01/01/2021 PT Individual Time: 0900-1000; 1445-1525 PT Individual Time Calculation (min): 60 min and 40 min  Short Term Goals:  Week 2:  PT Short Term Goal 1 (Week 2): Pt will ambulate x 100 ft with LRAD and min A PT Short Term Goal 2 (Week 2): Pt will perform least restrictive transfer with Supervision consistently PT Short Term Goal 3 (Week 2): Pt will navigate 6" stairs with one handrail and min A   Skilled Therapeutic Interventions/Progress Updates:    Session 1: Pt received seated in w/c in room, agreeable to PT session. No complaints of pain. Pt requesting to shower this AM due to having no OT scheduled today. Pt is able to gather clothing and toiletry items independently at w/c level prior to entering shower.Pt is CGA for stand pivot transfer w/c to shower seat with use of grab bar. Pt is setup A for bathing while seated in shower chair. Pt is able to get dressed while seated in w/c independently, does require CGA for sit to stand with grab bars and close Supervision for standing balance while pulling pants up over hips. Pt able to don shirt independently, shoes and socks with setup A. Pt is independent for w/c mobility up to 150 ft, independent for management of w/c parts. Sit to stand with min A to RW. Ambulation x 100 ft with RW and min A, flexed trunk, narrow BOS, decreased RLE step length, decreased B heel strike L>R, and L toe drag with onset of fatigue. Trial gait with use of L foot up brace. Ambulation x 100 ft with RW and min A with foot up brace. Pt exhibits slightly improved ability to clear L foot during gait with brace, will benefit from further assessment. Pt left seated in w/c in room with needs in reach at end of session.  Session 2: Pt received seated in w/c in room, agreeable to PT session. Pt initially reports he has not been experiencing any  pain but at end of session with son present pt mentions he has a "band like" tightness in thoracic region and N/T down into his legs. Pt has been experiencing this N/T since initial onset of symptoms prior to admission to hospital. Discussed at-level SCI pain with patient and to bring up to MD during rounds tomorrow. Session focus on trial of AFO (PLS and GRAFO) to determine best fit. Ambulation x 75 ft with PLS and min A for balance. Pt exhibits improved RLE step length and improved LLE foot clearance with use of PLS. Ambulation x 25 ft with GRAFO and min A, improved overall gait mechanics but pt reports brace cutting into this ankle due to being too small, deferred further use of this brace. Patient's ankle inspected, no skin breakdown noted. Pt does exhibit BLE edema. Ambulation x 75 ft with no AD and min A, decreased ability to improve RLE step length and decreased LLE clearance. Pt will benefit from ongoing trial of AFO to determine best fit. Pt left seated up in recliner in room with BLE elevated for edema management, needs in reach, chair alarm in place, son present.   Therapy Documentation Precautions:  Precautions Precautions: Fall Precaution Comments: ataxic LEs, fall risk Restrictions Weight Bearing Restrictions: No     Therapy/Group: Individual Therapy   Excell Seltzer, PT, DPT, CSRS  01/01/2021, 12:16 PM

## 2021-01-01 NOTE — Progress Notes (Signed)
Occupational Therapy Session Note  Patient Details  Name: Terry Mitchell MRN: [id number] Date of Birth: [birth year]  Today's Date: 01/01/2021 OT Group Time: 1100-1200 OT Group Time Calculation (min): 60 min  Skilled Therapeutic Interventions/Progress Updates:    Pt engaged in therapeutic w/c level dance group focusing on patient choice, UE/LE strengthening, salience, activity tolerance, and social participation. Pt was guided through various dance-based exercises involving UEs/LEs and trunk. All music was selected by group members. Emphasis placed on general strengthening/endurance + LE strengthening. Pt participated very well while seated, declined standing though was provided with opportunities to do so. He emphasized LE strengthening/coordination during exercises. At end of session pt self propelled his w/c back to the room.    Therapy Documentation Precautions:  Precautions Precautions: Fall Precaution Comments: ataxic LEs, fall risk Restrictions Weight Bearing Restrictions: No  Pain: no s/s pain during tx   ADL: ADL Eating: Not assessed Grooming: Setup Where Assessed-Grooming: Sitting at sink Upper Body Bathing: Setup Lower Body Bathing: Minimal assistance (lateral leans, seated in wheelchair) Where Assessed-Lower Body Bathing: Sitting at sink Upper Body Dressing: Setup Lower Body Dressing: Moderate assistance Toileting: Not assessed Toilet Transfer: Not assessed     Therapy/Group: Group Therapy  Marie Chow A [place] 01/01/2021, 12:47 PM

## 2021-01-02 LAB — CBC WITH DIFFERENTIAL/PLATELET
Abs Immature Granulocytes: 0.05 10*3/uL (ref 0.00–0.07)
Basophils Absolute: 0 10*3/uL (ref 0.0–0.1)
Basophils Relative: 0 %
Eosinophils Absolute: 0 10*3/uL (ref 0.0–0.5)
Eosinophils Relative: 0 %
HCT: 35.3 % — ABNORMAL LOW (ref 39.0–52.0)
Hemoglobin: 11.8 g/dL — ABNORMAL LOW (ref 13.0–17.0)
Immature Granulocytes: 1 %
Lymphocytes Relative: 1 %
Lymphs Abs: 0.1 10*3/uL — ABNORMAL LOW (ref 0.7–4.0)
MCH: 33.7 pg (ref 26.0–34.0)
MCHC: 33.4 g/dL (ref 30.0–36.0)
MCV: 100.9 fL — ABNORMAL HIGH (ref 80.0–100.0)
Monocytes Absolute: 0.6 10*3/uL (ref 0.1–1.0)
Monocytes Relative: 7 %
Neutro Abs: 7.6 10*3/uL (ref 1.7–7.7)
Neutrophils Relative %: 91 %
Platelets: 90 10*3/uL — ABNORMAL LOW (ref 150–400)
RBC: 3.5 MIL/uL — ABNORMAL LOW (ref 4.22–5.81)
RDW: 13.4 % (ref 11.5–15.5)
WBC: 8.4 10*3/uL (ref 4.0–10.5)
nRBC: 0 % (ref 0.0–0.2)

## 2021-01-02 LAB — BASIC METABOLIC PANEL
Anion gap: 9 (ref 5–15)
BUN: 32 mg/dL — ABNORMAL HIGH (ref 8–23)
CO2: 27 mmol/L (ref 22–32)
Calcium: 8.6 mg/dL — ABNORMAL LOW (ref 8.9–10.3)
Chloride: 97 mmol/L — ABNORMAL LOW (ref 98–111)
Creatinine, Ser: 1.4 mg/dL — ABNORMAL HIGH (ref 0.61–1.24)
GFR, Estimated: 55 mL/min — ABNORMAL LOW (ref >60)
Glucose, Bld: 118 mg/dL — ABNORMAL HIGH (ref 70–99)
Potassium: 4.8 mmol/L (ref 3.5–5.1)
Sodium: 133 mmol/L — ABNORMAL LOW (ref 135–145)

## 2021-01-02 MED ORDER — BENZONATATE 100 MG PO CAPS: 100.0000 mg | ORAL_CAPSULE | Freq: Three times a day (TID) | ORAL | Status: DC | PRN

## 2021-01-02 NOTE — Progress Notes (Signed)
Occupational Therapy Session Note  Patient Details  Name: Sameer Teeple MRN: [id number] Date of Birth: 1951-06-23  Today's Date: 01/02/2021 OT Individual Time: 1300-1330 OT Individual Time Calculation (min): 30 min    Short Term Goals: Week 2:  OT Short Term Goal 1 (Week 2): Pt will complete toileting tasks with min A OT Short Term Goal 2 (Week 2): Pt will perform toilet tranfsers with supervision OT Short Term Goal 3 (Week 2): Pt will pull pants over hips standing with CGA  Skilled Therapeutic Interventions/Progress Updates:    OT intervention with focus on standing balance to complete table tasks without BUE support. Pt assemble pvc pipe structure using BUE. After rest break, pt disassembled pipe tree and then donned gloves and cleaned all equipment. Pt used BUE simultaneously during task and did not use UE for support. Pt completed all tasks with CGA. Pt returned to room and remained seated in w/c with seat alarm activated and all needs within reach.   Therapy Documentation Precautions:  Precautions Precautions: Fall Precaution Comments: ataxic LEs, fall risk Restrictions Weight Bearing Restrictions: No   Pain:  Pt denies pain this afternoon   Therapy/Group: Individual Therapy  Leroy Libman 01/02/2021, 2:32 PM

## 2021-01-02 NOTE — Progress Notes (Signed)
Physical Therapy Session Note  Patient Details  Name: Terry Mitchell MRN: [id number] Date of Birth: 1951-05-29  Today's Date: 01/02/2021 PT Individual Time: 0830-0930; 1030-1100 PT Individual Time Calculation (min): 60 min and 30 min  Short Term Goals:  Week 2:  PT Short Term Goal 1 (Week 2): Pt will ambulate x 100 ft with LRAD and min A PT Short Term Goal 2 (Week 2): Pt will perform least restrictive transfer with Supervision consistently PT Short Term Goal 3 (Week 2): Pt will navigate 6" stairs with one handrail and min A   Skilled Therapeutic Interventions/Progress Updates:    Session 1: Pt received seated in w/c in room, agreeable to PT session. No complaints of pain. Manual w/c propulsion x 150 ft at mod I level with use of BUE, independent for management of w/c parts. Sit to stand with CGA to RW throughout session. Ambulation 2 x 100 ft, 1 x 170 ft with RW and min A for balance, use of L PLS AFO. Pt exhibits improved B step length this date and improved LLE clearance, occasional toe catching but able to improve with cues for heel strike during gait. Ascend/descend 8 x 6" stairs with 2 handrails and min A for balance forwards, 4 x 6" stairs with R handrail laterally with min A for balance. Pt left seated in w/c in room with needs in reach, chair alarm in place at end of session.  Session 2: Pt received seated in w/c in room, agreeable to PT session. No complaints of pain. Manual w/c propulsion x 100 ft with use of BUE at mod I level. Sit to stand with CGA to RW throughout session. Ambulation with RW through obstacle course x 20 ft, x 40 ft with CGA for balance weaving through cones, cues for safe RW management. Ambulation x 100 ft with RW and CGA for balance with L AFO. Pt left seated in w/c in room with needs in reach, chair alarm in place at end of session.  Therapy Documentation Precautions:  Precautions Precautions: Fall Precaution Comments: ataxic LEs, fall  risk Restrictions Weight Bearing Restrictions: No     Therapy/Group: Individual Therapy   Excell Seltzer, PT, DPT, CSRS  01/02/2021, 12:16 PM

## 2021-01-02 NOTE — Progress Notes (Signed)
PROGRESS NOTE   Subjective/Complaints:  Pt reports still has no pain- willing to reduce AM scheduled MS Contin- will stop and see how does- if continues, will wean off PM MS Contin.   Also, pt has new bruising on R foot- dorsum of toes- weird place to bruise, unless stepped on- doesn't remember any trauma to R foot.    ROS:  Pt denies SOB, abd pain, CP, N/V/C/D, and vision changes  Objective:   No results found. Recent Labs    01/01/21 0502 01/02/21 0604  WBC 6.1 8.4  HGB 11.8* 11.8*  HCT 35.5* 35.3*  PLT 114* 90*    Recent Labs    01/01/21 0502 01/02/21 0604  NA 132* 133*  K 4.9 4.8  CL 98 97*  CO2 26 27  GLUCOSE 115* 118*  BUN 27* 32*  CREATININE 1.24 1.40*  CALCIUM 8.6* 8.6*     Intake/Output Summary (Last 24 hours) at 01/02/2021 1113 Last data filed at 01/02/2021 0834 Gross per 24 hour  Intake 1200 ml  Output 1200 ml  Net 0 ml         Physical Exam: Vital Signs Blood pressure 132/82, pulse 69, temperature 98 F (36.7 C), resp. rate 14, weight 91 kg, SpO2 99 %.       General: awake, alert, appropriate, sitting up in bed; NAD HENT: conjugate gaze; oropharynx moist CV: regular rate; no JVD Pulmonary: CTA B/L; no W/R/R- good air movement GI: soft, NT, ND, (+)BS; normoactive Psychiatric: still very flat, but more interactive Neurological: alert Musculoskeletal:        General: No swelling.     Cervical back: Normal range of motion and neck supple.     Comments: Pt's strength in UEs 5/5 B/L in biceps, triceps, WE, grip and finger abd B/L LEs: RLE- HF 4-/5, KE 4-/5, DF 4+/5, PF 4-/5 LLE: HF 3/5, KE 4-/5, DF 3+/5, and PF 4-/5 - no change Skin:has some new bruising the top/base of 2nd-5th toes on R foot- brownish purple- no trauma - intact pulse    General: Skin is warm and dry. Skin looks great except has radiation mark as well as fungal toenails/very thickened   Neurological:      Mental Status: He is alert and oriented to person, place, and time.     Sensory: Sensory deficit present.     Motor: Weakness present.     Comments: Speech clear. Sensory deficits from waist down with paraplegia.  Sensation decreased a lot from T7 on down to S5 B/L- no change on exam today- still decreased.     Assessment/Plan: 1. Functional deficits which require 3+ hours per day of interdisciplinary therapy in a comprehensive inpatient rehab setting. Physiatrist is providing close team supervision and 24 hour management of active medical problems listed below. Physiatrist and rehab team continue to assess barriers to discharge/monitor patient progress toward functional and medical goals  Care Tool:  Bathing    Body parts bathed by patient: Right arm, Left arm, Chest, Abdomen, Front perineal area, Buttocks, Right upper leg, Left upper leg, Right lower leg, Left lower leg, Face         Bathing assist Assist Level: Set  up assist     Upper Body Dressing/Undressing Upper body dressing   What is the patient wearing?: Pull over shirt    Upper body assist Assist Level: Independent    Lower Body Dressing/Undressing Lower body dressing      What is the patient wearing?: Underwear/pull up, Pants     Lower body assist Assist for lower body dressing: Contact Guard/Touching assist     Toileting Toileting    Toileting assist Assist for toileting: Contact Guard/Touching assist     Transfers Chair/bed transfer  Transfers assist     Chair/bed transfer assist level: Minimal Assistance - Patient > 75%     Locomotion Ambulation   Ambulation assist      Assist level: Minimal Assistance - Patient > 75% Assistive device: Walker-rolling Max distance: 100'   Walk 10 feet activity   Assist     Assist level: Minimal Assistance - Patient > 75% Assistive device: Walker-rolling   Walk 50 feet activity   Assist Walk 50 feet with 2 turns activity did not occur:  Safety/medical concerns  Assist level: Minimal Assistance - Patient > 75% Assistive device: Walker-rolling    Walk 150 feet activity   Assist Walk 150 feet activity did not occur: Safety/medical concerns         Walk 10 feet on uneven surface  activity   Assist Walk 10 feet on uneven surfaces activity did not occur: Safety/medical concerns         Wheelchair     Assist Will patient use wheelchair at discharge?: Yes Type of Wheelchair: Manual    Wheelchair assist level: Independent Max wheelchair distance: 150'    Wheelchair 50 feet with 2 turns activity    Assist        Assist Level: Independent   Wheelchair 150 feet activity     Assist      Assist Level: Independent   Blood pressure 132/82, pulse 69, temperature 98 F (36.7 C), resp. rate 14, weight 91 kg, SpO2 99 %.  Medical Problem List and Plan: 1.  T6 paraplegia ASIA D secondary to mets to Spinal cord/tumors compressing [place]/prostate CA             -patient may  shower             -ELOS/Goals:10-14 days- goals supervision  Continue PT and OT- Radiation to actually finish Monday- they changed it.    con't PT and OT- asking if will walk "again"- explained we cannot tell, but he's walking some with RW- which is better than expected.   Con't PT and OT-  2.  Antithrombotics: -DVT/anticoagulation:  Pharmaceutical: Lovenox  -will need for at least 2 months due to [place], no matter how far he's walking             -antiplatelet therapy: N/a 3. Pain Management: MS contin bid with oxycodone prn. Continue and change as required  6/6- not taking prns- just scheduled pain meds- con't regimen  6/8- pain 4/10- controlled per pt- con't regimen- not taking prns.   6/9- pt doesn't want to change regimen- con't medspain   6/10- pt reports for last 24 hours, pain 0/10- no pain- if continues, will wean long acting pain meds. 6/11- will stop AM MS Contin as of tomorrow AM- and monitor- has prns if  needed  4. Mood: LCSW to follow for evaluation and support.              -antipsychotic agents: N/a 5.  Neuropsych: This patient is capable of making decisions on his own behalf. 6. Skin/Wound Care: Routine pressure relief measures.  7. Fluids/Electrolytes/Nutrition: Monitor I/O. Check lytes in am.  8. HTN: Monitor BP tid--continue Norvasc.              --Resume Cozaar if BP continues to be labile    6/1- BP 139/79 this AM- overall borderline high- will wait to restart Cozaar- Of note, HR is 57 this AM  6/2- will restart Cozaar- 25 mg daily- will restart since BP is still elevated in 150s.   6/7- BP well controlled- con't regimen  6/8- BP 150s/90s this AM- usually controlled- will monitor for trend. 6/9- also elevated today- if isn't down by tomorrow, will increase Cozaar. 6/11- BP controlled 130s/80s- con't regimen Vitals:   01/01/21 2009 01/02/21 0534  BP: 120/68 132/82  Pulse: 80 69  Resp: 16 14  Temp: 98.4 F (36.9 C) 98 F (36.7 C)  SpO2: 97% 99%    9. Metastatic prostate cancer to spine: XRT ongoing             --slow steroid taper--4 mg tid X 3 days followed by bid X 7 days-->daily X 7 days, then 2 mg daily X 7d days.   10. At risk for spasticity and neurogenic bowel and bladder- will scan bladder to make sure emptying and monitor bowels.    6/1- Pt reports small BM overnight- con't to monitor for urinary retention- as well as con't bowel meds for constipation.   6/3- LBM overnight- con't meds  6/4-6/5: continue laxatives  6/6- refusing laxatives, but still bleeding hemorrhoids con't to encourage usage.   6/7- LBM early this AM- con't regimen  6/11- refusing laxatives- con't regimen 11. L foot drop  6/1- will make sure has PRAFO ordered for L and wear at night.  12. Hyperkalemia  6/2- will give dose of Lokelma 10G x1- and will recheck in AM  6/3- K+ down to 5.0- will recheck Monday- might need Lokelma more frequently.   6/6- K+ 4.6 today-con't to monitor  6/9- K+ 4.7  today- will recheck Monday.   6/11- will recheck labs in AM 6/13 resolved 13. Hemorrhoids  6/3- will order Tucks and Anusol suppository since bleeding with BMs.     6/6- refusing miralax as well as anusol suppository even though he's still bleeding. 14. Depressed mood/flat  6/8- will start Celexa 10 mg daily and monitor for side effects.   6/9- pt refusing Celexa- will stop.  15. Azotemia  6/9- pt's Cr up to 1.40 from 1.18- will push fluids- and recheck Monday.   6/10- encouraged pt to drink 6-8 cups /day -admits was drinking 2-3 cups/day- will recheck Monday.  16. Bruising of R foot  6/11- will check platelet level- was last clumping- be that was low- could be cause- will check in AM 17.  Cough - non productive, no SOB, afebrile, WBC normal , will tx symptomatically at this point , Recent CT chest owed mild centrilobular emphysema no other lung abnormalities   LOS: 13 days A FACE TO FACE EVALUATION WAS PERFORMED  Charlett Blake 01/02/2021, 11:13 AM

## 2021-01-02 NOTE — Progress Notes (Signed)
Occupational Therapy Session Note  Patient Details  Name: Terry Mitchell MRN: [id number] Date of Birth: 11-01-1950  Today's Date: 01/02/2021 OT Individual Time: 0700-0810 OT Individual Time Calculation (min): 70 min    Short Term Goals: Week 2:  OT Short Term Goal 1 (Week 2): Pt will complete toileting tasks with min A OT Short Term Goal 2 (Week 2): Pt will perform toilet tranfsers with supervision OT Short Term Goal 3 (Week 2): Pt will pull pants over hips standing with CGA  Skilled Therapeutic Interventions/Progress Updates:    Pt resting in bed upon arrival. Pt declined shower this morning.  All squat pivot transfers during session at supervision level. OT intervention with focus on sit<>stand and standing balance without UE support. Pt started with 7 mins NuStep level 5 BLE only. Pt amb with RW ~5' to [facility] and practiced sit<>stand pushing up from Bil knees with focus on eccentric control when sitting down. Pt completed 4x8. Pt transitioned to main gym and engaged in table tasks without BUE support to move small items on table and easel. Pt completed all tasks with CGA. Pt returned to room and remained seated in w/c with all needs within reach and seat alarm activated.   Therapy Documentation Precautions:  Precautions Precautions: Fall Precaution Comments: ataxic LEs, fall risk Restrictions Weight Bearing Restrictions: No Pain: Pain Assessment Pain Scale: 0-10 Pain Score: 0-No pain  Therapy/Group: Individual Therapy  Leroy Libman 01/02/2021, 8:15 AM

## 2021-01-02 NOTE — Progress Notes (Signed)
Bruising unchanged to right 4th toe and dorsum and plantar aspect surrounding the 4th toe. Also, bruising to right lateral foot. Patient denies pain or hitting foot. New cough, patient reports started Sunday morning. Congested, NP cough. Increased edema to BLE's, left > right. Left PRAFO boot applied at HS. Terry Mitchell A

## 2021-01-03 MED ORDER — AMLODIPINE BESYLATE 10 MG PO TABS
10.0000 mg | ORAL_TABLET | Freq: Every day | ORAL | Status: DC
  Administered 2021-01-04 – 2021-01-05 (×2): 10 mg via ORAL
  Filled 2021-01-03 (×2): qty 1

## 2021-01-03 MED ORDER — AMLODIPINE BESYLATE 5 MG PO TABS
5.0000 mg | ORAL_TABLET | Freq: Once | ORAL | Status: AC
  Administered 2021-01-03: 5 mg via ORAL
  Filled 2021-01-03: qty 1

## 2021-01-03 NOTE — Progress Notes (Signed)
Physical Therapy Session Note  Patient Details  Name: Terry Mitchell MRN: [id number] Date of Birth: 04/15/1951  Today's Date: 01/03/2021 PT Individual Time: 0920-1015; 1125-1200; 1630-1700 PT Individual Time Calculation (min): 55 min and 35 min and 30 min  Short Term Goals: Week 2:  PT Short Term Goal 1 (Week 2): Pt will ambulate x 100 ft with LRAD and min A PT Short Term Goal 2 (Week 2): Pt will perform least restrictive transfer with Supervision consistently PT Short Term Goal 3 (Week 2): Pt will navigate 6" stairs with one handrail and min A   Skilled Therapeutic Interventions/Progress Updates:    Session 1: Pt received seated in w/c in room, agreeable to PT session. No complaints of pain. Manual w/c propulsion at mod I level throughout session. Sit to stand with CGA to RW throughout session. Ambulation 1 x 100 ft, 2 x 160 ft with RW and CGA with no AFO this session. Pt exhibits improved LLE management during gait, does require cues for increased BLE heel strike and for increased RLE step length. Pt continues to exhibit ongoing BLE edema, Pam Love, PA notified and will put in order for TEDs. Assisted pt with donning knee high TEDs for edema management. Alt L/R 6" step ups with BUE support and min A for balance, 2 x 10 reps. Pt initially does not unlock L knee when descending step, with verbal and manual cueing exhibits improved L knee control. Pt left seated in w/c in room with needs in reach, chair alarm in place at end of session.  Session 2: Pt received seated in w/c in room, agreeable to PT session. No complaints of pain. Manual w/c propulsion at mod I level throughout session. Sit to stand with CGA to RW throughout session. Standing alt L/R cone taps with RW and CGA for balance with focus on LE coordination. Sidesteps L/R 2 x 20 ft with RW and CGA with focus on hip strengthening and LE coordination. Standing mini-squats x 10 reps, x 15 reps with no UE support and CGA. Ambulation x 150 ft  with RW and CGA with cues for heel strike and increased RLE step length. Encouraged increased PO water intake during session. Pt left seated in w/c in room with needs in reach, chair alarm in place at end of session.  Session 3: Pt received seated in w/c in room, agreeable to PT session. No complaints of pain. Manual w/c propulsion at mod I level throughout session. Sit to stand with CGA to RW throughout session. Ambulation x 200 ft with RW and CGA with cues for heel strike and increased RLE step length during gait. Sit to/from supine on real bed in therapy apartment at Supervision level. Pt reports bed is similar height to his bed at home. Sit to stand from low, pliable surface of couch with mod A to RW. Pt struggles to stand from low surface, education regarding assist needed with this and recommend patient choose more elevated surfaces to sit on upon d/c home. Static standing balance with no UE support and min A overall needed while performing ball bounce, 2 x 30 reps. Pt does well utilizing balance strategies to maintain standing balance. Pt left seated in w/c in room with needs in reach, chair alarm in place at end of session.  Therapy Documentation Precautions:  Precautions Precautions: Fall Precaution Comments: ataxic LEs, fall risk Restrictions Weight Bearing Restrictions: No    Therapy/Group: Individual Therapy   Excell Seltzer, PT, DPT, CSRS  01/03/2021, 12:18 PM

## 2021-01-03 NOTE — Progress Notes (Signed)
Patient slept throughout shift no complaints

## 2021-01-03 NOTE — Progress Notes (Signed)
PROGRESS NOTE   Subjective/Complaints:   Pt reports no issues- no pain except an electrical shock through feet last night for 30 minutes or so- but it was the first time.   Willing to stop MS contin- doesn't think needs it anymore.   ROS:  Pt denies SOB, abd pain, CP, N/V/C/D, and vision changes  Objective:   No results found. Recent Labs    01/01/21 0502 01/02/21 0604  WBC 6.1 8.4  HGB 11.8* 11.8*  HCT 35.5* 35.3*  PLT 114* 90*   Recent Labs    01/01/21 0502 01/02/21 0604  NA 132* 133*  K 4.9 4.8  CL 98 97*  CO2 26 27  GLUCOSE 115* 118*  BUN 27* 32*  CREATININE 1.24 1.40*  CALCIUM 8.6* 8.6*    Intake/Output Summary (Last 24 hours) at 01/03/2021 1048 Last data filed at 01/03/2021 0735 Gross per 24 hour  Intake 710 ml  Output 600 ml  Net 110 ml        Physical Exam: Vital Signs Blood pressure (!) 153/89, pulse 80, temperature 98.5 F (36.9 C), temperature source Oral, resp. rate 18, weight 91 kg, SpO2 91 %.        General: awake, alert, appropriate, sitting up in w/c at bedside; NAD HENT: conjugate gaze; oropharynx moist CV: regular rate; no JVD Pulmonary: CTA B/L; no W/R/R- good air movement GI: soft, NT, ND, (+)BS Psychiatric: appropriate; flat, but more interactive;  Neurological: alert  Musculoskeletal:        General: No swelling.     Cervical back: Normal range of motion and neck supple.     Comments: Pt's strength in UEs 5/5 B/L in biceps, triceps, WE, grip and finger abd B/L LEs: RLE- HF 4-/5, KE 4-/5, DF 4+/5, PF 4-/5 LLE: HF 3/5, KE 4-/5, DF 3+/5, and PF 4-/5 - no change Skin:has some new bruising the top/base of 2nd-5th toes on R foot- brownish purple- no trauma - intact pulse    General: Skin is warm and dry. Skin looks great except has radiation mark as well as fungal toenails/very thickened   Neurological:     Mental Status: He is alert and oriented to person, place,  and time.     Sensory: Sensory deficit present.     Motor: Weakness present.     Comments: Speech clear. Sensory deficits from waist down with paraplegia.  Sensation decreased a lot from T7 on down to S5 B/L- no change on exam today- still decreased.     Assessment/Plan: 1. Functional deficits which require 3+ hours per day of interdisciplinary therapy in a comprehensive inpatient rehab setting. Physiatrist is providing close team supervision and 24 hour management of active medical problems listed below. Physiatrist and rehab team continue to assess barriers to discharge/monitor patient progress toward functional and medical goals  Care Tool:  Bathing    Body parts bathed by patient: Right arm, Left arm, Chest, Abdomen, Front perineal area, Buttocks, Right upper leg, Left upper leg, Right lower leg, Left lower leg, Face         Bathing assist Assist Level: Set up assist     Upper Body Dressing/Undressing Upper body dressing  What is the patient wearing?: Pull over shirt    Upper body assist Assist Level: Independent    Lower Body Dressing/Undressing Lower body dressing      What is the patient wearing?: Underwear/pull up, Pants     Lower body assist Assist for lower body dressing: Contact Guard/Touching assist     Toileting Toileting    Toileting assist Assist for toileting: Contact Guard/Touching assist     Transfers Chair/bed transfer  Transfers assist     Chair/bed transfer assist level: Contact Guard/Touching assist     Locomotion Ambulation   Ambulation assist      Assist level: Minimal Assistance - Patient > 75% Assistive device: Walker-rolling Max distance: 170'   Walk 10 feet activity   Assist     Assist level: Minimal Assistance - Patient > 75% Assistive device: Walker-rolling   Walk 50 feet activity   Assist Walk 50 feet with 2 turns activity did not occur: Safety/medical concerns  Assist level: Minimal Assistance - Patient  > 75% Assistive device: Walker-rolling    Walk 150 feet activity   Assist Walk 150 feet activity did not occur: Safety/medical concerns  Assist level: Minimal Assistance - Patient > 75% Assistive device: Walker-rolling    Walk 10 feet on uneven surface  activity   Assist Walk 10 feet on uneven surfaces activity did not occur: Safety/medical concerns         Wheelchair     Assist Will patient use wheelchair at discharge?: Yes Type of Wheelchair: Manual    Wheelchair assist level: Independent Max wheelchair distance: 150'    Wheelchair 50 feet with 2 turns activity    Assist        Assist Level: Independent   Wheelchair 150 feet activity     Assist      Assist Level: Independent   Blood pressure (!) 153/89, pulse 80, temperature 98.5 F (36.9 C), temperature source Oral, resp. rate 18, weight 91 kg, SpO2 91 %.  Medical Problem List and Plan: 1.  T6 paraplegia ASIA D secondary to mets to Spinal cord/tumors compressing [place]/prostate CA             -patient may  shower             -ELOS/Goals:10-14 days- goals supervision  Continue PT and OT- Radiation to actually finish Monday- they changed it.    con't PT and OT- asking if will walk "again"- explained we cannot tell, but he's walking some with RW- which is better than expected.   Con't CIR- PT and OT 2.  Antithrombotics: -DVT/anticoagulation:  Pharmaceutical: Lovenox  -will need for at least 2 months due to [name], no matter how far he's walking             -antiplatelet therapy: N/a 3. Pain Management: MS contin bid with oxycodone prn. Continue and change as required  6/6- not taking prns- just scheduled pain meds- con't regimen  6/8- pain 4/10- controlled per pt- con't regimen- not taking prns.   6/9- pt doesn't want to change regimen- con't medspain   6/10- pt reports for last 24 hours, pain 0/10- no pain- if continues, will wean long acting pain meds. 6/11- will stop AM MS Contin as of  tomorrow AM- and monitor- has prns if needed   6/14- will stop QHS Ms Contin- if has pain, has prns- if has electrical sensations again, might need nerve pain meds.  4. Mood: LCSW to follow for evaluation and support.              -  antipsychotic agents: N/a 5. Neuropsych: This patient is capable of making decisions on his own behalf. 6. Skin/Wound Care: Routine pressure relief measures.  7. Fluids/Electrolytes/Nutrition: Monitor I/O. Check lytes in am.  8. HTN: Monitor BP tid--continue Norvasc.              --Resume Cozaar if BP continues to be labile    6/1- BP 139/79 this AM- overall borderline high- will wait to restart Cozaar- Of note, HR is 57 this AM  6/2- will restart Cozaar- 25 mg daily- will restart since BP is still elevated in 150s.   6/7- BP well controlled- con't regimen  6/8- BP 150s/90s this AM- usually controlled- will monitor for trend. 6/9- also elevated today- if isn't down by tomorrow, will increase Cozaar. 6/11- BP controlled 130s/80s- con't regimen Vitals:   01/02/21 2000 01/03/21 0548  BP: 130/81 (!) 153/89  Pulse: 73 80  Resp: 17 18  Temp: 98 F (36.7 C) 98.5 F (36.9 C)  SpO2: 100% 91%  6/14- BP a little elevated- but Cozaar also could be causing Cr issue- will increase Norvasc to 10 mg daily and stop Cozaar.  And recheck labs Thursday- if BUN still going up, will need IVFs.  9. Metastatic prostate cancer to spine: XRT ongoing             --slow steroid taper--4 mg tid X 3 days followed by bid X 7 days-->daily X 7 days, then 2 mg daily X 7d days.   10. At risk for spasticity and neurogenic bowel and bladder- will scan bladder to make sure emptying and monitor bowels.    6/1- Pt reports small BM overnight- con't to monitor for urinary retention- as well as con't bowel meds for constipation.   6/3- LBM overnight- con't meds  6/4-6/5: continue laxatives  6/6- refusing laxatives, but still bleeding hemorrhoids con't to encourage usage.   6/7- LBM early this AM-  con't regimen  6/11- refusing laxatives- con't regimen 11. L foot drop  6/1- will make sure has PRAFO ordered for L and wear at night.  12. Hyperkalemia  6/2- will give dose of Lokelma 10G x1- and will recheck in AM  6/3- K+ down to 5.0- will recheck Monday- might need Lokelma more frequently.   6/6- K+ 4.6 today-con't to monitor  6/9- K+ 4.7 today- will recheck Monday.   6/11- will recheck labs in AM 6/14- K+ 4.9- will recheck THursday 13. Hemorrhoids  6/3- will order Tucks and Anusol suppository since bleeding with BMs.     6/6- refusing miralax as well as anusol suppository even though he's still bleeding. 14. Depressed mood/flat  6/8- will start Celexa 10 mg daily and monitor for side effects.   6/9- pt refusing Celexa- will stop.  15. Azotemia  6/9- pt's Cr up to 1.40 from 1.18- will push fluids- and recheck Monday.   6/10- encouraged pt to drink 6-8 cups /day -admits was drinking 2-3 cups/day- will recheck Monday.  16. Bruising of R foot  6/11- will check platelet level- was last clumping- be that was low- could be cause- will check in AM  6/14- Plts 90k this AM- was 114k yesterday- will recheck Thursday.  17.  Cough - non productive, no SOB, afebrile, WBC normal , will tx symptomatically at this point , Recent CT chest owed mild centrilobular emphysema no other lung abnormalities   LOS: 14 days A FACE TO FACE EVALUATION WAS PERFORMED  Terry Mitchell 01/03/2021, 10:48 AM

## 2021-01-03 NOTE — Progress Notes (Signed)
Occupational Therapy Session Note  Patient Details  Name: Terry Mitchell MRN: [id number] Date of Birth: April 04, 1951  Today's Date: 01/03/2021 OT Individual Time: 4098-1191 OT Individual Time Calculation (min): 75 min    Short Term Goals: Week 3:  OT Short Term Goal 1 (Week 3): STG=LTG secondary to ELOS  Skilled Therapeutic Interventions/Progress Updates:    OT intervention with focus on bathing at shower level and dressing with sit<>stand from w/c . See Care Tool for ADL assist. Pt propelled w/c to gym and engaged in standing activities with NO UE support. Pt stood at [employer] and tossed soccer ball against rebounder 3x10 without UE support-CGA. Pt propelled w/c to room. Pt remained in w/c with all needs within reach. Seat alarm activated.  Therapy Documentation Precautions:  Precautions Precautions: Fall Precaution Comments: ataxic LEs, fall risk Restrictions Weight Bearing Restrictions: No  Pain:  Pt denies pain this morning   Therapy/Group: Individual Therapy  Leroy Libman 01/03/2021, 12:08 PM

## 2021-01-03 NOTE — Patient Care Conference (Signed)
Inpatient RehabilitationTeam Conference and Plan of Care Update Date: 01/03/2021   Time: 11:10 AM    Patient Name: Terry Mitchell      Medical Record Number: [id number]  Date of Birth: 03-01-1951 Sex: Male         Room/Bed: 4W22C/4W22C-01 Payor Info: Payor: [employer] MEDICARE / Plan: [place] HMO / Product Type: *No Product type* /    Admit Date/Time:  12/20/2020  2:31 PM  Primary Diagnosis:  Acute incomplete paraplegia [facility])  Hospital Problems: Principal Problem:   Acute incomplete paraplegia [facility]) Active Problems:   Prostate cancer metastatic to bone [facility])    Expected Discharge Date: Expected Discharge Date: 01/12/21  Team Members Present: Physician leading conference: Dr. Courtney Heys Care Coodinator Present: Dorthula Nettles, RN, BSN, CRRN;Becky Dupree, LCSW Nurse Present: Dorthula Nettles, RN PT Present: Excell Seltzer, PT OT Present: Roanna Epley, [place], OT PPS Coordinator present : Ileana Ladd, PT     Current Status/Progress Goal Weekly Team Focus  Bowel/Bladder   Continent of B/B miralox bid refuses hs doese, anusol as needed LBM 06/13  Remain continent of B/B  toilet prn assess for constipation and worsening of hemorrhoids   Swallow/Nutrition/ Hydration             ADL's   bathing at shower level-supervision with lateral leans; dressing-CGA for standing balance; squat pivot transfers-supervision; SPT-min A; toileting-CGA  supervision overall  standing balance, functional transfers, education; toleting   Mobility   CGA sit to stand and transfers with RW, gait up to 170 ft with RW CGA to min A, min A stairs with 1-2 handrails  Supervision transfers, min A gait and stairs (will upgrade goals to Supervision overall)  gait training, LE NMR, stairs   Communication             Safety/Cognition/ Behavioral Observations            Pain   denies  pain free or managed pain  assess pain qshift and before therapies   Skin   skin intact  remains intact   assess every shift and prn     Discharge Planning:  D/c to home with 24/7 care from various family members. Last radiation tx on 6/6.   Team Discussion: Stopped MS Contin, push fluids, close to needing IVF. Stopping Cozaar, starting Norvasc. Continent B/B, refusing Miralax. Bruising to the right toes. Patient on target to meet rehab goals: yes, contact guard with RW 170 ft. Min assist stairs. Impaired proprioception. Lateral leans can do it all with ADL's. Working on standing balance with contact guard.  *See Care Plan and progress notes for long and short-term goals.   Revisions to Treatment Plan:  MD monitoring labs.  Teaching Needs: Family education, medication management, skin/wound care, transfer training, gait training, balance training, endurance training, stair training, safety awareness.  Current Barriers to Discharge: Decreased caregiver support, Medical stability, Home enviroment access/layout, Wound care, Lack of/limited family support, Weight, Weight bearing restrictions, Medication compliance, Pending chemo/radiation, and Nutritional means  Possible Resolutions to Barriers: Continue current medications, provide emotional support.     Medical Summary Current Status: PLts 90k (was 114k); electrical pain last night- refuses miralax; continent B/B- bruising R toes; will need Lovenox x2+ months.  Barriers to Discharge: Behavior;Decreased family/caregiver support;Home enviroment access/layout;Medical stability;Medication compliance;Nutrition means;Weight bearing restrictions;Pending chemo/radiation   Possible Resolutions to [employer] Focus: Focus; Cr 1.4; BUN >30; push fluids vs IVFs Thursday- labs Thursday- might not need AFO on LLE; d/c MS Contin QHS- has prns;  nerve pain?will monitor- d/c 6/23   Continued Need for Acute Rehabilitation Level of Care: The patient requires daily medical management by a physician with specialized training in physical medicine and  rehabilitation for the following reasons: Direction of a multidisciplinary physical rehabilitation program to maximize functional independence : Yes Medical management of patient stability for increased activity during participation in an intensive rehabilitation regime.: Yes Analysis of laboratory values and/or radiology reports with any subsequent need for medication adjustment and/or medical intervention. : Yes   I attest that I was present, lead the team conference, and concur with the assessment and plan of the team.   Cristi Loron 01/03/2021, 4:55 PM

## 2021-01-03 NOTE — Progress Notes (Signed)
Patient ID: Terry Mitchell, male   DOB: 07/29/1950, 70 y.o.   MRN: [id number]  Met with pt to inform of team conference and his progress has made this week. He is happy with this also and hopes to continue. Discussed having family come in next week for education and made aware Auria-LCSW will be back Thursday to schedule this. 

## 2021-01-03 NOTE — Plan of Care (Signed)
  Problem: RH Ambulation Goal: LTG Patient will ambulate in controlled environment (PT) Description: LTG: Patient will ambulate in a controlled environment, # of feet with assistance (PT). Flowsheets (Taken 01/03/2021 0759) LTG: Pt will ambulate in controlled environ  assist needed:: (upgrade due to progress) Supervision/Verbal cueing Note: upgrade due to progress Goal: LTG Patient will ambulate in home environment (PT) Description: LTG: Patient will ambulate in home environment, # of feet with assistance (PT). Flowsheets (Taken 01/03/2021 0759) LTG: Pt will ambulate in home environ  assist needed:: (upgrade due to progress) Supervision/Verbal cueing Note: upgrade due to progress   Problem: RH Stairs Goal: LTG Patient will ambulate up and down stairs w/assist (PT) Description: LTG: Patient will ambulate up and down # of stairs with assistance (PT) Flowsheets (Taken 01/03/2021 0759) LTG: Pt will ambulate up/down stairs assist needed:: (upgrade due to progress) Supervision/Verbal cueing Note: upgrade due to progress

## 2021-01-03 NOTE — Progress Notes (Signed)
Pt sleeping throughout the night. Remains resting without complaint

## 2021-01-03 NOTE — Progress Notes (Signed)
Occupational Therapy Weekly Progress Note  Patient Details  Name: Terry Mitchell MRN: [id number] Date of Birth: [birth year]  Beginning of progress report period: December 27, 2020 End of progress report period: January 03, 2021   Patient has met 3 of 3 short term goals.  Pt made excellent progress with BADLs and functional transfers during the past week. Pt completes bathing tasks at shower level with supervision using lateral leans to bathe buttocks. Pt requires CGA for standing balance when pulling pants over hips but is able to pull pants over hips using lateral leans with supervision. Squat pivot transfers with supervision and min verbal cues for w/c setup. Stand pivot transfers with RW requiring CGA and min verbal cues.   Patient continues to demonstrate the following deficits: muscle weakness, decreased cardiorespiratoy endurance, and decreased sitting balance, decreased standing balance, and decreased balance strategies and therefore will continue to benefit from skilled OT intervention to enhance overall performance with BADL and Reduce care partner burden.  Patient progressing toward long term goals..  Continue plan of care.  OT Short Term Goals Week 2:  OT Short Term Goal 1 (Week 2): Pt will complete toileting tasks with min A OT Short Term Goal 1 - Progress (Week 2): Met OT Short Term Goal 2 (Week 2): Pt will perform toilet tranfsers with supervision OT Short Term Goal 2 - Progress (Week 2): Met OT Short Term Goal 3 (Week 2): Pt will pull pants over hips standing with CGA OT Short Term Goal 3 - Progress (Week 2): Met Week 3:  OT Short Term Goal 1 (Week 3): STG=LTG secondary to ELOS  S Leroy Libman 01/03/2021, 6:24 AM

## 2021-01-03 NOTE — Progress Notes (Signed)
Physical Therapy Weekly Progress Note  Patient Details  Name: Terry Mitchell MRN: [id number] Date of Birth: [birth year]  Beginning of progress report period: December 28, 2020 End of progress report period: January 03, 2021  Today's Date: 01/03/2021  Patient has met 2 of 3 short term goals. Patient is making great progress towards therapy goals. Pt is Supervision for bed mobility, CGA for transfers with RW, CGA to min A for gait up to 180 ft with RW, min A for stair management with one rail, and at mod I level for w/c mobility. Pt continues to exhibit some ataxia in his BLE but continues to show great improvement in overall function each session.  Patient continues to demonstrate the following deficits muscle weakness, decreased cardiorespiratoy endurance, impaired timing and sequencing, abnormal tone, unbalanced muscle activation, ataxia, and decreased coordination, and decreased standing balance, decreased postural control, and decreased balance strategies and therefore will continue to benefit from skilled PT intervention to increase functional independence with mobility.  Patient progressing toward long term goals..  Plan of care revisions: upgraded to Supervision level overall.  PT Short Term Goals Week 2:  PT Short Term Goal 1 (Week 2): Pt will ambulate x 100 ft with LRAD and min A PT Short Term Goal 1 - Progress (Week 2): Met PT Short Term Goal 2 (Week 2): Pt will perform least restrictive transfer with Supervision consistently PT Short Term Goal 2 - Progress (Week 2): Progressing toward goal PT Short Term Goal 3 (Week 2): Pt will navigate 6" stairs with one handrail and min A PT Short Term Goal 3 - Progress (Week 2): Met Week 3:  PT Short Term Goal 1 (Week 3): =LTG due to ELOS   Therapy Documentation Precautions:  Precautions Precautions: Fall Precaution Comments: ataxic LEs, fall risk Restrictions Weight Bearing Restrictions: No   Therapy/Group: Individual Therapy   Excell Seltzer, PT, DPT, CSRS 01/03/2021, 7:56 AM

## 2021-01-04 NOTE — Progress Notes (Signed)
PROGRESS NOTE   Subjective/Complaints:  Pt reports had some unexpected pain that woke him up overnight- was 6-7/10- very bothersome- however took prn pain meds and was very helpful.  Pain was around R knee/distal thigh- is new.  Hasn't had before.  Bowels working well per pt.   ROS:  Pt denies SOB, abd pain, CP, N/V/C/D, and vision changes    Objective:   No results found. Recent Labs    01/02/21 0604  WBC 8.4  HGB 11.8*  HCT 35.3*  PLT 90*   Recent Labs    01/02/21 0604  NA 133*  K 4.8  CL 97*  CO2 27  GLUCOSE 118*  BUN 32*  CREATININE 1.40*  CALCIUM 8.6*    Intake/Output Summary (Last 24 hours) at 01/04/2021 1550 Last data filed at 01/04/2021 1420 Gross per 24 hour  Intake 929 ml  Output 1075 ml  Net -146 ml        Physical Exam: Vital Signs Blood pressure 117/65, pulse 84, temperature 98.4 F (36.9 C), temperature source Oral, resp. rate 18, weight 89.4 kg, SpO2 100 %.    General: awake, alert, appropriate, sitting up in bedside chair, NAD HENT: conjugate gaze; oropharynx moist CV: regular rate; no JVD Pulmonary: CTA B/L; no W/R/R- good air movement GI: soft, NT, ND, (+)BS Psychiatric: appropriate- but very flat- smiled x1 today Neurological: alert Musculoskeletal:        General: No swelling.     Cervical back: Normal range of motion and neck supple.     Comments: Pt's strength in UEs 5/5 B/L in biceps, triceps, WE, grip and finger abd B/L LEs: RLE- HF 4-/5, KE 4-/5, DF 4+/5, PF 4-/5 LLE: HF 3/5, KE 4-/5, DF 3+/5, and PF 4-/5 - LLE very slightly better- but not quite up on points.  Skin:has some new bruising the top/base of 2nd-5th toes on R foot- brownish purple- no trauma - intact pulse    General: Skin is warm and dry. Skin looks great except has radiation mark as well as fungal toenails/very thickened   Neurological:     Mental Status: He is alert and oriented to person, place,  and time.     Sensory: Sensory deficit present.     Motor: Weakness present.     Comments: Speech clear. Sensory deficits from waist down with paraplegia.  Sensation decreased a lot from T7 on down to S5 B/L- no change on exam today- still decreased.     Assessment/Plan: 1. Functional deficits which require 3+ hours per day of interdisciplinary therapy in a comprehensive inpatient rehab setting. Physiatrist is providing close team supervision and 24 hour management of active medical problems listed below. Physiatrist and rehab team continue to assess barriers to discharge/monitor patient progress toward functional and medical goals  Care Tool:  Bathing    Body parts bathed by patient: Right arm, Left arm, Chest, Abdomen, Front perineal area, Buttocks, Right upper leg, Left upper leg, Right lower leg, Left lower leg, Face         Bathing assist Assist Level: Set up assist     Upper Body Dressing/Undressing Upper body dressing   What is the patient wearing?:  Pull over shirt    Upper body assist Assist Level: Independent    Lower Body Dressing/Undressing Lower body dressing      What is the patient wearing?: Underwear/pull up, Pants     Lower body assist Assist for lower body dressing: Contact Guard/Touching assist     Toileting Toileting    Toileting assist Assist for toileting: Contact Guard/Touching assist     Transfers Chair/bed transfer  Transfers assist     Chair/bed transfer assist level: Contact Guard/Touching assist     Locomotion Ambulation   Ambulation assist      Assist level: Minimal Assistance - Patient > 75% Assistive device: Walker-rolling Max distance: 250 ft   Walk 10 feet activity   Assist     Assist level: Minimal Assistance - Patient > 75% Assistive device: Walker-rolling   Walk 50 feet activity   Assist Walk 50 feet with 2 turns activity did not occur: Safety/medical concerns  Assist level: Minimal Assistance -  Patient > 75% Assistive device: Walker-rolling    Walk 150 feet activity   Assist Walk 150 feet activity did not occur: Safety/medical concerns  Assist level: Minimal Assistance - Patient > 75% Assistive device: Walker-rolling    Walk 10 feet on uneven surface  activity   Assist Walk 10 feet on uneven surfaces activity did not occur: Safety/medical concerns         Wheelchair     Assist Will patient use wheelchair at discharge?: Yes Type of Wheelchair: Manual    Wheelchair assist level: Independent Max wheelchair distance: 150'    Wheelchair 50 feet with 2 turns activity    Assist        Assist Level: Independent   Wheelchair 150 feet activity     Assist      Assist Level: Independent   Blood pressure 117/65, pulse 84, temperature 98.4 F (36.9 C), temperature source Oral, resp. rate 18, weight 89.4 kg, SpO2 100 %.  Medical Problem List and Plan: 1.  T6 paraplegia ASIA D secondary to mets to Spinal cord/tumors compressing [place]/prostate CA             -patient may  shower             -ELOS/Goals:10-14 days- goals supervision  Continue PT and OT- Radiation to actually finish Monday- they changed it.    con't PT and OT- asking if will walk "again"- explained we cannot tell, but he's walking some with RW- which is better than expected.   Con't CIR-PT and OT 2.  Antithrombotics: -DVT/anticoagulation:  Pharmaceutical: Lovenox  -will need for at least 2 months due to [place], no matter how far he's walking 6/15- will teach pt/family about Lovenox starting tomorrow- will place order.              -antiplatelet therapy: N/a 3. Pain Management: MS contin bid with oxycodone prn. Continue and change as required  6/6- not taking prns- just scheduled pain meds- con't regimen  6/8- pain 4/10- controlled per pt- con't regimen- not taking prns.   6/9- pt doesn't want to change regimen- con't medspain   6/10- pt reports for last 24 hours, pain 0/10- no  pain- if continues, will wean long acting pain meds. 6/11- will stop AM MS Contin as of tomorrow AM- and monitor- has prns if needed   6/14- will stop QHS Ms Contin- if has pain, has prns- if has electrical sensations again, might need nerve pain meds.   6/1hasn't had  more electrical pain, just aching/throbbing- con't prn pain meds 4. Mood: LCSW to follow for evaluation and support.              -antipsychotic agents: N/a 5. Neuropsych: This patient is capable of making decisions on his own behalf. 6. Skin/Wound Care: Routine pressure relief measures.  7. Fluids/Electrolytes/Nutrition: Monitor I/O. Check lytes in am.  8. HTN: Monitor BP tid--continue Norvasc.              --Resume Cozaar if BP continues to be labile    6/1- BP 139/79 this AM- overall borderline high- will wait to restart Cozaar- Of note, HR is 57 this AM  6/2- will restart Cozaar- 25 mg daily- will restart since BP is still elevated in 150s.   6/7- BP well controlled- con't regimen  6/8- BP 150s/90s this AM- usually controlled- will monitor for trend. 6/9- also elevated today- if isn't down by tomorrow, will increase Cozaar. 6/11- BP controlled 130s/80s- con't regimen Vitals:   01/04/21 0738 01/04/21 1449  BP: 112/69 117/65  Pulse: 67 84  Resp:  18  Temp:  98.4 F (36.9 C)  SpO2:  100%  6/14- BP a little elevated- but Cozaar also could be causing Cr issue- will increase Norvasc to 10 mg daily and stop Cozaar.  And recheck labs Thursday- if BUN still going up, will need IVFs.  6/15- BP much better 110s/70s- con't regimen on increased Norvasc 9. Metastatic prostate cancer to spine: XRT ongoing             --slow steroid taper--4 mg tid X 3 days followed by bid X 7 days-->daily X 7 days, then 2 mg daily X 7d days.   10. At risk for spasticity and neurogenic bowel and bladder- will scan bladder to make sure emptying and monitor bowels.    6/1- Pt reports small BM overnight- con't to monitor for urinary retention- as well as  con't bowel meds for constipation.   6/3- LBM overnight- con't meds  6/4-6/5: continue laxatives  6/6- refusing laxatives, but still bleeding hemorrhoids con't to encourage usage.   6/7- LBM early this AM- con't regimen  6/11- refusing laxatives- con't regimen 11. L foot drop  6/1- will make sure has PRAFO ordered for L and wear at night.  12. Hyperkalemia  6/2- will give dose of Lokelma 10G x1- and will recheck in AM  6/3- K+ down to 5.0- will recheck Monday- might need Lokelma more frequently.   6/6- K+ 4.6 today-con't to monitor  6/9- K+ 4.7 today- will recheck Monday.   6/11- will recheck labs in AM 6/14- K+ 4.9- will recheck THursday 13. Hemorrhoids  6/3- will order Tucks and Anusol suppository since bleeding with BMs.     6/6- refusing miralax as well as anusol suppository even though he's still bleeding. 14. Depressed mood/flat  6/8- will start Celexa 10 mg daily and monitor for side effects.   6/9- pt refusing Celexa- will stop.  15. Azotemia  6/9- pt's Cr up to 1.40 from 1.18- will push fluids- and recheck Monday.   6/10- encouraged pt to drink 6-8 cups /day -admits was drinking 2-3 cups/day- will recheck Monday.   6/15- will recheck Monday since Cr edging upwards 16. Bruising of R foot  6/11- will check platelet level- was last clumping- be that was low- could be cause- will check in AM  6/14- Plts 90k this AM- was 114k yesterday- will recheck Thursday.  17.  Cough - non productive, no  SOB, afebrile, WBC normal , will tx symptomatically at this point , Recent CT chest owed mild centrilobular emphysema no other lung abnormalities   LOS: 15 days A FACE TO FACE EVALUATION WAS PERFORMED  Navi Erber 01/04/2021, 3:50 PM

## 2021-01-04 NOTE — Progress Notes (Signed)
Occupational Therapy Session Note  Patient Details  Name: Terry Mitchell MRN: [id number] Date of Birth: [birth year]  Today's Date: 01/04/2021 OT Individual Time: 8159-4707 OT Individual Time Calculation (min): 58 min    Short Term Goals: Week 1:  OT Short Term Goal 1 (Week 1): Pt will complete toilet/BSC transfers with LRAD with Mod A consistently OT Short Term Goal 1 - Progress (Week 1): Met OT Short Term Goal 2 (Week 1): Pt will complete sit <> stands with Min A at LRAD in prep for ADL OT Short Term Goal 2 - Progress (Week 1): Met OT Short Term Goal 3 (Week 1): Pt will stand for 1-3 mins with unilateral support to improve LB ADL OT Short Term Goal 3 - Progress (Week 1): Met OT Short Term Goal 4 (Week 1): Pt will complete LB dressing w/ no more than Min A OT Short Term Goal 4 - Progress (Week 1): Met Week 2:  OT Short Term Goal 1 (Week 2): Pt will complete toileting tasks with min A OT Short Term Goal 1 - Progress (Week 2): Met OT Short Term Goal 2 (Week 2): Pt will perform toilet tranfsers with supervision OT Short Term Goal 2 - Progress (Week 2): Met OT Short Term Goal 3 (Week 2): Pt will pull pants over hips standing with CGA OT Short Term Goal 3 - Progress (Week 2): Met  Skilled Therapeutic Interventions/Progress Updates:    Pt received in bed in room and consented to OT tx. Pt requested to wash his clothes today as he had an accident this morning. Pt completed squat pivot transfer with no device from EOB to w/c with SUP. Session focused on standing tolerance sink side as pt hand washed clothes, required multiple seated rest breaks due to fatigue. Pt instructed to use towel to wring out clothes to work on grip strength with good carryover. Pt then completed oral hygiene routine with setup. Pt instructed in toilet transfer with CGA, cuing to manage clothes before sitting down on toilet with CGA. Pt able to complete hygiene with SUP while seated. Pt donned footwear with setup and then  taken down to therapy gym in w/c for time mgmt. Pt instructed in [facility] while standing at elevated table to increase standing tolerance and [facility] for ADLs. Pt required cuing to extend legs as he stands with his knees flexed in a squatting position. Pt then instructed in seated BUE strengthening HEP with 4# db held in both hands. Instructed in shoulder press and elbow flexion for 3x15 with min cuing for proper technique with good carryover. After tx, pt helped back to room, left up in w/c with chair alarm on and all needs in reach.   Therapy Documentation Precautions:  Precautions Precautions: Fall Precaution Comments: ataxic LEs, fall risk Restrictions Weight Bearing Restrictions: No General:   Vital Signs: Therapy Vitals Pulse Rate: 67 BP: 112/69 Pain: Pain Assessment Pain Scale: 0-10 Pain Score: 0-No pain Pain Type: Chronic pain Pain Location: Leg Pain Orientation: Right;Left Pain Intervention(s): Medication (See eMAR)    Therapy/Group: Individual Therapy  Veroncia Jezek 01/04/2021, 9:12 AM

## 2021-01-04 NOTE — Progress Notes (Signed)
Occupational Therapy Session Note  Patient Details  Name: Terry Mitchell MRN: [id number] Date of Birth: [birth year]  Today's Date: 01/04/2021 OT Individual Time: 1561-5379 OT Individual Time Calculation (min): 28 min    Short Term Goals: Week 3:  OT Short Term Goal 1 (Week 3): STG=LTG secondary to ELOS  Skilled Therapeutic Interventions/Progress Updates:    Pt completed stand pivot transfer to the therapy mat with mod assist and no device.  Once on the mat had him focus on sit to stand transitions and standing balance.  Min assist for standing in order to reach down and pick up playing cards with the RUE and placing them on the mirror.  Emphasis on bilateral knee flexion and balance.  He was able to complete at min assist level with standing 4-5 mins before resting.  Transitioned to pre-gait task of standing and taking small steps forward with the RLE and then the LLE with min to mod assist for balance.  When stepping back, pt would at times take too big of a step causing him to lose his balance.  He attempted to correct, but would compensate too much with decreased ability to smoothly complete stepping strategy secondary to dysmetria, resulting in a more significant LOB.  Finished session with squat pivot transfer to the wheelchair at min assist and pushing himself back to the room.  Call button and phone in reach with safety alarm pad in place.    Therapy Documentation Precautions:  Precautions Precautions: Fall Precaution Comments: ataxic LEs, fall risk Restrictions Weight Bearing Restrictions: No    Pain: Pain Assessment Pain Scale: Faces Pain Score: 0-No pain ADL: See Care Tool Section for some details of mobilty and selfcare  Therapy/Group: Individual Therapy  Kassandra Meriweather OTR/L 01/04/2021, 3:57 PM

## 2021-01-04 NOTE — Progress Notes (Signed)
Physical Therapy Session Note  Patient Details  Name: Terry Mitchell MRN: [id number] Date of Birth: [birth year]  Today's Date: 01/04/2021 PT Individual Time: 1115-1200 PT Individual Time Calculation (min): 45 min   Short Term Goals: Week 1:  PT Short Term Goal 1 (Week 1): Pt will perform least restrictive transfer with min A consistently PT Short Term Goal 1 - Progress (Week 1): Met PT Short Term Goal 2 (Week 1): Pt will initiate gait training outside of // bars with LRAD PT Short Term Goal 2 - Progress (Week 1): Met PT Short Term Goal 3 (Week 1): Pt will initiate stair training as safe and able PT Short Term Goal 3 - Progress (Week 1): Met Week 2:  PT Short Term Goal 1 (Week 2): Pt will ambulate x 100 ft with LRAD and min A PT Short Term Goal 1 - Progress (Week 2): Met PT Short Term Goal 2 (Week 2): Pt will perform least restrictive transfer with Supervision consistently PT Short Term Goal 2 - Progress (Week 2): Progressing toward goal PT Short Term Goal 3 (Week 2): Pt will navigate 6" stairs with one handrail and min A PT Short Term Goal 3 - Progress (Week 2): Met  Skilled Therapeutic Interventions/Progress Updates:    Pt received in sitting in wheelchair and agreeable to therapy. Pt propelled w/c x300 ft and was able to manage wheelchair parts independently. Step taps on 6" step with UE support x 20 to facilitate reciprocal motion and  proprioception. Pt  ambulated w/ RW and min A to mat table, x 100 ft. Pt directed in placing foot on compliant surface x 20 with RW. Side steps with RW with small obstacle to facilitate improved foot clearance and proprioception. Pt returned to room with RW and min A, x 250 ft and was left seated in w/c with chair alarm active.  Therapy Documentation Precautions:  Precautions Precautions: Fall Precaution Comments: ataxic LEs, fall risk Restrictions Weight Bearing Restrictions: No     Therapy/Group: Individual Therapy  Mickel Fuchs, PT,  DPT Excell Seltzer, PT, DPT, CSRS  01/04/2021, 12:10 PM

## 2021-01-04 NOTE — Progress Notes (Signed)
Physical Therapy Session Note  Patient Details  Name: Terry Mitchell MRN: [id number] Date of Birth: 01/05/1951  Today's Date: 01/04/2021 PT Individual Time: 1530-1630 PT Individual Time Calculation (min): 60 min   Short Term Goals: Week 3:  PT Short Term Goal 1 (Week 3): =LTG due to ELOS   Skilled Therapeutic Interventions/Progress Updates:  Pt received seated in w/c in room, agreeable to PT session. No complaints of pain. Manual w/c propulsion to/from therapy gym at mod I level. Pt is independent for management of w/c parts. Sit to stand and transfers with RW and CGA throughout session. Ambulation through obstacle course navigating through cones, balancing on airex x 30 sec with no UE support, and stepping over hockey sticks with RW and min A for balance. Static standing balance with no UE support and min A performing rebounder ball toss, 2 x 30 reps. Pt has one instance of LOB, able to recover with min A. Ambulation x 180 ft with RW and CGA for balance, cues for B heel strike during gait. Pt left seated in w/c in room with needs in reach, chair alarm in place at end of session.  Therapy Documentation Precautions:  Precautions Precautions: Fall Precaution Comments: ataxic LEs, fall risk Restrictions Weight Bearing Restrictions: No     Therapy/Group: Individual Therapy   Excell Seltzer, PT, DPT, CSRS  01/04/2021, 5:13 PM

## 2021-01-04 NOTE — Progress Notes (Signed)
Patient educated on self administration of Lovenox injection and he was able to demonstrate it.

## 2021-01-05 LAB — CBC WITH DIFFERENTIAL/PLATELET
Abs Immature Granulocytes: 0.03 10*3/uL (ref 0.00–0.07)
Basophils Absolute: 0 10*3/uL (ref 0.0–0.1)
Basophils Relative: 0 %
Eosinophils Absolute: 0 10*3/uL (ref 0.0–0.5)
Eosinophils Relative: 0 %
HCT: 34 % — ABNORMAL LOW (ref 39.0–52.0)
Hemoglobin: 11 g/dL — ABNORMAL LOW (ref 13.0–17.0)
Immature Granulocytes: 1 %
Lymphocytes Relative: 5 %
Lymphs Abs: 0.3 10*3/uL — ABNORMAL LOW (ref 0.7–4.0)
MCH: 33.5 pg (ref 26.0–34.0)
MCHC: 32.4 g/dL (ref 30.0–36.0)
MCV: 103.7 fL — ABNORMAL HIGH (ref 80.0–100.0)
Monocytes Absolute: 0.5 10*3/uL (ref 0.1–1.0)
Monocytes Relative: 10 %
Neutro Abs: 4.6 10*3/uL (ref 1.7–7.7)
Neutrophils Relative %: 84 %
Platelets: 91 10*3/uL — ABNORMAL LOW (ref 150–400)
RBC: 3.28 MIL/uL — ABNORMAL LOW (ref 4.22–5.81)
RDW: 13.6 % (ref 11.5–15.5)
WBC: 5.4 10*3/uL (ref 4.0–10.5)
nRBC: 0 % (ref 0.0–0.2)

## 2021-01-05 LAB — COMPREHENSIVE METABOLIC PANEL
ALT: 40 U/L (ref 0–44)
AST: 20 U/L (ref 15–41)
Albumin: 2.7 g/dL — ABNORMAL LOW (ref 3.5–5.0)
Alkaline Phosphatase: 98 U/L (ref 38–126)
Anion gap: 5 (ref 5–15)
BUN: 30 mg/dL — ABNORMAL HIGH (ref 8–23)
CO2: 29 mmol/L (ref 22–32)
Calcium: 8.5 mg/dL — ABNORMAL LOW (ref 8.9–10.3)
Chloride: 101 mmol/L (ref 98–111)
Creatinine, Ser: 1.34 mg/dL — ABNORMAL HIGH (ref 0.61–1.24)
GFR, Estimated: 58 mL/min — ABNORMAL LOW (ref >60)
Glucose, Bld: 104 mg/dL — ABNORMAL HIGH (ref 70–99)
Potassium: 4.9 mmol/L (ref 3.5–5.1)
Sodium: 135 mmol/L (ref 135–145)
Total Bilirubin: 0.7 mg/dL (ref 0.3–1.2)
Total Protein: 5 g/dL — ABNORMAL LOW (ref 6.5–8.1)

## 2021-01-05 MED ORDER — SODIUM CHLORIDE 0.9 % IV SOLN: INTRAVENOUS | Status: AC

## 2021-01-05 MED ORDER — AMLODIPINE BESYLATE 5 MG PO TABS
7.5000 mg | ORAL_TABLET | Freq: Every day | ORAL | Status: DC
  Administered 2021-01-06 – 2021-01-12 (×7): 7.5 mg via ORAL
  Filled 2021-01-05 (×7): qty 1

## 2021-01-05 NOTE — Progress Notes (Signed)
Occupational Therapy Session Note  Patient Details  Name: Terry Mitchell MRN: [id number] Date of Birth: Jun 23, 1951  Today's Date: 01/05/2021 OT Individual Time: 9324-1991 OT Individual Time Calculation (min): 30 min    Short Term Goals: Week 1:  OT Short Term Goal 1 (Week 1): Pt will complete toilet/BSC transfers with LRAD with Mod A consistently OT Short Term Goal 1 - Progress (Week 1): Met OT Short Term Goal 2 (Week 1): Pt will complete sit <> stands with Min A at LRAD in prep for ADL OT Short Term Goal 2 - Progress (Week 1): Met OT Short Term Goal 3 (Week 1): Pt will stand for 1-3 mins with unilateral support to improve LB ADL OT Short Term Goal 3 - Progress (Week 1): Met OT Short Term Goal 4 (Week 1): Pt will complete LB dressing w/ no more than Min A OT Short Term Goal 4 - Progress (Week 1): Met Week 2:  OT Short Term Goal 1 (Week 2): Pt will complete toileting tasks with min A OT Short Term Goal 1 - Progress (Week 2): Met OT Short Term Goal 2 (Week 2): Pt will perform toilet tranfsers with supervision OT Short Term Goal 2 - Progress (Week 2): Met OT Short Term Goal 3 (Week 2): Pt will pull pants over hips standing with CGA OT Short Term Goal 3 - Progress (Week 2): Met Week 3:  OT Short Term Goal 1 (Week 3): STG=LTG secondary to ELOS  Skilled Therapeutic Interventions/Progress Updates:    Pt greeted at time of session sitting up in wheelchair agreeable to OT session. Self propel room <> ADL apartment Supervision. Focus of session on IADL retraining for item retrieval from fridge and cabinets both seated and standing in prep for home. Pt able to perform item retrieval close supervision/CGA and good recall of safety cues. Back in room alarm on call bell in reach.   Therapy Documentation Precautions:  Precautions Precautions: Fall Precaution Comments: ataxic LEs, fall risk Restrictions Weight Bearing Restrictions: No     Therapy/Group: Individual Therapy  Viona Gilmore 01/05/2021, 7:23 AM

## 2021-01-05 NOTE — Progress Notes (Signed)
Occupational Therapy Session Note  Patient Details  Name: Terry Mitchell MRN: [id number] Date of Birth: 1950-12-18  Today's Date: 01/05/2021 OT Individual Time: 8867-7373 OT Individual Time Calculation (min): 72 min    Short Term Goals: Week 3:  OT Short Term Goal 1 (Week 3): STG=LTG secondary to ELOS  Skilled Therapeutic Interventions/Progress Updates:    Pt resting in bed upon arrival. OT intervention with focus on self care tasks, toileting, BLE therex, and squats from EOM to increase independence with BADLs. Pt requested to use toilet and tranfserred to w/c. Stand pivot transfer to toilet with use of grab bars with supervision. Toileting with supervision. Pt completed bathing and dressing at w/c level at sink. Sit<>stand with supervisoin. Pt propelled w/c to day room for BLE therex on NuStep 7 mins level 5 BLE only. Pt transitioned to EOM and performed squats from EOB 3x8 with hands on knees and 3x8 with UE crossed on chest. Pt pleased with progress. Pt returned to room and remained in w/c with seat alarm activated. All needs within reach.   Therapy Documentation Precautions:  Precautions Precautions: Fall Precaution Comments: ataxic LEs, fall risk Restrictions Weight Bearing Restrictions: No  Pain: Pain Assessment Pain Scale: 0-10 Pain Score: 6  Pain Location: Ankle Pain Orientation: Left Pain Descriptors / Indicators: Aching Pain Frequency: Rarely Pain Onset: Gradual Pain Intervention(s): RN notified and Ted hose donned   Therapy/Group: Individual Therapy  Leroy Libman 01/05/2021, 8:14 AM

## 2021-01-05 NOTE — Progress Notes (Signed)
Physical Therapy Session Note  Patient Details  Name: Terry Mitchell MRN: [id number] Date of Birth: Nov 04, 1950  Today's Date: 01/05/2021 PT Individual Time: 1305-1400 PT Individual Time Calculation (min): 55 min   Short Term Goals: Week 1:  PT Short Term Goal 1 (Week 1): Pt will perform least restrictive transfer with min A consistently PT Short Term Goal 1 - Progress (Week 1): Met PT Short Term Goal 2 (Week 1): Pt will initiate gait training outside of // bars with LRAD PT Short Term Goal 2 - Progress (Week 1): Met PT Short Term Goal 3 (Week 1): Pt will initiate stair training as safe and able PT Short Term Goal 3 - Progress (Week 1): Met Week 2:  PT Short Term Goal 1 (Week 2): Pt will ambulate x 100 ft with LRAD and min A PT Short Term Goal 1 - Progress (Week 2): Met PT Short Term Goal 2 (Week 2): Pt will perform least restrictive transfer with Supervision consistently PT Short Term Goal 2 - Progress (Week 2): Progressing toward goal PT Short Term Goal 3 (Week 2): Pt will navigate 6" stairs with one handrail and min A PT Short Term Goal 3 - Progress (Week 2): Met Week 3:  PT Short Term Goal 1 (Week 3): =LTG due to ELOS  Skilled Therapeutic Interventions/Progress Updates:   Pain:  Pt reports no pain.  Treatment to tolerance.  Rest breaks and repositioning as needed.  Pt initially oob in wc and agreeable to treatment session w/focus on NMRE, functinal strengthening. Wc propulsion x 1253f mod I. Gait 982fw/RW, cga, cues for foot placement, maintains some knee flexion bilat, tends to scissor somewhat w/L.  Sidestepping in parallel bars [id number] 4 using therapist foot as target to guide foot placement, mirror, cues for posture.  Standing w/bilat UE support, worked on stepping forward/back to targets w/emphasis on placement + maintaining stable stance limb.  Added stepping forward/back/across/back while maintaining stable limb/to target for prioprioceptive training.  Standing on airex  no UE support x 30 sec w/mild sway Added cervical nods - moderate sway, cga to min assist When therapist stood behind pt vs in front pt w/sign increase and forward tendency, min assist to maintain static balance. 3-4 min x 2, pt fatigued w/this activity.  Gait 100f26f, 60ft64f w/RW as above.   Pt left oob in wc w/alarm belt set and needs in reach    Therapy Documentation Precautions:  Precautions Precautions: Fall Precaution Comments: ataxic LEs, fall risk Restrictions Weight Bearing Restrictions: No G  Therapy/Group: Individual Therapy  BarbaJerrilyn [place]/2022, 4:15 PM

## 2021-01-05 NOTE — Progress Notes (Signed)
PROGRESS NOTE   Subjective/Complaints:  Pt reports has been drinking more- but Cr 1.34- only very slightly better and BUN still 30-  Also c/o new L>R LE swelling to ankles- explained it's likely due ot Norvasc increase- will decrease to 7.5 mg daily as of tomorrow.  Wearing TEDs now.  Had pain in L foot around 4am- would rather use prn pain meds- not restart MS Contin.   Will start IVFs today at 3pm to avoid therapy and do until AM 8am.  And recheck labs in AM  ROS:  Pt denies SOB, abd pain, CP, N/V/C/D, and vision changes    Objective:   No results found. Recent Labs    01/05/21 0507  WBC 5.4  HGB 11.0*  HCT 34.0*  PLT 91*   Recent Labs    01/05/21 0507  NA 135  K 4.9  CL 101  CO2 29  GLUCOSE 104*  BUN 30*  CREATININE 1.34*  CALCIUM 8.5*    Intake/Output Summary (Last 24 hours) at 01/05/2021 1247 Last data filed at 01/05/2021 1000 Gross per 24 hour  Intake 574 ml  Output 1875 ml  Net -1301 ml        Physical Exam: Vital Signs Blood pressure 132/84, pulse (!) 58, temperature 98.6 F (37 C), temperature source Oral, resp. rate 17, weight 89.8 kg, SpO2 100 %.     General: awake, alert, appropriate,sitting up in w/c at sink- washing face;  NAD HENT: conjugate gaze; oropharynx moist CV: regular rate- borderline bradycardia; no JVD Pulmonary: CTA B/L; no W/R/R- good air movement GI: soft, NT, ND, (+)BS Psychiatric: appropriate- more interactive today- smiling some Neurological: Ox3 Sensory level still T7 and down- rechecked today RLE_ DF and PF 4/5 LLE- DF 3+/5 and PF 4/5- no significant change Musculoskeletal:        General: No swelling.     Cervical back: Normal range of motion and neck supple.     Comments: Pt's strength in UEs 5/5 B/L in biceps, triceps, WE, grip and finger abd B/L LEs: RLE- HF 4-/5, KE 4-/5, DF 4+/5, PF 4-/5 LLE: HF 3/5, KE 4-/5, DF 3+/5, and PF 4-/5 - LLE very  slightly better- but not quite up on points.  Skin:has some new bruising the top/base of 2nd-5th toes on R foot- brownish purple- no trauma - intact pulse L>R LE edema to ankles 1-2+    General: Skin is warm and dry. Skin looks great except has radiation mark as well as fungal toenails/very thickened   Neurological:     Mental Status: He is alert and oriented to person, place, and time.     Sensory: Sensory deficit present.     Motor: Weakness present.     Comments: Speech clear. Sensory deficits from waist down with paraplegia.  Sensation decreased a lot from T7 on down to S5 B/L- no change on exam today- still decreased.     Assessment/Plan: 1. Functional deficits which require 3+ hours per day of interdisciplinary therapy in a comprehensive inpatient rehab setting. Physiatrist is providing close team supervision and 24 hour management of active medical problems listed below. Physiatrist and rehab team continue to assess barriers to  discharge/monitor patient progress toward functional and medical goals  Care Tool:  Bathing    Body parts bathed by patient: Right arm, Left arm, Chest, Abdomen, Front perineal area, Buttocks, Right upper leg, Left upper leg, Right lower leg, Left lower leg, Face         Bathing assist Assist Level: Set up assist     Upper Body Dressing/Undressing Upper body dressing   What is the patient wearing?: Pull over shirt    Upper body assist Assist Level: Independent    Lower Body Dressing/Undressing Lower body dressing      What is the patient wearing?: Underwear/pull up, Pants     Lower body assist Assist for lower body dressing: Supervision/Verbal cueing     Toileting Toileting    Toileting assist Assist for toileting: Supervision/Verbal cueing     Transfers Chair/bed transfer  Transfers assist     Chair/bed transfer assist level: Contact Guard/Touching assist     Locomotion Ambulation   Ambulation assist      Assist  level: Minimal Assistance - Patient > 75% Assistive device: Walker-rolling Max distance: 250 ft   Walk 10 feet activity   Assist     Assist level: Minimal Assistance - Patient > 75% Assistive device: Walker-rolling   Walk 50 feet activity   Assist Walk 50 feet with 2 turns activity did not occur: Safety/medical concerns  Assist level: Minimal Assistance - Patient > 75% Assistive device: Walker-rolling    Walk 150 feet activity   Assist Walk 150 feet activity did not occur: Safety/medical concerns  Assist level: Minimal Assistance - Patient > 75% Assistive device: Walker-rolling    Walk 10 feet on uneven surface  activity   Assist Walk 10 feet on uneven surfaces activity did not occur: Safety/medical concerns         Wheelchair     Assist Will patient use wheelchair at discharge?: Yes Type of Wheelchair: Manual    Wheelchair assist level: Independent Max wheelchair distance: 150'    Wheelchair 50 feet with 2 turns activity    Assist        Assist Level: Independent   Wheelchair 150 feet activity     Assist      Assist Level: Independent   Blood pressure 132/84, pulse (!) 58, temperature 98.6 F (37 C), temperature source Oral, resp. rate 17, weight 89.8 kg, SpO2 100 %.  Medical Problem List and Plan: 1.  T6 paraplegia ASIA D secondary to mets to Spinal cord/tumors compressing [place]/prostate CA             -patient may  shower             -ELOS/Goals:10-14 days- goals supervision  Continue PT and OT- Radiation to actually finish Monday- they changed it.    con't PT and OT- asking if will walk "again"- explained we cannot tell, but he's walking some with RW- which is better than expected.   Continue CIR- PT, OT and SLP 2.  Antithrombotics: -DVT/anticoagulation:  Pharmaceutical: Lovenox  -will need for at least 2 months due to [place], no matter how far he's walking 6/15- will teach pt/family about Lovenox starting tomorrow- will  place order.              -antiplatelet therapy: N/a 3. Pain Management: MS contin bid with oxycodone prn. Continue and change as required  6/6- not taking prns- just scheduled pain meds- con't regimen  6/8- pain 4/10- controlled per pt- con't regimen- not  taking prns.   6/9- pt doesn't want to change regimen- con't medspain   6/10- pt reports for last 24 hours, pain 0/10- no pain- if continues, will wean long acting pain meds. 6/11- will stop AM MS Contin as of tomorrow AM- and monitor- has prns if needed   6/14- will stop QHS Ms Contin- if has pain, has prns- if has electrical sensations again, might need nerve pain meds.   6/1hasn't had more electrical pain, just aching/throbbing- con't prn pain meds  6/16- will con't prns- pt doesn't want to add back MS Cotnin, which I think is good- con't prns.  4. Mood: LCSW to follow for evaluation and support.              -antipsychotic agents: N/a 5. Neuropsych: This patient is capable of making decisions on his own behalf. 6. Skin/Wound Care: Routine pressure relief measures.  7. Fluids/Electrolytes/Nutrition: Monitor I/O. Check lytes in am.  8. HTN: Monitor BP tid--continue Norvasc.              --Resume Cozaar if BP continues to be labile    6/1- BP 139/79 this AM- overall borderline high- will wait to restart Cozaar- Of note, HR is 57 this AM  6/2- will restart Cozaar- 25 mg daily- will restart since BP is still elevated in 150s.   6/7- BP well controlled- con't regimen  6/8- BP 150s/90s this AM- usually controlled- will monitor for trend. 6/9- also elevated today- if isn't down by tomorrow, will increase Cozaar. 6/11- BP controlled 130s/80s- con't regimen Vitals:   01/04/21 1947 01/05/21 0427  BP: 129/85 132/84  Pulse: 72 (!) 58  Resp: 18 17  Temp: 98 F (36.7 C) 98.6 F (37 C)  SpO2: 100% 100%  6/14- BP a little elevated- but Cozaar also could be causing Cr issue- will increase Norvasc to 10 mg daily and stop Cozaar.  And recheck labs  Thursday- if BUN still going up, will need IVFs.  6/16- BP 120s-130s/80s- will decrease Norvasc to 7.5 mg daily due to LE edema- which is new.  9. Metastatic prostate cancer to spine: XRT ongoing             --slow steroid taper--4 mg tid X 3 days followed by bid X 7 days-->daily X 7 days, then 2 mg daily X 7d days.   10. At risk for spasticity and neurogenic bowel and bladder- will scan bladder to make sure emptying and monitor bowels.    6/1- Pt reports small BM overnight- con't to monitor for urinary retention- as well as con't bowel meds for constipation.   6/3- LBM overnight- con't meds  6/4-6/5: continue laxatives  6/6- refusing laxatives, but still bleeding hemorrhoids con't to encourage usage.   6/7- LBM early this AM- con't regimen  6/11- refusing laxatives- con't regimen 11. L foot drop  6/1- will make sure has PRAFO ordered for L and wear at night.  12. Hyperkalemia  6/2- will give dose of Lokelma 10G x1- and will recheck in AM  6/3- K+ down to 5.0- will recheck Monday- might need Lokelma more frequently.   6/6- K+ 4.6 today-con't to monitor  6/9- K+ 4.7 today- will recheck Monday.   6/11- will recheck labs in AM 6/14- K+ 4.9- will recheck Thursday  6/16- still K+ 4.9-  13. Hemorrhoids  6/3- will order Tucks and Anusol suppository since bleeding with BMs.     6/6- refusing miralax as well as anusol suppository even though he's still  bleeding. 14. Depressed mood/flat  6/8- will start Celexa 10 mg daily and monitor for side effects.   6/9- pt refusing Celexa- will stop.  15. Azotemia  6/9- pt's Cr up to 1.40 from 1.18- will push fluids- and recheck Monday.   6/10- encouraged pt to drink 6-8 cups /day -admits was drinking 2-3 cups/day- will recheck Monday.   6/15- will recheck Monday since Cr edging upwards  6/16- will start IVFs 75cc hour for 18 hours- so misses therapy and recheck BMP in AM.  16. Bruising of R foot  6/11- will check platelet level- was last clumping- be  that was low- could be cause- will check in AM  6/14- Plts 90k this AM- was 114k yesterday- will recheck Thursday.   6/16- plts 91k- likely why bruising more- con't regimen 17.  Cough - non productive, no SOB, afebrile, WBC normal , will tx symptomatically at this point , Recent CT chest owed mild centrilobular emphysema no other lung abnormalities   LOS: 16 days A FACE TO FACE EVALUATION WAS PERFORMED  Yale Golla 01/05/2021, 12:47 PM

## 2021-01-05 NOTE — Progress Notes (Signed)
Occupational Therapy Session Note  Patient Details  Name: Ifeoluwa Beller MRN: [id number] Date of Birth: [birth year]  Today's Date: 01/05/2021 OT Individual Time: 1400-1430 OT Individual Time Calculation (min): 30 min    Short Term Goals: Week 3:  OT Short Term Goal 1 (Week 3): STG=LTG secondary to ELOS  Skilled Therapeutic Interventions/Progress Updates:    OT intervention with focus on dynamic standing balance at rebounder. Pt tossed soccer ball against rebounder while standing without UE support 4x10. Pt repeated the same task with 1kg ball. All completed with CGA. 1 LOB but pt able to self correct. Pt returned to room and remained seated in w/c with seat alarm activated. All needs within reach.   Therapy Documentation Precautions:  Precautions Precautions: Fall Precaution Comments: ataxic LEs, fall risk Restrictions Weight Bearing Restrictions: No Pain:  Pt states his LLE/ankle is feeling "much better" this afternoon   Therapy/Group: Individual Therapy  Leroy Libman 01/05/2021, 2:35 PM

## 2021-01-06 LAB — BASIC METABOLIC PANEL
Anion gap: 5 (ref 5–15)
BUN: 29 mg/dL — ABNORMAL HIGH (ref 8–23)
CO2: 29 mmol/L (ref 22–32)
Calcium: 8.7 mg/dL — ABNORMAL LOW (ref 8.9–10.3)
Chloride: 102 mmol/L (ref 98–111)
Creatinine, Ser: 1.14 mg/dL (ref 0.61–1.24)
GFR, Estimated: >60 mL/min (ref >60)
Glucose, Bld: 100 mg/dL — ABNORMAL HIGH (ref 70–99)
Potassium: 4.8 mmol/L (ref 3.5–5.1)
Sodium: 136 mmol/L (ref 135–145)

## 2021-01-06 NOTE — Progress Notes (Signed)
Occupational Therapy Session Note  Patient Details  Name: Terry Mitchell MRN: [id number] Date of Birth: [birth year]  Today's Date: 01/06/2021 OT Individual Time: 1300-1400 OT Individual Time Calculation (min): 60 min    Short Term Goals: Week 3:  OT Short Term Goal 1 (Week 3): STG=LTG secondary to ELOS  Skilled Therapeutic Interventions/Progress Updates:    Pt received in room in w/c and consented to OT tx. Pt seen for standing 24 piece jumbo jigsaw puzzle activity to increase standing tolerance and balance for ADLs. Pt required cuing for upright posture and to extend knees when standing, relied heavily on elevated table top support when standing with close SUP. Pt required 2 seated rest breaks due to fatigue. After pt completed puzzle, instructed in BUE strengthening HEP to increase strength and activity tolerance for ADLs. Pt instructed in 2#db unilateral exercises including shoulder flexion, 3#db exercises including chest press and elbow flexion all for 3x15 with min cuing for proper tech with good carryover. Pt req rest breaks in between sets 2/2 fatigue. After tx, pt helped back to room and left up in w/c with chair pad alarm on and all needs met.   Therapy Documentation Precautions:  Precautions Precautions: Fall Precaution Comments: ataxic LEs, fall risk Restrictions Weight Bearing Restrictions: No    Pain: none      Therapy/Group: Individual Therapy  Thetis Schwimmer 01/06/2021, 1:18 PM

## 2021-01-06 NOTE — Progress Notes (Signed)
Patient ID: Terry Mitchell, male   DOB: 1950-11-13, 70 y.o.   MRN: [id number]  SW met with pt in room to review d/c recs RW and OPPT/OT. Pt would like RW to be ordered. SW discussed family education. Pt called his dtr [name] while in the room. Family edu scheduled for Monday (6/20) 9am-12pm. When discussing d/c plan, reports they are working on a one level home that has no steps to enter. She states if they are unable to find a location, he iwll go to her home in [place]. SW will wait to set up outpatient therapies until sure on pt d/c location.   SW ordered RW with [employer] [facility].   Loralee Pacas, MSW, [place] Office: [phone] Cell: [phone] Fax: [phone]

## 2021-01-06 NOTE — Progress Notes (Signed)
Physical Therapy Session Note  Patient Details  Name: Terry Mitchell MRN: [id number] Date of Birth: 1950-08-29  Today's Date: 01/06/2021 PT Individual Time:  0901-0959  PT Individual Time Calculation (min): 58 min    Short Term Goals: Week 3:  PT Short Term Goal 1 (Week 3): =LTG due to ELOS  Skilled Therapeutic Interventions/Progress Updates:  Patient seated upright in w/c on entrance to room. Patient alert and agreeable to PT session. Patient denied pain during session.  Wheelchair Mobility:  Patient propelled wheelchair 150 feet with supervision including 90 degree turns and angling back and forth to position at mat table for squat pivot transfer. Pt also able to manage leg rests with supervision.  Therapeutic Activity: Bed Mobility: At end of session, pt requests to return to bed to rest before afternoon therapy session. Squat pivot w/c--> bed with supervision using bedrail for support. Sitting EOB to supine performed with supervision.   Transfers: Patient performed squat pivot transfer to/ from w/c with close supervision in front of Bil knees. STS and SPVT transfers using RW performed with close supervision/ CGA. Provided verbal cues for foot positioning.  Pt guided in continuous reciprocation of BUE and BLE using NuStep L4 x 68min/ L3x 11min with focus on maintaining speed at least 50 steps/ min. At 57min total mark, pt instructed to reduce to 45 steps/ min and maintain for 17min, then to 35 steps/ min for 18min in order to provide cool down at end of session.  Gait Training:  Patient ambulated 170 feet including one 180 degree turn at halfway mark using RW with CGA. Demonstrated good placement of feet with hip width placement of steps for majority of distance. Provided vc/ tc for maintaining strong knee extension during stance phase, maintaining good foot placement for hip width BOS. Monitored for fatigue/ weakness throughout.   Neuromuscular Re-ed: NMR facilitated during session with  focus on standing static and dynamic balance/ coordination. Pt guided in standing on Airex progressing to no UE support and then to holding 2# weighted ball with bil hands and moving ball out and back to chest x10 with no LOB.  Then standing statically on Airex and reaching to match beanbags to matching colored discs placed on tray table outside of BOS in front of pt.  Progressed to Dynamic stepping to reach bean bags and place to matching colored dot on tray table. Changing step from R <> L x6 with UUE support. All with CGA and block to R knee but no buckling noted.  NMR performed for improvements in motor control and coordination, balance, sequencing, judgement, and self confidence/ efficacy in performing all aspects of mobility at highest level of independence.   Patient supine in bed at end of session with brakes locked, bed alarm set, and all needs within reach.     Therapy Documentation Precautions:  Precautions Precautions: Fall Precaution Comments: ataxic LEs, fall risk Restrictions Weight Bearing Restrictions: No  Therapy/Group: Individual Therapy  Alger Simons PT, DPT 01/06/2021, 9:41 AM

## 2021-01-06 NOTE — Progress Notes (Signed)
Occupational Therapy Session Note  Patient Details  Name: Terry Mitchell MRN: [id number] Date of Birth: 1950-10-10  Today's Date: 01/06/2021 OT Individual Time: 0700-0809 OT Individual Time Calculation (min): 69 min    Short Term Goals: Week 3:  OT Short Term Goal 1 (Week 3): STG=LTG secondary to ELOS  Skilled Therapeutic Interventions/Progress Updates:    Pt resting in w/c upon arrival. Pt requested to use toilet. Transfers with grab bars and toileting with supervision. Pt donned Ted hose using "plastic bag technique." Pt propelled w/c to day room. NuStep 2x5 min level 5 BLE only. Pt amb with RW to EOM and engaged in corn hole standing with no UE support (CGA). Pt stood with no UE support to clean bean bags. Squats with hands on Bil knees 3x8 and with UE crossed on chest 2x5. Pt propelled w/c back to room and remained seated in w/c. Seat alarm activated. All needs within reach.   Therapy Documentation Precautions:  Precautions Precautions: Fall Precaution Comments: ataxic LEs, fall risk Restrictions Weight Bearing Restrictions: No   Pain:  Pt reports his Lt foot is "feeling better" this monrning   Therapy/Group: Individual Therapy  Leroy Libman 01/06/2021, 8:10 AM

## 2021-01-07 NOTE — Progress Notes (Signed)
PROGRESS NOTE   Subjective/Complaints:  No pains , feels ok today , getting a little stronger in the legs, ssensation feels about the same and is reduced in LEs  ROS:  Pt denies SOB, abd pain, CP, N/V/C/D, and vision changes    Objective:   No results found. Recent Labs    01/05/21 0507  WBC 5.4  HGB 11.0*  HCT 34.0*  PLT 91*    Recent Labs    01/05/21 0507 01/06/21 0513  NA 135 136  K 4.9 4.8  CL 101 102  CO2 29 29  GLUCOSE 104* 100*  BUN 30* 29*  CREATININE 1.34* 1.14  CALCIUM 8.5* 8.7*     Intake/Output Summary (Last 24 hours) at 01/07/2021 1222 Last data filed at 01/07/2021 0210 Gross per 24 hour  Intake 200 ml  Output 950 ml  Net -750 ml         Physical Exam: Vital Signs Blood pressure (!) 145/89, pulse 67, temperature 98.1 F (36.7 C), temperature source Oral, resp. rate 16, weight 91.2 kg, SpO2 100 %.  General: No acute distress Mood and affect are appropriate Heart: Regular rate and rhythm no rubs murmurs or extra sounds Lungs: Clear to auscultation, breathing unlabored, no rales or wheezes Abdomen: Positive bowel sounds, soft nontender to palpation, nondistended Extremities: No clubbing, cyanosis, or edema Skin: No evidence of breakdown, no evidence of rash   Musculoskeletal:        General: No swelling.     Cervical back: Normal range of motion and neck supple.     Comments: Pt's strength in UEs 5/5 B/L in biceps, triceps, WE, grip and finger abd B/L LEs: RLE- HF 4-/5, KE 4-/5, DF 4+/5, PF 4-/5 LLE: HF 3/5, KE 4-/5, DF 3+/5, and PF 4-/5 - LLE very slightly better- but not quite up on points.  Skin:has some new bruising the top/base of 2nd-5th toes on R foot- brownish purple- no trauma - intact pulse L>R LE edema to ankles 1-2+    General: Skin is warm and dry. Skin looks great except has radiation mark as well as fungal toenails/very thickened   Neurological:     Mental  Status: He is alert and oriented to person, place, and time.     Sensory: Sensory deficit present.     Motor: Weakness present.     Comments: Speech clear. Sensory deficits from waist down with paraplegia.  Sensation decreased a lot from T7 on down to S5 B/L still decreased.     Assessment/Plan: 1. Functional deficits which require 3+ hours per day of interdisciplinary therapy in a comprehensive inpatient rehab setting. Physiatrist is providing close team supervision and 24 hour management of active medical problems listed below. Physiatrist and rehab team continue to assess barriers to discharge/monitor patient progress toward functional and medical goals  Care Tool:  Bathing    Body parts bathed by patient: Right arm, Left arm, Chest, Abdomen, Front perineal area, Buttocks, Right upper leg, Left upper leg, Right lower leg, Left lower leg, Face         Bathing assist Assist Level: Set up assist     Upper Body Dressing/Undressing Upper body dressing  What is the patient wearing?: Pull over shirt    Upper body assist Assist Level: Independent    Lower Body Dressing/Undressing Lower body dressing      What is the patient wearing?: Underwear/pull up, Pants     Lower body assist Assist for lower body dressing: Supervision/Verbal cueing     Toileting Toileting    Toileting assist Assist for toileting: Supervision/Verbal cueing     Transfers Chair/bed transfer  Transfers assist     Chair/bed transfer assist level: Contact Guard/Touching assist     Locomotion Ambulation   Ambulation assist      Assist level: Minimal Assistance - Patient > 75% Assistive device: Walker-rolling Max distance: 250 ft   Walk 10 feet activity   Assist     Assist level: Minimal Assistance - Patient > 75% Assistive device: Walker-rolling   Walk 50 feet activity   Assist Walk 50 feet with 2 turns activity did not occur: Safety/medical concerns  Assist level: Minimal  Assistance - Patient > 75% Assistive device: Walker-rolling    Walk 150 feet activity   Assist Walk 150 feet activity did not occur: Safety/medical concerns  Assist level: Minimal Assistance - Patient > 75% Assistive device: Walker-rolling    Walk 10 feet on uneven surface  activity   Assist Walk 10 feet on uneven surfaces activity did not occur: Safety/medical concerns         Wheelchair     Assist Will patient use wheelchair at discharge?: Yes Type of Wheelchair: Manual    Wheelchair assist level: Independent Max wheelchair distance: 150'    Wheelchair 50 feet with 2 turns activity    Assist        Assist Level: Independent   Wheelchair 150 feet activity     Assist      Assist Level: Independent   Blood pressure (!) 145/89, pulse 67, temperature 98.1 F (36.7 C), temperature source Oral, resp. rate 16, weight 91.2 kg, SpO2 100 %.  Medical Problem List and Plan: 1.  T6 paraplegia ASIA D secondary to mets to Spinal cord/tumors compressing [place]/prostate CA             -patient may  shower             -ELOS/Goals:10-14 days- goals supervision  Continue PT and OT- Radiation to actually finish Monday- they changed it.    con't PT and OT- asking if will walk "again"- explained we cannot tell, but he's walking some with RW- which is better than expected.   Continue CIR- PT, OT and SLP 2.  Antithrombotics: -DVT/anticoagulation:  Pharmaceutical: Lovenox  -will need for at least 2 months due to [place], no matter how far he's walking 6/15- will teach pt/family about Lovenox starting tomorrow- will place order.              -antiplatelet therapy: N/a 3. Pain Management: MS contin bid with oxycodone prn. Continue and change as required  6/6- not taking prns- just scheduled pain meds- con't regimen  6/8- pain 4/10- controlled per pt- con't regimen- not taking prns.   6/9- pt doesn't want to change regimen- con't medspain   6/10- pt reports for last 24  hours, pain 0/10- no pain- if continues, will wean long acting pain meds. 6/11- will stop AM MS Contin as of tomorrow AM- and monitor- has prns if needed   6/14- will stop QHS Ms Contin- if has pain, has prns- if has electrical sensations again, might need nerve  pain meds.   6/1hasn't had more electrical pain, just aching/throbbing- con't prn pain meds  6/18 controlled  4. Mood: LCSW to follow for evaluation and support.              -antipsychotic agents: N/a 5. Neuropsych: This patient is capable of making decisions on his own behalf. 6. Skin/Wound Care: Routine pressure relief measures.  7. Fluids/Electrolytes/Nutrition: Monitor I/O. Check lytes in am.  8. HTN: Monitor BP tid--continue Norvasc.              --Resume Cozaar if BP continues to be labile    6/1- BP 139/79 this AM- overall borderline high- will wait to restart Cozaar- Of note, HR is 57 this AM  6/2- will restart Cozaar- 25 mg daily- will restart since BP is still elevated in 150s.   6/7- BP well controlled- con't regimen  6/8- BP 150s/90s this AM- usually controlled- will monitor for trend. 6/9- also elevated today- if isn't down by tomorrow, will increase Cozaar. 6/11- BP controlled 130s/80s- con't regimen Vitals:   01/06/21 2132 01/07/21 0511  BP: 132/84 (!) 145/89  Pulse: 67 67  Resp: 16 16  Temp: 97.8 F (36.6 C) 98.1 F (36.7 C)  SpO2: 97% 100%   Fair control 6/18 now on amlodipine 7.5 daily  9. Metastatic prostate cancer to spine: XRT ongoing             --slow steroid taper--4 mg tid X 3 days followed by bid X 7 days-->daily X 7 days, then 2 mg daily X 7d days.   10. At risk for spasticity and neurogenic bowel and bladder- will scan bladder to make sure emptying and monitor bowels.    6/1- Pt reports small BM overnight- con't to monitor for urinary retention- as well as con't bowel meds for constipation.   6/3- LBM overnight- con't meds  6/4-6/5: continue laxatives  6/6- refusing laxatives, but still bleeding  hemorrhoids con't to encourage usage.   6/7- LBM early this AM- con't regimen  6/11- refusing laxatives- con't regimen 11. L foot drop  6/1- will make sure has PRAFO ordered for L and wear at night.  12. Hyperkalemia  6/2- will give dose of Lokelma 10G x1- and will recheck in AM  6/3- K+ down to 5.0- will recheck Monday- might need Lokelma more frequently.   6/6- K+ 4.6 today-con't to monitor  6/9- K+ 4.7 today- will recheck Monday.   6/11- will recheck labs in AM 6/14- K+ 4.9- will recheck Thursday  6/16- still K+ 4.9-  13. Hemorrhoids  6/3- will order Tucks and Anusol suppository since bleeding with BMs.     6/6- refusing miralax as well as anusol suppository even though he's still bleeding. 14. Depressed mood/flat  6/8- will start Celexa 10 mg daily and monitor for side effects.   6/9- pt refusing Celexa- will stop.  15. Azotemia  6/9- pt's Cr up to 1.40 from 1.18- will push fluids- and recheck Monday.   6/10- encouraged pt to drink 6-8 cups /day -admits was drinking 2-3 cups/day- will recheck Monday.   6/15- will recheck Monday since Cr edging upwards  6/16- will start IVFs 75cc hour for 18 hours- so misses therapy and recheck BMP in AM.  16. Bruising of R foot  6/11- will check platelet level- was last clumping- be that was low- could be cause- will check in AM  6/14- Plts 90k this AM- was 114k yesterday- will recheck Thursday.   6/16- plts 91k- likely  why bruising more- con't regimen 17.  Cough - non productive, no SOB, afebrile, WBC normal , will tx symptomatically at this point , Recent CT chest owed mild centrilobular emphysema no other lung abnormalities   LOS: 18 days A FACE TO FACE EVALUATION WAS PERFORMED  Charlett Blake 01/07/2021, 12:22 PM

## 2021-01-08 NOTE — Progress Notes (Signed)
Physical Therapy Session Note  Patient Details  Name: Terry Mitchell MRN: [id number] Date of Birth: [birth year]  Today's Date: 01/08/2021 PT Individual Time: 0900-1000; 8341-9622 PT Individual Time Calculation (min): 60 min and 38 min PT Missed Time: 7 min Missed Time Reason: patient fatigue  Short Term Goals:  Week 3:  PT Short Term Goal 1 (Week 3): =LTG due to ELOS   Skilled Therapeutic Interventions/Progress Updates:    Session 1: Pt received seated in bed, agreeable to PT session. No complaints of pain. Bed mobility Supervision with use of bedrail. Assisted pt with donning knee high TEDs and shoes while seated EOB. Squat pivot transfer to w/c with Supervision. Pt is independent for oral hygiene and washing his face while seated in w/c at sink. Manual w/c propulsion up to 500 ft at Supervision to mod I level navigating elevators and outdoors across uneven ground. Pt is independent for management of w/c parts. Sit to stand with CGA to RW throughout session. Ambulation 2 x 125 ft, 1 x 100 ft with RW and CGA navigated up/down inclines and across uneven ground as well as weaving through obstacles. Pt fatigues quickly with gait training outdoors this date, seated rest breaks needed. Sit to stand x 10 reps with no AD and CGA, intermittent L knee buckling with onset of fatigue. Pt left seated in w/c in room with needs in reach, chair alarm in place at end of session.  Session 2: Pt received seated in w/c in room, agreeable to PT session. No complaints of pain. Manual w/c propulsion to/from therapy gym at mod I level. Sit to stand with CGA to RW throughout session. Ascend/descend 8 x 6" stairs laterally with R handrail and CGA to simulate home environment. Standing LLE 6" step-ups with BUE support and min A for balance, 2 x 7 reps with focus on L knee control. Pt has onset of L knee buckling during last repetition, able to catch balance with min A. Sidesteps L/R 4 x 10 ft each direction in // bars  with CGA for balance, use of mirror for visual feedback, verbal cueing for upright posture and LE coordination. Ambulation x 150 ft with RW and CGA back to room at end of session. Pt exhibits decreased control of LLE with onset of fatigue. Pt left seated in w/c in room with needs in reach at end of session. Pt missed 7 min of scheduled therapy session due to fatigue.  Therapy Documentation Precautions:  Precautions Precautions: Fall Precaution Comments: ataxic LEs, fall risk Restrictions Weight Bearing Restrictions: No    Therapy/Group: Individual Therapy   Excell Seltzer, PT, DPT, CSRS  01/08/2021, 12:11 PM

## 2021-01-08 NOTE — Progress Notes (Signed)
PRN tylenol and oxy ir 5mg 's given at 30. C/O Left foot/ankle pain. Left PRAFO boot applied. BLE edema, mostly pedal, left >right. No other PRN meds requested during night. Terry Mitchell A

## 2021-01-09 LAB — CBC WITH DIFFERENTIAL/PLATELET
Abs Immature Granulocytes: 0.05 10*3/uL (ref 0.00–0.07)
Basophils Absolute: 0 10*3/uL (ref 0.0–0.1)
Basophils Relative: 0 %
Eosinophils Absolute: 0 10*3/uL (ref 0.0–0.5)
Eosinophils Relative: 1 %
HCT: 32.9 % — ABNORMAL LOW (ref 39.0–52.0)
Hemoglobin: 10.8 g/dL — ABNORMAL LOW (ref 13.0–17.0)
Immature Granulocytes: 1 %
Lymphocytes Relative: 9 %
Lymphs Abs: 0.3 10*3/uL — ABNORMAL LOW (ref 0.7–4.0)
MCH: 33.4 pg (ref 26.0–34.0)
MCHC: 32.8 g/dL (ref 30.0–36.0)
MCV: 101.9 fL — ABNORMAL HIGH (ref 80.0–100.0)
Monocytes Absolute: 0.5 10*3/uL (ref 0.1–1.0)
Monocytes Relative: 13 %
Neutro Abs: 2.7 10*3/uL (ref 1.7–7.7)
Neutrophils Relative %: 76 %
Platelets: 97 10*3/uL — ABNORMAL LOW (ref 150–400)
RBC: 3.23 MIL/uL — ABNORMAL LOW (ref 4.22–5.81)
RDW: 13.3 % (ref 11.5–15.5)
WBC: 3.6 10*3/uL — ABNORMAL LOW (ref 4.0–10.5)
nRBC: 0 % (ref 0.0–0.2)

## 2021-01-09 LAB — BASIC METABOLIC PANEL
Anion gap: 8 (ref 5–15)
BUN: 30 mg/dL — ABNORMAL HIGH (ref 8–23)
CO2: 27 mmol/L (ref 22–32)
Calcium: 8.9 mg/dL (ref 8.9–10.3)
Chloride: 101 mmol/L (ref 98–111)
Creatinine, Ser: 1.28 mg/dL — ABNORMAL HIGH (ref 0.61–1.24)
GFR, Estimated: >60 mL/min (ref >60)
Glucose, Bld: 93 mg/dL (ref 70–99)
Potassium: 4.1 mmol/L (ref 3.5–5.1)
Sodium: 136 mmol/L (ref 135–145)

## 2021-01-09 NOTE — Progress Notes (Signed)
Occupational Therapy Session Note  Patient Details  Name: Terry Mitchell MRN: [id number] Date of Birth: Nov 14, 1950  Today's Date: 01/09/2021 OT Individual Time: 1660-6004 OT Individual Time Calculation (min): 70 min    Short Term Goals: Week 3:  OT Short Term Goal 1 (Week 3): STG=LTG secondary to ELOS  Skilled Therapeutic Interventions/Progress Updates:    Pt resting in w/c upon arrival. OT intervention with focus on discharge planning, standing balance, BLE strengthening, w/c mobility, and safety awareness to increase independence with BADLs.  Pt participated in corn hole while standing without BUE support. Pt performed half squat to retrieve bean bags from chair before tossing each bean bag. After rest, pt amb with RW to target and retrieved bean bags from floor using reacher. Pt returned to w/c. Pt used reacher to retrieve bean bags from floor again using reacher and placed on table. Pt cleaned all bags using BUE to complete task. Pt performed squats from EOM with BUE on knees-3x8. Pt propelled w/c to gym. Standing task with 1kg ball chest presses 3x8 with CGA. Pt performed half squats from standing 3x8. Pt propelled w/c back to room and remained in w/c with seat alarm activated. All needs within reach.   Therapy Documentation Precautions:  Precautions Precautions: Fall Precaution Comments: ataxic LEs, fall risk Restrictions Weight Bearing Restrictions: No Pain:  Pt denies pain this morning.   Therapy/Group: Individual Therapy  Leroy Libman 01/09/2021, 12:07 PM

## 2021-01-09 NOTE — Progress Notes (Signed)
Physical Therapy Session Note  Patient Details  Name: Terry Mitchell MRN: [id number] Date of Birth: [birth year]  Today's Date: 01/09/2021 PT Individual Time: 0900-0955 PT Individual Time Calculation (min): 55 min   Short Term Goals:  Week 3:  PT Short Term Goal 1 (Week 3): =LTG due to ELOS   Skilled Therapeutic Interventions/Progress Updates:    Pt received seated in w/c in room, agreeable to PT session. Patients daughter and ex-wife present for hands-on family education session. Pt reports at-level SCI pain in band-like sensation around his abdomen as well as N/T into his LE. Education with patient and family regarding pain and sensation changes due to SCI. Pt is mod I for w/c mobility up to 150 ft, independent for management of w/c parts. Sit to stand with Supervision to RW throughout session. Ambulation up to 200 ft with RW at Supervision level, cues for heel strike and upright trunk. Ascend/descend 4 x 6" stairs with 2 handrails and CGA. Car transfer with RW and Supervision at simulation height of Expedition pt will d/c home in. Ambulation up/down ramp with RW and Supervision, across uneven surface with RW and CGA. Sit to/from supine on real bed in therapy apartment with Supervision. Sit to stand from low, pliable surface of couch with RW and Supervision. Pt's family demonstrates good ability to safely assist pt with gait, transfers, stairs, etc. Pt left seated in w/c in room with needs in reach, family present.  Therapy Documentation Precautions:  Precautions Precautions: Fall Precaution Comments: ataxic LEs, fall risk Restrictions Weight Bearing Restrictions: No     Therapy/Group: Individual Therapy   Excell Seltzer, PT, DPT, CSRS  01/09/2021, 12:06 PM

## 2021-01-09 NOTE — Progress Notes (Signed)
Occupational Therapy Session Note  Patient Details  Name: Terry Mitchell MRN: [id number] Date of Birth: [birth year]  Today's Date: 01/09/2021 OT Individual Time: 0700-0810 OT Individual Time Calculation (min): 70 min    Short Term Goals: Week 3:  OT Short Term Goal 1 (Week 3): STG=LTG secondary to ELOS  Skilled Therapeutic Interventions/Progress Updates:    Pt resting in bed upon arrival. Pt requested to use toilet. Supine>sit EOB with supervision and squat pivot transfer to w/c with supervision. Toilet transfer and toileting with supervision. Pt completed grooming tasks w/c level at sink. NuStep BLE only 2x5 mins level 5. Pt propelled w/c to ADL apartment and practiced furniture transfers with supervision. Standing balance activities without BUE support with close supervision. Pt returned to room and remained in w/c with all needs within reach.   Therapy Documentation Precautions:  Precautions Precautions: Fall Precaution Comments: ataxic LEs, fall risk Restrictions Weight Bearing Restrictions: No Pain: Pt reports that his LLE "feels pretty good" this morning   Therapy/Group: Individual Therapy  Leroy Libman 01/09/2021, 8:12 AM

## 2021-01-09 NOTE — Progress Notes (Signed)
PROGRESS NOTE   Subjective/Complaints:  No pain; doing well- pipe burs tin his room overnight- needed to move rooms.   ROS:  Pt denies SOB, abd pain, CP, N/V/C/D, and vision changes   Objective:   No results found. Recent Labs    01/09/21 0620  WBC 3.6*  HGB 10.8*  HCT 32.9*  PLT 97*   Recent Labs    01/09/21 0620  NA 136  K 4.1  CL 101  CO2 27  GLUCOSE 93  BUN 30*  CREATININE 1.28*  CALCIUM 8.9    Intake/Output Summary (Last 24 hours) at 01/09/2021 1957 Last data filed at 01/09/2021 1747 Gross per 24 hour  Intake 840 ml  Output 1300 ml  Net -460 ml        Physical Exam: Vital Signs Blood pressure 116/71, pulse 84, temperature 99 F (37.2 C), temperature source Oral, resp. rate 18, weight 91 kg, SpO2 100 %.   General: awake, alert, appropriate, NAD HENT: conjugate gaze; oropharynx moist CV: regular rate; no JVD Pulmonary: CTA B/L; no W/R/R- good air movement GI: soft, NT, ND, (+)BS Psychiatric: appropriate but flat Neurological: alert Musculoskeletal:        General: No swelling.     Cervical back: Normal range of motion and neck supple.     Comments: Pt's strength in UEs 5/5 B/L in biceps, triceps, WE, grip and finger abd B/L LEs: RLE- HF 4-/5, KE 4-/5, DF 4+/5, PF 4-/5 LLE: HF 3/5, KE 4-/5, DF 3+/5, and PF 4-/5 - LLE very slightly better- but not quite up on points.  Skin:has some new bruising the top/base of 2nd-5th toes on R foot- brownish purple- no trauma - intact pulse L>R LE edema to ankles 1-2+    General: Skin is warm and dry. Skin looks great except has radiation mark as well as fungal toenails/very thickened   Neurological:     Mental Status: He is alert and oriented to person, place, and time.     Sensory: Sensory deficit present.     Motor: Weakness present.     Comments: Speech clear. Sensory deficits from waist down with paraplegia.  Sensation decreased a lot from T7 on  down to S5 B/L still decreased.     Assessment/Plan: 1. Functional deficits which require 3+ hours per day of interdisciplinary therapy in a comprehensive inpatient rehab setting. Physiatrist is providing close team supervision and 24 hour management of active medical problems listed below. Physiatrist and rehab team continue to assess barriers to discharge/monitor patient progress toward functional and medical goals  Care Tool:  Bathing    Body parts bathed by patient: Right arm, Left arm, Chest, Abdomen, Front perineal area, Buttocks, Right upper leg, Left upper leg, Right lower leg, Left lower leg, Face         Bathing assist Assist Level: Set up assist     Upper Body Dressing/Undressing Upper body dressing   What is the patient wearing?: Pull over shirt    Upper body assist Assist Level: Independent    Lower Body Dressing/Undressing Lower body dressing      What is the patient wearing?: Underwear/pull up, Pants     Lower  body assist Assist for lower body dressing: Supervision/Verbal cueing     Toileting Toileting    Toileting assist Assist for toileting: Supervision/Verbal cueing     Transfers Chair/bed transfer  Transfers assist     Chair/bed transfer assist level: Supervision/Verbal cueing     Locomotion Ambulation   Ambulation assist      Assist level: Supervision/Verbal cueing Assistive device: Walker-rolling Max distance: 200'   Walk 10 feet activity   Assist     Assist level: Supervision/Verbal cueing Assistive device: Walker-rolling   Walk 50 feet activity   Assist Walk 50 feet with 2 turns activity did not occur: Safety/medical concerns  Assist level: Supervision/Verbal cueing Assistive device: Walker-rolling    Walk 150 feet activity   Assist Walk 150 feet activity did not occur: Safety/medical concerns  Assist level: Supervision/Verbal cueing Assistive device: Walker-rolling    Walk 10 feet on uneven surface   activity   Assist Walk 10 feet on uneven surfaces activity did not occur: Safety/medical concerns   Assist level: Contact Guard/Touching assist Assistive device: Aeronautical engineer Will patient use wheelchair at discharge?: No Type of Wheelchair: Manual    Wheelchair assist level: Independent Max wheelchair distance: 500'    Wheelchair 50 feet with 2 turns activity    Assist        Assist Level: Independent   Wheelchair 150 feet activity     Assist      Assist Level: Independent   Blood pressure 116/71, pulse 84, temperature 99 F (37.2 C), temperature source Oral, resp. rate 18, weight 91 kg, SpO2 100 %.  Medical Problem List and Plan: 1.  T6 paraplegia ASIA D secondary to mets to Spinal cord/tumors compressing [place]/prostate CA             -patient may  shower             -ELOS/Goals:10-14 days- goals supervision  Continue PT and OT- Radiation to actually finish Monday- they changed it.    con't PT and OT- asking if will walk "again"- explained we cannot tell, but he's walking some with RW- which is better than expected.   Continue CIR- PT, OT  2.  Antithrombotics: -DVT/anticoagulation:  Pharmaceutical: Lovenox  -will need for at least 2 months due to [place], no matter how far he's walking 6/15- will teach pt/family about Lovenox starting tomorrow- will place order.  6/20- will go home on Lovenox- for a total of  2 months             -antiplatelet therapy: N/a 3. Pain Management: MS contin bid with oxycodone prn. Continue and change as required 6/14- will stop QHS Ms Contin- if has pain, has prns- if has electrical sensations again, might need nerve pain meds.   6/1hasn't had more electrical pain, just aching/throbbing- con't prn pain meds 6/20- pain controlled- off meds- con't regimen-will send home on a few prns.  4. Mood: LCSW to follow for evaluation and support.              -antipsychotic agents: N/a 5. Neuropsych: This  patient is capable of making decisions on his own behalf. 6. Skin/Wound Care: Routine pressure relief measures.  7. Fluids/Electrolytes/Nutrition: Monitor I/O. Check lytes in am.  8. HTN: Monitor BP tid--continue Norvasc.              --Resume Cozaar if BP continues to be labile    6/1- BP 139/79 this AM- overall  borderline high- will wait to restart Cozaar- Of note, HR is 57 this AM  6/2- will restart Cozaar- 25 mg daily- will restart since BP is still elevated in 150s.   6/7- BP well controlled- con't regimen  6/8- BP 150s/90s this AM- usually controlled- will monitor for trend. 6/9- also elevated today- if isn't down by tomorrow, will increase Cozaar. 6/11- BP controlled 130s/80s- con't regimen 6/20- BP overall controlled con't regimen Vitals:   01/09/21 0526 01/09/21 1311  BP: (!) 147/85 116/71  Pulse: 65 84  Resp: 20 18  Temp: 98.2 F (36.8 C) 99 F (37.2 C)  SpO2: 100% 100%    9. Metastatic prostate cancer to spine: XRT ongoing             --slow steroid taper--4 mg tid X 3 days followed by bid X 7 days-->daily X 7 days, then 2 mg daily X 7d days.   10. At risk for spasticity and neurogenic bowel and bladder- will scan bladder to make sure emptying and monitor bowels.    6/1- Pt reports small BM overnight- con't to monitor for urinary retention- as well as con't bowel meds for constipation.   6/3- LBM overnight- con't meds  6/4-6/5: continue laxatives  6/6- refusing laxatives, but still bleeding hemorrhoids con't to encourage usage.   6/7- LBM early this AM- con't regimen  6/11- refusing laxatives- con't regimen 11. L foot drop  6/1- will make sure has PRAFO ordered for L and wear at night.  12. Hyperkalemia  6/2- will give dose of Lokelma 10G x1- and will recheck in AM  6/3- K+ down to 5.0- will recheck Monday- might need Lokelma more frequently.   6/6- K+ 4.6 today-con't to monitor  6/9- K+ 4.7 today- will recheck Monday.   6/11- will recheck labs in AM 6/14- K+ 4.9-  will recheck Thursday  6/16- still K+ 4.9-  6/20- K+ down to 4.1- doing well- con't to monitor 13. Hemorrhoids  6/3- will order Tucks and Anusol suppository since bleeding with BMs.     6/6- refusing miralax as well as anusol suppository even though he's still bleeding. 14. Depressed mood/flat  6/8- will start Celexa 10 mg daily and monitor for side effects.   6/9- pt refusing Celexa- will stop.  15. Azotemia  6/9- pt's Cr up to 1.40 from 1.18- will push fluids- and recheck Monday.   6/10- encouraged pt to drink 6-8 cups /day -admits was drinking 2-3 cups/day- will recheck Monday.   6/15- will recheck Monday since Cr edging upwards  6/16- will start IVFs 75cc hour for 18 hours- so misses therapy and recheck BMP in AM.   6/20- better Cr 1.28- con't regimen 16. Bruising of R foot  6/11- will check platelet level- was last clumping- be that was low- could be cause- will check in AM  6/14- Plts 90k this AM- was 114k yesterday- will recheck Thursday.   6/16- plts 91k- likely why bruising more- con't regimen 17.  Cough - non productive, no SOB, afebrile, WBC normal , will tx symptomatically at this point , Recent CT chest owed mild centrilobular emphysema no other lung abnormalities   LOS: 20 days A FACE TO FACE EVALUATION WAS PERFORMED  [name] Sagona 01/09/2021, 7:57 PM

## 2021-01-10 NOTE — Patient Care Conference (Signed)
Inpatient RehabilitationTeam Conference and Plan of Care Update Date: 01/10/2021   Time: 11:15 AM    Patient Name: Terry Mitchell      Medical Record Number: [id number]  Date of Birth: 1950/12/27 Sex: Male         Room/Bed: 4W20C/4W20C-01 Payor Info: Payor: [employer] MEDICARE / Plan: [employer] HMO / Product Type: *No Product type* /    Admit Date/Time:  12/20/2020  2:31 PM  Primary Diagnosis:  Acute incomplete paraplegia [facility])  Hospital Problems: Principal Problem:   Acute incomplete paraplegia [facility]) Active Problems:   Prostate cancer metastatic to bone [facility])    Expected Discharge Date: Expected Discharge Date: 01/12/21  Team Members Present: Physician leading conference: Dr. Courtney Heys Care Coodinator Present: Loralee Pacas, LCSWA;Katlynne Mckercher Creig Hines, RN, BSN, CRRN Nurse Present: Dorthula Nettles, RN PT Present: Excell Seltzer, PT OT Present: Roanna Epley, COTA;Jennifer Tamala Julian, OT PPS Coordinator present : Gunnar Fusi, SLP     Current Status/Progress Goal Weekly Team Focus  Bowel/Bladder     Continent B/B Remain continent     Swallow/Nutrition/ Hydration             ADL's   bathing/dressing-supervision; toilet tranfsers/toileting-supervision; functional transfers-mod I squat pivot; supervision with RW  supervision overall  tranfsers, standing balance, activity tolerance, safety awareness   Mobility   Supervision bed mobility, Supervision to CGA transfers with RW, gait up to 180 ft with RW Supervision to CGA, CGA stairs, mod I w/c mobility  Supervision overall  family edu, d/c planning, gait, balance   Communication             Safety/Cognition/ Behavioral Observations            Pain     No complaints of pain Remain pain free   Assess q 4 hr and PRN  Skin     CDI   Remain free from breakdown Assess q shift and PRN    Discharge Planning:  D/c to home with 24/7 care from various family members. D/C location is pending. Last radiation tx on 6/6. Fam edu  completed on 6/20 with pt dtr.   Team Discussion: Bowels working, off opioids, denies pain. Continent B/B.  Patient on target to meet rehab goals: yes, supervision overall with mobility. Will not need OT follow-up. On target to meet goals for Thursday discharge.  *See Care Plan and progress notes for long and short-term goals.   Revisions to Treatment Plan:  Not at this time.  Teaching Needs: Family education, medication management, Lovenox injections, transfer training, gait training, stair training, balance training, endurance training, safety awareness.  Current Barriers to Discharge: Decreased caregiver support, Medical stability, Home enviroment access/layout, Lack of/limited family support, Medication compliance, and Behavior  Possible Resolutions to Barriers: Continue current medications, provide emotional support.     Medical Summary Current Status: conitnent B/B- denies pain most times; educating on lovenox; and skin  Barriers to Discharge: Decreased family/caregiver support;Home enviroment access/layout  Barriers to Discharge Comments: confirming daughter vs his home- doing outpt PT and OT- will need RW and tub transfer bench; Possible Resolutions to [employer] Focus: walking 150-261ft regularly- doesn't need w/c; won't need outpt OT; d/c 6/23- thursday   Continued Need for Acute Rehabilitation Level of Care: The patient requires daily medical management by a physician with specialized training in physical medicine and rehabilitation for the following reasons: Direction of a multidisciplinary physical rehabilitation program to maximize functional independence : Yes Medical management of patient stability for increased activity during participation in  an intensive rehabilitation regime.: Yes Analysis of laboratory values and/or radiology reports with any subsequent need for medication adjustment and/or medical intervention. : Yes   I attest that I was present, lead the  team conference, and concur with the assessment and plan of the team.   Cristi Loron 01/10/2021, 6:07 PM

## 2021-01-10 NOTE — Progress Notes (Signed)
PROGRESS NOTE   Subjective/Complaints:  LBM this AM- doing well- denies pain; "not too bad"- will "be better when he goes home".   ROS:  Pt denies SOB, abd pain, CP, N/V/C/D, and vision changes   Objective:   No results found. Recent Labs    01/09/21 0620  WBC 3.6*  HGB 10.8*  HCT 32.9*  PLT 97*   Recent Labs    01/09/21 0620  NA 136  K 4.1  CL 101  CO2 27  GLUCOSE 93  BUN 30*  CREATININE 1.28*  CALCIUM 8.9    Intake/Output Summary (Last 24 hours) at 01/10/2021 0844 Last data filed at 01/10/2021 0456 Gross per 24 hour  Intake 400 ml  Output 1300 ml  Net -900 ml        Physical Exam: Vital Signs Blood pressure 117/74, pulse 75, temperature 98.6 F (37 C), temperature source Oral, resp. rate 20, weight 91 kg, SpO2 100 %.    General: awake, alert, appropriate, sitting up in w/c- just wheeled back from gym with OT; NAD HENT: conjugate gaze; oropharynx moist CV: regular rate; no JVD Pulmonary: CTA B/L; no W/R/R- good air movement GI: soft, NT, ND, (+)BS Psychiatric: appropriate but flat;  Neurological: alert Musculoskeletal:        General: No swelling.     Cervical back: Normal range of motion and neck supple.     Comments: Pt's strength in UEs 5/5 B/L in biceps, triceps, WE, grip and finger abd B/L LEs: RLE- HF 4-/5, KE 4-/5, DF 4+/5, PF 4-/5 LLE: HF 3/5, KE 4-/5, DF 3+/5, and PF 4-/5 - LLE very slightly better- but not quite up on points.  Skin:has some new bruising the top/base of 2nd-5th toes on R foot- brownish purple- no trauma - intact pulse L>R LE edema to ankles 1-2+    General: Skin is warm and dry. Skin looks great except has radiation mark as well as fungal toenails/very thickened   Neurological:     Mental Status: He is alert and oriented to person, place, and time.     Sensory: Sensory deficit present.     Motor: Weakness present.     Comments: Speech clear. Sensory deficits  from waist down with paraplegia.  Sensation decreased a lot from T7 on down to S5 B/L still decreased.- no change     Assessment/Plan: 1. Functional deficits which require 3+ hours per day of interdisciplinary therapy in a comprehensive inpatient rehab setting. Physiatrist is providing close team supervision and 24 hour management of active medical problems listed below. Physiatrist and rehab team continue to assess barriers to discharge/monitor patient progress toward functional and medical goals  Care Tool:  Bathing    Body parts bathed by patient: Right arm, Left arm, Chest, Abdomen, Front perineal area, Buttocks, Right upper leg, Left upper leg, Right lower leg, Left lower leg, Face         Bathing assist Assist Level: Set up assist     Upper Body Dressing/Undressing Upper body dressing   What is the patient wearing?: Pull over shirt    Upper body assist Assist Level: Independent    Lower Body Dressing/Undressing Lower body dressing  What is the patient wearing?: Underwear/pull up, Pants     Lower body assist Assist for lower body dressing: Supervision/Verbal cueing     Toileting Toileting    Toileting assist Assist for toileting: Supervision/Verbal cueing     Transfers Chair/bed transfer  Transfers assist     Chair/bed transfer assist level: Supervision/Verbal cueing     Locomotion Ambulation   Ambulation assist      Assist level: Supervision/Verbal cueing Assistive device: Walker-rolling Max distance: 200'   Walk 10 feet activity   Assist     Assist level: Supervision/Verbal cueing Assistive device: Walker-rolling   Walk 50 feet activity   Assist Walk 50 feet with 2 turns activity did not occur: Safety/medical concerns  Assist level: Supervision/Verbal cueing Assistive device: Walker-rolling    Walk 150 feet activity   Assist Walk 150 feet activity did not occur: Safety/medical concerns  Assist level: Supervision/Verbal  cueing Assistive device: Walker-rolling    Walk 10 feet on uneven surface  activity   Assist Walk 10 feet on uneven surfaces activity did not occur: Safety/medical concerns   Assist level: Contact Guard/Touching assist Assistive device: Aeronautical engineer Will patient use wheelchair at discharge?: No Type of Wheelchair: Manual    Wheelchair assist level: Independent Max wheelchair distance: 500'    Wheelchair 50 feet with 2 turns activity    Assist        Assist Level: Independent   Wheelchair 150 feet activity     Assist      Assist Level: Independent   Blood pressure 117/74, pulse 75, temperature 98.6 F (37 C), temperature source Oral, resp. rate 20, weight 91 kg, SpO2 100 %.  Medical Problem List and Plan: 1.  T6 paraplegia ASIA D secondary to mets to Spinal cord/tumors compressing [place]/prostate CA             -patient may  shower             -ELOS/Goals:10-14 days- goals supervision  Continue PT and OT- Radiation to actually finish Monday- they changed it.    con't PT and OT- asking if will walk "again"- explained we cannot tell, but he's walking some with RW- which is better than expected.   Continue CIR- PT, OT  2.  Antithrombotics: -DVT/anticoagulation:  Pharmaceutical: Lovenox  -will need for at least 2 months due to [place], no matter how far he's walking 6/15- will teach pt/family about Lovenox starting tomorrow- will place order.  6/20- will go home on Lovenox- for a total of  2 months 6/21- is trained how to give  Lovenox and is ready to do so at home.              -antiplatelet therapy: N/a 3. Pain Management: MS contin bid with oxycodone prn. Continue and change as required 6/14- will stop QHS Ms Contin- if has pain, has prns- if has electrical sensations again, might need nerve pain meds.   6/1hasn't had more electrical pain, just aching/throbbing- con't prn pain meds 6/20- pain controlled- off meds- con't  regimen-will send home on a few prns.   6/21- going to get more treatment after leaves- will give 1 month supply prns at d/c.  4. Mood: LCSW to follow for evaluation and support.              -antipsychotic agents: N/a 5. Neuropsych: This patient is capable of making decisions on his own behalf. 6. Skin/Wound Care: Routine pressure  relief measures.  7. Fluids/Electrolytes/Nutrition: Monitor I/O. Check lytes in am.  8. HTN: Monitor BP tid--continue Norvasc.              --Resume Cozaar if BP continues to be labile    6/1- BP 139/79 this AM- overall borderline high- will wait to restart Cozaar- Of note, HR is 57 this AM  6/2- will restart Cozaar- 25 mg daily- will restart since BP is still elevated in 150s.  6/21- BP controlled and swelling in legs better- con't regimen Vitals:   01/10/21 0436 01/10/21 0729  BP: (!) 134/91 117/74  Pulse: 72 75  Resp: 20   Temp: 98.6 F (37 C)   SpO2: 100%     9. Metastatic prostate cancer to spine: XRT ongoing             --slow steroid taper--4 mg tid X 3 days followed by bid X 7 days-->daily X 7 days, then 2 mg daily X 7d days.   10. At risk for spasticity and neurogenic bowel and bladder- will scan bladder to make sure emptying and monitor bowels.    6/1- Pt reports small BM overnight- con't to monitor for urinary retention- as well as con't bowel meds for constipation.   6/3- LBM overnight- con't meds  6/4-6/5: continue laxatives  6/6- refusing laxatives, but still bleeding hemorrhoids con't to encourage usage.   6/7- LBM early this AM- con't regimen  6/11- refusing laxatives- con't regimen 11. L foot drop  6/1- will make sure has PRAFO ordered for L and wear at night.  12. Hyperkalemia  6/2- will give dose of Lokelma 10G x1- and will recheck in AM  6/3- K+ down to 5.0- will recheck Monday- might need Lokelma more frequently.   6/6- K+ 4.6 today-con't to monitor  6/9- K+ 4.7 today- will recheck Monday.   6/11- will recheck labs in AM 6/14- K+  4.9- will recheck Thursday  6/16- still K+ 4.9-  6/20- K+ down to 4.1- doing well- con't to monitor 13. Hemorrhoids  6/3- will order Tucks and Anusol suppository since bleeding with BMs.     6/6- refusing miralax as well as anusol suppository even though he's still bleeding. 14. Depressed mood/flat  6/8- will start Celexa 10 mg daily and monitor for side effects.   6/9- pt refusing Celexa- will stop.  15. Azotemia  6/9- pt's Cr up to 1.40 from 1.18- will push fluids- and recheck Monday.   6/10- encouraged pt to drink 6-8 cups /day -admits was drinking 2-3 cups/day- will recheck Monday.   6/15- will recheck Monday since Cr edging upwards  6/16- will start IVFs 75cc hour for 18 hours- so misses therapy and recheck BMP in AM.   6/20- better Cr 1.28- con't regimen 16. Bruising of R foot  6/11- will check platelet level- was last clumping- be that was low- could be cause- will check in AM  6/14- Plts 90k this AM- was 114k yesterday- will recheck Thursday.   6/16- plts 91k- likely why bruising more- con't regimen  6/21- Plts up slight to 97k 17.  Cough - non productive, no SOB, afebrile, WBC normal , will tx symptomatically at this point , Recent CT chest owed mild centrilobular emphysema no other lung abnormalities   LOS: 21 days A FACE TO FACE EVALUATION WAS PERFORMED  Jenell Dobransky 01/10/2021, 8:44 AM

## 2021-01-10 NOTE — Progress Notes (Signed)
Physical Therapy Session Note  Patient Details  Name: Terry Mitchell MRN: [id number] Date of Birth: Aug 24, 1950  Today's Date: 01/10/2021 PT Individual Time: 1415-1515 PT Individual Time Calculation (min): 60 min   Short Term Goals: Week 3:  PT Short Term Goal 1 (Week 3): =LTG due to ELOS  Skilled Therapeutic Interventions/Progress Updates:    Pt received seated in w/c in room, agreeable to PT session. No complaints of pain. Manual w/c propulsion up to 200 ft at mod I level. Pt is independent for management of w/c parts. Stand pivot transfer w/c to mat table with RW and Supervision. Session focus on providing HEP, see below:  Access Code: [email or web address] URL: [email or web address] Date: 01/10/2021 Prepared by: Excell Seltzer  Exercises Mini Squat with Counter Support - 1 x daily - 7 x weekly - 3 sets - 10 reps Side Stepping with Counter Support - 1 x daily - 7 x weekly - 3 sets - 10 reps Step Taps on High Step/Cone Taps - 1 x daily - 7 x weekly - 3 sets - 10 reps  Ambulation x 150 ft with RW and Supervision, cues for B heel strike and upright trunk during gait. Standing balance with no UE support and min A performing rebounder ball toss x 25 reps, x 20 reps. Pt exhibits anterior lean with onset of fatigue, able to correct with min A. Sidestepping L/R over cones in // bars with CGA for balance. Pt reports feeling fatigued following this activity, returned to sitting in w/c. Manual w/c propulsion back to room at mod I level. Pt left seated in w/c in room with needs in reach at end of session.  Therapy Documentation Precautions:  Precautions Precautions: Fall Precaution Comments: ataxic LEs, fall risk Restrictions Weight Bearing Restrictions: No    Therapy/Group: Individual Therapy   Excell Seltzer, PT, DPT, CSRS  01/10/2021, 4:50 PM

## 2021-01-10 NOTE — Progress Notes (Signed)
Occupational Therapy Session Note  Patient Details  Name: Terry Mitchell MRN: [id number] Date of Birth: 1950/12/11  Today's Date: 01/10/2021 OT Individual Time: 0700-0800 OT Individual Time Calculation (min): 60 min    Short Term Goals: Week 3:  OT Short Term Goal 1 (Week 3): STG=LTG secondary to ELOS  Skilled Therapeutic Interventions/Progress Updates:    Pt resting in bed upon arrival and requesting to use toilet. Bed mobility and squat pivot transfer to w/c with supervision. Pt completed toilet tranfser and toileting tasks with supervision. Grooming at sink. Pt propelled w/c to gym and transferred to NuStep-5 min level 6 and 5 min level 5 with BLE only. Pt stated he could tell a difference with increase in resistance. Pt amb with RW (supervision) back to nursing station and requested to sit in w/c. Pt propelled w/c back to room. Pt remained in w/c with all needs within reach and seat alarm activated.   Therapy Documentation Precautions:  Precautions Precautions: Fall Precaution Comments: ataxic LEs, fall risk Restrictions Weight Bearing Restrictions: No Pain:  Pt states he had some "pain medicine" earlier and is feeling ok     Therapy/Group: Individual Therapy  Leroy Libman 01/10/2021, 8:02 AM

## 2021-01-10 NOTE — Progress Notes (Signed)
Physical Therapy Session Note  Patient Details  Name: Terry Mitchell MRN: [id number] Date of Birth: 1950-09-26  Today's Date: 01/10/2021 PT Individual Time: 1000-1100 PT Individual Time Calculation (min): 60 min   Short Term Goals: Week 3:  PT Short Term Goal 1 (Week 3): =LTG due to ELOS  Skilled Therapeutic Interventions/Progress Updates:     Pt greeted seated in w/c to start session, agreeable to PT tx. No reports of pain - excited about upcoming DC to his daughter's place. Pt propelled himself in w/c mod I, ~362f, to elevators. Transported remaining distance for time management where we went outdoors to practice gait on unlevel concrete surfaces. He ambulated 2x1356fwith CGA and RW, x1 LOB while turning L requiring min/modA for correction. LOB contributed to head turns and quick decision making. With seated rest breaks, discussed fall reduction strategies, home safety management, and general DC planning. Returned upstairs to 37MEMCORWorked on sit<>stand transfers while holding medium sized therapy ball - requiring minA to power to rise and cues for full thoracic extension with rasing ball overhead - 2x5 reps completed. Next, worked on side stepping in standing over 1 inch hurdle with BUE support to therapist's shoulders. He required modA for balance while completing this with great difficulty producing accurate foot placement for side stepping motions. Next, worked on supine there-ex with bridges and knee-to-chest leg press, 2x10 reps each. He was able to come to sitting from mat table with CGA and then completed squat<>pivot transfer with supervision back to his w/c. He was returned to his room and remained seated in w/c with all needs met, chair alarm on. Pt appreciative of services.   Therapy Documentation Precautions:  Precautions Precautions: Fall Precaution Comments: ataxic LEs, fall risk Restrictions Weight Bearing Restrictions: No General:     Therapy/Group: Individual  Therapy  ChAlger Simons/21/2022, 7:42 AM

## 2021-01-10 NOTE — Progress Notes (Signed)
Patient ID: Terry Mitchell, male   DOB: 03/18/1951, 70 y.o.   MRN: [id number]  SW met with pt in room to review discharge recommendations: outpatient therapies, and DME: RW and TTB. Pt prefers outpatient PT/OT at [facility]. Pt aware SW to follow-up with his dtr.   1809- SW made efforts to make contact with pt dtr Catherine ([phone]) but unable to leave message as vm not set up. SW will continue to make efforts.   SW ordered DME: TTB and RW with [employer] [facility] via parachute.    , MSW, LCSWA Office: [phone] Cell: [phone] Fax: [phone]  

## 2021-01-10 NOTE — Progress Notes (Signed)
Occupational Therapy Session Note  Patient Details  Name: Terry Mitchell MRN: [id number] Date of Birth: [birth year]  Today's Date: 01/10/2021 OT Individual Time: 1130-1158 OT Individual Time Calculation (min): 28 min    Short Term Goals: Week 3:  OT Short Term Goal 1 (Week 3): STG=LTG secondary to ELOS  Skilled Therapeutic Interventions/Progress Updates:    OT intervention with focus on dynamic standing balance, w/c mobility, activity tolerance, and safety awareness to increase independence with BADLs. Sit>stand from EOM with no UE support to retrieve cards from card board, stand>sit with no UE support. Pt repeated task x10 with supervision. Pt tossed 1kg ball against rebounder standing with no UE support with CGA. Pt propelled w/c back to room. Pt remained in w/c with all needs within reach and seat alarm activated.  Therapy Documentation Precautions:  Precautions Precautions: Fall Precaution Comments: ataxic LEs, fall risk Restrictions Weight Bearing Restrictions: No Pain:  Pt denies pain this morning   Therapy/Group: Individual Therapy  Leroy Libman 01/10/2021, 11:58 AM

## 2021-01-11 NOTE — Progress Notes (Signed)
Patient ID: Terry Mitchell, male   DOB: 1950-10-27, 70 y.o.   MRN: [id number]  1343-SW made efforts to make contact with pt dtr [name] [phone]) to give updates, but unable to leave message as vm not set up. SW will continue to make efforts.   SW faxed outpatient PT referral to [facility] (p:[phone]/f:[phone]).  Loralee Pacas, MSW, [place] Office: [phone] Cell: [phone] Fax: [phone]

## 2021-01-11 NOTE — Progress Notes (Signed)
Occupational Therapy Discharge Summary  Patient Details  Name: Terry Mitchell MRN: [id number] Date of Birth: Nov 01, 1950  Patient has met 9 of 9 long term goals due to improved activity tolerance, improved balance, postural control, and ability to compensate for deficits.  Pt made excellent progress with BADLs and functional transfers during this admission. Pt is mod I for toilet transfers and toileting and Independent for UB dressing tasks.  Pt completes all other BADLs with setup/supervision. Tub/shower transfers are supervision with TTB. Pt is discharging to his daughter's home and she has participated in PT therapy sessions. Pt verbalized understanding of all safety recommendations. Patient to discharge at overall Supervision level.  Patient's care partner is independent to provide the necessary physical assistance at discharge.     Recommendation:  No f/u recommended at this time   Equipment: Tub transfer bench  Reasons for discharge: lack of progress toward goals and treatment goals met  Patient/family agrees with progress made and goals achieved: Yes  OT Discharge Vision Baseline Vision/History: No visual deficits (pt states he is nearsighted) Patient Visual Report: No change from baseline Vision Assessment?: No apparent visual deficits Perception  Perception: Within Functional Limits Praxis Praxis: Intact Cognition Overall Cognitive Status: Within Functional Limits for tasks assessed Arousal/Alertness: Awake/alert Orientation Level: Oriented X4 Attention: Focused;Sustained Focused Attention: Appears intact Sustained Attention: Appears intact Memory: Appears intact Awareness: Appears intact Problem Solving: Appears intact Safety/Judgment: Appears intact Sensation Sensation Light Touch: Impaired Detail Light Touch Impaired Details: Impaired RLE;Impaired LLE Hot/Cold: Appears Intact Proprioception: Impaired Detail Proprioception Impaired Details: Impaired  RLE;Impaired LLE Coordination Gross Motor Movements are Fluid and Coordinated: No Fine Motor Movements are Fluid and Coordinated: Yes Coordination and Movement Description: ataxic BLE Finger Nose Finger Test: WNL Motor  Motor Motor: Paraplegia;Abnormal tone;Ataxia;Abnormal postural alignment and control Trunk/Postural Assessment  Cervical Assessment Cervical Assessment: Within Functional Limits Thoracic Assessment Thoracic Assessment: Within Functional Limits Lumbar Assessment Lumbar Assessment:  (posterior pelvic tilt) Postural Control Righting Reactions: delayed/insufficient  Balance Static Sitting Balance Static Sitting - Balance Support: Feet supported Static Sitting - Level of Assistance: 7: Independent Dynamic Sitting Balance Dynamic Sitting - Balance Support: During functional activity Dynamic Sitting - Level of Assistance: 6: Modified independent (Device/Increase time) Static Standing Balance Static Standing - Level of Assistance: 6: Modified independent (Device/Increase time) Dynamic Standing Balance Dynamic Standing - Balance Support: During functional activity Dynamic Standing - Level of Assistance: 5: Stand by assistance Extremity/Trunk Assessment RUE Assessment RUE Assessment: Within Functional Limits     Leroy Libman 01/11/2021, 6:43 AM

## 2021-01-11 NOTE — Progress Notes (Signed)
Occupational Therapy Session Note  Patient Details  Name: Terry Mitchell MRN: [id number] Date of Birth: [birth year]  Today's Date: 01/11/2021 OT Individual Time: 5056-9794 OT Individual Time Calculation (min): 75 min    Short Term Goals: Week 3:  OT Short Term Goal 1 (Week 3): STG=LTG secondary to ELOS  Skilled Therapeutic Interventions/Progress Updates:    Pt resting in bed upon arrival. Pt stated he got up during the night to use the bathroom and also stood at sink to wash up and wash his clothing. Pt stated he didn't need to wash up or change clothing this morning. Pt requested to use bathroom. Pt amb with RW into bathroom and completed toileting tasks at mod I level. Pt returned to room and completed grooming tasks. Pt propelled w/c to day room and amb approx 20' to NuStep-5 mins level 6 and 5 mins level 5 BLE only. Pt amb back to room with supervision. Reviewed home safety recommendations. Pt pleased with progress and ready for discharge home tomorrow.   Therapy Documentation Precautions:  Precautions Precautions: Fall Precaution Comments: ataxic LEs, fall risk Restrictions Weight Bearing Restrictions: No Pain: Pt reports that his LLE/foot does throb some during the night; MD aware   Therapy/Group: Individual Therapy  Leroy Libman 01/11/2021, 8:22 AM

## 2021-01-11 NOTE — Progress Notes (Signed)
Physical Therapy Discharge Summary  Patient Details  Name: Terry Mitchell MRN: [id number] Date of Birth: [birth year]  Today's Date: 01/11/2021  Patient has met 9 of 10 long term goals due to improved activity tolerance, improved balance, improved postural control, increased strength, and ability to compensate for deficits.  Patient to discharge at an ambulatory level Supervision.   Patient's care partner is independent to provide the necessary physical assistance at discharge. Pt's daughter has completed hands-on family education and is safe to assist patient upon d/c home.  Reasons goals not met: Patient did not meet stair goal level of Supervision as he still requires CGA. Pt's family is able to provide this level of assist.  Recommendation:  Patient will benefit from ongoing skilled PT services in outpatient setting to continue to advance safe functional mobility, address ongoing impairments in endurance, strength, coordination, balance, safety, independence with functional mobility, and minimize fall risk.  Equipment: RW  Reasons for discharge: treatment goals met and discharge from hospital  Patient/family agrees with progress made and goals achieved: Yes  PT Discharge Precautions/Restrictions Precautions Precautions: Fall Restrictions Weight Bearing Restrictions: No Vision/Perception  Perception Perception: Within Functional Limits Praxis Praxis: Intact  Cognition Overall Cognitive Status: Within Functional Limits for tasks assessed Arousal/Alertness: Awake/alert Attention: Focused;Sustained Focused Attention: Appears intact Sustained Attention: Appears intact Memory: Appears intact Awareness: Appears intact Problem Solving: Appears intact Safety/Judgment: Appears intact Sensation Sensation Light Touch: Impaired Detail Light Touch Impaired Details: Impaired RLE;Impaired LLE Proprioception: Impaired Detail Proprioception Impaired Details: Impaired RLE;Impaired  LLE Additional Comments: improved since eval Coordination Gross Motor Movements are Fluid and Coordinated: No Fine Motor Movements are Fluid and Coordinated: Yes Coordination and Movement Description: ataxic BLE, improved since eval Heel Shin Test: impaired B; ataxic Motor  Motor Motor: Paraplegia;Abnormal tone;Ataxia;Abnormal postural alignment and control Motor - Skilled Clinical Observations: impaired 2/2 T6 incomplete para Motor - Discharge Observations: improved since eval  Mobility Bed Mobility Bed Mobility: Rolling Right;Rolling Left;Supine to Sit;Sit to Supine Rolling Right: Independent Rolling Left: Independent Supine to Sit: Independent Sit to Supine: Independent Transfers Transfers: Sit to Stand;Stand Occupational hygienist via Geophysicist/field seismologist Sit to Stand: Independent with assistive device Stand Pivot Transfers: Independent with assistive device Squat Pivot Transfers: Independent with assistive device Transfer (Assistive device): Rolling walker Locomotion  Gait Ambulation: Yes Gait Assistance: Supervision/Verbal cueing Assistive device: Rolling walker Gait Assistance Details: Verbal cues for technique;Verbal cues for gait pattern;Verbal cues for safe use of DME/AE Gait Gait: Yes Gait Pattern: Impaired Gait Pattern: Decreased step length - right;Decreased dorsiflexion - left;Scissoring;Ataxic Gait velocity: decreased Stairs / Additional Locomotion Stairs: Yes Stairs Assistance: Contact Guard/Touching assist Stair Management Technique: Two rails;Step to pattern Number of Stairs: 12 Height of Stairs: 6 Wheelchair Mobility Wheelchair Mobility: Yes Wheelchair Assistance: Independent with Camera operator: Both upper extremities Wheelchair Parts Management: Independent  Trunk/Postural Assessment  Cervical Assessment Cervical Assessment: Within Scientist, physiological Assessment: Within  Functional Limits Lumbar Assessment Lumbar Assessment: Exceptions to [facility] (posterior pelvic tilt) Postural Control Postural Control: Deficits on evaluation Righting Reactions: delayed  Balance Balance Balance Assessed: Yes Static Sitting Balance Static Sitting - Balance Support: No upper extremity supported;Feet supported Dynamic Sitting Balance Dynamic Sitting - Balance Support: No upper extremity supported;Feet supported;During functional activity Dynamic Sitting - Level of Assistance: 6: Modified independent (Device/Increase time) Static Standing Balance Static Standing - Balance Support: Bilateral upper extremity supported;During functional activity Static Standing - Level of Assistance: 6: Modified independent (Device/Increase time) Dynamic Standing Balance Dynamic Standing -  Balance Support: Bilateral upper extremity supported;During functional activity Dynamic Standing - Level of Assistance: 5: Stand by assistance Extremity Assessment   RLE Assessment RLE Assessment: Within Functional Limits General Strength Comments: 4/5 grossly LLE Assessment LLE Assessment: Exceptions to [facility] General Strength Comments: impaired, see below LLE Strength LLE Overall Strength: Deficits Left Hip Flexion: 4/5 Left Knee Flexion: 3+/5 Left Knee Extension: 4/5 Left Ankle Dorsiflexion: 3/5     Excell Seltzer, PT, DPT, CSRS 01/11/2021, 5:07 PM

## 2021-01-11 NOTE — Progress Notes (Signed)
Physical Therapy Session Note  Patient Details  Name: Terry Mitchell MRN: [id number] Date of Birth: 29-Oct-1950  Today's Date: 01/11/2021 PT Individual Time: 1400-1500 PT Individual Time Calculation (min): 60 min   Short Term Goals: Week 3:  PT Short Term Goal 1 (Week 3): =LTG due to ELOS  Skilled Therapeutic Interventions/Progress Updates:    Pt received seated in w/c in room, agreeable to PT session. Pt is independent for w/c mobility, mod I for transfers with RW, Supervision for gait up to 300 ft during session with use of RW. Static standing balance on airex 3 x 30 sec with no UE support and CGA for balance. Standing 2 kg weighted ball punch-outs, 2 x 15 reps to fatigue. Sit to stand x 7, x 8, x 10 reps with no UE support and min A for balance with focus on LE strengthening and decreased UE reliance. Pt left seated in w/c in room with needs in reach at end of session.  Therapy Documentation Precautions:  Precautions Precautions: Fall Precaution Comments: ataxic LEs, fall risk Restrictions Weight Bearing Restrictions: No    Therapy/Group: Individual Therapy   Excell Seltzer, PT, DPT, CSRS  01/11/2021, 5:01 PM

## 2021-01-11 NOTE — Progress Notes (Signed)
Physical Therapy Session Note  Patient Details  Name: Terry Mitchell MRN: [id number] Date of Birth: 1951-07-12  Today's Date: 01/11/2021 PT Individual Time: 0916-1029 PT Individual Time Calculation (min): 73 min   Short Term Goals: Week 3:  PT Short Term Goal 1 (Week 3): =LTG due to ELOS  Skilled Therapeutic Interventions/Progress Updates: Pt presents sitting in w/c and agreeable to therapy.  Pt transferred sit to stand w/ supervision and amb 150' to Dayroom.  Pt amb through cone obstacle course w/ supervision, verbal cues for improved step-through gait pattern w/ LLE as rounds corners.  Pt able to pick up small cup from floor w/ min A using RW.  Pt wheeled self to small gym w/ supervision.  Pt amb x 5' to simulated SUV height and transferred in and out w/ supervision, including bringing Les.  Pt wheeled self to main gym and amb to stairs and performed (12) 6' height steps (4 steps x 3 ) w/ B rails and CGA.  Pt requires verbal cues for LLE placement when ascending.  Pt amb 150' w/ RW and supervision back to room.  Pt remained seated in w/c w/ seat alarm on and needs in reach.  Pt is able to wheel self in room.     Therapy Documentation Precautions:  Precautions Precautions: Fall Precaution Comments: ataxic LEs, fall risk Restrictions Weight Bearing Restrictions: No General:   Vital Signs:   Pain:0/10 Pain Assessment Pain Scale: 0-10 Pain Score: 6  Pain Type: Acute pain Pain Location: Foot Pain Orientation: Left Pain Descriptors / Indicators: Aching Pain Frequency: Intermittent Pain Onset: On-going Patients Stated Pain Goal: 2 Pain Intervention(s): Medication (See eMAR) (oxycodone given) Mobility: Bed Mobility Bed Mobility: Rolling Right;Rolling Left;Supine to Sit;Sit to Supine Rolling Right: Independent Rolling Left: Independent Supine to Sit: Independent Sit to Supine: Independent Transfers Transfers: Sit to Stand;Stand Occupational hygienist  via Geophysicist/field seismologist Sit to Stand: Independent with assistive device Stand Pivot Transfers: Independent with assistive device Squat Pivot Transfers: Independent with assistive device Transfer (Assistive device): Rolling walker Locomotion : Gait Ambulation: Yes Gait Assistance: Supervision/Verbal cueing Gait Distance (Feet): 200 Feet Assistive device: Rolling walker Gait Assistance Details: Verbal cues for technique;Verbal cues for gait pattern;Verbal cues for safe use of DME/AE Gait Gait: Yes Gait Pattern: Impaired Gait Pattern: Decreased step length - right;Decreased dorsiflexion - left;Scissoring;Ataxic Gait velocity: decreased Stairs / Additional Locomotion Stairs: Yes Architect: Yes Wheelchair Assistance: Independent with Camera operator: Both upper extremities Wheelchair Parts Management: Independent Distance: 300  Trunk/Postural Assessment : Cervical Assessment Cervical Assessment: Within Functional Limits Thoracic Assessment Thoracic Assessment: Within Functional Limits Lumbar Assessment Lumbar Assessment: Exceptions to [facility] (posterior pelvic tilt) Postural Control Postural Control: Deficits on evaluation Righting Reactions: delayed  Balance: Balance Balance Assessed: Yes Static Sitting Balance Static Sitting - Balance Support: No upper extremity supported;Feet supported Static Sitting - Level of Assistance: 7: Independent Dynamic Sitting Balance Dynamic Sitting - Balance Support: No upper extremity supported;Feet supported;During functional activity Dynamic Sitting - Level of Assistance: 6: Modified independent (Device/Increase time) Static Standing Balance Static Standing - Balance Support: Bilateral upper extremity supported;During functional activity Static Standing - Level of Assistance: 6: Modified independent (Device/Increase time) Dynamic Standing Balance Dynamic Standing - Balance Support: Bilateral  upper extremity supported;During functional activity Dynamic Standing - Level of Assistance: 5: Stand by assistance Exercises:   Other Treatments:      Therapy/Group: Individual Therapy  Ladoris Gene 01/11/2021, 10:32 AM

## 2021-01-12 DIAGNOSIS — D649 Anemia, unspecified: Secondary | ICD-10-CM

## 2021-01-12 DIAGNOSIS — D696 Thrombocytopenia, unspecified: Secondary | ICD-10-CM

## 2021-01-12 DIAGNOSIS — I10 Essential (primary) hypertension: Secondary | ICD-10-CM

## 2021-01-12 MED ORDER — VITAMIN B-12 1000 MCG PO TABS: 1000.0000 ug | ORAL_TABLET | Freq: Every day | ORAL | 1 refills | Status: DC

## 2021-01-12 MED ORDER — OXYCODONE-ACETAMINOPHEN 5-325 MG PO TABS: 1.0000 | ORAL_TABLET | Freq: Four times a day (QID) | ORAL | 0 refills | Status: DC | PRN

## 2021-01-12 MED ORDER — ENOXAPARIN SODIUM 40 MG/0.4ML IJ SOSY: 40.0000 mg | PREFILLED_SYRINGE | Freq: Every day | INTRAMUSCULAR | 0 refills | Status: DC

## 2021-01-12 MED ORDER — AMLODIPINE BESYLATE 5 MG PO TABS: 7.5000 mg | ORAL_TABLET | Freq: Every day | ORAL | 0 refills | Status: DC

## 2021-01-12 MED ORDER — ACETAMINOPHEN 325 MG PO TABS: 325.0000 mg | ORAL_TABLET | ORAL | Status: DC | PRN

## 2021-01-12 MED ORDER — ONDANSETRON HCL 4 MG PO TABS: 8.0000 mg | ORAL_TABLET | Freq: Three times a day (TID) | ORAL | 0 refills | Status: DC | PRN

## 2021-01-12 MED ORDER — POLYETHYLENE GLYCOL 3350 17 G PO PACK: 17.0000 g | PACK | Freq: Two times a day (BID) | ORAL | 0 refills | Status: DC

## 2021-01-12 MED ORDER — DEXAMETHASONE 2 MG PO TABS: 2.0000 mg | ORAL_TABLET | Freq: Every day | ORAL | 0 refills | Status: DC

## 2021-01-12 MED ORDER — BENZONATATE 100 MG PO CAPS: 100.0000 mg | ORAL_CAPSULE | Freq: Three times a day (TID) | ORAL | 0 refills | Status: DC | PRN

## 2021-01-12 NOTE — Progress Notes (Signed)
Inpatient Rehabilitation Care Coordinator Discharge Note  The overall goal for the admission was met for:   Discharge location: Discharging to his daughter's home.   Length of Stay: 22 days.   Discharge activity level: Supervision.   Home/community participation: Limited.   Services provided included: MD, RD, PT, OT, SLP, RN, CM, TR, Pharmacy, Neuropsych, and SW  Financial Services: Private Insurance: [employer] Medicare and Medicaid  Choices offered to/list presented to:Yes  Follow-up services arranged: Outpatient: [facility] for PT/OT and DME: [facility] for RW and TTB  Comments (or additional information):  Patient/Family verbalized understanding of follow-up arrangements: Yes  Individual responsible for coordination of the follow-up plan: contact pt #[phone]  Confirmed correct DME delivered:  A  01/12/2021     A  

## 2021-01-12 NOTE — Progress Notes (Signed)
PROGRESS NOTE   Subjective/Complaints:  Pt reports no issues- ready for d/c today-  We plan on sending home on prn pain meds- no scheduled pain meds-  Will arrange for f/u with me as hospital f/u.    ROS:  Pt denies SOB, abd pain, CP, N/V/C/D, and vision changes  Objective:   No results found. No results for input(s): WBC, HGB, HCT, PLT in the last 72 hours.  No results for input(s): NA, K, CL, CO2, GLUCOSE, BUN, CREATININE, CALCIUM in the last 72 hours.   Intake/Output Summary (Last 24 hours) at 01/12/2021 0826 Last data filed at 01/12/2021 0700 Gross per 24 hour  Intake 720 ml  Output 675 ml  Net 45 ml        Physical Exam: Vital Signs Blood pressure 118/88, pulse 69, temperature 98.3 F (36.8 C), resp. rate 17, weight 89.7 kg, SpO2 100 %.     General: awake, alert, appropriate, sitting up in w/c; nurse in room; NAD HENT: conjugate gaze; oropharynx moist CV: regular rate; no JVD Pulmonary: CTA B/L; no W/R/R- good air movement GI: soft, NT, ND, (+)BS Psychiatric: appropriate- still flat in spite of going home today Neurological: alert  Musculoskeletal:        General: No swelling.     Cervical back: Normal range of motion and neck supple.     Comments: Pt's strength in UEs 5/5 B/L in biceps, triceps, WE, grip and finger abd B/L LEs: RLE- HF 4-/5, KE 4-/5, DF 4+/5, PF 4-/5 LLE: HF 3/5, KE 4-/5, DF 3+/5, and PF 4-/5 - LLE very slightly better- but not quite up on points.  Skin:has some new bruising the top/base of 2nd-5th toes on R foot- brownish purple- no trauma - intact pulse L>R LE edema to ankles 1-2+    General: Skin is warm and dry. Skin looks great except has radiation mark as well as fungal toenails/very thickened   LE ankle/foot 1-2+ edema B/L  Neurological:     Mental Status: He is alert and oriented to person, place, and time.     Sensory: Sensory deficit present.     Motor: Weakness  present.     Comments: Speech clear. Sensory deficits from waist down with paraplegia.  Sensation decreased a lot from T7 on down to S5 B/L still decreased.- no change- still sensory level     Assessment/Plan: 1. Functional deficits which require 3+ hours per day of interdisciplinary therapy in a comprehensive inpatient rehab setting. Physiatrist is providing close team supervision and 24 hour management of active medical problems listed below. Physiatrist and rehab team continue to assess barriers to discharge/monitor patient progress toward functional and medical goals  Care Tool:  Bathing    Body parts bathed by patient: Right arm, Left arm, Chest, Abdomen, Front perineal area, Buttocks, Right upper leg, Left upper leg, Right lower leg, Left lower leg, Face         Bathing assist Assist Level: Set up assist     Upper Body Dressing/Undressing Upper body dressing   What is the patient wearing?: Pull over shirt    Upper body assist Assist Level: Independent    Lower Body Dressing/Undressing Lower  body dressing      What is the patient wearing?: Underwear/pull up, Pants     Lower body assist Assist for lower body dressing: Set up assist     Toileting Toileting    Toileting assist Assist for toileting: Independent with assistive device     Transfers Chair/bed transfer  Transfers assist     Chair/bed transfer assist level: Independent with assistive device Chair/bed transfer assistive device: Programmer, multimedia   Ambulation assist      Assist level: Supervision/Verbal cueing Assistive device: Walker-rolling Max distance: 300'   Walk 10 feet activity   Assist     Assist level: Supervision/Verbal cueing Assistive device: Walker-rolling   Walk 50 feet activity   Assist Walk 50 feet with 2 turns activity did not occur: Safety/medical concerns  Assist level: Supervision/Verbal cueing Assistive device: Walker-rolling    Walk 150  feet activity   Assist Walk 150 feet activity did not occur: Safety/medical concerns  Assist level: Supervision/Verbal cueing Assistive device: Walker-rolling    Walk 10 feet on uneven surface  activity   Assist Walk 10 feet on uneven surfaces activity did not occur: Safety/medical concerns   Assist level: Supervision/Verbal cueing Assistive device: Aeronautical engineer Will patient use wheelchair at discharge?: No Type of Wheelchair: Manual    Wheelchair assist level: Independent Max wheelchair distance: 250    Wheelchair 50 feet with 2 turns activity    Assist        Assist Level: Independent   Wheelchair 150 feet activity     Assist      Assist Level: Independent   Blood pressure 118/88, pulse 69, temperature 98.3 F (36.8 C), resp. rate 17, weight 89.7 kg, SpO2 100 %.  Medical Problem List and Plan: 1.  T6 paraplegia ASIA D secondary to mets to Spinal cord/tumors compressing [place]/prostate CA             -patient may  shower             -ELOS/Goals:10-14 days- goals supervision  Continue PT and OT- Radiation to actually finish Monday- they changed it.    con't PT and OT- asking if will walk "again"- explained we cannot tell, but he's walking some with RW- which is better than expected.   D/c today with f/u with me in clinic- hospital f/u.  2.  Antithrombotics: -DVT/anticoagulation:  Pharmaceutical: Lovenox  -will need for at least 2 months due to [place], no matter how far he's walking 6/15- will teach pt/family about Lovenox starting tomorrow- will place order.  6/20- will go home on Lovenox- for a total of  2 months 6/21- is trained how to give  Lovenox and is ready to do so at home. 6/23- home for a total of 2 months from day of Rehab admission.               -antiplatelet therapy: N/a 3. Pain Management: MS contin bid with oxycodone prn. Continue and change as required 6/14- will stop QHS Ms Contin- if has pain, has  prns- if has electrical sensations again, might need nerve pain meds.   6/1hasn't had more electrical pain, just aching/throbbing- con't prn pain meds 6/20- pain controlled- off meds- con't regimen-will send home on a few prns.   6/21- going to get more treatment after leaves- will give 1 month supply prns at d/c.   6/23- d/c with 1 month of pain meds and I or  Oncology can refill.  4. Mood: LCSW to follow for evaluation and support.              -antipsychotic agents: N/a 5. Neuropsych: This patient is capable of making decisions on his own behalf. 6. Skin/Wound Care: Routine pressure relief measures.  7. Fluids/Electrolytes/Nutrition: Monitor I/O. Check lytes in am.  8. HTN: Monitor BP tid--continue Norvasc.              --Resume Cozaar if BP continues to be labile    6/1- BP 139/79 this AM- overall borderline high- will wait to restart Cozaar- Of note, HR is 57 this AM  6/2- will restart Cozaar- 25 mg daily- will restart since BP is still elevated in 150s.  6/21- BP controlled and swelling in legs better- con't regimen 6/22- BP controlled- still has 1-2+ ankle/foot edema, will let PCPmanage BP.  Vitals:   01/11/21 1930 01/12/21 0426  BP: 110/72 118/88  Pulse: 72 69  Resp: 17 17  Temp: 98.6 F (37 C) 98.3 F (36.8 C)  SpO2: 97% 100%    9. Metastatic prostate cancer to spine: XRT ongoing             --slow steroid taper--4 mg tid X 3 days followed by bid X 7 days-->daily X 7 days, then 2 mg daily X 7d days.   10. At risk for spasticity and neurogenic bowel and bladder- will scan bladder to make sure emptying and monitor bowels.    6/1- Pt reports small BM overnight- con't to monitor for urinary retention- as well as con't bowel meds for constipation.   6/3- LBM overnight- con't meds  6/4-6/5: continue laxatives  6/6- refusing laxatives, but still bleeding hemorrhoids con't to encourage usage.   6/7- LBM early this AM- con't regimen  6/11- refusing laxatives- con't regimen 11. L  foot drop  6/1- will make sure has PRAFO ordered for L and wear at night.  12. Hyperkalemia  6/2- will give dose of Lokelma 10G x1- and will recheck in AM  6/3- K+ down to 5.0- will recheck Monday- might need Lokelma more frequently.   6/6- K+ 4.6 today-con't to monitor  6/9- K+ 4.7 today- will recheck Monday.   6/11- will recheck labs in AM 6/14- K+ 4.9- will recheck Thursday  6/16- still K+ 4.9-  6/20- K+ down to 4.1- doing well- con't to monitor 13. Hemorrhoids  6/3- will order Tucks and Anusol suppository since bleeding with BMs.     6/6- refusing miralax as well as anusol suppository even though he's still bleeding. 14. Depressed mood/flat  6/8- will start Celexa 10 mg daily and monitor for side effects.   6/9- pt refusing Celexa- will stop.  15. Azotemia  6/9- pt's Cr up to 1.40 from 1.18- will push fluids- and recheck Monday.   6/10- encouraged pt to drink 6-8 cups /day -admits was drinking 2-3 cups/day- will recheck Monday.   6/15- will recheck Monday since Cr edging upwards  6/16- will start IVFs 75cc hour for 18 hours- so misses therapy and recheck BMP in AM.   6/20- better Cr 1.28- con't regimen  6/23- con't to push fluids for kidney health 16. Bruising of R foot  6/11- will check platelet level- was last clumping- be that was low- could be cause- will check in AM  6/14- Plts 90k this AM- was 114k yesterday- will recheck Thursday.   6/16- plts 91k- likely why bruising more- con't regimen  6/21- Plts up slight to 97k  17.  Cough - non productive, no SOB, afebrile, WBC normal , will tx symptomatically at this point , Recent CT chest owed mild centrilobular emphysema no other lung abnormalities  18. Dispo  6/23- will f/u with me- hospital f/u and send with pain meds prn since Oncology pt.   LOS: 23 days A FACE TO FACE EVALUATION WAS PERFORMED  Terry Mitchell 01/12/2021, 8:26 AM

## 2021-01-12 NOTE — Discharge Summary (Signed)
Physician Discharge Summary  Patient ID: Terry Mitchell MRN: [id number] DOB/AGE: 02-14-1951 70 y.o.  Admit date: 12/20/2020 Discharge date: 01/12/2021  Discharge Diagnoses:  Principal Problem:   Paraplegia [facility]) Active Problems:   Prostate cancer metastatic to bone [facility])   Stage 3b chronic kidney disease ([place])   Cord compression ([facility])   High blood pressure   Thrombocytopenia ([facility])   Discharged Condition: good  Significant Diagnostic Studies: No results found.  Labs:  Basic Metabolic Panel: BMP Latest Ref Rng & Units 01/09/2021 01/06/2021 01/05/2021  Glucose 70 - 99 mg/dL 93 100(H) 104(H)  BUN 8 - 23 mg/dL 30(H) 29(H) 30(H)  Creatinine 0.61 - 1.24 mg/dL 1.28(H) 1.14 1.34(H)  BUN/Creat Ratio 10 - 24 - - -  Sodium 135 - 145 mmol/L 136 136 135  Potassium 3.5 - 5.1 mmol/L 4.1 4.8 4.9  Chloride 98 - 111 mmol/L 101 102 101  CO2 22 - 32 mmol/L 27 29 29   Calcium 8.9 - 10.3 mg/dL 8.9 8.7(L) 8.5(L)     CBC: CBC Latest Ref Rng & Units 01/09/2021 01/05/2021 01/02/2021  WBC 4.0 - 10.5 K/uL 3.6(L) 5.4 8.4  Hemoglobin 13.0 - 17.0 g/dL 10.8(L) 11.0(L) 11.8(L)  Hematocrit 39.0 - 52.0 % 32.9(L) 34.0(L) 35.3(L)  Platelets 150 - 400 K/uL 97(L) 91(L) 90(L)     CBG: No results for input(s): GLUCAP in the last 168 hours.  Brief HPI:   Terry Mitchell is a 70 y.o. male with history of depression, HTN, prostate cancer off treatment and followed by hospice; who was admitted on 12/12/2020 with reports of back pain for 3 to 4 months and BLE weakness and progressive numbness from abdomen down with inability to walk.  He was found to have circumferential tumor at T7 with cord compression and diffuse osseous metastatic disease and largest lesion noted at his 32.  Dr. Cari Caraway was consulted for input and recommended high-dose steroids as well as input from radiation oncology and heme-onc.  He was evaluated by Dr. Lisbeth Renshaw and started on palliative XRT immediately.  Dr. Irene Limbo consulted for input and work-up  showed an enlarging rib lesions without significant abnormality.  Patient revoked hospice services and elected on full scope of care.  He continues on high-dose steroids with recommendations for slow wean.  Therapy ongoing and patient continue be limited by BLE weakness with balance deficits, CIR was recommended to functional decline.   Hospital Course: Terry Mitchell was admitted to rehab 12/20/2020 for inpatient therapies to consist of PT and OT at least three hours five days a week. Past admission physiatrist, therapy team and rehab RN have worked together to provide customized collaborative inpatient rehab.  Patient was found to have paraplegia with decreased sensation from T7 down to S5 bilaterally.  He was maintained on subcu Lovenox for DVT prophylaxis and is to continue this for 5 additional week to complete 2 months of DVT prophylaxis course.  He completed his XRT treatments on 06/06.  He was started on slow steroid taper and continues on this with completion in the next 5 days.  His blood pressures were monitored on TID basis and medications have been titrated upwards for tighter control.  Cozaar was briefly resumed however he developed acute on chronic renal failure therefore this was discontinued.  Amlodipine was increased to 7.5 mg/day and with tapering of steroids his blood pressure control has improved.    Serial check of electrolytes shows renal status to be relatively stable and hyperglycemia is resolving.  CBC shows H&H  to be relatively stable and thrombocytopenia is relatively stable without signs/symptoms of bleeding. He was started on bowel program to help manage constipation.  He did have a flareup of hemorrhoids that were treated locally with improvement.  Constipation has resolved and he is currently using MiraLAX on as needed basis.  Pain control has improved and he was weaned off of MS Contin and is currently using Percocet on as needed basis.  He is continent of bowel and bladder.   He has made some steady gains and is currently ambulating up to 150 feet with supervision.  He will continue to receive follow-up outpatient PT at [facility] outpatient rehab after discharge.   Rehab course: During patient's stay in rehab weekly team conferences were held to monitor patient's progress, set goals and discuss barriers to discharge. At admission, patient required mod assist with basic ADL task and mod to max assist with mobility. He  has had improvement in activity tolerance, balance, postural control as well as ability to compensate for deficits.  He is able to complete upper body dressing tasks at modified independent level and requires set up to supervision for all B ADLs. He is modified independent for transfers and is able to ambulate 150 feet with use of rolling walker.  Family education was completed with daughter regarding need for supervision for safety.  Disposition: Home  Diet: Regular  Special Instructions: 1.  Recommend repeating CBC weekly to monitor platelets while on Lovenox. 2.  No driving or strenuous activity till cleared by MD.   Discharge Instructions     Ambulatory referral to Occupational Therapy   Complete by: As directed    Eval and treat   Ambulatory referral to Physical Medicine Rehab   Complete by: As directed    Office will call you with follow up appointment   Ambulatory referral to Physical Therapy   Complete by: As directed    Eval and treat      Allergies as of 01/12/2021       Reactions   Penicillins Hives, Other (See Comments)   Hives, welts, swelling profusely . Pretty severe reaction Has patient had a PCN reaction causing immediate rash, facial/tongue/throat swelling, SOB or lightheadedness with hypotension: No Has patient had a PCN reaction causing severe rash involving mucus membranes or skin necrosis: Yes Has patient had a PCN reaction that required hospitalization: Yes Has patient had a PCN reaction occurring within the last 10  years: No If all of the above answers are "NO", then may proceed with Cephalosporin use.   Lactose Intolerance (gi) Other (See Comments)   Gi upset        Medication List     STOP taking these medications    atorvastatin 40 MG tablet Commonly known as: LIPITOR   loratadine 10 MG tablet Commonly known as: CLARITIN   losartan 50 MG tablet Commonly known as: COZAAR   morphine 15 MG 12 hr tablet Commonly known as: MS CONTIN   oxyCODONE 5 MG immediate release tablet Commonly known as: Oxy IR/ROXICODONE   Oxycodone HCl 10 MG Tabs       TAKE these medications    acetaminophen 325 MG tablet Commonly known as: TYLENOL Take 1-2 tablets (325-650 mg total) by mouth every 4 (four) hours as needed for mild pain. What changed:  how much to take when to take this reasons to take this   albuterol 108 (90 Base) MCG/ACT inhaler Commonly known as: VENTOLIN HFA Inhale 2 puffs into the lungs every  6 (six) hours as needed for wheezing or shortness of breath.   amLODipine 5 MG tablet Commonly known as: NORVASC Take 1.5 tablets (7.5 mg total) by mouth daily. What changed: how much to take   benzonatate 100 MG capsule Commonly known as: TESSALON Take 1 capsule (100 mg total) by mouth 3 (three) times daily as needed for cough.   dexamethasone 2 MG tablet Commonly known as: DECADRON Take 1 tablet (2 mg total) by mouth daily. Start taking on: January 13, 2021 What changed:  medication strength See the new instructions.   enoxaparin 40 MG/0.4ML injection Commonly known as: LOVENOX Inject 0.4 mLs (40 mg total) into the skin daily. Start taking on: January 13, 2021   ondansetron 4 MG tablet Commonly known as: ZOFRAN Take 2 tablets (8 mg total) by mouth every 8 (eight) hours as needed for nausea or vomiting. What changed: medication strength   oxyCODONE-acetaminophen 5-325 MG tablet--Rx# 100 pills Commonly known as: Percocet Take 1-2 tablets by mouth every 6 (six) hours as needed  for severe pain.   polyethylene glycol 17 g packet Commonly known as: MIRALAX / GLYCOLAX Take 17 g by mouth 2 (two) times daily. Notes to patient: Note that you having been refusing this--make sure that you have a good BM daily--can use it once or twice a day as needed especially while taking pain pills.    vitamin B-12 1000 MCG tablet Commonly known as: CYANOCOBALAMIN Take 1 tablet (1,000 mcg total) by mouth daily.        Follow-up Information     Lovorn, Jinny Blossom, MD Follow up.   Specialty: Physical Medicine and Rehabilitation Why: office will call you with follow up appointment Contact information: [address] [place] [address] [phone]         [name], MD. Call.   Specialty: Oncology Why: for follow  up Contact information: [place] [address] [phone]         Kyung Rudd, MD Follow up.   Specialty: Radiation Oncology Contact information: [address] [place] [address] [phone]         [name], MD. Call.   Specialty: Family Medicine Why: for post hospital follow up Contact information: [place] [address] [phone]         Meade Maw, MD. Call.   Specialty: Neurosurgery Why: for follow up Contact information: [place] [address] [phone]                 Signed: Bary Leriche 01/12/2021, 7:25 PM

## 2021-01-12 NOTE — Progress Notes (Signed)
Recreational Therapy Session Note  Patient Details  Name: Terry Mitchell MRN: [id number] Date of Birth: 12-May-1951 Today's Date: 01/12/2021 LATE ENTRY FOR 6/22 Pain: no c/o Skilled Therapeutic Interventions/Progress Updates: Session focused on discharge planning, use of leisure time, and activity modifications.   Pt is anxious to return home. Pt moving around in his room w/c level with Mod I packing up his belongings in preparation for discharge tomorrow.    [place] 01/12/2021, 8:12 AM

## 2021-01-12 NOTE — Progress Notes (Signed)
Patient discharged home with daughter at 10:56am. Patient educated by Jeannene Patella, PA about discharge instructions. Patient educated on home medications and returned demonstration about Lovenox shots with nurse . Pt Wheeled down to daughter by Nurse tech with all belongings. Patient had no other questions at time of discharge.

## 2021-01-13 ENCOUNTER — Inpatient Hospital Stay: Payer: Medicare HMO | Admitting: Oncology

## 2021-01-16 ENCOUNTER — Inpatient Hospital Stay: Payer: Medicare HMO | Admitting: Oncology

## 2021-01-24 ENCOUNTER — Inpatient Hospital Stay: Payer: Medicare HMO | Attending: Oncology | Admitting: Oncology

## 2021-01-24 ENCOUNTER — Encounter: Payer: Self-pay | Admitting: Oncology

## 2021-01-24 VITALS — BP 117/76 | HR 60 | Temp 98.8°F | Resp 17 | Wt 195.0 lb

## 2021-01-24 DIAGNOSIS — C7951 Secondary malignant neoplasm of bone: Secondary | ICD-10-CM | POA: Insufficient documentation

## 2021-01-24 DIAGNOSIS — C61 Malignant neoplasm of prostate: Secondary | ICD-10-CM | POA: Diagnosis present

## 2021-01-24 DIAGNOSIS — R531 Weakness: Secondary | ICD-10-CM | POA: Insufficient documentation

## 2021-01-24 DIAGNOSIS — G9529 Other cord compression: Secondary | ICD-10-CM | POA: Diagnosis not present

## 2021-01-24 DIAGNOSIS — Z7189 Other specified counseling: Secondary | ICD-10-CM | POA: Diagnosis not present

## 2021-01-24 DIAGNOSIS — Z87891 Personal history of nicotine dependence: Secondary | ICD-10-CM | POA: Insufficient documentation

## 2021-01-24 DIAGNOSIS — R200 Anesthesia of skin: Secondary | ICD-10-CM | POA: Insufficient documentation

## 2021-01-24 NOTE — Progress Notes (Signed)
Hematology/Oncology Follow up note [facility] Telephone:([phone]) [phone] Fax:(336) [phone]   Patient Care Team: [name], MD as PCP - General (Family Medicine) [name], MD as Consulting Physician (Hematology and Oncology)  REASON FOR VISIT:  Follow up for prostate cancer.  HISTORY OF PRESENTING ILLNESS:  Terry Mitchell is a  70 y.o.  male with PMH listed below who was referred to me for evaluation of elevated PSA. Patient was referred by primary care physician to urology due to elevated PSA at the level of 72.  Per note Patient was referred to urology for further management.  Staging imaging has been ordered including abdominal and pelvis CT with contrast and bone scan.  Patient was also given Mills Koller for treatment of clinical prostate cancer.  Patient reports that he was scheduled to return to urologist office for biopsy. Patient denies any dysuria, hematuria, fever or chills.  He lives by himself. Denies any bone pain.  Reports some soreness at the area of DeRidder shots, as well as hot flushes.  Otherwise no complaints.  #01/01/2018 Patient received Firmagon loading dose at urologist office  # patient had a prostate biopsy on 02/28/2018 pathology showed prostate adenocarcinoma with androgen deprivation treatment effect.  Adenocarcinoma is present in all core fragments with involvement of 50% or more of each core.  Perineural invasion is present. As patient has received Firmagon prior to biopsy, Gleason grade cannot be reliably assessed after androgen deprivation therapy.  #  discussed with patient's urologist Dr. Bernardo Heater who did patient's prostate biopsy.  He agrees that given Mills Koller prior to biopsy and staging images are not typical.  He agrees that PET scan is consistent with lymph node involvement.  He does not think patient is a good candidate for radical prostatectomy.  He agrees with me about the plan that definitive radiation is a good option.  #  September 2019  external Beam radiation. He was scheduled to have I-125 interstitial implant although that can not been done so patient underwent external beam IMRT prostate boost, finished in January 2020.  # patient was on androgen deprivation therapy with Firmagon injections, until August 2020. Declined ADT due to vasomotor symptoms.   #Blood work from 04/14/2019 showed testosterone level of 284, PSA has been decreased to 24.18.  # 04/28/2019 CT chest abdomen pelvis and bone scan images were independently reviewed by me and discussed with patient. Images are consistent with metastatic prostate cancer.  # 09/24/2019 colonoscopy.  Descending colon polyp, resected.  Pathology showed tubular adenoma.  Negative for high-grade dysplasia or malignancy. # # 09/25/2019 Endoscopy  # 1023/2021- 10/05/2019  6 cycles of Docetaxel treatments # 01/06/20 US renal showed soft tissue lesion within the posterior wall of bladder suspicious for underlying mass.  I called patient at that time and he declined urology referral or additional work up and hang up.   #04/08/2020, patient kept his appointment and was seen by me.  Patient declined additional urology work-up or imaging work-up.  He tried Xtandi for 2 -3 weeks and self stopped due to side effects.     INTERVAL HISTORY Terry Mitchell is a 70 y.o. male who has above history reviewed by me today presents for follow-up visit for management of Stage IVA (cTX, cN1, cM1) metastatic prostate cancer He was seen by me in Oct 2021 and opted not to do any treatment and pursue with hospice.   12/11/2020 - 12/20/2020 hospitalized due to spinal cord compression due to circumferential epidural extension of  tumor at T6-T7, metastatic prostate cancer with diffuse osseous metastatic disease.  Neurosugery was consulted.Given the lack of systemic disease, no surgical intervention was recommended and patient was started on steroid and received palliative RT to his spine, finished  12/26/20.  Patient went to SNF for PT and OT.   He missed his post hospitalization follow up appointment in June 2022. Today he presents to discuss possible treatment options. He has lower body numbness and bilateral lower extremity weakness. He is only able to walk with a walker for very short period of time.   Review of Systems  Constitutional:  Positive for fatigue. Negative for appetite change, chills, fever and unexpected weight change.  HENT:   Negative for hearing loss and voice change.   Eyes:  Negative for eye problems and icterus.  Respiratory:  Negative for chest tightness, cough and shortness of breath.   Cardiovascular:  Negative for chest pain and leg swelling.  Gastrointestinal:  Negative for abdominal distention, abdominal pain and blood in stool.  Endocrine: Negative for hot flashes.  Genitourinary:  Negative for difficulty urinating, dysuria and frequency.   Musculoskeletal:  Negative for arthralgias.  Skin:  Negative for itching and rash.  Neurological:  Positive for numbness. Negative for extremity weakness and light-headedness.       Lower extremity numbness.   Hematological:  Negative for adenopathy. Does not bruise/bleed easily.  Psychiatric/Behavioral:  Negative for confusion.     MEDICAL HISTORY:  Past Medical History:  Diagnosis Date   Depression    recently went on disability d/t diagnosis   History of recent fall 01/2018   missed the last step on ladder, causing back discomfort   Hypertension    Prostate cancer ([place]) 12/2017   prostate    SURGICAL HISTORY: Past Surgical History:  Procedure Laterality Date   BACK SURGERY  2001    2 rods and 6 screws in back   COLONOSCOPY WITH PROPOFOL N/A 09/24/2019   Procedure: COLONOSCOPY WITH PROPOFOL;  Surgeon: Jonathon Bellows, MD;  Location: [facility] ENDOSCOPY;  Service: Gastroenterology;  Laterality: N/A;   Left shoulder surgery Left 1998   removed a piece of bone around collar bone   PROSTATE BIOPSY N/A 02/28/2018    Procedure: PROSTATE BIOPSY;  Surgeon: Abbie Sons, MD;  Location: [facility] ORS;  Service: Urology;  Laterality: N/A;   TRANSRECTAL ULTRASOUND N/A 02/28/2018   Procedure: TRANSRECTAL ULTRASOUND;  Surgeon: Abbie Sons, MD;  Location: [facility] ORS;  Service: Urology;  Laterality: N/A;    SOCIAL HISTORY: Social History   Socioeconomic History   Marital status: Single    Spouse name: Not on file   Number of children: 4   Years of education: Not on file   Highest education level: Not on file  Occupational History   Occupation: disability  Tobacco Use   Smoking status: Former    Packs/day: 2.00    Pack years: 0.00    Types: Cigars, Cigarettes    Quit date: 07/24/2019    Years since quitting: 1.5   Smokeless tobacco: Never   Tobacco comments:    2 cigars  Vaping Use   Vaping Use: Never used  Substance and Sexual Activity   Alcohol use: Yes    Alcohol/week: 9.0 standard drinks    Types: 9 Cans of beer per week    Comment: occasional   Drug use: Yes    Types: Cocaine, "Crack" cocaine    Comment: none within the year   Sexual activity: Not Currently  Other Topics Concern   Not on file  Social History Narrative   Not on file   Social Determinants of Health   Financial Resource Strain: Not on file  Food Insecurity: Not on file  Transportation Needs: Not on file  Physical Activity: Not on file  Stress: Not on file  Social Connections: Not on file  Intimate Partner Violence: Not on file    FAMILY HISTORY: Family History  Problem Relation Age of Onset   Stroke Mother    Kidney failure Mother    Diabetes Maternal Aunt     ALLERGIES:  is allergic to penicillins and lactose intolerance (gi).  MEDICATIONS:  Current Outpatient Medications  Medication Sig Dispense Refill   acetaminophen (TYLENOL) 325 MG tablet Take 1-2 tablets (325-650 mg total) by mouth every 4 (four) hours as needed for mild pain.     amLODipine (NORVASC) 5 MG tablet Take 1.5 tablets (7.5 mg total) by  mouth daily. 45 tablet 0   benzonatate (TESSALON) 100 MG capsule Take 1 capsule (100 mg total) by mouth 3 (three) times daily as needed for cough. 20 capsule 0   dexamethasone (DECADRON) 2 MG tablet Take 1 tablet (2 mg total) by mouth daily. 4 tablet 0   enoxaparin (LOVENOX) 40 MG/0.4ML injection Inject 0.4 mLs (40 mg total) into the skin daily. 13.4 mL 0   ondansetron (ZOFRAN) 4 MG tablet Take 2 tablets (8 mg total) by mouth every 8 (eight) hours as needed for nausea or vomiting. 30 tablet 0   oxyCODONE-acetaminophen (PERCOCET) 5-325 MG tablet Take 1-2 tablets by mouth every 6 (six) hours as needed for severe pain. 100 tablet 0   polyethylene glycol (MIRALAX / GLYCOLAX) 17 g packet Take 17 g by mouth 2 (two) times daily. 60 each 0   vitamin B-12 (CYANOCOBALAMIN) 1000 MCG tablet Take 1 tablet (1,000 mcg total) by mouth daily. 30 tablet 1   albuterol (VENTOLIN HFA) 108 (90 Base) MCG/ACT inhaler Inhale 2 puffs into the lungs every 6 (six) hours as needed for wheezing or shortness of breath. (Patient not taking: Reported on 01/24/2021) 6.7 g 0   No current facility-administered medications for this visit.   Facility-Administered Medications Ordered in Other Visits  Medication Dose Route Frequency Provider Last Rate Last Admin   sodium chloride flush (NS) 0.9 % injection 10 mL  10 mL Intravenous Once [name], MD         PHYSICAL EXAMINATION: ECOG PERFORMANCE STATUS: 1 - Symptomatic but completely ambulatory Vitals:   01/24/21 1528  BP: 117/76  Pulse: 60  Resp: 17  Temp: 98.8 F (37.1 C)  SpO2: 95%   Filed Weights   01/24/21 1528  Weight: 195 lb (88.5 kg)    Physical Exam Constitutional:      General: He is not in acute distress.    Comments: He sits in the wheelchair.  HENT:     Head: Normocephalic and atraumatic.  Eyes:     General: No scleral icterus. Pulmonary:     Effort: Pulmonary effort is normal.  Musculoskeletal:        General: No deformity.     Cervical back: Neck  supple.  Skin:    Findings: No erythema or rash.  Neurological:     Mental Status: He is alert.     Cranial Nerves: No cranial nerve deficit.     Coordination: Coordination normal.  Declined physician examination.    LABORATORY DATA:  I have reviewed the data as listed Lab Results  Component Value Date   WBC 3.6 (L) 01/09/2021   HGB 10.8 (L) 01/09/2021   HCT 32.9 (L) 01/09/2021   MCV 101.9 (H) 01/09/2021   PLT 97 (L) 01/09/2021   Recent Labs    04/06/20 0905 05/06/20 1325 12/21/20 0511 12/23/20 0650 12/29/20 0524 01/01/21 0502 01/05/21 0507 01/06/21 0513 01/09/21 0620  NA 140   < > 131*   < > 131*   < > 135 136 136  K 4.2   < > 5.6*   < > 4.7   < > 4.9 4.8 4.1  CL 105   < > 95*   < > 97*   < > 101 102 101  CO2 23   < > 28   < > 26   < > 29 29 27   GLUCOSE 106*   < > 116*   < > 115*   < > 104* 100* 93  BUN 38*   < > 31*   < > 32*   < > 30* 29* 30*  CREATININE 1.63*   < > 1.24   < > 1.40*   < > 1.34* 1.14 1.28*  CALCIUM 9.5   < > 8.8*   < > 8.6*   < > 8.5* 8.7* 8.9  GFRNONAA 43*   < > >60   < > 55*   < > 58* >60 >60  GFRAA 50*  --   --   --   --   --   --   --   --   PROT 7.3   < > 5.2*  --  4.7*  --  5.0*  --   --   ALBUMIN 4.4   < > 2.9*  --  2.7*  --  2.7*  --   --   AST 46*   < > 23  --  22  --  20  --   --   ALT 23   < > 43  --  35  --  40  --   --   ALKPHOS 82   < > 106  --  78  --  98  --   --   BILITOT 0.9   < > 0.3  --  0.5  --  0.7  --   --    < > = values in this interval not displayed.    Iron/TIBC/Ferritin/ %Sat No results found for: IRON, TIBC, FERRITIN, IRONPCTSAT   RADIOGRAPHIC STUDIES: I have personally reviewed the radiological images as listed and agreed with the findings in the report. CT ABDOMEN PELVIS WO CONTRAST  Result Date: 12/10/2020 CLINICAL DATA:  Abdominal distension. EXAM: CT ABDOMEN AND PELVIS WITHOUT CONTRAST TECHNIQUE: Multidetector CT imaging of the abdomen and pelvis was performed following the standard protocol without IV  contrast. COMPARISON:  April 28, 2019. FINDINGS: Lower chest: No acute abnormality. Hepatobiliary: No focal liver abnormality is seen. No gallstones, gallbladder wall thickening, or biliary dilatation. Pancreas: Unremarkable. No pancreatic ductal dilatation or surrounding inflammatory changes. Spleen: Normal in size without focal abnormality. Adrenals/Urinary Tract: Adrenal glands are unremarkable. Kidneys are normal, without renal calculi, focal lesion, or hydronephrosis. Bladder is unremarkable. Stomach/Bowel: The stomach appears normal. There is no evidence of bowel obstruction or inflammation. Vascular/Lymphatic: Aortic atherosclerosis. No enlarged abdominal or pelvic lymph nodes. Reproductive: Stable prostatic calcifications. Other: No abdominal wall hernia or abnormality. No abdominopelvic ascites. Musculoskeletal: Status post surgical posterior fusion of L4-5 and L5-S1 with bilateral intrapedicular screw placement. Increased abnormal sclerotic  density is noted in T12 vertebral body concerning for possible metastatic disease. IMPRESSION: Increased abnormal sclerotic density is noted in T12 vertebral body concerning for possible metastatic disease. No other significant abnormality seen in the abdomen or pelvis. Aortic Atherosclerosis (ICD10-I70.0). Electronically Signed   By: Marijo Conception M.D.   On: 12/10/2020 18:36   CT Head Wo Contrast  Result Date: 12/10/2020 CLINICAL DATA:  Dizziness. EXAM: CT HEAD WITHOUT CONTRAST TECHNIQUE: Contiguous axial images were obtained from the base of the skull through the vertex without intravenous contrast. COMPARISON:  May 30, 2017. FINDINGS: Brain: Old lacunar infarction is noted in right basal ganglia. No mass effect or midline shift is noted. Ventricular size is within normal limits. There is no evidence of mass lesion, hemorrhage or acute infarction. Vascular: No hyperdense vessel or unexpected calcification. Skull: Normal. Negative for fracture or focal  lesion. Sinuses/Orbits: Right sphenoid sinusitis is noted. Other: None. IMPRESSION: No acute intracranial abnormality seen.  Right sphenoid sinusitis. Electronically Signed   By: Marijo Conception M.D.   On: 12/10/2020 18:29   MR Brain W and Wo Contrast  Result Date: 12/11/2020 CLINICAL DATA:  Lower extremity weakness EXAM: MRI HEAD WITHOUT AND WITH CONTRAST TECHNIQUE: Multiplanar, multiecho pulse sequences of the brain and surrounding structures were obtained without and with intravenous contrast. CONTRAST:  7.26mL GADAVIST GADOBUTROL 1 MMOL/ML IV SOLN COMPARISON:  None. FINDINGS: Brain: No acute infarct, mass effect or extra-axial collection. No acute or chronic hemorrhage. There is multifocal hyperintense T2-weighted signal within the white matter. Generalized volume loss without a clear lobar predilection. The midline structures are normal. There is no abnormal contrast enhancement. Old right basal ganglia small vessel infarct. Vascular: Major flow voids are preserved. Skull and upper cervical spine: Normal calvarium and skull base. Visualized upper cervical spine and soft tissues are normal. Sinuses/Orbits:No paranasal sinus fluid levels or advanced mucosal thickening. No mastoid or middle ear effusion. Normal orbits. IMPRESSION: 1. No acute intracranial or intracranial metastatic disease. 2. Multifocal hyperintense T2-weighted signal within the white matter, most consistent with chronic microvascular ischemia. 3. Generalized volume loss without a clear lobar predilection. Electronically Signed   By: Ulyses Jarred M.D.   On: 12/11/2020 01:38   MR THORACIC SPINE W WO CONTRAST  Result Date: 12/11/2020 CLINICAL DATA:  Difficulty walking. EXAM: MRI THORACIC WITHOUT AND WITH CONTRAST TECHNIQUE: Multiplanar and multiecho pulse sequences of the thoracic spine were obtained without and with intravenous contrast. CONTRAST:  7.43mL GADAVIST GADOBUTROL 1 MMOL/ML IV SOLN COMPARISON:  None. FINDINGS: Alignment:  Normal  Vertebrae: There are numerous metastatic lesions throughout the visualized spine. The largest lesion is at T6 where there is circumferential epidural extension of tumor that effaces the thecal sac and compresses the spinal cord. Cord:  No parenchymal abnormality. Paraspinal and other soft tissues: Negative Disc levels: Aside from the epidural abnormality described above, there is no spinal canal or neural foraminal stenosis. IMPRESSION: 1. Diffuse osseous metastatic disease with largest lesion at T6. 2. Circumferential epidural extension of tumor at T6-T7 with compression of the spinal cord. Critical Value/emergent results were called by telephone at the time of interpretation on 12/11/2020 at 1:38 am to provider [facility], who verbally acknowledged these results. Electronically Signed   By: Ulyses Jarred M.D.   On: 12/11/2020 01:39   MR Lumbar Spine W Wo Contrast  Result Date: 12/11/2020 CLINICAL DATA:  Shortness of breath and lower extremity swelling EXAM: MRI LUMBAR SPINE WITHOUT AND WITH CONTRAST TECHNIQUE: Multiplanar and multiecho pulse sequences  of the lumbar spine were obtained without and with intravenous contrast. CONTRAST:  7.65mL GADAVIST GADOBUTROL 1 MMOL/ML IV SOLN COMPARISON:  None. FINDINGS: Segmentation:  Standard Alignment:  Posterior fusion at L4-S1 Vertebrae: Osseous metastases at all levels. The largest lesion is at T12. No pathologic fracture. Conus medullaris and cauda equina: Conus extends to the L1 level. Conus and cauda equina appear normal. Paraspinal and other soft tissues: Negative Disc levels: Limited assessment of the lower lumbar spinal canal due to magnetic susceptibility effects from spinal hardware. No spinal canal or neural foraminal stenosis of the upper lumbar spine. IMPRESSION: 1. Diffuse lumbar osseous metastatic disease without pathologic fracture. 2. No spinal canal or neural foraminal stenosis of the upper lumbar spine. Electronically Signed   By: Ulyses Jarred M.D.    On: 12/11/2020 01:33   CT CHEST ABDOMEN PELVIS WO CONTRAST  Result Date: 12/13/2020 CLINICAL DATA:  Prostate cancer, restaging examination EXAM: CT CHEST, ABDOMEN AND PELVIS WITHOUT CONTRAST TECHNIQUE: Multidetector CT imaging of the chest, abdomen and pelvis was performed following the standard protocol without IV contrast. COMPARISON:  CT abdomen pelvis 12/10/2020, CT chest 09/06/2020 FINDINGS: CT CHEST FINDINGS Cardiovascular: Moderate coronary artery calcification. Global cardiac size within normal limits. No pericardial effusion. Central pulmonary arteries are of normal caliber. Mild atherosclerotic calcification within the thoracic aorta. No aortic aneurysm. Mediastinum/Nodes: There is progressive thoracic adenopathy involving the right paratracheal and prevascular lymph node groups. By example, a high right paratracheal lymph node measures 12 mm in short axis diameter on image # 17/2 (previously measuring 7 mm). A prevascular lymph node measures 9 mm in short axis diameter, image # 25/2 (previously measuring 4 mm). The esophagus is unremarkable. Visualized thyroid is unremarkable. Lungs/Pleura: Mild centrilobular emphysema. Mild bibasilar atelectasis. No suspicious focal pulmonary nodules or infiltrates. No pneumothorax or pleural effusion. Central airways are widely patent. Musculoskeletal: Multiple sclerotic metastases are again identified throughout the visualized axial skeleton. Progressive sclerosis and permeative changes seen involving the anterolateral third rib with associated pathologic fracture now evident. New sclerotic metastasis within the anterolateral fourth rib. New sclerotic foci within the left twelfth, tenth and fourth ribs as well as increasing sclerosis involving the left third rib and sixth rib. New sclerotic metastasis within the tenth vertebral body. Extensive sclerosis with associated paravertebral soft tissue again noted at T6 similar 2 that noted on prior examination. Epidural  extension of the soft tissue component at this level is better appreciated on prior MRI examination of 12/11/2020. CT ABDOMEN PELVIS FINDINGS Hepatobiliary: No focal liver abnormality is seen. No gallstones, gallbladder wall thickening, or biliary dilatation. Pancreas: Unremarkable Spleen: Unremarkable Adrenals/Urinary Tract: Adrenal glands are unremarkable. Kidneys are normal, without renal calculi, focal lesion, or hydronephrosis. Bladder is unremarkable. Stomach/Bowel: Stomach is within normal limits. Appendix appears normal. No evidence of bowel wall thickening, distention, or inflammatory changes. No free intraperitoneal gas or fluid. Vascular/Lymphatic: Mild aortoiliac atherosclerotic calcification. No aortic aneurysm. No pathologic thoracic adenopathy within the abdomen and pelvis. Reproductive: Prostate is unremarkable. Other: No abdominal wall hernia. Musculoskeletal: Lumbar fusion with instrumentation and posterior decompression of L5 again noted. Multiple sclerotic metastases are noted within the lumbar spine and pelvis bilaterally. No pathologic fracture. IMPRESSION: Interval disease progression with progressive sclerotic metastases within the thorax and progressive borderline mediastinal adenopathy. Mixed lytic and sclerotic lesion involving the T6 vertebral body with associated paravertebral soft tissue mass and epidural extension is not significantly changed from prior examination. New minimally displaced pathologic fracture of the a right third rib. Widespread sclerotic metastases  within the lumbar spine and pelvis appears grossly stable. Moderate coronary artery calcification. Mild centrilobular emphysema. Aortic Atherosclerosis (ICD10-I70.0). Electronically Signed   By: Fidela Salisbury MD   On: 12/13/2020 22:28     ASSESSMENT & PLAN:  1. Prostate cancer ([place])   2. Spinal cord compression due to malignant neoplasm metastatic to spine ([place])   3. Goals of care, counseling/discussion   Cancer  Staging Prostate cancer [facility]) Staging form: Prostate, AJCC 8th Edition - Clinical stage from 03/19/2018: Stage IVB (cTX, cN1, cM1b) - Signed by [name], MD on 05/01/2019  #Metastatic prostate Cancer, previously castration sensitive, s/p upfront docetaxel x 6.  Not on ADT and cancer treatment due to his preference.  Now with spinal cord compression, s/p XRT, he has residual neurological deficit- numbness and leg weakness.  I had a lengthy discussion with patient. He understands that his condition is not curable. I recommend him to restart ADT, and consider oral chemotherapy agents which may provide him disease control. He wants to know if additional treatment may improve his neurological deficit. Discussed with him that his neurological deficit may continue to improve with physical therapy and exercise, however, he may continue to have permanent deficit. Systemic treatment may help to control disease, hopefully avoiding other complications and recurrent cord compression.  Patient adamantly declined further treatment since he feels there is no point treating if it is not going to help his lower extremity weakness. I try to discuss with him about hospice care and he is not interested and does not want to further discuss. He asked his family member to roll his wheelchair out of the exam room without finishing our conversation.    Cc Adamo, Hattie Perch, MD  [name], MD, PhD Hematology Oncology [facility] Pager- [phone] 01/24/2021

## 2021-01-24 NOTE — Progress Notes (Signed)
Patient here for oncology follow-up appointment, expresses concerns of leg numbness and constipation with chronic cough. Patient asks how to regain strength in legs

## 2021-01-27 ENCOUNTER — Other Ambulatory Visit: Payer: Self-pay | Admitting: Hospice and Palliative Medicine

## 2021-01-27 DIAGNOSIS — C61 Malignant neoplasm of prostate: Secondary | ICD-10-CM

## 2021-01-27 NOTE — Progress Notes (Signed)
Patient saw Dr. Tasia Catchings this week and refused further workup/treatment. He also refused hospice. Patient was previously followed at home by hospice [facility]) but revoked in May 2022 to pursue further treatment. Will refer to community palliative care. Would recommend hospice if patient is willing.

## 2021-02-01 ENCOUNTER — Other Ambulatory Visit: Payer: Self-pay | Admitting: Hospice and Palliative Medicine

## 2021-02-01 ENCOUNTER — Telehealth: Payer: Self-pay | Admitting: Student

## 2021-02-01 DIAGNOSIS — C61 Malignant neoplasm of prostate: Secondary | ICD-10-CM

## 2021-02-01 NOTE — Telephone Encounter (Signed)
Spoke with patient regarding the Palliative referral, he said that he is currently staying with his daughter in [place], because he had lost his home when he was in the hospital and he is trying to find another place closer to [place].   He wanted me to call him back in a couple of weeks to f/u with him at that time.  I did offer to schedule a visit with our NP to come out to see him at his daughter's home but he declined.  Will f/u in a couple of weeks with patient.

## 2021-02-13 ENCOUNTER — Other Ambulatory Visit: Payer: Self-pay

## 2021-02-13 ENCOUNTER — Ambulatory Visit
Admit: 2021-02-13 | Discharge: 2021-02-13 | Disposition: A | Discharge status: Home / Self Care | Payer: Medicare HMO | Attending: Radiation Oncology | Admitting: Radiation Oncology

## 2021-02-13 DIAGNOSIS — G9520 Unspecified cord compression: Secondary | ICD-10-CM

## 2021-02-13 DIAGNOSIS — C7951 Secondary malignant neoplasm of bone: Secondary | ICD-10-CM

## 2021-02-13 DIAGNOSIS — C61 Malignant neoplasm of prostate: Secondary | ICD-10-CM

## 2021-02-13 NOTE — Progress Notes (Signed)
  Radiation Oncology         [phone] ________________________________  Name: Terry Mitchell MRN: [id number]  Date of Service: 02/13/2021  DOB: Dec 07, 1950  Post Treatment Telephone Note  Diagnosis:   Metastatic prostate cancer to bone  Interval Since Last Radiation:  7 weeks   12/12/2020 through 12/26/2020 Site Technique Total Dose (Gy) Dose per Fx (Gy) Completed Fx Beam Energies  Thoracic Spine: Spine_T6 Complex 27/27 3 9/9 10X, 15X    Narrative:  The patient was contacted today for routine follow-up. During treatment he did very well with radiotherapy and did not have significant desquamation. He reports he is doing well since well since completing treatment and is back walking without difficulty.  Impression/Plan: 1. Metastatic prostate cancer to bone. The patient has been doing well since completion of radiotherapy. We discussed that we would be happy to continue to follow him as needed, but he will also continue to follow up with Dr. Tasia Catchings in medical oncology.      Carola Rhine, PAC

## 2021-02-14 NOTE — Progress Notes (Signed)
Oh goodness. It's definitely a dicey situation, yesterday he was open to follow up.  Thanks, Bryson Ha

## 2021-02-21 ENCOUNTER — Telehealth: Payer: Self-pay

## 2021-02-21 NOTE — Telephone Encounter (Signed)
Spoke with patient and scheduled an in-person Palliative Consult for 03/29/21 @ 12:30PM  COVID screening was negative. One dog in the home will put away before NP arrives. Patient lives with daughter.   Consent obtained; updated Outlook/Netsmart/Team List and Epic.   Family is aware they may be receiving a call from provider the day before or day of to confirm appointment.

## 2021-03-13 ENCOUNTER — Encounter: Payer: Medicare HMO | Attending: Physical Medicine and Rehabilitation | Admitting: Physical Medicine and Rehabilitation

## 2021-03-19 ENCOUNTER — Other Ambulatory Visit: Payer: Self-pay

## 2021-03-19 ENCOUNTER — Emergency Department (HOSPITAL_COMMUNITY): Payer: Medicare HMO

## 2021-03-19 ENCOUNTER — Encounter (HOSPITAL_COMMUNITY): Payer: Self-pay

## 2021-03-19 ENCOUNTER — Emergency Department (HOSPITAL_COMMUNITY)
Admission: EM | Admit: 2021-03-19 | Discharge: 2021-03-19 | Disposition: A | Discharge status: Home / Self Care | Payer: Medicare HMO | Attending: Emergency Medicine | Admitting: Emergency Medicine

## 2021-03-19 DIAGNOSIS — C801 Malignant (primary) neoplasm, unspecified: Secondary | ICD-10-CM | POA: Diagnosis not present

## 2021-03-19 DIAGNOSIS — N1832 Chronic kidney disease, stage 3b: Secondary | ICD-10-CM | POA: Insufficient documentation

## 2021-03-19 DIAGNOSIS — M79602 Pain in left arm: Secondary | ICD-10-CM | POA: Insufficient documentation

## 2021-03-19 DIAGNOSIS — Z8546 Personal history of malignant neoplasm of prostate: Secondary | ICD-10-CM | POA: Diagnosis not present

## 2021-03-19 DIAGNOSIS — C7951 Secondary malignant neoplasm of bone: Secondary | ICD-10-CM | POA: Diagnosis not present

## 2021-03-19 DIAGNOSIS — Z87891 Personal history of nicotine dependence: Secondary | ICD-10-CM | POA: Insufficient documentation

## 2021-03-19 DIAGNOSIS — R079 Chest pain, unspecified: Secondary | ICD-10-CM

## 2021-03-19 DIAGNOSIS — R0789 Other chest pain: Secondary | ICD-10-CM | POA: Diagnosis not present

## 2021-03-19 DIAGNOSIS — I129 Hypertensive chronic kidney disease with stage 1 through stage 4 chronic kidney disease, or unspecified chronic kidney disease: Secondary | ICD-10-CM | POA: Diagnosis not present

## 2021-03-19 LAB — CBC
HCT: 28.8 % — ABNORMAL LOW (ref 39.0–52.0)
Hemoglobin: 9.2 g/dL — ABNORMAL LOW (ref 13.0–17.0)
MCH: 31.5 pg (ref 26.0–34.0)
MCHC: 31.9 g/dL (ref 30.0–36.0)
MCV: 98.6 fL (ref 80.0–100.0)
Platelets: 105 10*3/uL — ABNORMAL LOW (ref 150–400)
RBC: 2.92 MIL/uL — ABNORMAL LOW (ref 4.22–5.81)
RDW: 13.9 % (ref 11.5–15.5)
WBC: 5.4 10*3/uL (ref 4.0–10.5)
nRBC: 0 % (ref 0.0–0.2)

## 2021-03-19 LAB — BASIC METABOLIC PANEL
Anion gap: 9 (ref 5–15)
BUN: 21 mg/dL (ref 8–23)
CO2: 23 mmol/L (ref 22–32)
Calcium: 9.1 mg/dL (ref 8.9–10.3)
Chloride: 100 mmol/L (ref 98–111)
Creatinine, Ser: 1.26 mg/dL — ABNORMAL HIGH (ref 0.61–1.24)
GFR, Estimated: >60 mL/min (ref >60)
Glucose, Bld: 115 mg/dL — ABNORMAL HIGH (ref 70–99)
Potassium: 3.6 mmol/L (ref 3.5–5.1)
Sodium: 132 mmol/L — ABNORMAL LOW (ref 135–145)

## 2021-03-19 LAB — TROPONIN I (HIGH SENSITIVITY)
Troponin I (High Sensitivity): 12 ng/L (ref <18)
Troponin I (High Sensitivity): 14 ng/L (ref <18)

## 2021-03-19 IMAGING — DX DG CHEST 1V
1 series · 1 of 1 positions shown · non-contrast
Comparison: CT chest [DATE] .

CLINICAL DATA: Left chest pain.

EXAM:
CHEST  1 VIEW

[chest ap]
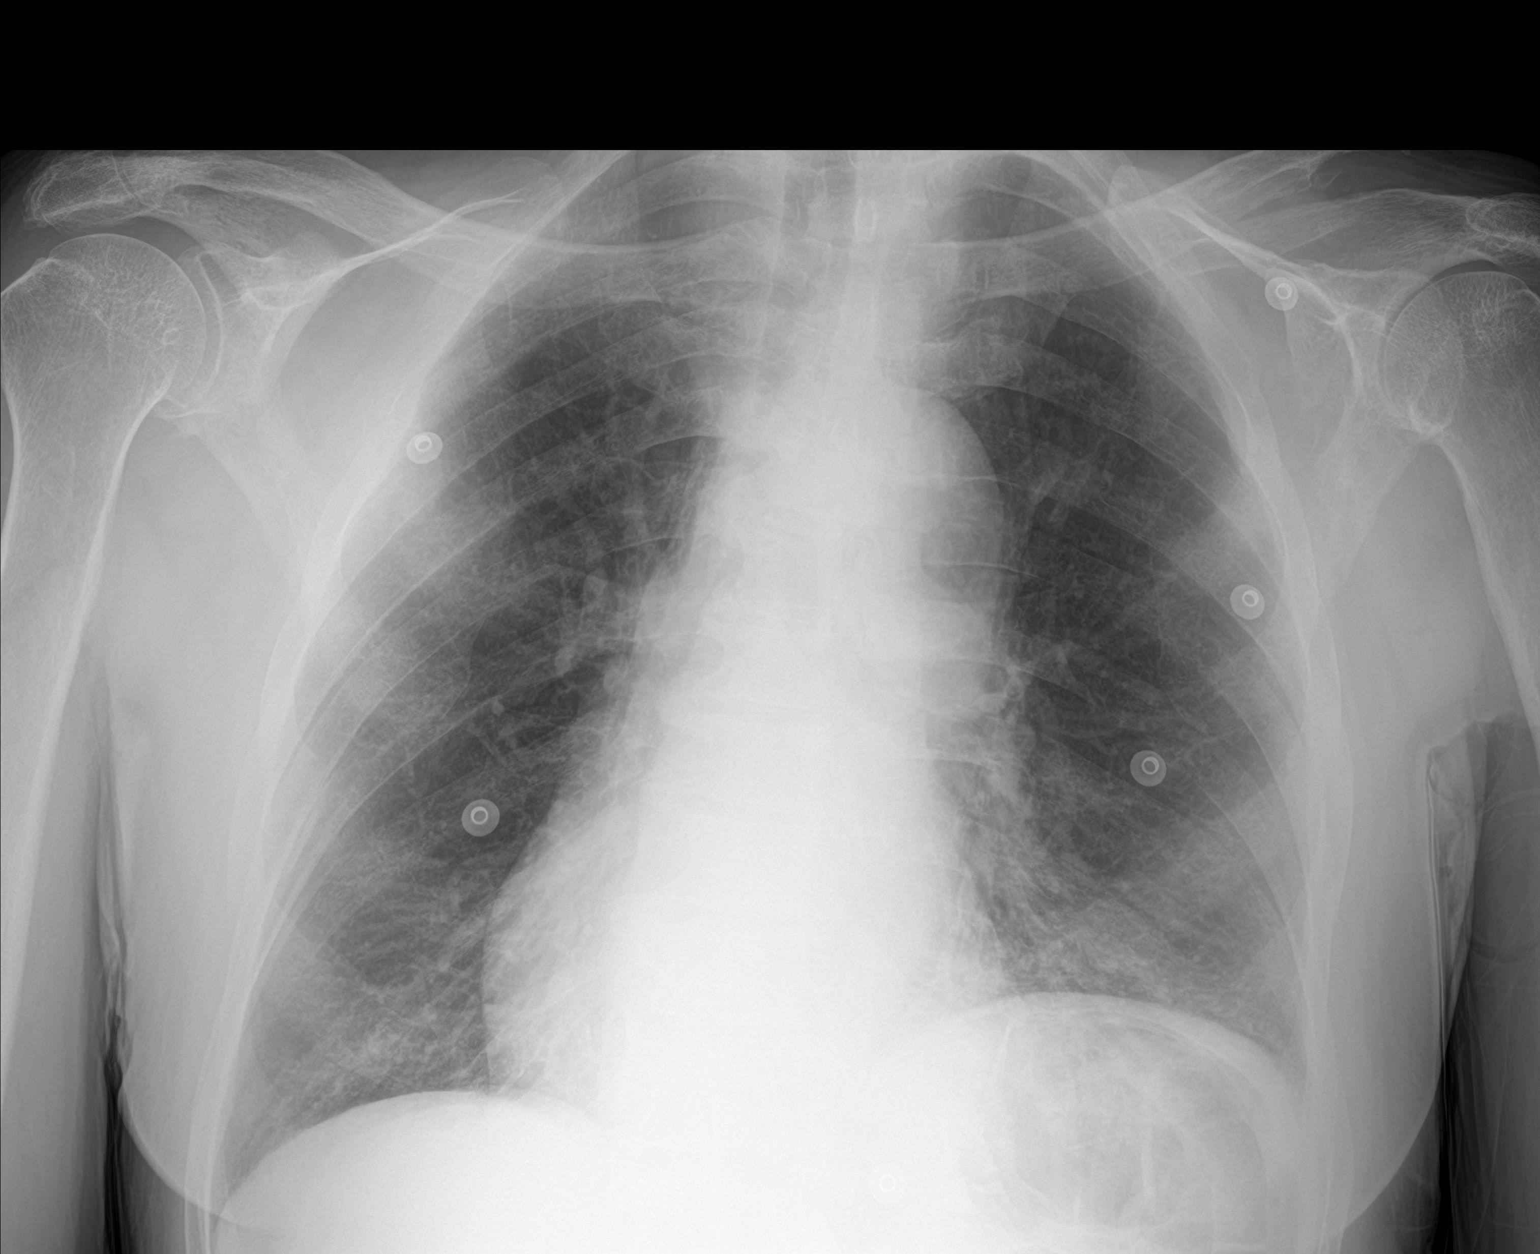

[1 of 1 positions shown; findings below may reference images not displayed]

FINDINGS: Normal heart size. No pleural effusion or edema. No airspace
opacities identified. Multifocal sclerotic rib metastases are
identified bilaterally.
IMPRESSION: No acute cardiopulmonary abnormalities.

Bilateral sclerotic rib metastases.

## 2021-03-19 MED ORDER — DEXAMETHASONE SODIUM PHOSPHATE 10 MG/ML IJ SOLN
8.0000 mg | Freq: Once | INTRAMUSCULAR | Status: AC
  Administered 2021-03-19: 8 mg via INTRAVENOUS
  Filled 2021-03-19: qty 1

## 2021-03-19 MED ORDER — HYDROMORPHONE HCL 1 MG/ML IJ SOLN
1.0000 mg | Freq: Once | INTRAMUSCULAR | Status: AC
  Administered 2021-03-19: 1 mg via INTRAVENOUS
  Filled 2021-03-19: qty 1

## 2021-03-19 MED ORDER — HYDROMORPHONE HCL 2 MG PO TABS: 2.0000 mg | ORAL_TABLET | Freq: Four times a day (QID) | ORAL | 0 refills | Status: DC | PRN

## 2021-03-19 NOTE — ED Provider Notes (Signed)
Emergency Medicine Provider Triage Evaluation Note  Terry Mitchell , a 70 y.o. male  was evaluated in triage.  Pt complains of chest pain onset last night 8 PM, attempted Percocet without relief.  Patient reports pain was severe constant and has only improved since receiving nitroglycerin and aspirin by EMS just prior to arrival.  Additionally patient reports left shoulder pain onset around 1 month ago he has seen his PCP for this.  Patient reports left shoulder pain feels somewhat worse since his chest pain started last night.  Patient reports normally Percocet helps with his left shoulder pain. Review of Systems  Positive: Chest pain, left shoulder pain Negative: Fever, chills, fall, injury, nausea, vomiting, diarrhea, recent illness  Physical Exam  There were no vitals taken for this visit. Gen:   Awake, no distress   Resp:  Normal effort  MSK:   Moves extremities without difficulty  Other:  Heart regular rate and rhythm.  Intact and equal radial and pulses bilaterally.  Lungs clear to auscultation.  Medical Decision Making  Medically screening exam initiated at 10:33 AM.  Appropriate orders placed.  Terry Mitchell was informed that the remainder of the evaluation will be completed by another provider, this initial triage assessment does not replace that evaluation, and the importance of remaining in the ED until their evaluation is complete.  Chest pain, improved following nitro and aspirin by EMS.  Chest pain order set utilized.  Patient will need room in main ED.  Note: Portions of this report may have been transcribed using voice recognition software. Every effort was made to ensure accuracy; however, inadvertent computerized transcription errors may still be present.    Deliah Boston, PA-C 03/19/21 1035    Lorelle Gibbs, DO 03/20/21 1554

## 2021-03-19 NOTE — ED Triage Notes (Signed)
Pt from home with ems c.o chest pain that started last night around 8pm, pain radiates to his left arm. Pt has hx of arm pain and reports he was given percocet in the past for the pain and it helped.   Pt given nitro with relief of pain 562m saline 324 ASA

## 2021-03-19 NOTE — ED Provider Notes (Signed)
[facility] EMERGENCY DEPARTMENT Provider Note   CSN: [id number] Arrival date & time: 03/19/21  1024     History Chief Complaint  Patient presents with   Chest Pain   Arm Pain    Terry Mitchell is a 70 y.o. male history of hypertension, prostate cancer with spinal, rib, and other bony metastasis, present emergency department with chest pain and arm pain.  Patient reports that he has had a pressure sensation in his substernal chest beginning yesterday, although he has had in the past.  The pain is since gone away.  He also describes worsening pain in his left shoulder it radiates down towards his left hand into his left thumb.  This been going on for several weeks but feels like it is gotten worse.  He denies any falls or trauma.  Reports the pain is positional worse with deep inspiration.  He has been taking oxycodone at home as needed for pain every 2 hours or so, with minimal relief.  Denies any history of MI, cardiac stents.  HPI     Past Medical History:  Diagnosis Date   Depression    recently went on disability d/t diagnosis   History of recent fall 01/2018   missed the last step on ladder, causing back discomfort   Hypertension    Prostate cancer ([place]) 12/2017   prostate    Patient Active Problem List   Diagnosis Date Noted   High blood pressure 01/12/2021   Thrombocytopenia ([place]) 01/12/2021   Paraplegia ([place]) 12/20/2020   Pain of metastatic malignancy 12/14/2020   Cord compression ([place]) 12/11/2020   Stage 3b chronic kidney disease ([place]) 04/06/2020   Encounter for antineoplastic chemotherapy 04/06/2020   Hot flash in male 07/27/2019   Prostate cancer metastatic to bone ([place]) 05/15/2019   Prostate cancer ([place]) 03/19/2018   Androgen deprivation therapy 03/19/2018   PSA elevation 03/19/2018   Goals of care, counseling/discussion 03/19/2018   Chest pain 02/20/2018    Past Surgical History:  Procedure Laterality Date   BACK SURGERY  2001     2 rods and 6 screws in back   COLONOSCOPY WITH PROPOFOL N/A 09/24/2019   Procedure: COLONOSCOPY WITH PROPOFOL;  Surgeon: Jonathon Bellows, MD;  Location: [facility] ENDOSCOPY;  Service: Gastroenterology;  Laterality: N/A;   Left shoulder surgery Left 1998   removed a piece of bone around collar bone   PROSTATE BIOPSY N/A 02/28/2018   Procedure: PROSTATE BIOPSY;  Surgeon: Abbie Sons, MD;  Location: [facility] ORS;  Service: Urology;  Laterality: N/A;   TRANSRECTAL ULTRASOUND N/A 02/28/2018   Procedure: TRANSRECTAL ULTRASOUND;  Surgeon: Abbie Sons, MD;  Location: [facility] ORS;  Service: Urology;  Laterality: N/A;       Family History  Problem Relation Age of Onset   Stroke Mother    Kidney failure Mother    Diabetes Maternal Aunt     Social History   Tobacco Use   Smoking status: Former    Packs/day: 2.00    Types: Cigars, Cigarettes    Quit date: 07/24/2019    Years since quitting: 1.6   Smokeless tobacco: Never   Tobacco comments:    2 cigars  Vaping Use   Vaping Use: Never used  Substance Use Topics   Alcohol use: Yes    Alcohol/week: 9.0 standard drinks    Types: 9 Cans of beer per week    Comment: occasional   Drug use: Yes    Types: Cocaine, "Crack" cocaine  Comment: none within the year    Home Medications Prior to Admission medications   Medication Sig Start Date End Date Taking? Authorizing Provider  HYDROmorphone (DILAUDID) 2 MG tablet Take 1 tablet (2 mg total) by mouth every 6 (six) hours as needed for up to 12 doses for severe pain. 03/19/21  Yes Wyvonnia Dusky, MD  acetaminophen (TYLENOL) 325 MG tablet Take 1-2 tablets (325-650 mg total) by mouth every 4 (four) hours as needed for mild pain. 01/12/21   Love, Ivan Anchors, PA-C  albuterol (VENTOLIN HFA) 108 (90 Base) MCG/ACT inhaler Inhale 2 puffs into the lungs every 6 (six) hours as needed for wheezing or shortness of breath. Patient not taking: Reported on 01/24/2021 05/22/19   [name], MD  amLODipine (NORVASC) 5 MG  tablet Take 1.5 tablets (7.5 mg total) by mouth daily. 01/12/21   Love, Ivan Anchors, PA-C  benzonatate (TESSALON) 100 MG capsule Take 1 capsule (100 mg total) by mouth 3 (three) times daily as needed for cough. 01/12/21   Love, Ivan Anchors, PA-C  dexamethasone (DECADRON) 2 MG tablet Take 1 tablet (2 mg total) by mouth daily. 01/13/21   Love, Ivan Anchors, PA-C  enoxaparin (LOVENOX) 40 MG/0.4ML injection Inject 0.4 mLs (40 mg total) into the skin daily. 01/13/21   Love, Ivan Anchors, PA-C  gabapentin (NEURONTIN) 300 MG capsule Take 300 mg by mouth 2 (two) times daily. 03/10/21   [provider]  ondansetron (ZOFRAN) 4 MG tablet Take 2 tablets (8 mg total) by mouth every 8 (eight) hours as needed for nausea or vomiting. 01/12/21   Love, Ivan Anchors, PA-C  oxyCODONE-acetaminophen (PERCOCET) 5-325 MG tablet Take 1-2 tablets by mouth every 6 (six) hours as needed for severe pain. 01/12/21 01/12/22  Love, Ivan Anchors, PA-C  polyethylene glycol (MIRALAX / GLYCOLAX) 17 g packet Take 17 g by mouth 2 (two) times daily. 01/12/21   Love, Ivan Anchors, PA-C  vitamin B-12 (CYANOCOBALAMIN) 1000 MCG tablet Take 1 tablet (1,000 mcg total) by mouth daily. 01/12/21   Love, Ivan Anchors, PA-C  prochlorperazine (COMPAZINE) 10 MG tablet Take 1 tablet (10 mg total) by mouth every 6 (six) hours as needed (Nausea or vomiting). Patient not taking: Reported on 11/02/2019 05/01/19 04/08/20  [name], MD    Allergies    Penicillins and Lactose intolerance (gi)  Review of Systems   Review of Systems  Constitutional:  Negative for chills and fever.  HENT:  Negative for ear pain and sore throat.   Eyes:  Negative for pain and visual disturbance.  Respiratory:  Negative for cough and shortness of breath.   Cardiovascular:  Positive for chest pain. Negative for palpitations.  Gastrointestinal:  Negative for abdominal pain and vomiting.  Genitourinary:  Negative for dysuria and hematuria.  Musculoskeletal:  Positive for back pain. Negative for arthralgias.   Skin:  Negative for color change and rash.  Neurological:  Positive for weakness and numbness.  All other systems reviewed and are negative.  Physical Exam Updated Vital Signs BP (!) 155/91   Pulse 99   Temp 98.4 F (36.9 C)   Resp 16   Ht 6' (1.829 m)   Wt 88.5 kg   SpO2 95%   BMI 26.46 kg/m   Physical Exam Constitutional:      General: He is not in acute distress. HENT:     Head: Normocephalic and atraumatic.  Eyes:     Conjunctiva/sclera: Conjunctivae normal.     Pupils: Pupils are equal, round, and  reactive to light.  Cardiovascular:     Rate and Rhythm: Normal rate and regular rhythm.  Pulmonary:     Effort: Pulmonary effort is normal. No respiratory distress.  Abdominal:     General: There is no distension.     Tenderness: There is no abdominal tenderness.  Skin:    General: Skin is warm and dry.  Neurological:     General: No focal deficit present.     Mental Status: He is alert and oriented to person, place, and time. Mental status is at baseline.     Cranial Nerves: No cranial nerve deficit.     Motor: No weakness.  Psychiatric:        Mood and Affect: Mood normal.        Behavior: Behavior normal.    ED Results / Procedures / Treatments   Labs (all labs ordered are listed, but only abnormal results are displayed) Labs Reviewed  BASIC METABOLIC PANEL - Abnormal; Notable for the following components:      Result Value   Sodium 132 (*)    Glucose, Bld 115 (*)    Creatinine, Ser 1.26 (*)    All other components within normal limits  CBC - Abnormal; Notable for the following components:   RBC 2.92 (*)    Hemoglobin 9.2 (*)    HCT 28.8 (*)    Platelets 105 (*)    All other components within normal limits  TROPONIN I (HIGH SENSITIVITY)  TROPONIN I (HIGH SENSITIVITY)    EKG EKG Interpretation  Date/Time:  Sunday March 19 2021 10:28:40 EDT Ventricular Rate:  71 PR Interval:  134 QRS Duration: 88 QT Interval:  424 QTC Calculation: 460 R  Axis:   53 Text Interpretation: Normal sinus rhythm Nonspecific T wave abnormality Abnormal ECG Confirmed by Octaviano Glow [phone]) on 03/19/2021 3:43:23 PM  Radiology DG Chest 1 View  Result Date: 03/19/2021 CLINICAL DATA:  Left chest pain. EXAM: CHEST  1 VIEW COMPARISON:  CT chest 12/13/2020 . FINDINGS: Normal heart size. No pleural effusion or edema. No airspace opacities identified. Multifocal sclerotic rib metastases are identified bilaterally. IMPRESSION: No acute cardiopulmonary abnormalities. Bilateral sclerotic rib metastases. Electronically Signed   By: Kerby Moors M.D.   On: 03/19/2021 11:42    Procedures Procedures   Medications Ordered in ED Medications  HYDROmorphone (DILAUDID) injection 1 mg (1 mg Intravenous Given 03/19/21 1615)  dexamethasone (DECADRON) injection 8 mg (8 mg Intravenous Given 03/19/21 1615)    ED Course  I have reviewed the triage vital signs and the nursing notes.  Pertinent labs & imaging results that were available during my care of the patient were reviewed by me and considered in my medical decision making (see chart for details).  Chest pain and left arm pain, reprodicble with palpation and movement Imaging today and history consistent with bony mets to ribs/spine  This is likely pain related to his bone metastasis Pain significantly improved with IV dilaudid Minimal chest pain  ECG shows sinus rhythm at baseline for him, no acute ischemic changes Trop 12 -> 14 Lower suspicion for ACS clinically CBC near baseline Doubt sepsis, infection, PE, PTX, aortic dissection  He is neurovascularly intact with no signs of spinal cord impingement or weakness on exam.  However I advised he contact his oncologist regarding his metastasis workup - he may benefit from further spine/bone evaluation.  IV decadron given x 1 here for suspected neuropathetic pain    Clinical Course as of 03/20/21 [id number]  Sun Mar 19, 2021  1930 Patient reported significant  improvement of his arm pain with Dilaudid, given his history of metastatic bone cancer, I do think it be reasonable prescribe him a few Dilaudid tablets to have at home, as this appears to be more effective than his oxycodone.  I carefully explained to him not to take both tablets as that might be too many narcotics.  I also think that steroids may help.  Advised that he call his oncologist to discuss this worsening bone pain. [MT]    Clinical Course User Index [MT] Wyvonnia Dusky, MD    Final Clinical Impression(s) / ED Diagnoses Final diagnoses:  Bone metastasis ([place])  Pain of left upper extremity  Chest pain, unspecified type    Rx / DC Orders ED Discharge Orders          Ordered    HYDROmorphone (DILAUDID) 2 MG tablet  Every 6 hours PRN        03/19/21 1933             Wyvonnia Dusky, MD 03/20/21 [phone]

## 2021-03-19 NOTE — Discharge Instructions (Addendum)
I prescribed you a stronger pain medicine called Dilaudid which can use at home if your pain is severe.  Please do not take it at the same time as oxycodone, since these are both narcotics.  They can slow your breathing if taken together, and raise your risk of overdose.  We also gave you some IV steroids today which may help with the pain and swelling in your nerves and spine.  I think your nerve pain in your left arm may be coming from your spine.  You have cancer in the bones of your spine and your ribs.  This can be causing chest pain and arm pain.  Please call your oncologist to talk about this.  If your pain gets more intense, or you begin to feel lightheaded, or short of breath, please call 911 and come back to the ER.

## 2021-03-22 ENCOUNTER — Encounter: Payer: Self-pay | Admitting: Oncology

## 2021-03-29 ENCOUNTER — Other Ambulatory Visit: Payer: Medicaid Other | Admitting: Hospice

## 2021-03-29 ENCOUNTER — Other Ambulatory Visit: Payer: Self-pay

## 2021-03-29 ENCOUNTER — Encounter: Payer: Self-pay | Admitting: Oncology

## 2021-03-29 ENCOUNTER — Ambulatory Visit: Admit: 2021-03-29 | Discharge: 2021-03-30 | Payer: MEDICARE

## 2021-03-29 DIAGNOSIS — D499 Neoplasm of unspecified behavior of unspecified site: Principal | ICD-10-CM

## 2021-03-29 DIAGNOSIS — Z515 Encounter for palliative care: Secondary | ICD-10-CM

## 2021-03-29 DIAGNOSIS — C7951 Secondary malignant neoplasm of bone: Secondary | ICD-10-CM

## 2021-03-29 DIAGNOSIS — R269 Unspecified abnormalities of gait and mobility: Secondary | ICD-10-CM

## 2021-03-29 DIAGNOSIS — G8220 Paraplegia, unspecified: Secondary | ICD-10-CM

## 2021-03-29 NOTE — Progress Notes (Signed)
[facility] Consult Note Telephone: [phone]  Fax: [phone]  PATIENT NAME: Terry Mitchell [place] [address] [phone] (home)  DOB: 1950/12/23 MRN: [id number]  PRIMARY CARE PROVIDER:    [name], MD,  [place] [address] [phone]  REFERRING PROVIDER:   Frazier Mitchell, [place] [address],  [place] [address] [phone]  RESPONSIBLE PARTY:   Self [place]     Name Relation Home Work Mobile   Mitchell,Terry Daughter   [phone]       I met face to face with patient and family at home. Palliative Care was asked to follow this patient by consultation request of  Mitchell, Terry Perch, MD to address advance care planning, complex medical decision making and goals of care clarification. Terry Every DNP student present during visit. Terry Mitchell - son in law present at home during visit. This is the initial visit.    ASSESSMENT AND / RECOMMENDATIONS:   Advance Care Planning: Our advance care planning conversation included a discussion about:    The value and importance of advance care planning  Difference between Hospice and Palliative care Exploration of goals of care in the event of a sudden injury or illness  Identification and preparation of a healthcare agent  Review and updating or creation of an  advance directive document   CODE STATUS:  Patient was initially a DNR but requests to rescind the decision and to remove the signed DNR from his record.  Ramifications and implications of CODE STATUS discussed.  Patient elects to be a full code.  Goals of Care: Goals include to maximize quality of life and symptom management. Patient wishes to regain his ability to walk well with little assistance and with better balance.  Visit consisted of counseling and education dealing with the complex and emotionally intense issues of symptom management  and palliative care in the setting of serious and potentially life-threatening illness. Palliative care team will continue to support patient, patient's family, and medical team.  Support from family and a christian outreach ministry help him to cope.   I spent  46 minutes providing this initial consultation. More than 50% of the time in this consultation was spent on counseling patient and coordinating communication. --------------------------------------------------------------------------------------------------------------------------------------  Symptom Management/Plan: Metastatic prostate cancer: s/p chemo/radiation. Metastasis to the spine. Appointment in [place] this week with  Neurologist and next week with Oncologist Left shoulder pain: Managed with Ocycodone/acetaminophen. Constipation: Continue MiraLAX Mitchell Other Day and increase to daily if needed for constipation. Gait disturbance: Patient is a high fall risk.  Fall precautions discussed; need for using rolling walker for support stressed.  Last fall was 2 months ago with no injuries. Recommendation: PT OT for strengthening and gait training. Paraplegia: Right side.  PT/OT will be beneficial to patient. Routine CBC BMP  Follow up: Palliative care will continue to follow for complex medical decision making, advance care planning, and clarification of goals. Return 6 weeks or prn.Encouraged to call provider sooner with any concerns.   Family /Caregiver/Community Supports: Patient currently lives at home with his daughter Terry Mitchell and her family. Strong family support system identified.   HOSPICE ELIGIBILITY/DIAGNOSIS: TBD  Chief Complaint: Initial Palliative care visit  HISTORY OF PRESENT ILLNESS:  Terry Mitchell is a 70 y.o. year old male  with multiple medical conditions including metastatic prostate cancer.  Today patient with complain of left shoulder pain  which is getting worse in the last month, impairing his ADLs,  worse when he tries to lift up his hand. He reports now taking 2 tablets of oxycodone/acetaminophen 5/'3 2 5 ' mg Mitchell 6 hours as needed for pain.  Education provided on side effects of narcotic and also the need to continue with his bowel regimen.  He reports the pain medication is helpful.  He denies pain during visit because he had premedicated with oxycodone/acetaminophen. History obtained from review of EMR, discussion with primary team, caregiver, family and/or Terry Mitchell.  Review and summarization of Epic records shows history from other than patient. Rest of 10 point ROS asked and negative.  Review of lab tests/diagnostics   Results for Terry, Mitchell (MRN [id number]) as of 03/29/2021 13:32  Ref. Range 03/19/2021 11:11  Sodium Latest Ref Range: 135 - 145 mmol/L 132 (L)  Potassium Latest Ref Range: 3.5 - 5.1 mmol/L 3.6  Chloride Latest Ref Range: 98 - 111 mmol/L 100  CO2 Latest Ref Range: 22 - 32 mmol/L 23  Glucose Latest Ref Range: 70 - 99 mg/dL 115 (H)  BUN Latest Ref Range: 8 - 23 mg/dL 21  Creatinine Latest Ref Range: 0.61 - 1.24 mg/dL 1.26 (H)  Calcium Latest Ref Range: 8.9 - 10.3 mg/dL 9.1  Anion gap Latest Ref Range: 5 - 15  9  Results for Terry, Mitchell (MRN [id number]) as of 03/29/2021 13:32  Ref. Range 03/19/2021 11:11  WBC Latest Ref Range: 4.0 - 10.5 K/uL 5.4  RBC Latest Ref Range: 4.22 - 5.81 MIL/uL 2.92 (L)  Hemoglobin Latest Ref Range: 13.0 - 17.0 g/dL 9.2 (L)  HCT Latest Ref Range: 39.0 - 52.0 % 28.8 (L)  MCV Latest Ref Range: 80.0 - 100.0 fL 98.6  MCH Latest Ref Range: 26.0 - 34.0 pg 31.5  MCHC Latest Ref Range: 30.0 - 36.0 g/dL 31.9  RDW Latest Ref Range: 11.5 - 15.5 % 13.9  Platelets Latest Ref Range: 150 - 400 K/uL 105 (L)  nRBC Latest Ref Range: 0.0 - 0.2 % 0.0    ROS General: NAD EYES: denies vision changes ENMT: denies dysphagia Cardiovascular: denies chest pain/discomfort Pulmonary: denies cough, denies SOB Abdomen: endorses good appetite, denies  constipation/diarrhea GU: denies dysuria, urinary frequency MSK:  endorses weakness, fall 2 months ago Skin: denies rashes or wounds Neurological: Endorses occasional pain to left shoulder; denies insomnia Psych: Endorses positive mood Heme/lymph/immuno: denies bruises, abnormal bleeding  Physical Exam: Height/Weight: 6 feet/197 Ibs Constitutional: NAD General: Well groomed, cooperative EYES: anicteric sclera, lids intact, no discharge  ENMT: Moist mucous membrane CV: S1 S2, RRR, no LE edema Pulmonary: LCTA, no increased work of breathing, no cough, Abdomen: active BS + 4 quadrants, soft and non tender GU: no suprapubic tenderness MSK: weakness, unsteady gait, right side weakness, limited ROM Skin: warm and dry, no rashes or wounds on visible skin Neuro:  weakness, otherwise non focal Psych: non-anxious affect Hem/lymph/immuno: no widespread bruising   PAST MEDICAL HISTORY:  Active Ambulatory Problems    Diagnosis Date Noted   Chest pain 02/20/2018   Prostate cancer ([facility]) 03/19/2018   Androgen deprivation therapy 03/19/2018   PSA elevation 03/19/2018   Goals of care, counseling/discussion 03/19/2018   Prostate cancer metastatic to bone ([facility]) 05/15/2019   Hot flash in male 07/27/2019   Stage 3b chronic kidney disease ([facility]) 04/06/2020   Encounter for antineoplastic chemotherapy 04/06/2020   Cord compression ([facility]) 12/11/2020   Pain of metastatic malignancy 12/14/2020   Paraplegia ([facility]) 12/20/2020  High blood pressure 01/12/2021   Thrombocytopenia ([facility]) 01/12/2021   Resolved Ambulatory Problems    Diagnosis Date Noted   No Resolved Ambulatory Problems   Past Medical History:  Diagnosis Date   Depression    History of recent fall 01/2018   Hypertension     SOCIAL HX:  Social History   Tobacco Use   Smoking status: Former    Packs/day: 2.00    Types: Cigars, Cigarettes    Quit date: 07/24/2019    Years since quitting: 1.6   Smokeless tobacco: Never   Tobacco  comments:    2 cigars  Substance Use Topics   Alcohol use: Yes    Alcohol/week: 9.0 standard drinks    Types: 9 Cans of beer per week    Comment: occasional     FAMILY HX:  Family History  Problem Relation Age of Onset   Stroke Mother    Kidney failure Mother    Diabetes Maternal Aunt       ALLERGIES:  Allergies  Allergen Reactions   Penicillins Hives and Other (See Comments)    Hives, welts, swelling profusely . Pretty severe reaction Has patient had a PCN reaction causing immediate rash, facial/tongue/throat swelling, SOB or lightheadedness with hypotension: No Has patient had a PCN reaction causing severe rash involving mucus membranes or skin necrosis: Yes Has patient had a PCN reaction that required hospitalization: Yes Has patient had a PCN reaction occurring within the last 10 years: No If all of the above answers are "NO", then may proceed with Cephalosporin use.    Lactose Intolerance (Gi) Other (See Comments)    Gi upset      PERTINENT MEDICATIONS:  Outpatient Encounter Medications as of 03/29/2021  Medication Sig   acetaminophen (TYLENOL) 325 MG tablet Take 1-2 tablets (325-650 mg total) by mouth Mitchell 4 (four) hours as needed for mild pain.   albuterol (VENTOLIN HFA) 108 (90 Base) MCG/ACT inhaler Inhale 2 puffs into the lungs Mitchell 6 (six) hours as needed for wheezing or shortness of breath. (Patient not taking: Reported on 01/24/2021)   amLODipine (NORVASC) 5 MG tablet Take 1.5 tablets (7.5 mg total) by mouth daily.   benzonatate (TESSALON) 100 MG capsule Take 1 capsule (100 mg total) by mouth 3 (three) times daily as needed for cough.   dexamethasone (DECADRON) 2 MG tablet Take 1 tablet (2 mg total) by mouth daily.   enoxaparin (LOVENOX) 40 MG/0.4ML injection Inject 0.4 mLs (40 mg total) into the skin daily.   gabapentin (NEURONTIN) 300 MG capsule Take 300 mg by mouth 2 (two) times daily.   HYDROmorphone (DILAUDID) 2 MG tablet Take 1 tablet (2 mg total) by mouth  Mitchell 6 (six) hours as needed for up to 12 doses for severe pain.   ondansetron (ZOFRAN) 4 MG tablet Take 2 tablets (8 mg total) by mouth Mitchell 8 (eight) hours as needed for nausea or vomiting.   oxyCODONE-acetaminophen (PERCOCET) 5-325 MG tablet Take 1-2 tablets by mouth Mitchell 6 (six) hours as needed for severe pain.   polyethylene glycol (MIRALAX / GLYCOLAX) 17 g packet Take 17 g by mouth 2 (two) times daily.   vitamin B-12 (CYANOCOBALAMIN) 1000 MCG tablet Take 1 tablet (1,000 mcg total) by mouth daily.   [DISCONTINUED] prochlorperazine (COMPAZINE) 10 MG tablet Take 1 tablet (10 mg total) by mouth Mitchell 6 (six) hours as needed (Nausea or vomiting). (Patient not taking: Reported on 11/02/2019)   Facility-Administered Encounter Medications as of 03/29/2021  Medication  sodium chloride flush (NS) 0.9 % injection 10 mL     Thank you for the opportunity to participate in the care of Terry Mitchell.  The palliative care team will continue to follow. Please call our office at [phone] if we can be of additional assistance.   Note: Portions of this note were generated with [employer]. Dictation errors may occur despite best attempts at proofreading.  Teodoro Spray, NP

## 2021-03-30 ENCOUNTER — Encounter: Payer: Self-pay | Admitting: Oncology

## 2021-03-31 ENCOUNTER — Ambulatory Visit: Admit: 2021-03-31 | Discharge: 2021-04-01 | Payer: MEDICARE

## 2021-03-31 DIAGNOSIS — C61 Malignant neoplasm of prostate: Principal | ICD-10-CM

## 2021-04-05 ENCOUNTER — Ambulatory Visit: Admit: 2021-04-05 | Discharge: 2021-04-06 | Payer: MEDICARE

## 2021-04-05 DIAGNOSIS — C61 Malignant neoplasm of prostate: Principal | ICD-10-CM

## 2021-04-05 DIAGNOSIS — G959 Disease of spinal cord, unspecified: Principal | ICD-10-CM

## 2021-04-06 ENCOUNTER — Ambulatory Visit
Admit: 2021-04-06 | Discharge: 2021-04-07 | Payer: MEDICARE | Attending: Hematology & Oncology | Primary: Hematology & Oncology

## 2021-04-06 DIAGNOSIS — C7951 Secondary malignant neoplasm of bone: Principal | ICD-10-CM

## 2021-04-06 DIAGNOSIS — C61 Malignant neoplasm of prostate: Principal | ICD-10-CM

## 2021-04-06 MED ORDER — OXYCODONE 5 MG TABLET
ORAL_TABLET | Freq: Four times a day (QID) | ORAL | 0 refills | 12 days | Status: CP | PRN
Start: 2021-04-06 — End: ?

## 2021-04-06 MED ORDER — MORPHINE ER 15 MG TABLET,EXTENDED RELEASE
ORAL_TABLET | Freq: Two times a day (BID) | ORAL | 0 refills | 30 days | Status: CP
Start: 2021-04-06 — End: ?

## 2021-04-06 MED ORDER — ABIRATERONE 250 MG TABLET
ORAL_TABLET | Freq: Every day | ORAL | 11 refills | 30.00000 days | Status: CP
Start: 2021-04-06 — End: 2021-04-06
  Filled 2021-04-12: qty 120, 30d supply, fill #0

## 2021-04-06 MED ORDER — PREDNISONE 5 MG TABLET
ORAL_TABLET | Freq: Every day | ORAL | 11 refills | 30.00000 days | Status: CP
Start: 2021-04-06 — End: 2021-04-06
  Filled 2021-04-12: qty 30, 30d supply, fill #0

## 2021-04-07 DIAGNOSIS — C7951 Secondary malignant neoplasm of bone: Principal | ICD-10-CM

## 2021-04-07 DIAGNOSIS — C61 Malignant neoplasm of prostate: Principal | ICD-10-CM

## 2021-04-10 ENCOUNTER — Institutional Professional Consult (permissible substitution): Admit: 2021-04-10 | Discharge: 2021-04-11 | Payer: MEDICARE

## 2021-04-10 DIAGNOSIS — C7951 Secondary malignant neoplasm of bone: Principal | ICD-10-CM

## 2021-04-10 DIAGNOSIS — C61 Malignant neoplasm of prostate: Principal | ICD-10-CM

## 2021-04-11 ENCOUNTER — Encounter: Payer: Self-pay | Admitting: Oncology

## 2021-04-11 NOTE — Unmapped (Signed)
[facility] Specialty Medication Onboarding    Specialty Medication: Abiraterone 250mg   Prior Authorization: Approved   Financial Assistance: No - copay  <$25  Final Copay/Day Supply: $1.35 / 30    Insurance Restrictions: None     Notes to Pharmacist:     The triage team has completed the benefits investigation and has determined that the patient is able to fill this medication at [facility]. Please contact the patient to complete the onboarding or follow up with the prescribing physician as needed.

## 2021-04-12 ENCOUNTER — Encounter: Payer: Self-pay | Admitting: Oncology

## 2021-04-12 NOTE — Unmapped (Signed)
[facility]   Patient Onboarding/Medication Counseling    Kevin Cohen is a 70 y.o. male with Malignant neoplasm of prostate metastatic to bone who I am counseling today on initiation of therapy.  I am speaking to the patient.    Was a Nurse, learning disability used for this call? No    Verified patient's date of birth / HIPAA.    Specialty medication(s) to be sent: Hematology/Oncology: abiraterone 250mg     Non-specialty medications/supplies to be sent: prednisone 5 mg    Medications not needed at this time: none     Zytiga (Abiraterone)    Medication & Administration     Dosage: Take 4 tablets (1000mg ) by mouth once daily.   Prednisone 5 mg : Take 1 tablet (5 mg total) by mouth daily.     Administration:   ??? Take on an empty stomach (at least 1 hour before or 2 hours after food)  ??? Take with prednisone  ??? Take with calcium and vitamin D -     Adherence/Missed dose instructions:  ??? If miss a dose, wait until next day to take normal dose    Goals of Therapy     ??? Prevent disease progression    Side Effects & Monitoring Parameters     ??? Common side effects  ??? Flushing/hot flashes  ??? Signs of high blood pressure  (bad headache, dizziness/passing out, or change in eyesight)  ??? Muscle pain  ??? Joint pain or swelling  ??? Heartburn  ??? Cough, stuffy nose, sore throat  ??? Upset stomach, nausea or vomiting  ??? Diarrhea or constipation  ??? Feeling weak or tired  ??? Trouble sleeping    ??? The following side effects should be reported to the provider:  ??? Allergic reaction  ??? Electrolyte problems (mood changes, confusion, muscle pain, abnormal heartbeat, seizures)  ??? Signs of weak adrenal gland (bad upset stomach or throwing up, very bad dizziness or passing out, muscle weakness, feeling very tired, mood changes, not hungry, or weight loss)  ??? Signs of UTI  ??? Signs of high blood sugar (confusion, sleepiness, increased thirst, hunger, and urination, fruity breath)  ??? Feeling weak or tired  ??? Bruising  ??? Bone pain  ??? Swelling, weight gain, or trouble breathing  ??? Liver problems (dark urine, not hungry, upset stomach, light colored stool, yellow skin or eyes    Monitoring Parameters  ??? LFT monitoring every 2 weeks for 3 months and monthly thereafter  ??? Serum potassium levels frequently during treatment  ??? Monitor blood pressure    Contraindications, Warnings, & Precautions     ??? Contraindicated in women who are or may become pregnant  ??? Adrenocortical insufficiency  ??? Hepatotoxicity  ??? Mineralocorticoid excess - monitor for hypertension, hypokalemia, and fluid retention. Take with a corticosteroid.  ??? Cardiovascular disease - control hypertension and correct hypokalemia prior to and during treatment.    Drug/Food Interactions     ??? Medication list reviewed in Epic. The patient was instructed to inform the care team before taking any new medications or supplements. No drug interactions identified.   ??? List any food/vaccine restrictions here.     Storage, Handling Precautions, & Disposal     ??? Store at room temperature  ??? This drug is considered hazardous and should be handled as little as possible.  If someone else helps with medication administration, they should wear gloves.    Current Medications (including OTC/herbals), Comorbidities and Allergies     Current Outpatient  Medications   Medication Sig Dispense Refill   ??? abiraterone (ZYTIGA) 250 mg Tab tablet Take 4 tablets (1,000 mg total) by mouth daily. 120 tablet 11   ??? amLODIPine (NORVASC) 5 MG tablet TAKE 1 TABLET BY MOUTH ONCE DAILY FOR BLOOD PRESSURE     ??? atorvastatin (LIPITOR) 40 MG tablet Take 40 mg by mouth daily.     ??? benzonatate (TESSALON) 100 MG capsule TAKE 1 CAPSULE BY MOUTH THREE TIMES DAILY AS NEEDED FOR COUGH     ??? FEROSUL 325 mg (65 mg iron) tablet TAKE 1 TABLET BY MOUTH EVERY OTHER DAY FOR ANEMIA     ??? gabapentin (NEURONTIN) 300 MG capsule TAKE 1 CAPSULE BY MOUTH TWICE DAILY FOR PAIN     ??? HYDROmorphone (DILAUDID) 2 MG tablet TAKE 1 TABLET BY MOUTH EVERY 6 HOURS AS NEEDED FOR SEVERE PAIN FOR UP TO 12 DOSES     ??? MORPhine (MS CONTIN) 15 MG 12 hr tablet Take 1 tablet (15 mg total) by mouth every twelve (12) hours. 60 tablet 0   ??? ondansetron (ZOFRAN) 4 MG tablet TAKE 1 TABLET BY MOUTH EVERY 8 HOURS AS NEEDED FOR NAUSEA     ??? oxyCODONE (ROXICODONE) 5 MG immediate release tablet Take 1-2 tablets (5-10 mg total) by mouth every six (6) hours as needed for pain. 90 tablet 0   ??? oxyCODONE-acetaminophen (PERCOCET) 5-325 mg per tablet TAKE 1 TO 2 TABLETS BY MOUTH EVERY 8 HOURS AS NEEDED FOR PAIN     ??? predniSONE (DELTASONE) 5 MG tablet Take 1 tablet (5 mg total) by mouth daily. 30 tablet 11     No current facility-administered medications for this visit.       Allergies   Allergen Reactions   ??? Penicillins Hives and Other (See Comments)     Hives, welts, swelling profusely . Pretty severe reaction  Has patient had a PCN reaction causing immediate rash, facial/tongue/throat swelling, SOB or lightheadedness with hypotension: No  Has patient had a PCN reaction causing severe rash involving mucus membranes or skin necrosis: Yes  Has patient had a PCN reaction that required hospitalization: Yes  Has patient had a PCN reaction occurring within the last 10 years: No  If all of the above answers are NO, then may proceed with Cephalosporin use.       Patient Active Problem List   Diagnosis   ??? Prostate cancer metastatic to bone (CMS-HCC)       Reviewed and up to date in Epic.    Appropriateness of Therapy     Acute infections noted within Epic:  No active infections  Patient reported infection: None    Is medication and dose appropriate based on diagnosis and infection status? Yes    Prescription has been clinically reviewed: Yes      Baseline Quality of Life Assessment      How many days over the past month did your Malignant neoplasm of prostate metastatic to bone  keep you from your normal activities? For example, brushing your teeth or getting up in the morning. 0    Financial Information Medication Assistance provided: Prior Authorization    Anticipated copay of $1.35 / 30 days each for abi and pred  reviewed with patient. Verified delivery address.    Delivery Information     Scheduled delivery date: 04/13/21    Expected start date: ASAP    Medication will be delivered via UPS to the prescription address in [facility].  This shipment will  not require a signature.      Explained the services we provide at [facility] and that each month we would call to set up refills.  Stressed importance of returning phone calls so that we could ensure they receive their medications in time each month.  Informed patient that we should be setting up refills 7-10 days prior to when they will run out of medication.  A pharmacist will reach out to perform a clinical assessment periodically.  Informed patient that a welcome packet, containing information about our pharmacy and other support services, a Notice of Privacy Practices, and a drug information handout will be sent.      The patient or caregiver noted above participated in the development of this care plan and knows that they can request review of or adjustments to the care plan at any time.      Patient or caregiver verbalized understanding of the above information as well as how to contact the pharmacy at [phone] option 4 with any questions/concerns.  The pharmacy is open Monday through Friday 8:30am-4:30pm.  A pharmacist is available 24/7 via pager to answer any clinical questions they may have.    Patient Specific Needs     - Does the patient have any physical, cognitive, or cultural barriers? No    - Does the patient have adequate living arrangements? (i.e. the ability to store and take their medication appropriately) Yes    - Did you identify any home environmental safety or security hazards? No    - Patient prefers to have medications discussed with  Patient     - Is the patient or caregiver able to read and understand education materials at a high school level or above? Yes    - Patient's primary language is  English     - Is the patient high risk? Yes, patient is taking oral chemotherapy. Appropriateness of therapy as been assessed    - Does the patient require physician intervention or other additional services (i.e. dietary/nutrition, smoking cessation, social work)? No      Tomeshia Pizzi [facility] Specialty Pharmacist

## 2021-04-17 ENCOUNTER — Ambulatory Visit: Admit: 2021-04-17 | Discharge: 2021-04-18 | Payer: MEDICARE

## 2021-04-25 DIAGNOSIS — M5126 Other intervertebral disc displacement, lumbar region: Principal | ICD-10-CM

## 2021-04-25 DIAGNOSIS — C799 Secondary malignant neoplasm of unspecified site: Principal | ICD-10-CM

## 2021-04-25 LAB — BASIC METABOLIC PANEL
ANION GAP: 6 mmol/L (ref 5–14)
BLOOD UREA NITROGEN: 22 mg/dL (ref 9–23)
BUN / CREAT RATIO: 17
CALCIUM: 9 mg/dL (ref 8.7–10.4)
CHLORIDE: 106 mmol/L (ref 98–107)
CO2: 27 mmol/L (ref 20.0–31.0)
CREATININE: 1.33 mg/dL — ABNORMAL HIGH
EGFR CKD-EPI (2021) MALE: 58 mL/min/{1.73_m2} — ABNORMAL LOW (ref >=60)
GLUCOSE RANDOM: 99 mg/dL (ref 70–179)
POTASSIUM: 4 mmol/L (ref 3.4–4.8)
SODIUM: 139 mmol/L (ref 135–145)

## 2021-04-25 LAB — APTT
APTT: 26.4 s (ref 25.1–36.5)
HEPARIN CORRELATION: 0.2

## 2021-04-25 LAB — CBC
HEMATOCRIT: 23.3 % — ABNORMAL LOW (ref 39.0–48.0)
HEMOGLOBIN: 7.8 g/dL — ABNORMAL LOW (ref 12.9–16.5)
MEAN CORPUSCULAR HEMOGLOBIN CONC: 33.3 g/dL (ref 32.0–36.0)
MEAN CORPUSCULAR HEMOGLOBIN: 30.8 pg (ref 25.9–32.4)
MEAN CORPUSCULAR VOLUME: 92.5 fL (ref 77.6–95.7)
MEAN PLATELET VOLUME: 7.5 fL (ref 6.8–10.7)
PLATELET COUNT: 124 10*9/L — ABNORMAL LOW (ref 150–450)
RED BLOOD CELL COUNT: 2.52 10*12/L — ABNORMAL LOW (ref 4.26–5.60)
RED CELL DISTRIBUTION WIDTH: 16.8 % — ABNORMAL HIGH (ref 12.2–15.2)
WBC ADJUSTED: 3 10*9/L — ABNORMAL LOW (ref 3.6–11.2)

## 2021-04-25 LAB — MAGNESIUM: MAGNESIUM: 2 mg/dL (ref 1.6–2.6)

## 2021-04-25 LAB — PROTIME-INR
INR: 1.14
PROTIME: 12.9 s — ABNORMAL HIGH (ref 9.8–12.8)

## 2021-04-25 LAB — PHOSPHORUS: PHOSPHORUS: 3.1 mg/dL (ref 2.4–5.1)

## 2021-04-25 NOTE — Unmapped (Signed)
Called and spoke with Mr. Tinnell. He stated since around 9/15-16, he has had loss of function in his left arm. He states he has to pick it up with his other hand and move it. He also reports numbness/tingling in his thumb and first two fingers. He can open and close his hand. He has neck and left shoulder stiffness. He also reports his balance has worsened, he is mostly using a wheelchair, and uses a walker for little baby steps with the assistance of his daughter.   We discussed possible blood flow study to r/o DVT, he is agreeable to this.   Will discuss with surgeon.

## 2021-04-25 NOTE — Unmapped (Signed)
RN spoke with patient, I am trying to get in touch with the doctor to get the tumor off my spine. I have loss more control of my left arm. I can still can open and close my hand. I can not lift my left arm. I have no control of the left arm. My neck and shoulder are getting more stiff. I have tried the muscle cream because it aches. I take several pills but nothing seems to help it. I need the operation because it can't be any worst than what it is now. I am in a wheelchair. I can still use the walker. I have no balance. I just pray that I get some operation can remove some of this stuff.

## 2021-04-26 ENCOUNTER — Telehealth: Payer: Self-pay | Admitting: Radiation Oncology

## 2021-04-26 ENCOUNTER — Encounter: Admit: 2021-04-26 | Payer: MEDICARE

## 2021-04-26 ENCOUNTER — Ambulatory Visit: Admit: 2021-04-26 | Payer: MEDICARE

## 2021-04-26 ENCOUNTER — Ambulatory Visit: Admit: 2021-04-26 | Discharge: 2021-05-06 | Disposition: A | Discharge status: Rehabilitation Facility | Payer: MEDICARE

## 2021-04-26 LAB — CBC W/ AUTO DIFF
BASOPHILS ABSOLUTE COUNT: 0 10*9/L (ref 0.0–0.1)
BASOPHILS RELATIVE PERCENT: 0.6 %
EOSINOPHILS ABSOLUTE COUNT: 0 10*9/L (ref 0.0–0.5)
EOSINOPHILS RELATIVE PERCENT: 0.9 %
HEMATOCRIT: 24.2 % — ABNORMAL LOW (ref 39.0–48.0)
HEMOGLOBIN: 8.2 g/dL — ABNORMAL LOW (ref 12.9–16.5)
LYMPHOCYTES ABSOLUTE COUNT: 0.3 10*9/L — ABNORMAL LOW (ref 1.1–3.6)
LYMPHOCYTES RELATIVE PERCENT: 8.7 %
MEAN CORPUSCULAR HEMOGLOBIN CONC: 33.8 g/dL (ref 32.0–36.0)
MEAN CORPUSCULAR HEMOGLOBIN: 31.2 pg (ref 25.9–32.4)
MEAN CORPUSCULAR VOLUME: 92.4 fL (ref 77.6–95.7)
MEAN PLATELET VOLUME: 7.8 fL (ref 6.8–10.7)
MONOCYTES ABSOLUTE COUNT: 0.3 10*9/L (ref 0.3–0.8)
MONOCYTES RELATIVE PERCENT: 7.7 %
NEUTROPHILS ABSOLUTE COUNT: 3 10*9/L (ref 1.8–7.8)
NEUTROPHILS RELATIVE PERCENT: 82.1 %
PLATELET COUNT: 133 10*9/L — ABNORMAL LOW (ref 150–450)
RED BLOOD CELL COUNT: 2.61 10*12/L — ABNORMAL LOW (ref 4.26–5.60)
RED CELL DISTRIBUTION WIDTH: 16.3 % — ABNORMAL HIGH (ref 12.2–15.2)
WBC ADJUSTED: 3.7 10*9/L (ref 3.6–11.2)

## 2021-04-26 LAB — COMPREHENSIVE METABOLIC PANEL
ALBUMIN: 3.3 g/dL — ABNORMAL LOW (ref 3.4–5.0)
ALKALINE PHOSPHATASE: 899 U/L — ABNORMAL HIGH (ref 46–116)
ALT (SGPT): <7 U/L — ABNORMAL LOW (ref 10–49)
ANION GAP: 6 mmol/L (ref 5–14)
AST (SGOT): 16 U/L (ref <=34)
BILIRUBIN TOTAL: 0.5 mg/dL (ref 0.3–1.2)
BLOOD UREA NITROGEN: 19 mg/dL (ref 9–23)
BUN / CREAT RATIO: 17
CALCIUM: 9 mg/dL (ref 8.7–10.4)
CHLORIDE: 105 mmol/L (ref 98–107)
CO2: 26 mmol/L (ref 20.0–31.0)
CREATININE: 1.15 mg/dL — ABNORMAL HIGH
EGFR CKD-EPI (2021) MALE: 69 mL/min/{1.73_m2} (ref >=60)
GLUCOSE RANDOM: 127 mg/dL (ref 70–179)
POTASSIUM: 4.4 mmol/L (ref 3.4–4.8)
PROTEIN TOTAL: 6.2 g/dL (ref 5.7–8.2)
SODIUM: 137 mmol/L (ref 135–145)

## 2021-04-26 LAB — IRON PANEL
IRON SATURATION: 24 % (ref 20–55)
IRON: 58 ug/dL — ABNORMAL LOW
TOTAL IRON BINDING CAPACITY: 245 ug/dL — ABNORMAL LOW (ref 250–425)

## 2021-04-26 LAB — RETICULOCYTES
RETICULOCYTE ABSOLUTE COUNT: 71.6 10*9/L (ref 23.0–100.0)
RETICULOCYTE COUNT PCT: 2.72 % — ABNORMAL HIGH (ref 0.50–2.17)

## 2021-04-26 LAB — FIBRINOGEN: FIBRINOGEN LEVEL: 422 mg/dL (ref 175–500)

## 2021-04-26 LAB — FERRITIN: FERRITIN: 297.8 ng/mL

## 2021-04-26 LAB — VITAMIN B12: VITAMIN B-12: 555 pg/mL (ref 211–911)

## 2021-04-26 MED ADMIN — docusate sodium (COLACE) capsule 100 mg: 100 mg | ORAL | @ 02:00:00

## 2021-04-26 MED ADMIN — iohexoL (OMNIPAQUE) 350 mg iodine/mL solution 50 mL: 50 mL | INTRAVENOUS | @ 21:00:00 | Stop: 2021-04-26

## 2021-04-26 MED ADMIN — midazolam (VERSED) injection: INTRAVENOUS | @ 21:00:00 | Stop: 2021-04-26

## 2021-04-26 MED ADMIN — amLODIPine (NORVASC) tablet 5 mg: 5 mg | ORAL | @ 14:00:00

## 2021-04-26 MED ADMIN — MORPhine (MS CONTIN) 12 hr tablet 15 mg: 15 mg | ORAL | @ 04:00:00 | Stop: 2021-05-09

## 2021-04-26 MED ADMIN — Abiraterone (ZYTIGA) tablet 250 mg **Patient Supplied**: 1000 mg | ORAL | @ 16:00:00

## 2021-04-26 MED ADMIN — senna (SENOKOT) tablet 2 tablet: 2 | ORAL | @ 16:00:00 | Stop: 2021-05-01

## 2021-04-26 MED ADMIN — dexAMETHasone (DECADRON) tablet 4 mg: 4 mg | ORAL | @ 14:00:00

## 2021-04-26 MED ADMIN — gabapentin (NEURONTIN) capsule 300 mg: 300 mg | ORAL | @ 04:00:00

## 2021-04-26 MED ADMIN — ferrous sulfate tablet 325 mg: 325 mg | ORAL | @ 14:00:00

## 2021-04-26 MED ADMIN — fentaNYL (PF) (SUBLIMAZE) injection: INTRAVENOUS | @ 21:00:00 | Stop: 2021-04-26

## 2021-04-26 MED ADMIN — MORPhine injection 2 mg: 2 mg | INTRAVENOUS | @ 20:00:00 | Stop: 2021-05-09

## 2021-04-26 MED ADMIN — lidocaine (XYLOCAINE) 10 mg/mL (1 %) injection: INTRADERMAL | @ 21:00:00 | Stop: 2021-04-26

## 2021-04-26 MED ADMIN — polyethylene glycol (MIRALAX) packet 17 g: 17 g | ORAL | @ 16:00:00 | Stop: 2021-05-04

## 2021-04-26 MED ADMIN — gabapentin (NEURONTIN) capsule 300 mg: 300 mg | ORAL | @ 14:00:00

## 2021-04-26 MED ADMIN — atorvastatin (LIPITOR) tablet 40 mg: 40 mg | ORAL | @ 14:00:00

## 2021-04-26 MED ADMIN — dexAMETHasone (DECADRON) tablet 4 mg: 4 mg | ORAL | @ 04:00:00

## 2021-04-26 MED ADMIN — docusate sodium (COLACE) capsule 100 mg: 100 mg | ORAL | @ 14:00:00

## 2021-04-26 MED ADMIN — HYDROmorphone (DILAUDID) tablet 2 mg: 2 mg | ORAL | @ 14:00:00 | Stop: 2021-05-09

## 2021-04-26 MED ADMIN — oxyCODONE-acetaminophen (PERCOCET) 5-325 mg tablet 1 tablet: 1 | ORAL | @ 02:00:00 | Stop: 2021-04-25

## 2021-04-26 MED ADMIN — MORPhine (MS CONTIN) 12 hr tablet 15 mg: 15 mg | ORAL | @ 16:00:00 | Stop: 2021-05-09

## 2021-04-26 NOTE — Unmapped (Signed)
NEUROSURGERY INPATIENT HISTORY AND PHYSICAL    Assessment and Plan  Kevin Cohen is a 70 y.o. male with PMH of HTN, HLD, and metastatic prostate cancer with known osseous mets (s/p multiple rounds of chemotherapy and radiation), who presents with several month history of symptoms concerning for cervical myelopathy and several week history of worsening LUE weakness. MRI total spine from 9/14 shows diffuse metastatic disease with epidural thickening and spinal canal stenosis from C3-5 without cord signal change, and left foraminal invasion at C5-6.  On neurologic exam, patient exhibits myelopathic signs on the right, and C5-6 radiculopathic signs on the left. Given the rapid spread of his disease and worsening upper extremity weakness, he is planned for OR on Thursday 10/6 for C3-6 posterior decompression and instrumented fusion.     - Bilateral lower extremity PVLs to rule out DVT, possible IVC filter placement prior to operation if positive for DVTs  - Consult oncology and radiation oncology in the morning to discuss plan of care  - Dex 4 BID   - Home medications resumed       Patient was discussed with Carollee Herter, MD and staffed with [facility]      Problem List  Active Problems:    Displacement of lumbar intervertebral disc without myelopathy    Metastatic adenocarcinoma (CMS-HCC)  Resolved Problems:    * No resolved hospital problems. *      History of Present Illness  Kevin Cohen is a 70 y.o. male who presents cervical myelopathy and worsening LUE weakness. Patient has prostate cancer, diagnosed in 2019, with nodal involement and bony metastases to the cervical and thoracic spine, including known T5-6 met since May 2022. Patient has already received several rounds of chemotherapy and radiation, last in 2021 and 2020, respectively. He was seen in neurosurgery clinic 9/9 and on exam at that time, he had good strength in his BUE but significant BLE weakness and clonus. He was wheelchair bound at that time. MRI total spine was ordered. Patient subsequently was seen by oncology on 9/15 and he received ADT injections a week later and was started on abiraterone and prednisone (5 daily since 9/21). Patient called spine center clinic today to express that he was significantly weak in his left arm. He reports that this has been progressively worsening for several weeks, and that his myelopathic symptoms which have been present since May, including gait instability requiring a wheelchair and difficulty opening jars etc, have also worsened over the past few weeks. He complains of numbness/tingling in all finger tips of his right hand and in only the C6 distribution in the left hand. He reports requiring aggressive bowel regimen at home and maintains the ability to urinate spontaneously.     Review of Systems  A 10-system review of systems was conducted and was negative except as documented above in the HPI.    ______________________________________________________________________    Physical Exam  General: No acute distress; There is no height or weight on file to calculate BMI.    Neurological Exam:  EOSp  Ox3  PERRL  EOMI  FS  TML  RUE 5 delt, 5 bi, 5 tri, 5 we, 5 wf, 4 hg, 4- hi  LUE 2 delt, 2 bi, 4+ tri, 4+ we, 4+ wf, 4- hg, 3 hi  BLE 5 hf, 5 ke, 5 df, 5 pf, 5 ehl  R hoffman's  No clonus  DTR 3+ LUE   Diminished sensation in fingertips    Cardiovascular: Warm and  well perfused with good pulses, no edema  Pulmonary: Unlabored breathing, no pursed lips or wheezing, acyanotic  Abdomen: Soft, nontender, nondistended    Neurological imaging reviewed  MRI Cervical thoracic lumbar spine 9/14    The patient's vitals, intake/output, medications, labs, and relevant neurological imaging were independently reviewed.    ---Historical data---    Allergies  Penicillins    Medications  Updated and/or reviewed in Epic.  Medications relevant to this initial evaluation listed below.        Past Medical History  No past medical history on file.      Past Surgical History  No past surgical history on file.    Family History  No family history on file.    Social History

## 2021-04-26 NOTE — Unmapped (Signed)
Brief Radiation Oncology Note    Kevin Cohen is a 70 y.o. male with history of metastatic prostate cancer and he reports prior pelvic RT, now with several month history of symptoms concerning for cervical myelopathy and several week history of worsening LUE weakness. MRI total spine from 9/14 shows diffuse metastatic disease with epidural thickening and spinal canal stenosis from C3-5 without cord signal change, and left foraminal invasion at C5-6.  On neurologic exam, patient exhibits myelopathic signs on the right, and C5-6 radiculopathic signs on the left. Given the rapid spread of his disease and worsening upper extremity weakness, he is planned for OR on Thursday 10/6 for C3-6 posterior decompression and instrumented fusion.     We briefly discussed the indications for post-operative radiation and per the surgical team the plan is for the patient to go to [facility] after surgery.     Upon transfer to [facility], please page the radiation team and we will have the patient meet with Dr. Carles Collet in [place] to discuss post-operative radiation.     Winferd Humphrey, MD, PhD  Radiation Oncology PGY-5

## 2021-04-26 NOTE — Unmapped (Signed)
[facility]  DIVISION OF INTERVENTIONAL RADIOLOGY CONSULTATION       IR Consultation Note     Requesting Attending Physician: Ezequiel Kayser*  Service Requesting Consult: Neurosurgery [facility])    Date of Service: 04/26/2021  Consulting Interventional Radiologist: Dr. Orlando Penner      HPI:  70 y.o. male with PMH of HTN, HLD, and metastatic prostate cancer with known osseous mets (s/p multiple rounds of chemotherapy and radiation), who presents with several month history of symptoms concerning for cervical myelopathy and several week history of worsening LUE weakness. MRI total spine from 9/14 shows diffuse metastatic disease with epidural thickening and spinal canal stenosis from C3-5 without cord signal change, and left foraminal invasion at C5-6. He is admitted for a planned OR 10/6 for PCDF C3-C6. He was found to have acute DVT in the left lower extremity. Per neurosurgery, he cannot be anticoagulated for 2 weeks post op. We are consulted for IVC filter placement.      PAST MEDICAL HISTORY:  No past medical history on file.    PAST SURGICAL HISTORY:  No past surgical history on file.    SOCIAL HISTORY:  Social History     Socioeconomic History   ??? Marital status: Single       MEDICATIONS:     Current Facility-Administered Medications:   ???  Abiraterone (ZYTIGA) tablet 250 mg **Patient Supplied**, 1,000 mg, Oral, Daily, Richrd Humbles, MD, 1,000 mg at 04/26/21 1211  ???  acetaminophen (TYLENOL) tablet 650 mg, 650 mg, Oral, Q6H PRN, Richrd Humbles, MD  ???  acetaminophen (TYLENOL) tablet 650 mg, 650 mg, Oral, Q6H PRN, Richrd Humbles, MD  ???  albuterol (PROVENTIL HFA;VENTOLIN HFA) 90 mcg/actuation inhaler 2 puff, 2 puff, Inhalation, Q4H PRN, Richrd Humbles, MD  ???  amLODIPine (NORVASC) tablet 5 mg, 5 mg, Oral, Daily, Richrd Humbles, MD, 5 mg at 04/26/21 1008  ???  atorvastatin (LIPITOR) tablet 40 mg, 40 mg, Oral, Daily, Richrd Humbles, MD, 40 mg at 04/26/21 1008  ???  bisacodyL (DULCOLAX) suppository 10 mg, 10 mg, Rectal, Daily PRN, Richrd Humbles, MD  ???  dexAMETHasone (DECADRON) tablet 4 mg, 4 mg, Oral, BID, Richrd Humbles, MD, 4 mg at 04/26/21 1007  ???  docusate sodium (COLACE) capsule 100 mg, 100 mg, Oral, BID, Richrd Humbles, MD, 100 mg at 04/26/21 1008  ???  ferrous sulfate tablet 325 mg, 325 mg, Oral, Every other day, Richrd Humbles, MD, 325 mg at 04/26/21 1007  ???  gabapentin (NEURONTIN) capsule 300 mg, 300 mg, Oral, BID, Richrd Humbles, MD, 300 mg at 04/26/21 1008  ???  HYDROmorphone (DILAUDID) tablet 2 mg, 2 mg, Oral, Q6H PRN, Richrd Humbles, MD, 2 mg at 04/26/21 1008  ???  MORPhine (MS CONTIN) 12 hr tablet 15 mg, 15 mg, Oral, Q12H, Richrd Humbles, MD, 15 mg at 04/26/21 1212  ???  MORPhine injection 2 mg, 2 mg, Intravenous, Q2H PRN, Richrd Humbles, MD  ???  ondansetron (ZOFRAN) injection 4 mg, 4 mg, Intravenous, Q4H PRN, Richrd Humbles, MD  ???  polyethylene glycol (MIRALAX) packet 17 g, 17 g, Oral, Daily, Richrd Humbles, MD, 17 g at 04/26/21 1212  ???  senna (SENOKOT) tablet 2 tablet, 2 tablet, Oral, Daily before lunch, Richrd Humbles, MD, 2 tablet at 04/26/21 1212  ???  SMOG ENEMA, 240 mL, Rectal, Daily PRN, Richrd Humbles, MD  ???  [START ON 04/27/2021] sodium chloride (NS) 0.9 % infusion, 75 mL/hr, Intravenous, Continuous, Rickey Barbara, FNP    ALLERGIES:  Allergies   Allergen Reactions   ??? Penicillins Hives and Other (See Comments)     Hives, welts, swelling profusely . Pretty severe reaction  Has patient had a PCN reaction causing immediate rash, facial/tongue/throat swelling, SOB or lightheadedness with hypotension: No  Has patient had a PCN reaction causing severe rash involving mucus membranes or skin necrosis: Yes  Has patient had a PCN reaction that required hospitalization: Yes  Has patient had a PCN reaction occurring within the last 10 years: No  If all of the above answers are NO, then may proceed with Cephalosporin use.         PHYSICAL EXAM:  Vitals:    04/26/21 1011   BP: 142/76   Pulse: 72   Resp: 17   Temp: 36.4 ??C (97.5 ??F)   SpO2: 97%       ASA Grade: ASA 3 - Patient with moderate systemic disease with functional limitations        Airway assessment: Class 3 - Can visualize soft palate     Pertinent Labs:     WBC   Date Value Ref Range Status   04/26/2021 3.7 3.6 - 11.2 10*9/L Final     HGB   Date Value Ref Range Status   04/26/2021 8.2 (L) 12.9 - 16.5 g/dL Final     HCT   Date Value Ref Range Status   04/26/2021 24.2 (L) 39.0 - 48.0 % Final     Platelet   Date Value Ref Range Status   04/26/2021 133 (L) 150 - 450 10*9/L Final     INR   Date Value Ref Range Status   04/25/2021 1.14  Final     Creatinine Whole Blood, POC   Date Value Ref Range Status   03/31/2021 1.2 0.8 - 1.4 mg/dL Final     Creatinine   Date Value Ref Range Status   04/26/2021 1.15 (H) 0.60 - 1.10 mg/dL Final       Imaging: CT chest abdomen pelvis dated 03/31/2021          Anticoaguation: No      ASSESSMENT & PLAN:    70 y.o. male with PMH of HTN, HLD, and metastatic prostate cancer with known osseous mets (s/p multiple rounds of chemotherapy and radiation), who presents with several month history of symptoms concerning for cervical myelopathy and several week history of worsening LUE weakness. MRI total spine from 9/14 shows diffuse metastatic disease with epidural thickening and spinal canal stenosis from C3-5 without cord signal change, and left foraminal invasion at C5-6. He is admitted for a planned OR 10/6 for PCDF C3-C6. He was found to have acute DVT in the left lower extremity. Per neurosurgery, he cannot be anticoagulated for 2 weeks post op. We are consulted for IVC filter placement.    - Recommend proceeding with IVC filter placement    -Anticipated procedure date:  04/26/2021  -Please keep patient NPO  -Ensure recent CBC, chem, pt-inr    Informed Consent:  This procedure and sedation has been fully reviewed with the patient/patient???s authorized representative. The risks, benefits and alternatives have been explained, and the patient/patient???s authorized representative has consented to the procedure.  --The patient will accept blood products in an emergent situation.  --The patient does not have a Do Not Resuscitate order in effect.    The patient was discussed with Dr. Orlando Penner.     Thank you for involving [place] in the care of this patient. Please page the VIR  consult pager [phone]) with further questions, concerns, or if new issues arise.  Launa Flight, April 26, 2021, 2:46 PM

## 2021-04-26 NOTE — Unmapped (Signed)
Per the request of Dr. Sandie Ano, called patient to inform him about an admission to [facility] in prep for surgery Thursday. Advised him he would be receiving a phone call from [facility] confirming his arrival. He verbalized agreement with the plan.    Updated Careers adviser.

## 2021-04-26 NOTE — Unmapped (Signed)
""  VENOUS ACCESS TEAM PROCEDURE    Order was placed for a \""PIV by Venous Access Team (VAT)\"".  Patient was assessed at bedside for placement of a PIV. PPE were donned per protocol.  Access was obtained. Blood return noted.  Dressing intact and device well secured.  Flushed with normal saline.  See LDA for details.  Pt advised to inform RN of any s/s of discomfort at the PIV site.    Workup / Procedure Time:  15 minutes       Care RN was notified.       Thank you,     Yolander Goodie M Briceyda Abdullah RN Venous Access Team""

## 2021-04-26 NOTE — Unmapped (Signed)
Team made aware to consult vascular surgery regarding IVC filter placement per VIR/vascular surgery IVC filter rotation.

## 2021-04-26 NOTE — Unmapped (Addendum)
NEUROSURGERY PROGRESS NOTE      Brief History of Present Illness  Kevin Cohen is a 70 y.o. male with PMH of HTN, HLD, and metastatic prostate cancer with known osseous mets (s/p multiple rounds of chemotherapy and radiation), who presents with several month history of symptoms concerning for cervical myelopathy and several week history of worsening LUE weakness. MRI total spine from 9/14 shows diffuse metastatic disease with epidural thickening and spinal canal stenosis from C3-5 without cord signal change, and left foraminal invasion at C5-6. He is admitted for a planned OR 10/6.     Subjective/Interval History  No events overnight.     Interval Imaging Reviewed  none    Neurological Assessment and Plan  **Metastatic adenocarcinoma (CMS-HCC)  *Prostate Cancer with mets to cervical spine, unclear staging/prognosis but pt had previously been in hospice and was DNR but has now recently become established with [facility] and is getting cancer treatment with ADT and degarelix. Full code.   -posted for OR 10/6 for PCDF C3-C6  -Consult Onc/RadOnc today for help with discussion of his prognosis    **Electrolyte status  Replete PRN      Non-neurologic problems being addressed  Anemia  - will discuss with Onc what other workup needed    CKD  - baseline Cr 1.2, today 1.3    HTN  - home meds    Daily Maintenance:  VTE chemoprophylaxis: hold for OR  IV Fluids: none  Bowel Regimen: Senna and Miralax  Foley status: none  Nutrition: Reg diet      Disposition: floor, OR 10/6        For questions regarding patients, please page SRN Inpatient Pager: [phone].    ___________________________________________________________________      Neurological Exam  [name]  Ox3  RUE 5 delt/bi/tri/we, 4 hg/io  LUE 2 delt/bi/tri, 3we/hg/io  BLE 5/5    Right fingertips numb      The patient's vitals, intake/output, labs, orders, and relevant imaging were reviewed for the last 24 hours.    Problem List  Active Problems:    Displacement of lumbar intervertebral disc without myelopathy    Metastatic adenocarcinoma (CMS-HCC)  Resolved Problems:    * No resolved hospital problems. *      I had a long conversation w/ the patient this evening.  I explained to the patient that I have discussed his case with several other spine surgeons, his oncologist, and his radiation oncologist.  He has extensive tumor burden, very elevated PSA - which may or may not be responding to hormonal therapy appropriately, and a poor KPS.    His exam notes that he has weakness in the LUE, primarily in the grip, deltoid, and biceps/brachioradialis.  He is not hyperreflexic nor does he have pathologic reflexes.  I explained to him that I suspect that his left arm weakness is related to the disease that is external to the canal, within the foramen and in proximity to the vertebral artery.  This would be very difficult to access.  I do not believe he would tolerate an extensive anterior approach / corpectomy without developing dysphagia among other complications.  A simple laminectomy could very likely result in kyphosis and a posterior cervical laminectomy / fusion would risk infection among other issues.  But I do not believe any of these approaches would improve his left arm weakness.      Consequently, I would favor proceeding with radiation therapy (discussed w/ Dr. Camelia Phenes).  I will follow him  with serial exams, imaging.   Given his acute DVT, I would favor keeping his filter in place while I follow him.  If a concern for myelopathy develops, then surgery may yet be a consideration.    Mattie Marlin, MD, MBA, MS, FAANS  Minimally Invasive and Complex Spine Surgery  Clinical Associate Professor of Neurosurgery  [facility]

## 2021-04-26 NOTE — Unmapped (Signed)
Oncology Consult Note    Requesting Attending Physician :  Illene Bolus Dipakkumar Upadh*  Service Requesting Consult : Neurosurgery [facility])  Reason for Consult: GOC/prognosis  Primary Oncologist: Whang    Assessment: Kevin Cohen is a 70 y.o. male with with PMH HTN, HLD, and metastatic prostate cancer with recent cord compression (T5-T6) and worsening left arm weakness, MRI total spine from 9/14 shows diffuse metastatic disease with epidural thickening and spinal canal stenosis from C3-5 without cord signal change, and left foraminal invasion at C5-6. On hospice 04/2020 - until recently when established with Dr Philomena Course and started ADT +abi/pred. Admitted for NSG intervention. Oncology was consulted for discussion of prognosis/GOC.     He has high risk metastatic prostate cancer given extent of disease. However, he has been off therapy for a year and testosterone was not suppressed. He just recently restarted ADT and was also started on abiraterone. In the post-chemotherapy setting (de Patsy Lager al, NEJM 2011) there is OS of 14.8 vs 10.9 months for abi/pred vs pred alone group with a HR of 0.65 (95% CI 0.54-0.77). Given his quality of life and potential for worsening of his performance status decision has been made with Dr. Philomena Course to proceed with NSG intervention/radiation for stabilization and prevention of worsening symptoms, even if low likelihood he will regain full function given duration of symptoms.    In regards to his anemia, he has had worsening anemia, stable thrombocytopenia for several months. B12 normal and iron studies consistent with anemia of chronic disease. Suspect anemia of chronic inflammation vs possible bone marrow invasion from prostate cancer (less likely given thrombocytopenia appears to have been relatively stable, was 129 in 2019).    Recommendations:   -Recommend proceeding with NSG intervention  -Continue abi/pred 5mg  (or dex based on NSG preference). Once dex is tapered should resume prednisone 5mg  daily as abiraterone requires concomitant prednisone use.    -Please repeat PSA and check fibrinogen  -Originally had follow-up with Dr Philomena Course 10/20 for Lupron, if he is inpatient or at rehab, can receive Lupron 7.5mg  monthly until he is able to come to clinic for q3 month Lupron.    This patient has been staffed with Dr. Angelica Chessman. These recommendations were discussed with the primary team.     Please contact the oncology consult fellow at [phone] with any further questions.    Sherin Quarry, MD  Oncology Fellow    TEACHING PHYSICIAN ATTESTATION    I was present with Dr. Modesta Messing, fellow during the visit.  I discussed the findings, assessment and plan with her and agree with the findings and plan as documented in her note.    Barbie Haggis, MD  Attending Physician     -------------------------------------------------------------    HPI: Kevin Cohen is a 70 y.o. male and who is being seen at the request of Cheerag Dipakkumar Illene Labrador* for evaluation of metastatic prostate cancer.    Oncologic history as below, but in brief, diagnosed with prostate cancer in 02/2018, received radiation, did not tolerate ADT, 10/20 metastatic disease. Received 6 cycle of docetaxal but progressed 03/2020. Elected hospice 05/2020, admitted 11/2020 for T6-T7 cord compression.  Had worsening left arm weakness, saw Dr Sandie Ano with NSG at [facility] 03/31/21 due to worsening lower body weakness/numbness.  MRI total spine from 9/14 shows diffuse metastatic disease with epidural thickening and spinal canal stenosis from C3-5 without cord signal change, and left foraminal invasion at C5-6. Referred him to Dr Philomena Course with [facility]. Was seen 9/15 and started  on ADT and abi/pred. PSA 1154 and testosterone 167.    He is admitted for neurosurgical intervention (C3-6 posterior decompression and instrumented fusion) scheduled for 04/27/2021.    Patient reports he has had intermittent hot flashes since initial ADT in 2019 but has not had increased symptoms since restarting ADT and abi/pred. Overall he continues to have left arm weakness x several months, abdominal pain and generalized bone pain. No urinary symptoms, has constipation due to opioids. Denies any side effects from starting treatment and affirms his decision to continue on treatment and proceed with surgery. His goal is to be able to walk again.     Review of Systems: All positive and pertinent negatives are noted in the HPI; a 10 system review of systems was otherwise negative except as noted in HPI.    Oncologic History:  Oncology History Overview Note   - 06/19: Found to have PSA 72 with PCP. Referred to urology.  - 01/01/18: Received degarelix loading dose with urology  - 02/28/18: Prostate bipsy with pathology showed prostate adenocarcinoma with androgen deprivation treatment effect. Adenocarcinoma is present in all core fragments with involvement of 50% or more of each core. Perineural invasion is present. Gleason grade not reliably assessed due to ADT effect.  - 03/11/18: Staging PET: Enlarged left external iliac node measuring 1.4 cm, [name], [name], faint sclerosis in the   right anterior sixth rib, neither with significant metabolic activity.  - 03/2018: Underwent definitive XRT, finished 07/2018.  - 04/18/18: PSA 3.8  - 08/22/18: PSA 0.83  - 08/20: Declined additional ADT due to vasomotor symptoms  - 04/14/19: PSA 24  - 04/28/19: CT CAP shows enlarging sclerotic focus along the posteroinferior T6 vertebral body, [name], [name], T6 and T1 vertebral body correspond to sclerotic lesions on comparison CT and consistent with skeletal metastasis.   - 05/13/19: PSA 33  - 05/15/19: Started docetaxel  - 06/04/19: PSA 53  - 06/26/19: PSA 14  - 08/17/19: PSA 7  - 09/07/19: PSA 5.5  - 10/05/19: PSA 6.0. Completed 6 cycles of docetaxel.  - 11/03/19: PSA 4.3  - 01/05/20: PSA 5.5  - 01/06/20: Found on renal US to have posterior bladder wall mass, declined urology referral or additional workup.  - 04/06/20: PSA 11.9  - 09/21: Stefani Dama but stopped after 2-3 weeks 2/2 side effects.  - 05/06/20: PSA 14  - 10/21: Declined further treatment and elected hospice.  - 5/22-31/22: Admitted for T6-T7 cord compression. Started steroids and treated with XRT, finished 12/26/20. Discharged to SNF.  - 12/13/20: PSA 220  - 01/24/21: See by Dr. Cathie Hoops with [facility] oncology. Noted lower body numbness and BLE weakness. Recommended restarting ADT, possible oral chemotherapy. Declined treatment.  - 03/31/21: Seen by Dr. Sandie Ano with [facility]. Ordered MRI spine and CT CAP, [name], [name], [name], indeterminate 0.4 cm left lower lobe groundglass nodule.  - 04/05/21: MRI C/T/L Spine with possible discitis/osteomyelitis C5-T1 with intraosseous abscess and surrounding epidural and paravertebral soft tissue thickening, diffuse  epidural and osseous mets, and additional spondylosis of several segments with associated moderate spinal canal narrowing (C3-C5, C7-T1, T3-T4) and severe narrowing C5-C7.     Prostate cancer metastatic to bone (CMS-HCC)   04/05/2021 Initial Diagnosis    Malignant neoplasm of prostate metastatic to bone (CMS-HCC)     04/07/2021 -  Cancer Staged    Staging form: Prostate, AJCC 8th Edition  - Clinical: Stage IVB (cTX, cN1, pM1b) - Signed by Maurie Boettcher, MD on 04/07/2021       04/10/2021 Endocrine/Hormone Therapy    OP DEGARELIX  Plan Provider: Maurie Boettcher, MD     05/11/2021 Endocrine/Hormone Therapy OP LEUPROLIDE (LUPRON) 22.5 MG EVERY 3 MONTHS  Plan Provider: Maurie Boettcher, MD         No past medical history on file.    No past surgical history on file.    No family history on file.     No family history of cancer     Social History     Socioeconomic History    Marital status: Single     Divorced, lives with daughter in [place]. Has 2 daughters and 2 sons all in [place].  Smoked 1/2 ppd x 20 years, quit several years ago but continues to intermittently smoked cigars. History of heavy alcohol use, 2 40oz beers/day quit several years ago. Previous cocaine use.    Social History     Social History Narrative    Not on file       Allergies: is allergic to penicillins.    Medications:   Meds:   abiraterone  1,000 mg Oral Daily    amLODIPine  5 mg Oral Daily    atorvastatin  40 mg Oral Daily    dexAMETHasone  4 mg Oral BID    docusate sodium  100 mg Oral BID    ferrous sulfate  325 mg Oral Every other day    gabapentin  300 mg Oral BID    MORPhine  15 mg Oral Q12H    polyethylene glycol  17 g Oral Daily    senna  2 tablet Oral Daily before lunch     Continuous Infusions:   [START ON 04/27/2021] sodium chloride       PRN Meds:.acetaminophen, acetaminophen, albuterol, bisacodyL, HYDROmorphone, MORPhine injection, ondansetron, SMOG    Objective:   Vitals: Temp:  [36.4 ??C (97.5 ??F)-37.1 ??C (98.8 ??F)] 36.4 ??C (97.5 ??F)  Heart Rate:  [72-88] 72  Resp:  [17-20] 17  BP: (135-142)/(76-96) 142/76  MAP (mmHg):  [87-95] 95  SpO2:  [96 %-97 %] 97 %  I/O this shift:  In: -   Out: 400 [Urine:400]    Physical Exam:  BP 142/76  - Pulse 72  - Temp 36.4 ??C (97.5 ??F) (Oral)  - Resp 17  - SpO2 97%    General appearance - alert, well appearing, and in no distress   Mental status - alert, oriented to person, place, and time   Eyes - pupils equal and reactive, extraocular eye movements intact   Nose - normal and patent, no erythema, discharge or polyps   Mouth - mucous membranes moist, pharynx normal without lesions   Neck - supple Lymphatics - no palpable lymphadenopathy   Pulmonary - CTAB, breathing comfortably on room air   Cardiovascular- RRR, no m/r/g  Gastrointestinal - Soft, non-tender, nondistended  Neurological - +LUE 0/5  Musculoskeletal - no joint tenderness, deformity or swelling   Extremities - peripheral pulses  normal, no pedal edema, no clubbing or cyanosis   Skin - normal coloration and turgor, no rashes, no suspicious skin lesions noted     ECOG Performance Status: 3 - Capable of only limited selfcare, confined to bed or chair more than 50% of waking hours    Test Results  Lab Results   Component Value Date    WBC 3.7 04/26/2021    HGB 8.2 (L) 04/26/2021    HCT 24.2 (L) 04/26/2021    PLT 133 (L) 04/26/2021     Lab Results   Component Value Date    NA 137 04/26/2021    K 4.4 04/26/2021    CL 105 04/26/2021    CO2 26.0 04/26/2021    BUN 19 04/26/2021    CREATININE 1.15 (H) 04/26/2021    GLU 127 04/26/2021    CALCIUM 9.0 04/26/2021    MG 2.0 04/25/2021    PHOS 3.1 04/25/2021     Lab Results   Component Value Date    BILITOT 0.5 04/26/2021    PROT 6.2 04/26/2021    ALBUMIN 3.3 (L) 04/26/2021    ALT <7 (L) 04/26/2021    AST 16 04/26/2021    ALKPHOS 899 (H) 04/26/2021     Lab Results   Component Value Date    INR 1.14 04/25/2021    APTT 26.4 04/25/2021       Imaging: Radiology studies were personally reviewed

## 2021-04-26 NOTE — Unmapped (Signed)
VENOUS ACCESS TEAM PROCEDURE    Order was placed for a PIV by Venous Access Team (VAT).  Transport en route to take pt to [facility]. Requested that care RN place new PIV order once pt is back from imaging.    Workup / Procedure Time:  15 minutes       Amber RN was notified.       Thank you,     Jacqulyn Liner RN Venous Access Team

## 2021-04-26 NOTE — Telephone Encounter (Signed)
Received a faxed dosimetry request from [facility]. Placed request on cart for dosimetry.

## 2021-04-27 ENCOUNTER — Telehealth: Payer: Self-pay | Admitting: Radiation Oncology

## 2021-04-27 ENCOUNTER — Ambulatory Visit: Admit: 2021-04-27 | Discharge: 2021-05-03 | Payer: MEDICARE

## 2021-04-27 ENCOUNTER — Ambulatory Visit: Admit: 2021-04-27 | Discharge: 2021-05-06 | Payer: MEDICARE

## 2021-04-27 ENCOUNTER — Ambulatory Visit: Admit: 2021-04-27 | Discharge: 2021-04-28 | Payer: MEDICARE

## 2021-04-27 ENCOUNTER — Ambulatory Visit: Admit: 2021-04-27 | Discharge: 2021-04-28

## 2021-04-27 ENCOUNTER — Ambulatory Visit: Admit: 2021-04-27 | Payer: MEDICARE

## 2021-04-27 LAB — CBC
HEMATOCRIT: 24.6 % — ABNORMAL LOW (ref 39.0–48.0)
HEMATOCRIT: 24.2 % — ABNORMAL LOW (ref 39.0–48.0)
HEMOGLOBIN: 8.2 g/dL — ABNORMAL LOW (ref 12.9–16.5)
HEMOGLOBIN: 8.2 g/dL — ABNORMAL LOW (ref 12.9–16.5)
MEAN CORPUSCULAR HEMOGLOBIN CONC: 33.6 g/dL (ref 32.0–36.0)
MEAN CORPUSCULAR HEMOGLOBIN CONC: 33.7 g/dL (ref 32.0–36.0)
MEAN CORPUSCULAR HEMOGLOBIN: 30.9 pg (ref 25.9–32.4)
MEAN CORPUSCULAR HEMOGLOBIN: 30.9 pg (ref 25.9–32.4)
MEAN CORPUSCULAR VOLUME: 91.9 fL (ref 77.6–95.7)
MEAN CORPUSCULAR VOLUME: 91.8 fL (ref 77.6–95.7)
MEAN PLATELET VOLUME: 8.2 fL (ref 6.8–10.7)
MEAN PLATELET VOLUME: 8.4 fL (ref 6.8–10.7)
PLATELET COUNT: 143 10*9/L — ABNORMAL LOW (ref 150–450)
PLATELET COUNT: 137 10*9/L — ABNORMAL LOW (ref 150–450)
RED BLOOD CELL COUNT: 2.67 10*12/L — ABNORMAL LOW (ref 4.26–5.60)
RED BLOOD CELL COUNT: 2.64 10*12/L — ABNORMAL LOW (ref 4.26–5.60)
RED CELL DISTRIBUTION WIDTH: 16.4 % — ABNORMAL HIGH (ref 12.2–15.2)
RED CELL DISTRIBUTION WIDTH: 16.5 % — ABNORMAL HIGH (ref 12.2–15.2)
WBC ADJUSTED: 3.6 10*9/L (ref 3.6–11.2)
WBC ADJUSTED: 3.6 10*9/L (ref 3.6–11.2)

## 2021-04-27 LAB — BASIC METABOLIC PANEL
ANION GAP: 7 mmol/L (ref 5–14)
BLOOD UREA NITROGEN: 21 mg/dL (ref 9–23)
BUN / CREAT RATIO: 20
CALCIUM: 9 mg/dL (ref 8.7–10.4)
CHLORIDE: 103 mmol/L (ref 98–107)
CO2: 25 mmol/L (ref 20.0–31.0)
CREATININE: 1.06 mg/dL
EGFR CKD-EPI (2021) MALE: 76 mL/min/{1.73_m2} (ref >=60)
GLUCOSE RANDOM: 124 mg/dL (ref 70–179)
POTASSIUM: 4 mmol/L (ref 3.4–4.8)
SODIUM: 135 mmol/L (ref 135–145)

## 2021-04-27 LAB — PROTIME-INR
INR: 1.08
PROTIME: 12.3 s (ref 9.8–12.8)

## 2021-04-27 LAB — APTT
APTT: 25.8 s (ref 25.1–36.5)
HEPARIN CORRELATION: <0.2

## 2021-04-27 LAB — PSA: PROSTATE SPECIFIC ANTIGEN: 983.03 ng/mL — ABNORMAL HIGH (ref 0.00–4.00)

## 2021-04-27 MED ADMIN — MORPhine injection 2 mg: 2 mg | INTRAVENOUS | @ 13:00:00 | Stop: 2021-05-09

## 2021-04-27 MED ADMIN — gabapentin (NEURONTIN) capsule 300 mg: 300 mg | ORAL | @ 13:00:00

## 2021-04-27 MED ADMIN — apixaban (ELIQUIS) tablet 5 mg: 5 mg | ORAL | @ 18:00:00

## 2021-04-27 MED ADMIN — dexAMETHasone (DECADRON) tablet 4 mg: 4 mg | ORAL | @ 01:00:00

## 2021-04-27 MED ADMIN — MORPhine (MS CONTIN) 12 hr tablet 15 mg: 15 mg | ORAL | @ 05:00:00 | Stop: 2021-05-09

## 2021-04-27 MED ADMIN — MORPhine (MS CONTIN) 12 hr tablet 15 mg: 15 mg | ORAL | @ 16:00:00 | Stop: 2021-05-09

## 2021-04-27 MED ADMIN — docusate sodium (COLACE) capsule 100 mg: 100 mg | ORAL | @ 01:00:00

## 2021-04-27 MED ADMIN — senna (SENOKOT) tablet 2 tablet: 2 | ORAL | @ 18:00:00 | Stop: 2021-05-01

## 2021-04-27 MED ADMIN — HYDROmorphone (DILAUDID) tablet 2 mg: 2 mg | ORAL | @ 23:00:00 | Stop: 2021-05-09

## 2021-04-27 MED ADMIN — Abiraterone (ZYTIGA) tablet 250 mg **Patient Supplied**: 1000 mg | ORAL | @ 13:00:00

## 2021-04-27 MED ADMIN — dexAMETHasone (DECADRON) tablet 4 mg: 4 mg | ORAL | @ 13:00:00

## 2021-04-27 MED ADMIN — amLODIPine (NORVASC) tablet 5 mg: 5 mg | ORAL | @ 13:00:00

## 2021-04-27 MED ADMIN — MORPhine injection 2 mg: 2 mg | INTRAVENOUS | @ 19:00:00 | Stop: 2021-05-09

## 2021-04-27 MED ADMIN — gabapentin (NEURONTIN) capsule 300 mg: 300 mg | ORAL | @ 01:00:00

## 2021-04-27 MED ADMIN — docusate sodium (COLACE) capsule 100 mg: 100 mg | ORAL | @ 13:00:00

## 2021-04-27 MED ADMIN — HYDROmorphone (DILAUDID) tablet 2 mg: 2 mg | ORAL | @ 01:00:00 | Stop: 2021-05-09

## 2021-04-27 MED ADMIN — atorvastatin (LIPITOR) tablet 40 mg: 40 mg | ORAL | @ 13:00:00

## 2021-04-27 NOTE — Unmapped (Signed)
Physical Medicine and Rehab  Consult Note    Requesting Attending Physician: Illene Bolus Dipakkumar Upadh*  Service Requesting Consult: Neurosurgery [facility])    ASSESSMENT / RECOMMENDATIONS:     Kevin Cohen is a 70 y.o. male  with a past medical history of metastatic prostate cancer and known bony metastases s/p chemo+RT admitted for cervical myelopathy with a neurologic level of C5 in the setting of incomplete SCI.    The patient is seen in consultation for evaluation of rehabilitation needs.    Functional Impairment  LUE weakness  Suspected neurogenic bowel + bladder  Deconditioning/cervical myelopathy  - Please continue to have patient work with PT and OT to maximize functional status with mobility and ADLs.   - Work on ROM of all joints to prevent contracture  - Work on appropriate positioning of LUE using wrist/hand splint to prevent contracture  - Use resting dorsiflexion splints  - Encourage OOB To chair with goal of 6 hours per day  - Use abdominal binder/thigh high compression socks for BP support if needed as at risk of orthostatic hypotension  - For neurogenic bladder, please check PVRs after spontaneous void. If >150 ml, please straight cath. Goal volumes should be <500 ml with each void  - For neurogenic bowel, please work on daily bowel program. Current regimen includes Colace 100 mg BID, senna 2 tabs daily, Miralax. Please initiate daily suppository if no regular bowel movement.   - For pain, can increase gabapentin to 300 mg TID if needed    Functional Goals  Improve mobility to baseline  Improve ADLs to baseline  Increase LUE strength    Rehabilitation Plan of Care  - Please place PT/OT orders for full functional assessment regarding AIR tolerability (3 hours/5 days per week)  - Patient has complex rehab, nursing, and medical needs and may become appropriate for Acute Inpatient Rehabilitation with regular therapy participation / demonstrated progress, ability to tolerate single therapist sessions (not co-treatment sessions with multiple therapists), confirmation of necessary assistance at home and determination of oncology treatment plan.  In AIR the patient would be expected to tolerate minimum of 3 hrs of therapy daily, 5x/week.  - Due to the shared treatment spaces inherent to inpatient rehabilitation, [place] [facility] is now requiring a negative COVID-19 test prior to admission.  The patient has not yet undergone COVID-19 testing.  Please order a COVID-19 test at this time to rule out asymptomatic infection.  The patient cannot be admitted to [facility] without a resulted test.    *This PM&R consult does not guarantee that patient has been accepted to [facility], but can be helpful to guide patient progression.*  In order for the patient to be considered for admission to [facility] inpatient rehab, the case manager must give the patient / family choice in post-acute discharge options AND if the patient desires [facility], place a referral order to [facility] inpatient rehab. A referral for [facility] inpatient rehab has not been received. The case manager may contact the Rehab Intake Coordinator with questions about acceptance to [facility],  bed availability and insurance authorization.     Corrin Parker, MD    Thank you for this consult.  Please contact the PM&R consult pager [phone]) for questions regarding these recommendations.  For questions of bed availability at [facility], contact the Intake Office at [phone].      SUBJECTIVE:     Reason for Consult: Patient seen in consultation at the request of [name] for evaluation  of rehabilitation needs and recommendations.  Chief Complaint: Deconditioning  History of Present Illness: Kevin Cohen is a 70 y.o. male with a past medical history of metastatic prostate cancer and known bony metastases s/p chemo+RT admitted for cervical myelopathy.    Per review of EMR, he presented to neurosurgery with symptoms concerning for cervical myelopathy and LUE weakness and underwent a C3-6 PSIF on 10/6. At baseline he uses a WC for mobility but PTA denied any issues with bladder. He did have an aggressive bowel regimen. Since admission he has met with rad onc and med onc and recommended for ongoing treatment.    Today patient underwent RT earlier today. He had a IVC filter placed yesterday. He states that surgery is not planned for this admission.    In additional history, he states that he lives at home with his daughter who works from home. He is mod-I for ADLs at home and uses a RW or a WC for household mobility. He reports ongoing aching pain in his LUE that has been present for a few weeks; he also reports hypersensitivity in the LUE. He states that he has had limited mobility, proximal > distal, of the LUE for at least 3 weeks and has been relying on his RUE for most activity. He does have numbness/tingling in the RUE in digits 1-3. He also has BLE weakness, resulting in the use of mobility aids, which has been present since May/June 2022. He denies any bladder issues such as leakage or reduced sensation but admits he often has a high volume. He has had some bowel urgency and altered sensation.  \    Prior Functional Status:  Required some assistance for mobility and/or activities of daily living.         Current Functional Status: Therapy notes NOT YET COMPLETE    Activities of Daily Living:                  Mobility:                    Cognition, Swallow, Speech:                 Assistive Devices:      Precautions:  Safety Interventions  Safety Interventions: fall reduction program maintained, lighting adjusted for tasks/safety, low bed, nonskid shoes/slippers when out of bed    Medical / Surgical History:   No past medical history on file.  No past surgical history on file.     Cancer History:  Oncology History Overview Note   - 06/19: Found to have PSA 72 with PCP. Referred to urology.  - 01/01/18: Received degarelix loading dose with urology  - 02/28/18: Prostate bipsy with pathology showed prostate adenocarcinoma with androgen deprivation treatment effect. Adenocarcinoma is present in all core fragments with involvement of 50% or more of each core. Perineural invasion is present. Gleason grade not reliably assessed due to ADT effect.  - 03/11/18: Staging PET: Enlarged left external iliac node measuring 1.4 cm, [name], [name], faint sclerosis in the   right anterior sixth rib, neither with significant metabolic activity.  - 03/2018: Underwent definitive XRT, finished 07/2018.  - 04/18/18: PSA 3.8  - 08/22/18: PSA 0.83  - 08/20: Declined additional ADT due to vasomotor symptoms  - 04/14/19: PSA 24  -  04/28/19: CT CAP shows enlarging sclerotic focus along the posteroinferior T6 vertebral body, [name], [name], T6 and T1 vertebral body correspond to sclerotic lesions on comparison CT and consistent with skeletal metastasis.   - 05/13/19: PSA 33  - 05/15/19: Started docetaxel  - 06/04/19: PSA 53  - 06/26/19: PSA 14  - 08/17/19: PSA 7  - 09/07/19: PSA 5.5  - 10/05/19: PSA 6.0. Completed 6 cycles of docetaxel.  - 11/03/19: PSA 4.3  - 01/05/20: PSA 5.5  - 01/06/20: Found on renal US to have posterior bladder wall mass, declined urology referral or additional workup.  - 04/06/20: PSA 11.9  - 09/21: Stefani Dama but stopped after 2-3 weeks 2/2 side effects.  - 05/06/20: PSA 14  - 10/21: Declined further treatment and elected hospice.  - 5/22-31/22: Admitted for T6-T7 cord compression. Started steroids and treated with XRT, finished 12/26/20. Discharged to SNF.  - 12/13/20: PSA 220  - 01/24/21: See by Dr. Cathie Hoops with [facility] oncology. Noted lower body numbness and BLE weakness. Recommended restarting ADT, possible oral chemotherapy. Declined treatment.  - 03/31/21: Seen by Dr. Sandie Ano with [facility]. Ordered MRI spine and CT CAP, [name], [name], [name], indeterminate 0.4 cm left lower lobe groundglass nodule.  - 04/05/21: MRI C/T/L Spine with possible discitis/osteomyelitis C5-T1 with intraosseous abscess and surrounding epidural and paravertebral soft tissue thickening, diffuse epidural and osseous mets, and additional spondylosis of several segments with associated moderate spinal canal narrowing (C3-C5, C7-T1, T3-T4) and severe narrowing C5-C7.     Prostate cancer metastatic to bone (CMS-HCC)   04/05/2021 Initial Diagnosis    Malignant neoplasm of prostate metastatic to bone (CMS-HCC)     04/07/2021 -  Cancer Staged    Staging form: Prostate, AJCC 8th Edition  - Clinical: Stage IVB (cTX, cN1, pM1b) - Signed by Maurie Boettcher, MD on 04/07/2021       04/10/2021 - 04/10/2021 Endocrine/Hormone Therapy    OP DEGARELIX  Plan Provider: Maurie Boettcher, MD     05/11/2021 Endocrine/Hormone Therapy    OP LEUPROLIDE (LUPRON) 22.5 MG EVERY 3 MONTHS  Plan Provider: Maurie Boettcher, MD           Social History:             Family History: Reviewed and non-contributory to rehab needs  family history is not on file.    Allergies:   Penicillins    Medications:   Scheduled   ??? Abiraterone (ZYTIGA) tablet 250 mg **Patient Supplied** Daily   ??? amLODIPine (NORVASC) tablet 5 mg Daily   ??? apixaban (ELIQUIS) tablet 5 mg BID   ??? atorvastatin (LIPITOR) tablet 40 mg Daily   ??? dexAMETHasone (DECADRON) tablet 4 mg BID   ??? docusate sodium (COLACE) capsule 100 mg BID   ??? ferrous sulfate tablet 325 mg Every other day   ??? gabapentin (NEURONTIN) capsule 300 mg BID   ??? MORPhine (MS CONTIN) 12 hr tablet 15 mg Q12H   ??? polyethylene glycol (MIRALAX) packet 17 g Daily   ??? senna (SENOKOT) tablet 2 tablet Daily before lunch     PRN acetaminophen, 650 mg, Q6H PRN  acetaminophen, 650 mg, Q6H PRN  albuterol, 2 puff, Q4H PRN  bisacodyL, 10 mg, Daily PRN  HYDROmorphone, 2 mg, Q6H PRN  MORPhine injection, 2 mg, Q2H PRN  ondansetron, 4 mg, Q4H PRN  SMOG, 240 mL, Daily PRN      Continuous Infusions      Review of Systems:    General ROS:   Full 10 systems reviewed and neg, unless noted in HPI  OBJECTIVE:     Vitals:  Temp:  [35.6 ??C (96.1 ??F)-37 ??C (98.6 ??F)] 36.5 ??C (97.7 ??F)  Heart Rate:  [64-75] 68  Resp:  [10-18] 18  BP: (123-147)/(76-86) 142/77  MAP (mmHg):  [94-97] 96  SpO2:  [94 %-100 %] 98 %    Physical Exam:    GEN: Lying in bed in NAD.    HEENT: Atraumatic. Normocephalic. Moist mucous membranes.  RESP: NWOB on RA. Lungs CTAB.  CV: RRR, no edema  GI: abd soft, NTND   GU: No Foley   SKIN: no rashes or ecchymoses on exposed skin    NEURO:  Mental Status: A&Ox3, attention full, speech fluid and coherent, follows commands without R/L confusion  Sensory: BUE and BLE sensation reduced/altered, starting at C6  Motor:     RUE/LUE: shoulder abd 5/0, biceps 5/1, triceps 5/0, wrist extension 4/3, hand grasp 4/4, finger abduction 2/2    RLE/LLE: hip flexion 4/4, knee extension 4/4, DF 5/5, 1st toe extension 5/5 PF 4/4    PSYCH: mood euthymic, affect appropriate, thought process logical     Labs and Diagnostic Studies: Personally reviewed   CBC -   Results in Past 2 Days  Result Component Current Result   WBC 3.6 (04/27/2021)    3.6 (04/27/2021)   RBC 2.67 (L) (04/27/2021)    2.64 (L) (04/27/2021)   HGB 8.2 (L) (04/27/2021)    8.2 (L) (04/27/2021)   HCT 24.6 (L) (04/27/2021)    24.2 (L) (04/27/2021)   MCV 91.9 (04/27/2021)    91.8 (04/27/2021)   MCH 30.9 (04/27/2021)    30.9 (04/27/2021)   MCHC 33.6 (04/27/2021)    33.7 (04/27/2021)   MPV 8.2 (04/27/2021)    8.4 (04/27/2021)   Platelet 143 (L) (04/27/2021)    137 (L) (04/27/2021)     BMP -   Results in Past 2 Days  Result Component Current Result   Sodium 135 (04/27/2021)   Potassium 4.0 (04/27/2021)   Chloride 103 (04/27/2021)   CO2 25.0 (04/27/2021)   BUN 21 (04/27/2021)   Creatinine 1.06 (04/27/2021)   EST.GFR (MDRD) Not in Time Range   Glucose 124 (04/27/2021)     Coagulation -   Results in Past 2 Days  Result Component Current Result   PT 12.3 (04/27/2021)   INR 1.08 (04/27/2021)   APTT 25.8 (04/27/2021)     Cardiac markers -   No results found for requested labs within last 2 days.     LFT's -   Results in Past 2 Days  Result Component Current Result   Albumin 3.3 (L) (04/26/2021)   ALT <7 (L) (04/26/2021)   AST 16 (04/26/2021)   Alkaline Phosphatase 899 (H) (04/26/2021)   Total Bilirubin 0.5 (04/26/2021)   Bilirubin, Direct Not in Time Range    Not in Time Range       Radiology Results: Personally reviewed MRI cervical/thoracic/lumbar spine from 04/05/21. Notable for diffuse metastases in bony spine; agree with report as copied/pasted:    IMPRESSION:  -Possible discitis osteomyelitis involving C5-T1 with 4.2 cm intraosseous abscess and surrounding epidural and  paravertebral soft tissue thickening. Alternatively, extensive tumor / necrosis as above.  Please correlate with clinical findings.  ??  -Epidural soft tissue thickening and enhancement extends from the skull base to the visualized lumbar spine most consistent with diffuse epidural metastases. No epidural abscess.  ??  -Diffuse osseous metastatic disease throughout the vertebral bodies and posterior elements and involving the sacrum. This appears to have changed significantly compared to the prior thoracic and lumbar spine MRI dated 12/10/2020  ??  -Spondylosis of the cervical spine with moderate spinal canal narrowing from C3-C5, C7-T1 and severe spinal canal narrowing at C5-C7 exacerbated by the underlying process.  ??  -Spondylosis of the thoracic spine with moderate T3-T4 and mild T8-T9, T11-T12 spinal canal narrowing. Foraminal narrowing diffusely, worst at T5-6 and T6-7.  ??  -Limited evaluation of the mid and lower lumbar spine due to susceptibility artifact from posterior fusion hardware extending from L4 to S1. Mild spinal canal narrowing at L2-L3.          For coding purposes:   - This patient was seen by the provider Corrin Parker, MD).  - This encounter should be coded as a an inpatient consultation.

## 2021-04-27 NOTE — Unmapped (Cosign Needed)
NEUROSURGERY PROGRESS NOTE      Brief History of Present Illness  Kevin Cohen is a 70 y.o. male with PMH of HTN, HLD, and metastatic prostate cancer with known osseous mets (s/p multiple rounds of chemotherapy and radiation), who presents with several month history of symptoms concerning for cervical myelopathy and several week history of worsening LUE weakness. MRI total spine from 9/14 shows diffuse metastatic disease with epidural thickening and spinal canal stenosis from C3-5 without cord signal change, and left foraminal invasion at C5-6. He is admitted for a planned OR 10/6 but holding off on surgery at this point given anatomic challenges.     Subjective/Interval History  IVC filter placed in the evening. Pt feeling well.     Interval Imaging Reviewed  none    Neurological Assessment and Plan  **Metastatic adenocarcinoma (CMS-HCC)  *Prostate Cancer with mets to cervical spine, unclear staging/prognosis but pt had previously been in hospice and was DNR but has now recently become established with [facility] and is getting cancer treatment with ADT and degarelix. Full code.   -Appreciate Onc/RadOnc consultations    **Electrolyte status  Replete PRN      Non-neurologic problems being addressed  Anemia  - chronic inflammatory and iron deficient  - iron supplements as tolerated.     CKD  - baseline Cr 1.2, 1.3 on admit    HTN  - stable on home meds    Daily Maintenance:  VTE chemoprophylaxis: Lovenox  IV Fluids: none  Bowel Regimen: Senna and Miralax  Foley status: none  Nutrition: Reg diet      Disposition: floor, full code, home vs AIR        For questions regarding patients, please page [facility] Inpatient Pager: [phone].    ___________________________________________________________________      Neurological Exam  [name]  Ox3  RUE 5 delt/bi/tri/we, 4 hg/io  LUE 2 delt/bi/tri, 3we/4hg/io  BLE 5/5    Right fingertips numb      The patient's vitals, intake/output, labs, orders, and relevant imaging were reviewed for the last 24 hours.    Patient Active Problem List   Diagnosis   ??? Prostate cancer metastatic to bone (CMS-HCC)   ??? Displacement of lumbar intervertebral disc without myelopathy   ??? Metastatic adenocarcinoma (CMS-HCC)   ??? DVT (deep venous thrombosis) (CMS-HCC)   ??? Anemia   ??? Cervical myelopathy with cervical radiculopathy (CMS-HCC)   ??? HTN (hypertension)   ??? CKD (chronic kidney disease)

## 2021-04-27 NOTE — Unmapped (Cosign Needed)
[facility] INTERVENTIONAL RADIOLOGY - Operative Note     VIR Post-Procedure Note    Procedure Name: IVC filter placement     Pre-Op Diagnosis: DVT and need for surgery     Post-Op Diagnosis: Same as pre-operative diagnosis    VIR Providers    Attending: Dr. Melynda Ripple  Resident: Raquel James, MD  Description of procedure: Successful placement of infrarenal denali IVC filter. Right groin access closed with manual pressure.     Estimated Blood Loss: approximately <5 mL  Complications: None    See detailed procedure note with images in PACS [facility]).    The patient tolerated the procedure well without incident or complication and left the room in stable condition.    Raquel James, MD  04/26/2021 5:23 PM

## 2021-04-27 NOTE — Telephone Encounter (Signed)
I spoke with Dr. Colon Branch at [facility] in rad onc regarding this patient who is currently inpt with C3 disease and needing palliative radiotherapy. We will send full dose records on this patient so they have access to his prior treatment in our facility.

## 2021-04-28 ENCOUNTER — Ambulatory Visit: Admit: 2021-04-28 | Discharge: 2021-04-29

## 2021-04-28 LAB — CBC
HEMATOCRIT: 23.7 % — ABNORMAL LOW (ref 39.0–48.0)
HEMOGLOBIN: 8 g/dL — ABNORMAL LOW (ref 12.9–16.5)
MEAN CORPUSCULAR HEMOGLOBIN CONC: 33.8 g/dL (ref 32.0–36.0)
MEAN CORPUSCULAR HEMOGLOBIN: 31 pg (ref 25.9–32.4)
MEAN CORPUSCULAR VOLUME: 91.6 fL (ref 77.6–95.7)
MEAN PLATELET VOLUME: 7.5 fL (ref 6.8–10.7)
PLATELET COUNT: 147 10*9/L — ABNORMAL LOW (ref 150–450)
RED BLOOD CELL COUNT: 2.59 10*12/L — ABNORMAL LOW (ref 4.26–5.60)
RED CELL DISTRIBUTION WIDTH: 16.3 % — ABNORMAL HIGH (ref 12.2–15.2)
WBC ADJUSTED: 4.3 10*9/L (ref 3.6–11.2)

## 2021-04-28 MED ADMIN — polyethylene glycol (MIRALAX) packet 17 g: 17 g | ORAL | @ 17:00:00 | Stop: 2021-05-04

## 2021-04-28 MED ADMIN — dexAMETHasone (DECADRON) tablet 4 mg: 4 mg | ORAL | @ 02:00:00

## 2021-04-28 MED ADMIN — gabapentin (NEURONTIN) capsule 300 mg: 300 mg | ORAL | @ 02:00:00

## 2021-04-28 MED ADMIN — HYDROmorphone (DILAUDID) tablet 2 mg: 2 mg | ORAL | @ 14:00:00 | Stop: 2021-05-09

## 2021-04-28 MED ADMIN — dexAMETHasone (DECADRON) tablet 4 mg: 4 mg | ORAL | @ 14:00:00

## 2021-04-28 MED ADMIN — atorvastatin (LIPITOR) tablet 40 mg: 40 mg | ORAL | @ 14:00:00

## 2021-04-28 MED ADMIN — docusate sodium (COLACE) capsule 100 mg: 100 mg | ORAL | @ 02:00:00

## 2021-04-28 MED ADMIN — apixaban (ELIQUIS) tablet 5 mg: 5 mg | ORAL | @ 14:00:00

## 2021-04-28 MED ADMIN — gabapentin (NEURONTIN) capsule 300 mg: 300 mg | ORAL | @ 14:00:00

## 2021-04-28 MED ADMIN — ferrous sulfate tablet 325 mg: 325 mg | ORAL | @ 14:00:00

## 2021-04-28 MED ADMIN — MORPhine injection 2 mg: 2 mg | INTRAVENOUS | @ 17:00:00 | Stop: 2021-05-09

## 2021-04-28 MED ADMIN — apixaban (ELIQUIS) tablet 5 mg: 5 mg | ORAL | @ 02:00:00

## 2021-04-28 MED ADMIN — docusate sodium (COLACE) capsule 100 mg: 100 mg | ORAL | @ 14:00:00

## 2021-04-28 MED ADMIN — amLODIPine (NORVASC) tablet 5 mg: 5 mg | ORAL | @ 14:00:00

## 2021-04-28 MED ADMIN — MORPhine (MS CONTIN) 12 hr tablet 15 mg: 15 mg | ORAL | @ 04:00:00 | Stop: 2021-05-09

## 2021-04-28 MED ADMIN — MORPhine (MS CONTIN) 12 hr tablet 15 mg: 15 mg | ORAL | @ 17:00:00 | Stop: 2021-05-09

## 2021-04-28 MED ADMIN — Abiraterone (ZYTIGA) tablet 250 mg **Patient Supplied**: 1000 mg | ORAL | @ 14:00:00

## 2021-04-28 NOTE — Unmapped (Signed)
PHYSICAL THERAPY  Evaluation (04/28/21 0946)          Patient Name:?? Kevin Cohen????????   Medical Record Number: [id number]   Date of Birth: 04/03/1951  Sex: Male??  ??    Treatment Diagnosis: mobiltiy deficits in setting of spinal canal stenosis from C3-5 without cord signal change, and left foraminal invasion at C5-6.     Activity Tolerance: Limited by pain     ASSESSMENT  Problem List: Decreased endurance, Decreased mobility, Fall Risk, Impaired ADLs, Pain      Assessment : 70 y.o. male with PMH of HTN, HLD, and metastatic prostate cancer with known osseous mets (s/p multiple rounds of chemotherapy and radiation), who presents with several month history of symptoms concerning for cervical myelopathy and several week history of worsening LUE weakness. MRI total spine from 9/14 shows diffuse metastatic disease with epidural thickening and spinal canal stenosis from C3-5 without cord signal change, and left foraminal invasion at C5-6. He is admitted for a planned OR 10/6 but holding off on surgery at this point given anatomic challenges. IVC filter placed. Limited session due to pt declining OOB mobility assessment despite encouragement to participate.  Patient presents in bed and was agreeable to bed level session only (due to pain, recent radiation, and waiting for lunch).  Patient demonstrated indep with HEP.   Anticipate progression with increased participation and continued acute and post acute care Physical Therapy follow up.   Please see Daily Intervention and Today's Intervention sections for additional session detail.  After a review of the personal factors, comorbidities, clinical presentation, and examination of the number of affected body systems, the patient presents as a moderate complexity case.      Today's Interventions: AMPAC: 18/24 (estimated).  Mobility assessment completed.  Therex/Theract: QS/APs instructed/performed (added to HEP.  Pt Education: POC, anticipated course of cont recovery, importance of only mobilizing with staff assist, importance/benefits of full participation with PT assessment/tx., potential benefits of HEP compliance.  All questions answered.  Patient verbalized understanding and stated intention to comply with recommendations.                            PLAN  Planned Frequency of Treatment:?? 1x per day for: 4-5x week       Planned Interventions: Balance activities, Education - Patient, Education - Family / caregiver, Endurance activities, Functional mobility, Home exercise program, Self-care / Home training, Therapeutic exercise, Therapeutic activity, Transfer training     Post-Discharge Physical Therapy Recommendations:?? 5x weekly, Low intensity (P)     PT DME Recommendations: Defer to post acute??????????       Goals:   Patient and Family Goals: to get my strength back and return home. -pt     Long Term Goal #1: pt will score 24/24 on AMPAC and return to prior ADLs in 4 weeks        SHORT GOAL #1: pt will perform all bed mobility including supine to/from sit indep  ?????????????????????? Time Frame : 2 weeks  SHORT GOAL #2: pt will paricipate in and complete formal OOB mobility assessment.  ?????????????????????? Time Frame : 2 weeks  SHORT GOAL #3: pt will indep perform and report compliance with HEP  ?????????????????????? Time Frame : 2 weeks     ??????????????????????       ??????????????????????       Prognosis:?? Good  Positive Indicators: strong family/daughter support        SUBJECTIVE  Patient  reports: agreeable to session  Current Functional Status: presents and left in bed     Prior Functional Status: pt reports indep rw and indep ambulation room distances at daughters home with rolling walker at recent baseline  Equipment available at home: Rolling Walker, Wheelchair-manual      No past medical history on file.         Social History     Tobacco Use   ??? Smoking status: Not on file   ??? Smokeless tobacco: Not on file   Substance Use Topics   ??? Alcohol use: Not on file       No past surgical history on file.          History reviewed. No pertinent family history.     Allergies: Penicillins                  Objective Findings  Precautions / Restrictions  Precautions: Falls precautions  Weight Bearing Status: Non-applicable  Required Braces or Orthoses: Non-applicable     Communication Preference: Verbal          Pain Comments: 7.5/10 left cervical/shld pain reported.  pt stating RN aware and he received pain meds ~10 min ago.     Equipment / Environment: Vascular access (PIV, TLC, Port-a-cath, PICC)           Orthostatics: asymptomatic        Living Situation  Living Environment: House  Lives With: Daughter  Home Living: One level home (denies STE)      Cognition: WFL  Cognition comment: pleasant and followed all commands however unwilling to attempt OOB mobility assessment despite encouragement  Visual/Perception:  (denies acute vision change/deficit)     Skin Inspection comment: visable skin grossly intact, left UE edema     Upper Extremities  UE ROM: Right WFL, Left Impaired/Limited  UE Strength: Right WFL, Left Impaired/Limited  UE comment: LUE: PROM shld flex to ~90deg, elbow flex ~ 80degrees (limited by pain).  LUE strength: shld flex, elbow flexion ~2/5, wrist flex/ext 3-/5, grip poor.    Lower Extremities  LE ROM: Right WFL, Left WFL  LE Strength: Right WFL, Left WFL  LE comment: subjective report of RLE stronger than LLE, however WFL bilat     Sensation comment: decreased distal LUE      Bed Mobility: min assist with all     Transfer comments: pt refusing OOB mobility assessment due to LUE pain, recently returned from radiation, and recently ordered lunch      Gait: pt refusing OOB mobility assessment due to LUE pain, recently returned from radiation, and recently ordered lunch                        Physical Therapy Session Duration  PT Individual [mins]: 25           I attest that I have reviewed the above information.  Signed: Henrene Pastor, PT  Filed 04/28/2021

## 2021-04-28 NOTE — Unmapped (Cosign Needed)
NEUROSURGERY PROGRESS NOTE      Brief History of Present Illness  Kevin Cohen is a 70 y.o. male with PMH of HTN, HLD, and metastatic prostate cancer with known osseous mets (s/p multiple rounds of chemotherapy and radiation), who presents with several month history of symptoms concerning for cervical myelopathy and several week history of worsening LUE weakness. MRI total spine from 9/14 shows diffuse metastatic disease with epidural thickening and spinal canal stenosis from C3-5 without cord signal change, and left foraminal invasion at C5-6. He is admitted for a planned OR 10/6 but holding off on surgery at this point given anatomic challenges.     Subjective/Interval History  Radiation started yesterday afternoon. Pt feeling well.     Interval Imaging Reviewed  none    Neurological Assessment and Plan  **Metastatic adenocarcinoma (CMS-HCC)  *Prostate Cancer with mets to cervical spine, recently established with [facility] and getting cancer treatment with hormone-directed therapy and radiation.  -Not a surgical candidate at this time  -Planning 2nd of 10 radiation treatments today  -consulted PM&R and PT/OT for possible AIR admission to continue radiation while at rehab    **Electrolyte status  Replete PRN    Non-neurologic problems being addressed    **DVT Left peroneal and politeal veins dx 04/26/21  -IVC filter placed 10/5 by [facility], needs outpt removal in 3-6 months  -on Eliquis 5mg  po bid    **Anemia  - chronic inflammatory and iron deficient  - iron supplements as tolerated.     **CKD  - baseline Cr 1.2, today 1.0    **HTN  - stable on home meds    Daily Maintenance:  VTE chemoprophylaxis: not needed while on Eliquis  IV Fluids: none  Bowel Regimen: Senna and Miralax  Foley status: none  Nutrition: Reg diet      Disposition: floor, full code, refer to AIR after gets PT/OT eval        For questions regarding patients, please page SRN Inpatient Pager: [phone].    ___________________________________________________________________      Neurological Exam  [name]  Ox3  RUE 5 delt/bi/tri/we, 4 hg/io  LUE 2 delt/bi/tri, 3we/4hg/io  BLE 5/5    Right fingertips numb      The patient's vitals, intake/output, labs, orders, and relevant imaging were reviewed for the last 24 hours.    Patient Active Problem List   Diagnosis   ??? Prostate cancer metastatic to bone (CMS-HCC)   ??? Displacement of lumbar intervertebral disc without myelopathy   ??? Metastatic adenocarcinoma (CMS-HCC)   ??? DVT (deep venous thrombosis) (CMS-HCC)   ??? Anemia   ??? Cervical myelopathy with cervical radiculopathy (CMS-HCC)   ??? HTN (hypertension)   ??? CKD (chronic kidney disease)

## 2021-04-28 NOTE — Unmapped (Signed)
Radiation Oncology Initial Visit Note     Encounter Date: 04/25/2021  Patient Name: Kevin Cohen  Patient Age: 70 y.o.  Patient DOB: [birth year]    Primary Care Provider: Abram Sander, MD    Referring Physician: Illene Bolus Dipakkumar Upad*    Reason for Consultation:   Kevin Cohen is a 70 y.o. male who is seen in consultation for metastatic prostate cancer.    Diagnosis:   1. Displacement of lumbar intervertebral disc without myelopathy    2. Metastatic adenocarcinoma (CMS-HCC)        Cancer Staging  Prostate cancer metastatic to bone (CMS-HCC)  Staging form: Prostate, AJCC 8th Edition  - Clinical: Stage IVB (cTX, cN1, pM1b) - Signed by Maurie Boettcher, MD on 04/07/2021      Assessment:  Kevin Cohen is a 70 y.o. male with metastatic prostate cancer with known osseous mets (s/p multiple rounds of chemotherapy and radiation to the prostate and Spine T5-T7 at [facility]). Now with cervical myelopathy and several week history of worsening LUE weakness. MRI total spine from 9/14 shows diffuse metastatic disease with epidural thickening and spinal canal stenosis from C3-5 without cord signal change, and left foraminal invasion at C5-6.     We spoke to the NSG team last night and again today who recommended no surgical intervention at this time due to foraminal invasion and extent outside of spine.  At this time surgical intervention could result in significant morbidity.  Therefore recommended that he continue forward with hormone directed therapy and palliative radiation to his cervical spine.    We reviewed with Mr. Senna is diagnosis, pathology, imaging, and treatment options. We discussed the benefits, side effects, and logistics of RT.  In his case we discussed that we are in agreement with the neurosurgical team that he should proceed with hormone directed therapy and palliative radiation.  We discussed that for palliative radiation we would likely treat with 3000 cGy delivered in 10 fractions.  This is above the previous irradiated area.  We obtain records from his prior treatment team showing field size, and feel comfortable that there will be no overlap with our current treatment plan.    After our discussion Mr. Bonneau asked great questions that were answered to the best of our ability. After our discussion he stated that he would like to proceed with palliative radiation therapy.  Written informed consent was obtained during this visit.    Plan:  -Perform CT simulation today  -Plan to treat with 30Gy/16fx with first treatment plan for this afternoon  -Continue with systemic or hormone directed therapy per medical oncology      -------------------------------------------------    History of Present Illness:  The patient is a 70 y.o. male with metastatic prostate cancer resulting in left upper extremity weakness from cord compression.  His oncologic history is summarized below.  In addition to his oncologic history he has been on Percocet today Mr. Ratterman states that his left arm weakness has been becoming steadily worse in the last 2 to 3 months.  He sought medical attention more due to increased pain in the left neck and shoulder.  He also endorses altered sensation of the left hand and right finger numbness.  For some time with limited pain relief and has noted it keeps getting worse.  He has also noticed worsening mobility.    Oncology History Overview Note   - 06/19: Found to have PSA 72 with PCP. Referred to urology.  -  01/01/18: Received degarelix loading dose with urology  - 02/28/18: Prostate bipsy with pathology showed prostate adenocarcinoma with androgen deprivation treatment effect. Adenocarcinoma is present in all core fragments with involvement of 50% or more of each core. Perineural invasion is present. Gleason grade not reliably assessed due to ADT effect.  - 03/11/18: Staging PET: Enlarged left external iliac node measuring 1.4 cm, [name], [name], faint sclerosis in the   right anterior sixth rib, neither with significant metabolic activity.  - 03/2018: Underwent definitive XRT, finished 07/2018.  - 04/18/18: PSA 3.8  - 08/22/18: PSA 0.83  - 08/20: Declined additional ADT due to vasomotor symptoms  - 04/14/19: PSA 24  - 04/28/19: CT CAP shows enlarging sclerotic focus along the posteroinferior T6 vertebral body, [name], [name], T6 and T1 vertebral body correspond to sclerotic lesions on comparison CT and consistent with skeletal metastasis.   - 05/13/19: PSA 33  - 05/15/19: Started docetaxel  - 06/04/19: PSA 53  - 06/26/19: PSA 14  - 08/17/19: PSA 7  - 09/07/19: PSA 5.5  - 10/05/19: PSA 6.0. Completed 6 cycles of docetaxel.  - 11/03/19: PSA 4.3  - 01/05/20: PSA 5.5  - 01/06/20: Found on renal US to have posterior bladder wall mass, declined urology referral or additional workup.  - 04/06/20: PSA 11.9  - 09/21: Stefani Dama but stopped after 2-3 weeks 2/2 side effects.  - 05/06/20: PSA 14  - 10/21: Declined further treatment and elected hospice.  - 5/22-31/22: Admitted for T6-T7 cord compression. Started steroids and treated with XRT, finished 12/26/20. Discharged to SNF.  - 12/13/20: PSA 220  - 01/24/21: See by Dr. Cathie Hoops with [facility]. Noted lower body numbness and BLE weakness. Recommended restarting ADT, possible oral chemotherapy. Declined treatment.  - 03/31/21: Seen by Dr. Sandie Ano with [facility]. Ordered MRI spine and CT CAP, [name], [name], [name], indeterminate 0.4 cm left lower lobe groundglass nodule.  - 04/05/21: MRI C/T/L Spine with possible discitis/osteomyelitis C5-T1 with intraosseous abscess and surrounding epidural and paravertebral soft tissue thickening, diffuse epidural and osseous mets, and additional spondylosis of several segments with associated moderate spinal canal narrowing (C3-C5, C7-T1, T3-T4) and severe narrowing C5-C7.     Prostate cancer metastatic to bone (CMS-HCC)   04/05/2021 Initial Diagnosis    Malignant neoplasm of prostate metastatic to bone (CMS-HCC)     04/07/2021 -  Cancer Staged    Staging form: Prostate, AJCC 8th Edition  - Clinical: Stage IVB (cTX, cN1, pM1b) - Signed by Maurie Boettcher, MD on 04/07/2021       04/10/2021 - 04/10/2021 Endocrine/Hormone Therapy    OP DEGARELIX  Plan Provider: Maurie Boettcher, MD     04/27/2021 -  Radiation    Radiation Therapy Treatment Details (Noted on 04/27/2021)  Site: Spine - Cervical  Technique: 3D CRT  Goal: No goal specified  Planned Treatment Start Date: No planned start date specified     05/11/2021 Endocrine/Hormone Therapy    OP LEUPROLIDE (LUPRON) 22.5 MG  EVERY 3 MONTHS  Plan Provider: Maurie Boettcher, MD         Prior Radiation Therapy:  Yes.    Pacemaker:  No.    Pregnancy status:  No; male patient  Connective Tissue/Inflammatory Disorder: None    Past Medical History  No past medical history on file.     Past Surgical History  No past surgical history on file.     Medications and Allergies  Reviewed in EPIC    Family History:  Cancer-related family history is not on file.     Social History:  Social History     Social History Narrative    Not on file      The patient lives in [place]  Support: He has good support from his family  Smoking: Former smoker.  Quit 2021 previously smoked approximately 2 packs/day  Etoh: Drinks approximately 9 drinks per week    Review of Systems:  A comprehensive review of 10 systems was negative except for pertinent positives noted in HPI.    Physical Exam: BP 124/74  - Pulse 62  - Temp 36.2 ??C (97.2 ??F) (Temporal)  - Resp 18  - SpO2 97%   Karnofsky/Lansky Performance Status:  70, Cares for self; unable to carry on normal activity or to do active work (ECOG equivalent 1)  General/Constitutional: Well-appearing, NAD   HEENT: Normocephalic, atraumatic, no scleral icterus   Skin: No suspicious lesions or rashes  Pulmonary: No respiratory distress or increased work of breathing   Abdominal: Non distended  Musculoskeletal: Full range of motion in the extremities, without edema   Neurologic: Alert and oriented to conversation.  Cranial nerves III through XII intact.  Left shoulder flexion, abduction /left elbow flexion/left grip strength all 2 out of 5.  Decreased sensation in distal left hand.   Right hand demonstrates 4 out of 5 hand grip strength.  Otherwise strength and sensation normal throughout.  Psychiatric: Appropriate affect and judgement        RADIOLOGY: Imaging was personally reviewed (see above)    - 04/05/2021   MRI Cervical, Thoracic, Lumbar Spine  FINDINGS:   Exam is degraded by motion artifact. Posterior spinal fusion L4-S1 vertebral bodies and L4-L5 intervertebral disc spacer. Adjacent susceptibility artifact limits evaluation.     The cord is unremarkable and the conus medullaris ends at a normal level.     Reversal of cervical lordosis centered at C6. Osseous fusion of C5-C7 vertebral bodies with abnormal T2 hyperintense signal with intraosseous fluid collection vs tumor necrosis with peripheral enhancement crossing the disc spaces extending from C5 to T1. There is surrounding paravertebral soft tissue thickening and enhancement. The possible fluid collection vs necrotic cavity measures 2.7 x 2.8 x 4.2 cm (36:26, 38:6). Epidural soft tissue thickening and enhancement extends from the C1 vertebral body through the lumbar spine. Poorly visualized probable metastasis is also noted involving the posterior elements at C7 on this previous single image. Diffuse T2 hyperintense, T1 hypointense, enhancing lesions throughout the vertebral bodies extending into the posterior elements and involving the iliac bones.     Multilevel disc desiccation with intervertebral disc height loss throughout the cervical, thoracic and lumbar spine.       CERVICAL: Epidural soft tissue thickening and enhancement throughout the cervical spine contributing to the findings below     C2-C3: No spinal canal narrowing. Right uncovertebral and facet arthropathy resulting in severe right neuroforaminal narrowing.     C3-C4: Central disc protrusion, epidural soft  tissue, uncovertebral facet hypertrophy. Moderate spinal canal narrowing. Severe bilateral neuroforaminal narrowing.     C4-C5: Disc bulge and annular fissure, epidural soft tissue. Moderate spinal canal narrowing. Mild bilateral neuroforaminal narrowing.     C5-C6: Disc osteophyte complex, epidural soft tissue, uncovertebral facet hypertrophy. Moderate to severe spinal canal narrowing. Severe right and moderate left neuroforaminal narrowing.     C6-C7: Disc osteophyte complex, epidural soft tissue, uncovertebral and facet arthropathy. Severe spinal canal narrowing. Severe left greater than right neuroforaminal narrowing.      C7-T1: Disc osteophyte complex, epidural soft tissue, uncovertebral and facet hypertrophy. Moderate spinal canal narrowing. Severe bilateral neuroforaminal narrowing.      THORACIC: Epidural soft tissue thickening/enhancement, multilevel ligamentum flavum thickening. Moderate T3-T4, mild T8-9, T11-T12. Multilevel mild to severe neural foraminal narrowing, worst at T5-6 and T6-7.      LUMBAR:     L1-L2: No herniation. Ligamentum flavum thickening. No spinal canal narrowing. Moderate bilateral facet arthropathy. Moderate right and mild left neuroforaminal narrowing.     L2-L3: No herniation. Epidural soft tissue, moderate ligamentum flavum thickening and facet arthrosis. No mild spinal canal narrowing.. No neural foraminal narrowing.     L3-S1: Not well evaluated due to susceptibility artifact from fusion hardware. tumor     IMPRESSION:  -Possible discitis osteomyelitis involving C5-T1 with 4.2 cm intraosseous abscess and surrounding epidural and paravertebral soft tissue thickening. Alternatively, extensive tumor / necrosis as above.  Please correlate with clinical findings.     -Epidural soft tissue thickening and enhancement extends from the skull base to the visualized lumbar spine most consistent with diffuse epidural metastases. No epidural abscess.     -Diffuse osseous metastatic disease throughout the vertebral bodies and posterior elements and involving the sacrum. This appears to have changed significantly compared to the prior thoracic and lumbar spine MRI dated 12/10/2020     -Spondylosis of the cervical spine with moderate spinal canal narrowing from C3-C5, C7-T1 and severe spinal canal narrowing at C5-C7 exacerbated by the underlying process.     -Spondylosis of the thoracic spine with moderate T3-T4 and mild T8-T9, T11-T12 spinal canal narrowing. Foraminal narrowing diffusely, worst at T5-6 and T6-7.     -Limited evaluation of the mid and lower lumbar spine due to susceptibility artifact from posterior fusion hardware extending from L4 to S1. Mild spinal canal narrowing at L2-L3.        Labs:  No results found for: CA125, CEA, AFPTM, CA199, HCGTM, HE4, PSADIAG  Lab Results   Component Value Date    WBC 3.6 04/27/2021    WBC 3.6 04/27/2021    WBC 3.7 04/26/2021    WBC 3.0 (L) 04/25/2021    HGB 8.2 (L) 04/27/2021    HGB 8.2 (L) 04/27/2021    HGB 8.2 (L) 04/26/2021    HGB 7.8 (L) 04/25/2021    HCT 24.2 (L) 04/27/2021    HCT 24.6 (L) 04/27/2021    HCT 24.2 (L) 04/26/2021    HCT 23.3 (L) 04/25/2021    Platelet 137 (L) 04/27/2021    Platelet 143 (L) 04/27/2021    Platelet 133 (L) 04/26/2021    Platelet 124 (L) 04/25/2021    Creatinine 1.06 04/27/2021    Creatinine 1.15 (H) 04/26/2021    Creatinine 1.33 (H) 04/25/2021 AST 16 04/26/2021    ALT <7 (L) 04/26/2021    Magnesium 2.0 04/25/2021         Electronically Signed:  Sunday Shams, MD  Radiation Oncology Pgy-[facility]  [facility]  04/27/2021  10:14 PM

## 2021-04-28 NOTE — Unmapped (Addendum)
**  Metastatic adenocarcinoma (CMS-HCC)  *Prostate Cancer with mets to cervical spine, recently established with [facility] and getting palliative treatment with hormone-directed therapy and radiation.  -Not a surgical candidate at this time  -Planning 10 radiation treatments this admission   -consulted PM&R and PT/OT for possible AIR admission to continue radiation while at rehab     **DVT Left peroneal and politeal veins dx 04/26/21  -IVC filter placed 10/5 by [facility], needs outpt removal in 3-6 months  -on Eliquis 5mg  po bid     **Anemia  - chronic inflammatory and iron deficient  - iron supplements as tolerated.      **CKD  - baseline Cr 1.2     **HTN  - stable on home meds

## 2021-04-28 NOTE — Unmapped (Signed)
OCCUPATIONAL THERAPY  Evaluation (04/28/21 1247)    Patient Name:  Kevin Cohen       Medical Record Number: [id number]   Date of Birth: [birth year]  Sex: Male          OT Treatment Diagnosis:  OT consulted for deconditioning to address ADLs, functional mobility, and activity tolerance    Assessment  Clinical Decision Making: Moderate  Assessment: pt is a 70 y.o. male who presents cervical myelopathy and worsening LUE weakness. Patient has prostate cancer, diagnosed in 2019, with nodal involement and bony metastases to the cervical and thoracic spine, including known T5-6 met since May 2022. pt presents to OT on this date with impaired BUE strength, that is more extensive in LUE, impacting his ability to complete functional tasks. pt also presents with impaired functional mobility and pain, impacting his ability to complete his ADL routine I/mod I. pt would continue to benefit from skilled acute OT in order to improve safety and independence in ADLs/iADLs, functional mobiltiy, and overall activity tolerance. pt post acute recs at this time are for 5x weekly with high intensity.  Today's Interventions: pt completed bed mobility, LBS, functional mobility, and BUE ROM testing. pt educated on OT role and POC as well as importance of incorporating use of LUE into functional tasks    Activity Tolerance During Today's Session  Tolerated treatment well    Plan  Planned Frequency of Treatment:  1-2x per day for: 3-4x week  Planned Treatment Duration: 05/19/21    Planned Interventions:  Adaptive equipment, ADL retraining, Balance activities, Bed mobility, Compensatory tech. training, Conservation, Education - Patient, Education - Family / caregiver, Endurance activities, Environmental support, Functional mobility, Home exercise program, Chief of Staff, Safety education, Therapeutic exercise, Teacher, early years/pre, UE Strength / coordination exercise    Post-Discharge Occupational Therapy Recommendations:   5x weekly, High intensity   OT DME Recommendations: Defer to post acute -        GOALS:   Patient and Family Goals: To go home    IP Long Term Goal #1: pt will score 24/24 on the AMPAC within 8 weeks       Short Term:  pt will complete toilet t/f and routine with mod I   Time Frame : 3 weeks  pt will complete UBD with mod I   Time Frame : 3 weeks  pt will complete LBD with mod I   Time Frame : 3 weeks  pt will complete bimanual grooming task with min A   Time Frame : 3 weeks  pt will complete B hand HEP with I   Time Frame : 3 weeks    Prognosis:  Good  Positive Indicators:  PLOF, motivation, family support  Barriers to Discharge: Endurance deficits, Functional strength deficits, Inability to safely perform ADLS    Subjective  Current Status pt recieved/left supine with HOB raised with call bell within reach and all immediate needs met. RN aware  Prior Functional Status pt reports that he is I for his ADLs, but completes bathing while seated on toilet with wash cloths. pt states that he utilizes RW for mobility and spends most of the day on the porch playing with his puppy and watching westerns. pt states that he does not drive and is retired. pt states that he occasionally goes out shopping with daughter, but that he spends most of the day at home. pt endorses a fall ~ 4 months ago stating that he was walking on an uneven surface with  RW and did not notice the change in elevation, he states that he did not require any medical attention    Medical Tests / Procedures: Reviewed       Patient / Caregiver reports: I've been living with my daughter until my mobility gets better    No past medical history on file. Social History     Tobacco Use   ??? Smoking status: Not on file   ??? Smokeless tobacco: Not on file   Substance Use Topics   ??? Alcohol use: Not on file      No past surgical history on file. History reviewed. No pertinent family history.     Penicillins     Objective Findings  Precautions / Restrictions  Falls precautions    Weight Bearing  Non-applicable    Required Braces or Orthoses  Non-applicable    Communication Preference  Verbal    Pain  pt endorses pain of 8.5/10 immediately following RN providing oral and IV pain medication. Session adjusted per pt tolerance    Equipment / Environment  Vascular access (PIV, TLC, Port-a-cath, PICC), Patient not wearing mask for full session    Living Situation  Living Environment: House  Lives With: Daughter  Home Living: One level home, Stairs to enter with rails, Standard height toilet, Tub/shower unit (pt showers on toilet with wash cloth)  Rail placement (outside): Bilateral rails  Number of Stairs to Enter (outside): 2  Equipment available at home: [employer], [employer]     Cognition   Orientation Level:  Oriented x 4   Arousal/Alertness:  Appropriate responses to stimuli   Attention Span:  Appears intact   Memory:  Appears intact   Following Commands:  Follows all commands and directions without difficulty   Safety Judgment:  Good awareness of safety precautions   Awareness of Errors:  Good awareness of safety precautions   Problem Solving:  Able to problem solve independently   Comments:      Vision / Hearing   Vision: Wears glasses for distance only, Glasses not present     Hearing: No deficit identified         Hand Function:  Right Hand Function: Right hand function impaired  Right Hand Impairment: grip strength poor, ROM WNL  Left Hand Function: Left hand function impaired  Left Hand Impairment: grip strength poor, ROM WFL  Hand Dominance: Right    Skin Inspection:  Skin Inspection: Intact where visualized    ROM / Strength:  UE ROM/Strength: Right WFL, Left Impaired/Limited  LUE Impairment: Reduced strength, Limited AROM, Limited PROM, Pain with movement  UE ROM/ Strength Comment: pt has no AROM of LUE. PROM L shoulder flexion: ~90 degrees, PROM L elbow flexion: ~45 degrees, PROM L shoulder adduction ~45 degrees. PROM terminated d/t pain with mobility, but there was no hard end feel.  LE ROM/Strength: Left WFL, Right WFL    Coordination:  Coordination: WFL    Sensation:  Sensory/ Proprioception/ Stereognosis comments: pt endorses N/T in L thumb and 1st/2nd digit that has been happening for the past few months    Balance:  pt required S for static seated balance, SBA for dynamic seated balance, CGA for static standing balance, and min A for dynamic standing balance    Functional Mobility  Transfer Assistance Needed: Yes  Transfers - Needs Assistance: Contact Guard assist  Bed Mobility Assistance Needed: Yes  Bed Mobility - Needs Assistance: Standby assist  Ambulation: pt tolerates taking ~4 side steps at EOB with  RW, but required min A for RW management (use of larger RW which is heavier and more difficult to manage in comparison to standard walker, do not anticpaite any difficulty with management of standard walker). pt required mod VCs and use of tactile cues for upright posture, but grimaces with full hip extension      ADLs  Feeding - Needs Assistance: Set Up Assist  Grooming - Needs Assistance: Min assist  Bathing - Needs Assistance: Min assist  Toileting - Needs Assistance: Min assist  UB Dressing - Needs Assistance: Min assist  LB Dressing - Needs Assistance: Min assist  IADLs: NT      Vitals / Orthostatics  At Rest: NAD  With Activity: NAD  Orthostatics: Asymptomatic      Medical Staff Made Aware: RN Nichole      Occupational Therapy Session Duration  OT Individual [mins]: 24         I attest that I have reviewed the above information.  Signed: Dorice Lamas, OT  Filed 04/28/2021

## 2021-04-29 LAB — CBC
HEMATOCRIT: 23.4 % — ABNORMAL LOW (ref 39.0–48.0)
HEMOGLOBIN: 7.9 g/dL — ABNORMAL LOW (ref 12.9–16.5)
MEAN CORPUSCULAR HEMOGLOBIN CONC: 33.5 g/dL (ref 32.0–36.0)
MEAN CORPUSCULAR HEMOGLOBIN: 30.6 pg (ref 25.9–32.4)
MEAN CORPUSCULAR VOLUME: 91.3 fL (ref 77.6–95.7)
MEAN PLATELET VOLUME: 7.8 fL (ref 6.8–10.7)
PLATELET COUNT: 139 10*9/L — ABNORMAL LOW (ref 150–450)
RED BLOOD CELL COUNT: 2.57 10*12/L — ABNORMAL LOW (ref 4.26–5.60)
RED CELL DISTRIBUTION WIDTH: 16.3 % — ABNORMAL HIGH (ref 12.2–15.2)
WBC ADJUSTED: 4.7 10*9/L (ref 3.6–11.2)

## 2021-04-29 MED ADMIN — MORPhine injection 2 mg: 2 mg | INTRAVENOUS | @ 05:00:00 | Stop: 2021-05-09

## 2021-04-29 MED ADMIN — apixaban (ELIQUIS) tablet 5 mg: 5 mg | ORAL

## 2021-04-29 MED ADMIN — HYDROmorphone (DILAUDID) tablet 2 mg: 2 mg | ORAL | Stop: 2021-05-09

## 2021-04-29 MED ADMIN — gabapentin (NEURONTIN) capsule 300 mg: 300 mg | ORAL | @ 12:00:00

## 2021-04-29 MED ADMIN — amLODIPine (NORVASC) tablet 5 mg: 5 mg | ORAL | @ 12:00:00

## 2021-04-29 MED ADMIN — polyethylene glycol (MIRALAX) packet 17 g: 17 g | ORAL | @ 15:00:00 | Stop: 2021-05-04

## 2021-04-29 MED ADMIN — MORPhine (MS CONTIN) 12 hr tablet 15 mg: 15 mg | ORAL | @ 13:00:00 | Stop: 2021-05-09

## 2021-04-29 MED ADMIN — HYDROmorphone (DILAUDID) tablet 2 mg: 2 mg | ORAL | @ 12:00:00 | Stop: 2021-05-09

## 2021-04-29 MED ADMIN — senna (SENOKOT) tablet 2 tablet: 2 | ORAL | @ 15:00:00 | Stop: 2021-05-03

## 2021-04-29 MED ADMIN — dexAMETHasone (DECADRON) tablet 4 mg: 4 mg | ORAL

## 2021-04-29 MED ADMIN — docusate sodium (COLACE) capsule 100 mg: 100 mg | ORAL | @ 12:00:00

## 2021-04-29 MED ADMIN — MORPhine (MS CONTIN) 12 hr tablet 15 mg: 15 mg | ORAL | @ 02:00:00 | Stop: 2021-05-09

## 2021-04-29 MED ADMIN — atorvastatin (LIPITOR) tablet 40 mg: 40 mg | ORAL | @ 12:00:00

## 2021-04-29 MED ADMIN — Abiraterone (ZYTIGA) tablet 250 mg **Patient Supplied**: 1000 mg | ORAL | @ 13:00:00

## 2021-04-29 MED ADMIN — apixaban (ELIQUIS) tablet 5 mg: 5 mg | ORAL | @ 12:00:00

## 2021-04-29 MED ADMIN — MORPhine injection 2 mg: 2 mg | INTRAVENOUS | @ 17:00:00 | Stop: 2021-05-09

## 2021-04-29 MED ADMIN — ferrous sulfate tablet 325 mg: 325 mg | ORAL | @ 12:00:00

## 2021-04-29 MED ADMIN — docusate sodium (COLACE) capsule 100 mg: 100 mg | ORAL

## 2021-04-29 MED ADMIN — dexAMETHasone (DECADRON) tablet 4 mg: 4 mg | ORAL | @ 12:00:00

## 2021-04-29 MED ADMIN — HYDROmorphone (DILAUDID) tablet 2 mg: 2 mg | ORAL | @ 21:00:00 | Stop: 2021-05-09

## 2021-04-29 MED ADMIN — gabapentin (NEURONTIN) capsule 300 mg: 300 mg | ORAL

## 2021-04-29 NOTE — Unmapped (Signed)
Care Management  Initial Transition Planning Assessment              General  Care Manager assessed the patient by : In person interview with patient  Orientation Level: Oriented X4  Functional level prior to admission: Independent  Reason for referral: Discharge Planning      CM met with patient in pt room.  Pt/visitors were not wearing hospital provided masks for the duration of the interaction.   CM was wearing hospital provided surgical mask.  CM was not within 6 foot of the patient/visitors during this interaction.       Type of Residence: Mailing Address:  [address]  [place] [address]  Contacts:    Patient Phone Number: [phone]        Medical Provider(s): Abram Sander, MD  Reason for Admission: Admitting Diagnosis:  Displacement of lumbar intervertebral disc without myelopathy [M51.26]  Metastatic adenocarcinoma (CMS-HCC) [C79.9]  Past Medical History:   has no past medical history on file.  Past Surgical History:   has no past surgical history on file.   Previous admit date: N/A    Primary Insurance- Payor: HU[employer]NA MEDICARE ADV / Plan: HUMANA GOLD PLUS HMO / Product Type: *No Product type* /   Secondary Insurance - Secondary Insurance  MEDICAID [place]  Prescription Coverage - Humana  Preferred Pharmacy - [facility] - [place] ([place]), [place] - [address]  [facility]    Transportation home: Private vehicle if home, BLS Ambulance if rehab facility              Contact/Decision Maker  Extended Emergency Contact Information  Primary Emergency Contact: Norwood,Catherine Gehlhausen  Mobile Phone: [phone]  Relation: Daughter  Secondary Emergency Contact: Noodwood,Catherine  Mobile Phone: [phone]  Relation: Daughter  Preferred language: ENGLISH  Interpreter needed? No    Legal Next of Kin / Guardian / POA / Advance Directives     HCDM (patient stated preference): Noodwood,Catherine - Daughter - [phone]    Advance Directive (Medical Treatment)  Does patient have an advance directive covering medical treatment?: Patient does not have advance directive covering medical treatment.  Reason patient does not have an advance directive covering medical treatment:: Patient does not wish to complete one at this time.    Health Care Decision Maker [HCDM] (Medical & Mental Health Treatment)  Healthcare Decision Maker: HCDM documented in the HCDM/Contact Info section.    Advance Directive (Mental Health Treatment)  Does patient have an advance directive covering mental health treatment?: Patient does not have advance directive covering mental health treatment.  Reason patient does not have an advance directive covering mental health treatment:: Patient does not wish to complete one at this time.    Readmission Information                                     Did the following happen with your discharge?                                                     Patient Information  Lives with: Children (Lives with daughter Lisbeth Renshaw - [phone])    Type of Residence: Private residence  Location/Detail: 3 steps to enter 1-level home    Support Systems/Concerns: Family Members    Responsibilities/Dependents at home?: No    Home Care services in place prior to admission?: No                  Equipment Currently Used at Home: other (see comments), walker, rolling, wheelchair, manual (3:1 bedside commode)       Currently receiving outpatient dialysis?: No       Financial Information       Need for financial assistance?: No       Social Determinants of Health  Social Determinants of Health were addressed in provider documentation.  Please refer to patient history.  Social Determinants of Health     Tobacco Use: Not on file   Alcohol Use: Not on file   Financial Resource Strain: Low Risk    ??? Difficulty of Paying Living Expenses: Not hard at all   Food Insecurity: No Food Insecurity   ??? Worried About Programme researcher, broadcasting/film/video in the Last Year: Never true   ??? Ran Out of Food in the Last Year: Never true   Transportation Needs: No Transportation Needs   ??? Lack of Transportation (Medical): No   ??? Lack of Transportation (Non-Medical): No   Physical Activity: Not on file   Stress: Not on file   Social Connections: Not on file   Intimate Partner Violence: Not on file   Depression: Not on file   Housing/Utilities: Unknown   ??? Within the past 12 months, have you ever stayed: outside, in a car, in a tent, in an overnight shelter, or temporarily in someone else's home (i.e. couch-surfing)?: No   ??? Are you worried about losing your housing?: No   ??? Within the past 12 months, have you been unable to get utilities (heat, electricity) when it was really needed?: Not on file   Substance Use: Not on file   Health Literacy: Not on file       Complex Discharge Information    Is patient identified as a difficult/complex discharge?: No                                                               Interventions:       Discharge Needs Assessment  Concerns to be Addressed:      Clinical Risk Factors: > 65, Multiple Diagnoses (Chronic)    Barriers to taking medications: No    Prior overnight hospital stay or ED visit in last 90 days: No                        Discharge Facility/Level of Care Needs: other (see comments) (Possible acure inpatient rehab v/s snf)    Readmission  Risk of Unplanned Readmission Score: UNPLANNED READMISSION SCORE: 15.37%  Predictive Model Details          15% (Medium)  Factor Value    Calculated 04/28/2021 16:04 25% Number of active Rx orders 31    [place] Risk of Unplanned Readmission Model 12% Diagnosis of cancer present     11% Charlson Comorbidity Index 8     8% Imaging order present in last 6 months     7% Latest hemoglobin low (8.0 g/dL)  7% Phosphorous result present     6% Age 70     5% Diagnosis of deficiency anemia present     5% Active anticoagulant Rx order present     5% Active corticosteroid Rx order present     4% Diagnosis of renal failure present 3% Current length of stay 2.812 days     2% Future appointment scheduled      Readmitted Within the Last 30 Days? (No if blank)   Patient at risk for readmission?: Yes    Discharge Plan  Screen findings are: Discharge planning needs identified or anticipated (Comment). (Possible acute inpatient rehab v/s SNF)    Expected Discharge Date:     Expected Transfer from Critical Care:      Quality data for continuing care services shared with patient and/or representative?: N/A  Patient and/or family were provided with choice of facilities / services that are available and appropriate to meet post hospital care needs?: Yes   List choices in order highest to lowest preferred, if applicable. : [facility]    Initial Assessment complete?: Yes

## 2021-04-29 NOTE — Unmapped (Signed)
V/S stable, no neuro changes noted, no falls, pain managed with PRN medication. Call lights within reach, will continue to monitor.        Problem: Adult Inpatient Plan of Care  Goal: Plan of Care Review  Outcome: Progressing  Goal: Patient-Specific Goal (Individualized)  Outcome: Progressing  Flowsheets (Taken 04/29/2021 0436)  Patient-Specific Goals (Include Timeframe): Pt will have zero falls during this hospitalization  Goal: Absence of Hospital-Acquired Illness or Injury  Outcome: Progressing  Intervention: Identify and Manage Fall Risk  Recent Flowsheet Documentation  Taken 04/29/2021 0200 by Neldon Mc, RN  Safety Interventions:  ??? bed alarm  ??? commode/urinal/bedpan at bedside  ??? lighting adjusted for tasks/safety  ??? low bed  ??? nonskid shoes/slippers when out of bed  ??? room near unit station  Intervention: Prevent and Manage VTE (Venous Thromboembolism) Risk  Recent Flowsheet Documentation  Taken 04/29/2021 0200 by Neldon Mc, RN  Activity Management: activity adjusted per tolerance  Goal: Optimal Comfort and Wellbeing  Outcome: Progressing  Goal: Readiness for Transition of Care  Outcome: Progressing  Goal: Rounds/Family Conference  Outcome: Progressing     Problem: Fall Injury Risk  Goal: Absence of Fall and Fall-Related Injury  Outcome: Progressing  Intervention: Promote Injury-Free Environment  Recent Flowsheet Documentation  Taken 04/29/2021 0200 by Neldon Mc, RN  Safety Interventions:  ??? bed alarm  ??? commode/urinal/bedpan at bedside  ??? lighting adjusted for tasks/safety  ??? low bed  ??? nonskid shoes/slippers when out of bed  ??? room near unit station     Problem: VTE (Venous Thromboembolism)  Goal: VTE (Venous Thromboembolism) Symptom Resolution  Outcome: Progressing  Intervention: Prevent or Manage VTE (Venous Thromboembolism)  Recent Flowsheet Documentation  Taken 04/29/2021 0200 by Neldon Mc, RN  Anti-Embolism Intervention: (No orders) Other (Comment) Problem: Self-Care Deficit  Goal: Improved Ability to Complete Activities of Daily Living  Outcome: Progressing

## 2021-04-29 NOTE — Unmapped (Signed)
NEUROSURGERY PROGRESS NOTE      Brief History of Present Illness  Kevin Cohen is a 70 y.o. male with PMH of HTN, HLD, and metastatic prostate cancer with known osseous mets (s/p multiple rounds of chemotherapy and radiation), who presents with several month history of symptoms concerning for cervical myelopathy and several week history of worsening LUE weakness. MRI total spine from 9/14 shows diffuse metastatic disease with epidural thickening and spinal canal stenosis from C3-5 without cord signal change, and left foraminal invasion at C5-6. He is admitted for a planned OR 10/6 but holding off on surgery at this point given anatomic challenges.     Subjective/Interval History  Pt undergoing radiation. Feeling well this morning. Working towards [facility].     Interval Imaging Reviewed  none    Neurological Assessment and Plan  **Metastatic adenocarcinoma (CMS-HCC)  *Prostate Cancer with mets to cervical spine, recently established with [facility] and getting cancer treatment with hormone-directed therapy and radiation.  -Not a surgical candidate at this time  -Undergoing radiation with rad onc, started 10/6  -consulted PM&R and PT/OT for possible [facility] admission to continue radiation while at rehab  - dex 4 mg BID  - scheduled morphine for pain    **Electrolyte status  Replete PRN    Non-neurologic problems being addressed    **DVT Left peroneal and politeal veins dx 04/26/21  -IVC filter placed 10/5 by [facility], needs outpt removal in 3-6 months  -on Eliquis 5mg  po bid    **Anemia  - chronic inflammatory and iron deficient  - iron supplements as tolerated.     **CKD  - baseline Cr 1.2    **HTN  - stable on home meds    Daily Maintenance:  VTE chemoprophylaxis: not needed while on Eliquis  IV Fluids: none  Bowel Regimen: Senna and Miralax  Foley status: none  Nutrition: Reg diet      Disposition: floor, full code, refer to [facility] after gets PT/OT eval        For questions regarding patients, please page [facility] Inpatient Pager: [phone].    ___________________________________________________________________      Neurological Exam  [name]  Ox3  PERRL  RUE 5 delt/bi/tri/we, 4 hg/io  LUE 2 delt 4- bi/tri 4 we/wf/hg/io  BLE 5/5    Right fingertips numb      The patient's vitals, intake/output, labs, orders, and relevant imaging were reviewed for the last 24 hours.    Patient Active Problem List   Diagnosis   ??? Prostate cancer metastatic to bone (CMS-HCC)   ??? Displacement of lumbar intervertebral disc without myelopathy   ??? Metastatic adenocarcinoma (CMS-HCC)   ??? DVT (deep venous thrombosis) (CMS-HCC)   ??? Anemia   ??? Cervical myelopathy with cervical radiculopathy (CMS-HCC)   ??? HTN (hypertension)   ??? CKD (chronic kidney disease)

## 2021-04-29 NOTE — Unmapped (Signed)
Pt encouraged to call for assistance to prevent falling. Pt was encouraged to assists with ADL's as tolerated. Pt  was educated on ways to prevent hospital-acquired illness and injuries. Pt was educated on specific goals during hospital stay.      Problem: Adult Inpatient Plan of Care  Goal: Plan of Care Review  Outcome: Progressing  Goal: Patient-Specific Goal (Individualized)  Outcome: Progressing  Goal: Absence of Hospital-Acquired Illness or Injury  Outcome: Progressing  Goal: Optimal Comfort and Wellbeing  Outcome: Progressing  Goal: Readiness for Transition of Care  Outcome: Progressing  Goal: Rounds/Family Conference  Outcome: Progressing

## 2021-04-29 NOTE — Unmapped (Signed)
Acute Inpatient Rehab referral has been received and patient is currently under review.       Please be advised, [facility] is now located at the [facility].      Visitation Policy:   [facility]IR allows visitors (6 AM- 9 PM) and one visitor may be designated to stay overnight. The overnight visitor must be in the facility prior to 9 PM. No visitors under the age of 97 will be allowed to visit for the foreseeable future.      Please call admissions office with any pertinent updates. Thank you.      Myriam Forehand, [facility]  [facility]  Inpatient Coordinator  [phone]    9:44 PM 04/28/2021

## 2021-04-30 LAB — CBC
HEMATOCRIT: 24.2 % — ABNORMAL LOW (ref 39.0–48.0)
HEMOGLOBIN: 8.2 g/dL — ABNORMAL LOW (ref 12.9–16.5)
MEAN CORPUSCULAR HEMOGLOBIN CONC: 34 g/dL (ref 32.0–36.0)
MEAN CORPUSCULAR HEMOGLOBIN: 31.1 pg (ref 25.9–32.4)
MEAN CORPUSCULAR VOLUME: 91.5 fL (ref 77.6–95.7)
MEAN PLATELET VOLUME: 8 fL (ref 6.8–10.7)
PLATELET COUNT: 146 10*9/L — ABNORMAL LOW (ref 150–450)
RED BLOOD CELL COUNT: 2.64 10*12/L — ABNORMAL LOW (ref 4.26–5.60)
RED CELL DISTRIBUTION WIDTH: 16.6 % — ABNORMAL HIGH (ref 12.2–15.2)
WBC ADJUSTED: 4.6 10*9/L (ref 3.6–11.2)

## 2021-04-30 MED ADMIN — MORPhine injection 2 mg: 2 mg | INTRAVENOUS | @ 17:00:00 | Stop: 2021-05-09

## 2021-04-30 MED ADMIN — polyethylene glycol (MIRALAX) packet 17 g: 17 g | ORAL | @ 15:00:00 | Stop: 2021-05-04

## 2021-04-30 MED ADMIN — apixaban (ELIQUIS) tablet 5 mg: 5 mg | ORAL

## 2021-04-30 MED ADMIN — dexAMETHasone (DECADRON) tablet 4 mg: 4 mg | ORAL | @ 12:00:00

## 2021-04-30 MED ADMIN — amLODIPine (NORVASC) tablet 5 mg: 5 mg | ORAL | @ 12:00:00

## 2021-04-30 MED ADMIN — apixaban (ELIQUIS) tablet 5 mg: 5 mg | ORAL | @ 12:00:00

## 2021-04-30 MED ADMIN — MORPhine (MS CONTIN) 12 hr tablet 15 mg: 15 mg | ORAL | @ 13:00:00 | Stop: 2021-05-09

## 2021-04-30 MED ADMIN — HYDROmorphone (DILAUDID) tablet 2 mg: 2 mg | ORAL | @ 21:00:00 | Stop: 2021-05-09

## 2021-04-30 MED ADMIN — docusate sodium (COLACE) capsule 100 mg: 100 mg | ORAL | @ 12:00:00

## 2021-04-30 MED ADMIN — MORPhine injection 2 mg: 2 mg | INTRAVENOUS | @ 11:00:00 | Stop: 2021-05-09

## 2021-04-30 MED ADMIN — HYDROmorphone (DILAUDID) tablet 2 mg: 2 mg | ORAL | @ 08:00:00 | Stop: 2021-05-09

## 2021-04-30 MED ADMIN — MORPhine (MS CONTIN) 12 hr tablet 15 mg: 15 mg | ORAL | Stop: 2021-05-09

## 2021-04-30 MED ADMIN — ferrous sulfate tablet 325 mg: 325 mg | ORAL | @ 12:00:00

## 2021-04-30 MED ADMIN — HYDROmorphone (DILAUDID) tablet 2 mg: 2 mg | ORAL | @ 15:00:00 | Stop: 2021-05-09

## 2021-04-30 MED ADMIN — MORPhine injection 2 mg: 2 mg | INTRAVENOUS | Stop: 2021-05-09

## 2021-04-30 MED ADMIN — gabapentin (NEURONTIN) capsule 300 mg: 300 mg | ORAL | @ 12:00:00

## 2021-04-30 MED ADMIN — dexAMETHasone (DECADRON) tablet 4 mg: 4 mg | ORAL

## 2021-04-30 MED ADMIN — gabapentin (NEURONTIN) capsule 300 mg: 300 mg | ORAL

## 2021-04-30 MED ADMIN — atorvastatin (LIPITOR) tablet 40 mg: 40 mg | ORAL | @ 12:00:00

## 2021-04-30 MED ADMIN — Abiraterone (ZYTIGA) tablet 250 mg **Patient Supplied**: 1000 mg | ORAL | @ 12:00:00

## 2021-04-30 MED ADMIN — docusate sodium (COLACE) capsule 100 mg: 100 mg | ORAL

## 2021-04-30 MED ADMIN — senna (SENOKOT) tablet 2 tablet: 2 | ORAL | @ 15:00:00 | Stop: 2021-05-03

## 2021-04-30 NOTE — Unmapped (Signed)
Problem: Adult Inpatient Plan of Care  Goal: Plan of Care Review  Outcome: Progressing  Goal: Patient-Specific Goal (Individualized)  Outcome: Progressing  Goal: Absence of Hospital-Acquired Illness or Injury  Outcome: Progressing  Goal: Optimal Comfort and Wellbeing  Outcome: Progressing  Goal: Readiness for Transition of Care  Outcome: Progressing  Goal: Rounds/Family Conference  Outcome: Progressing     Problem: Fall Injury Risk  Goal: Absence of Fall and Fall-Related Injury  Outcome: Progressing     Problem: VTE (Venous Thromboembolism)  Goal: VTE (Venous Thromboembolism) Symptom Resolution  Outcome: Progressing     Problem: Self-Care Deficit  Goal: Improved Ability to Complete Activities of Daily Living  Outcome: Progressing     Problem: Adult Inpatient Plan of Care  Goal: Absence of Hospital-Acquired Illness or Injury  Outcome: Progressing     Problem: Self-Care Deficit  Goal: Improved Ability to Complete Activities of Daily Living  Outcome: Progressing

## 2021-04-30 NOTE — Unmapped (Signed)
NEUROSURGERY PROGRESS NOTE      Brief History of Present Illness  Kevin Cohen is a 70 y.o. male with PMH of HTN, HLD, and metastatic prostate cancer with known osseous mets (s/p multiple rounds of chemotherapy and radiation), who presents with several month history of symptoms concerning for cervical myelopathy and several week history of worsening LUE weakness. MRI total spine from 9/14 shows diffuse metastatic disease with epidural thickening and spinal canal stenosis from C3-5 without cord signal change, and left foraminal invasion at C5-6. He is admitted for a planned OR 10/6 but holding off on surgery at this point given anatomic challenges.     Subjective/Interval History  Feeling well this morning. Working towards AIR, PT to see again today.    Interval Imaging Reviewed  none    Neurological Assessment and Plan  **Metastatic adenocarcinoma (CMS-HCC)  *Prostate Cancer with mets to cervical spine, recently established with [facility] and getting cancer treatment with hormone-directed therapy and radiation.  -Not a surgical candidate at this time  -Undergoing radiation with rad onc, started 10/6  -consulted PM&R and PT/OT for possible AIR admission to continue radiation while at rehab  - dex 4 mg BID  - scheduled morphine for pain    **Electrolyte status  Replete PRN    Non-neurologic problems being addressed    **DVT Left peroneal and politeal veins dx 04/26/21  -IVC filter placed 10/5 by VIR, needs outpt removal in 3-6 months  -on Eliquis 5mg  po bid    **Anemia  - chronic inflammatory and iron deficient  - iron supplements as tolerated.     **CKD  - baseline Cr 1.2    **HTN  - stable on home meds    Daily Maintenance:  VTE chemoprophylaxis: not needed while on Eliquis  IV Fluids: none  Bowel Regimen: Senna and Miralax  Foley status: none  Nutrition: Reg diet      Disposition: floor, full code, refer to AIR after gets PT/OT eval        For questions regarding patients, please page SRN Inpatient Pager: [phone].    ___________________________________________________________________      Neurological Exam  [name]  Ox3  PERRL  RUE 5 delt/bi/tri/we, 4 hg/io  LUE 2 delt 3 bi 4- tri 4 we/wf/hg/io  BLE 5/5    Right fingertips numb      The patient's vitals, intake/output, labs, orders, and relevant imaging were reviewed for the last 24 hours.    Patient Active Problem List   Diagnosis   ??? Prostate cancer metastatic to bone (CMS-HCC)   ??? Displacement of lumbar intervertebral disc without myelopathy   ??? Metastatic adenocarcinoma (CMS-HCC)   ??? DVT (deep venous thrombosis) (CMS-HCC)   ??? Anemia   ??? Cervical myelopathy with cervical radiculopathy (CMS-HCC)   ??? HTN (hypertension)   ??? CKD (chronic kidney disease)

## 2021-04-30 NOTE — Unmapped (Signed)
Pain has been managed with scheduled and prn medications. Remains free from falls and no signs of new skin breakdown. Tolerating diet. Encouraged to participate in ADL's. Awaiting possible AIR placement. Scheduled to work with PT today. Bed is in its lowest position, non slip socks are on and call light within reach. Will continue care.  Problem: Adult Inpatient Plan of Care  Goal: Plan of Care Review  Outcome: Progressing  Goal: Patient-Specific Goal (Individualized)  Outcome: Progressing  Goal: Absence of Hospital-Acquired Illness or Injury  Outcome: Progressing  Goal: Optimal Comfort and Wellbeing  Outcome: Progressing  Goal: Readiness for Transition of Care  Outcome: Progressing  Goal: Rounds/Family Conference  Outcome: Progressing     Problem: Fall Injury Risk  Goal: Absence of Fall and Fall-Related Injury  Outcome: Progressing     Problem: VTE (Venous Thromboembolism)  Goal: VTE (Venous Thromboembolism) Symptom Resolution  Outcome: Progressing     Problem: Self-Care Deficit  Goal: Improved Ability to Complete Activities of Daily Living  Outcome: Progressing     Problem: Adult Inpatient Plan of Care  Goal: Absence of Hospital-Acquired Illness or Injury  Outcome: Progressing     Problem: Self-Care Deficit  Goal: Improved Ability to Complete Activities of Daily Living  Outcome: Progressing

## 2021-05-01 ENCOUNTER — Ambulatory Visit: Admit: 2021-05-01 | Discharge: 2021-05-02

## 2021-05-01 LAB — CBC
HEMATOCRIT: 24.9 % — ABNORMAL LOW (ref 39.0–48.0)
HEMOGLOBIN: 8.4 g/dL — ABNORMAL LOW (ref 12.9–16.5)
MEAN CORPUSCULAR HEMOGLOBIN CONC: 33.7 g/dL (ref 32.0–36.0)
MEAN CORPUSCULAR HEMOGLOBIN: 31 pg (ref 25.9–32.4)
MEAN CORPUSCULAR VOLUME: 92.2 fL (ref 77.6–95.7)
MEAN PLATELET VOLUME: 7.9 fL (ref 6.8–10.7)
PLATELET COUNT: 147 10*9/L — ABNORMAL LOW (ref 150–450)
RED BLOOD CELL COUNT: 2.7 10*12/L — ABNORMAL LOW (ref 4.26–5.60)
RED CELL DISTRIBUTION WIDTH: 16.3 % — ABNORMAL HIGH (ref 12.2–15.2)
WBC ADJUSTED: 3.8 10*9/L (ref 3.6–11.2)

## 2021-05-01 LAB — BASIC METABOLIC PANEL
ANION GAP: 7 mmol/L (ref 5–14)
BLOOD UREA NITROGEN: 30 mg/dL — ABNORMAL HIGH (ref 9–23)
BUN / CREAT RATIO: 28
CALCIUM: 8.8 mg/dL (ref 8.7–10.4)
CHLORIDE: 103 mmol/L (ref 98–107)
CO2: 26 mmol/L (ref 20.0–31.0)
CREATININE: 1.06 mg/dL
EGFR CKD-EPI (2021) MALE: 76 mL/min/{1.73_m2} (ref >=60)
GLUCOSE RANDOM: 129 mg/dL (ref 70–179)
POTASSIUM: 4.4 mmol/L (ref 3.4–4.8)
SODIUM: 136 mmol/L (ref 135–145)

## 2021-05-01 LAB — MAGNESIUM: MAGNESIUM: 2.2 mg/dL (ref 1.6–2.6)

## 2021-05-01 LAB — PHOSPHORUS: PHOSPHORUS: 2.9 mg/dL (ref 2.4–5.1)

## 2021-05-01 MED ADMIN — apixaban (ELIQUIS) tablet 5 mg: 5 mg | ORAL | @ 13:00:00

## 2021-05-01 MED ADMIN — docusate sodium (COLACE) capsule 100 mg: 100 mg | ORAL | @ 13:00:00

## 2021-05-01 MED ADMIN — gabapentin (NEURONTIN) capsule 300 mg: 300 mg | ORAL | @ 01:00:00

## 2021-05-01 MED ADMIN — MORPhine injection 2 mg: 2 mg | INTRAVENOUS | @ 22:00:00 | Stop: 2021-05-09

## 2021-05-01 MED ADMIN — gabapentin (NEURONTIN) capsule 300 mg: 300 mg | ORAL | @ 13:00:00

## 2021-05-01 MED ADMIN — Abiraterone (ZYTIGA) tablet 250 mg **Patient Supplied**: 1000 mg | ORAL | @ 13:00:00

## 2021-05-01 MED ADMIN — amLODIPine (NORVASC) tablet 5 mg: 5 mg | ORAL | @ 13:00:00

## 2021-05-01 MED ADMIN — dexAMETHasone (DECADRON) tablet 4 mg: 4 mg | ORAL | @ 01:00:00

## 2021-05-01 MED ADMIN — MORPhine (MS CONTIN) 12 hr tablet 15 mg: 15 mg | ORAL | @ 01:00:00 | Stop: 2021-05-09

## 2021-05-01 MED ADMIN — polyethylene glycol (MIRALAX) packet 17 g: 17 g | ORAL | @ 16:00:00 | Stop: 2021-05-04

## 2021-05-01 MED ADMIN — apixaban (ELIQUIS) tablet 5 mg: 5 mg | ORAL | @ 01:00:00

## 2021-05-01 MED ADMIN — docusate sodium (COLACE) capsule 100 mg: 100 mg | ORAL | @ 01:00:00

## 2021-05-01 MED ADMIN — dexAMETHasone (DECADRON) tablet 4 mg: 4 mg | ORAL | @ 13:00:00

## 2021-05-01 MED ADMIN — atorvastatin (LIPITOR) tablet 40 mg: 40 mg | ORAL | @ 13:00:00

## 2021-05-01 MED ADMIN — HYDROmorphone (DILAUDID) tablet 2 mg: 2 mg | ORAL | @ 19:00:00 | Stop: 2021-05-09

## 2021-05-01 MED ADMIN — MORPhine injection 2 mg: 2 mg | INTRAVENOUS | @ 01:00:00 | Stop: 2021-05-09

## 2021-05-01 MED ADMIN — senna (SENOKOT) tablet 2 tablet: 2 | ORAL | @ 16:00:00 | Stop: 2021-05-03

## 2021-05-01 MED ADMIN — MORPhine injection 2 mg: 2 mg | INTRAVENOUS | @ 13:00:00 | Stop: 2021-05-09

## 2021-05-01 MED ADMIN — MORPhine (MS CONTIN) 12 hr tablet 15 mg: 15 mg | ORAL | @ 13:00:00 | Stop: 2021-05-09

## 2021-05-01 NOTE — Unmapped (Signed)
Pt has not had a fall during this shift. Pt has shown no s/sx of DVT and is on prophylactic Eliquis. Pt's pain has been well managed with PRN pain meds.  Pt has shown no s/sx of skin breakdown during this shift. Discharge needs will be assessed prior to discharge. Will continue to monitor.       Problem: Adult Inpatient Plan of Care  Goal: Plan of Care Review  Outcome: Progressing  Goal: Patient-Specific Goal (Individualized)  Outcome: Progressing  Goal: Absence of Hospital-Acquired Illness or Injury  Outcome: Progressing  Intervention: Identify and Manage Fall Risk  Recent Flowsheet Documentation  Taken 05/01/2021 1453 by Marlinda Mike, RN  Safety Interventions:   fall reduction program maintained   low bed   nonskid shoes/slippers when out of bed   room near unit station  Taken 05/01/2021 0730 by Marlinda Mike, RN  Safety Interventions:   fall reduction program maintained   low bed   nonskid shoes/slippers when out of bed   room near unit station  Intervention: Prevent and Manage VTE (Venous Thromboembolism) Risk  Recent Flowsheet Documentation  Taken 05/01/2021 1453 by Marlinda Mike, RN  VTE Prevention/Management:   anticoagulant therapy   dorsiflexion/plantar flexion performed   fluids promoted  Goal: Optimal Comfort and Wellbeing  Outcome: Progressing  Goal: Readiness for Transition of Care  Outcome: Progressing  Goal: Rounds/Family Conference  Outcome: Progressing     Problem: Fall Injury Risk  Goal: Absence of Fall and Fall-Related Injury  Outcome: Progressing  Intervention: Identify and Manage Contributors  Flowsheets (Taken 05/01/2021 1453)  Medication Review/Management:   medications reviewed   high-risk medications identified  Self-Care Promotion:   independence encouraged   BADL personal objects within reach   BADL personal routines maintained  Intervention: Promote Injury-Free Environment  Flowsheets  Taken 05/01/2021 1453  Safety Interventions:   fall reduction program maintained   low bed   nonskid shoes/slippers when out of bed   room near unit station  Taken 05/01/2021 0730  Safety Interventions:   fall reduction program maintained   low bed   nonskid shoes/slippers when out of bed   room near unit station     Problem: VTE (Venous Thromboembolism)  Goal: VTE (Venous Thromboembolism) Symptom Resolution  Outcome: Progressing  Intervention: Prevent or Manage VTE (Venous Thromboembolism)  Flowsheets (Taken 05/01/2021 1453)  Bleeding Precautions: blood pressure closely monitored  VTE Prevention/Management:   anticoagulant therapy   dorsiflexion/plantar flexion performed   fluids promoted  Anti-Embolism Device Type: SCD, Knee  Anti-Embolism Intervention: Refused  Anti-Embolism Device Location: BLE     Problem: Self-Care Deficit  Goal: Improved Ability to Complete Activities of Daily Living  Outcome: Progressing  Intervention: Promote Activity and Functional Independence  Flowsheets (Taken 05/01/2021 1453)  Activity Assistance Provided: assistance, 2 people  Self-Care Promotion:   independence encouraged   BADL personal objects within reach   BADL personal routines maintained  Cognitive Support Measures: physical activity promoted     Problem: Adult Inpatient Plan of Care  Goal: Absence of Hospital-Acquired Illness or Injury  Outcome: Progressing  Intervention: Identify and Manage Fall Risk  Flowsheets  Taken 05/01/2021 1453  Safety Interventions:   fall reduction program maintained   low bed   nonskid shoes/slippers when out of bed   room near unit station  Taken 05/01/2021 0730  Safety Interventions:   fall reduction program maintained   low bed   nonskid shoes/slippers when out of bed   room near unit station  Problem: Self-Care Deficit  Goal: Improved Ability to Complete Activities of Daily Living  Outcome: Progressing  Intervention: Promote Activity and Functional Independence  Flowsheets (Taken 05/01/2021 1453)  Activity Assistance Provided: assistance, 2 people  Self-Care Promotion: independence encouraged   BADL personal objects within reach   BADL personal routines maintained  Cognitive Support Measures: physical activity promoted

## 2021-05-01 NOTE — Unmapped (Cosign Needed)
NEUROSURGERY PROGRESS NOTE      Brief History of Present Illness  Kevin Cohen is a 70 y.o. male with PMH of HTN, HLD, and metastatic prostate cancer with known osseous mets (s/p multiple rounds of chemotherapy and radiation), who presents with several month history of symptoms concerning for cervical myelopathy and several week history of worsening LUE weakness. MRI total spine from 9/14 shows diffuse metastatic disease with epidural thickening and spinal canal stenosis from C3-5 without cord signal change, and left foraminal invasion at C5-6. He is admitted for a planned OR 10/6 but holding off on surgery at this point given anatomic challenges.     Subjective/Interval History  Stable on the floors overnight.     Interval Imaging Reviewed  none    Neurological Assessment and Plan  **Metastatic adenocarcinoma (CMS-HCC)  *Prostate Cancer with mets to cervical spine, recently established with [facility] and getting cancer treatment with hormone-directed therapy and radiation.  -Not a surgical candidate at this time  -Undergoing radiation with rad onc, started 10/6  -consulted PM&R and PT/OT for possible AIR admission to continue radiation while at rehab  - dex 4 mg BID  - scheduled morphine for pain    **Electrolyte status  Replete PRN    Non-neurologic problems being addressed    **DVT Left peroneal and politeal veins dx 04/26/21  -IVC filter placed 10/5 by VIR, needs outpt removal in 3-6 months  -on Eliquis 5mg  po bid    **Anemia  - chronic inflammatory and iron deficient  - iron supplements as tolerated.     **CKD  - baseline Cr 1.2    **HTN  - stable on home meds    Daily Maintenance:  VTE chemoprophylaxis: not needed while on Eliquis  IV Fluids: none  Bowel Regimen: Senna and Miralax  Foley status: none  Nutrition: Reg diet      Disposition: floor, full code, refer to AIR after gets PT/OT eval        For questions regarding patients, please page SRN Inpatient Pager: [phone].    ___________________________________________________________________      Neurological Exam  [name]  Ox3  PERRL  RUE 5 delt/bi/tri/we, 4 hg/io  LUE 2 delt 3 bi 4- tri 4 we/wf/hg/io  BLE 5/5    Right fingertips numb      The patient's vitals, intake/output, labs, orders, and relevant imaging were reviewed for the last 24 hours.    Patient Active Problem List   Diagnosis   ??? Prostate cancer metastatic to bone (CMS-HCC)   ??? Displacement of lumbar intervertebral disc without myelopathy   ??? Metastatic adenocarcinoma (CMS-HCC)   ??? DVT (deep venous thrombosis) (CMS-HCC)   ??? Anemia   ??? Cervical myelopathy with cervical radiculopathy (CMS-HCC)   ??? HTN (hypertension)   ??? CKD (chronic kidney disease)

## 2021-05-02 ENCOUNTER — Ambulatory Visit: Admit: 2021-05-02 | Discharge: 2021-05-03

## 2021-05-02 MED ADMIN — apixaban (ELIQUIS) tablet 5 mg: 5 mg | ORAL | @ 13:00:00

## 2021-05-02 MED ADMIN — dexAMETHasone (DECADRON) tablet 4 mg: 4 mg | ORAL | @ 02:00:00

## 2021-05-02 MED ADMIN — HYDROmorphone (DILAUDID) tablet 2 mg: 2 mg | ORAL | @ 21:00:00 | Stop: 2021-05-09

## 2021-05-02 MED ADMIN — apixaban (ELIQUIS) tablet 5 mg: 5 mg | ORAL | @ 02:00:00

## 2021-05-02 MED ADMIN — dexAMETHasone (DECADRON) tablet 4 mg: 4 mg | ORAL | @ 13:00:00 | Stop: 2021-05-02

## 2021-05-02 MED ADMIN — Abiraterone (ZYTIGA) tablet 250 mg **Patient Supplied**: 1000 mg | ORAL | @ 13:00:00

## 2021-05-02 MED ADMIN — amLODIPine (NORVASC) tablet 5 mg: 5 mg | ORAL | @ 13:00:00

## 2021-05-02 MED ADMIN — gabapentin (NEURONTIN) capsule 300 mg: 300 mg | ORAL | @ 02:00:00

## 2021-05-02 MED ADMIN — docusate sodium (COLACE) capsule 100 mg: 100 mg | ORAL | @ 02:00:00

## 2021-05-02 MED ADMIN — MORPhine injection 2 mg: 2 mg | INTRAVENOUS | @ 08:00:00 | Stop: 2021-05-09

## 2021-05-02 MED ADMIN — MORPhine (MS CONTIN) 12 hr tablet 15 mg: 15 mg | ORAL | @ 02:00:00 | Stop: 2021-05-09

## 2021-05-02 MED ADMIN — MORPhine (MS CONTIN) 12 hr tablet 15 mg: 15 mg | ORAL | @ 13:00:00 | Stop: 2021-05-09

## 2021-05-02 MED ADMIN — ferrous sulfate tablet 325 mg: 325 mg | ORAL | @ 13:00:00

## 2021-05-02 MED ADMIN — gabapentin (NEURONTIN) capsule 300 mg: 300 mg | ORAL | @ 13:00:00

## 2021-05-02 MED ADMIN — atorvastatin (LIPITOR) tablet 40 mg: 40 mg | ORAL | @ 13:00:00

## 2021-05-02 MED ADMIN — MORPhine injection 2 mg: 2 mg | INTRAVENOUS | @ 04:00:00 | Stop: 2021-05-09

## 2021-05-02 MED ADMIN — MORPhine injection 2 mg: 2 mg | INTRAVENOUS | @ 16:00:00 | Stop: 2021-05-09

## 2021-05-02 NOTE — Unmapped (Signed)
NEUROSURGERY PROGRESS NOTE      Brief History of Present Illness  Kevin Cohen is a 70 y.o. male with PMH of HTN, HLD, and metastatic prostate cancer with known osseous mets (s/p multiple rounds of chemotherapy and radiation), who presents with several month history of symptoms concerning for cervical myelopathy and several week history of worsening LUE weakness. MRI total spine from 9/14 shows diffuse metastatic disease with epidural thickening and spinal canal stenosis from C3-5 without cord signal change, and left foraminal invasion at C5-6. He is admitted for a planned OR 10/6 but holding off on surgery at this point given anatomic challenges.     Subjective/Interval History  Stable on the floors overnight.     Interval Imaging Reviewed  none    Neurological Assessment and Plan  **Metastatic adenocarcinoma (CMS-HCC)  *Prostate Cancer with mets to cervical spine, recently established with [facility] and getting cancer treatment with hormone-directed therapy and radiation.  -Not a surgical candidate at this time  -Undergoing radiation with rad onc, started 10/6; plan to go till 10/19  -consulted PM&R and PT/OT for possible AIR admission to continue radiation while at rehab  - dex 4 mg BID will need to taper to prednisone 5mg  daily while on abiraterone per onc  - scheduled morphine for pain    **Electrolyte status  Replete PRN    Non-neurologic problems being addressed    **DVT Left peroneal and politeal veins dx 04/26/21  -IVC filter placed 10/5 by VIR, needs outpt removal in 3-6 months  -on Eliquis 5mg  po bid    **Anemia  - chronic inflammatory and iron deficient  - iron supplements as tolerated.     **CKD  - baseline Cr 1.2    **HTN  - stable on home meds    Daily Maintenance:  VTE chemoprophylaxis: not needed while on Eliquis  IV Fluids: none  Bowel Regimen: Senna and Miralax  Foley status: none  Nutrition: Reg diet      Disposition: floor, full code, refer to AIR after gets PT/OT eval        For questions regarding patients, please page SRN Inpatient Pager: [phone].    ___________________________________________________________________      Neurological Exam  [name]  Ox3  PERRL  RUE 5 delt/bi/tri/we, 4 hg/io  LUE 2 delt 3 bi 4- tri 4 we/wf/hg/io  BLE 5/5    Right fingertips numb      The patient's vitals, intake/output, labs, orders, and relevant imaging were reviewed for the last 24 hours.    Patient Active Problem List   Diagnosis   ??? Prostate cancer metastatic to bone (CMS-HCC)   ??? Displacement of lumbar intervertebral disc without myelopathy   ??? Metastatic adenocarcinoma (CMS-HCC)   ??? DVT (deep venous thrombosis) (CMS-HCC)   ??? Anemia   ??? Cervical myelopathy with cervical radiculopathy (CMS-HCC)   ??? HTN (hypertension)   ??? CKD (chronic kidney disease)

## 2021-05-02 NOTE — Unmapped (Signed)
Patient remains inpatient status. In recovery room for weekly status check. Drs. Repka and Young at bedside to assess, all questions answered. Patient transported back to room via stretcher with patient transport.    Rx:C4-T2 Spin: 05/01/2021: 900/3,000 cGy

## 2021-05-02 NOTE — Unmapped (Signed)
Falls precautions maintained. VSS. Pain managed with PRNs. Encouraged to participate in daily cares. Safety precautions in place. Will continue to monitor.     Problem: Adult Inpatient Plan of Care  Goal: Plan of Care Review  05/02/2021 0500 by Oretha Caprice, RN  Outcome: Progressing  05/02/2021 0413 by Oretha Caprice, RN  Outcome: Progressing  Goal: Patient-Specific Goal (Individualized)  05/02/2021 0500 by Oretha Caprice, RN  Outcome: Progressing  05/02/2021 0413 by Oretha Caprice, RN  Outcome: Progressing  Goal: Absence of Hospital-Acquired Illness or Injury  05/02/2021 0500 by Oretha Caprice, RN  Outcome: Progressing  05/02/2021 0413 by Oretha Caprice, RN  Outcome: Progressing  Intervention: Identify and Manage Fall Risk  Recent Flowsheet Documentation  Taken 05/01/2021 2000 by Oretha Caprice, RN  Safety Interventions:   fall reduction program maintained   lighting adjusted for tasks/safety   low bed  Goal: Optimal Comfort and Wellbeing  05/02/2021 0500 by Oretha Caprice, RN  Outcome: Progressing  05/02/2021 0413 by Oretha Caprice, RN  Outcome: Progressing  Goal: Readiness for Transition of Care  05/02/2021 0500 by Oretha Caprice, RN  Outcome: Progressing  05/02/2021 0413 by Oretha Caprice, RN  Outcome: Progressing  Goal: Rounds/Family Conference  05/02/2021 0500 by Oretha Caprice, RN  Outcome: Progressing  05/02/2021 0413 by Oretha Caprice, RN  Outcome: Progressing     Problem: Fall Injury Risk  Goal: Absence of Fall and Fall-Related Injury  05/02/2021 0500 by Oretha Caprice, RN  Outcome: Progressing  05/02/2021 0413 by Oretha Caprice, RN  Outcome: Progressing  Intervention: Promote Injury-Free Environment  Recent Flowsheet Documentation  Taken 05/01/2021 2000 by Oretha Caprice, RN  Safety Interventions:   fall reduction program maintained   lighting adjusted for tasks/safety   low bed     Problem: VTE (Venous Thromboembolism)  Goal: VTE (Venous Thromboembolism) Symptom Resolution  05/02/2021 0500 by Oretha Caprice, RN  Outcome: Progressing  05/02/2021 0413 by Oretha Caprice, RN  Outcome: Progressing  Intervention: Prevent or Manage VTE (Venous Thromboembolism)  Recent Flowsheet Documentation  Taken 05/01/2021 2000 by Oretha Caprice, RN  Anti-Embolism Device Type: SCD, Knee  Anti-Embolism Intervention: Refused  Anti-Embolism Device Location: BLE     Problem: Self-Care Deficit  Goal: Improved Ability to Complete Activities of Daily Living  05/02/2021 0500 by Oretha Caprice, RN  Outcome: Progressing  05/02/2021 0413 by Oretha Caprice, RN  Outcome: Progressing     Problem: Adult Inpatient Plan of Care  Goal: Absence of Hospital-Acquired Illness or Injury  05/02/2021 0500 by Oretha Caprice, RN  Outcome: Progressing  05/02/2021 0413 by Oretha Caprice, RN  Outcome: Progressing  Intervention: Identify and Manage Fall Risk  Recent Flowsheet Documentation  Taken 05/01/2021 2000 by Oretha Caprice, RN  Safety Interventions:   fall reduction program maintained   lighting adjusted for tasks/safety   low bed     Problem: Self-Care Deficit  Goal: Improved Ability to Complete Activities of Daily Living  05/02/2021 0500 by Oretha Caprice, RN  Outcome: Progressing  05/02/2021 0413 by Oretha Caprice, RN  Outcome: Progressing

## 2021-05-02 NOTE — Unmapped (Signed)
Patient is Kevin Cohen, urinal, BSC-1 assist, LUE weakness, pain controlled with prn medication and scheduled medication, good po intake, q4 neuro assessments, strict I/O's, will continue to monitor.       Problem: Adult Inpatient Plan of Care  Goal: Plan of Care Review  Outcome: Progressing  Goal: Patient-Specific Goal (Individualized)  Outcome: Progressing  Goal: Absence of Hospital-Acquired Illness or Injury  Outcome: Progressing  Intervention: Identify and Manage Fall Risk  Recent Flowsheet Documentation  Taken 05/02/2021 0936 by Elenora Fender, RN  Safety Interventions:   bed alarm   fall reduction program maintained   lighting adjusted for tasks/safety   low bed   nonskid shoes/slippers when out of bed  Goal: Optimal Comfort and Wellbeing  Outcome: Progressing  Goal: Readiness for Transition of Care  Outcome: Progressing  Goal: Rounds/Family Conference  Outcome: Progressing     Problem: Fall Injury Risk  Goal: Absence of Fall and Fall-Related Injury  Outcome: Progressing  Intervention: Promote Injury-Free Environment  Recent Flowsheet Documentation  Taken 05/02/2021 0936 by Elenora Fender, RN  Safety Interventions:   bed alarm   fall reduction program maintained   lighting adjusted for tasks/safety   low bed   nonskid shoes/slippers when out of bed     Problem: VTE (Venous Thromboembolism)  Goal: VTE (Venous Thromboembolism) Symptom Resolution  Outcome: Progressing     Problem: Self-Care Deficit  Goal: Improved Ability to Complete Activities of Daily Living  Outcome: Progressing     Problem: Adult Inpatient Plan of Care  Goal: Absence of Hospital-Acquired Illness or Injury  Outcome: Progressing  Intervention: Identify and Manage Fall Risk  Recent Flowsheet Documentation  Taken 05/02/2021 0936 by Elenora Fender, RN  Safety Interventions:   bed alarm   fall reduction program maintained   lighting adjusted for tasks/safety   low bed   nonskid shoes/slippers when out of bed     Problem: Self-Care Deficit  Goal: Improved Ability to Complete Activities of Daily Living  Outcome: Progressing

## 2021-05-02 NOTE — Unmapped (Incomplete)
Problem: Adult Inpatient Plan of Care  Goal: Plan of Care Review  Outcome: Progressing  Goal: Patient-Specific Goal (Individualized)  Outcome: Progressing  Goal: Absence of Hospital-Acquired Illness or Injury  Outcome: Progressing  Intervention: Identify and Manage Fall Risk  Recent Flowsheet Documentation  Taken 05/01/2021 2000 by Oretha Caprice, RN  Safety Interventions:   fall reduction program maintained   lighting adjusted for tasks/safety   low bed  Goal: Optimal Comfort and Wellbeing  Outcome: Progressing  Goal: Readiness for Transition of Care  Outcome: Progressing  Goal: Rounds/Family Conference  Outcome: Progressing     Problem: Fall Injury Risk  Goal: Absence of Fall and Fall-Related Injury  Outcome: Progressing  Intervention: Promote Injury-Free Environment  Recent Flowsheet Documentation  Taken 05/01/2021 2000 by Oretha Caprice, RN  Safety Interventions:   fall reduction program maintained   lighting adjusted for tasks/safety   low bed     Problem: VTE (Venous Thromboembolism)  Goal: VTE (Venous Thromboembolism) Symptom Resolution  Outcome: Progressing  Intervention: Prevent or Manage VTE (Venous Thromboembolism)  Recent Flowsheet Documentation  Taken 05/01/2021 2000 by Oretha Caprice, RN  Anti-Embolism Device Type: SCD, Knee  Anti-Embolism Intervention: Refused  Anti-Embolism Device Location: BLE     Problem: Self-Care Deficit  Goal: Improved Ability to Complete Activities of Daily Living  Outcome: Progressing     Problem: Adult Inpatient Plan of Care  Goal: Absence of Hospital-Acquired Illness or Injury  Outcome: Progressing  Intervention: Identify and Manage Fall Risk  Recent Flowsheet Documentation  Taken 05/01/2021 2000 by Oretha Caprice, RN  Safety Interventions:   fall reduction program maintained   lighting adjusted for tasks/safety   low bed     Problem: Self-Care Deficit  Goal: Improved Ability to Complete Activities of Daily Living  Outcome: Progressing

## 2021-05-02 NOTE — Unmapped (Signed)
Radiation Treatment Management Note    VISIT DATE: 05/02/2021    Diagnosis:    1. Metastatic adenocarcinoma (CMS-HCC)        ASSESSMENT AND PLAN:   Mr. Olshefski 70 y.o. male with metastatic prostate cancer with known osseous mets (s/p multiple rounds of chemotherapy and radiation to prostate and Spine T5-T7). Now with cervical myelopathy and several week history of worsening LUE weakness.  NSG recommended no surgical intervention at this time due to foraminal invasion and extent outside of spine.       CURRENT TREATMENT:   Cumulative dose 1200 cGy/ 3000 cGy  Fraction 4 / 10.    Cancer  We will continue on with the course of treatment as planned.  I have reviewed and approved the appropriate quality assurance and targeting imaging.      2. Toxicity  None    3. Follow Up:  We will reassess next week.    Subjective:   He is doing well at the beginning of his treatments.  He has some discomfort with treatment position but otherwise is tolerating it well.  He states that he has some improved left shoulder pain.  He denies any changes to sensation or muscle strength in either his left or right extremities.    PHYSICAL EXAMINATION:  There were no vitals taken for this visit.  Karnofsky/Lansky Performance Status:  70, Cares for self; unable to carry on normal activity or to do active work (ECOG equivalent 1)  General/Constitutional: Well-appearing, NAD   HEENT: Normocephalic, atraumatic, no scleral icterus   Skin: No suspicious lesions or rashes  Pulmonary: No respiratory distress or increased work of breathing   Abdominal: Non distended  Musculoskeletal: Full range of motion in the extremities, without edema   Neurologic: Alert and oriented to conversation.  Cranial nerves III through XII intact.  Left shoulder flexion, abduction /left elbow flexion/left grip strength all 2 out of 5.  Decreased sensation in distal left hand.   Right hand demonstrates 4 out of 5 hand grip strength.  Otherwise strength and sensation normal throughout.  Psychiatric: Appropriate affect and judgement      Electronically Signed:  Sunday Shams, MD  Radiation Oncology Pgy-3  [facility]    Attending Attestation    I saw and evaluated/examined the patient, participating in the key portions of the service.  I discussed the findings, assessment, and plan of care with the resident/PA/NP.  I agree with the findings and plan as documented in their note.    Kayleen Memos. Camelia Phenes, MD  Assistant Professor  Department of Radiation Oncology  [facility]  [address], [id number]  [place], [place] [address]  O: [phone]  05/03/21 8:24 AM

## 2021-05-03 ENCOUNTER — Ambulatory Visit: Admit: 2021-05-03 | Discharge: 2021-05-04

## 2021-05-03 LAB — BASIC METABOLIC PANEL
ANION GAP: 8 mmol/L (ref 5–14)
BLOOD UREA NITROGEN: 33 mg/dL — ABNORMAL HIGH (ref 9–23)
BUN / CREAT RATIO: 31
CALCIUM: 8.5 mg/dL — ABNORMAL LOW (ref 8.7–10.4)
CHLORIDE: 103 mmol/L (ref 98–107)
CO2: 24 mmol/L (ref 20.0–31.0)
CREATININE: 1.07 mg/dL
EGFR CKD-EPI (2021) MALE: 76 mL/min/{1.73_m2} (ref >=60)
GLUCOSE RANDOM: 114 mg/dL (ref 70–179)
POTASSIUM: 4.3 mmol/L (ref 3.4–4.8)
SODIUM: 135 mmol/L (ref 135–145)

## 2021-05-03 LAB — CBC
HEMATOCRIT: 24.6 % — ABNORMAL LOW (ref 39.0–48.0)
HEMOGLOBIN: 8.2 g/dL — ABNORMAL LOW (ref 12.9–16.5)
MEAN CORPUSCULAR HEMOGLOBIN CONC: 33.3 g/dL (ref 32.0–36.0)
MEAN CORPUSCULAR HEMOGLOBIN: 30.8 pg (ref 25.9–32.4)
MEAN CORPUSCULAR VOLUME: 92.4 fL (ref 77.6–95.7)
MEAN PLATELET VOLUME: 8.4 fL (ref 6.8–10.7)
PLATELET COUNT: 141 10*9/L — ABNORMAL LOW (ref 150–450)
RED BLOOD CELL COUNT: 2.66 10*12/L — ABNORMAL LOW (ref 4.26–5.60)
RED CELL DISTRIBUTION WIDTH: 17 % — ABNORMAL HIGH (ref 12.2–15.2)
WBC ADJUSTED: 5.8 10*9/L (ref 3.6–11.2)

## 2021-05-03 LAB — PHOSPHORUS: PHOSPHORUS: 2.9 mg/dL (ref 2.4–5.1)

## 2021-05-03 LAB — MAGNESIUM: MAGNESIUM: 2.1 mg/dL (ref 1.6–2.6)

## 2021-05-03 MED ADMIN — atorvastatin (LIPITOR) tablet 40 mg: 40 mg | ORAL | @ 13:00:00

## 2021-05-03 MED ADMIN — DULoxetine (CYMBALTA) DR capsule 30 mg: 30 mg | ORAL | @ 21:00:00 | Stop: 2021-05-10

## 2021-05-03 MED ADMIN — lidocaine (LIDODERM) 5 % patch 2 patch: 2 | TRANSDERMAL | @ 21:00:00

## 2021-05-03 MED ADMIN — MORPhine (MS CONTIN) 12 hr tablet 15 mg: 15 mg | ORAL | @ 13:00:00 | Stop: 2021-05-09

## 2021-05-03 MED ADMIN — MORPhine (MS CONTIN) 12 hr tablet 15 mg: 15 mg | ORAL | @ 03:00:00 | Stop: 2021-05-09

## 2021-05-03 MED ADMIN — predniSONE (DELTASONE) tablet 40 mg: 40 mg | ORAL | @ 13:00:00 | Stop: 2021-05-06

## 2021-05-03 MED ADMIN — docusate sodium (COLACE) capsule 100 mg: 100 mg | ORAL | @ 03:00:00

## 2021-05-03 MED ADMIN — gabapentin (NEURONTIN) capsule 300 mg: 300 mg | ORAL | @ 03:00:00

## 2021-05-03 MED ADMIN — MORPhine injection 2 mg: 2 mg | INTRAVENOUS | @ 04:00:00 | Stop: 2021-05-03

## 2021-05-03 MED ADMIN — dexAMETHasone (DECADRON) tablet 4 mg: 4 mg | ORAL | @ 03:00:00 | Stop: 2021-05-02

## 2021-05-03 MED ADMIN — gabapentin (NEURONTIN) capsule 300 mg: 300 mg | ORAL | @ 13:00:00 | Stop: 2021-05-03

## 2021-05-03 MED ADMIN — apixaban (ELIQUIS) tablet 5 mg: 5 mg | ORAL | @ 13:00:00

## 2021-05-03 MED ADMIN — oxyCODONE (ROXICODONE) immediate release tablet 10 mg: 10 mg | ORAL | @ 21:00:00 | Stop: 2021-05-10

## 2021-05-03 MED ADMIN — HYDROmorphone (DILAUDID) tablet 2 mg: 2 mg | ORAL | @ 08:00:00 | Stop: 2021-05-03

## 2021-05-03 MED ADMIN — Abiraterone (ZYTIGA) tablet 250 mg **Patient Supplied**: 1000 mg | ORAL | @ 13:00:00

## 2021-05-03 MED ADMIN — MORPhine injection 2 mg: 2 mg | INTRAVENOUS | @ 09:00:00 | Stop: 2021-05-03

## 2021-05-03 MED ADMIN — apixaban (ELIQUIS) tablet 5 mg: 5 mg | ORAL | @ 03:00:00

## 2021-05-03 MED ADMIN — amLODIPine (NORVASC) tablet 5 mg: 5 mg | ORAL | @ 13:00:00

## 2021-05-03 NOTE — Unmapped (Signed)
Patient's vital signs stable. Patient had radiation this morning. Patient worked with occupational therapy today and sat on side of bed and stood multiple times. Patient remains weak in left arm and reports tingling in left fingers. Patient tolerating solid food and requires set up. Patient continues to report pain in left upper arm that was controlled with scheduled pain medications.    Problem: Adult Inpatient Plan of Care  Goal: Plan of Care Review  Outcome: Progressing  Goal: Patient-Specific Goal (Individualized)  Outcome: Progressing  Goal: Absence of Hospital-Acquired Illness or Injury  Outcome: Progressing  Intervention: Identify and Manage Fall Risk  Recent Flowsheet Documentation  Taken 05/03/2021 0901 by John Giovanni, RN  Safety Interventions:   fall reduction program maintained   bed alarm  Intervention: Prevent and Manage VTE (Venous Thromboembolism) Risk  Recent Flowsheet Documentation  Taken 05/03/2021 0910 by John Giovanni, RN  VTE Prevention/Management: ambulation promoted  Goal: Optimal Comfort and Wellbeing  Outcome: Progressing  Goal: Readiness for Transition of Care  Outcome: Progressing  Goal: Rounds/Family Conference  Outcome: Progressing     Problem: Fall Injury Risk  Goal: Absence of Fall and Fall-Related Injury  Outcome: Progressing  Intervention: Promote Injury-Free Environment  Recent Flowsheet Documentation  Taken 05/03/2021 0901 by John Giovanni, RN  Safety Interventions:   fall reduction program maintained   bed alarm     Problem: VTE (Venous Thromboembolism)  Goal: VTE (Venous Thromboembolism) Symptom Resolution  Outcome: Progressing  Intervention: Prevent or Manage VTE (Venous Thromboembolism)  Recent Flowsheet Documentation  Taken 05/03/2021 0910 by John Giovanni, RN  VTE Prevention/Management: ambulation promoted  Anti-Embolism Device Type: SCD, Knee  Anti-Embolism Intervention: Refused  Anti-Embolism Device Location: BLE     Problem: Self-Care Deficit  Goal: Improved Ability to Complete Activities of Daily Living  Outcome: Progressing     Problem: Adult Inpatient Plan of Care  Goal: Absence of Hospital-Acquired Illness or Injury  Outcome: Progressing  Intervention: Identify and Manage Fall Risk  Recent Flowsheet Documentation  Taken 05/03/2021 0901 by John Giovanni, RN  Safety Interventions:   fall reduction program maintained   bed alarm     Problem: Self-Care Deficit  Goal: Improved Ability to Complete Activities of Daily Living  Outcome: Progressing

## 2021-05-03 NOTE — Unmapped (Signed)
NEUROSURGERY PROGRESS NOTE      Brief History of Present Illness  Kevin Cohen is a 70 y.o. male with PMH of HTN, HLD, and metastatic prostate cancer with known osseous mets (s/p multiple rounds of chemotherapy and radiation), who presents with several month history of symptoms concerning for cervical myelopathy and several week history of worsening LUE weakness. MRI total spine from 9/14 shows diffuse metastatic disease with epidural thickening and spinal canal stenosis from C3-5 without cord signal change, and left foraminal invasion at C5-6. He is admitted for a planned OR 10/6 but holding off on surgery at this point given anatomic challenges.     Subjective/Interval History  Stable on the floors overnight.     Interval Imaging Reviewed  none    Neurological Assessment and Plan  **Metastatic adenocarcinoma (CMS-HCC)  *Prostate Cancer with mets to cervical spine, recently established with [facility] and getting cancer treatment with hormone-directed therapy and radiation.  -Not a surgical candidate at this time  -Undergoing radiation with rad onc, started 10/6; plan to go till 10/19  -consulted PM&R and PT/OT for possible AIR admission to continue radiation while at rehab  - Prednisone taper 40mg  10/15; 30mg  10/15; 20mg  10/18; 10mg  10/21 and 5mg  10/24  - scheduled morphine for pain    **Electrolyte status  Replete PRN    Non-neurologic problems being addressed    **DVT Left peroneal and politeal veins dx 04/26/21  -IVC filter placed 10/5 by VIR, needs outpt removal in 3-6 months  -on Eliquis 5mg  po bid    **Anemia  - chronic inflammatory and iron deficient  - iron supplements as tolerated.     **CKD  - baseline Cr 1.2    **HTN  - stable on home meds    Daily Maintenance:  VTE chemoprophylaxis: not needed while on Eliquis  IV Fluids: none  Bowel Regimen: Senna and Miralax  Foley status: none  Nutrition: Reg diet      Disposition: floor, full code, refer to AIR after gets PT/OT eval        For questions regarding patients, please page [facility] Inpatient Pager: [phone].    ___________________________________________________________________      Neurological Exam  [name]  Ox3  PERRL  RUE 5 delt/bi/tri/we, 4 hg/io  LUE 2 delt 3 bi 4- tri 4 we/wf/hg/io  BLE 5/5    Right fingertips numb      The patient's vitals, intake/output, labs, orders, and relevant imaging were reviewed for the last 24 hours.    Patient Active Problem List   Diagnosis   ??? Prostate cancer metastatic to bone (CMS-HCC)   ??? Displacement of lumbar intervertebral disc without myelopathy   ??? Metastatic adenocarcinoma (CMS-HCC)   ??? DVT (deep venous thrombosis) (CMS-HCC)   ??? Anemia   ??? Cervical myelopathy with cervical radiculopathy (CMS-HCC)   ??? HTN (hypertension)   ??? CKD (chronic kidney disease)

## 2021-05-03 NOTE — Unmapped (Signed)
Physical Medicine and Rehab  Consult Note    Requesting Attending Physician: Illene Bolus Dipakkumar Upadh*  Service Requesting Consult: Neurosurgery [facility])    ASSESSMENT / RECOMMENDATIONS:     Kevin Cohen is a 69 y.o. male  with a past medical history of metastatic prostate cancer and known bony metastases s/p chemo+RT admitted for cervical myelopathy with a neurologic level of C5 in the setting of incomplete SCI.    The patient is seen in consultation for evaluation of rehabilitation needs.    Functional Impairment  LUE weakness  Suspected neurogenic bowel + bladder  Deconditioning/cervical myelopathy  - Please continue to have patient work with PT and OT to maximize functional status with mobility and ADLs.   - Work on ROM of all joints to prevent contracture  - Work on appropriate positioning of LUE using wrist/hand splint to prevent contracture  - Use resting dorsiflexion splints  - Encourage OOB To chair with goal of 6 hours per day  - Use abdominal binder/thigh high compression socks for BP support if needed as at risk of orthostatic hypotension  - For neurogenic bladder, please check PVRs after spontaneous void. If >150 ml, please straight cath. Goal volumes should be <500 ml with each void  - For neurogenic bowel, please work on daily bowel program. Current regimen includes Colace 100 mg BID, senna 2 tabs daily. Change Miralax to PRN daily.   - For pain, can increase gabapentin to 300 mg TID    Functional Goals  Improve mobility to baseline  Improve ADLs to baseline  Increase LUE strength    Rehabilitation Plan of Care  - Patient has complex rehab, nursing, and medical needs and is appropriate for Acute Inpatient Rehabilitation.  In AIR the patient would be expected to tolerate minimum of 3 hrs of therapy daily, 5x/week.  - Due to the shared treatment spaces inherent to inpatient rehabilitation, [facility] is now requiring a negative COVID-19 test prior to admission.  The patient has not yet undergone COVID-19 testing.  Please order a COVID-19 test at this time to rule out asymptomatic infection.  The patient cannot be admitted to [facility] without a resulted test.    *This PM&R consult does not guarantee that patient has been accepted to [facility], but can be helpful to guide patient progression.*  In order for the patient to be considered for admission to [facility] inpatient rehab, the case manager must give the patient / family choice in post-acute discharge options AND if the patient desires [facility], place a referral order to [facility] inpatient rehab. A referral for [facility] inpatient rehab has been received. The case manager may contact the Rehab Intake Coordinator with questions about acceptance to [facility],  bed availability and insurance authorization.     Corrin Parker, MD    Thank you for this consult.  Please contact the PM&R consult pager [phone]) for questions regarding these recommendations.  For questions of bed availability at [facility], contact the Intake Office at [phone].      SUBJECTIVE:     Reason for Consult: Patient seen in consultation at the request of [name] for evaluation of rehabilitation needs and recommendations.  Chief Complaint: Deconditioning  History of Present Illness: Kevin Cohen is a 70 y.o. male with a past medical history of metastatic prostate cancer and known bony metastases s/p chemo+RT admitted for cervical myelopathy.    Per review of EMR, he presented to neurosurgery with symptoms concerning for cervical myelopathy  and LUE weakness and underwent a C3-6 PSIF on 10/6. At baseline he uses a WC for mobility but PTA denied any issues with bladder. He did have an aggressive bowel regimen. Since admission he has met with rad onc and med onc and recommended for ongoing treatment.    In additional history, he states that he lives at home with his daughter who works from home. He is mod-I for ADLs at home and uses a RW or a WC for household mobility. He reports ongoing aching pain in his LUE that has been present for a few weeks; he also reports hypersensitivity in the LUE. He states that he has had limited mobility, proximal > distal, of the LUE for at least 3 weeks and has been relying on his RUE for most activity. He does have numbness/tingling in the RUE in digits 1-3. He also has BLE weakness, resulting in the use of mobility aids, which has been present since May/June 2022. He denies any bladder issues such as leakage or reduced sensation but admits he often has a high volume. He has had some bowel urgency and altered sensation.    Interim History: Since he was last seen, he has been working with OT and PT. Today he reports ongoing pain - 7/10 - mostly in the LUE that he has a difficult time describing. He does have hypersensitivity in this area as well. He reports he is continent of urine and is no longer constipated but refused his bowel meds this morning.     Prior Functional Status:  Required some assistance for mobility and/or activities of daily living.    Prior Functional Status: pt reports indep rw and indep ambulation room distances at daughters home with rolling walker at recent baseline    Current Functional Status:   Reviewed    Activities of Daily Living:   Assessment  Clinical Decision Making: Moderate  Assessment: pt is a 70 y.o. male who presents cervical myelopathy and worsening LUE weakness. Patient has prostate cancer, diagnosed in 2019, with nodal involement and bony metastases to the cervical and thoracic spine, including known T5-6 met since May 2022. pt presents to OT on this date with impaired BUE strength, that is more extensive in LUE, impacting his ability to complete functional tasks. pt also presents with impaired functional mobility and pain, impacting his ability to complete his ADL routine I/mod I. pt would continue to benefit from skilled acute OT in order to improve safety and independence in ADLs/iADLs, functional mobiltiy, and overall activity tolerance. pt post acute recs at this time are for 5x weekly with high intensity.  Today's Interventions: pt completed bed mobility, LBS, functional mobility, and BUE ROM testing. pt educated on OT role and POC as well as importance of incorporating use of LUE into functional tasks              Mobility:   Bed Mobility: min assist with all        Gait: pt refusing OOB mobility assessment due to LUE pain, recently returned from radiation, and recently ordered lunch  PT Post Acute Discharge Recommendations: 5x weekly, High intensity    Cognition, Swallow, Speech:                 Assistive Devices: Rolling Walker, [employer]    Precautions:  Safety Interventions  Safety Interventions: low bed, bed alarm  Bleeding Precautions: blood pressure closely monitored    Medical / Surgical History:   No past medical history on  file.  No past surgical history on file.     Cancer History:  Oncology History Overview Note   - 06/19: Found to have PSA 72 with PCP. Referred to urology.  - 01/01/18: Received degarelix loading dose with urology  - 02/28/18: Prostate bipsy with pathology showed prostate adenocarcinoma with androgen deprivation treatment effect. Adenocarcinoma is present in all core fragments with involvement of 50% or more of each core. Perineural invasion is present. Gleason grade not reliably assessed due to ADT effect.  - 03/11/18: Staging PET: Enlarged left external iliac node measuring 1.4 cm, [name], [name], faint sclerosis in the   right anterior sixth rib, neither with significant metabolic activity.  - 03/2018: Underwent definitive XRT, finished 07/2018.  - 04/18/18: PSA 3.8  - 08/22/18: PSA 0.83  - 08/20: Declined additional ADT due to vasomotor symptoms  - 04/14/19: PSA 24  - 04/28/19: CT CAP shows enlarging sclerotic focus along the posteroinferior T6 vertebral body, [name], [name], T6 and T1 vertebral body correspond to sclerotic lesions on comparison CT and consistent with skeletal metastasis.   - 05/13/19: PSA 33  - 05/15/19: Started docetaxel  - 06/04/19: PSA 53  - 06/26/19: PSA 14  - 08/17/19: PSA 7  - 09/07/19: PSA 5.5  - 10/05/19: PSA 6.0. Completed 6 cycles of docetaxel.  - 11/03/19: PSA 4.3  - 01/05/20: PSA 5.5  - 01/06/20: Found on renal US to have posterior bladder wall mass, declined urology referral or additional workup.  - 04/06/20: PSA 11.9  - 09/21: Stefani Dama but stopped after 2-3 weeks 2/2 side effects.  - 05/06/20: PSA 14  - 10/21: Declined further treatment and elected hospice.  - 5/22-31/22: Admitted for T6-T7 cord compression. Started steroids and treated with XRT, finished 12/26/20. Discharged to SNF.  - 12/13/20: PSA 220  - 01/24/21: See by Dr. Cathie Hoops with [facility] oncology. Noted lower body numbness and BLE weakness. Recommended restarting ADT, possible oral chemotherapy. Declined treatment.  - 03/31/21: Seen by Dr. Sandie Ano with [facility]. Ordered MRI spine and CT CAP, [name], [name], [name], indeterminate 0.4 cm left lower lobe groundglass nodule.  - 04/05/21: MRI C/T/L Spine with possible discitis/osteomyelitis C5-T1 with intraosseous abscess and surrounding epidural and paravertebral soft tissue thickening, diffuse epidural and osseous mets, and additional spondylosis of several segments with associated moderate spinal canal narrowing (C3-C5, C7-T1, T3-T4) and severe narrowing C5-C7.     Prostate cancer metastatic to bone (CMS-HCC)   04/05/2021 Initial Diagnosis    Malignant neoplasm of prostate metastatic to bone (CMS-HCC)     04/07/2021 -  Cancer Staged    Staging form: Prostate, AJCC 8th Edition  - Clinical: Stage IVB (cTX, cN1, pM1b) - Signed by Maurie Boettcher, MD on 04/07/2021       04/10/2021 - 04/10/2021 Endocrine/Hormone Therapy    OP DEGARELIX  Plan Provider: Maurie Boettcher, MD     04/27/2021 -  Radiation    Radiation Therapy Treatment Details (Noted on 04/27/2021)  Site: Spine -  Cervical  Technique: 3D CRT  Goal: No goal specified  Planned Treatment Start Date: No planned start date specified     05/11/2021 Endocrine/Hormone Therapy    OP LEUPROLIDE (LUPRON) 22.5 MG EVERY 3 MONTHS  Plan Provider: Maurie Boettcher, MD           Social History:        Living Environment: House  Lives With: Daughter  Home Living: One level home, Stairs to enter with rails, Standard height toilet, Tub/shower unit (pt showers on toilet with wash cloth)  Rail placement (outside): Bilateral rails  Number of Stairs to Enter (outside): 2    Family History: Reviewed and non-contributory to rehab needs  family history is not on file.    Allergies:   Penicillins    Medications:   Scheduled   ??? Abiraterone (ZYTIGA) tablet 250 mg **Patient Supplied** Daily   ??? amLODIPine (NORVASC) tablet 5 mg Daily   ??? apixaban (ELIQUIS) tablet 5 mg BID   ??? atorvastatin (LIPITOR) tablet 40 mg Daily   ??? docusate sodium (COLACE) capsule 100 mg BID   ??? ferrous sulfate tablet 325 mg Every other day   ??? gabapentin (NEURONTIN) capsule 300 mg BID   ??? MORPhine (MS CONTIN) 12 hr tablet 15 mg Q12H   ??? polyethylene glycol (MIRALAX) packet 17 g Daily   ??? predniSONE (DELTASONE) tablet 40 mg Daily   ??? senna (SENOKOT) tablet 2 tablet Daily before lunch     PRN acetaminophen, 650 mg, Q6H PRN  acetaminophen, 650 mg, Q6H PRN  albuterol, 2 puff, Q4H PRN  bisacodyL, 10 mg, Daily PRN  HYDROmorphone, 2 mg, Q6H PRN  MORPhine injection, 2 mg, Q2H PRN  ondansetron, 4 mg, Q4H PRN  SMOG, 240 mL, Daily PRN      Continuous Infusions Review of Systems:    General ROS:   Full 10 systems reviewed and neg, unless noted in HPI  OBJECTIVE:     Vitals:  Temp:  [35.9 ??C (96.6 ??F)-36.3 ??C (97.3 ??F)] 36.3 ??C (97.3 ??F)  Heart Rate:  [60-61] 61  Resp:  [16] 16  BP: (110-123)/(68-73) 114/68  MAP (mmHg):  [83-87] 83  SpO2:  [95 %-100 %] 96 %    Physical Exam:    GEN: Lying in bed in NAD.    HEENT: Atraumatic. Normocephalic. Moist mucous membranes.  RESP: NWOB on RA.  CV:  no edema  GI: abd soft, NTND   GU: No Foley   SKIN: no rashes or ecchymoses on exposed skin    NEURO:  Mental Status: A&Ox3, attention full, speech fluid and coherent, follows commands without R/L confusion  Sensory: BUE and BLE sensation reduced/altered, starting at C6  Motor:     RUE/LUE: shoulder abd 5/0, biceps 5/2, triceps 5/2, wrist extension 4/3, hand grasp 4/4    RLE/LLE: hip flexion 4/4, knee extension 4/4, DF 5/5, 1st toe extension 5/5 PF 4/4    PSYCH: mood euthymic, affect appropriate, thought process logical     Labs and Diagnostic Studies: Personally reviewed   CBC -   Results in Past 2 Days  Result Component Current Result   WBC 5.8 (05/03/2021)   RBC 2.66 (L) (05/03/2021)   HGB 8.2 (L) (05/03/2021)   HCT 24.6 (L) (05/03/2021)   MCV 92.4 (05/03/2021)   MCH 30.8 (05/03/2021)   MCHC 33.3 (05/03/2021)   MPV 8.4 (05/03/2021)   Platelet 141 (L) (05/03/2021)     BMP -   Results in Past 2 Days  Result Component Current Result   Sodium 135 (05/03/2021)   Potassium 4.3 (05/03/2021)   Chloride 103 (05/03/2021)   CO2 24.0 (05/03/2021)   BUN 33 (H) (05/03/2021)   Creatinine 1.07 (05/03/2021)   EST.GFR (MDRD) Not in Time Range   Glucose 114 (05/03/2021)     Coagulation -   No results found for requested labs within last 2 days.     Cardiac markers -   No results found for requested labs within last 2 days.     LFT's -   No results found for requested labs within last 2 days.       Radiology Results: Reviewed MRI cervical/thoracic/lumbar spine from 04/05/21. Notable for diffuse metastases in bony spine; agree with report as copied/pasted:    IMPRESSION:  -Possible discitis osteomyelitis involving C5-T1 with 4.2 cm intraosseous abscess and surrounding epidural and paravertebral soft tissue thickening. Alternatively, extensive tumor / necrosis as above.  Please correlate with clinical findings.  ??  -Epidural soft tissue thickening and enhancement extends from the skull base to the visualized lumbar spine most consistent with diffuse epidural metastases. No epidural abscess.  ??  -Diffuse osseous metastatic disease throughout the vertebral bodies and posterior elements and involving the sacrum. This appears to have changed significantly compared to the prior thoracic and lumbar spine MRI dated 12/10/2020  ??  -Spondylosis of the cervical spine with moderate spinal canal narrowing from C3-C5, C7-T1 and severe spinal canal narrowing at C5-C7 exacerbated by the underlying process.  ??  -Spondylosis of the thoracic spine with moderate T3-T4 and mild T8-T9, T11-T12 spinal canal narrowing. Foraminal narrowing diffusely, worst at T5-6 and T6-7.  ??  -Limited evaluation of the mid and lower lumbar spine due to susceptibility artifact from posterior fusion hardware extending from L4 to S1. Mild spinal canal narrowing at L2-L3.          For coding purposes:   - This patient was seen by the provider Corrin Parker, MD).  - This encounter should be coded as a a subsequent hospital care (follow up consultation).

## 2021-05-03 NOTE — Unmapped (Addendum)
Facility Information: [facility]   Facility ID: [id number]  Facility Medicare Provider Number: [id number]           Physical Medicine and Rehabilitation  Rehab Pre-Admit Screening  Date: 05/05/2021   Time: 2:51 PM     Patient Information:    Patient Name:  Kevin Cohen Medical Record Number: [id number]   Address:  [address] [place] [address] Sex: Male   Date of Birth: 01-05-1951 Age: 70 y.o.   Room/Bed:  7116/7116-01    _____________________________________________________________________________    Attending Physician's Review and Admission Determination   Medical Necessity:  The patient requires acute inpatient rehabilitation to maximize functional independence and requires daily physician visits for monitoring/management of metastatic prostate cancer, cervical myelopathy with SCI, vital signs, anticoagulation, neurological exam, medications, neurogenic bowel and bladder, skin, spasticity and pain control. This patient's rehabilitation goals and medical complexity could not adequately be managed in a less intensive setting.  Potential risks for clinical complications include falls, adverse medication reactions, DVT/PE, pressure ulcers, wound dehiscence/infection, aspiration, urinary tract infection, dehydration/malnutrition, respiratory failure, worsening of underlying disease and bleeding.    Medical Prognosis: Good for continued progress and participation with therapy.    Anticipated Interdisciplinary Rehabilitation Interventions: Activity tolerance: Patient is expected to tolerate minimum of 3 hrs of therapy daily @ 5x/week. Physical therapy to work on mobility. Occupational therapy to work on self care. Recreational therapy for community reintegration and relaxation training. Rehab Nursing to work on medication administration, patient/family education, skin care, wound care, fall prevention, bowel and bladder management, vital signs and feeding/nutrition. Weekly interdisciplinary team conference to assess progress and plan of care changes.    Expected Functional Outcomes:  Expected level of improvement/goals for inpatient rehabilitation include independent for wheelchair mobility, supervision or some assistance for transfers, supervision or some assistance for ambulation, supervision, setup or some assistance for self care (ADLs) and supervision for safety    Rehab Impairment Group Code [facility]): (Spinal Cord Dysfunction - Non-Traumatic) [phone] Quadriplegia, Incomplete C5-8  Etiologic Diagnosis: Kevin Cohen is a 70 y.o. male  with a past medical history of metastatic prostate cancer and known bony metastases s/p chemo+RT admitted for cervical myelopathy with a neurologic level of C5 in the setting of incomplete SCI admitted to [facility] on 04/25/2021. Kevin Cohen has participated in acute inpatient physical and occupational therapies to improve functional mobility, activity tolerance, functional strength, balance, and endurance in order to facilitate safe performance of ADLs and daily routines.  Kevin Cohen has been referred to [facility] for continued acute medical management, provision of intensive inpatient therapies, and patient/family training to facilitate safe performance of ADLs and mobility prior to discharge home.    Darrick Grinder, MD 05/06/2021 7:46 AM          _____________________________________________________________________________    Advanced Directives:  Does patient have an advance directive covering medical treatment?: Patient does not have advance directive covering medical treatment.       Reason patient does not have an advance directive covering medical treatment:: Patient does not wish to complete one at this time.      Does patient have an advance directive covering mental health treatment?: Patient does not have advance directive covering mental health treatment.       Reason patient does not have an advance directive covering mental health treatment:: Patient does not wish to complete one at this time.      Coverage Information:  Authorization number:    Authorization  Code:  [id number]  Expires: 06/26/2021 11:25 AM  Payor: Francine Graven MEDICARE ADV / Plan: [employer] GOLD PLUS HMO / Product Type: *No Product type* /     Physician/Referral Information:  Referring Facility: [facility]       [name]  Referring Case Manager: Lauralyn Primes           Prior Living Situation:  Living Environment: House    Lives With: Daughter    Home Living: One level home; Stairs to enter with rails; Standard height toilet; Tub/shower unit    Rail placement (outside): Bilateral rails              Prior Level of Function:  Prior Function Comments: pt reports that he is I for his ADLs, but completes bathing while seated on toilet with wash cloths. pt states that he utilizes RW for mobility and spends most of the day on the porch playing with his puppy and watching westerns. pt states that he does not drive and is retired. pt states that he occasionally goes out shopping with daughter, but that he spends most of the day at home. pt endorses a fall ~ 4 months ago stating that he was walking on an uneven surface with RW and did not notice the change in elevation, he states that he did not require any medical attention. pt reports indep rw and indep ambulation room distances at daughters home with rolling walker at recent baseline                           Rehabilitation Diagnosis:  Etiologic Diagnosis / Description: Kevin Cohen is a 70 y.o. male  with a past medical history of metastatic prostate cancer and known bony metastases s/p chemo+RT admitted for cervical myelopathy with a neurologic level of C5 in the setting of incomplete SCI admitted to [facility] on 04/25/2021. Kevin Cohen has participated in acute inpatient physical and occupational therapies to improve functional mobility, activity tolerance, functional strength, balance, and endurance in order to facilitate safe performance of ADLs and daily routines.  Kevin Cohen has been referred to [facility] for continued acute medical management, provision of intensive inpatient therapies, and patient/family training to facilitate safe performance of ADLs and mobility prior to discharge home.      Impairment Group: (Spinal Cord Dysfunction - Non-Traumatic) 16.1096 Quadriplegia, Incomplete C5-8      Date of Onset: 04/25/2021              Patient Active Problem List    Diagnosis Date Noted   ??? HTN (hypertension) 04/27/2021   ??? CKD (chronic kidney disease) 04/27/2021   ??? DVT (deep venous thrombosis) (CMS-HCC) 04/26/2021   ??? Anemia 04/26/2021   ??? Cervical myelopathy with cervical radiculopathy (CMS-HCC) 04/26/2021   ??? Displacement of lumbar intervertebral disc without myelopathy 04/25/2021   ??? Metastatic adenocarcinoma (CMS-HCC) 04/25/2021   ??? Prostate cancer metastatic to bone (CMS-HCC) 04/05/2021       Medical / Functional Conditions Requiring Inpatient Rehab: Patient requires monitoring of labwork, electrolytes, blood pressure and monitoring/management of medical diagnosis. Medical management/administration of chemo agents, antilipemics, steroids, anticoagulants, antihypertensives, opioid analgesics and other prescribed/necessary medications.  Monitoring/management of pain, cardiopulmonary status, renal status, gastrointestinal status, neurological status, infection, bleeding, bowel and bladder, DVT, and skin breakdown.      Risk for Medical / Clinical Complication: Patient at increased risk for complications of cardiopulmonary insufficiency, renal insufficiency, hypertensive crisis, gastrointestinal insufficiency, neurological decline,  seizures, fluid volume overload, aspiration, bleeding, infection, uncontrolled pain, bowel/bladder retention, falls, and skin breakdown.      Special Rehabilitation Needs:  IV Lines / Tubes / Drains: PIV                   Cultural Requests During Hospitalization: Pt at increased risk of anxiety/depression related to recent medical status change    Safety Equipment at Bedside: Suction    Nutrition Type: General          Weight Bearing Status: Non-applicable    Requires modified schedule: Pt may require rest breaks between therapy sessions      Precautions: Falls precautions       Required Braces or Orthoses: Non-applicable      Lines/Drains/Airways:                   Past Medical History:  No past medical history on file.  Past Surgical History:  No past surgical history on file.  Family History:  History reviewed. No pertinent family history.  Social History:  Social History     Socioeconomic History   ??? Marital status: Single     Spouse name: None   ??? Number of children: None   ??? Years of education: None   ??? Highest education level: None     Social Determinants of Health     Financial Resource Strain: Low Risk    ??? Difficulty of Paying Living Expenses: Not hard at all   Food Insecurity: No Food Insecurity   ??? Worried About Programme researcher, broadcasting/film/video in the Last Year: Never true   ??? Ran Out of Food in the Last Year: Never true   Transportation Needs: No Transportation Needs   ??? Lack of Transportation (Medical): No   ??? Lack of Transportation (Non-Medical): No     Current Facility-Administered Medications Ordered in Epic   Medication Dose Route Frequency Provider Last Rate Last Admin   ??? Abiraterone (ZYTIGA) tablet 250 mg **Patient Supplied**  1,000 mg Oral Daily Richrd Humbles, MD   1,000 mg at 05/05/21 0940   ??? acetaminophen (TYLENOL) tablet 650 mg  650 mg Oral Q6H PRN Richrd Humbles, MD   650 mg at 05/05/21 0813   ??? acetaminophen (TYLENOL) tablet 650 mg  650 mg Oral Q6H PRN Richrd Humbles, MD       ??? albuterol (PROVENTIL HFA;VENTOLIN HFA) 90 mcg/actuation inhaler 2 puff  2 puff Inhalation Q4H PRN Richrd Humbles, MD       ??? amLODIPine (NORVASC) tablet 5 mg  5 mg Oral Daily Richrd Humbles, MD   5 mg at 05/04/21 0454   ??? apixaban (ELIQUIS) tablet 5 mg  5 mg Oral BID Rickey Barbara, FNP   5 mg at 05/05/21 0813   ??? atorvastatin (LIPITOR) tablet 40 mg  40 mg Oral Daily Richrd Humbles, MD   40 mg at 05/05/21 0981   ??? bisacodyL (DULCOLAX) suppository 10 mg  10 mg Rectal Daily PRN Richrd Humbles, MD       ??? DULoxetine (CYMBALTA) DR capsule 30 mg  30 mg Oral daily Beverlyn Roux, MD   30 mg at 05/05/21 0547    Followed by   ??? [START ON 05/10/2021] DULoxetine (CYMBALTA) DR capsule 60 mg  60 mg Oral daily Jacquelyn A MacDonell, MD       ??? ferrous sulfate tablet 325 mg  325 mg Oral Every other day Richrd Humbles, MD   325 mg at 05/04/21  4098   ??? gabapentin (NEURONTIN) capsule 300 mg  300 mg Oral Daily Lindie Spruce, MD   300 mg at 05/05/21 1202   ??? lidocaine (LIDODERM) 5 % patch 2 patch  2 patch Transdermal Daily Lindie Spruce, MD   2 patch at 05/05/21 0815   ??? MORPhine (MS CONTIN) 12 hr tablet 15 mg  15 mg Oral Q12H Richrd Humbles, MD   15 mg at 05/05/21 0939   ??? ondansetron (ZOFRAN) injection 4 mg  4 mg Intravenous Q4H PRN Richrd Humbles, MD       ??? oxyCODONE (ROXICODONE) immediate release tablet 10 mg  10 mg Oral Q4H PRN Beverlyn Roux, MD   10 mg at 05/05/21 0939   ??? oxyCODONE (ROXICODONE) immediate release tablet 5 mg  5 mg Oral Q6H PRN Beverlyn Roux, MD       ??? polyethylene glycol (MIRALAX) packet 17 g  17 g Oral Daily Lindie Spruce, MD   17 g at 05/01/21 1131   ??? [START ON 05/06/2021] predniSONE (DELTASONE) tablet 30 mg  30 mg Oral Daily Rickey Barbara, FNP        Followed by   ??? [START ON 05/09/2021] predniSONE (DELTASONE) tablet 20 mg  20 mg Oral Daily Rickey Barbara, FNP        Followed by   ??? [START ON 05/12/2021] predniSONE (DELTASONE) tablet 10 mg  10 mg Oral Daily Rickey Barbara, FNP        Followed by   ??? [START ON 05/15/2021] predniSONE (DELTASONE) tablet 5 mg  5 mg Oral Daily Rickey Barbara, FNP       ??? senna (SENOKOT) tablet 2 tablet  2 tablet Oral Daily before lunch Lindie Spruce, MD   2 tablet at 05/01/21 1131   ??? SMOG ENEMA  240 mL Rectal Daily PRN Richrd Humbles, MD         No current Epic-ordered outpatient medications on file.     Medications Prior to Admission   Medication Sig Dispense Refill Last Dose   ??? abiraterone (ZYTIGA) 250 mg Tab tablet Take 4 tablets (1,000 mg total) by mouth daily. 120 tablet 11    ??? amLODIPine (NORVASC) 5 MG tablet TAKE 1 TABLET BY MOUTH ONCE DAILY FOR BLOOD PRESSURE      ??? atorvastatin (LIPITOR) 40 MG tablet Take 40 mg by mouth daily.      ??? benzonatate (TESSALON) 100 MG capsule TAKE 1 CAPSULE BY MOUTH THREE TIMES DAILY AS NEEDED FOR COUGH      ??? FEROSUL 325 mg (65 mg iron) tablet TAKE 1 TABLET BY MOUTH EVERY OTHER DAY FOR ANEMIA      ??? gabapentin (NEURONTIN) 300 MG capsule TAKE 1 CAPSULE BY MOUTH TWICE DAILY FOR PAIN      ??? HYDROmorphone (DILAUDID) 2 MG tablet TAKE 1 TABLET BY MOUTH EVERY 6 HOURS AS NEEDED FOR SEVERE PAIN FOR UP TO 12 DOSES      ??? MORPhine (MS CONTIN) 15 MG 12 hr tablet Take 1 tablet (15 mg total) by mouth every twelve (12) hours. 60 tablet 0    ??? ondansetron (ZOFRAN) 4 MG tablet TAKE 1 TABLET BY MOUTH EVERY 8 HOURS AS NEEDED FOR NAUSEA      ??? oxyCODONE (ROXICODONE) 5 MG immediate release tablet Take 1-2 tablets (5-10 mg total) by mouth every six (6) hours as needed for pain. 90 tablet 0    ??? oxyCODONE-acetaminophen (PERCOCET) 5-325 mg per tablet  TAKE 1 TO 2 TABLETS BY MOUTH EVERY 8 HOURS AS NEEDED FOR PAIN      ??? predniSONE (DELTASONE) 5 MG tablet Take 1 tablet (5 mg total) by mouth daily. 30 tablet 11           Vitals:    05/04/21 1517 05/04/21 1923 05/05/21 0344 05/05/21 0800   BP: 120/72 121/81 112/73 99/60   Pulse: 73 63 60 60   Resp: 18 18 18 16    Temp: 36.1 ??C (97 ??F) 35.8 ??C (96.4 ??F) 36 ??C (96.8 ??F) 36.2 ??C (97.2 ??F)   TempSrc: Temporal Temporal Temporal    SpO2: 100% 98% 97% 97%     Was the influenza vaccine received in this facility during this year's vaccination season?  If yes date:  Reported as having but no date listed  If no comment:     Vitals:     Height:??182.9 cm (6')  Last Wt:??81.6 kg (180 lb)      Labs:      CBC -   Lab Results   Component Value Date    WBC 5.0 05/05/2021    RBC 2.67 (L) 05/05/2021    HGB 8.2 (L) 05/05/2021    HCT 24.8 (L) 05/05/2021    MCV 92.9 05/05/2021    MCH 30.9 05/05/2021    MCHC 33.2 05/05/2021    MPV 7.9 05/05/2021    PLT 133 (L) 05/05/2021     BMP -   Lab Results   Component Value Date    NA 132 (L) 05/05/2021    K 4.2 05/05/2021    CL 101 05/05/2021    CO2 25.0 05/05/2021    BUN 33 (H) 05/05/2021    CREATININE 1.06 05/05/2021    GLU 100 05/05/2021     CARD - No results found for: CKTOTAL, CKMB, TROPONINI, TROPONINT, MYOGLOBIN  Coagulation -   Lab Results   Component Value Date    PT 12.3 04/27/2021    INR 1.08 04/27/2021    APTT 25.8 04/27/2021     ABGs- No results found for: PHART, PO2ART, PCO2ART, BEART, HCO3ART, O2SATART  LFT's -   Lab Results   Component Value Date    ALBUMIN 2.9 (L) 05/05/2021    ALT <7 (L) 05/05/2021    AST 16 05/05/2021    ALKPHOS 732 (H) 05/05/2021    BILITOT 0.4 05/05/2021    PROT 5.4 (L) 05/05/2021       Current Functional Status:        Feeding - Needs Assistance: Set Up Assist    Grooming - Needs Assistance: Min assist    Bathing - Needs Assistance: Min assist    Toileting - Needs Assistance: Min assist    UB Dressing - Needs Assistance: Min assist    LB Dressing - Needs Assistance: Min assist; Performed seated    IADLs: NT      Mobility:   Bed Mobility: min assist with all    Transfers: Min A    Gait: amb ~31ft without device with mod assist x 1. flexed posture, difficulty unweighting LE to advance    Stairs: NA    Wheelchair Mobility: NA      Cognition/Swallow/Speech:AOx4, clear speech, following commands, regular diet                   DME Recommendations:  PT DME Recommendations: Defer to post acute    OT DME Recommendations: Defer to post acute      Willingness to  Participate: Patient able and willing to participate in 3 hours of therapy    Rehab Goals and Plan    Expected level of improvement for safe discharge: Patient will discharge home as independent as possible with ADLs and functional mobility with least restrictive assistive device for household distances.     Patient / Family Goals: Patient will safely return home with family, modified independence in ADLs and assist as needed with ambulation for household distances.    Required treatments and services: Rehab nursing, Case management, Dietician/nutrition     Anticipated Interventions:    Physical Therapy: 60-120 min/day 5-7 days/wk  Occupational Therapy: 60-120 min/day 5-7 days/wk  Speech Therapy: 30-60 min/day 3-5 days/wk  Recreational therapy: 30 min/day 3 days/wk  Prosthetics and Orthotics: As Needed      Anticipated services upon discharge:     Outpatient therapy: PT, OT    Expected discharge destination: Home with daughter    Discharge support: Patient has a caregiver available    Patient/family/caregiver orientation: Patient and family agreeable to inpatient rehab plan    Estimated Length of Stay: 14-21 days     Projected Admission Date: Saturday  May 06 2021    Reviewer's Signature, Date and Time: Myriam Forehand [facility]  [facility]  Inpatient Coordinator  [phone]    11:05 AM 05/05/2021

## 2021-05-03 NOTE — Unmapped (Signed)
Physical Medicine and Rehab  Consult Note    Requesting Attending Physician: Illene Bolus Dipakkumar Upadh*  Service Requesting Consult: Neurosurgery [facility])    ASSESSMENT / RECOMMENDATIONS:     Kevin Cohen is a 70 y.o. male with past medical history of metastatic prostate cancer and known bony metastases s/p chemo+RT admitted on 04/25/2021 for cervical myelopathy with a neurologic level of C5 in the setting of incomplete SCI.    Duplicate consult order received for patient. Consult completed by Dr. Sudie Bailey on 10/6; please see recommendations.Thank you for this consult. Please contact the PM&R consult pager [phone]) for questions regarding these recommendations.  For questions of bed availability at [facility], contact the Intake Office at [phone].    Devonda Pequignot Rogelia Rohrer, AGNP   I did not see patient; I did chart review and sent chat message to Dr. Cipriano Bunker

## 2021-05-03 NOTE — Unmapped (Signed)
Palliative Care Consult Note    Consultation from Requesting Attending Physician:  Illene Bolus Dipakkumar Upadh*  Service Requesting Consult:  Neurosurgery [facility])  Reason for Consult Request from Attending Physician:  Evaluation of Symptoms  Primary Care Provider:  Abram Sander, MD      Assessment/Plan:      SUMMARY:  This 70 y.o. patient is seriously ill due to metastatic prostate cancer with widespread bone metastasis throughout spine admitted for worsening pain and upper extremity weakness, complicated by co-morbid acute and chronic conditions including h/o spinal cord compression.    Symptom Assessment and Recommendations:      # Left shoulder and arm pain due to cancer: combination of nociceptive and neuropathic pain. Some improvement during this hospitalization but continues to report relatively high levels of persistent pain but using prns infrequently.   - Continue MS Contin 15 mg PO BID  - Start oxycodone 10 mg PO every 4 hours prn pain  - Start duloxetine 30 mg PO daily x 1 week and then increase to 60 mg PO daily  - Decrease gabapentin to 300 gm PO nightly x 1 week and then stop  - Discontinue hydromorphone 2 mg PO every 6 hour prn  - Change IV morphine to 2 mg every 6 hours prn severe pain. Discussed with patient focusing on using PO oxycodone as first line and plan to stop IV morphine by tomorrow.  - Consider lidocaine patch to Left deltoid    # Prevention of opioid-induced constipation:   - Counseled on risk of constipation with opioids and likely need for scheduled stool softeners.   - Discontinue colace due to limited effectiveness and mainly increases pill burden  - Continue senna 2 tabs nightly and miralax daily; if stools too frequent or loose would change miralax to as needed.    Goals of Care and Decision Making:     Decisional capacity at time of visit:  Able to make decisions for himself    Healthcare Decision Maker if lacks capacity: Name:    HCDM (patient stated preference): Noodwood,Catherine - Daughter - [phone]    Advance Directive: no    Code status:   Code Status: Full Code     Prognosis / prognostic understanding:  Not discussed today.    Current Goals of care:  Kevin Cohen is focused on improving his strength if possible and trying to get better.      Counseling and Coordination - Practical, Emotional, Spiritual Support Needs:    Kevin Cohen is well supported by his family. Kevin Cohen likes to think positively and focus on getting better. Kevin Cohen denied any support needs at this time. Kevin Cohen has been referred to Outpatient Palliative Care by Dr. Philomena Course and is scheduled to see Dr. Wanita Chamberlain on 05/11/21.      Thank you for this consult. Please page  Darrin Nipper  or Palliative Care [phone]) if there are any questions.       Subjective:     HPI: Kevin Cohen is an 70 y.o. M with metastatic prostate cancer with diffuse bone mets throughout his spine with h/o spinal cord compression at T6-T7 in May 2022 treated non-surgically. Kevin Cohen was initially diagnosed with prostate cancer in June 2019 and underwent esternal beam radiation from 03/2018-07/2018 and androgen deprivation therapy until 02/2019 and then stopped due to side effects. In October 2020 found to have metastatic disease and had 6 cycles of docetaxel treatments. In October 2021 Kevin Cohen decided not to pursue additional cancer-directed therapy and enrolled in hospice  care. Kevin Cohen developed worsening lower extremity weakness and presented to the hospital in May 2022 and was found to have cord compression and had palliative radiation. Per review of outside records, Kevin Cohen followed up with his medical oncologist who recommended restarting ADT and consideration of oral chemotherapy. Kevin Cohen declined starting treatment. Kevin Cohen September Kevin Cohen was seen by [facility] neurosurgery and referred to [facility] oncology and saw Dr. Philomena Course with recommendation to obtain labs and start treatment which Kevin Cohen since has with [facility]. Since then Kevin Cohen has had increased weakness in his left arm. Kevin Cohen was admitted on 04/25/2021 with tentative plan to undergo surgery. After further evaluation it was determined that surgery would be anatomically difficult and likely not improve left arm weakness. Therefore decision was made to pursue radiation therapy and continue ADT and chemotherapy.     Kevin Cohen is feeling okay and feels like things are a little better overall. Kevin Cohen still has significant weakness in his left arm but has some independent movement of his left wrist. Kevin Cohen has a constant aching pain predominately in his left shoulder and upper arm. When pain is at it's strongest will also have sharp shooting pains. At best pain as been at a 7/10 and at worse is an 8-9/10. Has not had 10/10 pain since coming to the hospital. Would like his pain to be at 5-6/10. Shares that if Kevin Cohen pushes on his left deltoid that will help relieve the pain. Thinks the pain medication is helpful but cannot give much detail in terms of how long it helps or if any of the prns Kevin Cohen has taken is better than another one. Pain also in his R arm but not as severe and improves with movement. Kevin Cohen has some intermittent numbness and tingling in his R foot and left hand.     Symptom Severity and Assessment:     Pain severity and assessment:  Moderate to severe pain in Left shoulder  Shortness of breath:  denies  Nausea/Vomiting:  denies  Constipation:  No BM for several days when admitted and started on bowel regimen. Yesterday had 3 bowel movements.   Other:  Not much of an appetite but is able to eat regularly; will get tightness in abdomen when eats.       Allergies:  Allergies   Allergen Reactions   ??? Penicillins Hives and Other (See Comments)     Hives, welts, swelling profusely . Pretty severe reaction  Has patient had a PCN reaction causing immediate rash, facial/tongue/throat swelling, SOB or lightheadedness with hypotension: No  Has patient had a PCN reaction causing severe rash involving mucus membranes or skin necrosis: Yes  Has patient had a PCN reaction that required hospitalization: Yes  Has patient had a PCN reaction occurring within the last 10 years: No  If all of the above answers are NO, then may proceed with Cephalosporin use.       Medications:  Scheduled Meds:  ??? abiraterone  1,000 mg Oral Daily   ??? amLODIPine  5 mg Oral Daily   ??? apixaban  5 mg Oral BID   ??? atorvastatin  40 mg Oral Daily   ??? docusate sodium  100 mg Oral BID   ??? ferrous sulfate  325 mg Oral Every other day   ??? gabapentin  300 mg Oral BID   ??? MORPhine  15 mg Oral Q12H   ??? polyethylene glycol  17 g Oral Daily   ??? predniSONE  40 mg Oral Daily    Followed by   ??? [  START ON 05/06/2021] predniSONE  30 mg Oral Daily    Followed by   ??? [START ON 05/09/2021] predniSONE  20 mg Oral Daily    Followed by   ??? [START ON 05/12/2021] predniSONE  10 mg Oral Daily    Followed by   ??? [START ON 05/15/2021] predniSONE  5 mg Oral Daily   ??? senna  2 tablet Oral Daily before lunch     Continuous Infusions:  PRN Meds:.acetaminophen, acetaminophen, albuterol, bisacodyL, HYDROmorphone, MORPhine injection, ondansetron, SMOG     No past medical history on file.    No past surgical history on file.    Social History:  Lives in [place]. Currently has daughter living with him and helping him. Kevin Cohen has 4 children. In May Kevin Cohen was able to ride a motorcycle. Needs assistance with ADLs due to weakness.    Family History:      Relevant family history includes No history of prostate cancer or other cancers in his family. Reports Kevin Cohen has many family members with diabetes.    Review of Systems:  A 12 system review of systems was negative except as noted in HPI.      Objective:       Function:  50% - Ambulation: Mainly sit/lie / Unable to do any work, extensive disease / Self-Care:Considerable assistance required, Intake: Normal or reduced / Level of Conscious: Full or confusion    Temp:  [35.9 ??C (96.6 ??F)-36.3 ??C (97.3 ??F)] 36.3 ??C (97.3 ??F)  Heart Rate:  [60-61] 61  Resp:  [16] 16  BP: (110-123)/(68-73) 114/68  SpO2:  [95 %-100 %] 96 %    No intake/output data recorded.    Physical Exam:  Constitutional: Sitting in bed, appears comfortable, NAD   Eyes: anicteric sclera, no discharge  ENMT: moist oral mucosa, dental hygiene good   Pulm: normal work of breathing, no accessory muscle use   CV: no heave, no edema, RRR  Abd: mild distension, soft, nontender, no masses  MSK: no sarcopenia, some tenderness along Left deltoid, no muscle wasting  Skin: no rash on visible skin  Neuro: cognitive status alert and oriented x 3  muscle strength Unable to lift left arm on his own, can move his wrist independently  Psych: mood and affect congruent    Test Results:  Lab Results   Component Value Date    WBC 5.8 05/03/2021    RBC 2.66 (L) 05/03/2021    HGB 8.2 (L) 05/03/2021    HCT 24.6 (L) 05/03/2021    MCV 92.4 05/03/2021    MCH 30.8 05/03/2021    MCHC 33.3 05/03/2021    RDW 17.0 (H) 05/03/2021    PLT 141 (L) 05/03/2021    MPV 8.4 05/03/2021     Lab Results   Component Value Date    NA 135 05/03/2021    K 4.3 05/03/2021    CL 103 05/03/2021    CO2 24.0 05/03/2021    BUN 33 (H) 05/03/2021    CREATININE 1.07 05/03/2021    GLU 114 05/03/2021    CALCIUM 8.5 (L) 05/03/2021    ALBUMIN 3.3 (L) 04/26/2021    PHOS 2.9 05/03/2021      Lab Results   Component Value Date    ALKPHOS 899 (H) 04/26/2021    BILITOT 0.5 04/26/2021    PROT 6.2 04/26/2021    ALBUMIN 3.3 (L) 04/26/2021    ALT <7 (L) 04/26/2021    AST 16 04/26/2021  Imaging: reviewed in Epic    I personally spent 60 minutes on the floor or unit in direct patient care. The direct patient care time included face-to-face time with the patient, reviewing the patient's chart, communicating with the family and/or other professionals and coordinating care.   Start time - stop time if >60 minutes:    Greater than 50% of this time spent on counseling/coordination of care:  No.   See ACP Note from today for additional billable service:  No.

## 2021-05-03 NOTE — Unmapped (Signed)
Falls precautions maintained. Pt having persistent pain despite using PRNs discussing possible changes with primary team. Encouraged to participate in daily cares. VSS. Will continue to monitor.     Problem: Adult Inpatient Plan of Care  Goal: Plan of Care Review  Outcome: Progressing  Goal: Patient-Specific Goal (Individualized)  Outcome: Progressing  Goal: Absence of Hospital-Acquired Illness or Injury  Outcome: Progressing  Intervention: Identify and Manage Fall Risk  Recent Flowsheet Documentation  Taken 05/02/2021 2000 by Oretha Caprice, RN  Safety Interventions:   fall reduction program maintained   lighting adjusted for tasks/safety   low bed  Goal: Optimal Comfort and Wellbeing  Outcome: Progressing  Goal: Readiness for Transition of Care  Outcome: Progressing  Goal: Rounds/Family Conference  Outcome: Progressing     Problem: Fall Injury Risk  Goal: Absence of Fall and Fall-Related Injury  Outcome: Progressing  Intervention: Promote Injury-Free Environment  Recent Flowsheet Documentation  Taken 05/02/2021 2000 by Oretha Caprice, RN  Safety Interventions:   fall reduction program maintained   lighting adjusted for tasks/safety   low bed     Problem: VTE (Venous Thromboembolism)  Goal: VTE (Venous Thromboembolism) Symptom Resolution  Outcome: Progressing  Intervention: Prevent or Manage VTE (Venous Thromboembolism)  Recent Flowsheet Documentation  Taken 05/02/2021 2000 by Oretha Caprice, RN  Anti-Embolism Device Type: SCD, Knee  Anti-Embolism Intervention: Refused  Anti-Embolism Device Location: BLE     Problem: Self-Care Deficit  Goal: Improved Ability to Complete Activities of Daily Living  Outcome: Progressing     Problem: Adult Inpatient Plan of Care  Goal: Absence of Hospital-Acquired Illness or Injury  Outcome: Progressing  Intervention: Identify and Manage Fall Risk  Recent Flowsheet Documentation  Taken 05/02/2021 2000 by Oretha Caprice, RN  Safety Interventions:   fall reduction program maintained lighting adjusted for tasks/safety   low bed     Problem: Self-Care Deficit  Goal: Improved Ability to Complete Activities of Daily Living  Outcome: Progressing

## 2021-05-04 ENCOUNTER — Ambulatory Visit: Admit: 2021-05-04 | Discharge: 2021-05-05

## 2021-05-04 MED ADMIN — gabapentin (NEURONTIN) capsule 300 mg: 300 mg | ORAL | @ 16:00:00 | Stop: 2021-05-11

## 2021-05-04 MED ADMIN — apixaban (ELIQUIS) tablet 5 mg: 5 mg | ORAL | @ 13:00:00

## 2021-05-04 MED ADMIN — MORPhine (MS CONTIN) 12 hr tablet 15 mg: 15 mg | ORAL | @ 01:00:00 | Stop: 2021-05-09

## 2021-05-04 MED ADMIN — DULoxetine (CYMBALTA) DR capsule 30 mg: 30 mg | ORAL | @ 09:00:00 | Stop: 2021-05-10

## 2021-05-04 MED ADMIN — oxyCODONE (ROXICODONE) immediate release tablet 10 mg: 10 mg | ORAL | @ 02:00:00 | Stop: 2021-05-10

## 2021-05-04 MED ADMIN — apixaban (ELIQUIS) tablet 5 mg: 5 mg | ORAL | @ 01:00:00

## 2021-05-04 MED ADMIN — MORPhine injection 2 mg: 2 mg | INTRAVENOUS | @ 09:00:00 | Stop: 2021-05-04

## 2021-05-04 MED ADMIN — Abiraterone (ZYTIGA) tablet 250 mg **Patient Supplied**: 1000 mg | ORAL | @ 13:00:00

## 2021-05-04 MED ADMIN — amLODIPine (NORVASC) tablet 5 mg: 5 mg | ORAL | @ 13:00:00

## 2021-05-04 MED ADMIN — predniSONE (DELTASONE) tablet 40 mg: 40 mg | ORAL | @ 13:00:00 | Stop: 2021-05-06

## 2021-05-04 MED ADMIN — ferrous sulfate tablet 325 mg: 325 mg | ORAL | @ 13:00:00

## 2021-05-04 MED ADMIN — atorvastatin (LIPITOR) tablet 40 mg: 40 mg | ORAL | @ 13:00:00

## 2021-05-04 MED ADMIN — oxyCODONE (ROXICODONE) immediate release tablet 10 mg: 10 mg | ORAL | @ 13:00:00 | Stop: 2021-05-10

## 2021-05-04 MED ADMIN — lidocaine (LIDODERM) 5 % patch 2 patch: 2 | TRANSDERMAL | @ 13:00:00

## 2021-05-04 MED ADMIN — MORPhine (MS CONTIN) 12 hr tablet 15 mg: 15 mg | ORAL | @ 13:00:00 | Stop: 2021-05-09

## 2021-05-04 NOTE — Unmapped (Signed)
Palliative Care Progress Note    Consultation from Requesting Attending Physician:  [name]  Primary Care Provider:  Abram Sander, MD    Code Status:   Code Status: Full Code   Healthcare decision-maker if lacks capacity:   HCDM (patient stated preference): Noodwood,Catherine - Daughter - [phone]     Assessment/Plan:      SUMMARY:  This 70 y.o. patient is seriously ill due to metastatic prostate cancer with widespread bone metastasis throughout spine admitted for worsening pain and upper extremity weakness, complicated by co-morbid acute and chronic conditions including h/o spinal cord compression.    Symptom Assessment and Recommendations:      # Left shoulder and arm pain due to cancer: combination of nociceptive and neuropathic pain. Some improvement during this hospitalization but continues to report relatively high levels of persistent pain but using prns infrequently. No significant pain changes since adjusting regimen 10/12-10/13. Recommend continuing recommendations below for now with hopes of reducing baseline pain with PO regimen to help him be successful at AIR.  - Continue MS Contin 15 mg PO BID  - Continue oxycodone 10 mg PO every 4 hours prn pain  - Continue duloxetine 30 mg PO daily x 1 week and then increase to 60 mg PO daily. First dose 10/13.  - Continue gabapentin to 300 gm PO nightly x 1 week and then stop. First dose 10/13.  - Continue lidocaine patch to Left deltoid  ??  # Prevention of opioid-induced constipation:   - Counseled 10/12 on risk of constipation with opioids and likely need for scheduled stool softeners.   - Still recommend discontinuing colace due to limited effectiveness and mainly increases pill burden  - Continue senna 2 tabs nightly and miralax daily; if stools too frequent or loose would change miralax to as needed.    Goals of care / ACP:    Decisional capacity at time of visit:  Able to make decisions for himself.     Current goals of care:  Did not discuss explicitly today. However, the discussion on his pain and improvement needed for rehab implies that his is still focused on improving strength and recovery.        Practical, Emotional, Spiritual Support / Other Communication and Counseling:    Discussed further about his family support, including grandchildren and at least one great grandchild. He enjoyed talking about his family and some of what they do. Provided compassionate support and active listening. Of note, his faith in God was clearly important to him.      Thank you for this consult. Please page Darrin Nipper or Palliative Care [phone]) if there are any questions.     Subjective:     Recent Events:  NAEON. Mr. Zingg had his higher level of shoulder pain during the visit, but had taken his PRN oxy approximately 30 minutes before the visit. He said the pain was an 8/10 but tolerable. He also still complained about the tightness or band feeling around his abdomen. Overall, he was happy with the changes to the pain regimen discussed on 10/12 and wanted to continue before adding more. He is concerned about tolerance.       No updates to past / social / family history    Scheduled Meds:  ??? abiraterone  1,000 mg Oral Daily   ??? amLODIPine  5 mg Oral Daily   ??? apixaban  5 mg Oral BID   ??? atorvastatin  40 mg Oral Daily   ???  docusate sodium  100 mg Oral BID   ??? DULoxetine  30 mg Oral daily    Followed by   ??? [START ON 05/10/2021] DULoxetine  60 mg Oral daily   ??? ferrous sulfate  325 mg Oral Every other day   ??? gabapentin  300 mg Oral Daily   ??? lidocaine  2 patch Transdermal Daily   ??? MORPhine  15 mg Oral Q12H   ??? polyethylene glycol  17 g Oral Daily   ??? predniSONE  40 mg Oral Daily    Followed by   ??? [START ON 05/06/2021] predniSONE  30 mg Oral Daily    Followed by   ??? [START ON 05/09/2021] predniSONE  20 mg Oral Daily    Followed by   ??? [START ON 05/12/2021] predniSONE  10 mg Oral Daily    Followed by   ??? [START ON 05/15/2021] predniSONE  5 mg Oral Daily   ??? senna  2 tablet Oral Daily before lunch     Continuous Infusions:  PRN Meds:.acetaminophen, acetaminophen, albuterol, bisacodyL, ondansetron, oxyCODONE, oxyCODONE, SMOG     Objective:       Function:  50% - Ambulation: Mainly sit/lie / Unable to do any work, extensive disease / Self-Care:Considerable assistance required, Intake: Normal or reduced / Level of Conscious: Full or confusion    Temp:  [35.7 ??C (96.3 ??F)-36.7 ??C (98.1 ??F)] 35.7 ??C (96.3 ??F)  Heart Rate:  [59-69] 60  Resp:  [18] 18  BP: (113-129)/(63-79) 122/68  SpO2:  [97 %-100 %] 99 %    Physical Exam:  Constitutional: Sitting in bed, appears comfortable, NAD, pleasant  Eyes: anicteric sclera, no discharge, grossly normal vision  ENMT: moist oral mucosa, grossly normal hearing  Pulm: clear to auscultation anteriorly, normal work of breathing, no accessory muscle use  CV: RRR, no edema  Abd: mildly distended  MSK: no sarcopenia, some tenderness to palpation on inferior aspect of left deltoid  Skin: no obvious rashes  Neuro: A&Ox4, muscle strength unable to life L arm on his own but can move L wrist independently  Psych: mood and affect appropriate for situation and congruent    Test Results:  Lab Results   Component Value Date    WBC 5.8 05/03/2021    RBC 2.66 (L) 05/03/2021    HGB 8.2 (L) 05/03/2021    HCT 24.6 (L) 05/03/2021    MCV 92.4 05/03/2021    MCH 30.8 05/03/2021    MCHC 33.3 05/03/2021    RDW 17.0 (H) 05/03/2021    PLT 141 (L) 05/03/2021    MPV 8.4 05/03/2021     Lab Results   Component Value Date    NA 135 05/03/2021    K 4.3 05/03/2021    CL 103 05/03/2021    CO2 24.0 05/03/2021    BUN 33 (H) 05/03/2021    CREATININE 1.07 05/03/2021    GLU 114 05/03/2021    CALCIUM 8.5 (L) 05/03/2021    ALBUMIN 3.3 (L) 04/26/2021    PHOS 2.9 05/03/2021      Lab Results   Component Value Date    ALKPHOS 899 (H) 04/26/2021    BILITOT 0.5 04/26/2021    PROT 6.2 04/26/2021    ALBUMIN 3.3 (L) 04/26/2021    ALT <7 (L) 04/26/2021    AST 16 04/26/2021 I personally spent 40 minutes on the floor or unit in direct patient care. The direct patient care time included face-to-face time with the patient, reviewing the patient's chart,  communicating with the family and/or other professionals and coordinating care.   Start time - stop time if >60 minutes:    Greater than 50% of this time spent on counseling/coordination of care:  Yes.   See ACP Note from today for additional billable service:  No.    Ripley Fraise, MS4     I attest that I have reviewed the student note and that the components of the history of the present illness, the physical exam, and the assessment and plan documented were performed by me or were performed in my presence by the student where I verified the documentation and performed (or re-performed) the exam and medical decision making.     Darrin Nipper, MD

## 2021-05-04 NOTE — Unmapped (Signed)
Falls precautions maintained. Pain better managed with new PRN orders. Continues to have pain in left arm and tingling in left arm and right toes. Tolerating regular diet. VSS. Will continue to monitor.     Problem: Adult Inpatient Plan of Care  Goal: Plan of Care Review  Outcome: Progressing  Goal: Patient-Specific Goal (Individualized)  Outcome: Progressing  Goal: Absence of Hospital-Acquired Illness or Injury  Outcome: Progressing  Intervention: Identify and Manage Fall Risk  Recent Flowsheet Documentation  Taken 05/03/2021 2100 by Oretha Caprice, RN  Safety Interventions: fall reduction program maintained  Goal: Optimal Comfort and Wellbeing  Outcome: Progressing  Goal: Readiness for Transition of Care  Outcome: Progressing  Goal: Rounds/Family Conference  Outcome: Progressing     Problem: Fall Injury Risk  Goal: Absence of Fall and Fall-Related Injury  Outcome: Progressing  Intervention: Promote Injury-Free Environment  Recent Flowsheet Documentation  Taken 05/03/2021 2100 by Oretha Caprice, RN  Safety Interventions: fall reduction program maintained     Problem: VTE (Venous Thromboembolism)  Goal: VTE (Venous Thromboembolism) Symptom Resolution  Outcome: Progressing  Intervention: Prevent or Manage VTE (Venous Thromboembolism)  Recent Flowsheet Documentation  Taken 05/03/2021 2100 by Oretha Caprice, RN  Anti-Embolism Device Type: SCD, Knee  Anti-Embolism Intervention: Refused  Anti-Embolism Device Location: BLE     Problem: Self-Care Deficit  Goal: Improved Ability to Complete Activities of Daily Living  Outcome: Progressing     Problem: Adult Inpatient Plan of Care  Goal: Absence of Hospital-Acquired Illness or Injury  Outcome: Progressing  Intervention: Identify and Manage Fall Risk  Recent Flowsheet Documentation  Taken 05/03/2021 2100 by Oretha Caprice, RN  Safety Interventions: fall reduction program maintained     Problem: Self-Care Deficit  Goal: Improved Ability to Complete Activities of Daily Living  Outcome: Progressing     Problem: Skin Injury Risk Increased  Goal: Skin Health and Integrity  Outcome: Progressing

## 2021-05-04 NOTE — Unmapped (Signed)
NEUROSURGERY PROGRESS NOTE      Brief History of Present Illness  Kevin Cohen is a 70 y.o. male with PMH of HTN, HLD, and metastatic prostate cancer with known osseous mets (s/p multiple rounds of chemotherapy and radiation), who presents with several month history of symptoms concerning for cervical myelopathy and several week history of worsening LUE weakness. MRI total spine from 9/14 shows diffuse metastatic disease with epidural thickening and spinal canal stenosis from C3-5 without cord signal change, and left foraminal invasion at C5-6. He is admitted for a planned OR 10/6 but holding off on surgery at this point given anatomic challenges.     Subjective/Interval History  Stable on the floors overnight.     Interval Imaging Reviewed  none    Neurological Assessment and Plan  **Metastatic adenocarcinoma (CMS-HCC)  *Prostate Cancer with mets to cervical spine, recently established with [facility] and getting cancer treatment with hormone-directed therapy and radiation.  -Not a surgical candidate at this time  -Undergoing radiation with rad onc, started 10/6; plan to go till 10/19  -consulted PM&R and PT/OT for possible AIR admission to continue radiation while at rehab  - Prednisone taper 40mg  10/15; 30mg  10/15; 20mg  10/18; 10mg  10/21 and 5mg  10/24 (in [facility])  - scheduled morphine for pain    **Electrolyte status  Replete PRN    Non-neurologic problems being addressed    **DVT Left peroneal and politeal veins dx 04/26/21  -IVC filter placed 10/5 by [facility], needs outpt removal in 3-6 months  -on Eliquis 5mg  po bid    **Anemia  - chronic inflammatory and iron deficient  - iron supplements as tolerated.     **CKD  - baseline Cr 1.2    **HTN  - stable on home meds    Daily Maintenance:  VTE chemoprophylaxis: not needed while on Eliquis  IV Fluids: none  Bowel Regimen: Senna and Miralax  Foley status: none  Nutrition: Reg diet      Disposition: floor, full code, refer to AIR after gets PT/OT eval        For questions regarding patients, please page [facility] Inpatient Pager: [phone].    ___________________________________________________________________      Neurological Exam  [name]  Ox3  PERRL  RUE 5 delt/bi/tri/we, 4 hg/io  LUE 2 delt 3 bi 4- tri 4 we/wf/hg/io  BLE 5/5    Right fingertips numb      The patient's vitals, intake/output, labs, orders, and relevant imaging were reviewed for the last 24 hours.    Patient Active Problem List   Diagnosis   ??? Prostate cancer metastatic to bone (CMS-HCC)   ??? Displacement of lumbar intervertebral disc without myelopathy   ??? Metastatic adenocarcinoma (CMS-HCC)   ??? DVT (deep venous thrombosis) (CMS-HCC)   ??? Anemia   ??? Cervical myelopathy with cervical radiculopathy (CMS-HCC)   ??? HTN (hypertension)   ??? CKD (chronic kidney disease)

## 2021-05-05 LAB — COMPREHENSIVE METABOLIC PANEL
ALBUMIN: 2.9 g/dL — ABNORMAL LOW (ref 3.4–5.0)
ALKALINE PHOSPHATASE: 732 U/L — ABNORMAL HIGH (ref 46–116)
ALT (SGPT): <7 U/L — ABNORMAL LOW (ref 10–49)
ANION GAP: 6 mmol/L (ref 5–14)
AST (SGOT): 16 U/L (ref <=34)
BILIRUBIN TOTAL: 0.4 mg/dL (ref 0.3–1.2)
BLOOD UREA NITROGEN: 33 mg/dL — ABNORMAL HIGH (ref 9–23)
BUN / CREAT RATIO: 31
CALCIUM: 8.8 mg/dL (ref 8.7–10.4)
CHLORIDE: 101 mmol/L (ref 98–107)
CO2: 25 mmol/L (ref 20.0–31.0)
CREATININE: 1.06 mg/dL
EGFR CKD-EPI (2021) MALE: 76 mL/min/{1.73_m2} (ref >=60)
GLUCOSE RANDOM: 100 mg/dL (ref 70–179)
POTASSIUM: 4.2 mmol/L (ref 3.4–4.8)
PROTEIN TOTAL: 5.4 g/dL — ABNORMAL LOW (ref 5.7–8.2)
SODIUM: 132 mmol/L — ABNORMAL LOW (ref 135–145)

## 2021-05-05 LAB — CBC
HEMATOCRIT: 24.8 % — ABNORMAL LOW (ref 39.0–48.0)
HEMOGLOBIN: 8.2 g/dL — ABNORMAL LOW (ref 12.9–16.5)
MEAN CORPUSCULAR HEMOGLOBIN CONC: 33.2 g/dL (ref 32.0–36.0)
MEAN CORPUSCULAR HEMOGLOBIN: 30.9 pg (ref 25.9–32.4)
MEAN CORPUSCULAR VOLUME: 92.9 fL (ref 77.6–95.7)
MEAN PLATELET VOLUME: 7.9 fL (ref 6.8–10.7)
PLATELET COUNT: 133 10*9/L — ABNORMAL LOW (ref 150–450)
RED BLOOD CELL COUNT: 2.67 10*12/L — ABNORMAL LOW (ref 4.26–5.60)
RED CELL DISTRIBUTION WIDTH: 17.2 % — ABNORMAL HIGH (ref 12.2–15.2)
WBC ADJUSTED: 5 10*9/L (ref 3.6–11.2)

## 2021-05-05 MED ADMIN — Abiraterone (ZYTIGA) tablet 250 mg **Patient Supplied**: 1000 mg | ORAL | @ 14:00:00

## 2021-05-05 MED ADMIN — atorvastatin (LIPITOR) tablet 40 mg: 40 mg | ORAL | @ 12:00:00

## 2021-05-05 MED ADMIN — MORPhine (MS CONTIN) 12 hr tablet 15 mg: 15 mg | ORAL | @ 14:00:00 | Stop: 2021-05-09

## 2021-05-05 MED ADMIN — gabapentin (NEURONTIN) capsule 300 mg: 300 mg | ORAL | @ 16:00:00 | Stop: 2021-05-11

## 2021-05-05 MED ADMIN — DULoxetine (CYMBALTA) DR capsule 30 mg: 30 mg | ORAL | @ 10:00:00 | Stop: 2021-05-10

## 2021-05-05 MED ADMIN — predniSONE (DELTASONE) tablet 40 mg: 40 mg | ORAL | @ 12:00:00 | Stop: 2021-05-05

## 2021-05-05 MED ADMIN — oxyCODONE (ROXICODONE) immediate release tablet 10 mg: 10 mg | ORAL | @ 14:00:00 | Stop: 2021-05-10

## 2021-05-05 MED ADMIN — lidocaine (LIDODERM) 5 % patch 2 patch: 2 | TRANSDERMAL | @ 12:00:00

## 2021-05-05 MED ADMIN — apixaban (ELIQUIS) tablet 5 mg: 5 mg | ORAL | @ 12:00:00

## 2021-05-05 MED ADMIN — oxyCODONE (ROXICODONE) immediate release tablet 10 mg: 10 mg | ORAL | @ 10:00:00 | Stop: 2021-05-10

## 2021-05-05 MED ADMIN — apixaban (ELIQUIS) tablet 5 mg: 5 mg | ORAL | @ 01:00:00

## 2021-05-05 MED ADMIN — MORPhine (MS CONTIN) 12 hr tablet 15 mg: 15 mg | ORAL | @ 01:00:00 | Stop: 2021-05-09

## 2021-05-05 MED ADMIN — acetaminophen (TYLENOL) tablet 650 mg: 650 mg | ORAL | @ 12:00:00

## 2021-05-05 NOTE — Unmapped (Signed)
Problem: Adult Inpatient Plan of Care  Goal: Plan of Care Review  Outcome: Ongoing - Unchanged  Goal: Patient-Specific Goal (Individualized)  Outcome: Ongoing - Unchanged  Goal: Absence of Hospital-Acquired Illness or Injury  Outcome: Ongoing - Unchanged  Intervention: Identify and Manage Fall Risk  Recent Flowsheet Documentation  Taken 05/04/2021 2000 by August Saucer, RN  Safety Interventions:   low bed   bed alarm   lighting adjusted for tasks/safety   nonskid shoes/slippers when out of bed  Goal: Optimal Comfort and Wellbeing  Outcome: Ongoing - Unchanged  Goal: Readiness for Transition of Care  Outcome: Ongoing - Unchanged  Goal: Rounds/Family Conference  Outcome: Ongoing - Unchanged     Problem: Fall Injury Risk  Goal: Absence of Fall and Fall-Related Injury  Outcome: Ongoing - Unchanged  Intervention: Promote Injury-Free Environment  Recent Flowsheet Documentation  Taken 05/04/2021 2000 by August Saucer, RN  Safety Interventions:   low bed   bed alarm   lighting adjusted for tasks/safety   nonskid shoes/slippers when out of bed     Problem: VTE (Venous Thromboembolism)  Goal: VTE (Venous Thromboembolism) Symptom Resolution  Outcome: Ongoing - Unchanged  Intervention: Prevent or Manage VTE (Venous Thromboembolism)  Recent Flowsheet Documentation  Taken 05/04/2021 2000 by August Saucer, RN  Bleeding Precautions: blood pressure closely monitored     Problem: Self-Care Deficit  Goal: Improved Ability to Complete Activities of Daily Living  Outcome: Ongoing - Unchanged     Problem: Adult Inpatient Plan of Care  Goal: Absence of Hospital-Acquired Illness or Injury  Outcome: Ongoing - Unchanged  Intervention: Identify and Manage Fall Risk  Recent Flowsheet Documentation  Taken 05/04/2021 2000 by August Saucer, RN  Safety Interventions:   low bed   bed alarm   lighting adjusted for tasks/safety   nonskid shoes/slippers when out of bed     Problem: Self-Care Deficit  Goal: Improved Ability to Complete Activities of Daily Living  Outcome: Ongoing - Unchanged     Problem: Skin Injury Risk Increased  Goal: Skin Health and Integrity  Outcome: Ongoing - Unchanged

## 2021-05-05 NOTE — Unmapped (Signed)
[facility] has completed an Admission Pre-Screen in preparation for a potential admission to AIR on Saturday or Sunday, which is pending PM&R MD approval. Please note that this does NOT mean that the patient has been accepted to AIR. PM&R MD will inform the primary team of their decision on day of admission. Thank you.      Myriam Forehand, [facility]  [facility]  Inpatient Coordinator  [phone]    2:51 PM 05/05/2021

## 2021-05-05 NOTE — Unmapped (Signed)
NEUROSURGERY PROGRESS NOTE      Brief History of Present Illness  Kevin Cohen is a 70 y.o. male with PMH of HTN, HLD, and metastatic prostate cancer with known osseous mets (s/p multiple rounds of chemotherapy and radiation), who presents with several month history of symptoms concerning for cervical myelopathy and several week history of worsening LUE weakness. MRI total spine from 9/14 shows diffuse metastatic disease with epidural thickening and spinal canal stenosis from C3-5 without cord signal change, and left foraminal invasion at C5-6. He is admitted for a planned OR 10/6 but holding off on surgery at this point given anatomic challenges.     Subjective/Interval History  Stable on the floors overnight.     Interval Imaging Reviewed  none    Neurological Assessment and Plan  **Metastatic adenocarcinoma (CMS-HCC)  *Prostate Cancer with mets to cervical spine, recently established with [facility] and getting cancer treatment with hormone-directed therapy and radiation.  -Not a surgical candidate at this time  -Undergoing radiation with rad onc, started 10/6; plan to go till 10/19  -consulted PM&R and PT/OT for possible AIR admission to continue radiation while at rehab  - Prednisone taper 40mg  10/15; 30mg  10/15; 20mg  10/18; 10mg  10/21 and 5mg  10/24 (in [facility])  - scheduled morphine for pain    **Electrolyte status  Replete PRN    Non-neurologic problems being addressed    **DVT Left peroneal and politeal veins dx 04/26/21  -IVC filter placed 10/5 by [facility], needs outpt removal in 3-6 months  -on Eliquis 5mg  po bid    **Anemia  - chronic inflammatory and iron deficient  - iron supplements as tolerated.     **CKD  - baseline Cr 1.2    **HTN  - stable on home meds    Daily Maintenance:  VTE chemoprophylaxis: not needed while on Eliquis  IV Fluids: none  Bowel Regimen: Senna and Miralax  Foley status: none  Nutrition: Reg diet      Disposition: floor, full code, refer to AIR after gets PT/OT eval        For questions regarding patients, please page [facility]N Inpatient Pager: [phone].    ___________________________________________________________________      Neurological Exam  [name]  Ox3  PERRL  RUE 5 delt/bi/tri/we, 4 hg/io  LUE 2 delt 3 bi 4- tri 4 we/wf/hg/io  BLE 5/5    Right fingertips numb      The patient's vitals, intake/output, labs, orders, and relevant imaging were reviewed for the last 24 hours.    Patient Active Problem List   Diagnosis    Prostate cancer metastatic to bone (CMS-HCC)    Displacement of lumbar intervertebral disc without myelopathy    Metastatic adenocarcinoma (CMS-HCC)    DVT (deep venous thrombosis) (CMS-HCC)    Anemia    Cervical myelopathy with cervical radiculopathy (CMS-HCC)    HTN (hypertension)    CKD (chronic kidney disease)       Patient is doing well overall.  Left arm strength seems to be better then a few days ago.  Agree w/ plan for rehabilitation.  I am available should the patient have any changes or concern for spinal instability.  Otherwise at present, I believe that this is the best course of action.    Mattie Marlin, MD, MBA, MS, FAANS  Minimally Invasive and Complex Spine Surgery  Clinical Associate Professor of Neurosurgery  [facility] [employer]

## 2021-05-05 NOTE — Unmapped (Incomplete)
***THIS NOTE DOES NOT MEAN PATIENT HAS BEEN ADMITTED TO AIR. PENDING CLEARANCE BY ATTENDING OR INSURANCE***    Physical Medicine and Rehab  Post-admission Physician Evaluation (PAPE)    ASSESSMENT:     Kevin Cohen is a 70 y.o. male with PMH of ***. He/She is now admitted to [facility] for comprehensive interdisciplinary rehabilitation.     Rehab Impairment Group Code [facility]): (Spinal Cord Dysfunction - Non-Traumatic) [id number] Quadriplegia, Incomplete C5-8  Etiology: Metastatic Prostate Cancer    PLAN:     REHAB:   - PT and OT to maximize functional status with mobility and ADLs as well as prevention of joint contracture.   - RT for community re-integration, education, and leisure support services.  - P&O for assistive devices PRN.  - To be discussed in weekly Interdisciplinary Team Conference.    Non Traumatic, Cervical Spinal Cord Injury:  Etiology, operative or non operative mgmt.  ASIA exam pending.   ?? Post-operative Pain:  ? Palliative Care Consult  ? MS Contin 15 mg PO BID  ? Oxycodone 10 mg PO every 4 hours prn pain  ? Duloxetine 30 mg PO daily x 1 week and then increase to 60 mg PO daily. First dose 10/13.  ? Gabapentin to 300 gm PO nightly x 1 week and then stop. First dose 10/13.  ? Tylenol 650 mg PO q4h PRN  ? Lidocaine patch, voltaren gel PRN  ?? Neuropathic Pain  ? CTM  ?? Spasticity: No issues with spasms currently. Will monitor.   ? Baclofen 5 mg TID PRN  ? Neurogenic Bladder:  Patient endorses sensation and is able to void volitionally.***  ? I&O Cath Volume range:  ? Urology follow up at discharge  ? Strict I&O's  ?? Neurogenic Bowel: Patient is incontinent. Admission KUB ordered to assess stool burden.  Bowel clean out pending results.   ? Colace 100 mg PO TID  ? Senna 2 tabs daily with lunch  ? Bisacodyl Suppository with dig stim 10/16  ?? Equipment:  ? Place Bilateral hard PRAFO Boots to protect patient's heels from pressure injury and keep foot in neutral position to avoid PF contractures.  ?? Monitor for Autonomic Dysreflexia: Nitropaste PRN  ?? BMP and CBC every Monday/Thursday    Metastatic Prostate Cancer  - XRT M-F (-10/19)  - Zytiga 1000 mg daily  - Prednisone taper 40mg  10/15; 30mg  10/15; 20mg  10/18; 10mg  10/21 and 5mg  10/24 (in [facility])    HTN  -Amlodipine 5 mg daily    HLD?***  - Lipitor 40 mg daily    Anemia  - chronic inflammatory and iron deficient  - Ferrous sulfate 350 mg every other day  ??  CKD  - baseline Cr 1.2    DVT  Left peroneal and politeal veins dx 04/26/21  -IVC filter placed 10/5 by [facility], needs outpt removal in 3-6 months  -on Eliquis 5mg  po bid    Daily Checklist  - Diet:  - DVT PPX: {DVT prophylaxis:43629}   - GI PPX:  - Access:    Code status: ***    DISPO: Admitted to Rehab floor. Patient will be discussed at next interdisciplinary team conference.     Estimated Length of Stay: *** days    Anticipated Post-Rehab Destination / Needs: home    Medical Necessity: ***    SUBJECTIVE:     Reason for Admission: Comprehensive interdisciplinary inpatient rehabilitation program.    History of Present Illness:     ***  Today, ***. Patient denies having pain. Denies fever/sweats/chills. No recent weight changes or extremity swelling. Denies HA, dizziness, vision changes, changes in strength or sensation, issues with balance or coordination. Denies chest pain, palpitations, SOB, cough. Eating well tolerating PO, no difficulty or pain with swallowing. No N/V or abdominal pain. No issues with incontinence. BMs regular, no constipation or diarrhea. No difficulty urinating, leaking urine, dysuria. Patient sleeping well. No issues with mood.    Pre-Morbid Functional Status: Pre-Admission Functional Status:   pt reports indep rw and indep ambulation room distances at daughters home with rolling walker at recent baseline              Precautions:  Falls    Medical / Surgical History: Reviewed  No past medical history on file.  No past surgical history on file.  Social History: Reviewed     Living Environment: House  Lives With: Daughter  Home Living: One level home, Stairs to enter with rails, Standard height toilet, Tub/shower unit  Rail placement (outside): Bilateral rails  Number of Stairs to Enter (outside): 2    Family History: Pertinent as stated and otherwise reviewed and non-contributory   family history is not on file.    Allergies: Reviewed  Penicillins    Medications at Discharge from Acute Hospital: Reviewed     Your Medication List      ASK your doctor about these medications    abiraterone 250 mg Tab tablet  Commonly known as: ZYTIGA  Take 4 tablets (1,000 mg total) by mouth daily.     amLODIPine 5 MG tablet  Commonly known as: NORVASC  TAKE 1 TABLET BY MOUTH ONCE DAILY FOR BLOOD PRESSURE     atorvastatin 40 MG tablet  Commonly known as: LIPITOR  Take 40 mg by mouth daily.     benzonatate 100 MG capsule  Commonly known as: TESSALON  TAKE 1 CAPSULE BY MOUTH THREE TIMES DAILY AS NEEDED FOR COUGH     FeroSuL 325 (65 FE) MG tablet  Generic drug: ferrous sulfate  TAKE 1 TABLET BY MOUTH EVERY OTHER DAY FOR ANEMIA     gabapentin 300 MG capsule  Commonly known as: NEURONTIN  TAKE 1 CAPSULE BY MOUTH TWICE DAILY FOR PAIN     HYDROmorphone 2 MG tablet  Commonly known as: DILAUDID  TAKE 1 TABLET BY MOUTH EVERY 6 HOURS AS NEEDED FOR SEVERE PAIN FOR UP TO 12 DOSES     MORPhine 15 MG 12 hr tablet  Commonly known as: MS CONTIN  Take 1 tablet (15 mg total) by mouth every twelve (12) hours.     ondansetron 4 MG tablet  Commonly known as: ZOFRAN  TAKE 1 TABLET BY MOUTH EVERY 8 HOURS AS NEEDED FOR NAUSEA     oxyCODONE 5 MG immediate release tablet  Commonly known as: ROXICODONE  Take 1-2 tablets (5-10 mg total) by mouth every six (6) hours as needed for pain.     oxyCODONE-acetaminophen 5-325 mg per tablet  Commonly known as: PERCOCET  TAKE 1 TO 2 TABLETS BY MOUTH EVERY 8 HOURS AS NEEDED FOR PAIN     predniSONE 5 MG tablet  Commonly known as: DELTASONE  Take 1 tablet (5 mg total) by mouth daily.          Review of Systems:    Full 10 systems reviewed and negative, other than as noted in the HPI.    OBJECTIVE:     Vitals:  Temp:  [35.8 ??C (96.4 ??F)-36.2 ??C (  97.2 ??F)] 36.2 ??C (97.2 ??F)  Heart Rate:  [60-73] 60  Resp:  [16-18] 16  BP: (99-121)/(60-81) 99/60  MAP (mmHg):  [75-94] 75  SpO2:  [97 %-100 %] 97 %      Physical Exam:  GEN: *** in NAD. Resting ***  EYES: Sclera anicteric, conjunctiva clear   HENT: NCAT, MMM, OP clear  NECK: Trachea midline  RESP: CTAB, NWOB   CV: RRR, no m/r/g, ext no c/c/e, radial and DPP 2+ b/l  GI: Abd soft, NTND, NABS   GU: Foley in place with clear-yellow urine***  SKIN: No visible masses, lesions, rashes, ecchymoses, or lacerations  MSK: FROM in BUE and BLE, no notable contractures, no visible swelling or erythema over joints, joints NTTP  NEURO:   Mental Status: A&Ox3, attention intact can spell ???WORLD??? backwards, naming intact, short and long term memory intact 3/3 on word recall, speech fluid and coherent, follows commands well and answers questions appropriately   Cranial Nerve: Visual acuity 20/20 bilaterally, no visual fields deficits, PERRLA, EOMI. Facial sensation intact V1-V3 bilaterally. Full facial movements and symmetric smile. Hearing grossly intact bilaterally. Uvula midline. Shoulder shrug full and equal. Tongue midline and no abnormal movements.   Sensory: BUE and BLE sensation intact to light touch   Motor:   - RUE 5/5 shoulder abd, 5/5 EF, 5/5 EE, 5/5 WE, 5/5 HG  - LUE 5/5 shoulder abd, 5/5 EF, 5/5 EE, 5/5 WE, 5/5 HG  - RLE 5/5 HF, 5/5 KF, 5/5 KE, 5/5 DF, 5/5 PF  - LLE 5/5 HF, 5/5 KF, 5/5 KE, 5/5 DF, 5/5 PF   Tone: within normal limits, no spasticity noted   Reflexes: tricep/bicep/BR/patellar/Achilles 2+ bilaterally and symmetric, Babinski negative, Hoffman negative   Cerebellar: no abnormal or extraneous movements, FNF wnl, no dysmetria, RAM upper and lower wnl, negative Rhomberg, no pronator drift   Gait: Posture wnl. Gait steady steps w/ base/leg swing/arm swing/turning wnl. Heel and toe walking wnl, tandem gait wnl.   PSYCH: mood euthymic, affect appropriate, thought process logical     Labs and Diagnostic Studies: Reviewed    Radiology Results: Reviewed

## 2021-05-05 NOTE — Unmapped (Signed)
Palliative Care Progress Note    Consultation from Requesting Attending Physician:  [name]  Primary Care Provider:  Abram Sander, MD    Code Status:   Code Status: Full Code   Healthcare decision-maker if lacks capacity:   HCDM (patient stated preference): Noodwood,Catherine - Daughter - [phone]     Assessment/Plan:      SUMMARY:  This 70 y.o. patient is seriously ill due to metastatic prostate cancer with widespread bone metastasis throughout spine admitted for worsening pain and upper extremity weakness, complicated by co-morbid acute and chronic conditions including h/o spinal cord compression.    Symptom Assessment and Recommendations:      # Left shoulder and arm pain due to cancer: combination of nociceptive and neuropathic pain. Some improvement during this hospitalization but continues to report relatively high levels of persistent pain but using prns infrequently. No significant pain changes since adjusting regimen 10/12-10/13. Taking 2x 10mg  oxy/24 hours PRN. However, with patient-stated slight increase in breakthrough pain (from 8/10 to 10/10), recommend counseling patient to take PRN when needed. Would also consider pre-medicating approximately 1 hour prior to PT/OT sessions. Otherwise continue recommendations below for now with hopes of reducing baseline pain with PO regimen to help him be successful at AIR.  - Continue MS Contin 15 mg PO BID. Consider increasing to 15 mg PO q8h sch if pain is limiting PT/OT despite PRN regimen.  - Continue oxycodone 10 mg PO every 4 hours prn pain  - Continue duloxetine 30 mg PO daily x 1 week and then increase to 60 mg PO daily. First dose 10/13.  - Continue gabapentin to 300 gm PO nightly x 1 week and then stop. First dose 10/13.  - Continue lidocaine patch to Left deltoid  ??  # Prevention of opioid-induced constipation:   - Counseled 10/12 on risk of constipation with opioids and likely need for scheduled stool softeners.   - Continue senna 2 tabs nightly. Change miralax daily to as needed given frequent stools over last 48 hours.    Goals of care / ACP:    Decisional capacity at time of visit:  Able to make decisions for himself.     Current goals of care:  Did not discuss today.        Practical, Emotional, Spiritual Support / Other Communication and Counseling:    Brief visit but provided compassionate support and active listening. He continues to be satisfied with his family support.  - He is scheduled for follow up in Outpatient Oncology Palliative Care [facility] on 10/20 with Dr. Wanita Chamberlain. I have sent a message to the scheduler to monitor for how long he is in AIR and can reschedule visit if necessary.      Thank you for this consult. Please page Darrin Nipper or Palliative Care [phone]) if there are any questions.     Subjective:     Recent Events:  NAEON. Mr. Lamantia said his shoulder pain was overall unchanged but said he had a 7.5/10 at baseline with breakthrough to 10/10, which is slightly higher than previous. He also shared that he has had three BMs per day over the last couple of days and does not want more stool softeners/laxitives. Despite the increased BMs, he also still feels the the tightness or band feeling around his abdomen. He is okay increasing pain regimen if needed but still concerned about tolerance.       No updates to past / social / family history  Scheduled Meds:  ??? abiraterone  1,000 mg Oral Daily   ??? amLODIPine  5 mg Oral Daily   ??? apixaban  5 mg Oral BID   ??? atorvastatin  40 mg Oral Daily   ??? DULoxetine  30 mg Oral daily    Followed by   ??? [START ON 05/10/2021] DULoxetine  60 mg Oral daily   ??? ferrous sulfate  325 mg Oral Every other day   ??? gabapentin  300 mg Oral Daily   ??? lidocaine  2 patch Transdermal Daily   ??? MORPhine  15 mg Oral Q12H   ??? polyethylene glycol  17 g Oral Daily   ??? [START ON 05/06/2021] predniSONE  30 mg Oral Daily    Followed by   ??? [START ON 05/09/2021] predniSONE  20 mg Oral Daily Followed by   ??? [START ON 05/12/2021] predniSONE  10 mg Oral Daily    Followed by   ??? [START ON 05/15/2021] predniSONE  5 mg Oral Daily   ??? senna  2 tablet Oral Daily before lunch     Continuous Infusions:  PRN Meds:.acetaminophen, acetaminophen, albuterol, bisacodyL, ondansetron, oxyCODONE, oxyCODONE, SMOG     Objective:       Function:  50% - Ambulation: Mainly sit/lie / Unable to do any work, extensive disease / Self-Care:Considerable assistance required, Intake: Normal or reduced / Level of Conscious: Full or confusion    Temp:  [35.8 ??C (96.4 ??F)-36.2 ??C (97.2 ??F)] 36.2 ??C (97.2 ??F)  Heart Rate:  [60-73] 60  Resp:  [16-18] 16  BP: (99-121)/(60-81) 99/60  SpO2:  [97 %-100 %] 97 %    Physical Exam:  Constitutional: Sitting up on side of bed, appears comfortable, NAD, pleasant  Eyes: no discharge, grossly normal vision  ENMT: moist oral mucosa, grossly normal hearing  Pulm: clear to auscultation bilaterally, normal work of breathing, no accessory muscle use  CV: RRR, no edema  MSK: no sarcopenia, some tenderness to palpation on inferior aspect of left deltoid  Skin: no obvious rashes  Neuro: A&Ox4, muscle strength unable to life L arm on his own but can move L wrist independently  Psych: mood and affect appropriate for situation and congruent    Test Results:  Lab Results   Component Value Date    WBC 5.0 05/05/2021    RBC 2.67 (L) 05/05/2021    HGB 8.2 (L) 05/05/2021    HCT 24.8 (L) 05/05/2021    MCV 92.9 05/05/2021    MCH 30.9 05/05/2021    MCHC 33.2 05/05/2021    RDW 17.2 (H) 05/05/2021    PLT 133 (L) 05/05/2021    MPV 7.9 05/05/2021     Lab Results   Component Value Date    NA 132 (L) 05/05/2021    K 4.2 05/05/2021    CL 101 05/05/2021    CO2 25.0 05/05/2021    BUN 33 (H) 05/05/2021    CREATININE 1.06 05/05/2021    GLU 100 05/05/2021    CALCIUM 8.8 05/05/2021    ALBUMIN 2.9 (L) 05/05/2021    PHOS 2.9 05/03/2021      Lab Results   Component Value Date    ALKPHOS 732 (H) 05/05/2021    BILITOT 0.4 05/05/2021 PROT 5.4 (L) 05/05/2021    ALBUMIN 2.9 (L) 05/05/2021    ALT <7 (L) 05/05/2021    AST 16 05/05/2021         I personally spent 20 minutes on the floor or unit in direct patient  care. The direct patient care time included face-to-face time with the patient, reviewing the patient's chart, communicating with the family and/or other professionals and coordinating care.   Start time - stop time if >60 minutes:    Greater than 50% of this time spent on counseling/coordination of care:  Yes.   See ACP Note from today for additional billable service:  No.    Ripley Fraise, MS4    I attest that I have reviewed the student note and that the components of the history of the present illness, the physical exam, and the assessment and plan documented were performed by me or were performed in my presence by the student where I verified the documentation and performed (or re-performed) the exam and medical decision making.     Darrin Nipper, MD

## 2021-05-06 ENCOUNTER — Ambulatory Visit
Admission: TF | Admit: 2021-05-06 | Discharge: 2021-05-23 | Disposition: A | Discharge status: Home Health | Payer: MEDICARE | Source: Intra-hospital | Admitting: Spinal Cord Injury Medicine

## 2021-05-06 ENCOUNTER — Ambulatory Visit
Admission: TF | Admit: 2021-05-06 | Disposition: A | Discharge status: Still In Hospital | Payer: MEDICARE | Source: Intra-hospital | Admitting: Spinal Cord Injury Medicine

## 2021-05-06 LAB — CBC
HEMATOCRIT: 23.5 % — ABNORMAL LOW (ref 39.0–48.0)
HEMOGLOBIN: 8.1 g/dL — ABNORMAL LOW (ref 12.9–16.5)
MEAN CORPUSCULAR HEMOGLOBIN CONC: 34.2 g/dL (ref 32.0–36.0)
MEAN CORPUSCULAR HEMOGLOBIN: 31.4 pg (ref 25.9–32.4)
MEAN CORPUSCULAR VOLUME: 91.7 fL (ref 77.6–95.7)
MEAN PLATELET VOLUME: 8.2 fL (ref 6.8–10.7)
PLATELET COUNT: 122 10*9/L — ABNORMAL LOW (ref 150–450)
RED BLOOD CELL COUNT: 2.57 10*12/L — ABNORMAL LOW (ref 4.26–5.60)
RED CELL DISTRIBUTION WIDTH: 17.2 % — ABNORMAL HIGH (ref 12.2–15.2)
WBC ADJUSTED: 3.9 10*9/L (ref 3.6–11.2)

## 2021-05-06 LAB — BASIC METABOLIC PANEL
ANION GAP: 6 mmol/L (ref 5–14)
BLOOD UREA NITROGEN: 29 mg/dL — ABNORMAL HIGH (ref 9–23)
BUN / CREAT RATIO: 28
CALCIUM: 8.6 mg/dL — ABNORMAL LOW (ref 8.7–10.4)
CHLORIDE: 102 mmol/L (ref 98–107)
CO2: 25 mmol/L (ref 20.0–31.0)
CREATININE: 1.05 mg/dL
EGFR CKD-EPI (2021) MALE: 77 mL/min/{1.73_m2} (ref >=60)
GLUCOSE RANDOM: 94 mg/dL (ref 70–179)
POTASSIUM: 4.3 mmol/L (ref 3.4–4.8)
SODIUM: 133 mmol/L — ABNORMAL LOW (ref 135–145)

## 2021-05-06 LAB — PHOSPHORUS: PHOSPHORUS: 2.9 mg/dL (ref 2.4–5.1)

## 2021-05-06 LAB — MAGNESIUM: MAGNESIUM: 2.3 mg/dL (ref 1.6–2.6)

## 2021-05-06 MED ADMIN — apixaban (ELIQUIS) tablet 5 mg: 5 mg | ORAL | @ 14:00:00 | Stop: 2021-05-06

## 2021-05-06 MED ADMIN — apixaban (ELIQUIS) tablet 5 mg: 5 mg | ORAL | @ 01:00:00

## 2021-05-06 MED ADMIN — lidocaine (LIDODERM) 5 % patch 2 patch: 2 | TRANSDERMAL | @ 14:00:00 | Stop: 2021-05-06

## 2021-05-06 MED ADMIN — oxyCODONE (ROXICODONE) immediate release tablet 10 mg: 10 mg | ORAL | @ 21:00:00 | Stop: 2021-05-20

## 2021-05-06 MED ADMIN — Abiraterone (ZYTIGA) tablet 250 mg **Patient Supplied**: 1000 mg | ORAL | @ 14:00:00 | Stop: 2021-05-06

## 2021-05-06 MED ADMIN — predniSONE (DELTASONE) tablet 30 mg: 30 mg | ORAL | @ 14:00:00 | Stop: 2021-05-06

## 2021-05-06 MED ADMIN — MORPhine (MS CONTIN) 12 hr tablet 15 mg: 15 mg | ORAL | @ 14:00:00 | Stop: 2021-05-06

## 2021-05-06 MED ADMIN — MORPhine (MS CONTIN) 12 hr tablet 15 mg: 15 mg | ORAL | @ 01:00:00 | Stop: 2021-05-09

## 2021-05-06 MED ADMIN — gabapentin (NEURONTIN) capsule 300 mg: 300 mg | ORAL | @ 15:00:00 | Stop: 2021-05-06

## 2021-05-06 MED ADMIN — oxyCODONE (ROXICODONE) immediate release tablet 10 mg: 10 mg | ORAL | @ 10:00:00 | Stop: 2021-05-06

## 2021-05-06 MED ADMIN — ferrous sulfate tablet 325 mg: 325 mg | ORAL | @ 14:00:00 | Stop: 2021-05-06

## 2021-05-06 MED ADMIN — DULoxetine (CYMBALTA) DR capsule 30 mg: 30 mg | ORAL | @ 10:00:00 | Stop: 2021-05-06

## 2021-05-06 MED ADMIN — atorvastatin (LIPITOR) tablet 40 mg: 40 mg | ORAL | @ 14:00:00 | Stop: 2021-05-06

## 2021-05-06 NOTE — Unmapped (Signed)
NEUROSURGERY DISCHARGE SUMMARY    Identifying Information:   Kevin Cohen  1951/05/12  [id number]    Admit date: 04/25/2021    Discharge date: 05/06/2021     Discharge Service: Neurosurgery [facility])    Discharge Attending Physician: Seth Bake, MD    Discharge to: Acute Inpatient Rehab    Discharge Diagnoses:  Principal Problem:    Prostate cancer metastatic to bone (CMS-HCC)  Active Problems:    Displacement of lumbar intervertebral disc without myelopathy    Metastatic adenocarcinoma (CMS-HCC)    DVT (deep venous thrombosis) (CMS-HCC)    Anemia    Cervical myelopathy with cervical radiculopathy (CMS-HCC)    HTN (hypertension)    CKD (chronic kidney disease)  Resolved Problems:    * No resolved hospital problems. *      Hospital Course:   Kevin Cohen is a 70 y.o. male with a history of HTN, HLD, and metastatic prostate cancer with known osseous mets (s/p multiple rounds of chemotherapy and radiation), who presents with several month history of symptoms concerning for cervical myelopathy and several week history of worsening LUE weakness. MRI total spine from 9/14 shows diffuse metastatic disease with epidural thickening and spinal canal stenosis from C3-5 without cord signal change, and left foraminal invasion at C5-6. He was not a surgical candidate so no surgery was performed. Prednisone taper was initiated, and radiation oncology was consulted. He will continue radiation therapy treatments in [facility]. He was found to have acute DVT in the left popliteal vein and peroneal vein and was started on eliquis. He also went with VIR for IVC filter placement on 04/26/21 and tolerated that procedure well. Palliative care was consulted and will continue to follow the patient upon discharge.    At the time of discharge he was tolerating a regular diet. The patient was able to void spontaneously and have his pain controlled with P.O. pain medication. He was evaluated by Physical and Occupational Therapy and found to have 5x high weekly needs at home.  He will be discharged to [facility] on 05/06/21 in stable condition.     The patient's hospitalization has been complicated by the following clinically significant conditions requiring additional evaluation and treatment or having a significant effect of this patient's care: - Age related debility POA requiring additional resources: DME, PT, or OT  - Pt taking medications including at least one of the following: DOAC, warfarin, clopidogrel, dual antiplatelet therapy, or other anticoagulant therapy causing medication-induced coagulopathy POA requiring further treatment, investigation or monitoring  - Metastatic Cancer POA requiring further investigation, treatment, or monitoring    There is no height or weight on file to calculate BMI.            Wt Readings from Last 12 Encounters:   04/06/21 81.6 kg (180 lb)       Orders Placed This Encounter   Procedures   ??? Full Code     Standing Status:   Standing     Number of Occurrences:   1          Follow-up:  1. Radiation oncology treatments during [facility]  2. Palliative following outpatient  3. VIR paged to set up follow up for IVC filter    Procedures:   IVC filter 04/26/21      Discharge Day Services:   The patient was seen and evaluated by the Neurosurgery team on the day of discharge and deemed stable for discharge, including stable labs, vital signs, radiographic studies, and  neurologic exam.  Discharge instructions were given and explained.  Questions were answered.    Physical Exam:  General: No acute distress  Cardiovascular: Hemodynamically stable  Pulmonary: Breathing is unlabored  Neurologic:   [name]  Ox3  PERRL  RUE 5 delt/bi/tri/we, 4 hg/io  LUE 2 delt 3 bi 4- tri 4 we/wf/hg/io  BLE 5/5  ??  Right fingertips numb    _____________________________________________________________________________    Temp:  [36.1 ??C (97 ??F)-36.4 ??C (97.5 ??F)] 36.1 ??C (97 ??F)  Heart Rate:  [57-66] 57  Resp:  [14-16] 16  BP: (99-118)/(60-73) 118/69  MAP (mmHg):  [75-85] 85  SpO2:  [97 %-98 %] 97 %      Condition at Discharge:   Stable  _____________________________________________________________________________  Discharge Medications:     Your Medication List      START taking these medications    apixaban 5 mg Tab  Commonly known as: ELIQUIS  Take 1 tablet (5 mg total) by mouth Two (2) times a day.     DULoxetine 30 MG capsule  Commonly known as: CYMBALTA  Take 1 capsule (30 mg total) by mouth daily.  Start taking on: May 07, 2021     DULoxetine 60 MG capsule  Commonly known as: CYMBALTA  Take 1 capsule (60 mg total) by mouth daily.  Start taking on: May 10, 2021     lidocaine 5 % patch  Commonly known as: LIDODERM  Place 2 patches on the skin daily. Apply to affected area for 12 hours only each day (then remove patch)     polyethylene glycol 17 gram packet  Commonly known as: MIRALAX  Take 17 g by mouth daily.     senna 8.6 mg tablet  Commonly known as: SENNA  Take 2 tablets by mouth daily before lunch.        CHANGE how you take these medications    oxyCODONE 5 MG immediate release tablet  Commonly known as: ROXICODONE  Take 1-2 tablets (5-10 mg total) by mouth every six (6) hours as needed for pain.  What changed: Another medication with the same name was added. Make sure you understand how and when to take each.     oxyCODONE 5 MG immediate release tablet  Commonly known as: ROXICODONE  Take 1 tablet (5 mg total) by mouth every six (6) hours as needed for up to 5 days.  What changed: You were already taking a medication with the same name, and this prescription was added. Make sure you understand how and when to take each.     predniSONE 10 MG tablet  Commonly known as: DELTASONE  Take 3 tablets (30 mg total) by mouth daily.  What changed:   ?? medication strength  ?? how much to take     predniSONE 20 MG tablet  Commonly known as: DELTASONE  Take 1 tablet (20 mg total) by mouth daily.  Start taking on: May 09, 2021  What changed: You were already taking a medication with the same name, and this prescription was added. Make sure you understand how and when to take each.     predniSONE 10 MG tablet  Commonly known as: DELTASONE  Take 1 tablet (10 mg total) by mouth daily.  Start taking on: May 12, 2021  What changed: You were already taking a medication with the same name, and this prescription was added. Make sure you understand how and when to take each.     predniSONE 5 MG tablet  Commonly known as: DELTASONE  Take 1 tablet (5 mg total) by mouth in the morning.  Start taking on: May 15, 2021  What changed: You were already taking a medication with the same name, and this prescription was added. Make sure you understand how and when to take each.        CONTINUE taking these medications    abiraterone 250 mg Tab tablet  Commonly known as: ZYTIGA  Take 4 tablets (1,000 mg total) by mouth daily.     amLODIPine 5 MG tablet  Commonly known as: NORVASC  TAKE 1 TABLET BY MOUTH ONCE DAILY FOR BLOOD PRESSURE     atorvastatin 40 MG tablet  Commonly known as: LIPITOR  Take 40 mg by mouth daily.     benzonatate 100 MG capsule  Commonly known as: TESSALON  TAKE 1 CAPSULE BY MOUTH THREE TIMES DAILY AS NEEDED FOR COUGH     FeroSuL 325 (65 FE) MG tablet  Generic drug: ferrous sulfate  TAKE 1 TABLET BY MOUTH EVERY OTHER DAY FOR ANEMIA     gabapentin 300 MG capsule  Commonly known as: NEURONTIN  TAKE 1 CAPSULE BY MOUTH TWICE DAILY FOR PAIN     HYDROmorphone 2 MG tablet  Commonly known as: DILAUDID  TAKE 1 TABLET BY MOUTH EVERY 6 HOURS AS NEEDED FOR SEVERE PAIN FOR UP TO 12 DOSES     MORPhine 15 MG 12 hr tablet  Commonly known as: MS CONTIN  Take 1 tablet (15 mg total) by mouth every twelve (12) hours.     ondansetron 4 MG tablet  Commonly known as: ZOFRAN  TAKE 1 TABLET BY MOUTH EVERY 8 HOURS AS NEEDED FOR NAUSEA     oxyCODONE-acetaminophen 5-325 mg per tablet  Commonly known as: PERCOCET  TAKE 1 TO 2 TABLETS BY MOUTH EVERY 8 HOURS AS NEEDED FOR PAIN _____________________________________________________________________________  Pending Test Results (if blank, then none):      Most Recent Labs:  Microbiology Results (last day)     Procedure Component Value Date/Time Date/Time    COVID-19 PCR [[id number]]  (Normal) Collected: 05/05/21 1737    Lab Status: Final result Specimen: Nasopharyngeal Swab Updated: 05/05/21 2327     SARS-CoV-2 PCR Not Detected    Narrative:      This test was performed using the Abbott Alinity m SARS-CoV-2 assay which has been validated by the CLIA-certified, CAP-inspected [facility]. FDA has granted Emergency Use Authorization for this test. This real-time RT-PCR test detects SARS-CoV-2 by targeting the N and RdRp genes. Negative results do not preclude SARS-CoV-2 infection and should not be used as the sole basis for patient management decisions. Negative results must be combined with clinical observations, patient history, and epidemiological information.     Information for providers and patients can be found here: [email or web address]              Lab Results   Component Value Date    WBC 3.9 05/06/2021    HGB 8.1 (L) 05/06/2021    HCT 23.5 (L) 05/06/2021    PLT 122 (L) 05/06/2021       Lab Results   Component Value Date    NA 133 (L) 05/06/2021    K 4.3 05/06/2021    CL 102 05/06/2021    CO2 25.0 05/06/2021    BUN 29 (H) 05/06/2021    CREATININE 1.05 05/06/2021    CALCIUM 8.6 (L) 05/06/2021    MG 2.3 05/06/2021  PHOS 2.9 05/06/2021       _____________________________________________________________________________  Discharge Instructions:   Activity Instructions    Post venogram instructions:    ?? Rest for the next 24 hours:  ?? Do not drive a car for 24 hours.  ?? Take stairs leading with the opposite leg of the puncture site.  ?? Avoid strenuous activity and heavy lifting (above 10 pounds) for 1 week.  ?? Do not submerge site until healed. No tub baths, swimming pools until puncture site healed.  Showers are ok.  ?? Do not operate heavy machinery, drink alcohol or sign legal documents for 24 hours.    If site bleeds, lay flat and hold firm pressure at site for about 10 minutes.   If you cannot get the site to stop bleeding, hold firm pressure and call 911.    Continue to hold pressure until help arrives.     Dressing stays intact for 2 days.  After two days, remove dressing and gently wash site.   Dry and apply new clean dressing.   Wash site and change dressing daily until healed.    You may return to your usual diet unless otherwise directed.     Please call [facility] at [phone] and choose option 3 to speak to a nurse for any problems or questions following discharge including:    ?? Bleeding that saturates bandage  ?? Pain at site not controlled by regular pain medicines  ?? Signs of infection at site: redness, swelling, pus draining, fever above 101.50F  ?? Numbness, coolness, change in color of arm or leg.      Calls received during normal business hours (Monday through Friday from 8:00 am to 4:30 pm) will be returned within one business day. Calls received after normal business hours OR on weekends will be returned during the next business day.     If this is an after-hours urgent matter, you can call the Hospital front desk at [phone] and ask to speak with the Vascular Surgery doctor on call.    If you are experiencing a medical emergency, please call 911 or go to your nearest emergency room.                   Other Instructions     Discharge instructions      I certify that based on my evaluation of this patient, this patient requires ALS transportation services and that other forms of transport are contraindicated.  Please refer to care management transitions note for transportation details.          Follow Up instructions and Outpatient Referrals     Discharge instructions Appointments which have been scheduled for you    May 08, 2021  1:30 PM  (Arrive by 1:00 PM)  TREATMENT with [facility] TREATMENT [facility]  [facility] RADIATION ONCOLOGY [place] REGION) [address] [place] [address]  [phone]      May 09, 2021  8:15 AM  (Arrive by 7:45 AM)  TREATMENT with [facility] TREATMENT [facility]  [facility] RADIATION ONCOLOGY [facility] REGION) [address]  [place] [address]  [phone]      May 09, 2021  8:35 AM  (Arrive by 8:05 AM)  PROGRESS CHECK with Chales Salmon, MD  [facility] RADIATION ONCOLOGY [place] [facility] REGION) [address]  [place] [address]  [phone]      May 10, 2021  8:15 AM  (Arrive by 7:45 AM)  TREATMENT with [facility]ONC TREATMENT [facility]  [facility] RADIATION ONCOLOGY [place] [facility] REGION) [address]  [place] [address]  [phone]      May 11, 2021 10:00 AM  (Arrive by 9:30 AM)  LAB ONLY [place] with ADULT ONC LAB  [facility] ADULT ONCOLOGY LAB [place] [facility] REGION) [address]  [place] [address]  [phone]      May 11, 2021 11:00 AM  (Arrive by 10:30 AM)  RETURN ACTIVE [place] with Maurie Boettcher, MD  [facility] ONCOLOGY MULTIDISCIPLINARY 2ND FLR CANCER HOSP [facility] REGION) [address]  [place] [address]  [phone]      May 11, 2021 11:00 AM  (Arrive by 10:30 AM)  Danelle Earthly NURSE with [facility] INJECTION MULTI   ONCOLOGY MULTIDISCIPLINARY 2ND FLR CANCER HOSP [facility] REGION) [address]  [place] [address]  [phone]      May 11, 2021 12:00 PM  (Arrive by 11:30 AM)  NEW PALLIATIVE CARE with Jobe Marker, MD  [facility] ONCOLOGY MULTIDISCIPLINARY 2ND FLR CANCER HOSP [facility] REGION) [address]  [place] [address]  [phone]

## 2021-05-06 NOTE — Unmapped (Signed)
Physical Medicine and Rehab  Post-admission Physician Evaluation (PAPE)    ASSESSMENT:     Kevin Cohen is a 70 y.o. male  with a past medical history of metastatic prostate cancer and known bony metastases s/p chemo+RT admitted for cervical myelopathy with a neurologic level of C5 in the setting of incomplete SCI. He is now admitted to [facility] for comprehensive interdisciplinary rehabilitation.     Rehab Impairment Group Code [facility]): (Spinal Cord Dysfunction - Non-Traumatic) [id number] Quadriplegia, Incomplete C5-8  Etiology: Metastatic Prostate Cancer    PLAN:     REHAB:   - PT and OT to maximize functional status with mobility and ADLs as well as prevention of joint contracture.   - RT for community re-integration, education, and leisure support services.  - P&O for assistive devices PRN.  - To be discussed in weekly Interdisciplinary Team Conference.    Non Traumatic, Cervical Spinal Cord Injury: Presented with several month history of symptoms concerning for cervical myelopathy and several week history of worsening LUE weakness. MRI total spine from 9/14 shows diffuse metastatic disease with epidural thickening and spinal canal stenosis from C3-5 without cord signal change, and left foraminal invasion at C5-6. Patient not surgical candidate and placed on steroid taper and radiation treatment as below.  ?? Post-operative Pain:  ? Palliative Care Consult  ? MS Contin 15 mg PO BID  ? Oxycodone 10 mg PO every 4 hours prn pain  ? Duloxetine 30 mg PO daily x 1 week and then increase to 60 mg PO daily. First dose 10/13.  ? Gabapentin to 300 gm PO nightly x 1 week and then stop. First dose 10/13.  ? Tylenol 650 mg PO q4h PRN  ? Lidocaine patch, voltaren gel PRN  ?? Neuropathic Pain  ? CTM  ?? Spasticity: No issues with spasms currently. Will monitor.   ? CTM  ? Neurogenic Bladder:  Patient endorses sensation and is able to void volitionally. Will monitor PVR  ? Urology follow up at discharge  ? Strict I&O's  ?? Neurogenic Bowel: Volitional with normal sensation and no accidents. Admission KUB ordered to assess stool burden.  Bowel clean out pending results.   ? Senna 1 tabs daily PRN  ? Bisacodyl Suppository daily PRN  ?? Equipment:  ? Place Bilateral hard PRAFO Boots to protect patient's heels from pressure injury and keep foot in neutral position to avoid PF contractures.  ?? Monitor for Autonomic Dysreflexia: Nitropaste PRN  ?? BMP and CBC every Monday/Thursday    Metastatic Prostate Cancer  Previously on hospice 04/2020. Follows with Dr Philomena Course and started ADT +abi/pred. Metastatic prostate cancer with recent cord compression (T5-T6) and worsening left arm weakness, MRI total spine from 9/14 shows diffuse metastatic disease with epidural thickening and spinal canal stenosis from C3-5 without cord signal change, and left foraminal invasion at C5-6. SRN felt patient was not surgical candidate and patient started on radiation therapy and steroid taper.   - Rad/Onc consult  - Radiation treatment Monday-Friday (-10/19)  - Zytiga 1000 mg daily  - Prednisone taper: 30mg  (10/15-10/17); 20mg  (10/18-10/20);10mg  (10/21-10/23) and 5mg  (10/24-)   - Lupron 7.5mg  monthly (next dose on 10/20) until he is able to f/u in clinic for q3 month injection    HTN  - Amlodipine 5 mg daily    HLD  - Lipitor 40 mg daily    Anemia  Chronic inflammatory and iron deficiency  - Ferrous sulfate 350 mg every other day  ??  CKD  Baseline Cr ~1.0-1.2.  -BMP MTh    DVT  Left peroneal and politeal veins dx 04/26/21. IVC filter placed 10/5 by [facility]IR, needs outpt removal in 3-6 months  - Eliquis 5mg  po bid    Daily Checklist  - Diet: Regular  - DVT PPX: Pt already on theraputic anticoagulation   - GI PPX: None  - Access: None    Code status: Full    DISPO: Admitted to Rehab floor. Patient will be discussed at next interdisciplinary team conference.     Estimated Length of Stay: 14-21 days    Anticipated Post-Rehab Destination / Needs: home    Medical Necessity:  The patient requires acute inpatient rehabilitation to maximize functional independence and requires daily physician visits for monitoring/management of metastatic prostate cancer, cervical myelopathy with SCI, vital signs, anticoagulation, neurological exam, medications, neurogenic bowel and bladder, skin, spasticity and pain control. This patient's rehabilitation goals and medical complexity could not adequately be managed in a less intensive setting.  Potential risks for clinical complications include falls, adverse medication reactions, DVT/PE, pressure ulcers, wound dehiscence/infection, aspiration, urinary tract infection, dehydration/malnutrition, respiratory failure, worsening of underlying disease and bleeding.  ??  Medical Prognosis: Good for continued progress and participation with therapy.  ??  Anticipated Interdisciplinary Rehabilitation Interventions: Activity tolerance: Patient is expected to tolerate minimum of 3 hrs of therapy daily @ 5x/week. Physical therapy to work on mobility. Occupational therapy to work on self care. Recreational therapy for community reintegration and relaxation training. Rehab Nursing to work on medication administration, patient/family education, skin care, wound care, fall prevention, bowel and bladder management, vital signs and feeding/nutrition. Weekly interdisciplinary team conference to assess progress and plan of care changes.  ??  Expected Functional Outcomes:  Expected level of improvement/goals for inpatient rehabilitation include independent for wheelchair mobility, supervision or some assistance for transfers, supervision or some assistance for ambulation, supervision, setup or some assistance for self care (ADLs) and supervision for safety    SUBJECTIVE:     Reason for Admission: Comprehensive interdisciplinary inpatient rehabilitation program.    History of Present Illness:     Kevin Cohen is a 70 y.o. male  with a past medical history of metastatic prostate cancer and known bony metastases s/p chemo+RT admitted for cervical myelopathy with a neurologic level of C5 in the setting of incomplete SCI admitted to [facility] on 04/25/2021. Kevin Cohen has participated in acute inpatient physical and occupational therapies to improve functional mobility, activity tolerance, functional strength, balance, and endurance in order to facilitate safe performance of ADLs and daily routines.  Kevin Cohen has been referred to [facility] for continued acute medical management, provision of intensive inpatient therapies, and patient/family training to facilitate safe performance of ADLs and mobility prior to discharge home.     Today, patient states that he is excited to come to rehab.  Notes that his pain is currently well controlled with pain meds as scheduled by palliative.  States he is continent of bowel and bladder without any accidents. States he has been having increased bowel movements for the past few days and has been avoiding laxatives. Notes good appetite and denies any nausea or vomiting.    Pre-Morbid Functional Status: Pre-Admission Functional Status:     pt reports indep rw and indep ambulation room distances at daughters home with rolling walker at recent baseline    Current Functional Status:  Feeding - Needs Assistance: Set Up Assist  ??  Grooming - Needs Assistance: Min assist  ??  Bathing - Needs Assistance: Min assist  ??  Toileting - Needs Assistance: Min assist  ??  UB Dressing - Needs Assistance: Min assist  ??  LB Dressing - Needs Assistance: Min assist; Performed seated  ??  IADLs: NT  ??  ??  Mobility:   Bed Mobility: min assist with all  ??  Transfers: Min A  ??  Gait: amb ~52ft without device with mod assist x 1. flexed posture, difficulty unweighting LE to advance  ??  Stairs: NA  ??  Wheelchair Mobility: NA  ??  ??  Cognition/Swallow/Speech:AOx4, clear speech, following commands, regular diet  ??  DME Recommendations:  PT DME Recommendations: Defer to post acute  ??  OT DME Recommendations: Defer to post acute          Precautions:  Falls    Medical / Surgical History: Reviewed  No past medical history on file.  No past surgical history on file.  Social History: Reviewed          Family History: Pertinent as stated and otherwise reviewed and non-contributory   family history is not on file.    Allergies: Reviewed  Penicillins    Medications at Discharge from Acute Hospital: Reviewed     Your Medication List       Notice    Cannot display discharge medications because the patient has not yet been admitted.       Review of Systems:    Full 10 systems reviewed and negative, other than as noted in the HPI.    OBJECTIVE:     Vitals:  Temp:  [36.1 ??C (97 ??F)-36.4 ??C (97.5 ??F)] 36.4 ??C (97.5 ??F)  Heart Rate:  [57-68] 68  Resp:  [14-16] 14  BP: (109-118)/(69-73) 109/70  MAP (mmHg):  [82-85] 82  SpO2:  [97 %-98 %] 97 %    Physical Exam:  GEN: Pleasant gentleman resting in bed comfortably in NAD.   EYES: Sclera anicteric, conjunctiva clear   HENT: NCAT, MMM, OP clear  NECK: Trachea midline  RESP: CTAB, NWOB   CV: RRR, no m/r/g, ext no c/c/e, radial and DPP 2+ b/l  GI: Abd soft, NTND, NABS   SKIN: No visible masses, lesions, rashes, ecchymoses, or lacerations  MSK: FROM in BUE and BLE, no notable contractures, no visible swelling or erythema over joints, joints NTTP  NEURO:   Mental Status: A&Ox3, speech fluid and coherent, follows commands well and answers questions appropriately   Cranial Nerve: Visual acuity 20/20 bilaterally, no visual fields deficits, PERRLA, EOMI. Facial sensation intact V1-V3 bilaterally. Full facial movements and symmetric smile. Hearing grossly intact bilaterally. Uvula midline. Shoulder shrug full and equal. Tongue midline and no abnormal movements.   Sensory: BUE and BLE sensation intact to light touch   Motor:   - RUE 5/5 shoulder abd, 5/5 EF, 5/5 EE, 5/5 WE, 5/5 HG, 4/5 Finger abduction  - LUE 1/5 shoulder abd, 3/5 EF, 3+/5 EE, 4/5 WE, 4/5 HG, 4/5 Finger abduction  - RLE 5/5 HF, 5/5 KF, 5/5 KE, 5/5 DF, 5/5 PF  - LLE 5/5 HF, 5/5 KF, 5/5 KE, 5/5 DF, 5/5 PF   Tone: within normal limits, no spasticity noted   Reflexes: patellar/Achilles 1+ bilaterally and symmetric, Babinski negative, Hoffman negative   Cerebellar: no abnormal or extraneous movements   Gait: Deferred  PSYCH: mood euthymic, affect appropriate, thought process logical     Labs and Diagnostic Studies: Reviewed    Radiology Results: Reviewed

## 2021-05-06 NOTE — Unmapped (Signed)
Patient resting well. Not in any form of distress. Able to make needs known, alert and oriented x4. Tolerated PO medicines well. Assisted to the [facility] for toilet ting needs. Rest and comforts maintained. Fall precautions in place. Needs attended.     Problem: Adult Inpatient Plan of Care  Goal: Plan of Care Review  Outcome: Ongoing - Unchanged  Goal: Patient-Specific Goal (Individualized)  Outcome: Ongoing - Unchanged  Goal: Absence of Hospital-Acquired Illness or Injury  Outcome: Ongoing - Unchanged  Intervention: Identify and Manage Fall Risk  Recent Flowsheet Documentation  Taken 05/05/2021 2030 by Sharilyn Sites., RN  Safety Interventions:   bed alarm   fall reduction program maintained   environmental modification  Intervention: Prevent and Manage VTE (Venous Thromboembolism) Risk  Recent Flowsheet Documentation  Taken 05/05/2021 2030 by Sharilyn Sites., RN  Activity Management: activity adjusted per tolerance  Goal: Optimal Comfort and Wellbeing  Outcome: Ongoing - Unchanged  Goal: Readiness for Transition of Care  Outcome: Ongoing - Unchanged  Goal: Rounds/Family Conference  Outcome: Ongoing - Unchanged     Problem: Fall Injury Risk  Goal: Absence of Fall and Fall-Related Injury  Outcome: Ongoing - Unchanged  Intervention: Identify and Manage Contributors  Recent Flowsheet Documentation  Taken 05/05/2021 2000 by Sharilyn Sites., RN  Self-Care Promotion:   BADL personal objects within reach   BADL personal routines maintained  Intervention: Promote Injury-Free Environment  Recent Flowsheet Documentation  Taken 05/05/2021 2030 by Sharilyn Sites., RN  Safety Interventions:   bed alarm   fall reduction program maintained   environmental modification     Problem: VTE (Venous Thromboembolism)  Goal: VTE (Venous Thromboembolism) Symptom Resolution  Outcome: Ongoing - Unchanged  Intervention: Prevent or Manage VTE (Venous Thromboembolism)  Recent Flowsheet Documentation  Taken 05/05/2021 2030 by Sharilyn Sites., RN  Anti-Embolism Device Type: SCD, Knee  Anti-Embolism Intervention: Refused  Anti-Embolism Device Location: BLE     Problem: Self-Care Deficit  Goal: Improved Ability to Complete Activities of Daily Living  Outcome: Ongoing - Unchanged  Intervention: Promote Activity and Functional Independence  Recent Flowsheet Documentation  Taken 05/05/2021 2000 by Sharilyn Sites., RN  Self-Care Promotion:   BADL personal objects within reach   BADL personal routines maintained     Problem: Adult Inpatient Plan of Care  Goal: Absence of Hospital-Acquired Illness or Injury  Outcome: Ongoing - Unchanged  Intervention: Identify and Manage Fall Risk  Recent Flowsheet Documentation  Taken 05/05/2021 2030 by Sharilyn Sites., RN  Safety Interventions:   bed alarm   fall reduction program maintained   environmental modification     Problem: Self-Care Deficit  Goal: Improved Ability to Complete Activities of Daily Living  Outcome: Ongoing - Unchanged  Intervention: Promote Activity and Functional Independence  Recent Flowsheet Documentation  Taken 05/05/2021 2000 by Sharilyn Sites., RN  Self-Care Promotion:   BADL personal objects within reach   BADL personal routines maintained     Problem: Skin Injury Risk Increased  Goal: Skin Health and Integrity  Outcome: Ongoing - Unchanged  Intervention: Optimize Skin Protection  Recent Flowsheet Documentation  Taken 05/05/2021 2030 by Sharilyn Sites., RN  Head of Bed [facility]) Positioning: [facility] at 30-45 degrees  Taken 05/05/2021 2000 by Sharilyn Sites., RN  Pressure Reduction Devices: pressure-redistributing mattress utilized

## 2021-05-07 MED ADMIN — apixaban (ELIQUIS) tablet 5 mg: 5 mg | ORAL

## 2021-05-07 MED ADMIN — amLODIPine (NORVASC) tablet 5 mg: 5 mg | ORAL | @ 13:00:00

## 2021-05-07 MED ADMIN — senna (SENOKOT) tablet 2 tablet: 2 | ORAL | @ 16:00:00

## 2021-05-07 MED ADMIN — gabapentin (NEURONTIN) capsule 300 mg: 300 mg | ORAL | @ 14:00:00 | Stop: 2021-05-12

## 2021-05-07 MED ADMIN — apixaban (ELIQUIS) tablet 5 mg: 5 mg | ORAL | @ 13:00:00

## 2021-05-07 MED ADMIN — acetaminophen (TYLENOL) tablet 650 mg: 650 mg | ORAL

## 2021-05-07 MED ADMIN — abiraterone (ZYTIGA) tablet 1,000 mg: 1000 mg | ORAL | @ 14:00:00

## 2021-05-07 MED ADMIN — lidocaine (LIDODERM) 5 % patch 2 patch: 2 | TRANSDERMAL | @ 13:00:00

## 2021-05-07 MED ADMIN — docusate sodium (COLACE) capsule 100 mg: 100 mg | ORAL | @ 16:00:00

## 2021-05-07 MED ADMIN — predniSONE (DELTASONE) tablet 30 mg: 30 mg | ORAL | @ 13:00:00 | Stop: 2021-05-09

## 2021-05-07 MED ADMIN — melatonin tablet 3 mg: 3 mg | ORAL

## 2021-05-07 MED ADMIN — MORPhine (MS CONTIN) 12 hr tablet 15 mg: 15 mg | ORAL | @ 14:00:00 | Stop: 2021-05-10

## 2021-05-07 MED ADMIN — DULoxetine (CYMBALTA) DR capsule 30 mg: 30 mg | ORAL | @ 09:00:00 | Stop: 2021-05-10

## 2021-05-07 MED ADMIN — atorvastatin (LIPITOR) tablet 40 mg: 40 mg | ORAL | @ 13:00:00

## 2021-05-07 MED ADMIN — MORPhine (MS CONTIN) 12 hr tablet 15 mg: 15 mg | ORAL | @ 02:00:00 | Stop: 2021-05-10

## 2021-05-07 NOTE — Unmapped (Signed)
Patient is alert and oriented x 4 ,left side weakness ,continent of bladder, PVR x2 pending,medicated for pain x1 ,safety maintained ,bed in lowest position,call bell within reach.    Problem: Rehabilitation ([facility]) Plan of Care  Goal: Plan of Care Review  Outcome: Progressing  Goal: Patient-Specific Goal (Individualized)  Outcome: Progressing  Goal: Absence of New-Onset Illness or Injury  Outcome: Progressing  Intervention: Prevent Fall and Fall Injury  Recent Flowsheet Documentation  Taken 05/07/2021 0000 by Orland Dec, RN  Safety Interventions: fall reduction program maintained  Taken 05/06/2021 2200 by Orland Dec, RN  Safety Interventions: fall reduction program maintained  Taken 05/06/2021 2000 by Orland Dec, RN  Safety Interventions: fall reduction program maintained  Intervention: Prevent VTE (Venous Thromboembolism)  Recent Flowsheet Documentation  Taken 05/06/2021 2300 by Orland Dec, RN  VTE Prevention/Management: anticoagulant therapy  Goal: Optimal Comfort and Wellbeing  Outcome: Progressing  Goal: Home and Community Transition Plan Established  Outcome: Progressing     Problem: Fall Injury Risk  Goal: Absence of Fall and Fall-Related Injury  Outcome: Progressing  Intervention: Promote Injury-Free Environment  Recent Flowsheet Documentation  Taken 05/07/2021 0000 by Orland Dec, RN  Safety Interventions: fall reduction program maintained  Taken 05/06/2021 2200 by Orland Dec, RN  Safety Interventions: fall reduction program maintained  Taken 05/06/2021 2000 by Orland Dec, RN  Safety Interventions: fall reduction program maintained     Problem: Self-Care Deficit  Goal: Improved Ability to Complete Activities of Daily Living  Outcome: Progressing     Problem: Skin Injury Risk Increased  Goal: Skin Health and Integrity  Outcome: Progressing  Intervention: Optimize Skin Protection  Recent Flowsheet Documentation  Taken 05/07/2021 0000 by Orland Dec, RN  Pressure Reduction Techniques: frequent weight shift encouraged  Pressure Reduction Devices: heel offloading device utilized  Skin Protection: incontinence pads utilized  Taken 05/06/2021 2200 by Orland Dec, RN  Pressure Reduction Techniques: frequent weight shift encouraged  Pressure Reduction Devices: pressure-redistributing mattress utilized  Skin Protection: incontinence pads utilized  Taken 05/06/2021 2000 by Orland Dec, RN  Pressure Reduction Techniques: frequent weight shift encouraged  Head of Bed (HOB) Positioning: HOB elevated  Pressure Reduction Devices: pressure-redistributing mattress utilized  Skin Protection: incontinence pads utilized

## 2021-05-07 NOTE — Unmapped (Signed)
PHYSICAL THERAPY  Evaluation (05/07/21 1100)          Patient Name:?? Kevin Cohen????????   Medical Record Number: [id number]   Date of Birth: 02/08/1951  Sex: Male??  ??    Treatment Diagnosis: Decreased strength, endurance and balance impairing functional mobility d/t NTSCI from cervical myelopathy     Activity Tolerance: Tolerated treatment well     ASSESSMENT  Problem List: Decreased endurance, Decreased mobility, Fall Risk, Impaired ADLs, Pain, Decreased strength, Impaired balance, Impaired sensation, Postural Weakness, Gait deviation      Assessment : Kevin Cohen is a 70 y.o. male with a past medical history of metastatic prostate cancer and known bony metastases s/p chemo+RT admitted for cervical myelopathy with a neurologic level of C5 in the setting of incomplete SCI. He presented with several month history of symptoms concerning for cervical myelopathy and several week history of worsening LUE weakness. MRI total spine from 9/14 shows diffuse metastatic disease with epidural thickening and spinal canal stenosis from C3-5 without cord signal change, and left foraminal invasion at C5-6. Pt currently presents with impaired balance, decreased strength, and decreased endurance. Pt would benefit from skilled inpatient PT services to address the above impairments, decrease fall risk, and to increase safety and independence with functional mobility.      Today's Interventions: PT evaluation, established POC, discussed PT goals, orientation to rehab, rehab philosophy, diagnosis and prognosis education. Pt also educated on use of call bell and need for assist to decrease risk of falls.     Personal Factors/Comorbidities Present: 3+   Specific Comorbidities : Prostate cancer with bone metastases, lumbar intervertebral disc displacement, metastatic adenocarcinoma, recent DVT LLE, anemia, Cervical myelopathy with cervical radiculopathy, HTN, CKD   Examination of Body System: 3-5 elements   Body System: musculoskeletal, neuromuscular, cardiopulmonary, GI   Clinical Decision Making: Moderate      PLAN  Planned Frequency of Treatment:?? 1-2 hours per day, following dialysis/chemo schedule for: 5-7 days per week adjusting for dialysis/chemo schedule Planned Treatment Duration: 05/22/21     Planned Interventions: Balance activities, Education - Patient, Education - Family / caregiver, Endurance activities, Functional mobility, Home exercise program, Self-care / Home training, Therapeutic exercise, Therapeutic activity, Transfer training, Neuromuscular re-education, Museum/gallery curator, Paramedic Physical Therapy Recommendations:?? To be determined     PT DME Recommendations:  (TBD)??????????       Goals:   Patient and Family Goals: I want to get back to taking care of myself     Long Term Goal #1: Pt will be mod I with LRAD for household mobility        SHORT GOAL #1: Pt will complete all functional transfers with LRAD and SBA  ?????????????????????? Time Frame : 2 weeks  SHORT GOAL #2: Pt will ambulate 138ft with LRAD and SBA  ?????????????????????? Time Frame : 2 weeks  SHORT GOAL #3: Pt will ascend/descend 3 steps with bilateral rails and SBA  ?????????????????????? Time Frame : 2 weeks  SHORT GOAL #4: Pt will be mod I with w/c mobility for household distances using RUE and BLE  ?????????????????????? Time Frame : 2 weeks     ??????????????????????       Prognosis:?? Good  Positive Indicators: strong family support, motivation  Barriers to Discharge: Endurance deficits, Functional strength deficits, Pain, Impaired Balance, Gait instability     SUBJECTIVE  Patient reports: Pt agreeable to PT evaluation.  Current Functional Status: Pt received supine in bed, left seated  in w/c with bilat leg rests removed per pt request so he can self-propel (NA Johana informed of pt position and call bell in reach). Per last acute PT/OT notes 10/14: SBA bed mobility with HOB elevated and use of bedrail, mod A gait 25ft with HHA vs min A 24ft wth RW, sit<>stand with RW and min A.  Services patient receives: PT, OT  Prior Functional Status: PT reports indep with RW for household distances but also used w/c within home due to weakness/fatigue.  Equipment available at home: [employer], [place]      No past medical history on file.         Social History     Tobacco Use   ??? Smoking status: Not on file   ??? Smokeless tobacco: Not on file   Substance Use Topics   ??? Alcohol use: Not on file       No past surgical history on file.          History reviewed. No pertinent family history.     Allergies: Penicillins                  Objective Findings  Precautions / Restrictions  Precautions: Falls precautions, Chemo precautions  Weight Bearing Status: Non-applicable  Required Braces or Orthoses: Non-applicable     Communication Preference: Verbal          Pain Comments: 6/10 L neck, L shoulder, LUE. Pt received pain meds prior to PT session and lidocain patch donned.     Equipment / Environment: Vascular access (PIV, TLC, Port-a-cath, PICC), Patient not wearing mask for full session, Caregiver wearing mask for full session     At Rest: 112/64 HR 68  With Activity: NAD  Orthostatics: asymptomatic        Living Situation  Living Environment: House  Lives With: Daughter  Home Living: One level home, Stairs to enter with rails, Standard height toilet, Tub/shower unit  Rail placement (outside): Bilateral rails  Number of Stairs to Enter (outside): 3      Cognition: WFL  Cognition comment: pleasant and motivated to participate, demonstrates good safety awareness and command follow  Orientation: Oriented x4  Visual/Perception: Within Functional Limits (denies acute vision changes)     Skin Inspection: Intact where visualized  Skin Inspection comment: visable skin grossly intact, LUE edema     Upper Extremities  UE ROM: Right WFL, Left Impaired/Limited  UE Strength: Right WFL, Left Impaired/Limited  UE comment: dec grip strength bilat, full AROM RUE, limited movement LUE    Lower Extremities  LE ROM: Right WFL, Left WFL  LE Strength: Right WFL, Left WFL  LE comment: Pt reporting weakness L>RLE, but strength symmetrical via MMT assessment. Pt globally presents with 4/5 strength BLE     Sensation: Impaired  Sensation comment: Pt reports N/T LUE and diminished light touch R toes/foot (pt notes R foot sensory impairments from prior back surgery years ago)  Balance: Impaired, Standing balance (needs UE support)  Balance comment: standing balance with RW and min A, forward flexed posture noted. Pt requires SBA for static seated balance, CGA for dynamic seated balance EOB with functional UE ADL tasks.  Posture: Impaired  Posture comment: forward head, forward flexed posture noted in sitting and standing      Bed Mobility: supine<>sit with min A to support LUE     Transfers: Sit to Stand, Bed to Chair  Sit to Stand assistance level: Minimal assist, patient does 75% or more  Bed to Chair assistance level: Minimal assist, patient does 75% or more  Transfer comments: sit<>stand and stand pivot transfers with RW      Gait Level of Assistance: Minimal assist, patient does 75% or more  Gait: Amb 19ft then 44ft with RW and min A, w/c follow of second person for safety. Pt demo's dec stance time LLE, dec stride length bilat, dec foot clearance bilat, slow cadence, forward flexed posture.     Stairs: NT d/t safety      Wheelchair Mobility: Pt able to propel 22ft using RUE and BLE, SBA     Endurance: fair     Physical Therapy Session Duration  PT Individual [mins]: 60     Medical Staff Made Aware: RN Ethel and NA Johana     I attest that I have reviewed the above information.  SignedEssie Christine, PT  Filed 05/07/2021

## 2021-05-07 NOTE — Unmapped (Signed)
Received pt lying comfortably in bed, awake, alert and oriented x 4. No distress noted. Pt is continent to bowel and bladder. R groin incision CDI. Due meds given. Skin injury prevention and fall precaution. Needs attended. Made pt comfortable. No complain noted. Will cont to monitor.     Problem: Rehabilitation (IRF) Plan of Care  Goal: Plan of Care Review  Outcome: Progressing  Goal: Patient-Specific Goal (Individualized)  Outcome: Progressing  Goal: Absence of New-Onset Illness or Injury  Outcome: Progressing  Intervention: Prevent Fall and Fall Injury  Recent Flowsheet Documentation  Taken 05/07/2021 1200 by Hale Drone, RN  Safety Interventions:   fall reduction program maintained   low bed  Taken 05/07/2021 1000 by Hale Drone, RN  Safety Interventions:   fall reduction program maintained   low bed  Taken 05/07/2021 0800 by Hale Drone, RN  Safety Interventions:   fall reduction program maintained   low bed  Goal: Optimal Comfort and Wellbeing  Outcome: Progressing  Goal: Home and Community Transition Plan Established  Outcome: Progressing     Problem: Fall Injury Risk  Goal: Absence of Fall and Fall-Related Injury  Outcome: Progressing  Intervention: Promote Injury-Free Environment  Recent Flowsheet Documentation  Taken 05/07/2021 1200 by Hale Drone, RN  Safety Interventions:   fall reduction program maintained   low bed  Taken 05/07/2021 1000 by Hale Drone, RN  Safety Interventions:   fall reduction program maintained   low bed  Taken 05/07/2021 0800 by Hale Drone, RN  Safety Interventions:   fall reduction program maintained   low bed     Problem: Self-Care Deficit  Goal: Improved Ability to Complete Activities of Daily Living  Outcome: Progressing     Problem: Skin Injury Risk Increased  Goal: Skin Health and Integrity  Outcome: Progressing  Intervention: Optimize Skin Protection  Recent Flowsheet Documentation  Taken 05/07/2021 1200 by Hale Drone, RN  Pressure Reduction Techniques: frequent weight shift encouraged  Pressure Reduction Devices: chair cushion utilized  Skin Protection: incontinence pads utilized  Taken 05/07/2021 1000 by Hale Drone, RN  Pressure Reduction Techniques:   frequent weight shift encouraged   heels elevated off bed  Pressure Reduction Devices: pressure-redistributing mattress utilized  Skin Protection: incontinence pads utilized  Taken 05/07/2021 0800 by Hale Drone, RN  Pressure Reduction Techniques:   frequent weight shift encouraged   heels elevated off bed  Head of Bed (HOB) Positioning: HOB elevated  Pressure Reduction Devices: heel offloading device utilized  Skin Protection: incontinence pads utilized

## 2021-05-07 NOTE — Unmapped (Signed)
Physical Medicine and Rehabilitation  Daily Progress Note [facility]    ASSESSMENT:     Kevin Cohen is a 70 y.o. male ??with a past medical history of metastatic prostate cancer and known bony metastases s/p chemo+RT admitted for cervical myelopathy with a neurologic level of C5 in the setting of incomplete SCI. He is now admitted to [facility] for comprehensive interdisciplinary rehabilitation.   ??  Rehab Impairment Group Code [facility]): (Spinal Cord Dysfunction - Non-Traumatic) [id number] Quadriplegia, Incomplete C5-8  Etiology: Metastatic Prostate Cancer  ??    PLAN:   REHAB:   - PT and OT to maximize functional status with mobility and ADLs as well as prevention of joint contracture.   - RT for community re-integration, education, and leisure support services.  - P&O for assistive devices PRN.  - To be discussed in weekly Interdisciplinary Team Conference.  ??  Non Traumatic, Cervical Spinal Cord Injury: Presented with several month history of symptoms concerning for cervical myelopathy and several week history of worsening LUE weakness. MRI total spine from 9/14 shows diffuse metastatic disease with epidural thickening and spinal canal stenosis from C3-5 without cord signal change, and left foraminal invasion at C5-6. Patient not surgical candidate and placed on steroid taper and radiation treatment as below.  ?? Post-operative Pain:  ? Palliative Care Consult  ? MS Contin 15 mg PO BID  ? Oxycodone 10 mg PO every 4 hours prn pain  ? Duloxetine 30 mg PO daily x 1 week and then increase to 60 mg PO daily. First dose 10/13.  ? Gabapentin to 300 gm PO nightly x 1 week and then stop. First dose 10/13.  ? Tylenol 650 mg PO q4h PRN  ? Lidocaine patch, voltaren gel PRN  ?? Neuropathic Pain  ? CTM  ?? Spasticity: No issues with spasms currently. Will monitor.   ? CTM  ? Neurogenic Bladder:?? Patient endorses sensation and is able to void volitionally. PVR demonstrating complete volitional emptying of bladder.  ? Strict I&O's  ?? Neurogenic Bowel:??Volitional. Admission KUB with mod stool burden. Will restart bowel medications.   ? Colace 100 mg BID  ? Senna 2 tabs daily with lunch  ? Bisacodyl Suppository daily PRN  ?? Equipment:  ? Place Bilateral hard PRAFO Boots to protect patient's heels from pressure injury and keep foot in neutral position to avoid PF contractures.  ?? Monitor for Autonomic Dysreflexia: Nitropaste PRN  ?? BMP and CBC every Monday/Thursday  ??  Metastatic Prostate Cancer  Previously on hospice 04/2020. Follows with Dr Philomena Course and started ADT +abi/pred. Metastatic prostate cancer with recent cord compression (T5-T6) and worsening??left arm??weakness, MRI total spine from 9/14 shows diffuse metastatic disease with epidural thickening and spinal canal stenosis from C3-5 without cord signal change, and left foraminal invasion at C5-6. SRN felt patient was not surgical candidate and patient started on radiation therapy and steroid taper.   - Rad/Onc consult  - Radiation treatment Monday-Friday (-10/19)  - Zytiga 1000 mg daily  - Prednisone taper: 30mg  (10/15-10/17); 20mg  (10/18-10/20);10mg  (10/21-10/23) and 5mg  (10/24-)   - Lupron 7.5mg  monthly (next dose on 10/20) until he is able to f/u in clinic for q3 month injection  ??  HTN  - Amlodipine 5 mg daily  ??  HLD  - Lipitor 40 mg daily  ??  Anemia  Chronic inflammatory and iron deficiency  - Ferrous sulfate 350 mg every other day  ??  CKD  Baseline Cr ~1.0-1.2.  -BMP MTh  ??  DVT  Left peroneal and politeal veins dx 04/26/21. IVC filter placed 10/5 by VIR, needs outpt removal in 3-6 months  - Eliquis 5mg  po bid    DISPO: Patient to be discussed at weekly interdisciplinary team conference.   - EDD: TBD.   - Follow-up: PM&R, PCP, Rad/Onc, Palliative    SUBJECTIVE:     Interval Events: NAEON. Slept well overnight. States that his pain has been well controlled. Has remained volitional of bladder with PVR with no retention. Admission KUB with mod stool burden. Will restart bowel meds. Therapy evaluations planned for today.     OBJECTIVE:     Vital signs (last 24 hours):  Temp:  [36.3 ??C (97.3 ??F)-36.7 ??C (98.1 ??F)] 36.5 ??C (97.7 ??F)  Heart Rate:  [59-67] 59  Resp:  [16-18] 16  BP: (110-120)/(65-72) 110/66  MAP (mmHg):  [80-81] 80  SpO2:  [97 %-100 %] 100 %    Intake/Output (last 3 shifts):  I/O last 3 completed shifts:  In: -   Out: 250 [Urine:250]    Physical Exam:   GEN: NAD, resting comfortably in bed  EYES: sclera anicteric, conjunctiva clear   HEENT: MMM  NECK: trachea midline  CV: warm extremities, well perfused, no cyanosis,   RESP:  NWOB on RA  ABD: Not distended  SKIN: no visible masses, lesions, rashes, ecchymoses.   MSK: no notable contractures or ext swelling  NEURO:Alert, MAE, speech fluid and coherent, responds appropriately to questions   PSYCH: mood euthymic, affect appropriate, thought process logical         Medications:  Scheduled   ??? abiraterone (ZYTIGA) tablet 1,000 mg Daily   ??? amLODIPine (NORVASC) tablet 5 mg Daily   ??? apixaban (ELIQUIS) tablet 5 mg BID   ??? atorvastatin (LIPITOR) tablet 40 mg Daily   ??? DULoxetine (CYMBALTA) DR capsule 30 mg daily   ??? gabapentin (NEURONTIN) capsule 300 mg Daily   ??? lidocaine (LIDODERM) 5 % patch 2 patch Daily   ??? MORPhine (MS CONTIN) 12 hr tablet 15 mg Q12H   ??? predniSONE (DELTASONE) tablet 30 mg Daily   ??? senna (SENOKOT) tablet 2 tablet Daily with lunch     PRN acetaminophen, 650 mg, Q6H PRN  albuterol, 2 puff, Q4H PRN  bisacodyL, 10 mg, Daily PRN  calcium carbonate, 200 mg of elem calcium, TID PRN  guaiFENesin, 100 mg, Q4H PRN  melatonin, 3 mg, Nightly PRN  ondansetron, 4 mg, Q6H PRN  oxyCODONE, 5 mg, Q4H PRN   Or  oxyCODONE, 10 mg, Q4H PRN  polyethylene glycol, 17 g, Daily PRN  simethicone, 80 mg, TID PRN          Labs/Studies: Reviewed     Radiology Results: no new

## 2021-05-08 LAB — CBC
HEMATOCRIT: 22 % — ABNORMAL LOW (ref 39.0–48.0)
HEMOGLOBIN: 7.5 g/dL — ABNORMAL LOW (ref 12.9–16.5)
MEAN CORPUSCULAR HEMOGLOBIN CONC: 34.1 g/dL (ref 32.0–36.0)
MEAN CORPUSCULAR HEMOGLOBIN: 31.4 pg (ref 25.9–32.4)
MEAN CORPUSCULAR VOLUME: 92.2 fL (ref 77.6–95.7)
MEAN PLATELET VOLUME: 8.4 fL (ref 6.8–10.7)
PLATELET COUNT: 114 10*9/L — ABNORMAL LOW (ref 150–450)
RED BLOOD CELL COUNT: 2.38 10*12/L — ABNORMAL LOW (ref 4.26–5.60)
RED CELL DISTRIBUTION WIDTH: 17.9 % — ABNORMAL HIGH (ref 12.2–15.2)
WBC ADJUSTED: 5.2 10*9/L (ref 3.6–11.2)

## 2021-05-08 LAB — BASIC METABOLIC PANEL
ANION GAP: 9 mmol/L (ref 5–14)
BLOOD UREA NITROGEN: 25 mg/dL — ABNORMAL HIGH (ref 9–23)
BUN / CREAT RATIO: 24
CALCIUM: 8.7 mg/dL (ref 8.7–10.4)
CHLORIDE: 103 mmol/L (ref 98–107)
CO2: 22.8 mmol/L (ref 20.0–31.0)
CREATININE: 1.03 mg/dL
EGFR CKD-EPI (2021) MALE: 79 mL/min/{1.73_m2} (ref >=60)
GLUCOSE RANDOM: 95 mg/dL (ref 70–179)
POTASSIUM: 3.8 mmol/L (ref 3.4–4.8)
SODIUM: 135 mmol/L (ref 135–145)

## 2021-05-08 MED ADMIN — ferrous sulfate tablet 325 mg: 325 mg | ORAL | @ 14:00:00

## 2021-05-08 MED ADMIN — MORPhine (MS CONTIN) 12 hr tablet 15 mg: 15 mg | ORAL | @ 14:00:00 | Stop: 2021-05-10

## 2021-05-08 MED ADMIN — gabapentin (NEURONTIN) capsule 300 mg: 300 mg | ORAL | @ 17:00:00 | Stop: 2021-05-12

## 2021-05-08 MED ADMIN — amLODIPine (NORVASC) tablet 5 mg: 5 mg | ORAL | @ 14:00:00

## 2021-05-08 MED ADMIN — abiraterone (ZYTIGA) tablet 1,000 mg: 1000 mg | ORAL | @ 14:00:00

## 2021-05-08 MED ADMIN — atorvastatin (LIPITOR) tablet 40 mg: 40 mg | ORAL | @ 14:00:00

## 2021-05-08 MED ADMIN — docusate sodium (COLACE) capsule 100 mg: 100 mg | ORAL | @ 14:00:00

## 2021-05-08 MED ADMIN — apixaban (ELIQUIS) tablet 5 mg: 5 mg | ORAL | @ 14:00:00

## 2021-05-08 MED ADMIN — melatonin tablet 3 mg: 3 mg | ORAL | @ 01:00:00

## 2021-05-08 MED ADMIN — DULoxetine (CYMBALTA) DR capsule 30 mg: 30 mg | ORAL | @ 10:00:00 | Stop: 2021-05-10

## 2021-05-08 MED ADMIN — docusate sodium (COLACE) capsule 100 mg: 100 mg | ORAL | @ 01:00:00

## 2021-05-08 MED ADMIN — lidocaine (LIDODERM) 5 % patch 2 patch: 2 | TRANSDERMAL | @ 14:00:00

## 2021-05-08 MED ADMIN — MORPhine (MS CONTIN) 12 hr tablet 15 mg: 15 mg | ORAL | @ 01:00:00 | Stop: 2021-05-10

## 2021-05-08 MED ADMIN — apixaban (ELIQUIS) tablet 5 mg: 5 mg | ORAL | @ 01:00:00

## 2021-05-08 MED ADMIN — oxyCODONE (ROXICODONE) immediate release tablet 10 mg: 10 mg | ORAL | @ 10:00:00 | Stop: 2021-05-20

## 2021-05-08 MED ADMIN — predniSONE (DELTASONE) tablet 30 mg: 30 mg | ORAL | @ 14:00:00 | Stop: 2021-05-08

## 2021-05-08 NOTE — Unmapped (Signed)
Care Management  Initial Transition Planning Assessment  Patient's discharge address and phone number verified as correct in demographics. Number home levels 1 and entrance steps 3 with bilateral rails. Pt lives with daughter and SIL.  Current HH is setup in the home: No. Current DME in home:  RW and WC. Daughter is this patient's Insurance risk surveyor. Pt will be picked up at discharge by Daughter.      CM met with patient in pt room.  Pt/visitors were wearing hospital provided masks for the duration of the interaction with CM.   CM was wearing hospital provided surgical mask.  CM was within 6 foot of the patient/visitors during this interaction.      Type of Residence: Mailing Address:  [address] [place] [address]  Contacts: Accompanied by: Family member          Medical Provider(s): Abram Sander, MD  Reason for Admission: Admitting Diagnosis:  SCI-Clavicle Myelopathy  Past Medical History:   has no past medical history on file.  Past Surgical History:   has no past surgical history on file.   Previous admit date: 04/25/2021    Primary Insurance- Payor: HU[employer] MEDICARE ADV / Plan: HU[employer]NA GOLD PLUS HMO / Product Type: *No Product type* /   Secondary Insurance - Secondary Insurance  MEDICAID [place]  Prescription Coverage - yes  Preferred Pharmacy - [id number] - [place] ([place]), [place] - [address]  [facility]    Transportation home: Private vehicle                 General  Care Manager assessed the patient by : In person interview with patient, Medical record review, Discussion with Clinical Care team  Orientation Level: Oriented X4  Functional level prior to admission: Independent  Reason for referral: Discharge Planning    Contact/Decision Maker  Extended Emergency Contact Information  Primary Emergency Contact: Deyton, Ellenbecker  Mobile Phone: [phone]  Relation: Daughter  Secondary Emergency Contact: Noodwood,Catherine  Mobile Phone: [phone]  Relation: Daughter  Preferred language: ENGLISH  Interpreter needed? No    Legal Next of Kin / Guardian / POA / Advance Directives     HCDM (patient stated preference): Noodwood,Catherine - Daughter - [phone]    Advance Directive (Medical Treatment)  Does patient have an advance directive covering medical treatment?: Patient does not have advance directive covering medical treatment.  Reason patient does not have an advance directive covering medical treatment:: Patient does not wish to complete one at this time.    Health Care Decision Maker [HCDM] (Medical & Mental Health Treatment)  Healthcare Decision Maker: Patient does not wish to appoint a Health Care Decision Maker at this time  Information offered on HCDM, Medical & Mental Health advance directives:: Patient declined information.    Advance Directive (Mental Health Treatment)  Does patient have an advance directive covering mental health treatment?: Patient does not have advance directive covering mental health treatment.  Reason patient does not have an advance directive covering mental health treatment:: Patient does not wish to complete one at this time.    Readmission Information    Have you been hospitalized in the last 30 days?: Yes  Name of Hospital: [facility]  Were you being cared for at a skilled nursing facility:: No     What day were you discharged from that hospital or facility?: 05/06/21  Number of Days between previous discharge and readmission date: 1-3 days  Type of Readmission: Planned Readmission            Did the following happen with your discharge?                                                     Patient Information  Lives with: Children, Family members    Type of Residence: Private residence        Location/Detail: [address] [place], [place] [address]/2 level home with 3 entrance steps and bilateral rails (close together)    Support Systems/Concerns: Children, Family Members    Responsibilities/Dependents at home?: No    Home Care services in place prior to admission?: No                  Equipment Currently Used at Home: wheelchair, manual, walker, rolling       Currently receiving outpatient dialysis?: No       Financial Information       Need for financial assistance?: No       Social Determinants of Health  Social Determinants of Health were addressed in provider documentation.  Please refer to patient history.  Social Determinants of Health     Tobacco Use: Medium Risk   ??? Smoking Tobacco Use: Former Smoker   ??? Smokeless Tobacco Use: Unknown   Alcohol Use: Not on file   Financial Resource Strain: Low Risk    ??? Difficulty of Paying Living Expenses: Not very hard   Food Insecurity: No Food Insecurity   ??? Worried About Running Out of Food in the Last Year: Never true   ??? Ran Out of Food in the Last Year: Never true   Transportation Needs: No Transportation Needs   ??? Lack of Transportation (Medical): No   ??? Lack of Transportation (Non-Medical): No   Physical Activity: Not on file   Stress: Not on file   Social Connections: Not on file   Intimate Partner Violence: Not on file   Depression: Not on file   Housing/Utilities: Low Risk    ??? Within the past 12 months, have you ever stayed: outside, in a car, in a tent, in an overnight shelter, or temporarily in someone else's home (i.e. couch-surfing)?: No   ??? Are you worried about losing your housing?: No   ??? Within the past 12 months, have you been unable to get utilities (heat, electricity) when it was really needed?: No   Substance Use: Not on file   Health Literacy: Medium Risk   ??? : Rarely       Complex Discharge Information                                                                    Interventions:       Discharge Needs Assessment  Concerns to be Addressed: coping/stress, discharge planning    Clinical Risk Factors: > 65, New Diagnosis, Principal Diagnosis: Cancer, Stroke, COPD, Heart Failure, AMI, Pneumonia, Joint Replacment, Multiple Diagnoses (Chronic), Functional Limitations, Readmission < 72 Hours    Barriers to taking medications: No    Prior overnight hospital  stay or ED visit in last 90 days: Yes              Anticipated Changes Related to Illness: inability to care for self    Equipment Needed After Discharge: other (see comments) (TBD)    Discharge Facility/Level of Care Needs:      Readmission  Risk of Unplanned Readmission Score: UNPLANNED READMISSION SCORE: 24.49%  Predictive Model Details          24% (High)  Factor Value    Calculated 05/08/2021 12:04 29% Number of active Rx orders 49     Risk of Unplanned Readmission Model 9% Diagnosis of cancer present     8% Charlson Comorbidity Index 8     7% Latest BUN high (25 mg/dL)     7% Encounter of ten days or longer in last year present     6% Imaging order present in last 6 months     5% Latest hemoglobin low (7.5 g/dL)     5% Phosphorous result present     5% Age 5     4% Number of hospitalizations in last year 1     4% Diagnosis of deficiency anemia present     4% Active anticoagulant Rx order present     4% Active corticosteroid Rx order present     3% Diagnosis of renal failure present     2% Future appointment scheduled     2% Current length of stay 1.86 days      Readmitted Within the Last 30 Days? (No if blank) Yes  Patient at risk for readmission?: Yes    Discharge Plan  Screen findings are: Discharge planning needs identified or anticipated (Comment).    Expected Discharge Date:     Expected Transfer from Critical Care:      Quality data for continuing care services shared with patient and/or representative?: Yes  Patient and/or family were provided with choice of facilities / services that are available and appropriate to meet post hospital care needs?: Yes       Initial Assessment complete?: Yes

## 2021-05-08 NOTE — Unmapped (Signed)
Physical Medicine and Rehabilitation  Daily Progress Note [facility]    ASSESSMENT:     Kevin Cohen is a 70 y.o. male ??with a past medical history of metastatic prostate cancer and known bony metastases s/p chemo+RT admitted for cervical myelopathy with a neurologic level of C5 in the setting of incomplete SCI. He is now admitted to [facility] for comprehensive interdisciplinary rehabilitation.   ??  Rehab Impairment Group Code [facility]): (Spinal Cord Dysfunction - Non-Traumatic) [id number] Quadriplegia, Incomplete C5-8  Etiology: Metastatic Prostate Cancer  ??    PLAN:   REHAB:   - PT and OT to maximize functional status with mobility and ADLs as well as prevention of joint contracture.   - RT for community re-integration, education, and leisure support services.  - P&O for assistive devices PRN.  - To be discussed in weekly Interdisciplinary Team Conference.  ??  Non Traumatic, Cervical Spinal Cord Injury: Presented with several month history of symptoms concerning for cervical myelopathy and several week history of worsening LUE weakness. MRI total spine from 9/14 shows diffuse metastatic disease with epidural thickening and spinal canal stenosis from C3-5 without cord signal change, and left foraminal invasion at C5-6. Patient not surgical candidate and placed on steroid taper and radiation treatment as below.  ?? Post-operative Pain:  ? Palliative Care Consult  ? MS Contin 15 mg PO BID  ? Oxycodone 10 mg PO every 4 hours prn pain  ? Duloxetine 30 mg PO daily x 1 week and then increase to 60 mg PO daily. First dose 10/13.  ? Gabapentin to 300 gm PO nightly x 1 week and then stop. First dose 10/13.  ? Tylenol 650 mg PO q4h PRN  ? Lidocaine patch, voltaren gel PRN  ?? Neuropathic Pain  ? CTM  ?? Spasticity: No issues with spasms currently. Will monitor.   ? CTM  ? Bladder:?? Patient endorses sensation and is able to void volitionally.   ? Strict I&O's  ?? Bowel:??Volitional. Admission KUB with mod stool burden.   ? Colace 100 mg BID  ? Senna 2 tabs daily with lunch  ? Bisacodyl Suppository daily PRN  ?? Equipment:  ? Place Bilateral hard PRAFO Boots to protect patient's heels from pressure injury and keep foot in neutral position to avoid PF contractures.  ?? Monitor for Autonomic Dysreflexia: Nitropaste PRN  ?? BMP and CBC every Monday/Thursday  ??  Metastatic Prostate Cancer  Previously on hospice 04/2020. Follows with Dr Philomena Course and started ADT +abi/pred. Metastatic prostate cancer with recent cord compression (T5-T6) and worsening??left arm??weakness, MRI total spine from 9/14 shows diffuse metastatic disease with epidural thickening and spinal canal stenosis from C3-5 without cord signal change, and left foraminal invasion at C5-6. SRN felt patient was not surgical candidate and patient started on radiation therapy and steroid taper.   - Rad/Onc consult  - Radiation treatment Monday-Friday (-10/19)  - Zytiga 1000 mg daily  - Prednisone taper: 30mg  (10/15-10/17); 20mg  (10/18-10/20);10mg  (10/21-10/23) and 5mg  (10/24-)   - Lupron 7.5mg  monthly (next dose on 10/20) until he is able to f/u in clinic for q3 month injection  ??  HTN  - Amlodipine 5 mg daily  ??  HLD  - Lipitor 40 mg daily  ??  Anemia  Chronic inflammatory and iron deficiency  - Ferrous sulfate 350 mg every other day  ??  CKD  Baseline Cr ~1.0-1.2.  -BMP MTh  ??  DVT  Left peroneal and politeal veins dx 04/26/21. IVC filter placed  10/5 by VIR, needs outpt removal in 3-6 months  - Eliquis 5mg  po bid    DISPO: Patient to be discussed at weekly interdisciplinary team conference.   - EDD: TBD.   - Follow-up: PM&R, PCP, Rad/Onc, Palliative    SUBJECTIVE:     Interval Events: NAEON. Patient states he is doing well this morning. Did well with therapy evaluations yesterday and is working well with therapy this morning. States that pain has been well controlled with medications. Remains continent of bowel and bladder. Is scheduled to resume radiation therapy this afternoon. No further complaints this morning.      OBJECTIVE:     Vital signs (last 24 hours):  Temp:  [36.7 ??C (98.1 ??F)-36.9 ??C (98.4 ??F)] 36.7 ??C (98.1 ??F)  Heart Rate:  [64-67] 64  Resp:  [18] 18  BP: (114-140)/(67-70) 114/69  MAP (mmHg):  [81-87] 81  SpO2:  [99 %-100 %] 100 %    Intake/Output (last 3 shifts):  I/O last 3 completed shifts:  In: 500 [P.O.:500]  Out: 200 [Urine:200]    Physical Exam:   GEN: NAD, seen walking with front wheel walker with therapy moving to bed  EYES: sclera anicteric, conjunctiva clear   HEENT: MMM  NECK: trachea midline  CV: warm extremities, well perfused, no cyanosis,   RESP:  NWOB on RA  ABD: Not distended  SKIN: no visible masses, lesions, rashes, ecchymoses.   MSK: no notable contractures or ext swelling  NEURO:Alert, MAE, speech fluid and coherent, responds appropriately to questions   PSYCH: mood euthymic, affect appropriate, thought process logical         Medications:  Scheduled   ??? abiraterone (ZYTIGA) tablet 1,000 mg Daily   ??? amLODIPine (NORVASC) tablet 5 mg Daily   ??? apixaban (ELIQUIS) tablet 5 mg BID   ??? atorvastatin (LIPITOR) tablet 40 mg Daily   ??? docusate sodium (COLACE) capsule 100 mg BID   ??? DULoxetine (CYMBALTA) DR capsule 30 mg daily   ??? ferrous sulfate tablet 325 mg Every other day   ??? gabapentin (NEURONTIN) capsule 300 mg Daily   ??? lidocaine (LIDODERM) 5 % patch 2 patch Daily   ??? MORPhine (MS CONTIN) 12 hr tablet 15 mg Q12H   ??? senna (SENOKOT) tablet 2 tablet Daily with lunch     PRN acetaminophen, 650 mg, Q6H PRN  albuterol, 2 puff, Q4H PRN  bisacodyL, 10 mg, Daily PRN  calcium carbonate, 200 mg of elem calcium, TID PRN  guaiFENesin, 100 mg, Q4H PRN  melatonin, 3 mg, Nightly PRN  ondansetron, 4 mg, Q6H PRN  oxyCODONE, 5 mg, Q4H PRN   Or  oxyCODONE, 10 mg, Q4H PRN  polyethylene glycol, 17 g, Daily PRN  simethicone, 80 mg, TID PRN          Labs/Studies: Reviewed     Radiology Results: no new

## 2021-05-08 NOTE — Unmapped (Addendum)
OCCUPATIONAL THERAPY  Evaluation (05/07/21 1300)    Patient Name:  Kevin Cohen       Medical Record Number: [id number]   Date of Birth: April 06, 1951  Sex: Male          OT Treatment Diagnosis:  Per medical record, Kevin Cohen is a 70 y.o. male  with a past medical history of metastatic prostate cancer and known bony metastases s/p chemo+RT admitted for cervical myelopathy with a neurologic level of C5 in the setting of incomplete SCI. He is now admitted to [facility] for comprehensive interdisciplinary rehabilitation.    Assessment  Problem List: Impaired ADLs, Decreased endurance, Decreased mobility, Decreased range of motion, Decreased strength, Fall Risk, Impaired balance, Impaired fine motor skills, Pain    Assessment: Social history obtained from pt who reports being independent with ADLs up until ~3 wks ago. Pt relies on daughter for IADLs and typically uses a RW for functional mobility (also has a manual w/c). Today, OT educated pt on OT role/POC in rehab setting, then OT engaged pt in BUE assessment. LUE deficits noted as detailed below. Pt participated in upper and lower body bathing at sink. While in standing, OT encouraged pt to bear weight through LUE while using RUE to wash face; pt able to sustain dynamic standing balance for ~3 mins before needing to sit down; OT provided CGA-Min A for dynamic standing balance (more so Min A as pt's fatigue level increased). Pt able to brush teeth while seated in w/c at sink with set up assist. Pt completed upper body bathing with Min A. OT educated pt on one-handed technique to doff/don shirt in which he was able to do so with Min A; OT also encouraged bilateral integration of B hands during all ADL completion; pt receptive and able to utilize without issue. Pt completed anterior lower body bathing while seated in w/c at sink with set up assist, followed by posterior lower body bathing in standing with Min A, using sink for balance/stability. Pt required Min A to doff/don underwear and pants, with most difficulty noted to be threading clothing onto BLE.  Pt able to don slip on shoes with set up assist. OT engaged pt in practicing [facility] transfers from various surfaces (i.e. recliner, w/c, bed) in which he was able to do so with CGA (Min A for some stands) and use of RW. Lastly, OT educated pt on performing self ROM of LUE using RUE in which he was receptive and able to return demonstrate without issue.    Today's Interventions: Adaptive equipment, ADL retraining, Balance activities, Compensatory tech. training, Conservation, Education - Patient, Education - Family / caregiver, Endurance activities, Functional mobility, Neuromuscular re-education, Range of motion, Safety education, Therapeutic exercise, Transfer training, UE Strength / coordination exercise  Today's Interventions: AE, ADLs, balance, compensatory tech, pt/staff education, endurance, functional mobility, neuro re-ed, ROM, safety, therex, transfers, UE strength    Activity Tolerance During Today's Session  Tolerated treatment well    Plan  Planned Frequency of Treatment:  1-2x per day for: 5-7 days per week       Planned Interventions:  Adaptive equipment, ADL retraining, Balance activities, Bed mobility, Compensatory tech. training, Conservation, Education - Patient, Education - Family / caregiver, Endurance activities, Functional mobility, Neuromuscular re-education, Range of motion, Safety education, Therapeutic exercise, Transfer training, UE Strength / coordination exercise    Post-Discharge Occupational Therapy Recommendations:         -        GOALS:  Patient and Family Goals: To get back to walking.    IP Long Term Goal #1: Pt will complete all ADLs with supervision by discharge.       Short Term:  Pt will complete toilet transfer and hygiene with supervision.   Time Frame : 2 weeks  Pt will complete full body dressing with use of AE PRN and supervision.   Time Frame : 2 weeks  Pt will complete shower routine while seated with supervision.   Time Frame : 2 weeks  Pt will tolerate ~5 minutes of standing grooming tasks at sink to increase overall endurance during ADL completion.   Time Frame : 2 weeks  Pt will independently complete LUE HEP.   Time Frame : 1 week    Prognosis:  Good  Positive Indicators:     Barriers to Discharge: Endurance deficits, Functional strength deficits, Pain, Impaired Balance, Gait instability, Decreased range of motion    Subjective  Current Status Pt received and left sitting in w/c with all needs within reach, including call bell. RN aware.  Prior Functional Status Per pt, up until ~3 wks ago he was independent with ADLs; pt typically relies on daughter for IADLs. In regards to functional mobility, pt uses a RW (also has a manual w/c).       Services patient receives: OT, PT    Patient / Caregiver reports: Thank you for all your help.    No past medical history on file. Social History     Tobacco Use   ??? Smoking status: Not on file   ??? Smokeless tobacco: Not on file   Substance Use Topics   ??? Alcohol use: Not on file      No past surgical history on file. History reviewed. No pertinent family history.     Penicillins     Objective Findings  Precautions / Restrictions  Falls precautions, Chemo precautions    Weight Bearing  Non-applicable    Required Braces or Orthoses  Non-applicable    Communication Preference  Verbal    Pain  Pt reported 7/10 pain in LUE at beginning of session; RN aware.    Equipment / Environment  Vascular access (PIV, TLC, Port-a-cath, PICC), Patient not wearing mask for full session, Caregiver wearing mask for full session    Living Situation  Living Environment: House  Lives With: Daughter  Home Living: One level home, Stairs to enter with rails, Standard height toilet, Tub/shower unit, Grab bars in shower  Rail placement (outside): Bilateral rails  Number of Stairs to Enter (outside): 3  Equipment available at home: [employer], [employer]     Cognition   Orientation Level:  Oriented x 4   Arousal/Alertness:  Appropriate responses to stimuli   Attention Span:  Appears intact   Memory:  Appears intact   Following Commands:  Follows all commands and directions without difficulty   Safety Judgment:  Good awareness of safety precautions   Awareness of Errors:  Good awareness of safety precautions   Problem Solving:  Able to problem solve independently   Comments:      Vision / Hearing   Vision: Wears glasses for distance only, Glasses not present     Hearing: No deficit identified         Hand Function:  Right Hand Function: Right hand function impaired  Right Hand Impairment: grip strength fair  Left Hand Function: Left hand function impaired  Left Hand Impairment: grip strength fair  Hand Dominance: Right  Skin Inspection:  Skin Inspection: Intact where visualized    ROM / Strength:  UE ROM/Strength: Right WFL, Left Impaired/Limited  LUE Impairment: Reduced strength, Limited AROM, Limited PROM, Pain with movement  UE ROM/ Strength Comment: Pt able to complete AROM of L hand digits and wrist; pt unable to complete any AROM of L elbow or shoulder. OT able to passively range LUE to ~110 degrees before pt c/o pain.       Coordination:  Coordination: WFL    Sensation:  RUE Sensation: RUE intact  LUE Sensation: LUE impaired (pt reports feelings of dullness in upper L shoulder)    Balance:       Functional Mobility  Transfer Assistance Needed: Yes  Transfers - Needs Assistance: Contact Guard assist  Ambulation: Pt ambulated ~21ft in room with RW and CGA.      ADLs  ADLs: Needs assistance with ADLs  ADLs - Needs Assistance: Grooming, Bathing, Toileting, UB dressing, LB dressing, Feeding  Feeding - Needs Assistance: Set Up Assist  Grooming - Needs Assistance: Set Up Assist  UB Dressing - Needs Assistance: Min assist  LB Dressing - Needs Assistance: Min assist      Vitals / Orthostatics  At Rest: N/A  With Activity: N/A  Orthostatics: N/A      Medical Staff Made Aware: RN aware.      Occupational Therapy Session Duration  OT Individual [mins]: 120         I attest that I have reviewed the above information.  Signed: Loistine Simas, OT  Filed 05/07/2021

## 2021-05-08 NOTE — Unmapped (Signed)
Recreational Therapy Evaluation  05/08/2021     Patient Name:  Kevin Cohen       Medical Record Number: [id number]   Date of Birth: 1951/02/11  Sex: Male          Room/Bed:  4H212/4H212-01    Eval Duration: 32 Min.    Assessment  Pt is a 70 y.o. male ??with a past medical history of metastatic prostate cancer and known bony metastases s/p chemo+RT admitted for cervical myelopathy with a neurologic level of C5 in the setting of incomplete SCI. He is now admitted to [facility] for comprehensive interdisciplinary rehabilitation. Pt seen this AM to complete initial RT assessment discussing his PLOF and participation in therapies thus far. Pt appears motivated to continue making physical gains specifically with his LUE now impaired and would benefit from ongoing education re: dx/px. Pt denies issues currently with mood/adjustment however stressors surrounding his pain management. LRT provided education re: benefits of increased leisure/wellness pursuits as a 4th hour of participation. Pt is agreeable with RT POC and goals established below; see full assessment for details.    ??  Plan of Care  1x per day for: 1-2x week   Planned Treatment Duration 2 weeks    Motivators: Able to Identify motivators  Patient's Motivators: pt motivated to participate in therapy program  Patient's Identified Treatment Goal: get my Left arm moving  Patient's Stressors / Triggers: decreased strength/mobility, decreased independence, pain management  Treatment Plan developed in collaboration with: Patient, Treatment Team (No family present)  Interventions: Adaptive equipment, Adjustment to hospitalization, Health Care Encounters and Medical Condition, Barrier education, Communication and social skills, Community reintegration, Pharmacologist, Discharge planning, Education - Patient, Education - Family / caregiver, Printmaker, Functional cognitive skills, Goal setting, Leisure, Promoting social interaction, Stress management, Wellness / recovery     Goals:  Within 4 tx sessions, pt will engage in 2 leisure/wellness pursuits OOB to increase return to PLOF / daily routine.   Prior to d/c, after education, pt will identify at least 2 safety considerations for return to home/community involvement post d/c.       Subjective    Current Situation: Pt received sitting up in bed with RN present this AM at time of LRT arrival.  Cognitive, Emotional, Physical, Social, and Leisure/Life functioning were assessed:: Patient Interviews, Review of Chart, Treatment Team, Observation in Activities/Interventions, No family present  Employment: Retired  Economist: [employer]  Lives With: Family  Precautions: Falls precautions, Chemo Insurance underwriter / Environment: Patient not wearing mask for full session, Hospital Bed (LRT wearing face mask)  Reports/displays signs/symptoms of pain?: Yes  Pain Comments : Pt reports 7/10 pain with pt able to received medication by RN during RT eval this AM.  Add'l Session Information: No family/caregiver present, RN aware of RT tx session    No past medical history on file. Social History     Tobacco Use   ??? Smoking status: Former Smoker     Types: Cigarettes   ??? Smokeless tobacco: Not on file   Substance Use Topics   ??? Alcohol use: Not on file      No past surgical history on file. History reviewed. No pertinent family history.     Allergies: Penicillins     Objective    Cognitive  Stage of change / level of insight: Pre-contemplation  Thought Process/Content: Organized  Judgment: Fair  Attention Span/Alertness : Able to attend to RT assessment  Medical understanding: Knows name of diagnosis  and/or medical equipment, Would benefit from additional dx/tx education'  Orientation: Oriented to person, Oriented to time, Oriented to situation, Oriented to place    Communication  Communication Barriers: None noted    Emotional  Mood: Fair  Affect: Congruent  Pain Management : Reports pain that is situational  Frustration Tolerance: Reports frustration that is situational, Manages  frustration with cues/support  Coping Skills : Requires resources/assistance to utilize coping strategies  Motivated to learn new coping strategies?: Yes  Adjustment: Willing to learn ways to compensate, Requires cues to utilize  Body Image/Self concept: Influenced by situational triggers  Emotional Expression: Expresses feelings with cues/resources, Independently expresses feelings  Additional Emotional Domain comments: Pt appears in fair mood and motivated to participate as adjustment with his CLOF and physical deficits that limit his activity/mobility    Physical Domain  Vision: No visual deficits  Mobility Comments: Per PT, pt is able to ambulate 20-40' using RW with Min A  Upper extremity function: LUE weakness/impairment - defer to OT for ADL assist to manage his daily routine  Lower extremity function: defer to PT for LE's strength testing  Equipment/Device needs: wheelchair, rolling walker  Additional Physical Domain comments: Per PT, pt is Min A overall for transfers    Social  Support system: Reports positive support system  Patient Behaviors and Interactions: Appropriate  Ability to form relationship / interact with others: Requires supervision/ cues, Able to form social relationships independently  Additional Social Domain Comments: Pt reports he lives with his dtr/SIL in [place], [place] ([place] area) ; has 2 sons/2 dtrs and 4 grandchildren as his family support in [place]     Leisure and Life Function  Level of involvement: Occasional participation  Firefighter / barrier education: Unable to return to community at this time, Requires min cues to safely problem solve community barriers.  Adaptability for Leisure and life functioning: Requires min cues to investigate adaptations  Motivation to engage in leisure / play: Yes  Quality of participation: Involved in healthy leisure and verbalizes plans to overcome barriers  Additional Leisure and Life Function Comments: Pt reports decreased pursuits in the community as he has been mostly engaged in the home setting; pt enjoys watching movies, listening to music, sitting on the porch - will continue to investigate with pt additional pursuits    I attest that I have reviewed the above information.  Signed by Haskel Khan LRT/CTRS  Filed 05/08/2021

## 2021-05-08 NOTE — Unmapped (Signed)
Physical Medicine and Rehabilitation  Individualized Overall Plan of Care  05/08/2021 10:42 AM     Patient Name: Kevin Cohen   Medical Record Number: [id number]   Date of Birth: 12/26/1950   Sex: Male   Room/Bed: 4H212/4H212-01     Primary Impairment Group:  (Spinal Cord Dysfunction - Non-Traumatic) 81.1914 Quadriplegia, Incomplete C5-8  Admit Date/Time: 05/06/2021  3:27 PM     Physician Summary:   Kevin Cohen will undergo inpatient rehabilitation to manage complex medical and rehabilitation needs related to past medical history of metastatic prostate cancer and known bony metastases s/p chemo+RT admitted for cervical myelopathy with a neurologic level of C5 in the setting of incomplete SCI     Medical Necessity: ??The patient requires acute inpatient rehabilitation to maximize functional independence and requires daily physician visits for monitoring/management of??metastatic prostate cancer, cervical myelopathy with SCI, vital signs, anticoagulation, neurological exam, medications, neurogenic bowel and bladder, skin, spasticity and pain control. This patient's rehabilitation goals and medical complexity could not adequately be managed in a less intensive setting. ??Potential risks for clinical complications include falls, adverse medication reactions, DVT/PE, pressure ulcers, wound dehiscence/infection, aspiration, urinary tract infection, dehydration/malnutrition, respiratory failure, worsening of underlying disease and bleeding.  ??  Medical Prognosis:??Good for continued progress and participation with therapy.  ??  Anticipated Interdisciplinary Rehabilitation Interventions:??Activity tolerance: Patient is expected to tolerate minimum of 3 hrs of therapy daily @ 5x/week. Physical therapy to work on mobility. Occupational therapy to work on self care. Recreational therapy for??community reintegration and relaxation training. Rehab Nursing to work on??medication administration, patient/family education, skin care, wound care, fall prevention, bowel and bladder management, vital signs and feeding/nutrition. Weekly interdisciplinary team conference to assess progress and plan of care changes.  ??  Expected Functional Outcomes:  Expected level of improvement/goals for inpatient rehabilitation??include independent for wheelchair mobility, supervision or some assistance for transfers, supervision or some assistance for ambulation, supervision, setup or some assistance for self care (ADLs) and supervision for safety    Rehab nursing is required to help manage the patient???s complex nursing needs related to their documented medical conditions.     The patient's medical prognosis is Fair to achieve the stated goals below and to be able to be discharged home with family assistance or supervision within the estimated length of stay of 14-21 days.    Patient and Family Goals     ADL: To get back to walking.  Mobility: I want to get back to taking care of myself  Community Reintegration:       Quality Indicators - Goals     Eating Discharge Goal  Discharge Goal: Independent     Oral Hygiene Discharge Goal  Discharge Goal: Independent    Toileting Hygiene Discharge Goal  Discharge Goal: Supervision or touching assistance    Toilet Transfer Discharge Goal  Discharge Goal: Supervision or touching assistance    Shower/Bathe Self Discharge Goal  Discharge Goal: Supervision or touching assistance    Upper Body Dressing Discharge Goal  Discharge Goal: Supervision or touching assistance    Lower Body Dressing Discharge Goal  Discharge Goal: Supervision or touching assistance    Putting On/Taking Off Footwear Discharge Goal  Discharge Goal: Supervision or touching assistance    Roll Left and Right Discharge Goal  Discharge Goal: Independent    Sit to Lying Discharge Goal  Discharge Goal: Independent    Lying to Sitting on Side of Bed Discharge Goal  Discharge Goal: Independent    Sit to Stand  Discharge Goal  Discharge Goal: Independent    Chair/Bed-to-Chair Transfer Discharge Goal  Discharge Goal: Independent    Car Transfer Discharge Goal  Discharge Goal: Partial/moderate assistance    Walk 10 Feet Discharge Goal  Discharge Goal: Supervision or touching assistance    Walk 50 Feet with Two Turns Discharge Goal  Discharge Goal: Supervision or touching assistance    Walk 150 Feet Discharge Goal  Discharge Goal: Supervision or touching assistance    Walking 10 Feet on Uneven Surfaces Discharge Goal  Discharge Goal: Supervision or touching assistance    1 Step (Curb) Discharge Goal  Discharge Goal: Supervision or touching assistance    4 Steps Discharge Goal  Discharge Goal: Supervision or touching assistance    12 Steps Discharge Goal  Discharge Goal: Not applicable    Picking Up Object Discharge Goal  Discharge Goal: Supervision or touching assistance    Wheel 50 Feet with Two Turns Discharge Goal  Discharge Goal: Independent    Wheel 150 Feet Discharge Goal  Discharge Goal: Independent

## 2021-05-09 MED ADMIN — apixaban (ELIQUIS) tablet 5 mg: 5 mg | ORAL | @ 12:00:00

## 2021-05-09 MED ADMIN — oxyCODONE (ROXICODONE) immediate release tablet 10 mg: 10 mg | ORAL | @ 23:00:00 | Stop: 2021-05-20

## 2021-05-09 MED ADMIN — abiraterone (ZYTIGA) tablet 1,000 mg: 1000 mg | ORAL | @ 13:00:00

## 2021-05-09 MED ADMIN — apixaban (ELIQUIS) tablet 5 mg: 5 mg | ORAL | @ 01:00:00

## 2021-05-09 MED ADMIN — DULoxetine (CYMBALTA) DR capsule 30 mg: 30 mg | ORAL | @ 10:00:00 | Stop: 2021-05-09

## 2021-05-09 MED ADMIN — predniSONE (DELTASONE) tablet 20 mg: 20 mg | ORAL | @ 12:00:00 | Stop: 2021-05-12

## 2021-05-09 MED ADMIN — atorvastatin (LIPITOR) tablet 40 mg: 40 mg | ORAL | @ 12:00:00

## 2021-05-09 MED ADMIN — acetaminophen (TYLENOL) tablet 650 mg: 650 mg | ORAL | @ 23:00:00

## 2021-05-09 MED ADMIN — MORPhine (MS CONTIN) 12 hr tablet 15 mg: 15 mg | ORAL | @ 01:00:00 | Stop: 2021-05-10

## 2021-05-09 MED ADMIN — gabapentin (NEURONTIN) capsule 300 mg: 300 mg | ORAL | @ 15:00:00 | Stop: 2021-05-12

## 2021-05-09 MED ADMIN — MORPhine (MS CONTIN) 12 hr tablet 15 mg: 15 mg | ORAL | @ 15:00:00 | Stop: 2021-05-23

## 2021-05-09 MED ADMIN — docusate sodium (COLACE) capsule 100 mg: 100 mg | ORAL | @ 12:00:00

## 2021-05-09 MED ADMIN — lidocaine (LIDODERM) 5 % patch 2 patch: 2 | TRANSDERMAL | @ 12:00:00

## 2021-05-09 MED ADMIN — amLODIPine (NORVASC) tablet 5 mg: 5 mg | ORAL | @ 12:00:00

## 2021-05-09 MED ADMIN — oxyCODONE (ROXICODONE) immediate release tablet 10 mg: 10 mg | ORAL | @ 12:00:00 | Stop: 2021-05-20

## 2021-05-09 NOTE — Unmapped (Signed)
[facility] Clinical Assessment & Refill Coordination Note    Kevin Cohen, DOB: 01-13-1951  Phone: [phone] (home)     All above HIPAA information was verified with patient's caregiver, nurse at [facility].     Was a Nurse, learning disability used for this call? No    Specialty Medication(s):   Hematology/Oncology: abiraterone 250mg      No current facility-administered medications for this visit.     No current outpatient medications on file.     Facility-Administered Medications Ordered in Other Visits   Medication Dose Route Frequency Provider Last Rate Last Admin   ??? abiraterone (ZYTIGA) tablet 1,000 mg  1,000 mg Oral Daily Leonidas Romberg, MD   1,000 mg at 05/08/21 0941   ??? acetaminophen (TYLENOL) tablet 650 mg  650 mg Oral Q6H PRN Leonidas Romberg, MD   650 mg at 05/06/21 2029   ??? albuterol (PROVENTIL HFA;VENTOLIN HFA) 90 mcg/actuation inhaler 2 puff  2 puff Inhalation Q4H PRN Leonidas Romberg, MD       ??? amLODIPine (NORVASC) tablet 5 mg  5 mg Oral Daily Leonidas Romberg, MD   5 mg at 05/09/21 0827   ??? apixaban (ELIQUIS) tablet 5 mg  5 mg Oral BID Leonidas Romberg, MD   5 mg at 05/09/21 0827   ??? atorvastatin (LIPITOR) tablet 40 mg  40 mg Oral Daily Leonidas Romberg, MD   40 mg at 05/09/21 0827   ??? bisacodyL (DULCOLAX) suppository 10 mg  10 mg Rectal Daily PRN Leonidas Romberg, MD       ??? calcium carbonate (TUMS) chewable tablet 200 mg of elem calcium  200 mg of elem calcium Oral TID PRN Leonidas Romberg, MD       ??? docusate sodium (COLACE) capsule 100 mg  100 mg Oral BID Leonidas Romberg, MD   100 mg at 05/09/21 0827   ??? [START ON 05/10/2021] DULoxetine (CYMBALTA) DR capsule 60 mg  60 mg Oral daily Leonidas Romberg, MD       ??? ferrous sulfate tablet 325 mg  325 mg Oral Every other day Leonidas Romberg, MD   325 mg at 05/08/21 0940   ??? gabapentin (NEURONTIN) capsule 300 mg  300 mg Oral Daily Leonidas Romberg, MD   300 mg at 05/08/21 1253   ??? guaiFENesin (ROBITUSSIN) oral syrup  100 mg Oral Q4H PRN Leonidas Romberg, MD       ??? [START ON 05/11/2021] leuprolide (LUPRON) injection 7.5 mg  7.5 mg Intramuscular Q28 Days Modesto Charon, MD       ??? lidocaine (LIDODERM) 5 % patch 2 patch  2 patch Transdermal Daily Leonidas Romberg, MD   2 patch at 05/09/21 [phone]   ??? melatonin tablet 3 mg  3 mg Oral Nightly PRN Leonidas Romberg, MD   3 mg at 05/07/21 2055   ??? MORPhine (MS CONTIN) 12 hr tablet 15 mg  15 mg Oral Q12H Leonidas Romberg, MD   15 mg at 05/08/21 2050   ??? ondansetron (ZOFRAN-ODT) disintegrating tablet 4 mg  4 mg Oral Q6H PRN Leonidas Romberg, MD       ??? oxyCODONE (ROXICODONE) immediate release tablet 5 mg  5 mg Oral Q4H PRN Leonidas Romberg, MD        Or   ??? oxyCODONE (ROXICODONE) immediate release tablet 10 mg  10 mg Oral Q4H PRN Leonidas Romberg, MD  10 mg at 05/09/21 0827   ??? polyethylene glycol (MIRALAX) packet 17 g  17 g Oral Daily PRN Leonidas Romberg, MD       ??? predniSONE (DELTASONE) tablet 20 mg  20 mg Oral Daily Leonidas Romberg, MD   20 mg at 05/09/21 0827    Followed by   ??? [START ON 05/12/2021] predniSONE (DELTASONE) tablet 10 mg  10 mg Oral Daily Leonidas Romberg, MD        Followed by   ??? Melene Muller ON 05/15/2021] predniSONE (DELTASONE) tablet 5 mg  5 mg Oral Daily Leonidas Romberg, MD       ??? senna Mancel Parsons) tablet 2 tablet  2 tablet Oral Daily with lunch Leonidas Romberg, MD   2 tablet at 05/07/21 1153   ??? simethicone (MYLICON) chewable tablet 80 mg  80 mg Oral TID PRN Leonidas Romberg, MD            Changes to medications: Kevin Cohen reports no changes at this time.    Allergies   Allergen Reactions   ??? Penicillins Hives and Other (See Comments)     Hives, welts, swelling profusely . Pretty severe reaction  Has patient had a PCN reaction causing immediate rash, facial/tongue/throat swelling, SOB or lightheadedness with hypotension: No  Has patient had a PCN reaction causing severe rash involving mucus membranes or skin necrosis: Yes  Has patient had a PCN reaction that required hospitalization: Yes  Has patient had a PCN reaction occurring within the last 10 years: No  If all of the above answers are NO, then may proceed with Cephalosporin use.       Changes to allergies: No    SPECIALTY MEDICATION ADHERENCE     abiraterone 250 mg: 4 days of medicine on hand     Medication Adherence    Patient reported X missed doses in the last month: 0  Specialty Medication: abiraterone 250 mg - 4 tabs (1,000 mg) once daily  Patient is on additional specialty medications: No  Informant: caregiver  Confirmed plan for next specialty medication refill: delivery by pharmacy  Refills needed for supportive medications: not needed          Specialty medication(s) dose(s) confirmed: Regimen is correct and unchanged.     Are there any concerns with adherence? No    Adherence counseling provided? Not needed    CLINICAL MANAGEMENT AND INTERVENTION      Clinical Benefit Assessment:    Do you feel the medicine is effective or helping your condition? Yes    Clinical Benefit counseling provided? Not needed    Adverse Effects Assessment:    Are you experiencing any side effects? NA - spoke with nurse at [facility] rehab     Are you experiencing difficulty administering your medicine? No    Quality of Life Assessment:    Quality of Life    Rheumatology  Oncology  1. What impact has your specialty medication had on the reduction of your daily pain or discomfort level?: Tremendous  2. On a scale of 1-10, how would you rate your ability to manage side effects associated with your specialty medication? (1=no issues, 10 = unable to take medication due to side effects): 1  Dermatology  Cystic Fibrosis          How many days over the past month did your prostate cancer  keep you from your normal activities? For example, brushing your teeth or getting up in the morning.  0    Have you discussed this with your provider? Not needed    Acute Infection Status:    Acute infections noted within Epic:  No active infections  Patient reported infection: None    Therapy Appropriateness:    Is therapy appropriate and patient progressing towards therapeutic goals? Yes, therapy is appropriate and should be continued    DISEASE/MEDICATION-SPECIFIC INFORMATION      N/A    PATIENT SPECIFIC NEEDS     - Does the patient have any physical, cognitive, or cultural barriers? No    - Is the patient high risk? Yes, patient is taking oral chemotherapy. Appropriateness of therapy as been assessed    - Does the patient require a Care Management Plan? No     - Does the patient require physician intervention or other additional services (i.e. nutrition, smoking cessation, social work)? No      SHIPPING     Specialty Medication(s) to be Shipped:   Hematology/Oncology: abiraterone 250mg     Other medication(s) to be shipped: prednisone orders at [facility] rehab - getting 5 mg daily while inpatient     Changes to insurance: No    Delivery Scheduled: Yes, Expected medication delivery date: 05/10/21.     Medication will be delivered via [facility] - [facility] to the confirmed temporary address in [place].    The patient will receive a drug information handout for each medication shipped and additional FDA Medication Guides as required.  Verified that patient has previously received a Conservation officer, historic buildings and a Surveyor, mining.    The patient or caregiver noted above participated in the development of this care plan and knows that they can request review of or adjustments to the care plan at any time.      All of the patient's questions and concerns have been addressed.    Breck Coons [facility] Specialty Pharmacist

## 2021-05-09 NOTE — Unmapped (Signed)
Physical Medicine and Rehabilitation  Daily Progress Note [facility]    ASSESSMENT:     Kevin Cohen is a 70 y.o. male ??with a past medical history of metastatic prostate cancer and known bony metastases s/p chemo+RT admitted for cervical myelopathy with a neurologic level of C5 in the setting of incomplete SCI. He is now admitted to [facility] for comprehensive interdisciplinary rehabilitation.   ??  Rehab Impairment Group Code [facility]): (Spinal Cord Dysfunction - Non-Traumatic) [id number] Quadriplegia, Incomplete C5-8  Etiology: Metastatic Prostate Cancer  ??    PLAN:   REHAB:   - PT and OT to maximize functional status with mobility and ADLs as well as prevention of joint contracture.   - RT for community re-integration, education, and leisure support services.  - P&O for assistive devices PRN.  - To be discussed in weekly Interdisciplinary Team Conference.  ??  Non Traumatic, Cervical Spinal Cord Injury: Presented with several month history of symptoms concerning for cervical myelopathy and several week history of worsening LUE weakness. MRI total spine from 9/14 shows diffuse metastatic disease with epidural thickening and spinal canal stenosis from C3-5 without cord signal change, and left foraminal invasion at C5-6. Patient not surgical candidate and placed on steroid taper and radiation treatment as below.  ?? Post-operative Pain:  ? Palliative Care Consult  ? MS Contin 15 mg PO BID  ? Oxycodone 10 mg PO every 4 hours prn pain  ? Duloxetine 30 mg PO daily x 1 week and then increase to 60 mg PO daily. First dose 10/13.  ? Gabapentin to 300 gm PO nightly x 1 week and then stop. First dose 10/13.  ? Tylenol 650 mg PO q4h PRN  ? Lidocaine patch, voltaren gel PRN  ?? Neuropathic Pain  ? CTM  ?? Spasticity: No issues with spasms currently. Will monitor.   ? CTM  ? Bladder:?? Patient endorses sensation and is able to void volitionally.   ? Strict I&O's  ?? Bowel:??Volitional. Admission KUB with mod stool burden.   ? Colace 100 mg BID  ? Senna 2 tabs daily with lunch  ? Bisacodyl Suppository daily PRN  ?? Equipment:  ? Place Bilateral hard PRAFO Boots to protect patient's heels from pressure injury and keep foot in neutral position to avoid PF contractures.  ?? Monitor for Autonomic Dysreflexia: Nitropaste PRN  ?? BMP and CBC every Monday/Thursday  ??  Metastatic Prostate Cancer  Previously on hospice 04/2020. Follows with Dr Philomena Course and started ADT +abi/pred. Metastatic prostate cancer with recent cord compression (T5-T6) and worsening??left arm??weakness, MRI total spine from 9/14 shows diffuse metastatic disease with epidural thickening and spinal canal stenosis from C3-5 without cord signal change, and left foraminal invasion at C5-6. SRN felt patient was not surgical candidate and patient started on radiation therapy and steroid taper.   - Rad/Onc consult  - Radiation treatment Monday-Friday (ends on 10/19)  - Zytiga 1000 mg daily  - Prednisone taper: 30mg  (10/15-10/17); 20mg  (10/18-10/20);10mg  (10/21-10/23) and 5mg  (10/24-)   - Lupron 7.5mg  monthly (next dose on 10/20) until he is able to f/u in clinic for q3 month injection  ??  HTN  - Amlodipine 5 mg daily  ??  HLD  - Lipitor 40 mg daily  ??  Anemia  Chronic inflammatory and iron deficiency  - Ferrous sulfate 350 mg every other day  ??  CKD  Baseline Cr ~1.0-1.2.  -BMP MTh  ??  DVT  Left peroneal and politeal veins dx 04/26/21. IVC  filter placed 10/5 by [facility]IR, needs outpt removal in 3-6 months  - Eliquis 5mg  po bid    DISPO: Patient to be discussed at weekly interdisciplinary team conference.   - EDD: 11/1  - Follow-up: PM&R, PCP, Rad/Onc, Palliative    SUBJECTIVE:     Interval Events: NAEON.   Patient doing well this morning.  States that pain is well controlled at this time.  Palliative to follow-up with patient.  Did well with radiation therapy yesterday.  Remains continent of bowel bladder.  Discussed patient during team conference with goal discharge date set for 11/1.  Patient agreeable to discharge date.    OBJECTIVE:     Vital signs (last 24 hours):  Temp:  [36.6 ??C (97.9 ??F)-36.9 ??C (98.4 ??F)] 36.9 ??C (98.4 ??F)  Heart Rate:  [66-70] 66  Resp:  [18] 18  BP: (108-122)/(67-72) 122/72  MAP (mmHg):  [87] 87  SpO2:  [99 %-100 %] 99 %    Intake/Output (last 3 shifts):  I/O last 3 completed shifts:  In: 500 [P.O.:500]  Out: 500 [Urine:500]    Physical Exam:   GEN: NAD, seen walking with front wheel walker with therapy moving to wheelchair  EYES: sclera anicteric, conjunctiva clear   HEENT: MMM  NECK: trachea midline  CV: warm extremities, well perfused, no cyanosis,   RESP:  NWOB on RA  ABD: Not distended  SKIN: no visible masses, lesions, rashes, ecchymoses.   MSK: no notable contractures or ext swelling  NEURO:Alert, MAE, speech fluid and coherent, responds appropriately to questions   PSYCH: mood euthymic, affect appropriate, thought process logical         Medications:  Scheduled   ??? abiraterone (ZYTIGA) tablet 1,000 mg Daily   ??? amLODIPine (NORVASC) tablet 5 mg Daily   ??? apixaban (ELIQUIS) tablet 5 mg BID   ??? atorvastatin (LIPITOR) tablet 40 mg Daily   ??? docusate sodium (COLACE) capsule 100 mg BID   ??? ferrous sulfate tablet 325 mg Every other day   ??? gabapentin (NEURONTIN) capsule 300 mg Daily   ??? lidocaine (LIDODERM) 5 % patch 2 patch Daily   ??? MORPhine (MS CONTIN) 12 hr tablet 15 mg Q12H   ??? predniSONE (DELTASONE) tablet 20 mg Daily   ??? senna (SENOKOT) tablet 2 tablet Daily with lunch     PRN acetaminophen, 650 mg, Q6H PRN  albuterol, 2 puff, Q4H PRN  bisacodyL, 10 mg, Daily PRN  calcium carbonate, 200 mg of elem calcium, TID PRN  guaiFENesin, 100 mg, Q4H PRN  melatonin, 3 mg, Nightly PRN  ondansetron, 4 mg, Q6H PRN  oxyCODONE, 5 mg, Q4H PRN   Or  oxyCODONE, 10 mg, Q4H PRN  polyethylene glycol, 17 g, Daily PRN  simethicone, 80 mg, TID PRN          Labs/Studies: Reviewed     Radiology Results: no new

## 2021-05-09 NOTE — Unmapped (Signed)
Patient is A&O. Patient is continent of bowel and bladder. Regular diet. Patient complained of pain to left side of arm/shoulder, only wanted to take scheduled medications for pain. Patient went to radiation this afternoon, currently scheduled for last radiation on Wednesday 10/19. Safety and skin protocol maintained. Bed in lowest position, call bell within reach, will continue to monitor.

## 2021-05-09 NOTE — Unmapped (Signed)
Patient is alert and oriented x 4 ,on scheduled pain medication ,continent of B/B ,safety maintained ,bed in lowest position,call bell within reach.    Problem: Rehabilitation (IRF) Plan of Care  Goal: Plan of Care Review  Outcome: Progressing  Goal: Patient-Specific Goal (Individualized)  Outcome: Progressing  Goal: Absence of New-Onset Illness or Injury  Outcome: Progressing  Intervention: Prevent Fall and Fall Injury  Recent Flowsheet Documentation  Taken 05/09/2021 0000 by Orland Dec, RN  Safety Interventions: fall reduction program maintained  Goal: Optimal Comfort and Wellbeing  Outcome: Progressing  Goal: Home and Community Transition Plan Established  Outcome: Progressing     Problem: Fall Injury Risk  Goal: Absence of Fall and Fall-Related Injury  Outcome: Progressing  Intervention: Promote Scientist, clinical (histocompatibility and immunogenetics) Documentation  Taken 05/09/2021 0000 by Orland Dec, RN  Safety Interventions: fall reduction program maintained     Problem: Self-Care Deficit  Goal: Improved Ability to Complete Activities of Daily Living  Outcome: Progressing     Problem: Skin Injury Risk Increased  Goal: Skin Health and Integrity  Outcome: Progressing  Intervention: Optimize Skin Protection  Recent Flowsheet Documentation  Taken 05/09/2021 0000 by Orland Dec, RN  Pressure Reduction Techniques: frequent weight shift encouraged  Pressure Reduction Devices: heel offloading device utilized  Skin Protection: incontinence pads utilized

## 2021-05-09 NOTE — Unmapped (Unsigned)
OUTPATIENT ONCOLOGY PALLIATIVE CARE    This is a new patient appt    Principal Diagnosis: Kevin Cohen is a 70 y.o. male living with ***, diagnosed in ***. Disease sites include ***.     Other significant diagnoses: ***    Assessment/Plan:   #***:     #??Left shoulder and arm pain due to cancer: combination of nociceptive and neuropathic pain. Some improvement during this hospitalization but continues to report relatively high levels of persistent pain but using prns infrequently. No significant pain changes since adjusting regimen 10/12-10/13. Recommend continuing recommendations below for now with hopes of reducing baseline pain with PO regimen to help him be successful at [facility].  - Continue MS Contin 15 mg PO BID  - Continue oxycodone 10 mg PO every 4 hours prn pain  - Continue duloxetine 30 mg PO daily x 1 week and then increase to 60 mg PO daily. First dose 10/13.  - Continue gabapentin to 300 gm PO nightly x 1 week and then stop. First dose 10/13.  - Continue lidocaine patch to Left deltoid  ??  # Prevention of opioid-induced constipation:   - Counseled 10/12 on risk of constipation with opioids and likely need for scheduled stool softeners.   - Still recommend discontinuing colace due to limited effectiveness and mainly increases pill burden  - Continue senna 2 tabs nightly and miralax daily; if stools too frequent or loose would change miralax to as needed.    #Debility***: Functional status (ECOG/PPS)***    #Advanced Care Planning  Information Obtained at: {ACP VISIT ZOXW:96045}    Advance Care Planning     Participants: ***  Discussion/Action: ***    Health Care Decision Maker as of 05/09/2021    HCDM (patient stated preference): Noodwood,Catherine - Daughter - [phone]                 -Goals: ***  -Prognostic awareness: ***  -Code status: ***  -HCPOA: ***  -Natural surrogate decision maker: ***  -Living Will: ***  -ACP note completed on ***  -QTc: ***  -GFR: ***    #Controlled substances risk management.  -Patient {Blank single:19197::has a signed pain medication agreement with Outpatient Palliative Care, completed on ***, as per standard of care,does not have a signed pain medication agreement with our team}.  -[place] database was reviewed today and it {Blank single:19197::was,was not} appropriate.  -Urine drug screen {Blank single:19197::was,was not} performed at this visit. Findings: {Blank single:19197::not applicable,appropriate,inappropriate: ***,periodic screening has been appropriate,***}.  -Patient has received information about safe storage and administration of medications.  -Patient {Blank single:19197::has,has not} received a prescription for narcan; {Blank single:19197::is not applicable,has been educated on its use. Patient's caregiver {Blank single:19197::was,was not} present for this discussion}.       F/u: ***    ----------------------------------------  Referring Provider: ***  Oncology Team: ***  PCP: Abram Sander, MD  Initial palliative care consult: ***     HPI: Kevin Cohen is a 70 y.o. male with metastatic prostate cancer initially diagnosed June 2019 and underwent radiation for 1 year and androgen deprivation therapy until August 2020, then stopped due to side effects.  In October 2020 he was found to have metastatic disease and had 6 cycles of docetaxel.  In October 2021 he elected to not pursue additional cancer directed therapy and enrolled in hospice care.  In May 2022, he developed progressive lower extremity weakness and was found to have diffuse bony mets with spinal cord compression.  This  was treated nonsurgically and with palliative radiation.  He now follows with Dr. Philomena Course who recommended treatment for his rapidly progressive prostate cancer with resuming ADT and starting abiraterone/prednisone.  Patient was agreeable to this.  He was admitted to the hospital at beginning of October 2022     With diffuse bony mets with history of spinal cord compression at T6-T7 in May 2022 treated nonsurgically.      Interval History 05/09/2021  ***    Current cancer-directed therapy: ***    Symptom Review:  General: ***  Pain: ***  Fatigue: ***   Mobility: ***  Sleep: ***  Appetite: ***  Nausea: ***  Bowel function: ***  Dyspnea: ***  Secretions: ***  Mood: ***    Palliative Performance Scale: {Palliative Performance IONGE:95284132}    [facility]  [place] Symptom Assessment System-revised    Please insert the number for each symptom bellow:  Symptoms Severity 0=Best & 10=Worst    Pain {Number:63709:::1}    Tiredness {Number:63709:::1}   Drowsiness {Number:63709:::1}   Nausea {Number:63709:::1}   Lack of Appetite {Number:63709:::1}   Shortness of Breath {Number:63709:::1}   Depression {Number:63709:::1}   Anxiety {Number:63709:::1}   Wellbeing {Number:63709:::1}   Other Problem: *** {Number:63709:::1}     For the following questions please circle the number between 1 and 7 that best applies to you.     How would you rate your overall quality of life during the past week? (1=Very Poor; 7=Excellent)  {Number:63710:::1}       Coping/Support Issues: ***    Goals of Care: ***    Social History: ***  Name of primary support:   Occupation:  Hobbies:  Current residence / distance from [employer]:    Advance Care Planning: ***  HCPOA:  Natural surrogate decision maker:  Living Will:  ACP note:     Objective     Opioid Risk Tool:    Male  Male    Family history of substance abuse      Alcohol  1  3    Illegal drugs  2  3    Rx drugs  4  4    Personal history of substance abuse      Alcohol  3  3    Illegal drugs  4  4    Rx drugs  5  5    Age between 16--45 years  1  1    History of preadolescent sexual abuse  3  0    Psychological disease      ADD, OCD, bipolar, schizophrenia  2  2    Depression  1  1       Total: ***  (<3 low risk, 4-7 moderate risk, >8 high risk)    Oncology History Overview Note   - 06/19: Found to have PSA 72 with PCP. Referred to urology.  - 01/01/18: Received degarelix loading dose with urology  - 02/28/18: Prostate bipsy with pathology showed prostate adenocarcinoma with androgen deprivation treatment effect. Adenocarcinoma is present in all core fragments with involvement of 50% or more of each core. Perineural invasion is present. Gleason grade not reliably assessed due to ADT effect.  - 03/11/18: Staging PET: Enlarged left external iliac node measuring 1.4 cm, [name], [name], faint sclerosis in the   right anterior sixth  rib, neither with significant metabolic activity.  - 03/2018: Underwent definitive XRT, finished 07/2018.  - 04/18/18: PSA 3.8  - 08/22/18: PSA 0.83  - 08/20: Declined additional ADT due to vasomotor symptoms  - 04/14/19: PSA 24  - 04/28/19: CT CAP shows enlarging sclerotic focus along the posteroinferior T6 vertebral body, [name], [name], T6 and T1 vertebral body correspond to sclerotic lesions on comparison CT and consistent with skeletal metastasis.   - 05/13/19: PSA 33  - 05/15/19: Started docetaxel  - 06/04/19: PSA 53  - 06/26/19: PSA 14  - 08/17/19: PSA 7  - 09/07/19: PSA 5.5  - 10/05/19: PSA 6.0. Completed 6 cycles of docetaxel.  - 11/03/19: PSA 4.3  - 01/05/20: PSA 5.5  - 01/06/20: Found on renal US to have posterior bladder wall mass, declined urology referral or additional workup.  - 04/06/20: PSA 11.9  - 09/21: Stefani Dama but stopped after 2-3 weeks 2/2 side effects.  - 05/06/20: PSA 14  - 10/21: Declined further treatment and elected hospice.  - 5/22-31/22: Admitted for T6-T7 cord compression. Started steroids and treated with XRT, finished 12/26/20. Discharged to SNF.  - 12/13/20: PSA 220  - 01/24/21: See by Dr. Cathie Hoops with [facility]. Noted lower body numbness and BLE weakness. Recommended restarting ADT, possible oral chemotherapy. Declined treatment.  - 03/31/21: Seen by Dr. Sandie Ano with [facility]. Ordered MRI spine and CT CAP, [name], [name], [name], indeterminate 0.4 cm left lower lobe groundglass nodule.  - 04/05/21: MRI C/T/L Spine with possible discitis/osteomyelitis C5-T1 with intraosseous abscess and surrounding epidural and paravertebral soft tissue thickening, diffuse epidural and osseous mets, and additional spondylosis of several segments with associated moderate spinal canal narrowing (C3-C5, C7-T1, T3-T4) and severe narrowing C5-C7.     Prostate cancer metastatic to bone (CMS-HCC)   04/05/2021 Initial Diagnosis    Malignant neoplasm of prostate metastatic to bone (CMS-HCC)     04/07/2021 -  Cancer Staged    Staging form: Prostate, AJCC 8th Edition  - Clinical: Stage IVB (cTX, cN1, pM1b) - Signed by Maurie Boettcher, MD on 04/07/2021       04/10/2021 - 04/10/2021 Endocrine/Hormone Therapy    OP DEGARELIX  Plan Provider: Maurie Boettcher, MD     04/27/2021 -  Radiation    Radiation Therapy Treatment Details (Noted on 04/27/2021)  Site: Spine - Cervical  Technique: 3D CRT  Goal: No goal specified  Planned Treatment Start Date: No planned start date specified     05/11/2021 Endocrine/Hormone Therapy    OP LEUPROLIDE (LUPRON) 22.5 MG EVERY 3 MONTHS  Plan Provider: Maurie Boettcher, MD         Patient Active Problem List   Diagnosis   ??? Prostate cancer metastatic to bone (CMS-HCC)   ??? Displacement of lumbar intervertebral disc without myelopathy   ??? Metastatic adenocarcinoma (CMS-HCC)   ??? DVT (deep venous thrombosis) (CMS-HCC)   ??? Anemia   ??? Cervical myelopathy with cervical radiculopathy (CMS-HCC)   ??? HTN (hypertension)   ??? CKD (chronic kidney disease)       No past medical history on file.    No past surgical history on file.  No current facility-administered medications for this visit.     No current outpatient medications on file.     Facility-Administered Medications Ordered in Other Visits   Medication Dose Route Frequency Provider Last Rate Last Admin   ??? abiraterone (ZYTIGA) tablet 1,000 mg  1,000 mg Oral Daily Leonidas Romberg, MD   1,000 mg at 05/09/21 0851   ??? acetaminophen (TYLENOL) tablet 650 mg  650 mg Oral Q6H PRN Leonidas Romberg, MD   650 mg at 05/06/21 2029   ??? albuterol (PROVENTIL HFA;VENTOLIN HFA) 90 mcg/actuation inhaler 2 puff  2 puff Inhalation Q4H PRN Leonidas Romberg, MD       ??? amLODIPine (NORVASC) tablet 5 mg  5 mg Oral Daily Leonidas Romberg, MD   5 mg at 05/09/21 0827   ??? apixaban (ELIQUIS) tablet 5 mg  5 mg Oral BID Leonidas Romberg, MD   5 mg at 05/09/21 0827   ??? atorvastatin (LIPITOR) tablet 40 mg  40 mg Oral Daily Leonidas Romberg, MD   40 mg at 05/09/21 0827   ??? bisacodyL (DULCOLAX) suppository 10 mg  10 mg Rectal Daily PRN Leonidas Romberg, MD       ??? calcium carbonate (TUMS) chewable tablet 200 mg of elem calcium  200 mg of elem calcium Oral TID PRN Leonidas Romberg, MD       ??? docusate sodium (COLACE) capsule 100 mg  100 mg Oral BID Leonidas Romberg, MD   100 mg at 05/09/21 0827   ??? [START ON 05/10/2021] DULoxetine (CYMBALTA) DR capsule 60 mg  60 mg Oral daily Leonidas Romberg, MD       ??? ferrous sulfate tablet 325 mg  325 mg Oral Every other day Leonidas Romberg, MD   325 mg at 05/08/21 0940   ??? gabapentin (NEURONTIN) capsule 300 mg  300 mg Oral Daily Leonidas Romberg, MD   300 mg at 05/09/21 1106   ??? guaiFENesin (ROBITUSSIN) oral syrup  100 mg Oral Q4H PRN Leonidas Romberg, MD       ??? [START ON 05/11/2021] leuprolide (LUPRON) injection 7.5 mg  7.5 mg Intramuscular Q28 Days Modesto Charon, MD       ??? lidocaine (LIDODERM) 5 % patch 2 patch  2 patch Transdermal Daily Leonidas Romberg, MD   2 patch at 05/09/21 [phone]   ??? melatonin tablet 3 mg  3 mg Oral Nightly PRN Leonidas Romberg, MD   3 mg at 05/07/21 2055   ??? MORPhine (MS CONTIN) 12 hr tablet 15 mg  15 mg Oral Q12H Leonidas Romberg, MD   15 mg at 05/09/21 1106   ??? ondansetron (ZOFRAN-ODT) disintegrating tablet 4 mg  4 mg Oral Q6H PRN Leonidas Romberg, MD       ??? oxyCODONE (ROXICODONE) immediate release tablet 5 mg  5 mg Oral Q4H PRN Leonidas Romberg, MD        Or   ??? oxyCODONE (ROXICODONE) immediate release tablet 10 mg  10 mg Oral Q4H PRN Leonidas Romberg, MD   10 mg at 05/09/21 0827   ??? polyethylene glycol (MIRALAX) packet 17 g  17 g Oral Daily PRN Leonidas Romberg, MD       ??? predniSONE (DELTASONE) tablet 20 mg  20 mg Oral Daily Leonidas Romberg, MD   20 mg at 05/09/21 0827    Followed by   ??? [START ON 05/12/2021] predniSONE (DELTASONE) tablet 10 mg  10 mg Oral Daily Leonidas Romberg, MD        Followed by   ??? Melene Muller ON 05/15/2021] predniSONE (DELTASONE) tablet 5 mg  5 mg Oral Daily Leonidas Romberg, MD       ??? senna Mancel Parsons) tablet 2 tablet  2 tablet Oral Daily with lunch Leonidas Romberg, MD   2 tablet at 05/07/21 1153   ??? simethicone (MYLICON) chewable tablet 80 mg  80 mg Oral TID PRN Leonidas Romberg, MD           Allergies:   Allergies   Allergen Reactions   ??? Penicillins Hives and Other (See Comments)     Hives, welts, swelling profusely . Pretty severe reaction  Has patient had a PCN reaction causing immediate rash, facial/tongue/throat swelling, SOB or lightheadedness with hypotension: No  Has patient had a PCN reaction causing severe rash involving mucus membranes or skin necrosis: Yes  Has patient had a PCN reaction that required hospitalization: Yes  Has patient had a PCN reaction occurring within the last 10 years: No  If all of the above answers are NO, then may proceed with Cephalosporin use.       Family History:  Cancer-related family history is not on file.  has no family status information on file.       REVIEW OF SYSTEMS:  {ROS COMPLETE (# of SYSTEMS):[phone]}      PHYSICAL EXAM:   Vital signs for this encounter: VS reviewed in EPIC.  GEN: Awake and alert, pleasant appearing male in no acute distress  PSYCH: Alert and oriented to person, place and time. Euthymic.  HEENT: Pupils equally round without scleral icterus. No facial asymmetry.  CV: Regular rhythm with normal rate, no murmurs.  LUNGS: No increased work of breathing.  ABD: Soft and non-tender with no distention  SKIN: No rashes, petechiae or jaundice noted  EXT: No edema noted of the lower extremities  NEURO: Normal gait and coordination.    Lab Results   Component Value Date    CREATININE 1.03 05/08/2021     Lab Results   Component Value Date    ALKPHOS 732 (H) 05/05/2021    BILITOT 0.4 05/05/2021    PROT 5.4 (L) 05/05/2021    ALBUMIN 2.9 (L) 05/05/2021    ALT <7 (L) 05/05/2021    AST 16 05/05/2021            I personally spent *** minutes face-to-face and non-face-to-face in the care of this patient, which includes all pre, intra, and post visit time on the date of service.     {Blank single:19197::Cynthia Avie Arenas, MD,Julie Merlene Morse, MD PhD,Sean Ike Bene, MD,Jabree Pernice Garner Nash, MD,Vineeta Smith Robert, PharmD CPP,Lauren Baumgardner, DO, HPM Fellow,Clarissa Durand-Rougely, MD, HPM Kerin Ransom, MD, HPM Edilia Bo, MD, HPM Fellow,***}  [facility]    This note was created using Dragon dictation software and while every attempt has been made to minimize errors, some may still be present.

## 2021-05-09 NOTE — Unmapped (Addendum)
Palliative Care Progress Note    Consultation from Requesting Attending Physician:  Sheryle Spray*  Primary Care Provider:  Abram Sander, MD       Code Status: Full Code   Healthcare decision-maker if lacks capacity:   HCDM (patient stated preference): Noodwood,Catherine - Daughter - [phone]     Assessment/Plan:      SUMMARY:  Mr. Kevin WERNTZ is a very pleasant 70 y.o. male with metastatic prostate cancer and widespread bone metastasis throughout spine. He was admitted for worsening pain and upper extremity weakness, complicated by co-morbid acute and chronic conditions including h/o spinal cord compression. He has since transitioned from the main medical center to [facility] for continued care.     Palliative care consulted at main medical center for assistance with symptom evaluation and recommendations. Palliative care requested to continue following as Kevin Cohen has transitioned to rehab. Of note, he has also been followed by [facility] prior to this admission and will continued to be followed as outpatient.    Symptom Assessment and Recommendations:      1. Left shoulder and arm pain due to cancer: STABLE  combination of nociceptive and neuropathic pain. No significant changes in pain, no worsening symptom burden since adjustment of regimen 10/12-10/13.   Taking 10mg  oxy/24 hours PRN.   --Would consider pre-medicating approximately 1 hour prior to PT/OT sessions. Otherwise continue recommendations per below.     - Continue MS Contin 15 mg PO BID. Appreciate attending team to renew order.  Could consider increasing to 15 mg PO q8h sch if pain is limiting PT/OT despite PRN regimen, but this does not currently seem to be an issue.  - Continue oxycodone 10 mg PO every 4 hours prn pain  - Continue duloxetine 30 mg PO daily x 1 week and then increase to 60 mg PO daily. First dose 10/13. Dose set to increase on Wednesday 10/19.  - Continue gabapentin to 300 gm PO nightly x 1 week and then stop. First dose 10/13. Will allow order to expire in 1 day.  - Continue lidocaine patch to left deltoid    He will be followed by the [facility] clinic at discharge. Team is aware and will monitor for his discharge from [facility]. Of note, standing appointment for October 20th with Dr. Wanita Chamberlain will need to be rescheduled as tentative DC date is 11/1.   ??  2. Prevention of opioid-induced constipation:   - LBM 10/18   - Continue senna 2 tabs nightly and miralax daily as needed given frequent stools.     Goals of care / ACP:    Decisional capacity at time of visit:  Able to make decisions for himself.     Current goals of care:  Did not discuss today.      Practical, Emotional, Spiritual Support / Other Communication and Counseling:    Brief visit focused on symptom evaluation and discussion of regimen, but provided compassionate support and active listening. He continues to be satisfied with his family support and the care he is receiving at rehab. He denies any unmet needs at this time. Anticipates last radiation treatment may be tomorrow, 10/19    Please page Palliative Care [phone]) if there are any questions.     Subjective:   Denies pain and states it is overall stable. Continues to passively exercise his left arm and hand. Likes to stay in wheelchair so he can maneuver around his room. Really  enjoys the view outside of his window.    Denies nausea, vomiting, constipation, diarrhea, fever, chills, changes in or worsening pain    Recent Events:  Continues to do well. Tolerating rehab activities and radiation.     No updates to past / social / family history    Scheduled Meds:  ??? abiraterone  1,000 mg Oral Daily   ??? amLODIPine  5 mg Oral Daily   ??? apixaban  5 mg Oral BID   ??? atorvastatin  40 mg Oral Daily   ??? docusate sodium  100 mg Oral BID   ??? [START ON 05/10/2021] DULoxetine  60 mg Oral daily   ??? ferrous sulfate  325 mg Oral Every other day   ??? gabapentin  300 mg Oral Daily   ??? [START ON 05/11/2021] leuprolide  7.5 mg Intramuscular Q28 Days   ??? lidocaine  2 patch Transdermal Daily   ??? MORPhine  15 mg Oral Q12H   ??? predniSONE  20 mg Oral Daily    Followed by   ??? [START ON 05/12/2021] predniSONE  10 mg Oral Daily    Followed by   ??? [START ON 05/15/2021] predniSONE  5 mg Oral Daily   ??? senna  2 tablet Oral Daily with lunch     Continuous Infusions:  PRN Meds:.acetaminophen, albuterol, bisacodyL, calcium carbonate, guaiFENesin, melatonin, ondansetron, oxyCODONE **OR** oxyCODONE, polyethylene glycol, simethicone     Objective:   Seen in room. NAD. Sitting up in wheelchair, watching television. No visitors at bedside.    Function:  50% - Ambulation: Mainly sit/lie / Unable to do any work, extensive disease / Self-Care:Considerable assistance required, Intake: Normal or reduced / Level of Conscious: Full or confusion    Temp:  [36.6 ??C (97.9 ??F)-36.9 ??C (98.4 ??F)] 36.9 ??C (98.4 ??F)  Heart Rate:  [66-70] 66  Resp:  [18] 18  BP: (108-122)/(67-72) 122/72  SpO2:  [99 %-100 %] 99 %    Physical Exam:  Constitutional: Sitting in WC. appears comfortable, NAD, extremely pleasant  Eyes: no discharge, grossly normal vision  Pulm: normal work of breathing, no accessory muscle use  MSK: no sarcopenia, some tenderness to palpation on inferior aspect of left deltoid  Skin: no obvious rashes  Neuro: A&Ox4, only able to passively lift left arm but can move L wrist/hand independently  Psych: mood and affect appropriate for situation and congruent    Test Results:  Lab Results   Component Value Date    WBC 5.2 05/08/2021    RBC 2.38 (L) 05/08/2021    HGB 7.5 (L) 05/08/2021    HCT 22.0 (L) 05/08/2021    MCV 92.2 05/08/2021    MCH 31.4 05/08/2021    MCHC 34.1 05/08/2021    RDW 17.9 (H) 05/08/2021    PLT 114 (L) 05/08/2021    MPV 8.4 05/08/2021     Lab Results   Component Value Date    NA 135 05/08/2021    K 3.8 05/08/2021    CL 103 05/08/2021    CO2 22.8 05/08/2021    BUN 25 (H) 05/08/2021    CREATININE 1.03 05/08/2021 GLU 95 05/08/2021    CALCIUM 8.7 05/08/2021    ALBUMIN 2.9 (L) 05/05/2021    PHOS 2.9 05/06/2021      Lab Results   Component Value Date    ALKPHOS 732 (H) 05/05/2021    BILITOT 0.4 05/05/2021    PROT 5.4 (L) 05/05/2021    ALBUMIN 2.9 (L) 05/05/2021  ALT <7 (L) 05/05/2021    AST 16 05/05/2021       I personally spent 20 minutes on the floor or unit in direct patient care. The direct patient care time included face-to-face time with the patient, reviewing the patient's chart, communicating with the family and/or other professionals and coordinating care.     Greater than 50% of this time spent on counseling/coordination of care:  Yes.   See ACP Note from today for additional billable service:  No.    Anell Barr, DNP, ANP-BC  Pager [phone]

## 2021-05-09 NOTE — Unmapped (Signed)
Pt up in chair and up in bathroom with mod assist. Pt had a shower today with therapy and tolerated well. Pt is able to make needs known and has call bell within reach. Pt states pain meds effective and is able to take pills whole and is able to feed self. Pt works with therapy and tolerates full sessions. Pt went for radiation treatment this shift. Pt maintains safety and no s/s of infection noted. Continue current interventions.  Problem: Rehabilitation (IRF) Plan of Care  Goal: Plan of Care Review  Outcome: Progressing  Goal: Patient-Specific Goal (Individualized)  Outcome: Progressing  Goal: Absence of New-Onset Illness or Injury  Outcome: Progressing  Intervention: Prevent Fall and Fall Injury  Recent Flowsheet Documentation  Taken 05/09/2021 1600 by Horace Porteous, RN  Safety Interventions: fall reduction program maintained  Taken 05/09/2021 1400 by Horace Porteous, RN  Safety Interventions: fall reduction program maintained  Taken 05/09/2021 1200 by Horace Porteous, RN  Safety Interventions: fall reduction program maintained  Taken 05/09/2021 1000 by Horace Porteous, RN  Safety Interventions: fall reduction program maintained  Taken 05/09/2021 0800 by Horace Porteous, RN  Safety Interventions: fall reduction program maintained  Goal: Optimal Comfort and Wellbeing  Outcome: Progressing  Goal: Home and Community Transition Plan Established  Outcome: Progressing     Problem: Fall Injury Risk  Goal: Absence of Fall and Fall-Related Injury  Outcome: Progressing  Intervention: Promote Injury-Free Environment  Recent Flowsheet Documentation  Taken 05/09/2021 1600 by Horace Porteous, RN  Safety Interventions: fall reduction program maintained  Taken 05/09/2021 1400 by Horace Porteous, RN  Safety Interventions: fall reduction program maintained  Taken 05/09/2021 1200 by Horace Porteous, RN  Safety Interventions: fall reduction program maintained  Taken 05/09/2021 1000 by Horace Porteous, RN  Safety Interventions: fall reduction program maintained  Taken 05/09/2021 0800 by Horace Porteous, RN  Safety Interventions: fall reduction program maintained     Problem: Self-Care Deficit  Goal: Improved Ability to Complete Activities of Daily Living  Outcome: Progressing     Problem: Skin Injury Risk Increased  Goal: Skin Health and Integrity  Outcome: Progressing  Intervention: Optimize Skin Protection  Recent Flowsheet Documentation  Taken 05/09/2021 1600 by Horace Porteous, RN  Pressure Reduction Techniques: frequent weight shift encouraged  Pressure Reduction Devices: chair cushion utilized  Taken 05/09/2021 1400 by Horace Porteous, RN  Pressure Reduction Techniques: frequent weight shift encouraged  Pressure Reduction Devices: chair cushion utilized  Taken 05/09/2021 1200 by Horace Porteous, RN  Pressure Reduction Techniques: frequent weight shift encouraged  Pressure Reduction Devices: chair cushion utilized  Taken 05/09/2021 1000 by Horace Porteous, RN  Pressure Reduction Techniques: frequent weight shift encouraged  Pressure Reduction Devices: chair cushion utilized  Taken 05/09/2021 0800 by Horace Porteous, RN  Pressure Reduction Techniques: frequent weight shift encouraged  Pressure Reduction Devices: chair cushion utilized

## 2021-05-10 MED ADMIN — abiraterone (ZYTIGA) tablet 1,000 mg **Patient Supplied**: 1000 mg | ORAL | @ 14:00:00

## 2021-05-10 MED ADMIN — DULoxetine (CYMBALTA) DR capsule 60 mg: 60 mg | ORAL | @ 09:00:00

## 2021-05-10 MED ADMIN — amLODIPine (NORVASC) tablet 5 mg: 5 mg | ORAL | @ 12:00:00

## 2021-05-10 MED ADMIN — atorvastatin (LIPITOR) tablet 40 mg: 40 mg | ORAL | @ 12:00:00

## 2021-05-10 MED ADMIN — ferrous sulfate tablet 325 mg: 325 mg | ORAL | @ 12:00:00

## 2021-05-10 MED ADMIN — MORPhine (MS CONTIN) 12 hr tablet 15 mg: 15 mg | ORAL | @ 14:00:00 | Stop: 2021-05-23

## 2021-05-10 MED ADMIN — lidocaine (LIDODERM) 5 % patch 2 patch: 2 | TRANSDERMAL | @ 12:00:00

## 2021-05-10 MED ADMIN — oxyCODONE (ROXICODONE) immediate release tablet 10 mg: 10 mg | ORAL | @ 09:00:00 | Stop: 2021-05-20

## 2021-05-10 MED ADMIN — oxyCODONE (ROXICODONE) immediate release tablet 10 mg: 10 mg | ORAL | @ 22:00:00 | Stop: 2021-05-20

## 2021-05-10 MED ADMIN — gabapentin (NEURONTIN) capsule 300 mg: 300 mg | ORAL | @ 14:00:00 | Stop: 2021-05-12

## 2021-05-10 MED ADMIN — predniSONE (DELTASONE) tablet 20 mg: 20 mg | ORAL | @ 12:00:00 | Stop: 2021-05-12

## 2021-05-10 MED ADMIN — apixaban (ELIQUIS) tablet 5 mg: 5 mg | ORAL | @ 01:00:00

## 2021-05-10 MED ADMIN — apixaban (ELIQUIS) tablet 5 mg: 5 mg | ORAL | @ 12:00:00

## 2021-05-10 MED ADMIN — MORPhine (MS CONTIN) 12 hr tablet 15 mg: 15 mg | ORAL | @ 01:00:00 | Stop: 2021-05-23

## 2021-05-10 MED ADMIN — senna (SENOKOT) tablet 2 tablet: 2 | ORAL | @ 18:00:00

## 2021-05-10 MED ADMIN — oxyCODONE (ROXICODONE) immediate release tablet 10 mg: 10 mg | ORAL | @ 18:00:00 | Stop: 2021-05-20

## 2021-05-10 MED FILL — ABIRATERONE 250 MG TABLET: ORAL | 30 days supply | Qty: 120 | Fill #1

## 2021-05-10 NOTE — Unmapped (Signed)
Radiation Treatment Management Note    VISIT DATE: 05/10/2021    Diagnosis:    No diagnosis found.    ASSESSMENT AND PLAN:   Kevin Cohen 70 y.o. male with metastatic prostate cancer with known osseous mets (s/p multiple rounds of chemotherapy and radiation to prostate and Spine T5-T7). Now with cervical myelopathy and several week history of worsening LUE weakness.  NSG recommended no surgical intervention at this time due to foraminal invasion and extent outside of spine.       CURRENT TREATMENT:   Cumulative dose 3000 cGy/ 3000 cGy  Fraction 10 / 10.    1. Cancer  Radiation treatments complete today.  I have reviewed and approved the appropriate quality assurance and targeting imaging.      2. Toxicity  May be developing a mild esophagitis- not bothering him currently but discussed that this could worsen over the next 1-2 weeks.  If this becomes more bothersome/interferes with PO intake I would recommend BLM and prn pain medications     3. Follow Up:  Treatment complete- rad onc follow-up prn, will continue to follow with medical oncology    Subjective:   Kevin Cohen was transferred to [facility] over the weekend and we resumed his treatments in [facility] on Monday.  He is Doing OK today and still in inpatient rehab.  L arm still with minimal function but no worse than before.  He has noticed a very mild sore throat- not interfering with PO intake at this point.  Otherwise no issues.    PHYSICAL EXAMINATION:  There were no vitals taken for this visit.  Karnofsky/Lansky Performance Status:  70, Cares for self; unable to carry on normal activity or to do active work (ECOG equivalent 1)  General/Constitutional: Well-appearing, NAD   HEENT: Normocephalic, atraumatic, no scleral icterus   Skin: No suspicious lesions or rashes  Pulmonary: No respiratory distress or increased work of breathing   Abdominal: Non distended  Musculoskeletal: Full range of motion in the extremities, without edema   Neurologic: Alert and oriented to conversation.  Cranial nerves III through XII intact.  Left shoulder flexion, abduction /left elbow flexion/left grip strength all 2 out of 5.  Decreased sensation in distal left hand.   Right hand demonstrates 4 out of 5 hand grip strength.  Otherwise strength and sensation normal throughout.  Psychiatric: Appropriate affect and judgement      Rayetta Humphrey, MD  Assistant Professor  [facility] Dept of Radiation Oncology  05/11/2021

## 2021-05-10 NOTE — Unmapped (Signed)
Patient is alert and oriented x 4. Regular diet. Takes medicine whole Continent of bowel and bladder, LBM 10/16. L-sided weakness. No complaint of pain. Right forearm IV saline locked. Skin is intact. On chemo precaution. Skin and fall protocol maintained. Bed in lowest position, wheels locked. Call bell and phone within reach. No concerns at this time. Hourly rounding ongoing. Will continue to monitor.    Problem: Rehabilitation (IRF) Plan of Care  Goal: Plan of Care Review  Outcome: Progressing  Goal: Patient-Specific Goal (Individualized)  Outcome: Progressing  Goal: Absence of New-Onset Illness or Injury  Outcome: Progressing  Intervention: Prevent Fall and Fall Injury  Recent Flowsheet Documentation  Taken 05/10/2021 0000 by Gwyneth Sprout, RN  Safety Interventions: fall reduction program maintained  Taken 05/09/2021 2200 by Gwyneth Sprout, RN  Safety Interventions: fall reduction program maintained  Taken 05/09/2021 2000 by Gwyneth Sprout, RN  Safety Interventions: fall reduction program maintained  Intervention: Prevent VTE (Venous Thromboembolism)  Recent Flowsheet Documentation  Taken 05/09/2021 2117 by Gwyneth Sprout, RN  VTE Prevention/Management: anticoagulant therapy  Goal: Optimal Comfort and Wellbeing  Outcome: Progressing

## 2021-05-10 NOTE — Unmapped (Signed)
Patient is alert and oriented x4. Continent of bladder and bowel. Pain is well controlled on current regimen. PRN oxycodone given for neck pain along with scheduled meds.No new skin issues noted. Safety and fall precautions in place. Call bell and belongings within reach. No falls or injuries, this shift. No changes to the plan of care at this time.   Problem: Rehabilitation (IRF) Plan of Care  Goal: Plan of Care Review  Outcome: Progressing  Goal: Patient-Specific Goal (Individualized)  Outcome: Progressing  Goal: Absence of New-Onset Illness or Injury  Outcome: Progressing  Intervention: Prevent Fall and Fall Injury  Recent Flowsheet Documentation  Taken 05/10/2021 1200 by Wyvonna Plum, RN  Safety Interventions: fall reduction program maintained  Taken 05/10/2021 1000 by Wyvonna Plum, RN  Safety Interventions: fall reduction program maintained  Goal: Optimal Comfort and Wellbeing  Outcome: Progressing  Goal: Home and Community Transition Plan Established  Outcome: Progressing     Problem: Fall Injury Risk  Goal: Absence of Fall and Fall-Related Injury  Outcome: Progressing  Intervention: Promote Injury-Free Environment  Recent Flowsheet Documentation  Taken 05/10/2021 1200 by Wyvonna Plum, RN  Safety Interventions: fall reduction program maintained  Taken 05/10/2021 1000 by Wyvonna Plum, RN  Safety Interventions: fall reduction program maintained     Problem: Self-Care Deficit  Goal: Improved Ability to Complete Activities of Daily Living  Outcome: Progressing     Problem: Skin Injury Risk Increased  Goal: Skin Health and Integrity  Outcome: Progressing  Intervention: Optimize Skin Protection  Recent Flowsheet Documentation  Taken 05/10/2021 1200 by Wyvonna Plum, RN  Pressure Reduction Techniques: frequent weight shift encouraged  Taken 05/10/2021 1000 by Wyvonna Plum, RN  Pressure Reduction Techniques: frequent weight shift encouraged     Problem: Hypertension Comorbidity  Goal: Blood Pressure in Desired Range  Outcome: Progressing

## 2021-05-10 NOTE — Unmapped (Signed)
Physical Medicine and Rehabilitation  Daily Progress Note [facility]    ASSESSMENT:     Kevin Cohen is a 70 y.o. male ??with a past medical history of metastatic prostate cancer and known bony metastases s/p chemo+RT admitted for cervical myelopathy with a neurologic level of C5 in the setting of incomplete SCI. He is now admitted to [facility] for comprehensive interdisciplinary rehabilitation.   ??  Rehab Impairment Group Code [facility]): (Spinal Cord Dysfunction - Non-Traumatic) [id number] Quadriplegia, Incomplete C5-8  Etiology: Metastatic Prostate Cancer  ??    PLAN:   REHAB:   - PT and OT to maximize functional status with mobility and ADLs as well as prevention of joint contracture.   - RT for community re-integration, education, and leisure support services.  - P&O for assistive devices PRN.  - To be discussed in weekly Interdisciplinary Team Conference.  ??  Non Traumatic, Cervical Spinal Cord Injury: Presented with several month history of symptoms concerning for cervical myelopathy and several week history of worsening LUE weakness. MRI total spine from 9/14 shows diffuse metastatic disease with epidural thickening and spinal canal stenosis from C3-5 without cord signal change, and left foraminal invasion at C5-6. Patient not surgical candidate and placed on steroid taper and radiation treatment as below.  ?? Post-operative Pain:  ? Palliative Care Consult  ? MS Contin 15 mg PO BID  ? Oxycodone 10 mg PO every 4 hours prn pain  ? Duloxetine 30 mg PO daily x 1 week and then increase to 60 mg PO daily. First dose 10/13.  ? Gabapentin to 300 gm PO nightly x 1 week and then stop. First dose 10/13.  ? Tylenol 650 mg PO q4h PRN  ? Lidocaine patch, voltaren gel PRN  ?? Neuropathic Pain  ? CTM  ?? Spasticity: No issues with spasms currently. Will monitor.   ? CTM  ? Bladder:?? Patient endorses sensation and is able to void volitionally.   ? Strict I&O's  ?? Bowel:??Volitional. Admission KUB with mod stool burden.   ? Colace 100 mg BID  ? Senna 2 tabs daily with lunch  ? Bisacodyl Suppository daily PRN  ?? Equipment:  ? Place Bilateral hard PRAFO Boots to protect patient's heels from pressure injury and keep foot in neutral position to avoid PF contractures.  ?? Monitor for Autonomic Dysreflexia: Nitropaste PRN  ?? BMP and CBC every Monday/Thursday  ??  Metastatic Prostate Cancer  Previously on hospice 04/2020. Follows with Dr Philomena Course and started ADT +abi/pred. Metastatic prostate cancer with recent cord compression (T5-T6) and worsening??left arm??weakness, MRI total spine from 9/14 shows diffuse metastatic disease with epidural thickening and spinal canal stenosis from C3-5 without cord signal change, and left foraminal invasion at C5-6. SRN felt patient was not surgical candidate and patient started on radiation therapy and steroid taper.   - Rad/Onc consult  - Radiation treatment Monday-Friday (ends on 10/19)  - Zytiga 1000 mg daily  - Prednisone taper: 30mg  (10/15-10/17); 20mg  (10/18-10/20);10mg  (10/21-10/23) and 5mg  (10/24-)   - Lupron 7.5mg  monthly (next dose on 10/20) until he is able to f/u in clinic for q3 month injection  ??  HTN  - Amlodipine 5 mg daily  ??  HLD  - Lipitor 40 mg daily  ??  Anemia  Chronic inflammatory and iron deficiency  - Ferrous sulfate 350 mg every other day  ??  CKD  Baseline Cr ~1.0-1.2.  -BMP MTh  ??  DVT  Left peroneal and politeal veins dx 04/26/21. IVC  filter placed 10/5 by VIR, needs outpt removal in 3-6 months  - Eliquis 5mg  po bid    DISPO: Patient to be discussed at weekly interdisciplinary team conference.   - EDD: 11/1  - Follow-up: PM&R, PCP, Rad/Onc, Palliative    SUBJECTIVE:     Interval Events: NAEON. Patient doing well this morning. Completed radiation treatment this morning and notes some neck discomfort afterwards but is otherwise doing well. States that pain has been well controlled. Scheduled to receive lupron dose tomorrow.     OBJECTIVE:     Vital signs (last 24 hours):  Temp:  [36.2 ??C (97.2 ??F)-36.9 ??C (98.4 ??F)] 36.9 ??C (98.4 ??F)  Heart Rate:  [64-70] 70  Resp:  [16-17] 17  BP: (106-122)/(61-67) 122/67  MAP (mmHg):  [75-84] 84  SpO2:  [98 %-99 %] 99 %    Intake/Output (last 3 shifts):  I/O last 3 completed shifts:  In: -   Out: 200 [Urine:200]    Physical Exam:   GEN: NAD, sitting in wheelchair  EYES: sclera anicteric, conjunctiva clear   HEENT: MMM  NECK: trachea midline  CV: warm extremities, well perfused, no cyanosis,   RESP:  NWOB on RA  ABD: Not distended  SKIN: no visible masses, lesions, rashes, ecchymoses.   MSK: no notable contractures or ext swelling  NEURO:Alert, MAE, speech fluid and coherent, responds appropriately to questions   PSYCH: mood euthymic, affect appropriate, thought process logical         Medications:  Scheduled   ??? abiraterone (ZYTIGA) tablet 1,000 mg Daily   ??? amLODIPine (NORVASC) tablet 5 mg Daily   ??? apixaban (ELIQUIS) tablet 5 mg BID   ??? atorvastatin (LIPITOR) tablet 40 mg Daily   ??? docusate sodium (COLACE) capsule 100 mg BID   ??? DULoxetine (CYMBALTA) DR capsule 60 mg daily   ??? ferrous sulfate tablet 325 mg Every other day   ??? gabapentin (NEURONTIN) capsule 300 mg Daily   ??? lidocaine (LIDODERM) 5 % patch 2 patch Daily   ??? MORPhine (MS CONTIN) 12 hr tablet 15 mg Q12H   ??? predniSONE (DELTASONE) tablet 20 mg Daily   ??? senna (SENOKOT) tablet 2 tablet Daily with lunch     PRN acetaminophen, 650 mg, Q6H PRN  albuterol, 2 puff, Q4H PRN  bisacodyL, 10 mg, Daily PRN  calcium carbonate, 200 mg of elem calcium, TID PRN  guaiFENesin, 100 mg, Q4H PRN  melatonin, 3 mg, Nightly PRN  ondansetron, 4 mg, Q6H PRN  oxyCODONE, 5 mg, Q4H PRN   Or  oxyCODONE, 10 mg, Q4H PRN  polyethylene glycol, 17 g, Daily PRN  simethicone, 80 mg, TID PRN          Labs/Studies: Reviewed     Radiology Results: no new

## 2021-05-10 NOTE — Unmapped (Signed)
05/10/2021    Dose Site Summary:  Rx:C4-T2 Spin: 05/10/2021: 3,000/3,000 cGy  Subjective/Assessment/Recommendations:

## 2021-05-11 LAB — CBC
HEMATOCRIT: 22.9 % — ABNORMAL LOW (ref 39.0–48.0)
HEMOGLOBIN: 7.5 g/dL — ABNORMAL LOW (ref 12.9–16.5)
MEAN CORPUSCULAR HEMOGLOBIN CONC: 32.8 g/dL (ref 32.0–36.0)
MEAN CORPUSCULAR HEMOGLOBIN: 31 pg (ref 25.9–32.4)
MEAN CORPUSCULAR VOLUME: 94.4 fL (ref 77.6–95.7)
MEAN PLATELET VOLUME: 7.7 fL (ref 6.8–10.7)
PLATELET COUNT: 103 10*9/L — ABNORMAL LOW (ref 150–450)
RED BLOOD CELL COUNT: 2.42 10*12/L — ABNORMAL LOW (ref 4.26–5.60)
RED CELL DISTRIBUTION WIDTH: 18.4 % — ABNORMAL HIGH (ref 12.2–15.2)
WBC ADJUSTED: 4.9 10*9/L (ref 3.6–11.2)

## 2021-05-11 LAB — BASIC METABOLIC PANEL
ANION GAP: 6 mmol/L (ref 5–14)
BLOOD UREA NITROGEN: 22 mg/dL (ref 9–23)
BUN / CREAT RATIO: 22
CALCIUM: 8.9 mg/dL (ref 8.7–10.4)
CHLORIDE: 103 mmol/L (ref 98–107)
CO2: 26.6 mmol/L (ref 20.0–31.0)
CREATININE: 0.99 mg/dL
EGFR CKD-EPI (2021) MALE: 83 mL/min/{1.73_m2} (ref >=60)
GLUCOSE RANDOM: 97 mg/dL (ref 70–179)
POTASSIUM: 3.8 mmol/L (ref 3.4–4.8)
SODIUM: 136 mmol/L (ref 135–145)

## 2021-05-11 MED ADMIN — guaiFENesin (ROBITUSSIN) oral syrup: 100 mg | ORAL | @ 09:00:00

## 2021-05-11 MED ADMIN — oxyCODONE (ROXICODONE) immediate release tablet 10 mg: 10 mg | ORAL | @ 19:00:00 | Stop: 2021-05-20

## 2021-05-11 MED ADMIN — acetaminophen (TYLENOL) tablet 650 mg: 650 mg | ORAL | @ 12:00:00

## 2021-05-11 MED ADMIN — lidocaine (LIDODERM) 5 % patch 2 patch: 2 | TRANSDERMAL | @ 12:00:00

## 2021-05-11 MED ADMIN — amLODIPine (NORVASC) tablet 5 mg: 5 mg | ORAL | @ 12:00:00

## 2021-05-11 MED ADMIN — predniSONE (DELTASONE) tablet 20 mg: 20 mg | ORAL | @ 12:00:00 | Stop: 2021-05-11

## 2021-05-11 MED ADMIN — atorvastatin (LIPITOR) tablet 40 mg: 40 mg | ORAL | @ 12:00:00

## 2021-05-11 MED ADMIN — MORPhine (MS CONTIN) 12 hr tablet 15 mg: 15 mg | ORAL | @ 15:00:00 | Stop: 2021-05-23

## 2021-05-11 MED ADMIN — oxyCODONE (ROXICODONE) immediate release tablet 10 mg: 10 mg | ORAL | @ 09:00:00 | Stop: 2021-05-20

## 2021-05-11 MED ADMIN — apixaban (ELIQUIS) tablet 5 mg: 5 mg | ORAL | @ 12:00:00

## 2021-05-11 MED ADMIN — gabapentin (NEURONTIN) capsule 300 mg: 300 mg | ORAL | @ 15:00:00 | Stop: 2021-05-11

## 2021-05-11 MED ADMIN — abiraterone (ZYTIGA) tablet 1,000 mg **Patient Supplied**: 1000 mg | ORAL | @ 14:00:00

## 2021-05-11 MED ADMIN — magic mouthwash oral suspension 10 mL: 10 mL | ORAL | @ 14:00:00 | Stop: 2021-05-11

## 2021-05-11 MED ADMIN — senna (SENOKOT) tablet 2 tablet: 2 | ORAL | @ 15:00:00

## 2021-05-11 MED ADMIN — leuprolide (LUPRON) injection 7.5 mg: 7.5 mg | INTRAMUSCULAR | @ 15:00:00

## 2021-05-11 MED ADMIN — MORPhine (MS CONTIN) 12 hr tablet 15 mg: 15 mg | ORAL | @ 01:00:00 | Stop: 2021-05-23

## 2021-05-11 MED ADMIN — DULoxetine (CYMBALTA) DR capsule 60 mg: 60 mg | ORAL | @ 09:00:00

## 2021-05-11 MED ADMIN — apixaban (ELIQUIS) tablet 5 mg: 5 mg | ORAL | @ 01:00:00

## 2021-05-11 NOTE — Unmapped (Signed)
Physical Medicine and Rehabilitation  Daily Progress Note [facility]    ASSESSMENT:     Kevin Cohen is a 70 y.o. male ??with a past medical history of metastatic prostate cancer and known bony metastases s/p chemo+RT admitted for cervical myelopathy with a neurologic level of C5 in the setting of incomplete SCI. He is now admitted to [facility] for comprehensive interdisciplinary rehabilitation.   ??  Rehab Impairment Group Code [facility]): (Spinal Cord Dysfunction - Non-Traumatic) [id number] Quadriplegia, Incomplete C5-8  Etiology: Metastatic Prostate Cancer  ??    PLAN:   REHAB:   - PT and OT to maximize functional status with mobility and ADLs as well as prevention of joint contracture.   - RT for community re-integration, education, and leisure support services.  - P&O for assistive devices PRN.  - To be discussed in weekly Interdisciplinary Team Conference.  ??  Non Traumatic, Cervical Spinal Cord Injury: Presented with several month history of symptoms concerning for cervical myelopathy and several week history of worsening LUE weakness. MRI total spine from 9/14 shows diffuse metastatic disease with epidural thickening and spinal canal stenosis from C3-5 without cord signal change, and left foraminal invasion at C5-6. Patient not surgical candidate and placed on steroid taper and radiation treatment as below.  ?? Post-operative Pain:  ? Palliative Care Consult  ? MS Contin 15 mg PO BID  ? Oxycodone 10 mg PO every 4 hours prn pain  ? Duloxetine 30 mg PO daily x 1 week and then increase to 60 mg PO daily. First dose 10/13.  ? Gabapentin to 300 gm PO nightly x 1 week and then stop. First dose 10/13.  ? Tylenol 650 mg PO q4h PRN  ? Lidocaine patch, voltaren gel PRN  ?? Neuropathic Pain  ? CTM  ?? Spasticity: No issues with spasms currently. Will monitor.   ? CTM  ? Bladder:?? Patient endorses sensation and is able to void volitionally.   ? Strict I&O's  ?? Bowel:??Volitional. Admission KUB with mod stool burden.   ? Colace 100 mg BID  ? Senna 2 tabs daily with lunch  ? Bisacodyl Suppository daily PRN  ?? Equipment:  ? Place Bilateral hard PRAFO Boots to protect patient's heels from pressure injury and keep foot in neutral position to avoid PF contractures.  ?? Monitor for Autonomic Dysreflexia: Nitropaste PRN  ?? BMP and CBC every Monday/Thursday  ??  Metastatic Prostate Cancer  Previously on hospice 04/2020. Follows with Dr Philomena Course and started ADT +abi/pred. Metastatic prostate cancer with recent cord compression (T5-T6) and worsening??left arm??weakness, MRI total spine from 9/14 shows diffuse metastatic disease with epidural thickening and spinal canal stenosis from C3-5 without cord signal change, and left foraminal invasion at C5-6. SRN felt patient was not surgical candidate and patient started on radiation therapy and steroid taper.   - Rad/Onc consult  - Radiation treatment Monday-Friday (ends on 10/19)  - Zytiga 1000 mg daily  - Prednisone taper: 30mg  (10/15-10/17); 20mg  (10/18-10/20);10mg  (10/21-10/23) and 5mg  (10/24-)   - Lupron 7.5mg  monthly (next dose on 10/20) until he is able to f/u in clinic for q3 month injection  ??  HTN  - Amlodipine 5 mg daily  ??  HLD  - Lipitor 40 mg daily  ??  Anemia  Chronic inflammatory and iron deficiency  - Ferrous sulfate 350 mg every other day  ??  CKD  Baseline Cr ~1.0-1.2.  -BMP MTh  ??  DVT  Left peroneal and politeal veins dx 04/26/21. IVC  filter placed 10/5 by VIR, needs outpt removal in 3-6 months  - Eliquis 5mg  po bid    DISPO: Patient to be discussed at weekly interdisciplinary team conference.   - EDD: 11/1  - Follow-up: PM&R, PCP, Rad/Onc, Palliative    SUBJECTIVE:     Interval Events: NAEON. Patient doing well this morning. Slept well and denies any pain issues. Seen working with therapy this morning walking using a hemiwalker with great success.     OBJECTIVE:     Vital signs (last 24 hours):  Temp:  [36.7 ??C (98.1 ??F)] 36.7 ??C (98.1 ??F)  Heart Rate:  [66-70] 70  Resp:  [17-18] 17  BP: (106-124)/(61-69) 106/69  MAP (mmHg):  [82] 82  SpO2:  [98 %-100 %] 98 %    Intake/Output (last 3 shifts):  I/O last 3 completed shifts:  In: -   Out: 850 [Urine:850]    Physical Exam:   GEN: NAD,walking with therapy using a hemiwalker  EYES: sclera anicteric, conjunctiva clear   HEENT: MMM  NECK: trachea midline  CV: warm extremities, well perfused, no cyanosis,   RESP:  NWOB on RA  ABD: Not distended  SKIN: no visible masses, lesions, rashes, ecchymoses.   MSK: no notable contractures or ext swelling  NEURO:Alert, MAE, speech fluid and coherent, responds appropriately to questions   PSYCH: mood euthymic, affect appropriate, thought process logical         Medications:  Scheduled   ??? abiraterone (ZYTIGA) tablet 1,000 mg **Patient Supplied** Daily   ??? amLODIPine (NORVASC) tablet 5 mg Daily   ??? apixaban (ELIQUIS) tablet 5 mg BID   ??? atorvastatin (LIPITOR) tablet 40 mg Daily   ??? docusate sodium (COLACE) capsule 100 mg BID   ??? DULoxetine (CYMBALTA) DR capsule 60 mg daily   ??? ferrous sulfate tablet 325 mg Every other day   ??? gabapentin (NEURONTIN) capsule 300 mg Daily   ??? leuprolide (LUPRON) injection 7.5 mg Q28 Days   ??? lidocaine (LIDODERM) 5 % patch 2 patch Daily   ??? MORPhine (MS CONTIN) 12 hr tablet 15 mg Q12H   ??? senna (SENOKOT) tablet 2 tablet Daily with lunch     PRN acetaminophen, 650 mg, Q6H PRN  albuterol, 2 puff, Q4H PRN  bisacodyL, 10 mg, Daily PRN  calcium carbonate, 200 mg of elem calcium, TID PRN  guaiFENesin, 100 mg, Q4H PRN  magic mouthwash oral, 10 mL, QID PRN  melatonin, 3 mg, Nightly PRN  ondansetron, 4 mg, Q6H PRN  oxyCODONE, 5 mg, Q4H PRN   Or  oxyCODONE, 10 mg, Q4H PRN  polyethylene glycol, 17 g, Daily PRN  simethicone, 80 mg, TID PRN          Labs/Studies: Reviewed     Radiology Results: no new

## 2021-05-11 NOTE — Unmapped (Signed)
Patient is alert and oriented x4. Continent of bladder and bowel. Pain is controlled on current medication regimen. Pt c/o sore throat s/p radiation. PRNS ordered with some relief. No new skin issues noted. Safety and fall precautions in place. Call bell and belongings within reach. No falls or injuries, this shift. No changes to the plan of care at this time.   Problem: Rehabilitation (IRF) Plan of Care  Goal: Plan of Care Review  Outcome: Progressing  Goal: Patient-Specific Goal (Individualized)  Outcome: Progressing  Goal: Absence of New-Onset Illness or Injury  Outcome: Progressing  Intervention: Prevent Fall and Fall Injury  Recent Flowsheet Documentation  Taken 05/11/2021 1400 by Wyvonna Plum, RN  Safety Interventions: fall reduction program maintained  Taken 05/11/2021 1200 by Wyvonna Plum, RN  Safety Interventions: fall reduction program maintained  Taken 05/11/2021 0800 by Wyvonna Plum, RN  Safety Interventions: fall reduction program maintained  Goal: Optimal Comfort and Wellbeing  Outcome: Progressing  Goal: Home and Community Transition Plan Established  Outcome: Progressing     Problem: Fall Injury Risk  Goal: Absence of Fall and Fall-Related Injury  Outcome: Progressing  Intervention: Promote Injury-Free Environment  Recent Flowsheet Documentation  Taken 05/11/2021 1400 by Wyvonna Plum, RN  Safety Interventions: fall reduction program maintained  Taken 05/11/2021 1200 by Wyvonna Plum, RN  Safety Interventions: fall reduction program maintained  Taken 05/11/2021 0800 by Wyvonna Plum, RN  Safety Interventions: fall reduction program maintained     Problem: Self-Care Deficit  Goal: Improved Ability to Complete Activities of Daily Living  Outcome: Progressing     Problem: Skin Injury Risk Increased  Goal: Skin Health and Integrity  Outcome: Progressing  Intervention: Optimize Skin Protection  Recent Flowsheet Documentation  Taken 05/11/2021 1400 by Wyvonna Plum, RN  Pressure Reduction Techniques: frequent weight shift encouraged  Taken 05/11/2021 1200 by Wyvonna Plum, RN  Pressure Reduction Techniques: frequent weight shift encouraged  Taken 05/11/2021 0800 by Wyvonna Plum, RN  Pressure Reduction Techniques: frequent weight shift encouraged     Problem: Hypertension Comorbidity  Goal: Blood Pressure in Desired Range  Outcome: Progressing

## 2021-05-11 NOTE — Unmapped (Signed)
Patient is alert and oriented x 4. Regular diet. Takes medicine whole. Continent of bowel and bladder. L-sided weakness. No complaint of pain. Right forearm PIV saline locked, with dressing C/D/I. Skin is intact. On chemo precaution. Skin and fall protocol maintained. Bed in lowest position, wheels locked. Call bell and phone within reach. No concerns at this time. Hourly rounding ongoing. Will continue to monitor.     Problem: Rehabilitation (IRF) Plan of Care  Goal: Plan of Care Review  Outcome: Progressing  Goal: Patient-Specific Goal (Individualized)  Outcome: Progressing  Goal: Absence of New-Onset Illness or Injury  Outcome: Progressing  Intervention: Prevent Fall and Fall Injury  Recent Flowsheet Documentation  Taken 05/11/2021 0000 by Gwyneth Sprout, RN  Safety Interventions: fall reduction program maintained  Taken 05/10/2021 2200 by Gwyneth Sprout, RN  Safety Interventions: fall reduction program maintained  Taken 05/10/2021 2000 by Gwyneth Sprout, RN  Safety Interventions: fall reduction program maintained  Intervention: Prevent VTE (Venous Thromboembolism)  Recent Flowsheet Documentation  Taken 05/10/2021 2123 by Gwyneth Sprout, RN  VTE Prevention/Management: anticoagulant therapy  Goal: Optimal Comfort and Wellbeing  Outcome: Progressing

## 2021-05-12 MED ADMIN — predniSONE (DELTASONE) tablet 10 mg: 10 mg | ORAL | @ 14:00:00 | Stop: 2021-05-15

## 2021-05-12 MED ADMIN — lidocaine (LIDODERM) 5 % patch 2 patch: 2 | TRANSDERMAL | @ 12:00:00

## 2021-05-12 MED ADMIN — ferrous sulfate tablet 325 mg: 325 mg | ORAL | @ 12:00:00

## 2021-05-12 MED ADMIN — docusate sodium (COLACE) capsule 100 mg: 100 mg | ORAL | @ 12:00:00

## 2021-05-12 MED ADMIN — apixaban (ELIQUIS) tablet 5 mg: 5 mg | ORAL | @ 12:00:00

## 2021-05-12 MED ADMIN — oxyCODONE (ROXICODONE) immediate release tablet 10 mg: 10 mg | ORAL | @ 10:00:00 | Stop: 2021-05-20

## 2021-05-12 MED ADMIN — abiraterone (ZYTIGA) tablet 1,000 mg **Patient Supplied**: 1000 mg | ORAL | @ 14:00:00

## 2021-05-12 MED ADMIN — DULoxetine (CYMBALTA) DR capsule 60 mg: 60 mg | ORAL | @ 10:00:00

## 2021-05-12 MED ADMIN — docusate sodium (COLACE) capsule 100 mg: 100 mg | ORAL | @ 02:00:00

## 2021-05-12 MED ADMIN — amLODIPine (NORVASC) tablet 5 mg: 5 mg | ORAL | @ 12:00:00

## 2021-05-12 MED ADMIN — acetaminophen (TYLENOL) tablet 650 mg: 650 mg | ORAL | @ 10:00:00

## 2021-05-12 MED ADMIN — oxyCODONE (ROXICODONE) immediate release tablet 10 mg: 10 mg | ORAL | @ 21:00:00 | Stop: 2021-05-20

## 2021-05-12 MED ADMIN — atorvastatin (LIPITOR) tablet 40 mg: 40 mg | ORAL | @ 12:00:00

## 2021-05-12 MED ADMIN — MORPhine (MS CONTIN) 12 hr tablet 15 mg: 15 mg | ORAL | @ 14:00:00 | Stop: 2021-05-23

## 2021-05-12 MED ADMIN — mucositis mixture (with lidocaine): 10 mL | OROMUCOSAL | @ 04:00:00

## 2021-05-12 MED ADMIN — apixaban (ELIQUIS) tablet 5 mg: 5 mg | ORAL | @ 02:00:00

## 2021-05-12 MED ADMIN — oxyCODONE (ROXICODONE) immediate release tablet 10 mg: 10 mg | ORAL | @ 14:00:00 | Stop: 2021-05-20

## 2021-05-12 MED ADMIN — MORPhine (MS CONTIN) 12 hr tablet 15 mg: 15 mg | ORAL | @ 03:00:00 | Stop: 2021-05-23

## 2021-05-12 NOTE — Unmapped (Signed)
Physical Medicine and Rehabilitation  Daily Progress Note [facility]    ASSESSMENT:     Kevin Cohen is a 70 y.o. male ??with a past medical history of metastatic prostate cancer and known bony metastases s/p chemo+RT admitted for cervical myelopathy with a neurologic level of C5 in the setting of incomplete SCI. He is now admitted to [facility] for comprehensive interdisciplinary rehabilitation.   ??  Rehab Impairment Group Code [facility]): (Spinal Cord Dysfunction - Non-Traumatic) [id number] Quadriplegia, Incomplete C5-8  Etiology: Metastatic Prostate Cancer  ??    PLAN:   REHAB:   - PT and OT to maximize functional status with mobility and ADLs as well as prevention of joint contracture.   - RT for community re-integration, education, and leisure support services.  - P&O for assistive devices PRN.  - To be discussed in weekly Interdisciplinary Team Conference.  ??  Non Traumatic, Cervical Spinal Cord Injury: Presented with several month history of symptoms concerning for cervical myelopathy and several week history of worsening LUE weakness. MRI total spine from 9/14 shows diffuse metastatic disease with epidural thickening and spinal canal stenosis from C3-5 without cord signal change, and left foraminal invasion at C5-6. Patient not surgical candidate and placed on steroid taper and radiation treatment as below.  ?? Post-operative Pain:  ? Palliative Care Consult  ? MS Contin 15 mg PO BID  ? Oxycodone 10 mg PO every 4 hours prn pain  ? Duloxetine 30 mg PO daily x 1 week and then increase to 60 mg PO daily. First dose 10/13.  ? S/p Gabapentin to 300 gm PO nightly x 1 week (10/13-10/20)  ? Tylenol 650 mg PO q4h PRN  ? Lidocaine patch, voltaren gel PRN  ?? Neuropathic Pain  ? CTM  ?? Spasticity: No issues with spasms currently. Will monitor.   ? CTM  ? Bladder:?? Patient endorses sensation and is able to void volitionally.   ? Strict I&O's  ?? Bowel:??Volitional. Admission KUB with mod stool burden.   ? Colace 100 mg BID  ? Senna 2 tabs daily with lunch  ? Bisacodyl Suppository daily PRN  ?? Equipment:  ? Place Bilateral hard PRAFO Boots to protect patient's heels from pressure injury and keep foot in neutral position to avoid PF contractures.  ?? Monitor for Autonomic Dysreflexia: Nitropaste PRN  ?? BMP and CBC every Monday/Thursday  ??  Metastatic Prostate Cancer  Previously on hospice 04/2020. Follows with Dr Philomena Course and started ADT +abi/pred. Metastatic prostate cancer with recent cord compression (T5-T6) and worsening??left arm??weakness, MRI total spine from 9/14 shows diffuse metastatic disease with epidural thickening and spinal canal stenosis from C3-5 without cord signal change, and left foraminal invasion at C5-6. SRN felt patient was not surgical candidate and patient started on radiation therapy and steroid taper.   - Rad/Onc consult  - Radiation treatment Monday-Friday (ended 10/19)  - Zytiga 1000 mg daily  - Prednisone taper: 30mg  (10/15-10/17); 20mg  (10/18-10/20);10mg  (10/21-10/23) and 5mg  (10/24-)   - Lupron 7.5mg  monthly (last dose 10/20), to f/u in clinic for q3 month injection after discharge  ??  HTN  - Amlodipine 5 mg daily  ??  HLD  - Lipitor 40 mg daily  ??  Anemia  Chronic inflammatory and iron deficiency  - Ferrous sulfate 350 mg every other day  ??  CKD  Baseline Cr ~1.0-1.2.  -BMP MTh  ??  DVT  Left peroneal and politeal veins dx 04/26/21. IVC filter placed 10/5 by [facility], needs outpt removal  in 3-6 months  - Eliquis 5mg  po bid    DISPO: Patient to be discussed at weekly interdisciplinary team conference.   - EDD: 11/1  - Follow-up: PM&R, PCP, Rad/Onc, Palliative    SUBJECTIVE:     Interval Events: NAEON. Patient doing well this morning working with therapy. Endorses some discomfort from radiation induced esophagitis. States mucositis mixture with lidocaine has not provided much relief, but notes pain meds are working well. Tolerated lupron injection well yesterday.    OBJECTIVE:     Vital signs (last 24 hours):  Temp:  [36.6 ??C (97.9 ??F)-36.7 ??C (98.1 ??F)] 36.6 ??C (97.9 ??F)  Heart Rate:  [70-75] 75  Resp:  [18] 18  BP: (106-114)/(62-72) 114/72  MAP (mmHg):  [72-85] 85  SpO2:  [97 %-98 %] 98 %    Intake/Output (last 3 shifts):  I/O last 3 completed shifts:  In: -   Out: 850 [Urine:850]    Physical Exam:   GEN: NAD,walking with therapy using a hemiwalker  EYES: sclera anicteric, conjunctiva clear   HEENT: MMM  NECK: trachea midline  CV: warm extremities, well perfused, no cyanosis,   RESP:  NWOB on RA  ABD: Not distended  SKIN: no visible masses, lesions, rashes, ecchymoses.   MSK: no notable contractures or ext swelling  NEURO:Alert, MAE, speech fluid and coherent, responds appropriately to questions   PSYCH: mood euthymic, affect appropriate, thought process logical         Medications:  Scheduled   ??? abiraterone (ZYTIGA) tablet 1,000 mg **Patient Supplied** Daily   ??? amLODIPine (NORVASC) tablet 5 mg Daily   ??? apixaban (ELIQUIS) tablet 5 mg BID   ??? atorvastatin (LIPITOR) tablet 40 mg Daily   ??? docusate sodium (COLACE) capsule 100 mg BID   ??? DULoxetine (CYMBALTA) DR capsule 60 mg daily   ??? ferrous sulfate tablet 325 mg Every other day   ??? leuprolide (LUPRON) injection 7.5 mg Q28 Days   ??? lidocaine (LIDODERM) 5 % patch 2 patch Daily   ??? MORPhine (MS CONTIN) 12 hr tablet 15 mg Q12H   ??? predniSONE (DELTASONE) tablet 10 mg Daily   ??? senna (SENOKOT) tablet 2 tablet Daily with lunch     PRN acetaminophen, 650 mg, Q6H PRN  albuterol, 2 puff, Q4H PRN  bisacodyL, 10 mg, Daily PRN  calcium carbonate, 200 mg of elem calcium, TID PRN  guaiFENesin, 100 mg, Q4H PRN  melatonin, 3 mg, Nightly PRN  lidocaine-diphenhydrAMINE-aluminum-magnesium, 10 mL, QID PRN  ondansetron, 4 mg, Q6H PRN  oxyCODONE, 5 mg, Q4H PRN   Or  oxyCODONE, 10 mg, Q4H PRN  polyethylene glycol, 17 g, Daily PRN  simethicone, 80 mg, TID PRN          Labs/Studies: Reviewed     Radiology Results: no new

## 2021-05-12 NOTE — Unmapped (Addendum)
Patient is alert and oriented x4. Continent of bladder and bowel. Pt is reporting throat pain unrelieved by current interventions and neck/shoulder pain moderately well controlled with PRN oxycodone. Patient educated on dietary adjustments to avoid aggravating esophagitis. No new skin issues noted. Safety and fall precautions in place. Call bell and belongings within reach. No falls or injuries, this shift. No changes to the plan of care at this time.   Problem: Rehabilitation (IRF) Plan of Care  Goal: Plan of Care Review  Outcome: Ongoing - Unchanged  Goal: Patient-Specific Goal (Individualized)  Outcome: Ongoing - Unchanged  Goal: Absence of New-Onset Illness or Injury  Outcome: Ongoing - Unchanged  Intervention: Prevent Fall and Fall Injury  Recent Flowsheet Documentation  Taken 05/12/2021 1200 by Wyvonna Plum, RN  Safety Interventions: fall reduction program maintained  Taken 05/12/2021 1000 by Wyvonna Plum, RN  Safety Interventions: fall reduction program maintained  Taken 05/12/2021 0800 by Wyvonna Plum, RN  Safety Interventions: fall reduction program maintained  Goal: Optimal Comfort and Wellbeing  Outcome: Ongoing - Unchanged  Goal: Home and Community Transition Plan Established  Outcome: Ongoing - Unchanged     Problem: Fall Injury Risk  Goal: Absence of Fall and Fall-Related Injury  Outcome: Ongoing - Unchanged  Intervention: Promote Injury-Free Environment  Recent Flowsheet Documentation  Taken 05/12/2021 1200 by Wyvonna Plum, RN  Safety Interventions: fall reduction program maintained  Taken 05/12/2021 1000 by Wyvonna Plum, RN  Safety Interventions: fall reduction program maintained  Taken 05/12/2021 0800 by Wyvonna Plum, RN  Safety Interventions: fall reduction program maintained     Problem: Self-Care Deficit  Goal: Improved Ability to Complete Activities of Daily Living  Outcome: Ongoing - Unchanged     Problem: Skin Injury Risk Increased  Goal: Skin Health and Integrity  Outcome: Ongoing - Unchanged  Intervention: Optimize Skin Protection  Recent Flowsheet Documentation  Taken 05/12/2021 1200 by Wyvonna Plum, RN  Pressure Reduction Techniques: frequent weight shift encouraged  Taken 05/12/2021 1000 by Wyvonna Plum, RN  Pressure Reduction Techniques: frequent weight shift encouraged  Taken 05/12/2021 0800 by Wyvonna Plum, RN  Pressure Reduction Techniques: frequent weight shift encouraged     Problem: Hypertension Comorbidity  Goal: Blood Pressure in Desired Range  Outcome: Ongoing - Unchanged

## 2021-05-12 NOTE — Unmapped (Addendum)
Palliative Care Progress Note    Consultation from Requesting Attending Physician:  Sheryle Spray*  Primary Care Provider:  Abram Sander, MD       Code Status: Full Code   Healthcare decision-maker if lacks capacity:   HCDM (patient stated preference): Noodwood,Catherine - Daughter - [phone]     Assessment/Plan:      SUMMARY:  [name] with metastatic prostate cancer and widespread bone metastasis throughout spine. He was admitted for worsening pain and upper extremity weakness, complicated by co-morbid acute and chronic conditions including h/o spinal cord compression. He has since transitioned from the main medical center to [facility] for continued care.     Palliative care consulted at main medical center for assistance with symptom evaluation and recommendations.     Palliative care continues to follow prn for reassessment of symptoms and regimen.     Kevin Cohen states he is doing well, with the exception of a severe sore throat that has begun after finishing his course of radiation. He expresses hope that it will continue to improve day to day. He is having trouble swallowing anything at this time, pills and solid foods are most troublesome.    Symptom Assessment and Recommendations:      1. Left shoulder, arm and neck pain due to cancer:   combination of nociceptive and neuropathic pain.   Taking an average of 20mg  oxy/24 hours PRN.   --Would consider pre-medicating approximately 1 hour prior to PT/OT sessions. Otherwise continue recommendations per below.     - Continue MS Contin 15 mg PO BID.  Could consider increasing to 15 mg PO q8h sch if pain is limiting PT/OT despite PRN regimen, but this does not currently seem to be an issue.  - Continue oxycodone 10 mg PO every 4 hours prn pain. Due to severely sore throat / radiation related esophagitis, REC transitioning this to liquid (if available) until swallowing and pain is improved. May benefit from crushed or liquid medications as able and temporarily  - Continue duloxetine 60 mg PO daily.   - Continue lidocaine patch to left deltoid. REC add lidocaine patch x1 for neck or ant/post shoulder.    He will be followed by the [facility] Pall[facility] clinic at discharge. Team is aware and will monitor for his discharge from [facility]. Of note, standing appointment for October 20th with Dr. Wanita Chamberlain will need to be rescheduled as tentative DC date is 11/1.   ??  2. Prevention of opioid-induced constipation:   - LBM 10/20  - Continue senna 2 tabs nightly and miralax daily as needed given frequent stools.     Goals of care / ACP:    Decisional capacity at time of visit:  Able to make decisions for himself.     Current goals of care:  Did not discuss today.      Practical, Emotional, Spiritual Support / Other Communication and Counseling:    Brief visit focused on symptom evaluation and discussion of regimen, but provided compassionate support and active listening. He continues to be satisfied with his family support and the care he is receiving at rehab. He denies any unmet needs at this time.     Please page Palliative Care [phone]) if there are any questions. We will follow up again with Kevin Cohen on Monday, 10/24    Subjective:   States pain in neck and throat has worsened since finishing  radiation. Having trouble with painful swallowing today. States mucositis mixture is not helpful. Is open to shifting medication to liquid form as available to include oxycodone liquid.     Denies pain in mouth, but rather pain is further down esophagus. Some additional pain in left neck today which is radiating to his left shoulder. Denies distress but acknowledges the changes above.     Denies nausea, vomiting, constipation, diarrhea, fever, chills    Recent Events:  Continues to do well. Tolerating rehab activities and radiation.     No updates to past / social / family history    Scheduled Meds:  ??? abiraterone 1,000 mg Oral Daily   ??? amLODIPine  5 mg Oral Daily   ??? apixaban  5 mg Oral BID   ??? atorvastatin  40 mg Oral Daily   ??? docusate sodium  100 mg Oral BID   ??? DULoxetine  60 mg Oral daily   ??? ferrous sulfate  325 mg Oral Every other day   ??? leuprolide  7.5 mg Intramuscular Q28 Days   ??? lidocaine  2 patch Transdermal Daily   ??? MORPhine  15 mg Oral Q12H   ??? predniSONE  10 mg Oral Daily    Followed by   ??? [START ON 05/15/2021] predniSONE  5 mg Oral Daily   ??? senna  2 tablet Oral Daily with lunch     Continuous Infusions:  PRN Meds:.acetaminophen, albuterol, bisacodyL, calcium carbonate, guaiFENesin, melatonin, lidocaine-diphenhydrAMINE-aluminum-magnesium, ondansetron, oxyCODONE **OR** oxyCODONE, polyethylene glycol, simethicone     Objective:   Seen in room. NAD. Sitting up in wheelchair near windows, watching television. No visitors at bedside.    Function:  50% - Ambulation: Mainly sit/lie / Unable to do any work, extensive disease / Self-Care:Considerable assistance required, Intake: Normal or reduced / Level of Conscious: Full or confusion    Temp:  [36.6 ??C (97.9 ??F)-36.7 ??C (98.1 ??F)] 36.6 ??C (97.9 ??F)  Heart Rate:  [70-75] 75  Resp:  [18] 18  BP: (106-114)/(62-72) 114/72  SpO2:  [97 %-98 %] 98 %    Physical Exam:  Constitutional: Sitting in WC. appears comfortable, NAD, extremely pleasant  Eyes: no discharge, grossly normal vision  Pulm: normal work of breathing, no accessory muscle use  MSK: no sarcopenia, some tenderness to palpation on inferior aspect of left deltoid, left anterior neck  Skin: no obvious rashes  Neuro: A&Ox4, only able to passively lift left arm but can move L wrist/hand independently  Psych: mood and affect appropriate for situation and congruent    Test Results:  Lab Results   Component Value Date    WBC 4.9 05/11/2021    RBC 2.42 (L) 05/11/2021    HGB 7.5 (L) 05/11/2021    HCT 22.9 (L) 05/11/2021    MCV 94.4 05/11/2021    MCH 31.0 05/11/2021    MCHC 32.8 05/11/2021    RDW 18.4 (H) 05/11/2021 PLT 103 (L) 05/11/2021    MPV 7.7 05/11/2021     Lab Results   Component Value Date    NA 136 05/11/2021    K 3.8 05/11/2021    CL 103 05/11/2021    CO2 26.6 05/11/2021    BUN 22 05/11/2021    CREATININE 0.99 05/11/2021    GLU 97 05/11/2021    CALCIUM 8.9 05/11/2021    ALBUMIN 2.9 (L) 05/05/2021    PHOS 2.9 05/06/2021      Lab Results   Component Value Date    ALKPHOS 732 (H)  05/05/2021    BILITOT 0.4 05/05/2021    PROT 5.4 (L) 05/05/2021    ALBUMIN 2.9 (L) 05/05/2021    ALT <7 (L) 05/05/2021    AST 16 05/05/2021       I personally spent 20 minutes on the floor or unit in direct patient care. The direct patient care time included face-to-face time with the patient, reviewing the patient's chart, communicating with the family and/or other professionals and coordinating care.     Greater than 50% of this time spent on counseling/coordination of care:  Yes.   See ACP Note from today for additional billable service:  No.    Anell Barr, DNP, ANP-BC  Pager [phone]

## 2021-05-12 NOTE — Unmapped (Signed)
[facility] Consult Services  ADVANCED FOOT AND NAIL CARE CONSULT     Reason For Consult:  - Initial  - Nail Care    Assessment:  Received order for [facility] to complete Advanced Foot and Nail Care Service.  Patient reports that he has not been able to independently cut toenails in awhile.    The Patient is agreeable to [facility] completing nail care.    Nail Assessment:  -  Toe Nail:  long thick  -  No Ulcerations, foot deformities, or other nail concerns noted.      Procedure:  -  Patient in need of toenail cleaning and cutting only.  -  All debris removed from below nail surface with cuticle stick, nails cut with sterile clippers, then nails filed with nail file.  -  Feet cleaned and moisturizer applied.  -  Patient tolerated the procedure well    Instruments Used:  -  Sterile nail clippers  -  100/180 grit nail file  -  cuticle stick    Plan:  -  Instructed the Patient the risk of ulceration, infection, and trauma with overgrown nails. Reviewed the importance of following up with [facility], Podiatrist or PCP to continue to maintain nail care/length.    Workup Time:  30 minutes     Charissa Bash, RN, BSN, [employer], [facility]  Wound Ostomy Consult Service

## 2021-05-12 NOTE — Unmapped (Signed)
Patient is alert and oriented x4. Continent of bladder and bowel. Pain is controlled on current medication regimen. Treating patient sore throat with mucositis mix, s/p radiation. PRNS ordered with some relief. No new skin issues noted. Safety and fall precautions in place. Aspiration and fall precautions maintained. Call bell and belongings within reach. No changes to the plan of care at this time. Rounds completed. Nursing will continue to monitor.      Problem: Rehabilitation (IRF) Plan of Care  Goal: Plan of Care Review  Outcome: Progressing  Goal: Patient-Specific Goal (Individualized)  Outcome: Progressing  Goal: Absence of New-Onset Illness or Injury  Outcome: Progressing  Intervention: Prevent Fall and Fall Injury  Recent Flowsheet Documentation  Taken 05/12/2021 0600 by Starla Link, RN  Safety Interventions: fall reduction program maintained  Taken 05/12/2021 0400 by Starla Link, RN  Safety Interventions:   fall reduction program maintained   low bed  Taken 05/12/2021 0000 by Starla Link, RN  Safety Interventions:   fall reduction program maintained   low bed  Taken 05/11/2021 2200 by Starla Link, RN  Safety Interventions:   fall reduction program maintained   low bed  Taken 05/11/2021 2000 by Starla Link, RN  Safety Interventions:   fall reduction program maintained   low bed  Intervention: Prevent VTE (Venous Thromboembolism)  Recent Flowsheet Documentation  Taken 05/11/2021 2000 by Starla Link, RN  VTE Prevention/Management: anticoagulant therapy  Goal: Optimal Comfort and Wellbeing  Outcome: Progressing  Goal: Home and Community Transition Plan Established  Outcome: Progressing     Problem: Fall Injury Risk  Goal: Absence of Fall and Fall-Related Injury  Outcome: Progressing  Intervention: Promote Injury-Free Environment  Recent Flowsheet Documentation  Taken 05/12/2021 0600 by Starla Link, RN  Safety Interventions: fall reduction program maintained  Taken 05/12/2021 0400 by Starla Link, RN  Safety Interventions:   fall reduction program maintained   low bed  Taken 05/12/2021 0000 by Starla Link, RN  Safety Interventions:   fall reduction program maintained   low bed  Taken 05/11/2021 2200 by Starla Link, RN  Safety Interventions:   fall reduction program maintained   low bed  Taken 05/11/2021 2000 by Starla Link, RN  Safety Interventions:   fall reduction program maintained   low bed     Problem: Self-Care Deficit  Goal: Improved Ability to Complete Activities of Daily Living  Outcome: Progressing     Problem: Skin Injury Risk Increased  Goal: Skin Health and Integrity  Outcome: Progressing  Intervention: Optimize Skin Protection  Recent Flowsheet Documentation  Taken 05/12/2021 0600 by Starla Link, RN  Pressure Reduction Devices: chair cushion utilized  Taken 05/12/2021 0000 by Starla Link, RN  Pressure Reduction Techniques: weight shift assistance provided  Head of Bed (HOB) Positioning: HOB elevated  Pressure Reduction Devices: heel offloading device utilized  Skin Protection: incontinence pads utilized  Taken 05/11/2021 2200 by Starla Link, RN  Pressure Reduction Techniques: frequent weight shift encouraged  Head of Bed (HOB) Positioning: HOB elevated  Skin Protection: incontinence pads utilized  Taken 05/11/2021 2000 by Starla Link, RN  Head of Bed [facility]) Positioning: HOB elevated  Pressure Reduction Devices: chair cushion utilized  Skin Protection: incontinence pads utilized     Problem: Hypertension Comorbidity  Goal: Blood Pressure in Desired Range  Outcome: Progressing

## 2021-05-13 MED ADMIN — apixaban (ELIQUIS) tablet 5 mg: 5 mg | ORAL | @ 01:00:00

## 2021-05-13 MED ADMIN — abiraterone (ZYTIGA) tablet 1,000 mg **Patient Supplied**: 1000 mg | ORAL | @ 14:00:00

## 2021-05-13 MED ADMIN — lidocaine (LIDODERM) 5 % patch 2 patch: 2 | TRANSDERMAL | @ 14:00:00

## 2021-05-13 MED ADMIN — MORPhine (MS CONTIN) 12 hr tablet 15 mg: 15 mg | ORAL | @ 01:00:00 | Stop: 2021-05-23

## 2021-05-13 MED ADMIN — predniSONE (DELTASONE) tablet 10 mg: 10 mg | ORAL | @ 14:00:00 | Stop: 2021-05-15

## 2021-05-13 MED ADMIN — oxyCODONE (ROXICODONE) immediate release tablet 10 mg: 10 mg | ORAL | @ 19:00:00 | Stop: 2021-05-20

## 2021-05-13 MED ADMIN — mucositis mixture (with lidocaine): 10 mL | OROMUCOSAL | @ 19:00:00

## 2021-05-13 MED ADMIN — oxyCODONE (ROXICODONE) immediate release tablet 10 mg: 10 mg | ORAL | @ 03:00:00 | Stop: 2021-05-20

## 2021-05-13 MED ADMIN — MORPhine (MS CONTIN) 12 hr tablet 15 mg: 15 mg | ORAL | @ 14:00:00 | Stop: 2021-05-23

## 2021-05-13 MED ADMIN — atorvastatin (LIPITOR) tablet 40 mg: 40 mg | ORAL | @ 14:00:00

## 2021-05-13 MED ADMIN — oxyCODONE (ROXICODONE) immediate release tablet 10 mg: 10 mg | ORAL | @ 14:00:00 | Stop: 2021-05-20

## 2021-05-13 MED ADMIN — DULoxetine (CYMBALTA) DR capsule 60 mg: 60 mg | ORAL | @ 10:00:00

## 2021-05-13 MED ADMIN — amLODIPine (NORVASC) tablet 5 mg: 5 mg | ORAL | @ 18:00:00

## 2021-05-13 MED ADMIN — apixaban (ELIQUIS) tablet 5 mg: 5 mg | ORAL | @ 14:00:00

## 2021-05-13 MED ADMIN — oxyCODONE (ROXICODONE) immediate release tablet 10 mg: 10 mg | ORAL | @ 10:00:00 | Stop: 2021-05-20

## 2021-05-13 NOTE — Unmapped (Signed)
Patient is alert and oriented x 4. Regular diet. Takes medicine whole. Continent of bowel and bladder. On chemo precaution. Continues to have throat pain with difficulty swallowing. Neck and shoulder pain managed with PRN Oxycodone. Skin is intact. Aspiration, skin and fall precautions maintained. Bed in lowest position, wheels locked. Call bell and phone within reach. No concerns at this time. No falls or injuries this shift. Hourly rounding ongoing. Will continue to monitor.     Problem: Rehabilitation (IRF) Plan of Care  Goal: Absence of New-Onset Illness or Injury  Outcome: Progressing  Intervention: Prevent Fall and Fall Injury  Recent Flowsheet Documentation  Taken 05/12/2021 2200 by Gwyneth Sprout, RN  Safety Interventions: fall reduction program maintained  Taken 05/12/2021 2000 by Gwyneth Sprout, RN  Safety Interventions: fall reduction program maintained  Intervention: Prevent VTE (Venous Thromboembolism)  Recent Flowsheet Documentation  Taken 05/12/2021 2112 by Gwyneth Sprout, RN  VTE Prevention/Management: anticoagulant therapy  Goal: Optimal Comfort and Wellbeing  Outcome: Progressing     Problem: Fall Injury Risk  Goal: Absence of Fall and Fall-Related Injury  Outcome: Progressing  Intervention: Promote Injury-Free Environment  Recent Flowsheet Documentation  Taken 05/12/2021 2200 by Gwyneth Sprout, RN  Safety Interventions: fall reduction program maintained  Taken 05/12/2021 2000 by Gwyneth Sprout, RN  Safety Interventions: fall reduction program maintained

## 2021-05-13 NOTE — Unmapped (Signed)
Pt c/o throat pain, medicated with oxycodone with minimal relief of pain; oral rinse ordered but waiting for pharmacy to deliver; norvasc was held in the morning since SBP< 110; Dr. Raiford Noble notified and order parameters changed; norvasc was given to pt at a later than scheduled time after BP parameters were met; Dr. Raiford Noble aware of low BP

## 2021-05-13 NOTE — Unmapped (Signed)
Physical Medicine and Rehabilitation  Daily Progress Note [facility]    ASSESSMENT:     Kevin Cohen is a 70 y.o. male ??with a past medical history of metastatic prostate cancer and known bony metastases s/p chemo+RT admitted for cervical myelopathy with a neurologic level of C5 in the setting of incomplete SCI. He is now admitted to [facility] for comprehensive interdisciplinary rehabilitation.   ??  Rehab Impairment Group Code [facility]): (Spinal Cord Dysfunction - Non-Traumatic) [id number] Quadriplegia, Incomplete C5-8  Etiology: Metastatic Prostate Cancer  ??    PLAN:     Weekend Updates  ?? 05/13/21: NAEO.  Kevin Cohen reports that he is still continuing to have pain and discomfort from his radiation-induced esophagitis.  Encouraged him to use mucositis mixture prior to eating to help better control symptoms.  Otherwise, he denied any other concerns or questions      REHAB:   - PT and OT to maximize functional status with mobility and ADLs as well as prevention of joint contracture.   - RT for community re-integration, education, and leisure support services.  - P&O for assistive devices PRN.  - To be discussed in weekly Interdisciplinary Team Conference.  ??  Non Traumatic, Cervical Spinal Cord Injury: Presented with several month history of symptoms concerning for cervical myelopathy and several week history of worsening LUE weakness. MRI total spine from 9/14 shows diffuse metastatic disease with epidural thickening and spinal canal stenosis from C3-5 without cord signal change, and left foraminal invasion at C5-6. Patient not surgical candidate and placed on steroid taper and radiation treatment as below.  ?? Post-operative Pain:  ? Palliative Care Consult  ? MS Contin 15 mg PO BID  ? Oxycodone 10 mg PO every 4 hours prn pain  ? Duloxetine 30 mg PO daily x 1 week and then increase to 60 mg PO daily. First dose 10/13.  ? S/p Gabapentin to 300 gm PO nightly x 1 week (10/13-10/20)  ? Tylenol 650 mg PO q4h PRN  ? Lidocaine patch, voltaren gel PRN  ?? Neuropathic Pain  ? CTM  ?? Spasticity: No issues with spasms currently. Will monitor.   ? CTM  ? Bladder:?? Patient endorses sensation and is able to void volitionally.   ? Strict I&O's  ?? Bowel:??Volitional. Admission KUB with mod stool burden.   ? Colace 100 mg BID  ? Senna 2 tabs daily with lunch  ? Bisacodyl Suppository daily PRN  ?? Equipment:  ? Place Bilateral hard PRAFO Boots to protect patient's heels from pressure injury and keep foot in neutral position to avoid PF contractures.  ?? Monitor for Autonomic Dysreflexia: Nitropaste PRN  ?? BMP and CBC every Monday/Thursday  ??  Metastatic Prostate Cancer  Previously on hospice 04/2020. Follows with Kevin Cohen and started ADT +abi/pred. Metastatic prostate cancer with recent cord compression (T5-T6) and worsening??left arm??weakness, MRI total spine from 9/14 shows diffuse metastatic disease with epidural thickening and spinal canal stenosis from C3-5 without cord signal change, and left foraminal invasion at C5-6. SRN felt patient was not surgical candidate and patient started on radiation therapy and steroid taper.   - Rad/Onc consult  - Radiation treatment Monday-Friday (ended 10/19)  - Zytiga 1000 mg daily  - Prednisone taper: 30mg  (10/15-10/17); 20mg  (10/18-10/20);10mg  (10/21-10/23) and 5mg  (10/24-)   - Lupron 7.5mg  monthly (last dose 10/20), to f/u in clinic for q3 month injection after discharge  ??  HTN  - Amlodipine 5 mg daily  ??  HLD  -  Lipitor 40 mg daily  ??  Anemia  Chronic inflammatory and iron deficiency  - Ferrous sulfate 350 mg every other day  ??  CKD  Baseline Cr ~1.0-1.2.  -BMP MTh  ??  DVT  Left peroneal and politeal veins dx 04/26/21. IVC filter placed 10/5 by VIR, needs outpt removal in 3-6 months  - Eliquis 5mg  po bid    DISPO: Patient to be discussed at weekly interdisciplinary team conference.   - EDD: 11/1  - Follow-up: PM&R, PCP, Rad/Onc, Palliative    SUBJECTIVE:     Interval Events: See above.    OBJECTIVE:     Vital signs (last 24 hours):  Temp:  [36.7 ??C (98.1 ??F)-36.8 ??C (98.2 ??F)] 36.8 ??C (98.2 ??F)  Heart Rate:  [64-79] 79  Resp:  [17-18] 17  BP: (109-117)/(63-68) 109/64  MAP (mmHg):  [78] 78  SpO2:  [98 %-100 %] 100 %    Intake/Output (last 3 shifts):  I/O last 3 completed shifts:  In: -   Out: 400 [Urine:400]    Physical Exam:   GEN: NAD, sitting in MWC at bedside  EYES: sclera anicteric, conjunctiva clear   HEENT: NCAT  NECK: trachea midline  CV: warm extremities, well perfused, no cyanosis,   RESP:  NWOB on RA  ABD: Not distended  SKIN: no visible masses, lesions, rashes, ecchymoses.   MSK: no notable contractures or ext swelling  NEURO:Alert, MAE, speech fluid and coherent, responds appropriately to questions   PSYCH: mood euthymic, affect appropriate, thought process logical         Medications:  Scheduled   ??? abiraterone (ZYTIGA) tablet 1,000 mg **Patient Supplied** Daily   ??? amLODIPine (NORVASC) tablet 5 mg Daily   ??? apixaban (ELIQUIS) tablet 5 mg BID   ??? atorvastatin (LIPITOR) tablet 40 mg Daily   ??? docusate sodium (COLACE) capsule 100 mg BID   ??? DULoxetine (CYMBALTA) Kevin capsule 60 mg daily   ??? ferrous sulfate tablet 325 mg Every other day   ??? leuprolide (LUPRON) injection 7.5 mg Q28 Days   ??? lidocaine (LIDODERM) 5 % patch 2 patch Daily   ??? MORPhine (MS CONTIN) 12 hr tablet 15 mg Q12H   ??? predniSONE (DELTASONE) tablet 10 mg Daily   ??? senna (SENOKOT) tablet 2 tablet Daily with lunch     PRN acetaminophen, 650 mg, Q6H PRN  albuterol, 2 puff, Q4H PRN  bisacodyL, 10 mg, Daily PRN  calcium carbonate, 200 mg of elem calcium, TID PRN  guaiFENesin, 100 mg, Q4H PRN  melatonin, 3 mg, Nightly PRN  lidocaine-diphenhydrAMINE-aluminum-magnesium, 10 mL, QID PRN  ondansetron, 4 mg, Q6H PRN  oxyCODONE, 5 mg, Q4H PRN   Or  oxyCODONE, 10 mg, Q4H PRN  polyethylene glycol, 17 g, Daily PRN  simethicone, 80 mg, TID PRN      Labs/Studies: Reviewed     Radiology Results: no new

## 2021-05-14 MED ADMIN — lidocaine (XYLOCAINE) 2% viscous mucosal solution: 10 mL | ORAL | @ 18:00:00

## 2021-05-14 MED ADMIN — predniSONE (DELTASONE) tablet 10 mg: 10 mg | ORAL | @ 13:00:00 | Stop: 2021-05-14

## 2021-05-14 MED ADMIN — oxyCODONE (ROXICODONE) immediate release tablet 10 mg: 10 mg | ORAL | @ 13:00:00 | Stop: 2021-05-20

## 2021-05-14 MED ADMIN — atorvastatin (LIPITOR) tablet 40 mg: 40 mg | ORAL | @ 13:00:00

## 2021-05-14 MED ADMIN — sucralfate (CARAFATE) oral suspension: 1 g | ORAL | @ 18:00:00

## 2021-05-14 MED ADMIN — MORPhine (MS CONTIN) 12 hr tablet 15 mg: 15 mg | ORAL | @ 01:00:00 | Stop: 2021-05-23

## 2021-05-14 MED ADMIN — abiraterone (ZYTIGA) tablet 1,000 mg **Patient Supplied**: 1000 mg | ORAL | @ 13:00:00

## 2021-05-14 MED ADMIN — MORPhine (MS CONTIN) 12 hr tablet 15 mg: 15 mg | ORAL | @ 15:00:00 | Stop: 2021-05-23

## 2021-05-14 MED ADMIN — apixaban (ELIQUIS) tablet 5 mg: 5 mg | ORAL | @ 01:00:00

## 2021-05-14 MED ADMIN — apixaban (ELIQUIS) tablet 5 mg: 5 mg | ORAL | @ 13:00:00

## 2021-05-14 MED ADMIN — ferrous sulfate tablet 325 mg: 325 mg | ORAL | @ 13:00:00

## 2021-05-14 MED ADMIN — oxyCODONE (ROXICODONE) immediate release tablet 10 mg: 10 mg | ORAL | @ 06:00:00 | Stop: 2021-05-20

## 2021-05-14 MED ADMIN — amLODIPine (NORVASC) tablet 5 mg: 5 mg | ORAL | @ 13:00:00

## 2021-05-14 MED ADMIN — guaiFENesin (ROBITUSSIN) oral syrup: 100 mg | ORAL | @ 10:00:00

## 2021-05-14 MED ADMIN — mucositis mixture (with lidocaine): 10 mL | OROMUCOSAL | @ 13:00:00 | Stop: 2021-05-14

## 2021-05-14 MED ADMIN — mucositis mixture (with lidocaine): 10 mL | OROMUCOSAL | @ 07:00:00 | Stop: 2021-05-14

## 2021-05-14 MED ADMIN — lidocaine (LIDODERM) 5 % patch 2 patch: 2 | TRANSDERMAL | @ 13:00:00

## 2021-05-14 MED ADMIN — oxyCODONE (ROXICODONE) immediate release tablet 10 mg: 10 mg | ORAL | @ 20:00:00 | Stop: 2021-05-20

## 2021-05-14 MED ADMIN — DULoxetine (CYMBALTA) DR capsule 60 mg: 60 mg | ORAL | @ 10:00:00

## 2021-05-14 MED ADMIN — aluminum-magnesium hydroxide-simethicone (MAALOX MAX) 80-80-8 mg/mL oral suspension: 30 mL | ORAL | @ 18:00:00

## 2021-05-14 NOTE — Unmapped (Signed)
Patient is alert and oriented x 4. Regular diet. Takes medicine whole. Continent of bowel and bladder, LBM 10/22. Chemo precautions maintained. PRN mucositis given for throat pain with no significant relief. Neck and shoulder pain managed with PRN Oxycodone. Skin is intact. Aspiration, skin and fall precautions maintained. Bed in lowest position, wheels locked. Call bell and phone within reach. No new concerns at this time. No falls or injuries this shift. Hourly rounding ongoing. Will continue to monitor.      Problem: Rehabilitation (IRF) Plan of Care  Goal: Plan of Care Review  Outcome: Progressing  Goal: Patient-Specific Goal (Individualized)  Outcome: Progressing  Goal: Absence of New-Onset Illness or Injury  Outcome: Progressing  Intervention: Prevent Fall and Fall Injury  Recent Flowsheet Documentation  Taken 05/14/2021 0200 by Gwyneth Sprout, RN  Safety Interventions:   aspiration precautions   fall reduction program maintained   chemotherapeutic agent precautions   lighting adjusted for tasks/safety   low bed  Taken 05/14/2021 0000 by Gwyneth Sprout, RN  Safety Interventions:   fall reduction program maintained   chemotherapeutic agent precautions   low bed  Taken 05/13/2021 2200 by Gwyneth Sprout, RN  Safety Interventions:   fall reduction program maintained   low bed   lighting adjusted for tasks/safety   chemotherapeutic agent precautions  Taken 05/13/2021 2000 by Gwyneth Sprout, RN  Safety Interventions:   fall reduction program maintained   low bed  Goal: Optimal Comfort and Wellbeing  Outcome: Progressing

## 2021-05-14 NOTE — Unmapped (Signed)
Physical Medicine and Rehabilitation  Daily Progress Note [facility]    ASSESSMENT:     Kevin Cohen is a 70 y.o. male ??with a past medical history of metastatic prostate cancer and known bony metastases s/p chemo+RT admitted for cervical myelopathy with a neurologic level of C5 in the setting of incomplete SCI. He is now admitted to [facility] for comprehensive interdisciplinary rehabilitation.   ??  Rehab Impairment Group Code [facility]): (Spinal Cord Dysfunction - Non-Traumatic) [id number] Quadriplegia, Incomplete C5-8  Etiology: Metastatic Prostate Cancer  ??    PLAN:     Weekend Updates  ?? 05/13/21: NAEO.  Mr. [name] reports that he is still continuing to have pain and discomfort from his radiation-induced esophagitis.  Encouraged him to use mucositis mixture prior to eating to help better control symptoms.  Otherwise, he denied any other concerns or questions  ?? 05/14/21: NAEO. Still having significant symptoms from esophagitis - substituted  added on GI Cocktail in place of mucositis mixture as patient was not getting any significant improvement with mucositis mixture. Otherwise, he denied any other concerns or complaints.    REHAB:   - PT and OT to maximize functional status with mobility and ADLs as well as prevention of joint contracture.   - RT for community re-integration, education, and leisure support services.  - P&O for assistive devices PRN.  - To be discussed in weekly Interdisciplinary Team Conference.  ??  Non Traumatic, Cervical Spinal Cord Injury: Presented with several month history of symptoms concerning for cervical myelopathy and several week history of worsening LUE weakness. MRI total spine from 9/14 shows diffuse metastatic disease with epidural thickening and spinal canal stenosis from C3-5 without cord signal change, and left foraminal invasion at C5-6. Patient not surgical candidate and placed on steroid taper and radiation treatment as below.  ?? Post-operative Pain:  ? Palliative Care Consult  ? MS Contin 15 mg PO BID  ? Oxycodone 10 mg PO every 4 hours prn pain  ? Duloxetine 30 mg PO daily x 1 week and then increase to 60 mg PO daily. First dose 10/13.  ? S/p Gabapentin to 300 gm PO nightly x 1 week (10/13-10/20)  ? Tylenol 650 mg PO q4h PRN  ? Lidocaine patch, voltaren gel PRN  ?? Neuropathic Pain  ? CTM  ?? Spasticity: No issues with spasms currently. Will monitor.   ? CTM  ? Bladder:?? Patient endorses sensation and is able to void volitionally.   ? Strict I&O's  ?? Bowel:??Volitional. Admission KUB with mod stool burden.   ? Colace 100 mg BID  ? Senna 2 tabs daily with lunch  ? Bisacodyl Suppository daily PRN  ?? Equipment:  ? Place Bilateral hard PRAFO Boots to protect patient's heels from pressure injury and keep foot in neutral position to avoid PF contractures.  ?? Monitor for Autonomic Dysreflexia: Nitropaste PRN  ?? BMP and CBC every Monday/Thursday  ??  Metastatic Prostate Cancer  Previously on hospice 04/2020. Follows with Dr Philomena Course and started ADT +abi/pred. Metastatic prostate cancer with recent cord compression (T5-T6) and worsening??left arm??weakness, MRI total spine from 9/14 shows diffuse metastatic disease with epidural thickening and spinal canal stenosis from C3-5 without cord signal change, and left foraminal invasion at C5-6. SRN felt patient was not surgical candidate and patient started on radiation therapy and steroid taper.   - Rad/Onc consult  - Radiation treatment Monday-Friday (ended 10/19)  - Zytiga 1000 mg daily  - Prednisone taper: 30mg  (10/15-10/17); 20mg  (10/18-10/20);10mg  (  10/21-10/23) and 5mg  (10/24-)   - Lupron 7.5mg  monthly (last dose 10/20), to f/u in clinic for q3 month injection after discharge  ??  HTN  - Amlodipine 5 mg daily  ??  HLD  - Lipitor 40 mg daily  ??  Anemia  Chronic inflammatory and iron deficiency  - Ferrous sulfate 350 mg every other day  ??  CKD  Baseline Cr ~1.0-1.2.  -BMP MTh  ??  DVT  Left peroneal and politeal veins dx 04/26/21. IVC filter placed 10/5 by VIR, needs outpt removal in 3-6 months  - Eliquis 5mg  po bid    DISPO: Patient to be discussed at weekly interdisciplinary team conference.   - EDD: 11/1  - Follow-up: PM&R, PCP, Rad/Onc, Palliative    SUBJECTIVE:     Interval Events: See above.    OBJECTIVE:     Vital signs (last 24 hours):  Temp:  [36.8 ??C (98.2 ??F)-37.5 ??C (99.5 ??F)] 37.5 ??C (99.5 ??F)  Heart Rate:  [77-85] 85  Resp:  [18] 18  BP: (97-113)/(58-69) 110/62  MAP (mmHg):  [71-83] 77  SpO2:  [99 %-100 %] 99 %    Intake/Output (last 3 shifts):  I/O last 3 completed shifts:  In: -   Out: 450 [Urine:450]    Physical Exam:   GEN: NAD, sitting in MWC at bedside  EYES: sclera anicteric, conjunctiva clear   HEENT: NCAT  NECK: trachea midline  CV: warm extremities, well perfused, no cyanosis,   RESP:  NWOB on RA  ABD: Not distended  SKIN: no visible masses, lesions, rashes, ecchymoses.   MSK: no notable contractures or ext swelling  NEURO:Alert, MAE, speech fluid and coherent, responds appropriately to questions   PSYCH: mood euthymic, affect appropriate, thought process logical         Medications:  Scheduled   ??? abiraterone (ZYTIGA) tablet 1,000 mg **Patient Supplied** Daily   ??? amLODIPine (NORVASC) tablet 5 mg Daily   ??? apixaban (ELIQUIS) tablet 5 mg BID   ??? atorvastatin (LIPITOR) tablet 40 mg Daily   ??? docusate sodium (COLACE) capsule 100 mg BID   ??? DULoxetine (CYMBALTA) DR capsule 60 mg daily   ??? ferrous sulfate tablet 325 mg Every other day   ??? leuprolide (LUPRON) injection 7.5 mg Q28 Days   ??? lidocaine (LIDODERM) 5 % patch 2 patch Daily   ??? MORPhine (MS CONTIN) 12 hr tablet 15 mg Q12H   ??? predniSONE (DELTASONE) tablet 10 mg Daily   ??? senna (SENOKOT) tablet 2 tablet Daily with lunch     PRN acetaminophen, 650 mg, Q6H PRN  albuterol, 2 puff, Q4H PRN  bisacodyL, 10 mg, Daily PRN  calcium carbonate, 200 mg of elem calcium, TID PRN  guaiFENesin, 100 mg, Q4H PRN  melatonin, 3 mg, Nightly PRN  lidocaine-diphenhydrAMINE-aluminum-magnesium, 10 mL, QID PRN  ondansetron, 4 mg, Q6H PRN  oxyCODONE, 5 mg, Q4H PRN   Or  oxyCODONE, 10 mg, Q4H PRN  polyethylene glycol, 17 g, Daily PRN  simethicone, 80 mg, TID PRN      Labs/Studies: Reviewed     Radiology Results: no new

## 2021-05-14 NOTE — Unmapped (Incomplete)
Patient is alert and oriented x4. Continent of bladder and bowel. Pain is minimally controlled on current regimen. Patient reporting severe esophagitis unrelieved with current interventions. Pain currently interfering with oral nutrition and hydration. Productive cough. No new skin issues noted. Safety and fall precautions in place. Call bell and belongings within reach. No falls or injuries, this shift. No changes to the plan of care at this time.   Problem: Rehabilitation (IRF) Plan of Care  Goal: Plan of Care Review  Outcome: Ongoing - Unchanged  Goal: Patient-Specific Goal (Individualized)  Outcome: Ongoing - Unchanged  Goal: Absence of New-Onset Illness or Injury  Outcome: Ongoing - Unchanged  Intervention: Prevent Fall and Fall Injury  Recent Flowsheet Documentation  Taken 05/14/2021 1000 by Wyvonna Plum, RN  Safety Interventions: fall reduction program maintained  Taken 05/14/2021 0800 by Wyvonna Plum, RN  Safety Interventions: fall reduction program maintained  Goal: Optimal Comfort and Wellbeing  Outcome: Ongoing - Unchanged  Goal: Home and Community Transition Plan Established  Outcome: Ongoing - Unchanged     Problem: Fall Injury Risk  Goal: Absence of Fall and Fall-Related Injury  Outcome: Ongoing - Unchanged  Intervention: Promote Injury-Free Environment  Recent Flowsheet Documentation  Taken 05/14/2021 1000 by Wyvonna Plum, RN  Safety Interventions: fall reduction program maintained  Taken 05/14/2021 0800 by Wyvonna Plum, RN  Safety Interventions: fall reduction program maintained     Problem: Self-Care Deficit  Goal: Improved Ability to Complete Activities of Daily Living  Outcome: Ongoing - Unchanged     Problem: Skin Injury Risk Increased  Goal: Skin Health and Integrity  Outcome: Ongoing - Unchanged  Intervention: Optimize Skin Protection  Recent Flowsheet Documentation  Taken 05/14/2021 0800 by Wyvonna Plum, RN  Pressure Reduction Techniques: frequent weight shift encouraged     Problem: Hypertension Comorbidity  Goal: Blood Pressure in Desired Range  Outcome: Ongoing - Unchanged

## 2021-05-15 LAB — CBC
HEMATOCRIT: 23.1 % — ABNORMAL LOW (ref 39.0–48.0)
HEMOGLOBIN: 7.8 g/dL — ABNORMAL LOW (ref 12.9–16.5)
MEAN CORPUSCULAR HEMOGLOBIN CONC: 33.9 g/dL (ref 32.0–36.0)
MEAN CORPUSCULAR HEMOGLOBIN: 31.3 pg (ref 25.9–32.4)
MEAN CORPUSCULAR VOLUME: 92.5 fL (ref 77.6–95.7)
MEAN PLATELET VOLUME: 7.3 fL (ref 6.8–10.7)
PLATELET COUNT: 92 10*9/L — ABNORMAL LOW (ref 150–450)
RED BLOOD CELL COUNT: 2.49 10*12/L — ABNORMAL LOW (ref 4.26–5.60)
RED CELL DISTRIBUTION WIDTH: 19.4 % — ABNORMAL HIGH (ref 12.2–15.2)
WBC ADJUSTED: 3.5 10*9/L — ABNORMAL LOW (ref 3.6–11.2)

## 2021-05-15 LAB — BASIC METABOLIC PANEL
ANION GAP: 11 mmol/L (ref 5–14)
BLOOD UREA NITROGEN: 24 mg/dL — ABNORMAL HIGH (ref 9–23)
BUN / CREAT RATIO: 23
CALCIUM: 9.7 mg/dL (ref 8.7–10.4)
CHLORIDE: 104 mmol/L (ref 98–107)
CO2: 24.3 mmol/L (ref 20.0–31.0)
CREATININE: 1.03 mg/dL
EGFR CKD-EPI (2021) MALE: 79 mL/min/{1.73_m2} (ref >=60)
GLUCOSE RANDOM: 91 mg/dL (ref 70–179)
POTASSIUM: 4 mmol/L (ref 3.4–4.8)
SODIUM: 139 mmol/L (ref 135–145)

## 2021-05-15 MED ADMIN — atorvastatin (LIPITOR) tablet 40 mg: 40 mg | ORAL | @ 14:00:00

## 2021-05-15 MED ADMIN — apixaban (ELIQUIS) tablet 5 mg: 5 mg | ORAL | @ 14:00:00

## 2021-05-15 MED ADMIN — oxyCODONE (ROXICODONE) immediate release tablet 10 mg: 10 mg | ORAL | @ 14:00:00 | Stop: 2021-05-15

## 2021-05-15 MED ADMIN — docusate sodium (COLACE) capsule 100 mg: 100 mg | ORAL | @ 14:00:00 | Stop: 2021-05-15

## 2021-05-15 MED ADMIN — apixaban (ELIQUIS) tablet 5 mg: 5 mg | ORAL | @ 02:00:00

## 2021-05-15 MED ADMIN — lidocaine (LIDODERM) 5 % patch 2 patch: 2 | TRANSDERMAL | @ 14:00:00 | Stop: 2021-05-15

## 2021-05-15 MED ADMIN — abiraterone (ZYTIGA) tablet 1,000 mg **Patient Supplied**: 1000 mg | ORAL | @ 14:00:00

## 2021-05-15 MED ADMIN — amLODIPine (NORVASC) tablet 5 mg: 5 mg | ORAL | @ 14:00:00 | Stop: 2021-05-15

## 2021-05-15 MED ADMIN — oxyCODONE (ROXICODONE) immediate release tablet 10 mg: 10 mg | ORAL | @ 02:00:00 | Stop: 2021-05-20

## 2021-05-15 MED ADMIN — MORPhine (MS CONTIN) 12 hr tablet 15 mg: 15 mg | ORAL | @ 14:00:00 | Stop: 2021-05-23

## 2021-05-15 MED ADMIN — oxyCODONE (ROXICODONE) immediate release tablet 10 mg: 10 mg | ORAL | @ 17:00:00 | Stop: 2021-05-15

## 2021-05-15 MED ADMIN — lidocaine (LIDODERM) 5 % patch 3 patch: 3 | TRANSDERMAL | @ 22:00:00

## 2021-05-15 MED ADMIN — DULoxetine (CYMBALTA) DR capsule 60 mg: 60 mg | ORAL | @ 11:00:00

## 2021-05-15 MED ADMIN — oxyCODONE (ROXICODONE) 5 mg/5 mL solution 10 mg: 10 mg | ORAL | @ 22:00:00 | Stop: 2021-05-29

## 2021-05-15 MED ADMIN — predniSONE (DELTASONE) tablet 5 mg: 5 mg | ORAL | @ 14:00:00 | Stop: 2021-05-15

## 2021-05-15 MED ADMIN — sucralfate (CARAFATE) oral suspension: 1 g | ORAL | @ 02:00:00

## 2021-05-15 MED ADMIN — MORPhine (MS CONTIN) 12 hr tablet 15 mg: 15 mg | ORAL | @ 02:00:00 | Stop: 2021-05-23

## 2021-05-15 NOTE — Unmapped (Signed)
Patient is alert and oriented x 4. Regular diet. Takes medicine whole. Continent of bowel and bladder, LBM 10/22. Chemo precautions maintained. PRN mucositis given for throat pain with no significant relief. Neck and shoulder pain managed with PRN Oxycodone. Skin is intact. Aspiration, skin and fall precautions maintained. Bed in lowest position, wheels locked. Call bell and phone within reach. C/o mouth and throat pain with swallowing. Meds given at bedtime, adequate. Pt states he is able to stand pivot for transfers. No falls or injuries this shift. Hourly rounding ongoing. Will continue to monitor  Problem: Rehabilitation (IRF) Plan of Care  Goal: Plan of Care Review  Outcome: Progressing     Problem: Rehabilitation (IRF) Plan of Care  Goal: Absence of New-Onset Illness or Injury  Outcome: Progressing  Intervention: Prevent Fall and Fall Injury  Recent Flowsheet Documentation  Taken 05/15/2021 0200 by Wynelle Fanny Montgomery-Crabtree, RN  Safety Interventions:   fall reduction program maintained   nonskid shoes/slippers when out of bed  Taken 05/15/2021 0000 by Jeralyn Bennett, RN  Safety Interventions:   fall reduction program maintained   nonskid shoes/slippers when out of bed  Taken 05/14/2021 2200 by Wynelle Fanny Montgomery-Crabtree, RN  Safety Interventions:   fall reduction program maintained   nonskid shoes/slippers when out of bed  Taken 05/14/2021 2000 by Jeralyn Bennett, RN  Safety Interventions:   fall reduction program maintained   nonskid shoes/slippers when out of bed  Taken 05/14/2021 1930 by Jeralyn Bennett, RN  Safety Interventions:   fall reduction program maintained   nonskid shoes/slippers when out of bed   bleeding precautions  Intervention: Prevent VTE (Venous Thromboembolism)  Recent Flowsheet Documentation  Taken 05/14/2021 2140 by Wynelle Fanny Montgomery-Crabtree, RN  VTE Prevention/Management: anticoagulant therapy     Problem: Rehabilitation (IRF) Plan of Care  Goal: Optimal Comfort and Wellbeing  Outcome: Progressing     Problem: Fall Injury Risk  Goal: Absence of Fall and Fall-Related Injury  Outcome: Progressing  Intervention: Identify and Manage Contributors  Recent Flowsheet Documentation  Taken 05/14/2021 2140 by Wynelle Fanny Montgomery-Crabtree, RN  Self-Care Promotion: BADL personal objects within reach  Intervention: Promote Injury-Free Environment  Recent Flowsheet Documentation  Taken 05/15/2021 0200 by Wynelle Fanny Montgomery-Crabtree, RN  Safety Interventions:   fall reduction program maintained   nonskid shoes/slippers when out of bed  Taken 05/15/2021 0000 by Wynelle Fanny Montgomery-Crabtree, RN  Safety Interventions:   fall reduction program maintained   nonskid shoes/slippers when out of bed  Taken 05/14/2021 2200 by Wynelle Fanny Montgomery-Crabtree, RN  Safety Interventions:   fall reduction program maintained   nonskid shoes/slippers when out of bed  Taken 05/14/2021 2000 by Jeralyn Bennett, RN  Safety Interventions:   fall reduction program maintained   nonskid shoes/slippers when out of bed  Taken 05/14/2021 1930 by Wynelle Fanny Montgomery-Crabtree, RN  Safety Interventions:   fall reduction program maintained   nonskid shoes/slippers when out of bed   bleeding precautions

## 2021-05-15 NOTE — Unmapped (Signed)
Physical Medicine and Rehabilitation  Daily Progress Note [facility]    ASSESSMENT:     Kevin Cohen is a 70 y.o. male ??with a past medical history of metastatic prostate cancer and known bony metastases s/p chemo+RT admitted for cervical myelopathy with a neurologic level of C5 in the setting of incomplete SCI. He is now admitted to [facility] for comprehensive interdisciplinary rehabilitation.   ??  Rehab Impairment Group Code [facility]): (Spinal Cord Dysfunction - Non-Traumatic) [id number] Quadriplegia, Incomplete C5-8  Etiology: Metastatic Prostate Cancer  ??    PLAN:     REHAB:   - PT and OT to maximize functional status with mobility and ADLs as well as prevention of joint contracture.   - RT for community re-integration, education, and leisure support services.  - P&O for assistive devices PRN.  - To be discussed in weekly Interdisciplinary Team Conference.  ??  Non Traumatic, Cervical Spinal Cord Injury: Presented with several month history of symptoms concerning for cervical myelopathy and several week history of worsening LUE weakness. MRI total spine from 9/14 shows diffuse metastatic disease with epidural thickening and spinal canal stenosis from C3-5 without cord signal change, and left foraminal invasion at C5-6. Patient not surgical candidate and placed on steroid taper and radiation treatment as below.  ?? Post-operative Pain:  ? Palliative Care Consult  ? MS Contin 15 mg PO BID  ? Oxycodone 10 mg PO every 4 hours prn pain  ? Duloxetine 30 mg PO daily x 1 week and then increase to 60 mg PO daily. First dose 10/13.  ? S/p Gabapentin to 300 gm PO nightly x 1 week (10/13-10/20)  ? Tylenol 650 mg PO q4h PRN  ? Lidocaine patch, voltaren gel PRN  ?? Neuropathic Pain  ? CTM  ?? Spasticity: No issues with spasms currently. Will monitor.   ? CTM  ? Bladder:?? Patient endorses sensation and is able to void volitionally.   ? Strict I&O's  ?? Bowel:??Volitional. Admission KUB with mod stool burden.   ? Colace 100 mg BID  ? Senna 2 tabs daily with lunch  ? Bisacodyl Suppository daily PRN  ?? Equipment:  ? Place Bilateral hard PRAFO Boots to protect patient's heels from pressure injury and keep foot in neutral position to avoid PF contractures.  ?? Monitor for Autonomic Dysreflexia: Nitropaste PRN  ?? BMP and CBC every Monday/Thursday  ??  Metastatic Prostate Cancer  Previously on hospice 04/2020. Follows with Dr Philomena Course and started ADT +abi/pred. Metastatic prostate cancer with recent cord compression (T5-T6) and worsening??left arm??weakness, MRI total spine from 9/14 shows diffuse metastatic disease with epidural thickening and spinal canal stenosis from C3-5 without cord signal change, and left foraminal invasion at C5-6. SRN felt patient was not surgical candidate and patient started on radiation therapy and steroid taper.   - Rad/Onc consult  - Radiation treatment Monday-Friday (ended 10/19)  - Zytiga 1000 mg daily  - Prednisone taper: 30mg  (10/15-10/17); 20mg  (10/18-10/20);10mg  (10/21-10/23) and 5mg  (10/24-)   - Lupron 7.5mg  monthly (last dose 10/20), to f/u in clinic for q3 month injection after discharge  ??  HTN  - Amlodipine 5 mg daily  ??  HLD  - Lipitor 40 mg daily  ??  Anemia  Chronic inflammatory and iron deficiency  - Ferrous sulfate 350 mg every other day  ??  CKD  Baseline Cr ~1.0-1.2.  -BMP MTh  ??  DVT  Left peroneal and politeal veins dx 04/26/21. IVC filter placed 10/5 by VIR, needs  outpt removal in 3-6 months  - Eliquis 5mg  po bid    DISPO: Patient to be discussed at weekly interdisciplinary team conference.   - EDD: 11/1  - Follow-up: PM&R, PCP, Rad/Onc, Palliative    SUBJECTIVE:     Interval Events: NAEO. Patient continues to have discomfort from esophagitis. States he has not had relief from GI cocktail. Will reach out to radiation/oncology for any further recommendations.     OBJECTIVE:     Vital signs (last 24 hours):  Temp:  [36.4 ??C (97.5 ??F)-36.7 ??C (98.1 ??F)] 36.5 ??C (97.7 ??F)  Heart Rate:  [78-90] 78  Resp: [16-18] 18  BP: (124-134)/(67-79) 125/79  MAP (mmHg):  [87-92] 92  SpO2:  [97 %-99 %] 98 %    Intake/Output (last 3 shifts):  I/O last 3 completed shifts:  In: 200 [P.O.:200]  Out: 250 [Urine:250]    Physical Exam:   GEN: NAD, sitting in MWC at bedside  EYES: sclera anicteric, conjunctiva clear   HEENT: NCAT  NECK: trachea midline  CV: warm extremities, well perfused, no cyanosis,   RESP:  NWOB on RA  ABD: Not distended  SKIN: no visible masses, lesions, rashes, ecchymoses.   MSK: no notable contractures or ext swelling  NEURO:Alert, MAE, speech fluid and coherent, responds appropriately to questions   PSYCH: mood euthymic, affect appropriate, thought process logical         Medications:  Scheduled   ??? abiraterone (ZYTIGA) tablet 1,000 mg **Patient Supplied** Daily   ??? amLODIPine (NORVASC) tablet 5 mg Daily   ??? apixaban (ELIQUIS) tablet 5 mg BID   ??? atorvastatin (LIPITOR) tablet 40 mg Daily   ??? docusate sodium (COLACE) capsule 100 mg BID   ??? DULoxetine (CYMBALTA) DR capsule 60 mg daily   ??? ferrous sulfate tablet 325 mg Every other day   ??? leuprolide (LUPRON) injection 7.5 mg Q28 Days   ??? lidocaine (LIDODERM) 5 % patch 2 patch Daily   ??? MORPhine (MS CONTIN) 12 hr tablet 15 mg Q12H   ??? predniSONE (DELTASONE) tablet 5 mg Daily   ??? senna (SENOKOT) tablet 2 tablet Daily with lunch     PRN acetaminophen, 650 mg, Q6H PRN  albuterol, 2 puff, Q4H PRN  sucralfate, 1 g, Q6H PRN   And  lidocaine 2% viscous, 10 mL, Q6H PRN   And  aluminum-magnesium hydroxide-simethicone, 30 mL, Q6H PRN  bisacodyL, 10 mg, Daily PRN  calcium carbonate, 200 mg of elem calcium, TID PRN  guaiFENesin, 100 mg, Q4H PRN  melatonin, 3 mg, Nightly PRN  ondansetron, 4 mg, Q6H PRN  oxyCODONE, 5 mg, Q4H PRN   Or  oxyCODONE, 10 mg, Q4H PRN  polyethylene glycol, 17 g, Daily PRN  simethicone, 80 mg, TID PRN      Labs/Studies: Reviewed     Radiology Results: no new

## 2021-05-15 NOTE — Unmapped (Signed)
Patient is alert and oriented x 4. Takes medicine whole; however, due to pt oncology regimen, pt. is having pain when swallowing. Pt has refused to eat, offered less acidic and cool foods to assist; pt refused. Continent of bowel and bladder, LBM 10/22. Chemo precautions maintained. PRN medications for mucositis offered and pt refused. Pt coughing clear sputum. Scheduled Morphine and PRN oxycodone given, pt declined further intervention. Pt displayed a sad mood today; however, remained polite, friendly during shift. Skin is intact. Aspiration, skin and fall precautions in place and maintained. Hourly rounding performed by nursing staff.       Problem: Rehabilitation (IRF) Plan of Care  Goal: Plan of Care Review  05/15/2021 1535 by Fransisco Beau, RN  Outcome: Ongoing - Unchanged  05/15/2021 1535 by Fransisco Beau, RN  Outcome: Progressing  Goal: Patient-Specific Goal (Individualized)  05/15/2021 1535 by Fransisco Beau, RN  Outcome: Ongoing - Unchanged  05/15/2021 1535 by Fransisco Beau, RN  Outcome: Progressing  Goal: Absence of New-Onset Illness or Injury  05/15/2021 1535 by Fransisco Beau, RN  Outcome: Ongoing - Unchanged  05/15/2021 1535 by Fransisco Beau, RN  Outcome: Progressing  Intervention: Prevent Fall and Fall Injury  Recent Flowsheet Documentation  Taken 05/15/2021 1400 by Fransisco Beau, RN  Safety Interventions: fall reduction program maintained  Taken 05/15/2021 1200 by Fransisco Beau, RN  Safety Interventions: fall reduction program maintained  Taken 05/15/2021 1000 by Fransisco Beau, RN  Safety Interventions: fall reduction program maintained  Goal: Optimal Comfort and Wellbeing  05/15/2021 1535 by Fransisco Beau, RN  Outcome: Ongoing - Unchanged  05/15/2021 1535 by Fransisco Beau, RN  Outcome: Progressing  Goal: Home and Community Transition Plan Established  05/15/2021 1535 by Fransisco Beau, RN  Outcome: Ongoing - Unchanged  05/15/2021 1535 by Fransisco Beau, RN  Outcome: Progressing     Problem: Fall Injury Risk  Goal: Absence of Fall and Fall-Related Injury  05/15/2021 1535 by Fransisco Beau, RN  Outcome: Ongoing - Unchanged  05/15/2021 1535 by Fransisco Beau, RN  Outcome: Progressing  Intervention: Promote Injury-Free Environment  Recent Flowsheet Documentation  Taken 05/15/2021 1400 by Fransisco Beau, RN  Safety Interventions: fall reduction program maintained  Taken 05/15/2021 1200 by Fransisco Beau, RN  Safety Interventions: fall reduction program maintained  Taken 05/15/2021 1000 by Fransisco Beau, RN  Safety Interventions: fall reduction program maintained     Problem: Self-Care Deficit  Goal: Improved Ability to Complete Activities of Daily Living  05/15/2021 1535 by Fransisco Beau, RN  Outcome: Ongoing - Unchanged  05/15/2021 1535 by Fransisco Beau, RN  Outcome: Progressing     Problem: Skin Injury Risk Increased  Goal: Skin Health and Integrity  05/15/2021 1535 by Fransisco Beau, RN  Outcome: Ongoing - Unchanged  05/15/2021 1535 by Fransisco Beau, RN  Outcome: Progressing  Intervention: Optimize Skin Protection  Recent Flowsheet Documentation  Taken 05/15/2021 1400 by Fransisco Beau, RN  Pressure Reduction Techniques: frequent weight shift encouraged  Pressure Reduction Devices: chair cushion utilized  Taken 05/15/2021 1200 by Fransisco Beau, RN  Pressure Reduction Techniques: frequent weight shift encouraged  Taken 05/15/2021 1000 by Fransisco Beau, RN  Pressure Reduction Techniques: frequent weight shift encouraged  Taken 05/15/2021 0931 by Fransisco Beau, RN  Head of Bed [facility]) Positioning: HOB at 30 degrees     Problem: Hypertension Comorbidity  Goal: Blood Pressure in Desired Range  05/15/2021 1535 by Fransisco Beau, RN  Outcome: Ongoing - Unchanged  05/15/2021 1535 by Fransisco Beau, RN  Outcome: Progressing

## 2021-05-16 MED ADMIN — MORPhine (MS CONTIN) 12 hr tablet 15 mg: 15 mg | ORAL | @ 19:00:00 | Stop: 2021-05-30

## 2021-05-16 MED ADMIN — DULoxetine (CYMBALTA) DR capsule 60 mg: 60 mg | ORAL | @ 10:00:00

## 2021-05-16 MED ADMIN — predniSONE (DELTASONE) tablet 5 mg: 5 mg | ORAL | @ 14:00:00

## 2021-05-16 MED ADMIN — abiraterone (ZYTIGA) tablet 1,000 mg **Patient Supplied**: 1000 mg | ORAL | @ 14:00:00

## 2021-05-16 MED ADMIN — MORPhine (MS CONTIN) 12 hr tablet 15 mg: 15 mg | ORAL | @ 01:00:00 | Stop: 2021-05-23

## 2021-05-16 MED ADMIN — oxyCODONE (ROXICODONE) 5 mg/5 mL solution 10 mg: 10 mg | ORAL | @ 10:00:00 | Stop: 2021-05-29

## 2021-05-16 MED ADMIN — lidocaine (LIDODERM) 5 % patch 3 patch: 3 | TRANSDERMAL | @ 22:00:00

## 2021-05-16 MED ADMIN — oxyCODONE (ROXICODONE) 5 mg/5 mL solution 10 mg: 10 mg | ORAL | @ 22:00:00 | Stop: 2021-05-29

## 2021-05-16 MED ADMIN — oxyCODONE (ROXICODONE) 5 mg/5 mL solution 10 mg: 10 mg | ORAL | @ 18:00:00 | Stop: 2021-05-29

## 2021-05-16 MED ADMIN — oxyCODONE (ROXICODONE) 5 mg/5 mL solution 10 mg: 10 mg | ORAL | @ 01:00:00 | Stop: 2021-05-29

## 2021-05-16 MED ADMIN — oxyCODONE (ROXICODONE) 5 mg/5 mL solution 10 mg: 10 mg | ORAL | @ 14:00:00 | Stop: 2021-05-29

## 2021-05-16 MED ADMIN — atorvastatin (LIPITOR) tablet 40 mg: 40 mg | ORAL | @ 14:00:00

## 2021-05-16 MED ADMIN — ferrous sulfate tablet 325 mg: 325 mg | ORAL | @ 14:00:00

## 2021-05-16 MED ADMIN — docusate (COLACE) oral liquid: 100 mg | ORAL | @ 01:00:00

## 2021-05-16 MED ADMIN — amLODIPine (NORVASC) tablet 5 mg: 5 mg | ORAL | @ 14:00:00

## 2021-05-16 MED ADMIN — apixaban (ELIQUIS) tablet 5 mg: 5 mg | ORAL | @ 01:00:00

## 2021-05-16 MED ADMIN — MORPhine (MS CONTIN) 12 hr tablet 15 mg: 15 mg | ORAL | @ 14:00:00 | Stop: 2021-05-16

## 2021-05-16 MED ADMIN — apixaban (ELIQUIS) tablet 5 mg: 5 mg | ORAL | @ 14:00:00

## 2021-05-16 NOTE — Unmapped (Signed)
Patient is alert and oriented x 4. Regular diet. Takes medicine whole, refused to have them crushed. Some meds were changed to liquid, but swallowing is very painful for pt. Continent of bowel and bladder. Chemo precautions maintained. PRN mucositis refused by pt for throat pain states he has not helped very much. PRN Oxycodone given for throat and mouth pain.  Skin is intact. Aspiration, skin and fall precautions maintained. Bed in lowest position, wheels locked. Call bell and phone within reach. C/o mouth and throat pain with swallowing. Meds given at bedtime, adequate. Pt states he is able to stand pivot for transfers. Pt coughing productively.  No falls or injuries this shift. Hourly rounding ongoing. Will continue to monitor  Problem: Rehabilitation (IRF) Plan of Care  Goal: Plan of Care Review  Outcome: Ongoing - Unchanged     Problem: Rehabilitation (IRF) Plan of Care  Goal: Absence of New-Onset Illness or Injury  Outcome: Ongoing - Unchanged  Intervention: Prevent Fall and Fall Injury  Recent Flowsheet Documentation  Taken 05/15/2021 2200 by Wynelle Fanny Montgomery-Crabtree, RN  Safety Interventions:   fall reduction program maintained   aspiration precautions   nonskid shoes/slippers when out of bed  Taken 05/15/2021 2000 by Jeralyn Bennett, RN  Safety Interventions:   fall reduction program maintained   aspiration precautions   nonskid shoes/slippers when out of bed  Taken 05/15/2021 1930 by Wynelle Fanny Montgomery-Crabtree, RN  Safety Interventions:   fall reduction program maintained   nonskid shoes/slippers when out of bed   aspiration precautions  Intervention: Prevent VTE (Venous Thromboembolism)  Recent Flowsheet Documentation  Taken 05/15/2021 2000 by Jeralyn Bennett, RN  VTE Prevention/Management: anticoagulant therapy     Problem: Fall Injury Risk  Goal: Absence of Fall and Fall-Related Injury  Outcome: Ongoing - Unchanged  Intervention: Identify and Manage Contributors  Recent Flowsheet Documentation  Taken 05/15/2021 2000 by Jeralyn Bennett, RN  Self-Care Promotion: BADL personal objects within reach  Intervention: Promote Injury-Free Environment  Recent Flowsheet Documentation  Taken 05/15/2021 2200 by Wynelle Fanny Montgomery-Crabtree, RN  Safety Interventions:   fall reduction program maintained   aspiration precautions   nonskid shoes/slippers when out of bed  Taken 05/15/2021 2000 by Jeralyn Bennett, RN  Safety Interventions:   fall reduction program maintained   aspiration precautions   nonskid shoes/slippers when out of bed  Taken 05/15/2021 1930 by Wynelle Fanny Montgomery-Crabtree, RN  Safety Interventions:   fall reduction program maintained   nonskid shoes/slippers when out of bed   aspiration precautions

## 2021-05-16 NOTE — Unmapped (Signed)
Physical Medicine and Rehabilitation  Daily Progress Note [facility]    ASSESSMENT:     Kevin Cohen is a 70 y.o. male ??with a past medical history of metastatic prostate cancer and known bony metastases s/p chemo+RT admitted for cervical myelopathy with a neurologic level of C5 in the setting of incomplete SCI. He is now admitted to [facility] for comprehensive interdisciplinary rehabilitation.   ??  Rehab Impairment Group Code [facility]): (Spinal Cord Dysfunction - Non-Traumatic) [id number] Quadriplegia, Incomplete C5-8  Etiology: Metastatic Prostate Cancer    PLAN:     REHAB:   - PT and OT to maximize functional status with mobility and ADLs as well as prevention of joint contracture.   - RT for community re-integration, education, and leisure support services.  - P&O for assistive devices PRN.  - To be discussed in weekly Interdisciplinary Team Conference.  ??  Non Traumatic, Cervical Spinal Cord Injury: Presented with several month history of symptoms concerning for cervical myelopathy and several week history of worsening LUE weakness. MRI total spine from 9/14 shows diffuse metastatic disease with epidural thickening and spinal canal stenosis from C3-5 without cord signal change, and left foraminal invasion at C5-6. Patient not surgical candidate and placed on steroid taper and radiation treatment as below.  ?? Post-operative Pain:  ? Palliative Care Consult  ? MS Contin 15 mg PO BID  ? Oxycodone 10 mg PO every 4 hours prn pain  ? Duloxetine 30 mg PO daily x 1 week and then increase to 60 mg PO daily. First dose 10/13.  ? S/p Gabapentin to 300 gm PO nightly x 1 week (10/13-10/20)  ? Tylenol 650 mg PO q4h PRN  ? Lidocaine patch, voltaren gel PRN  ?? Neuropathic Pain  ? CTM  ?? Spasticity: No issues with spasms currently. Will monitor.   ? CTM  ? Bladder:?? Patient endorses sensation and is able to void volitionally.   ? Strict I&O's  ?? Bowel:??Volitional. Admission KUB with mod stool burden.   ? Colace 100 mg BID  ? Senna 2 tabs daily with lunch  ? Bisacodyl Suppository daily PRN  ?? Equipment:  ? Place Bilateral hard PRAFO Boots to protect patient's heels from pressure injury and keep foot in neutral position to avoid PF contractures.  ?? Monitor for Autonomic Dysreflexia: Nitropaste PRN  ?? BMP and CBC every Monday/Thursday  ??  Metastatic Prostate Cancer  Previously on hospice 04/2020. Follows with Dr Philomena Course and started ADT +abi/pred. Metastatic prostate cancer with recent cord compression (T5-T6) and worsening??left arm??weakness, MRI total spine from 9/14 shows diffuse metastatic disease with epidural thickening and spinal canal stenosis from C3-5 without cord signal change, and left foraminal invasion at C5-6. SRN felt patient was not surgical candidate and patient started on radiation therapy and steroid taper.   - Rad/Onc consult  - Radiation treatment Monday-Friday (ended 10/19)  - Zytiga 1000 mg daily- needs CMP every couple of weeks   [ ]  CMP for 10/26  - Prednisone taper: 30mg  (10/15-10/17); 20mg  (10/18-10/20);10mg  (10/21-10/23) and 5mg  (10/24-)   - Lupron 7.5mg  monthly (last dose 10/20), to f/u in clinic for q3 month injection after discharge  ??  HTN  - Amlodipine 5 mg daily  ??  HLD  - Lipitor 40 mg daily  ??  Anemia  Chronic inflammatory and iron deficiency  - Ferrous sulfate 350 mg every other day  ??  CKD  Baseline Cr ~1.0-1.2.  -BMP MTh  ??  DVT  Left peroneal and  politeal veins dx 04/26/21. IVC filter placed 10/5 by VIR, needs outpt removal in 3-6 months  - Eliquis 5mg  po bid    DISPO: Patient to be discussed at weekly interdisciplinary team conference.   - EDD: 11/1  - Follow-up: PM&R, PCP, Rad/Onc, Palliative    SUBJECTIVE:     Interval Events: NAEO. Still feels very uncomfortable with his esophagitis. Amenable with talking with palliative care. Feels changing the consistency of food is not going to help. Able to eat some oatmeal this morning.     OBJECTIVE:     Vital signs (last 24 hours):  Temp:  [36.5 ??C (97.7 ??F)-36.6 ??C (97.9 ??F)] 36.5 ??C (97.7 ??F)  Heart Rate:  [73] 73  Resp:  [17-18] 18  BP: (116-120)/(64-83) 120/83  MAP (mmHg):  [79-86] 86  SpO2:  [98 %-100 %] 98 %    Intake/Output (last 3 shifts):  I/O last 3 completed shifts:  In: 400 [P.O.:400]  Out: -     Physical Exam:   GEN: NAD, sitting in wheelchair at bedside  EYES: sclera anicteric, conjunctiva clear   HEENT: NCAT  NECK: trachea midline  CV: warm extremities, well perfused, no cyanosis,   RESP:  NWOB on RA  ABD: Not distended  SKIN: no visible masses, lesions, rashes, ecchymoses.   MSK: no notable contractures or ext swelling  NEURO:Alert, MAE, speech fluid and coherent, responds appropriately to questions   PSYCH: mood euthymic, affect appropriate, thought process logical         Medications:  Scheduled   ??? abiraterone (ZYTIGA) tablet 1,000 mg **Patient Supplied** Daily   ??? amLODIPine (NORVASC) tablet 5 mg Daily   ??? apixaban (ELIQUIS) tablet 5 mg BID   ??? atorvastatin (LIPITOR) tablet 40 mg Daily   ??? docusate (COLACE) oral liquid BID   ??? DULoxetine (CYMBALTA) DR capsule 60 mg daily   ??? ferrous sulfate tablet 325 mg Every other day   ??? leuprolide (LUPRON) injection 7.5 mg Q28 Days   ??? lidocaine (LIDODERM) 5 % patch 3 patch Daily   ??? MORPhine (MS CONTIN) 12 hr tablet 15 mg Q12H   ??? predniSONE (DELTASONE) tablet 5 mg Daily   ??? senna (SENOKOT) tablet 2 tablet Daily with lunch     PRN acetaminophen, 650 mg, Q6H PRN  albuterol, 2 puff, Q4H PRN  sucralfate, 1 g, Q6H PRN   And  lidocaine 2% viscous, 10 mL, Q6H PRN   And  aluminum-magnesium hydroxide-simethicone, 30 mL, Q6H PRN  bisacodyL, 10 mg, Daily PRN  calcium carbonate, 200 mg of elem calcium, TID PRN  guaiFENesin, 100 mg, Q4H PRN  melatonin, 3 mg, Nightly PRN  ondansetron, 4 mg, Q6H PRN  oxyCODONE, 5 mg, Q4H PRN   Or  oxyCODONE, 10 mg, Q4H PRN  polyethylene glycol, 17 g, Daily PRN  simethicone, 80 mg, TID PRN      Labs/Studies: Reviewed     Radiology Results: no new

## 2021-05-17 LAB — COMPREHENSIVE METABOLIC PANEL
ALBUMIN: 2.8 g/dL — ABNORMAL LOW (ref 3.4–5.0)
ALKALINE PHOSPHATASE: 555 U/L — ABNORMAL HIGH (ref 46–116)
ALT (SGPT): <7 U/L — ABNORMAL LOW (ref 10–49)
ANION GAP: 8 mmol/L (ref 5–14)
AST (SGOT): 19 U/L (ref <=34)
BILIRUBIN TOTAL: 0.4 mg/dL (ref 0.3–1.2)
BLOOD UREA NITROGEN: 33 mg/dL — ABNORMAL HIGH (ref 9–23)
BUN / CREAT RATIO: 28
CALCIUM: 9.9 mg/dL (ref 8.7–10.4)
CHLORIDE: 104 mmol/L (ref 98–107)
CO2: 30.2 mmol/L (ref 20.0–31.0)
CREATININE: 1.2 mg/dL — ABNORMAL HIGH
EGFR CKD-EPI (2021) MALE: 66 mL/min/{1.73_m2} (ref >=60)
GLUCOSE RANDOM: 103 mg/dL (ref 70–179)
POTASSIUM: 3.9 mmol/L (ref 3.4–4.8)
PROTEIN TOTAL: 5.2 g/dL — ABNORMAL LOW (ref 5.7–8.2)
SODIUM: 142 mmol/L (ref 135–145)

## 2021-05-17 MED ADMIN — sucralfate (CARAFATE) oral suspension: 1 g | ORAL | @ 01:00:00

## 2021-05-17 MED ADMIN — sucralfate (CARAFATE) oral suspension: 1 g | ORAL | @ 12:00:00

## 2021-05-17 MED ADMIN — DULoxetine (CYMBALTA) DR capsule 60 mg: 60 mg | ORAL | @ 10:00:00

## 2021-05-17 MED ADMIN — atorvastatin (LIPITOR) tablet 40 mg: 40 mg | ORAL | @ 12:00:00

## 2021-05-17 MED ADMIN — MORPhine (MS CONTIN) 12 hr tablet 15 mg: 15 mg | ORAL | @ 18:00:00 | Stop: 2021-05-30

## 2021-05-17 MED ADMIN — aluminum-magnesium hydroxide-simethicone (MAALOX MAX) 80-80-8 mg/mL oral suspension: 30 mL | ORAL | @ 12:00:00

## 2021-05-17 MED ADMIN — MORPhine (MS CONTIN) 12 hr tablet 15 mg: 15 mg | ORAL | @ 10:00:00 | Stop: 2021-05-30

## 2021-05-17 MED ADMIN — abiraterone (ZYTIGA) tablet 1,000 mg **Patient Supplied**: 1000 mg | ORAL | @ 12:00:00

## 2021-05-17 MED ADMIN — apixaban (ELIQUIS) tablet 5 mg: 5 mg | ORAL | @ 01:00:00

## 2021-05-17 MED ADMIN — MORPhine (MS CONTIN) 12 hr tablet 15 mg: 15 mg | ORAL | @ 01:00:00 | Stop: 2021-05-30

## 2021-05-17 MED ADMIN — lidocaine (XYLOCAINE) 2% viscous mucosal solution: 10 mL | ORAL | @ 12:00:00

## 2021-05-17 MED ADMIN — oxyCODONE (ROXICODONE) 5 mg/5 mL solution 10 mg: 10 mg | ORAL | @ 19:00:00 | Stop: 2021-05-29

## 2021-05-17 MED ADMIN — predniSONE (DELTASONE) tablet 5 mg: 5 mg | ORAL | @ 12:00:00

## 2021-05-17 MED ADMIN — lidocaine (LIDODERM) 5 % patch 3 patch: 3 | TRANSDERMAL | @ 12:00:00

## 2021-05-17 MED ADMIN — apixaban (ELIQUIS) tablet 5 mg: 5 mg | ORAL | @ 12:00:00

## 2021-05-17 MED ADMIN — amLODIPine (NORVASC) tablet 5 mg: 5 mg | ORAL | @ 12:00:00

## 2021-05-17 NOTE — Unmapped (Signed)
Patient A&O x 4, continent of bowel and bladder. Skin intact. Swallow with pain. Neck and shoulder pain manage with Q4 PRN oxycodone. Call bell and phone within reach.  Problem: Rehabilitation (IRF) Plan of Care  Goal: Plan of Care Review  Outcome: Progressing  Goal: Patient-Specific Goal (Individualized)  Outcome: Progressing  Goal: Absence of New-Onset Illness or Injury  Outcome: Progressing  Intervention: Prevent Fall and Fall Injury  Recent Flowsheet Documentation  Taken 05/17/2021 1600 by Lewis Moccasin, RN BSN  Safety Interventions: fall reduction program maintained  Taken 05/17/2021 1400 by Lewis Moccasin, RN BSN  Safety Interventions: fall reduction program maintained  Taken 05/17/2021 1201 by Lewis Moccasin, RN BSN  Safety Interventions:   fall reduction program maintained   family at bedside  Taken 05/17/2021 0800 by Lewis Moccasin, RN BSN  Safety Interventions:   fall reduction program maintained   low bed   nonskid shoes/slippers when out of bed  Intervention: Prevent VTE (Venous Thromboembolism)  Recent Flowsheet Documentation  Taken 05/17/2021 0800 by Lewis Moccasin, RN BSN  VTE Prevention/Management: anticoagulant therapy  Goal: Optimal Comfort and Wellbeing  Outcome: Progressing  Goal: Home and Community Transition Plan Established  Outcome: Progressing     Problem: Fall Injury Risk  Goal: Absence of Fall and Fall-Related Injury  Outcome: Progressing  Intervention: Promote Injury-Free Environment  Recent Flowsheet Documentation  Taken 05/17/2021 1600 by Lewis Moccasin, RN BSN  Safety Interventions: fall reduction program maintained  Taken 05/17/2021 1400 by Lewis Moccasin, RN BSN  Safety Interventions: fall reduction program maintained  Taken 05/17/2021 1201 by Lewis Moccasin, RN BSN  Safety Interventions:   fall reduction program maintained   family at bedside  Taken 05/17/2021 0800 by Lewis Moccasin, RN BSN  Safety Interventions:   fall reduction program maintained   low bed   nonskid shoes/slippers when out of bed     Problem: Self-Care Deficit  Goal: Improved Ability to Complete Activities of Daily Living  Outcome: Progressing     Problem: Skin Injury Risk Increased  Goal: Skin Health and Integrity  Outcome: Progressing     Problem: Hypertension Comorbidity  Goal: Blood Pressure in Desired Range  Outcome: Progressing

## 2021-05-17 NOTE — Unmapped (Signed)
Patient is alert and oriented x 4. Takes medicine whole; however, due to pt oncology regimen, pt. is having pain when swallowing. Pt has refused to eat, offered less acidic and cool foods to assist; pt refused. Continent of bowel and bladder, LBM 10/24. Chemo precautions maintained. PRN medications for mucositis offered and pt refused. Pt coughing clear sputum. Scheduled Morphine and PRN oxycodone given, pt declined further intervention. Pt displayed a sad mood today; however, remained polite, friendly during shift. Skin is intact. Aspiration, skin and fall precautions in place and maintained. Hourly rounding performed by nursing staff.       Problem: Rehabilitation (IRF) Plan of Care  Goal: Plan of Care Review  Outcome: Progressing  Goal: Patient-Specific Goal (Individualized)  Outcome: Progressing  Goal: Absence of New-Onset Illness or Injury  Outcome: Progressing  Intervention: Prevent Fall and Fall Injury  Recent Flowsheet Documentation  Taken 05/16/2021 1000 by Fransisco Beau, RN  Safety Interventions:   fall reduction program maintained   chemotherapeutic agent precautions  Taken 05/16/2021 0800 by Fransisco Beau, RN  Safety Interventions:   fall reduction program maintained   chemotherapeutic agent precautions  Goal: Optimal Comfort and Wellbeing  Outcome: Progressing  Goal: Home and Community Transition Plan Established  Outcome: Progressing     Problem: Fall Injury Risk  Goal: Absence of Fall and Fall-Related Injury  Outcome: Progressing  Intervention: Promote Injury-Free Environment  Recent Flowsheet Documentation  Taken 05/16/2021 1000 by Fransisco Beau, RN  Safety Interventions:   fall reduction program maintained   chemotherapeutic agent precautions  Taken 05/16/2021 0800 by Fransisco Beau, RN  Safety Interventions:   fall reduction program maintained   chemotherapeutic agent precautions     Problem: Self-Care Deficit  Goal: Improved Ability to Complete Activities of Daily Living  Outcome: Progressing Problem: Skin Injury Risk Increased  Goal: Skin Health and Integrity  Outcome: Progressing  Intervention: Optimize Skin Protection  Recent Flowsheet Documentation  Taken 05/16/2021 1000 by Fransisco Beau, RN  Pressure Reduction Techniques: frequent weight shift encouraged  Taken 05/16/2021 0930 by Fransisco Beau, RN  Head of Bed [facility]) Positioning: HOB at 30 degrees  Taken 05/16/2021 0900 by Fransisco Beau, RN  Pressure Reduction Techniques: frequent weight shift encouraged  Taken 05/16/2021 0800 by Fransisco Beau, RN  Pressure Reduction Techniques: frequent weight shift encouraged     Problem: Hypertension Comorbidity  Goal: Blood Pressure in Desired Range  Outcome: Progressing

## 2021-05-17 NOTE — Unmapped (Signed)
Physical Medicine and Rehabilitation  Daily Progress Note [facility]    ASSESSMENT:     Kevin Cohen is a 70 y.o. male ??with a past medical history of metastatic prostate cancer and known bony metastases s/p chemo+RT admitted for cervical myelopathy with a neurologic level of C5 in the setting of incomplete SCI. He is now admitted to [facility] for comprehensive interdisciplinary rehabilitation.   ??  Rehab Impairment Group Code [facility]): (Spinal Cord Dysfunction - Non-Traumatic) [id number] Quadriplegia, Incomplete C5-8  Etiology: Metastatic Prostate Cancer    PLAN:     REHAB:   - PT and OT to maximize functional status with mobility and ADLs as well as prevention of joint contracture.   - RT for community re-integration, education, and leisure support services.  - P&O for assistive devices PRN.  - To be discussed in weekly Interdisciplinary Team Conference.  ??  Non Traumatic, Cervical Spinal Cord Injury: Presented with several month history of symptoms concerning for cervical myelopathy and several week history of worsening LUE weakness. MRI total spine from 9/14 shows diffuse metastatic disease with epidural thickening and spinal canal stenosis from C3-5 without cord signal change, and left foraminal invasion at C5-6. Patient not surgical candidate and placed on steroid taper and radiation treatment as below.  ?? Post-operative Pain:  ? Palliative Care Consult  ? MS Contin 15 mg PO TID  ? Oxycodone 10 mg PO every 4 hours prn pain  ? Duloxetine 30 mg PO daily x 1 week and then increase to 60 mg PO daily. First dose 10/13.  ? S/p Gabapentin to 300 gm PO nightly x 1 week (10/13-10/20)  ? Tylenol 650 mg PO q4h PRN  ? Lidocaine patch, voltaren gel PRN  ?? Neuropathic Pain  ? CTM  ?? Spasticity: No issues with spasms currently. Will monitor.   ? CTM  ? Bladder:?? Patient endorses sensation and is able to void volitionally.   ? Strict I&O's  ?? Bowel:??Volitional. Admission KUB with mod stool burden.   ? Colace 100 mg BID  ? Senna 2 tabs daily with lunch  ? Bisacodyl Suppository daily PRN  ?? Equipment:  ? Place Bilateral hard PRAFO Boots to protect patient's heels from pressure injury and keep foot in neutral position to avoid PF contractures.  ?? Monitor for Autonomic Dysreflexia: Nitropaste PRN  ?? BMP and CBC every Monday/Thursday  ??  Metastatic Prostate Cancer  Previously on hospice 04/2020. Follows with Dr Philomena Course and started ADT +abi/pred. Metastatic prostate cancer with recent cord compression (T5-T6) and worsening??left arm??weakness, MRI total spine from 9/14 shows diffuse metastatic disease with epidural thickening and spinal canal stenosis from C3-5 without cord signal change, and left foraminal invasion at C5-6. SRN felt patient was not surgical candidate and patient started on radiation therapy and steroid taper.   - Rad/Onc consult  - Radiation treatment Monday-Friday (ended 10/19)  - Zytiga 1000 mg daily- needs CMP every couple of weeks   [ ]  CMP for 10/26  - Prednisone taper: 30mg  (10/15-10/17); 20mg  (10/18-10/20);10mg  (10/21-10/23) and 5mg  (10/24-)   - Lupron 7.5mg  monthly (last dose 10/20), to f/u in clinic for q3 month injection after discharge  ??  HTN  - Amlodipine 5 mg daily  ??  HLD  - Lipitor 40 mg daily  ??  Anemia  Chronic inflammatory and iron deficiency  - Ferrous sulfate 350 mg every other day  ??  CKD  Baseline Cr ~1.0-1.2.  -BMP MTh  ??  DVT  Left peroneal and  politeal veins dx 04/26/21. IVC filter placed 10/5 by [facility], needs outpt removal in 3-6 months  - Eliquis 5mg  po bid    DISPO: Patient to be discussed at weekly interdisciplinary team conference.   - EDD: 11/1  - Follow-up: PM&R, PCP, Rad/Onc, Palliative    SUBJECTIVE:     Interval Events: NAEO. Continues to note discomfort from esophagitis. Switched MS Contin to TID per palliative recommendation. Has been able to eat oatmeal and apple sauce which has been okay for him. No further complaints or questions today.     OBJECTIVE:     Vital signs (last 24 hours):  Temp: [36.4 ??C (97.5 ??F)-37.1 ??C (98.8 ??F)] 37.1 ??C (98.8 ??F)  Heart Rate:  [72-78] 76  Resp:  [14-16] 14  BP: (106-127)/(54-65) 106/59  MAP (mmHg):  [72-83] 76  SpO2:  [98 %-100 %] 98 %    Intake/Output (last 3 shifts):  I/O last 3 completed shifts:  In: 200 [P.O.:200]  Out: -     Physical Exam:   GEN: NAD, sitting in wheelchair at bedside  EYES: sclera anicteric, conjunctiva clear   HEENT: NCAT  NECK: trachea midline  CV: warm extremities, well perfused, no cyanosis,   RESP:  NWOB on RA  ABD: Not distended  SKIN: no visible masses, lesions, rashes, ecchymoses.   MSK: no notable contractures or ext swelling  NEURO:Alert, MAE, speech fluid and coherent, responds appropriately to questions   PSYCH: mood euthymic, affect appropriate, thought process logical         Medications:  Scheduled   ??? abiraterone (ZYTIGA) tablet 1,000 mg **Patient Supplied** Daily   ??? lidocaine (XYLOCAINE) 2% viscous mucosal solution BID    And   ??? sucralfate (CARAFATE) oral suspension BID    And   ??? aluminum-magnesium hydroxide-simethicone (MAALOX MAX) 80-80-8 mg/mL oral suspension BID   ??? amLODIPine (NORVASC) tablet 5 mg Daily   ??? apixaban (ELIQUIS) tablet 5 mg BID   ??? atorvastatin (LIPITOR) tablet 40 mg Daily   ??? docusate (COLACE) oral liquid BID   ??? DULoxetine (CYMBALTA) DR capsule 60 mg daily   ??? ferrous sulfate tablet 325 mg Every other day   ??? leuprolide (LUPRON) injection 7.5 mg Q28 Days   ??? lidocaine (LIDODERM) 5 % patch 3 patch Daily   ??? MORPhine (MS CONTIN) 12 hr tablet 15 mg Q8H SCH   ??? predniSONE (DELTASONE) tablet 5 mg Daily   ??? senna (SENOKOT) tablet 2 tablet Daily with lunch     PRN acetaminophen, 650 mg, Q6H PRN  albuterol, 2 puff, Q4H PRN  lidocaine 2% viscous, 10 mL, Q4H PRN   And  sucralfate, 1 g, Q4H PRN   And  aluminum-magnesium hydroxide-simethicone, 30 mL, Q4H PRN  bisacodyL, 10 mg, Daily PRN  calcium carbonate, 200 mg of elem calcium, TID PRN  guaiFENesin, 100 mg, Q4H PRN  melatonin, 3 mg, Nightly PRN  ondansetron, 4 mg, Q6H PRN  oxyCODONE, 5 mg, Q4H PRN   Or  oxyCODONE, 10 mg, Q4H PRN  polyethylene glycol, 17 g, Daily PRN  simethicone, 80 mg, TID PRN      Labs/Studies: Reviewed     Radiology Results: no new

## 2021-05-17 NOTE — Unmapped (Signed)
Patient is alert and oriented. C/o pain when swallowing any type of liquid or food. Non-liquid medications given in applesauce and crushed if possible. Refused Maalox and Lidocaine despite education provided. Cont of Bowel and bladder. Awake for most of the shift. Falls/safety precautions in place. Will continue to monitor.      Problem: Fall Injury Risk  Goal: Absence of Fall and Fall-Related Injury  Outcome: Progressing  Intervention: Promote Injury-Free Environment  Recent Flowsheet Documentation  Taken 05/17/2021 0200 by Kathaleen Maser, RN  Safety Interventions: fall reduction program maintained  Taken 05/17/2021 0000 by Kathaleen Maser, RN  Safety Interventions: fall reduction program maintained  Taken 05/16/2021 2200 by Kathaleen Maser, RN  Safety Interventions: fall reduction program maintained  Taken 05/16/2021 2000 by Kathaleen Maser, RN  Safety Interventions: fall reduction program maintained     Problem: Self-Care Deficit  Goal: Improved Ability to Complete Activities of Daily Living  Outcome: Progressing     Problem: Skin Injury Risk Increased  Goal: Skin Health and Integrity  Outcome: Progressing  Intervention: Optimize Skin Protection  Recent Flowsheet Documentation  Taken 05/17/2021 0200 by Kathaleen Maser, RN  Pressure Reduction Techniques: frequent weight shift encouraged  Taken 05/17/2021 0000 by Kathaleen Maser, RN  Pressure Reduction Techniques: frequent weight shift encouraged  Taken 05/16/2021 2200 by Kathaleen Maser, RN  Pressure Reduction Techniques: frequent weight shift encouraged  Taken 05/16/2021 2000 by Kathaleen Maser, RN  Pressure Reduction Techniques: frequent weight shift encouraged

## 2021-05-18 LAB — CBC
HEMATOCRIT: 21.6 % — ABNORMAL LOW (ref 39.0–48.0)
HEMOGLOBIN: 7.1 g/dL — ABNORMAL LOW (ref 12.9–16.5)
MEAN CORPUSCULAR HEMOGLOBIN CONC: 32.7 g/dL (ref 32.0–36.0)
MEAN CORPUSCULAR HEMOGLOBIN: 30.8 pg (ref 25.9–32.4)
MEAN CORPUSCULAR VOLUME: 94.3 fL (ref 77.6–95.7)
MEAN PLATELET VOLUME: 7.5 fL (ref 6.8–10.7)
PLATELET COUNT: 88 10*9/L — ABNORMAL LOW (ref 150–450)
RED BLOOD CELL COUNT: 2.29 10*12/L — ABNORMAL LOW (ref 4.26–5.60)
RED CELL DISTRIBUTION WIDTH: 19.2 % — ABNORMAL HIGH (ref 12.2–15.2)
WBC ADJUSTED: 2.6 10*9/L — ABNORMAL LOW (ref 3.6–11.2)

## 2021-05-18 LAB — BASIC METABOLIC PANEL
ANION GAP: 8 mmol/L (ref 5–14)
BLOOD UREA NITROGEN: 29 mg/dL — ABNORMAL HIGH (ref 9–23)
BUN / CREAT RATIO: 26
CALCIUM: 9.8 mg/dL (ref 8.7–10.4)
CHLORIDE: 105 mmol/L (ref 98–107)
CO2: 29.4 mmol/L (ref 20.0–31.0)
CREATININE: 1.13 mg/dL — ABNORMAL HIGH
EGFR CKD-EPI (2021) MALE: 71 mL/min/{1.73_m2} (ref >=60)
GLUCOSE RANDOM: 104 mg/dL (ref 70–179)
POTASSIUM: 3.8 mmol/L (ref 3.4–4.8)
SODIUM: 142 mmol/L (ref 135–145)

## 2021-05-18 LAB — RETICULOCYTES
RETICULOCYTE ABSOLUTE COUNT: 34.3 10*9/L (ref 23.0–100.0)
RETICULOCYTE COUNT PCT: 1.51 % (ref 0.50–2.17)

## 2021-05-18 LAB — FOLATE: FOLATE: 18.5 ng/mL (ref >=5.4)

## 2021-05-18 MED ADMIN — DULoxetine (CYMBALTA) DR capsule 60 mg: 60 mg | ORAL | @ 10:00:00

## 2021-05-18 MED ADMIN — abiraterone (ZYTIGA) tablet 1,000 mg **Patient Supplied**: 1000 mg | ORAL | @ 13:00:00

## 2021-05-18 MED ADMIN — sucralfate (CARAFATE) oral suspension: 1 g | ORAL | @ 14:00:00

## 2021-05-18 MED ADMIN — amLODIPine (NORVASC) tablet 5 mg: 5 mg | ORAL | @ 13:00:00

## 2021-05-18 MED ADMIN — apixaban (ELIQUIS) tablet 5 mg: 5 mg | ORAL | @ 01:00:00

## 2021-05-18 MED ADMIN — MORPhine (MS CONTIN) 12 hr tablet 15 mg: 15 mg | ORAL | @ 01:00:00 | Stop: 2021-05-30

## 2021-05-18 MED ADMIN — MORPhine (MS CONTIN) 12 hr tablet 15 mg: 15 mg | ORAL | @ 19:00:00 | Stop: 2021-05-30

## 2021-05-18 MED ADMIN — oxyCODONE (ROXICODONE) 5 mg/5 mL solution 10 mg: 10 mg | ORAL | @ 20:00:00 | Stop: 2021-05-29

## 2021-05-18 MED ADMIN — atorvastatin (LIPITOR) tablet 40 mg: 40 mg | ORAL | @ 12:00:00

## 2021-05-18 MED ADMIN — lidocaine (LIDODERM) 5 % patch 3 patch: 3 | TRANSDERMAL | @ 13:00:00

## 2021-05-18 MED ADMIN — aluminum-magnesium hydroxide-simethicone (MAALOX MAX) 80-80-8 mg/mL oral suspension: 30 mL | ORAL | @ 13:00:00

## 2021-05-18 MED ADMIN — sucralfate (CARAFATE) oral suspension: 1 g | ORAL | @ 01:00:00

## 2021-05-18 MED ADMIN — predniSONE (DELTASONE) tablet 5 mg: 5 mg | ORAL | @ 12:00:00

## 2021-05-18 MED ADMIN — apixaban (ELIQUIS) tablet 5 mg: 5 mg | ORAL | @ 13:00:00

## 2021-05-18 MED ADMIN — ferrous sulfate tablet 325 mg: 325 mg | ORAL | @ 12:00:00

## 2021-05-18 MED ADMIN — MORPhine (MS CONTIN) 12 hr tablet 15 mg: 15 mg | ORAL | @ 10:00:00 | Stop: 2021-05-30

## 2021-05-18 MED ADMIN — aluminum-magnesium hydroxide-simethicone (MAALOX MAX) 80-80-8 mg/mL oral suspension: 30 mL | ORAL | @ 01:00:00

## 2021-05-18 MED ADMIN — lidocaine (XYLOCAINE) 2% viscous mucosal solution: 10 mL | ORAL | @ 13:00:00

## 2021-05-18 NOTE — Unmapped (Signed)
Patient A&O x 4, continent of bowel and bladder. Skin intact. Pain with swallowing.  Neck and shoulder pain manage with Q4 PRN oxycodone. Call bell and phone within reach.  Problem: Rehabilitation (IRF) Plan of Care  Goal: Plan of Care Review  Outcome: Progressing  Goal: Patient-Specific Goal (Individualized)  Outcome: Progressing  Goal: Absence of New-Onset Illness or Injury  Outcome: Progressing  Intervention: Prevent Fall and Fall Injury  Recent Flowsheet Documentation  Taken 05/18/2021 1400 by Lewis Moccasin, RN BSN  Safety Interventions: fall reduction program maintained  Taken 05/18/2021 1200 by Lewis Moccasin, RN BSN  Safety Interventions: fall reduction program maintained  Taken 05/18/2021 1000 by Lewis Moccasin, RN BSN  Safety Interventions: fall reduction program maintained  Taken 05/18/2021 0800 by Lewis Moccasin, RN BSN  Safety Interventions:   fall reduction program maintained   low bed   nonskid shoes/slippers when out of bed  Intervention: Prevent VTE (Venous Thromboembolism)  Recent Flowsheet Documentation  Taken 05/18/2021 0900 by Lewis Moccasin, RN BSN  VTE Prevention/Management: anticoagulant therapy  Goal: Optimal Comfort and Wellbeing  Outcome: Progressing  Goal: Home and Community Transition Plan Established  Outcome: Progressing     Problem: Fall Injury Risk  Goal: Absence of Fall and Fall-Related Injury  Outcome: Progressing  Intervention: Promote Injury-Free Environment  Recent Flowsheet Documentation  Taken 05/18/2021 1400 by Lewis Moccasin, RN BSN  Safety Interventions: fall reduction program maintained  Taken 05/18/2021 1200 by Lewis Moccasin, RN BSN  Safety Interventions: fall reduction program maintained  Taken 05/18/2021 1000 by Lewis Moccasin, RN BSN  Safety Interventions: fall reduction program maintained  Taken 05/18/2021 0800 by Lewis Moccasin, RN BSN  Safety Interventions:   fall reduction program maintained   low bed   nonskid shoes/slippers when out of bed     Problem: Self-Care Deficit  Goal: Improved Ability to Complete Activities of Daily Living  Outcome: Progressing     Problem: Skin Injury Risk Increased  Goal: Skin Health and Integrity  Outcome: Progressing     Problem: Hypertension Comorbidity  Goal: Blood Pressure in Desired Range  Outcome: Progressing

## 2021-05-18 NOTE — Unmapped (Signed)
Physical Medicine and Rehabilitation  Daily Progress Note [facility]    ASSESSMENT:     Kevin Cohen is a 71 y.o. male ??with a past medical history of metastatic prostate cancer and known bony metastases s/p chemo+RT admitted for cervical myelopathy with a neurologic level of C5 in the setting of incomplete SCI. He is now admitted to [facility] for comprehensive interdisciplinary rehabilitation.   ??  Rehab Impairment Group Code [facility]): (Spinal Cord Dysfunction - Non-Traumatic) [id number] Quadriplegia, Incomplete C5-8  Etiology: Metastatic Prostate Cancer    PLAN:     REHAB:   - PT and OT to maximize functional status with mobility and ADLs as well as prevention of joint contracture.   - RT for community re-integration, education, and leisure support services.  - P&O for assistive devices PRN.  - To be discussed in weekly Interdisciplinary Team Conference.  ??  Non Traumatic, Cervical Spinal Cord Injury: Presented with several month history of symptoms concerning for cervical myelopathy and several week history of worsening LUE weakness. MRI total spine from 9/14 shows diffuse metastatic disease with epidural thickening and spinal canal stenosis from C3-5 without cord signal change, and left foraminal invasion at C5-6. Patient not surgical candidate and placed on steroid taper and radiation treatment as below.  ?? Post-operative Pain:  ? Palliative Care Consult  ? MS Contin 15 mg PO TID  ? Oxycodone 10 mg PO every 4 hours prn pain  ? Duloxetine 30 mg PO daily x 1 week and then increase to 60 mg PO daily. First dose 10/13.  ? S/p Gabapentin to 300 gm PO nightly x 1 week (10/13-10/20)  ? Tylenol 650 mg PO q4h PRN  ? Lidocaine patch, voltaren gel PRN  ?? Neuropathic Pain  ? CTM  ?? Spasticity: No issues with spasms currently. Will monitor.   ? CTM  ? Bladder:?? Patient endorses sensation and is able to void volitionally.   ? Strict I&O's  ?? Bowel:??Volitional. Admission KUB with mod stool burden.   ? Colace 100 mg BID PRN  ? Miralax PRN  ? Bisacodyl Suppository daily PRN  ?? Equipment:  ? Place Bilateral hard PRAFO Boots to protect patient's heels from pressure injury and keep foot in neutral position to avoid PF contractures.  ?? Monitor for Autonomic Dysreflexia: Nitropaste PRN  ?? BMP and CBC every Monday/Thursday  ??  Metastatic Prostate Cancer  Previously on hospice 04/2020. Follows with Dr Philomena Course and started ADT +abi/pred. Metastatic prostate cancer with recent cord compression (T5-T6) and worsening??left arm??weakness, MRI total spine from 9/14 shows diffuse metastatic disease with epidural thickening and spinal canal stenosis from C3-5 without cord signal change, and left foraminal invasion at C5-6. SRN felt patient was not surgical candidate and patient started on radiation therapy and steroid taper.   - Rad/Onc consult  - Radiation treatment Monday-Friday (ended 10/19)  - Zytiga 1000 mg daily- needs CMP every couple of weeks   - Prednisone taper: 30mg  (10/15-10/17); 20mg  (10/18-10/20);10mg  (10/21-10/23) and 5mg  (10/24-)   - Lupron 7.5mg  monthly (last dose 10/20), to f/u in clinic for q3 month injection after discharge  ??  HTN  - Amlodipine 5 mg daily  ??  HLD  - Lipitor 40 mg daily  ??  Pancytopenia  Chronic inflammatory and iron deficiency. Noted to have slow down trending blood cell counts. On 10/27 Plt-88, Hgb-7.1, WBC-2.6. Will contact oncology for any further evaluation.   - Oncology consult  - Ferrous sulfate 350 mg every other day  ??  CKD  Baseline Cr ~1.0-1.2.  -BMP MTh  ??  DVT  Left peroneal and politeal veins dx 04/26/21. IVC filter placed 10/5 by [facility], needs outpt removal in 3-6 months  - Eliquis 5mg  po bid    DISPO: Patient to be discussed at weekly interdisciplinary team conference.   - EDD: 11/1  - Follow-up: PM&R, PCP, Rad/Onc, Palliative    SUBJECTIVE:     Interval Events: NAEO. Patient states he is doing well this morning. Esophagitis discomfort comes and goes, states that he has been able to increase PO intake. Has slowly down trending pancytopenia, will reach out to oncology for any further evaluation. Denies any blood is stool or other signs of bleeding.     OBJECTIVE:     Vital signs (last 24 hours):  Temp:  [36.6 ??C (97.9 ??F)-37.1 ??C (98.8 ??F)] 36.6 ??C (97.9 ??F)  Heart Rate:  [68-78] 78  Resp:  [14-16] 16  BP: (106-114)/(59-67) 106/67  MAP (mmHg):  [76-80] 80  SpO2:  [97 %-98 %] 97 %    Intake/Output (last 3 shifts):  No intake/output data recorded.    Physical Exam:   GEN: NAD, sitting in bed eating breakfast  EYES: sclera anicteric, conjunctiva clear   HEENT: NCAT  NECK: trachea midline  CV: warm extremities, well perfused, no cyanosis,   RESP:  NWOB on RA  ABD: Not distended  SKIN: no visible masses, lesions, rashes, ecchymoses.   MSK: no notable contractures or ext swelling  NEURO:Alert, MAE, speech fluid and coherent, responds appropriately to questions   PSYCH: mood euthymic, affect appropriate, thought process logical         Medications:  Scheduled   ??? abiraterone (ZYTIGA) tablet 1,000 mg **Patient Supplied** Daily   ??? lidocaine (XYLOCAINE) 2% viscous mucosal solution BID    And   ??? sucralfate (CARAFATE) oral suspension BID    And   ??? aluminum-magnesium hydroxide-simethicone (MAALOX MAX) 80-80-8 mg/mL oral suspension BID   ??? amLODIPine (NORVASC) tablet 5 mg Daily   ??? apixaban (ELIQUIS) tablet 5 mg BID   ??? atorvastatin (LIPITOR) tablet 40 mg Daily   ??? DULoxetine (CYMBALTA) DR capsule 60 mg daily   ??? ferrous sulfate tablet 325 mg Every other day   ??? leuprolide (LUPRON) injection 7.5 mg Q28 Days   ??? lidocaine (LIDODERM) 5 % patch 3 patch Daily   ??? MORPhine (MS CONTIN) 12 hr tablet 15 mg Q8H SCH   ??? predniSONE (DELTASONE) tablet 5 mg Daily     PRN acetaminophen, 650 mg, Q6H PRN  albuterol, 2 puff, Q4H PRN  lidocaine 2% viscous, 10 mL, Q4H PRN   And  sucralfate, 1 g, Q4H PRN   And  aluminum-magnesium hydroxide-simethicone, 30 mL, Q4H PRN  bisacodyL, 10 mg, Daily PRN  calcium carbonate, 200 mg of elem calcium, TID PRN  guaiFENesin, 100 mg, Q4H PRN  melatonin, 3 mg, Nightly PRN  ondansetron, 4 mg, Q6H PRN  oxyCODONE, 5 mg, Q4H PRN   Or  oxyCODONE, 10 mg, Q4H PRN  polyethylene glycol, 17 g, Daily PRN  simethicone, 80 mg, TID PRN      Labs/Studies: Reviewed     Radiology Results: no new

## 2021-05-18 NOTE — Unmapped (Signed)
Oncology Consults    Thank you for your Inpatient E-Consult.    To summarize: Kevin Cohen is a 70 y.o. man with PMH HTN, HLD, and metastatic prostate cancer with recent cord compression (T5-T6) and worsening left arm weakness, MRI total spine from 9/14 shows diffuse metastatic disease with epidural thickening and spinal canal stenosis from C3-5 without cord signal change, and left foraminal invasion at C5-6. On hospice 04/2020 - until recently when established with Dr Philomena Course and started ADT +abi/pred. Admitted for NSG intervention but was determined to not be a surgical candidate. Radiation oncology was consulted and has since completed 30Gy/51fx to C-spine (10/6-10/19/22)    Course has also been complicated by acute L popliteal and peroneal vein DVTs and was started on Eliquis; additionally  underwent IVC filter placement 04/26/21. Developed esophagitis which has worsened after radiation treatments, managed currently with magic mouthwash and analgesics including opioids.    Discharged to [facility] on 05/06/21, and has been at there since. Received lurpon 7.5mg  on 05/11/21 (every 28d) Has continued prednisone 5mg  qday and abiraterone 1000mg  qday.    After 05/06/21, labs have demonstrated a dip in his cell counts, with WBC downtrending from 5.2 to 2.6, Hgb downtrending from 8.1 to 7.1, platelets from 122 to 88. No differential available. Oncology initially noted in their consult note 04/26/21 a history of anemia of chronic inflammation and stable thrombocytopenia in the low 100s, with B12 wnl and iron studies, also raising the possibility of bone marrow invasion from prostate cancer but considered to be less likely given stability of his thrombocytopenia; at the time PSA was 983 from 1154 the month prior.    Etiologies of pancytopenia could include low production due to nutritional deficiencies (B12 wnl 04/2021, iron studies c/w ACD, folate level not checked) vs bone marrow infiltrative process (though PSA downtrending as of 04/27/21) vs less likely recent radiation therapy vs less likely destructive process (TBili wnl 05/17/21). Unlikely that his ongoing treatment for prostate cancer is causing myelosuppression.    My recommendations are as follows:   - Please obtain differential and reticulocyte count, agree with repeat CBCPD tomorrow  - Please obtain a peripheral smear for pathology review  - Please obtain folate level  - Please obtain LDH, haptoglobin.  - Please obtain a repeat PSA level    I spent 21-25 minutes in medical consultative discussion and review of medical records, including a written report to the treating provider via electronic health record regarding the condition of this patient.    I spent over >50% of the service in medical consultative verbal or Internet discussion.     This Inpatient E-Consult did include an answerable clinical question and did not recommend an in person consult.    My recommendation was to continue care locally.    I communicated this information to the requesting provider via a verbal and written report.      Modesto Charon, MD   Hematology/Oncology, PGY-4      This Inpatient E-Consult is based solely on the clinical information available to me in the patient's medical record and is provided without benefit of a comprehensive evaluation or physical examination of the patient. The information contained in this Inpatient E-Consult must be interpreted in light of any clinical issues or changes in patient status that were not known to me at the time the Inpatient E-Consult was completed. You must rely on your own informed clinical judgement for decision making. If necessary, you may request an in person consult with  the patient during hospitalization. Please contact me if you have further questions.

## 2021-05-18 NOTE — Unmapped (Signed)
Patient is alert and oriented. C/o pain when swallowing any type of liquid or food. Non-liquid medications given in applesauce and crushed if possible. Refused Lidocaine despite education provided. Cont of Bowel and bladder. Awake at times throughout the shift. Falls/safety precautions in place. Will continue to monitor.      Problem: Fall Injury Risk  Goal: Absence of Fall and Fall-Related Injury  Outcome: Progressing  Intervention: Promote Injury-Free Environment  Recent Flowsheet Documentation  Taken 05/18/2021 0400 by Kathaleen Maser, RN  Safety Interventions: fall reduction program maintained  Taken 05/18/2021 0200 by Kathaleen Maser, RN  Safety Interventions: fall reduction program maintained  Taken 05/18/2021 0000 by Kathaleen Maser, RN  Safety Interventions: fall reduction program maintained  Taken 05/17/2021 2200 by Kathaleen Maser, RN  Safety Interventions: fall reduction program maintained  Taken 05/17/2021 2000 by Kathaleen Maser, RN  Safety Interventions: fall reduction program maintained     Problem: Self-Care Deficit  Goal: Improved Ability to Complete Activities of Daily Living  Outcome: Progressing     Problem: Skin Injury Risk Increased  Goal: Skin Health and Integrity  Outcome: Progressing  Intervention: Optimize Skin Protection  Recent Flowsheet Documentation  Taken 05/18/2021 0400 by Kathaleen Maser, RN  Pressure Reduction Techniques: frequent weight shift encouraged  Taken 05/18/2021 0200 by Kathaleen Maser, RN  Pressure Reduction Techniques: frequent weight shift encouraged  Taken 05/18/2021 0000 by Kathaleen Maser, RN  Pressure Reduction Techniques: frequent weight shift encouraged  Taken 05/17/2021 2200 by Kathaleen Maser, RN  Pressure Reduction Techniques: frequent weight shift encouraged  Taken 05/17/2021 2000 by Kathaleen Maser, RN  Pressure Reduction Techniques: frequent weight shift encouraged

## 2021-05-19 ENCOUNTER — Ambulatory Visit: Admit: 2021-05-19 | Discharge: 2021-05-20 | Payer: MEDICARE

## 2021-05-19 LAB — CBC W/ AUTO DIFF
BASOPHILS ABSOLUTE COUNT: 0 10*9/L (ref 0.0–0.1)
BASOPHILS RELATIVE PERCENT: 0.6 %
EOSINOPHILS ABSOLUTE COUNT: 0 10*9/L (ref 0.0–0.5)
EOSINOPHILS RELATIVE PERCENT: 1.5 %
HEMATOCRIT: 22.5 % — ABNORMAL LOW (ref 39.0–48.0)
HEMOGLOBIN: 7.4 g/dL — ABNORMAL LOW (ref 12.9–16.5)
LYMPHOCYTES ABSOLUTE COUNT: 0.3 10*9/L — ABNORMAL LOW (ref 1.1–3.6)
LYMPHOCYTES RELATIVE PERCENT: 10.5 %
MEAN CORPUSCULAR HEMOGLOBIN CONC: 32.9 g/dL (ref 32.0–36.0)
MEAN CORPUSCULAR HEMOGLOBIN: 30.8 pg (ref 25.9–32.4)
MEAN CORPUSCULAR VOLUME: 93.7 fL (ref 77.6–95.7)
MEAN PLATELET VOLUME: 7.4 fL (ref 6.8–10.7)
MONOCYTES ABSOLUTE COUNT: 0.3 10*9/L (ref 0.3–0.8)
MONOCYTES RELATIVE PERCENT: 13.3 %
NEUTROPHILS ABSOLUTE COUNT: 1.9 10*9/L (ref 1.8–7.8)
NEUTROPHILS RELATIVE PERCENT: 74.1 %
NUCLEATED RED BLOOD CELLS: 0 /100{WBCs} (ref <=4)
PLATELET COUNT: 93 10*9/L — ABNORMAL LOW (ref 150–450)
RED BLOOD CELL COUNT: 2.4 10*12/L — ABNORMAL LOW (ref 4.26–5.60)
RED CELL DISTRIBUTION WIDTH: 19.8 % — ABNORMAL HIGH (ref 12.2–15.2)
WBC ADJUSTED: 2.5 10*9/L — ABNORMAL LOW (ref 3.6–11.2)

## 2021-05-19 LAB — PERIPHERAL BLOOD SMEAR, PATH REVIEW

## 2021-05-19 LAB — PSA: PROSTATE SPECIFIC ANTIGEN: 1694.29 ng/mL — ABNORMAL HIGH (ref 0.00–4.00)

## 2021-05-19 LAB — HAPTOGLOBIN: HAPTOGLOBIN: 235 mg/dL (ref 40–280)

## 2021-05-19 LAB — LACTATE DEHYDROGENASE: LACTATE DEHYDROGENASE: 136 U/L (ref 120–246)

## 2021-05-19 MED ADMIN — apixaban (ELIQUIS) tablet 5 mg: 5 mg | ORAL | @ 13:00:00

## 2021-05-19 MED ADMIN — aluminum-magnesium hydroxide-simethicone (MAALOX MAX) 80-80-8 mg/mL oral suspension: 30 mL | ORAL | @ 13:00:00

## 2021-05-19 MED ADMIN — lidocaine (XYLOCAINE) 2% viscous mucosal solution: 10 mL | ORAL | @ 01:00:00

## 2021-05-19 MED ADMIN — predniSONE (DELTASONE) tablet 5 mg: 5 mg | ORAL | @ 13:00:00

## 2021-05-19 MED ADMIN — aluminum-magnesium hydroxide-simethicone (MAALOX MAX) 80-80-8 mg/mL oral suspension: 30 mL | ORAL | @ 01:00:00

## 2021-05-19 MED ADMIN — lidocaine (LIDODERM) 5 % patch 3 patch: 3 | TRANSDERMAL | @ 13:00:00

## 2021-05-19 MED ADMIN — sucralfate (CARAFATE) oral suspension: 1 g | ORAL | @ 14:00:00

## 2021-05-19 MED ADMIN — MORPhine (MS CONTIN) 12 hr tablet 15 mg: 15 mg | ORAL | @ 10:00:00 | Stop: 2021-05-30

## 2021-05-19 MED ADMIN — MORPhine (MS CONTIN) 12 hr tablet 15 mg: 15 mg | ORAL | @ 18:00:00 | Stop: 2021-05-30

## 2021-05-19 MED ADMIN — oxyCODONE (ROXICODONE) 5 mg/5 mL solution 10 mg: 10 mg | ORAL | @ 20:00:00 | Stop: 2021-05-29

## 2021-05-19 MED ADMIN — lidocaine (XYLOCAINE) 2% viscous mucosal solution: 10 mL | ORAL | @ 13:00:00

## 2021-05-19 MED ADMIN — amLODIPine (NORVASC) tablet 5 mg: 5 mg | ORAL | @ 13:00:00

## 2021-05-19 MED ADMIN — abiraterone (ZYTIGA) tablet 1,000 mg **Patient Supplied**: 1000 mg | ORAL | @ 13:00:00

## 2021-05-19 MED ADMIN — sucralfate (CARAFATE) oral suspension: 1 g | ORAL | @ 01:00:00

## 2021-05-19 MED ADMIN — apixaban (ELIQUIS) tablet 5 mg: 5 mg | ORAL | @ 01:00:00

## 2021-05-19 MED ADMIN — atorvastatin (LIPITOR) tablet 40 mg: 40 mg | ORAL | @ 13:00:00

## 2021-05-19 MED ADMIN — MORPhine (MS CONTIN) 12 hr tablet 15 mg: 15 mg | ORAL | @ 01:00:00 | Stop: 2021-05-30

## 2021-05-19 MED ADMIN — DULoxetine (CYMBALTA) DR capsule 60 mg: 60 mg | ORAL | @ 10:00:00

## 2021-05-19 NOTE — Unmapped (Signed)
Patient A&O x 4, continent of bowel and bladder. Skin intact. Swallow with pain. Neck and shoulder pain manage with Q4 PRN oxycodone. Call bell and phone within reach.  Problem: Rehabilitation (IRF) Plan of Care  Goal: Plan of Care Review  Outcome: Progressing  Goal: Patient-Specific Goal (Individualized)  Outcome: Progressing  Goal: Absence of New-Onset Illness or Injury  Outcome: Progressing  Intervention: Prevent Fall and Fall Injury  Recent Flowsheet Documentation  Taken 05/19/2021 1200 by Lewis Moccasin, RN BSN  Safety Interventions: fall reduction program maintained  Taken 05/19/2021 1000 by Lewis Moccasin, RN BSN  Safety Interventions: fall reduction program maintained  Taken 05/19/2021 0800 by Lewis Moccasin, RN BSN  Safety Interventions:   family at bedside   low bed   nonskid shoes/slippers when out of bed  Intervention: Prevent VTE (Venous Thromboembolism)  Recent Flowsheet Documentation  Taken 05/19/2021 0900 by Lewis Moccasin, RN BSN  VTE Prevention/Management: anticoagulant therapy  Goal: Optimal Comfort and Wellbeing  Outcome: Progressing  Goal: Home and Community Transition Plan Established  Outcome: Progressing     Problem: Fall Injury Risk  Goal: Absence of Fall and Fall-Related Injury  Outcome: Progressing  Intervention: Promote Injury-Free Environment  Recent Flowsheet Documentation  Taken 05/19/2021 1200 by Lewis Moccasin, RN BSN  Safety Interventions: fall reduction program maintained  Taken 05/19/2021 1000 by Lewis Moccasin, RN BSN  Safety Interventions: fall reduction program maintained  Taken 05/19/2021 0800 by Lewis Moccasin, RN BSN  Safety Interventions:   family at bedside   low bed   nonskid shoes/slippers when out of bed     Problem: Self-Care Deficit  Goal: Improved Ability to Complete Activities of Daily Living  Outcome: Progressing     Problem: Skin Injury Risk Increased  Goal: Skin Health and Integrity  Outcome: Progressing     Problem: Hypertension Comorbidity  Goal: Blood Pressure in Desired Range  Outcome: Progressing

## 2021-05-19 NOTE — Unmapped (Signed)
Physical Medicine and Rehabilitation  Daily Progress Note [facility]    ASSESSMENT:     Kevin Cohen is a 70 y.o. male ??with a past medical history of metastatic prostate cancer and known bony metastases s/p chemo+RT admitted for cervical myelopathy with a neurologic level of C5 in the setting of incomplete SCI. He is now admitted to [facility] for comprehensive interdisciplinary rehabilitation.   ??  Rehab Impairment Group Code [facility]): (Spinal Cord Dysfunction - Non-Traumatic) [id number] Quadriplegia, Incomplete C5-8  Etiology: Metastatic Prostate Cancer    PLAN:     REHAB:   - PT and OT to maximize functional status with mobility and ADLs as well as prevention of joint contracture.   - RT for community re-integration, education, and leisure support services.  - P&O for assistive devices PRN.  - To be discussed in weekly Interdisciplinary Team Conference.  ??  Non Traumatic, Cervical Spinal Cord Injury: Presented with several month history of symptoms concerning for cervical myelopathy and several week history of worsening LUE weakness. MRI total spine from 9/14 shows diffuse metastatic disease with epidural thickening and spinal canal stenosis from C3-5 without cord signal change, and left foraminal invasion at C5-6. Patient not surgical candidate and placed on steroid taper and radiation treatment as below.  ?? Post-operative Pain:  ? Palliative Care Consult  ? MS Contin 15 mg PO TID  ? Oxycodone 10 mg PO every 4 hours prn pain  ? Duloxetine 30 mg PO daily x 1 week and then increase to 60 mg PO daily. First dose 10/13.  ? S/p Gabapentin to 300 gm PO nightly x 1 week (10/13-10/20)  ? Tylenol 650 mg PO q4h PRN  ? Lidocaine patch, voltaren gel PRN  ?? Neuropathic Pain  ? CTM  ?? Spasticity: No issues with spasms currently. Will monitor.   ? CTM  ? Bladder:?? Patient endorses sensation and is able to void volitionally.   ? Strict I&O's  ?? Bowel:??Volitional. Admission KUB with mod stool burden.   ? Colace 100 mg BID PRN  ? Miralax PRN  ? Bisacodyl Suppository daily PRN  ?? Equipment:  ? Place Bilateral hard PRAFO Boots to protect patient's heels from pressure injury and keep foot in neutral position to avoid PF contractures.  ?? Monitor for Autonomic Dysreflexia: Nitropaste PRN  ?? BMP and CBC every Monday/Thursday  ??  Metastatic Prostate Cancer  Previously on hospice 04/2020. Follows with Dr Philomena Course and started ADT +abi/pred. Metastatic prostate cancer with recent cord compression (T5-T6) and worsening??left arm??weakness, MRI total spine from 9/14 shows diffuse metastatic disease with epidural thickening and spinal canal stenosis from C3-5 without cord signal change, and left foraminal invasion at C5-6. SRN felt patient was not surgical candidate and patient started on radiation therapy and steroid taper. PSA elevated to 1694 on 10/28.  - Rad/Onc consult  - Radiation treatment Monday-Friday (ended 10/19)  - Zytiga 1000 mg daily- needs CMP every couple of weeks   - Prednisone taper: 30mg  (10/15-10/17); 20mg  (10/18-10/20);10mg  (10/21-10/23) and 5mg  (10/24-)   - Lupron 7.5mg  monthly (last dose 10/20), to f/u in clinic for q3 month injection after discharge    Radiation induced esophagitis  Patient noting increased discomfort with swallowing medications and food following completion of radiation. Per oncology should improve in 1-2 weeks.  - GI cocktail BID  - Liquid formulation of medication where possible  ??  HTN  - Amlodipine 5 mg daily  ??  HLD  - Lipitor 40 mg daily  ??  Pancytopenia  Chronic inflammatory and iron deficiency. Noted to have slow down trending blood cell counts. On 10/27 Plt-88, Hgb-7.1, WBC-2.6. Oncology felt may be combination of zytiga and radiation, however requested further studies to evaluate. Folate-18.5, LDH-136.    - Oncology consult  - F/u pathology smear  - Ferrous sulfate 350 mg every other day  ??  CKD  Baseline Cr ~1.0-1.2.  -BMP MTh  ??  DVT  Left peroneal and politeal veins dx 04/26/21. IVC filter placed 10/5 by VIR, needs outpt removal in 3-6 months  - Eliquis 5mg  po bid    DISPO: Patient to be discussed at weekly interdisciplinary team conference.   - EDD: 11/1  - Follow-up: PM&R, PCP, Rad/Onc, Palliative    SUBJECTIVE:     Interval Events: NAEO. Patient notes esophagitis pain is improving and he continues to improve PO intake. Will follow up with oncology regarding pancytopenia and requested labs. No further complaints today.     OBJECTIVE:     Vital signs (last 24 hours):  Temp:  [36.5 ??C (97.7 ??F)-36.6 ??C (97.9 ??F)] 36.5 ??C (97.7 ??F)  Heart Rate:  [73-74] 73  Resp:  [16-18] 16  BP: (110-133)/(61-76) 133/76  MAP (mmHg):  [72-90] 90  SpO2:  [97 %-99 %] 97 %    Intake/Output (last 3 shifts):  No intake/output data recorded.    Physical Exam:   GEN: NAD, sitting in wheelchair  EYES: sclera anicteric, conjunctiva clear   HEENT: NCAT  NECK: trachea midline  CV: warm extremities, well perfused, no cyanosis,   RESP:  NWOB on RA  ABD: Not distended  SKIN: no visible masses, lesions, rashes, ecchymoses.   MSK: no notable contractures or ext swelling  NEURO:Alert, MAE, speech fluid and coherent, responds appropriately to questions   PSYCH: mood euthymic, affect appropriate, thought process logical         Medications:  Scheduled   ??? abiraterone (ZYTIGA) tablet 1,000 mg **Patient Supplied** Daily   ??? lidocaine (XYLOCAINE) 2% viscous mucosal solution BID    And   ??? sucralfate (CARAFATE) oral suspension BID    And   ??? aluminum-magnesium hydroxide-simethicone (MAALOX MAX) 80-80-8 mg/mL oral suspension BID   ??? amLODIPine (NORVASC) tablet 5 mg Daily   ??? apixaban (ELIQUIS) tablet 5 mg BID   ??? atorvastatin (LIPITOR) tablet 40 mg Daily   ??? DULoxetine (CYMBALTA) DR capsule 60 mg daily   ??? ferrous sulfate tablet 325 mg Every other day   ??? leuprolide (LUPRON) injection 7.5 mg Q28 Days   ??? lidocaine (LIDODERM) 5 % patch 3 patch Daily   ??? MORPhine (MS CONTIN) 12 hr tablet 15 mg Q8H SCH   ??? predniSONE (DELTASONE) tablet 5 mg Daily PRN acetaminophen, 650 mg, Q6H PRN  albuterol, 2 puff, Q4H PRN  lidocaine 2% viscous, 10 mL, Q4H PRN   And  sucralfate, 1 g, Q4H PRN   And  aluminum-magnesium hydroxide-simethicone, 30 mL, Q4H PRN  bisacodyL, 10 mg, Daily PRN  calcium carbonate, 200 mg of elem calcium, TID PRN  guaiFENesin, 100 mg, Q4H PRN  melatonin, 3 mg, Nightly PRN  ondansetron, 4 mg, Q6H PRN  oxyCODONE, 5 mg, Q4H PRN   Or  oxyCODONE, 10 mg, Q4H PRN  polyethylene glycol, 17 g, Daily PRN  simethicone, 80 mg, TID PRN      Labs/Studies: Reviewed     Radiology Results: no new

## 2021-05-20 MED ADMIN — lidocaine (XYLOCAINE) 2% viscous mucosal solution: 10 mL | ORAL | @ 01:00:00

## 2021-05-20 MED ADMIN — apixaban (ELIQUIS) tablet 5 mg: 5 mg | ORAL | @ 13:00:00

## 2021-05-20 MED ADMIN — DULoxetine (CYMBALTA) DR capsule 60 mg: 60 mg | ORAL | @ 10:00:00

## 2021-05-20 MED ADMIN — apixaban (ELIQUIS) tablet 5 mg: 5 mg | ORAL | @ 01:00:00

## 2021-05-20 MED ADMIN — atorvastatin (LIPITOR) tablet 40 mg: 40 mg | ORAL | @ 13:00:00

## 2021-05-20 MED ADMIN — abiraterone (ZYTIGA) tablet 1,000 mg **Patient Supplied**: 1000 mg | ORAL | @ 13:00:00

## 2021-05-20 MED ADMIN — MORPhine (MS CONTIN) 12 hr tablet 15 mg: 15 mg | ORAL | @ 02:00:00 | Stop: 2021-05-30

## 2021-05-20 MED ADMIN — sucralfate (CARAFATE) oral suspension: 1 g | ORAL | @ 01:00:00

## 2021-05-20 MED ADMIN — predniSONE (DELTASONE) tablet 5 mg: 5 mg | ORAL | @ 13:00:00

## 2021-05-20 MED ADMIN — aluminum-magnesium hydroxide-simethicone (MAALOX MAX) 80-80-8 mg/mL oral suspension: 30 mL | ORAL | @ 01:00:00

## 2021-05-20 MED ADMIN — MORPhine (MS CONTIN) 12 hr tablet 15 mg: 15 mg | ORAL | @ 10:00:00 | Stop: 2021-05-30

## 2021-05-20 MED ADMIN — lidocaine (LIDODERM) 5 % patch 3 patch: 3 | TRANSDERMAL | @ 13:00:00

## 2021-05-20 MED ADMIN — ferrous sulfate tablet 325 mg: 325 mg | ORAL | @ 13:00:00

## 2021-05-20 MED ADMIN — aluminum-magnesium hydroxide-simethicone (MAALOX MAX) 80-80-8 mg/mL oral suspension: 30 mL | ORAL | @ 13:00:00

## 2021-05-20 MED ADMIN — MORPhine (MS CONTIN) 12 hr tablet 15 mg: 15 mg | ORAL | @ 19:00:00 | Stop: 2021-05-30

## 2021-05-20 MED ADMIN — lidocaine (XYLOCAINE) 2% viscous mucosal solution: 10 mL | ORAL | @ 13:00:00

## 2021-05-20 MED ADMIN — amLODIPine (NORVASC) tablet 5 mg: 5 mg | ORAL | @ 13:00:00

## 2021-05-20 MED ADMIN — sucralfate (CARAFATE) oral suspension: 1 g | ORAL | @ 15:00:00

## 2021-05-20 NOTE — Unmapped (Signed)
Patient is alert and oriented  x 4 ,difficulty in swallowing noted ,continent of B/B ,safety maintained,call bell within reach,bed in lowest position.      Problem: Rehabilitation (IRF) Plan of Care  Goal: Plan of Care Review  Outcome: Progressing  Goal: Patient-Specific Goal (Individualized)  Outcome: Progressing  Goal: Absence of New-Onset Illness or Injury  Outcome: Progressing  Intervention: Prevent Fall and Fall Injury  Recent Flowsheet Documentation  Taken 05/20/2021 0200 by Orland Dec, RN  Safety Interventions: fall reduction program maintained  Taken 05/20/2021 0000 by Orland Dec, RN  Safety Interventions: fall reduction program maintained  Taken 05/19/2021 2200 by Orland Dec, RN  Safety Interventions: fall reduction program maintained  Taken 05/19/2021 2000 by Orland Dec, RN  Safety Interventions: fall reduction program maintained  Intervention: Prevent Infection  Recent Flowsheet Documentation  Taken 05/20/2021 0200 by Orland Dec, RN  Infection Prevention: hand hygiene promoted  Taken 05/20/2021 0000 by Orland Dec, RN  Infection Prevention: hand hygiene promoted  Taken 05/19/2021 2200 by Orland Dec, RN  Infection Prevention: hand hygiene promoted  Taken 05/19/2021 2000 by Orland Dec, RN  Infection Prevention: hand hygiene promoted  Intervention: Prevent VTE (Venous Thromboembolism)  Recent Flowsheet Documentation  Taken 05/19/2021 2200 by Orland Dec, RN  VTE Prevention/Management: anticoagulant therapy  Goal: Optimal Comfort and Wellbeing  Outcome: Progressing  Goal: Home and Community Transition Plan Established  Outcome: Progressing     Problem: Fall Injury Risk  Goal: Absence of Fall and Fall-Related Injury  Outcome: Progressing  Intervention: Promote Injury-Free Environment  Recent Flowsheet Documentation  Taken 05/20/2021 0200 by Orland Dec, RN  Safety Interventions: fall reduction program maintained  Taken 05/20/2021 0000 by Orland Dec, RN  Safety Interventions: fall reduction program maintained  Taken 05/19/2021 2200 by Orland Dec, RN  Safety Interventions: fall reduction program maintained  Taken 05/19/2021 2000 by Orland Dec, RN  Safety Interventions: fall reduction program maintained     Problem: Self-Care Deficit  Goal: Improved Ability to Complete Activities of Daily Living  Outcome: Progressing     Problem: Skin Injury Risk Increased  Goal: Skin Health and Integrity  Outcome: Progressing  Intervention: Optimize Skin Protection  Recent Flowsheet Documentation  Taken 05/20/2021 0200 by Orland Dec, RN  Pressure Reduction Techniques: frequent weight shift encouraged  Pressure Reduction Devices: heel offloading device utilized  Skin Protection: incontinence pads utilized  Taken 05/20/2021 0000 by Orland Dec, RN  Pressure Reduction Techniques: frequent weight shift encouraged  Pressure Reduction Devices: heel offloading device utilized  Skin Protection: incontinence pads utilized  Taken 05/19/2021 2200 by Orland Dec, RN  Pressure Reduction Techniques: frequent weight shift encouraged  Pressure Reduction Devices: heel offloading device utilized  Skin Protection: incontinence pads utilized  Taken 05/19/2021 2000 by Orland Dec, RN  Pressure Reduction Techniques: frequent weight shift encouraged  Head of Bed (HOB) Positioning: HOB elevated  Pressure Reduction Devices: heel offloading device utilized  Skin Protection: incontinence pads utilized     Problem: Hypertension Comorbidity  Goal: Blood Pressure in Desired Range  Outcome: Progressing

## 2021-05-20 NOTE — Unmapped (Signed)
Physical Medicine and Rehabilitation  Daily Progress Note [facility]    ASSESSMENT:     Kevin Cohen is a 70 y.o. male ??with a past medical history of metastatic prostate cancer and known bony metastases s/p chemo+RT admitted for cervical myelopathy with a neurologic level of C5 in the setting of incomplete SCI. He is now admitted to [facility] for comprehensive interdisciplinary rehabilitation.   ??  Rehab Impairment Group Code [facility]): (Spinal Cord Dysfunction - Non-Traumatic) [id number] Quadriplegia, Incomplete C5-8  Etiology: Metastatic Prostate Cancer    PLAN:   Weekend Updates:  10/29: NAEON. VSS. No BM charted since 10/24. Per nursing had a BM 10/28. 10/28 labs stable/baseline.      REHAB:   - PT and OT to maximize functional status with mobility and ADLs as well as prevention of joint contracture.   - RT for community re-integration, education, and leisure support services.  - P&O for assistive devices PRN.  - To be discussed in weekly Interdisciplinary Team Conference.  ??  Non Traumatic, Cervical Spinal Cord Injury: Presented with several month history of symptoms concerning for cervical myelopathy and several week history of worsening LUE weakness. MRI total spine from 9/14 shows diffuse metastatic disease with epidural thickening and spinal canal stenosis from C3-5 without cord signal change, and left foraminal invasion at C5-6. Patient not surgical candidate and placed on steroid taper and radiation treatment as below.  ?? Post-operative Pain:  ? Palliative Care Consult  ? MS Contin 15 mg PO TID  ? Oxycodone 10 mg PO every 4 hours prn pain  ? Duloxetine 30 mg PO daily x 1 week and then increase to 60 mg PO daily. First dose 10/13.  ? S/p Gabapentin to 300 gm PO nightly x 1 week (10/13-10/20)  ? Tylenol 650 mg PO q4h PRN  ? Lidocaine patch, voltaren gel PRN  ?? Neuropathic Pain  ? CTM  ?? Spasticity: No issues with spasms currently. Will monitor.   ? CTM  ? Bladder:?? Patient endorses sensation and is able to void volitionally.   ? Strict I&O's  ?? Bowel:??Volitional. Admission KUB with mod stool burden.   ? Colace 100 mg BID PRN  ? Miralax PRN  ? Bisacodyl Suppository daily PRN  ?? Equipment:  ? Place Bilateral hard PRAFO Boots to protect patient's heels from pressure injury and keep foot in neutral position to avoid PF contractures.  ?? Monitor for Autonomic Dysreflexia: Nitropaste PRN  ?? BMP and CBC every Monday/Thursday  ??  Metastatic Prostate Cancer  Previously on hospice 04/2020. Follows with Dr Philomena Course and started ADT +abi/pred. Metastatic prostate cancer with recent cord compression (T5-T6) and worsening??left arm??weakness, MRI total spine from 9/14 shows diffuse metastatic disease with epidural thickening and spinal canal stenosis from C3-5 without cord signal change, and left foraminal invasion at C5-6. SRN felt patient was not surgical candidate and patient started on radiation therapy and steroid taper. PSA elevated to 1694 on 10/28.  - Rad/Onc consult  - Radiation treatment Monday-Friday (ended 10/19)  - Zytiga 1000 mg daily- needs CMP every couple of weeks   - Prednisone taper: 30mg  (10/15-10/17); 20mg  (10/18-10/20);10mg  (10/21-10/23) and 5mg  (10/24-)   - Lupron 7.5mg  monthly (last dose 10/20), to f/u in clinic for q3 month injection after discharge    Radiation induced esophagitis  Patient noting increased discomfort with swallowing medications and food following completion of radiation. Per oncology should improve in 1-2 weeks.  - GI cocktail BID  - Liquid formulation of medication where  possible  ??  HTN  - Amlodipine 5 mg daily  ??  HLD  - Lipitor 40 mg daily  ??  Pancytopenia  Chronic inflammatory and iron deficiency. Noted to have slow down trending blood cell counts. On 10/27 Plt-88, Hgb-7.1, WBC-2.6. Oncology felt may be combination of zytiga and radiation, however requested further studies to evaluate. Folate-18.5, LDH-136.  Etiologies of pancytopenia could include low production due to nutritional deficiencies (B12 wnl 04/2021, iron studies c/w ACD, folate level not checked) vs bone marrow infiltrative process (though PSA downtrending as of 04/27/21) vs less likely recent radiation therapy vs less likely destructive process (TBili wnl 05/17/21). Unlikely that his ongoing treatment for prostate cancer is causing myelosuppression.  - Oncology consult  - F/u pathology smear  - Ferrous sulfate 350 mg every other day  ??  CKD  Baseline Cr ~1.0-1.2.  -BMP MTh  ??  DVT  Left peroneal and politeal veins dx 04/26/21. IVC filter placed 10/5 by VIR, needs outpt removal in 3-6 months  - Eliquis 5mg  po bid    DISPO: Patient to be discussed at weekly interdisciplinary team conference.   - EDD: 11/1  - Follow-up: PM&R, PCP, Rad/Onc, Palliative    SUBJECTIVE:     Interval Events: See above.    OBJECTIVE:     Vital signs (last 24 hours):  Temp:  [36.8 ??C (98.2 ??F)-37.2 ??C (99 ??F)] 36.8 ??C (98.2 ??F)  Heart Rate:  [71-83] 83  Resp:  [16-18] 18  BP: (107)/(60-66) 107/66  MAP (mmHg):  [7-74] 7  SpO2:  [97 %-99 %] 99 %    Intake/Output (last 3 shifts):  No intake/output data recorded.    Physical Exam:   GEN: NAD  EYES: sclera anicteric, conjunctiva clear   HEENT: NCAT  NECK: trachea midline  CV: warm extremities, well perfused, no cyanosis,   RESP:  NWOB on RA  ABD: Not distended  SKIN: no visible masses, lesions, rashes, ecchymoses.   MSK: no notable contractures or ext swelling  NEURO:Alert, MAE, speech fluid and coherent, responds appropriately to questions   PSYCH: mood euthymic, affect appropriate, thought process logical         Medications:  Scheduled   ??? abiraterone (ZYTIGA) tablet 1,000 mg **Patient Supplied** Daily   ??? lidocaine (XYLOCAINE) 2% viscous mucosal solution BID    And   ??? sucralfate (CARAFATE) oral suspension BID    And   ??? aluminum-magnesium hydroxide-simethicone (MAALOX MAX) 80-80-8 mg/mL oral suspension BID   ??? amLODIPine (NORVASC) tablet 5 mg Daily   ??? apixaban (ELIQUIS) tablet 5 mg BID   ??? atorvastatin (LIPITOR) tablet 40 mg Daily   ??? DULoxetine (CYMBALTA) DR capsule 60 mg daily   ??? ferrous sulfate tablet 325 mg Every other day   ??? leuprolide (LUPRON) injection 7.5 mg Q28 Days   ??? lidocaine (LIDODERM) 5 % patch 3 patch Daily   ??? MORPhine (MS CONTIN) 12 hr tablet 15 mg Q8H SCH   ??? predniSONE (DELTASONE) tablet 5 mg Daily     PRN acetaminophen, 650 mg, Q6H PRN  albuterol, 2 puff, Q4H PRN  lidocaine 2% viscous, 10 mL, Q4H PRN   And  sucralfate, 1 g, Q4H PRN   And  aluminum-magnesium hydroxide-simethicone, 30 mL, Q4H PRN  bisacodyL, 10 mg, Daily PRN  calcium carbonate, 200 mg of elem calcium, TID PRN  guaiFENesin, 100 mg, Q4H PRN  melatonin, 3 mg, Nightly PRN  ondansetron, 4 mg, Q6H PRN  oxyCODONE, 5 mg, Q4H PRN  Or  oxyCODONE, 10 mg, Q4H PRN  polyethylene glycol, 17 g, Daily PRN  simethicone, 80 mg, TID PRN      Labs/Studies: Reviewed     Radiology Results: no new      Sunday Shams, MD  PGY-2 - Physical Medicine & Rehabilitation

## 2021-05-21 ENCOUNTER — Ambulatory Visit: Admit: 2021-05-21 | Discharge: 2021-05-22 | Payer: MEDICARE

## 2021-05-21 LAB — CBC W/ AUTO DIFF
BASOPHILS ABSOLUTE COUNT: 0 10*9/L (ref 0.0–0.1)
BASOPHILS RELATIVE PERCENT: 0.5 %
EOSINOPHILS ABSOLUTE COUNT: 0 10*9/L (ref 0.0–0.5)
EOSINOPHILS RELATIVE PERCENT: 1.9 %
HEMATOCRIT: 22.3 % — ABNORMAL LOW (ref 39.0–48.0)
HEMOGLOBIN: 7.3 g/dL — ABNORMAL LOW (ref 12.9–16.5)
LYMPHOCYTES ABSOLUTE COUNT: 0.3 10*9/L — ABNORMAL LOW (ref 1.1–3.6)
LYMPHOCYTES RELATIVE PERCENT: 11 %
MEAN CORPUSCULAR HEMOGLOBIN CONC: 32.6 g/dL (ref 32.0–36.0)
MEAN CORPUSCULAR HEMOGLOBIN: 30.8 pg (ref 25.9–32.4)
MEAN CORPUSCULAR VOLUME: 94.3 fL (ref 77.6–95.7)
MEAN PLATELET VOLUME: 7.8 fL (ref 6.8–10.7)
MONOCYTES ABSOLUTE COUNT: 0.4 10*9/L (ref 0.3–0.8)
MONOCYTES RELATIVE PERCENT: 14 %
NEUTROPHILS ABSOLUTE COUNT: 1.8 10*9/L (ref 1.8–7.8)
NEUTROPHILS RELATIVE PERCENT: 72.6 %
NUCLEATED RED BLOOD CELLS: 0 /100{WBCs} (ref <=4)
PLATELET COUNT: 105 10*9/L — ABNORMAL LOW (ref 150–450)
RED BLOOD CELL COUNT: 2.36 10*12/L — ABNORMAL LOW (ref 4.26–5.60)
RED CELL DISTRIBUTION WIDTH: 19.4 % — ABNORMAL HIGH (ref 12.2–15.2)
WBC ADJUSTED: 2.5 10*9/L — ABNORMAL LOW (ref 3.6–11.2)

## 2021-05-21 MED ADMIN — apixaban (ELIQUIS) tablet 5 mg: 5 mg | ORAL | @ 01:00:00

## 2021-05-21 MED ADMIN — aluminum-magnesium hydroxide-simethicone (MAALOX MAX) 80-80-8 mg/mL oral suspension: 30 mL | ORAL | @ 01:00:00

## 2021-05-21 MED ADMIN — MORPhine (MS CONTIN) 12 hr tablet 15 mg: 15 mg | ORAL | @ 10:00:00 | Stop: 2021-05-30

## 2021-05-21 MED ADMIN — lidocaine (LIDODERM) 5 % patch 3 patch: 3 | TRANSDERMAL | @ 13:00:00

## 2021-05-21 MED ADMIN — abiraterone (ZYTIGA) tablet 1,000 mg **Patient Supplied**: 1000 mg | ORAL | @ 13:00:00

## 2021-05-21 MED ADMIN — MORPhine (MS CONTIN) 12 hr tablet 15 mg: 15 mg | ORAL | @ 18:00:00 | Stop: 2021-05-30

## 2021-05-21 MED ADMIN — aluminum-magnesium hydroxide-simethicone (MAALOX MAX) 80-80-8 mg/mL oral suspension: 30 mL | ORAL | @ 13:00:00

## 2021-05-21 MED ADMIN — amLODIPine (NORVASC) tablet 5 mg: 5 mg | ORAL | @ 13:00:00

## 2021-05-21 MED ADMIN — MORPhine (MS CONTIN) 12 hr tablet 15 mg: 15 mg | ORAL | @ 02:00:00 | Stop: 2021-05-30

## 2021-05-21 MED ADMIN — lidocaine (XYLOCAINE) 2% viscous mucosal solution: 10 mL | ORAL | @ 01:00:00

## 2021-05-21 MED ADMIN — sucralfate (CARAFATE) oral suspension: 1 g | ORAL | @ 01:00:00

## 2021-05-21 MED ADMIN — DULoxetine (CYMBALTA) DR capsule 60 mg: 60 mg | ORAL | @ 10:00:00

## 2021-05-21 MED ADMIN — predniSONE (DELTASONE) tablet 5 mg: 5 mg | ORAL | @ 13:00:00

## 2021-05-21 MED ADMIN — atorvastatin (LIPITOR) tablet 40 mg: 40 mg | ORAL | @ 13:00:00

## 2021-05-21 MED ADMIN — sucralfate (CARAFATE) oral suspension: 1 g | ORAL | @ 15:00:00

## 2021-05-21 MED ADMIN — apixaban (ELIQUIS) tablet 5 mg: 5 mg | ORAL | @ 13:00:00

## 2021-05-21 MED ADMIN — lidocaine (XYLOCAINE) 2% viscous mucosal solution: 10 mL | ORAL | @ 13:00:00

## 2021-05-21 NOTE — Unmapped (Signed)
Physical Medicine and Rehabilitation  Daily Progress Note [facility]    ASSESSMENT:     Kevin Cohen is a 70 y.o. male ??with a past medical history of metastatic prostate cancer and known bony metastases s/p chemo+RT admitted for cervical myelopathy with a neurologic level of C5 in the setting of incomplete SCI. He is now admitted to [facility] for comprehensive interdisciplinary rehabilitation.   ??  Rehab Impairment Group Code [facility]): (Spinal Cord Dysfunction - Non-Traumatic) [id number] Quadriplegia, Incomplete C5-8  Etiology: Metastatic Prostate Cancer    PLAN:   Weekend Updates:  10/29: NAEON. VSS. No BM charted since 10/24. Per nursing had a BM 10/28. 10/28 labs stable/baseline.  10/30: NAEON. VSS. BM 10/29. 10/28 labs stable/baseline. Neck pain endorsed. Lidocaine patch in place. Added heating pad.     REHAB:   - PT and OT to maximize functional status with mobility and ADLs as well as prevention of joint contracture.   - RT for community re-integration, education, and leisure support services.  - P&O for assistive devices PRN.  - To be discussed in weekly Interdisciplinary Team Conference.  ??  Non Traumatic, Cervical Spinal Cord Injury: Presented with several month history of symptoms concerning for cervical myelopathy and several week history of worsening LUE weakness. MRI total spine from 9/14 shows diffuse metastatic disease with epidural thickening and spinal canal stenosis from C3-5 without cord signal change, and left foraminal invasion at C5-6. Patient not surgical candidate and placed on steroid taper and radiation treatment as below.  ?? Post-operative Pain:  ? Palliative Care Consult  ? MS Contin 15 mg PO TID  ? Oxycodone 10 mg PO every 4 hours prn pain  ? Duloxetine 30 mg PO daily x 1 week and then increase to 60 mg PO daily. First dose 10/13.  ? S/p Gabapentin to 300 gm PO nightly x 1 week (10/13-10/20)  ? Tylenol 650 mg PO q4h PRN  ? Lidocaine patch, voltaren gel PRN  ?? Neuropathic Pain  ? CTM  ?? Spasticity: No issues with spasms currently. Will monitor.   ? CTM  ? Bladder:?? Patient endorses sensation and is able to void volitionally.   ? Strict I&O's  ?? Bowel:??Volitional. Admission KUB with mod stool burden.   ? Colace 100 mg BID PRN  ? Miralax PRN  ? Bisacodyl Suppository daily PRN  ?? Equipment:  ? Place Bilateral hard PRAFO Boots to protect patient's heels from pressure injury and keep foot in neutral position to avoid PF contractures.  ?? Monitor for Autonomic Dysreflexia: Nitropaste PRN  ?? BMP and CBC every Monday/Thursday  ??  Metastatic Prostate Cancer  Previously on hospice 04/2020. Follows with Dr Philomena Course and started ADT +abi/pred. Metastatic prostate cancer with recent cord compression (T5-T6) and worsening??left arm??weakness, MRI total spine from 9/14 shows diffuse metastatic disease with epidural thickening and spinal canal stenosis from C3-5 without cord signal change, and left foraminal invasion at C5-6. SRN felt patient was not surgical candidate and patient started on radiation therapy and steroid taper. PSA elevated to 1694 on 10/28.  - Rad/Onc consult  - Radiation treatment Monday-Friday (ended 10/19)  - Zytiga 1000 mg daily- needs CMP every couple of weeks   - Prednisone taper: 30mg  (10/15-10/17); 20mg  (10/18-10/20);10mg  (10/21-10/23) and 5mg  (10/24-)   - Lupron 7.5mg  monthly (last dose 10/20), to f/u in clinic for q3 month injection after discharge    Radiation induced esophagitis  Patient noting increased discomfort with swallowing medications and food following completion of radiation. Per  oncology should improve in 1-2 weeks.  - GI cocktail BID  - Liquid formulation of medication where possible  ??  HTN  - Amlodipine 5 mg daily  ??  HLD  - Lipitor 40 mg daily  ??  Pancytopenia  Chronic inflammatory and iron deficiency. Noted to have slow down trending blood cell counts. On 10/27 Plt-88, Hgb-7.1, WBC-2.6. Oncology felt may be combination of zytiga and radiation, however requested further studies to evaluate. Folate-18.5, LDH-136.  Etiologies of pancytopenia could include low production due to nutritional deficiencies (B12 wnl 04/2021, iron studies c/w ACD, folate level not checked) vs bone marrow infiltrative process (though PSA downtrending as of 04/27/21) vs less likely recent radiation therapy vs less likely destructive process (TBili wnl 05/17/21). Unlikely that his ongoing treatment for prostate cancer is causing myelosuppression.  - Oncology consult  - F/u pathology smear  - Ferrous sulfate 350 mg every other day  ??  CKD  Baseline Cr ~1.0-1.2.  -BMP MTh  ??  DVT  Left peroneal and politeal veins dx 04/26/21. IVC filter placed 10/5 by VIR, needs outpt removal in 3-6 months  - Eliquis 5mg  po bid    DISPO: Patient to be discussed at weekly interdisciplinary team conference.   - EDD: 11/1  - Follow-up: PM&R, PCP, Rad/Onc, Palliative    SUBJECTIVE:     Interval Events: See above.    OBJECTIVE:     Vital signs (last 24 hours):  Temp:  [37.1 ??C (98.8 ??F)-37.2 ??C (99 ??F)] 37.2 ??C (99 ??F)  Heart Rate:  [77-78] 78  Resp:  [16-18] 16  BP: (122-123)/(69-70) 122/69  MAP (mmHg):  [85-87] 85  SpO2:  [97 %] 97 %    Intake/Output (last 3 shifts):  No intake/output data recorded.    Physical Exam:   GEN: NAD  EYES: sclera anicteric, conjunctiva clear   HEENT: NCAT  NECK: trachea midline  CV: warm extremities, well perfused, no cyanosis,   RESP:  NWOB on RA  ABD: Not distended  SKIN: no visible masses, lesions, rashes, ecchymoses.   MSK: no notable contractures or ext swelling  NEURO:Alert, MAE, speech fluid and coherent, responds appropriately to questions   PSYCH: mood euthymic, affect appropriate, thought process logical         Medications:  Scheduled   ??? abiraterone (ZYTIGA) tablet 1,000 mg **Patient Supplied** Daily   ??? lidocaine (XYLOCAINE) 2% viscous mucosal solution BID    And   ??? sucralfate (CARAFATE) oral suspension BID    And   ??? aluminum-magnesium hydroxide-simethicone (MAALOX MAX) 80-80-8 mg/mL oral suspension BID   ??? amLODIPine (NORVASC) tablet 5 mg Daily   ??? apixaban (ELIQUIS) tablet 5 mg BID   ??? atorvastatin (LIPITOR) tablet 40 mg Daily   ??? DULoxetine (CYMBALTA) DR capsule 60 mg daily   ??? ferrous sulfate tablet 325 mg Every other day   ??? leuprolide (LUPRON) injection 7.5 mg Q28 Days   ??? lidocaine (LIDODERM) 5 % patch 3 patch Daily   ??? MORPhine (MS CONTIN) 12 hr tablet 15 mg Q8H SCH   ??? predniSONE (DELTASONE) tablet 5 mg Daily     PRN acetaminophen, 650 mg, Q6H PRN  albuterol, 2 puff, Q4H PRN  lidocaine 2% viscous, 10 mL, Q4H PRN   And  sucralfate, 1 g, Q4H PRN   And  aluminum-magnesium hydroxide-simethicone, 30 mL, Q4H PRN  bisacodyL, 10 mg, Daily PRN  calcium carbonate, 200 mg of elem calcium, TID PRN  guaiFENesin, 100 mg, Q4H PRN  melatonin,  3 mg, Nightly PRN  ondansetron, 4 mg, Q6H PRN  oxyCODONE, 5 mg, Q4H PRN   Or  oxyCODONE, 10 mg, Q4H PRN  polyethylene glycol, 17 g, Daily PRN  simethicone, 80 mg, TID PRN      Labs/Studies: Reviewed     Radiology Results: no new      Sunday Shams, MD  PGY-2 - Physical Medicine & Rehabilitation

## 2021-05-21 NOTE — Unmapped (Signed)
Patient is alert and oriented x 4 ,on scheduled pain meds ,observed difficulty in swallowing ,Continent of B/B ,safety maintained, call bell within reach,bed in lowest position.      Problem: Rehabilitation (IRF) Plan of Care  Goal: Plan of Care Review  Outcome: Progressing  Goal: Patient-Specific Goal (Individualized)  Outcome: Progressing  Goal: Absence of New-Onset Illness or Injury  Outcome: Progressing  Intervention: Prevent Fall and Fall Injury  Recent Flowsheet Documentation  Taken 05/21/2021 0000 by Orland Dec, RN  Safety Interventions: fall reduction program maintained  Taken 05/20/2021 2200 by Orland Dec, RN  Safety Interventions: fall reduction program maintained  Taken 05/20/2021 2000 by Orland Dec, RN  Safety Interventions: fall reduction program maintained  Intervention: Prevent Infection  Recent Flowsheet Documentation  Taken 05/20/2021 2200 by Orland Dec, RN  Infection Prevention: hand hygiene promoted  Taken 05/20/2021 2000 by Orland Dec, RN  Infection Prevention: hand hygiene promoted  Intervention: Prevent VTE (Venous Thromboembolism)  Recent Flowsheet Documentation  Taken 05/20/2021 2200 by Orland Dec, RN  VTE Prevention/Management: anticoagulant therapy  Goal: Optimal Comfort and Wellbeing  Outcome: Progressing  Goal: Home and Community Transition Plan Established  Outcome: Progressing     Problem: Fall Injury Risk  Goal: Absence of Fall and Fall-Related Injury  Outcome: Progressing  Intervention: Promote Injury-Free Environment  Recent Flowsheet Documentation  Taken 05/21/2021 0000 by Orland Dec, RN  Safety Interventions: fall reduction program maintained  Taken 05/20/2021 2200 by Orland Dec, RN  Safety Interventions: fall reduction program maintained  Taken 05/20/2021 2000 by Orland Dec, RN  Safety Interventions: fall reduction program maintained     Problem: Self-Care Deficit  Goal: Improved Ability to Complete Activities of Daily Living  Outcome: Progressing     Problem: Skin Injury Risk Increased  Goal: Skin Health and Integrity  Outcome: Progressing  Intervention: Optimize Skin Protection  Recent Flowsheet Documentation  Taken 05/21/2021 0000 by Orland Dec, RN  Pressure Reduction Techniques: frequent weight shift encouraged  Pressure Reduction Devices: heel offloading device utilized  Taken 05/20/2021 2200 by Orland Dec, RN  Pressure Reduction Techniques: frequent weight shift encouraged  Pressure Reduction Devices: heel offloading device utilized  Skin Protection: incontinence pads utilized  Taken 05/20/2021 2000 by Orland Dec, RN  Pressure Reduction Techniques: frequent weight shift encouraged  Head of Bed (HOB) Positioning: HOB elevated  Pressure Reduction Devices: heel offloading device utilized  Skin Protection: incontinence pads utilized     Problem: Hypertension Comorbidity  Goal: Blood Pressure in Desired Range  Outcome: Progressing

## 2021-05-21 NOTE — Unmapped (Signed)
Patient A&O x 4, continent of bowel and bladder. Skin intact. Swallow with pain. Neck and shoulder pain manage with Q4 PRN oxycodone. Call bell and phone within reach.  Problem: Rehabilitation (IRF) Plan of Care  Goal: Plan of Care Review  Outcome: Progressing  Goal: Patient-Specific Goal (Individualized)  Outcome: Progressing  Goal: Absence of New-Onset Illness or Injury  Outcome: Progressing  Intervention: Prevent Fall and Fall Injury  Recent Flowsheet Documentation  Taken 05/20/2021 1800 by Lewis Moccasin, RN BSN  Safety Interventions: fall reduction program maintained  Taken 05/20/2021 1600 by Lewis Moccasin, RN BSN  Safety Interventions: fall reduction program maintained  Taken 05/20/2021 1400 by Lewis Moccasin, RN BSN  Safety Interventions: fall reduction program maintained  Taken 05/20/2021 1200 by Lewis Moccasin, RN BSN  Safety Interventions: fall reduction program maintained  Taken 05/20/2021 1000 by Lewis Moccasin, RN BSN  Safety Interventions: fall reduction program maintained  Taken 05/20/2021 0800 by Lewis Moccasin, RN BSN  Safety Interventions:   fall reduction program maintained   low bed   nonskid shoes/slippers when out of bed  Intervention: Prevent VTE (Venous Thromboembolism)  Recent Flowsheet Documentation  Taken 05/20/2021 0800 by Lewis Moccasin, RN BSN  VTE Prevention/Management: anticoagulant therapy  Goal: Optimal Comfort and Wellbeing  Outcome: Progressing  Goal: Home and Community Transition Plan Established  Outcome: Progressing     Problem: Fall Injury Risk  Goal: Absence of Fall and Fall-Related Injury  Outcome: Progressing  Intervention: Promote Injury-Free Environment  Recent Flowsheet Documentation  Taken 05/20/2021 1800 by Lewis Moccasin, RN BSN  Safety Interventions: fall reduction program maintained  Taken 05/20/2021 1600 by Lewis Moccasin, RN BSN  Safety Interventions: fall reduction program maintained  Taken 05/20/2021 1400 by Lewis Moccasin, RN BSN  Safety Interventions: fall reduction program maintained  Taken 05/20/2021 1200 by Lewis Moccasin, RN BSN  Safety Interventions: fall reduction program maintained  Taken 05/20/2021 1000 by Lewis Moccasin, RN BSN  Safety Interventions: fall reduction program maintained  Taken 05/20/2021 0800 by Lewis Moccasin, RN BSN  Safety Interventions:   fall reduction program maintained   low bed   nonskid shoes/slippers when out of bed     Problem: Self-Care Deficit  Goal: Improved Ability to Complete Activities of Daily Living  Outcome: Progressing     Problem: Skin Injury Risk Increased  Goal: Skin Health and Integrity  Outcome: Progressing     Problem: Hypertension Comorbidity  Goal: Blood Pressure in Desired Range  Outcome: Progressing

## 2021-05-22 LAB — CBC W/ AUTO DIFF
BASOPHILS ABSOLUTE COUNT: 0 10*9/L (ref 0.0–0.1)
BASOPHILS RELATIVE PERCENT: 0.8 %
EOSINOPHILS ABSOLUTE COUNT: 0 10*9/L (ref 0.0–0.5)
EOSINOPHILS RELATIVE PERCENT: 1.2 %
HEMATOCRIT: 24.2 % — ABNORMAL LOW (ref 39.0–48.0)
HEMOGLOBIN: 7.8 g/dL — ABNORMAL LOW (ref 12.9–16.5)
LYMPHOCYTES ABSOLUTE COUNT: 0.3 10*9/L — ABNORMAL LOW (ref 1.1–3.6)
LYMPHOCYTES RELATIVE PERCENT: 13 %
MEAN CORPUSCULAR HEMOGLOBIN CONC: 32.3 g/dL (ref 32.0–36.0)
MEAN CORPUSCULAR HEMOGLOBIN: 30.6 pg (ref 25.9–32.4)
MEAN CORPUSCULAR VOLUME: 94.9 fL (ref 77.6–95.7)
MEAN PLATELET VOLUME: 7.4 fL (ref 6.8–10.7)
MONOCYTES ABSOLUTE COUNT: 0.4 10*9/L (ref 0.3–0.8)
MONOCYTES RELATIVE PERCENT: 13.8 %
NEUTROPHILS ABSOLUTE COUNT: 1.9 10*9/L (ref 1.8–7.8)
NEUTROPHILS RELATIVE PERCENT: 71.2 %
NUCLEATED RED BLOOD CELLS: 0 /100{WBCs} (ref <=4)
PLATELET COUNT: 125 10*9/L — ABNORMAL LOW (ref 150–450)
RED BLOOD CELL COUNT: 2.55 10*12/L — ABNORMAL LOW (ref 4.26–5.60)
RED CELL DISTRIBUTION WIDTH: 19.8 % — ABNORMAL HIGH (ref 12.2–15.2)
WBC ADJUSTED: 2.7 10*9/L — ABNORMAL LOW (ref 3.6–11.2)

## 2021-05-22 LAB — BASIC METABOLIC PANEL
ANION GAP: 8 mmol/L (ref 5–14)
BLOOD UREA NITROGEN: 36 mg/dL — ABNORMAL HIGH (ref 9–23)
BUN / CREAT RATIO: 24
CALCIUM: 10.4 mg/dL (ref 8.7–10.4)
CHLORIDE: 107 mmol/L (ref 98–107)
CO2: 31.8 mmol/L — ABNORMAL HIGH (ref 20.0–31.0)
CREATININE: 1.52 mg/dL — ABNORMAL HIGH
EGFR CKD-EPI (2021) MALE: 50 mL/min/{1.73_m2} — ABNORMAL LOW (ref >=60)
GLUCOSE RANDOM: 105 mg/dL (ref 70–179)
POTASSIUM: 4.3 mmol/L (ref 3.4–4.8)
SODIUM: 147 mmol/L — ABNORMAL HIGH (ref 135–145)

## 2021-05-22 MED ORDER — PREDNISONE 5 MG TABLET
ORAL_TABLET | Freq: Every day | ORAL | 0 refills | 30 days | Status: CP
Start: 2021-05-22 — End: 2021-06-21
  Filled 2021-05-23: qty 30, 30d supply, fill #0

## 2021-05-22 MED ORDER — OXYCODONE 5 MG TABLET
ORAL_TABLET | ORAL | 0 refills | 7.00000 days | Status: CP | PRN
Start: 2021-05-22 — End: 2021-05-29
  Filled 2021-05-23: qty 84, 7d supply, fill #0

## 2021-05-22 MED ORDER — ATORVASTATIN 40 MG TABLET
ORAL_TABLET | Freq: Every day | ORAL | 0 refills | 30 days | Status: CP
Start: 2021-05-22 — End: 2021-06-21
  Filled 2021-05-23: qty 30, 30d supply, fill #0

## 2021-05-22 MED ORDER — LIDOCAINE 5 % TOPICAL PATCH
MEDICATED_PATCH | Freq: Every day | TRANSDERMAL | 0 refills | 15 days | Status: CP
Start: 2021-05-22 — End: 2021-05-22

## 2021-05-22 MED ORDER — APIXABAN 5 MG TABLET
ORAL_TABLET | Freq: Two times a day (BID) | ORAL | 0 refills | 30 days | Status: CP
Start: 2021-05-22 — End: 2021-06-21
  Filled 2021-05-23: qty 60, 30d supply, fill #0

## 2021-05-22 MED ORDER — DULOXETINE 60 MG CAPSULE,DELAYED RELEASE
ORAL_CAPSULE | Freq: Every day | ORAL | 0 refills | 30 days | Status: CP
Start: 2021-05-22 — End: 2021-06-21
  Filled 2021-05-23: qty 30, 30d supply, fill #0

## 2021-05-22 MED ORDER — MORPHINE ER 15 MG TABLET,EXTENDED RELEASE
ORAL_TABLET | Freq: Two times a day (BID) | ORAL | 0 refills | 7.00000 days | Status: CP
Start: 2021-05-22 — End: 2021-05-29
  Filled 2021-05-23: qty 14, 7d supply, fill #0

## 2021-05-22 MED ORDER — FEROSUL 325 MG (65 MG IRON) TABLET
ORAL_TABLET | ORAL | 0 refills | 30.00000 days | Status: CP
Start: 2021-05-22 — End: 2021-06-21
  Filled 2021-05-23: qty 15, 30d supply, fill #0

## 2021-05-22 MED ORDER — AMLODIPINE 5 MG TABLET
ORAL_TABLET | Freq: Every day | ORAL | 0 refills | 30 days | Status: CP
Start: 2021-05-22 — End: 2021-06-21
  Filled 2021-05-23: qty 30, 30d supply, fill #0

## 2021-05-22 MED ADMIN — sucralfate (CARAFATE) oral suspension: 1 g | ORAL | @ 01:00:00

## 2021-05-22 MED ADMIN — atorvastatin (LIPITOR) tablet 40 mg: 40 mg | ORAL | @ 12:00:00

## 2021-05-22 MED ADMIN — lidocaine (XYLOCAINE) 2% viscous mucosal solution: 10 mL | ORAL | @ 01:00:00

## 2021-05-22 MED ADMIN — aluminum-magnesium hydroxide-simethicone (MAALOX MAX) 80-80-8 mg/mL oral suspension: 30 mL | ORAL | @ 12:00:00 | Stop: 2021-05-22

## 2021-05-22 MED ADMIN — oxyCODONE (ROXICODONE) 5 mg/5 mL solution 10 mg: 10 mg | ORAL | @ 20:00:00 | Stop: 2021-05-29

## 2021-05-22 MED ADMIN — lidocaine (LIDODERM) 5 % patch 3 patch: 3 | TRANSDERMAL | @ 12:00:00

## 2021-05-22 MED ADMIN — lidocaine (XYLOCAINE) 2% viscous mucosal solution: 10 mL | ORAL | @ 12:00:00 | Stop: 2021-05-22

## 2021-05-22 MED ADMIN — MORPhine (MS CONTIN) 12 hr tablet 15 mg: 15 mg | ORAL | @ 02:00:00 | Stop: 2021-05-30

## 2021-05-22 MED ADMIN — MORPhine (MS CONTIN) 12 hr tablet 15 mg: 15 mg | ORAL | @ 19:00:00 | Stop: 2021-05-30

## 2021-05-22 MED ADMIN — apixaban (ELIQUIS) tablet 5 mg: 5 mg | ORAL | @ 12:00:00

## 2021-05-22 MED ADMIN — DULoxetine (CYMBALTA) DR capsule 60 mg: 60 mg | ORAL | @ 09:00:00

## 2021-05-22 MED ADMIN — ferrous sulfate tablet 325 mg: 325 mg | ORAL | @ 12:00:00

## 2021-05-22 MED ADMIN — predniSONE (DELTASONE) tablet 5 mg: 5 mg | ORAL | @ 12:00:00

## 2021-05-22 MED ADMIN — aluminum-magnesium hydroxide-simethicone (MAALOX MAX) 80-80-8 mg/mL oral suspension: 30 mL | ORAL | @ 01:00:00

## 2021-05-22 MED ADMIN — MORPhine (MS CONTIN) 12 hr tablet 15 mg: 15 mg | ORAL | @ 09:00:00 | Stop: 2021-05-30

## 2021-05-22 MED ADMIN — amLODIPine (NORVASC) tablet 5 mg: 5 mg | ORAL | @ 12:00:00

## 2021-05-22 MED ADMIN — abiraterone (ZYTIGA) tablet 1,000 mg **Patient Supplied**: 1000 mg | ORAL | @ 12:00:00

## 2021-05-22 MED ADMIN — apixaban (ELIQUIS) tablet 5 mg: 5 mg | ORAL | @ 01:00:00

## 2021-05-22 NOTE — Unmapped (Signed)
Patient is alert and oriented x 4 ,on scheduled pain meds ,difficulty in swallowing persists ,safety maintained ,call bell within reach ,bed in lowest position.          Problem: Rehabilitation (IRF) Plan of Care  Goal: Plan of Care Review  Outcome: Progressing  Goal: Patient-Specific Goal (Individualized)  Outcome: Progressing  Goal: Absence of New-Onset Illness or Injury  Outcome: Progressing  Intervention: Prevent Fall and Fall Injury  Recent Flowsheet Documentation  Taken 05/22/2021 0000 by Orland Dec, RN  Safety Interventions: fall reduction program maintained  Taken 05/21/2021 2200 by Orland Dec, RN  Safety Interventions: fall reduction program maintained  Taken 05/21/2021 2000 by Orland Dec, RN  Safety Interventions: fall reduction program maintained  Intervention: Prevent Infection  Recent Flowsheet Documentation  Taken 05/22/2021 0000 by Orland Dec, RN  Infection Prevention: hand hygiene promoted  Taken 05/21/2021 2200 by Orland Dec, RN  Infection Prevention: hand hygiene promoted  Taken 05/21/2021 2000 by Orland Dec, RN  Infection Prevention: hand hygiene promoted  Goal: Optimal Comfort and Wellbeing  Outcome: Progressing  Goal: Home and Community Transition Plan Established  Outcome: Progressing     Problem: Fall Injury Risk  Goal: Absence of Fall and Fall-Related Injury  Outcome: Progressing  Intervention: Promote Injury-Free Environment  Recent Flowsheet Documentation  Taken 05/22/2021 0000 by Orland Dec, RN  Safety Interventions: fall reduction program maintained  Taken 05/21/2021 2200 by Orland Dec, RN  Safety Interventions: fall reduction program maintained  Taken 05/21/2021 2000 by Orland Dec, RN  Safety Interventions: fall reduction program maintained     Problem: Self-Care Deficit  Goal: Improved Ability to Complete Activities of Daily Living  Outcome: Progressing     Problem: Skin Injury Risk Increased  Goal: Skin Health and Integrity  Outcome: Progressing  Intervention: Optimize Skin Protection  Recent Flowsheet Documentation  Taken 05/22/2021 0000 by Orland Dec, RN  Pressure Reduction Techniques: frequent weight shift encouraged  Pressure Reduction Devices: pressure-redistributing mattress utilized  Skin Protection: incontinence pads utilized  Taken 05/21/2021 2300 by Orland Dec, RN  Pressure Reduction Techniques: frequent weight shift encouraged  Pressure Reduction Devices: pressure-redistributing mattress utilized  Skin Protection: incontinence pads utilized  Taken 05/21/2021 2200 by Orland Dec, RN  Pressure Reduction Techniques: frequent weight shift encouraged  Pressure Reduction Devices: pressure-redistributing mattress utilized  Skin Protection: incontinence pads utilized  Taken 05/21/2021 2000 by Orland Dec, RN  Pressure Reduction Techniques: frequent weight shift encouraged  Head of Bed (HOB) Positioning: HOB elevated  Pressure Reduction Devices: heel offloading device utilized  Skin Protection: incontinence pads utilized     Problem: Hypertension Comorbidity  Goal: Blood Pressure in Desired Range  Outcome: Progressing

## 2021-05-22 NOTE — Unmapped (Signed)
Discharge Services and Resources:  ??  Home Health Services:  Home Health  Physical Therapy and Occupational Therapy has been set up with [facility] at [phone].??Start of Care for Home Health PT will be within 48 hours of discharge. If you have not been contacted by Home Health by Thursday Nov 3rd, please call the above number regarding services.     If you have any questions regarding home health please call the above number.      You will need to follow up with your Personal Care Physician or your Neurologist regarding setting up outpatient physical therapy and occupational therapy after Home Health has discharged. You are unable to start outpatient therapy until home health has discharged you.   ??  Home Medical Equipment: (To be delivered to your hospital room prior to discharge)  ??  Slide Board  [employer] copywriter   ??  [facility]  [phone]  ??  If you have any questions regarding your medical equipment after discharge, please call the above number.   ??  Disability Parking Placard  You have received an application for a disability parking placard that you can obtain at your local [employer] for $5.

## 2021-05-22 NOTE — Unmapped (Signed)
Palliative Care Progress Note    Consultation from Requesting Attending Physician:  Sheryle Spray*  Primary Care Provider:  Abram Sander, MD       Code Status: Full Code   Healthcare decision-maker if lacks capacity:   HCDM (patient stated preference): Noodwood,Catherine - Daughter - [phone]     Assessment/Plan:      SUMMARY:  Mr. MAXWEL MEADOWCROFT is a very pleasant 70 y.o. male with metastatic prostate cancer and widespread bone metastasis throughout spine. He was admitted for worsening pain and upper extremity weakness, complicated by co-morbid acute and chronic conditions including h/o spinal cord compression. He has since transitioned from the main medical center to [facility] for continued care.     Palliative care continued to follow prn for reassessment of symptoms and regimen.     Symptom Assessment and Recommendations:      1. Left shoulder, arm and neck pain due to cancer:   Has not utilized prn medication since 10/29    For discharge:  - MS Contin 15 mg PO BID.   Transitioning back to BID from TID as Mr. Markovic feels like the daytime dose makes him too sleepy.  Would ask pharmacy to do patient med teaching for DC; attention to not crushing extended release medication.    - Continue oxycodone 10 mg PO every 4 hours prn pain.   Will DC with oxycodone tabs as insurance will not cover liquid.   Pt would not tolerate taste of oral liquid morphine for breakthrough, therefore will not change to this. Appreciate pharmacy patient med education; how to crush, make slurry / mix with food   - Continue duloxetine 60 mg PO daily.   - Continue lidocaine patches to left deltoid and neck.    He will be followed by the [facility] clinic at discharge. Team is aware and will contact Mr. Cranfield to schedule his outpatient follow up.    ??  2. Prevention of opioid-induced constipation:   - Continue senna 2 tabs nightly and miralax daily as needed given frequent stools.     Goals of care / ACP:    Decisional capacity at time of visit:  Able to make decisions for himself.     Current goals of care:  Did not discuss today.      Practical, Emotional, Spiritual Support / Other Communication and Counseling:    Brief visit focused on symptom evaluation and discussion of regimen, but provided compassionate support and active listening.      --Communication with attending [facility] team and [facility] team regarding Mr. Heckmann's care and pending discharge with need for [facility] follow up.    Please page Palliative Care [phone]) if there are any questions.     Subjective:   Continues to endorse painful swallowing which may be slightly better now than it was. He is sipping liquids but admits he is not able to tolerate a lot of solid food unless it is soft.     Shares that midday dose of MS Contin makes him too sleepy. He would like to continue a.m and evening dose but prefers to use shorting acting oxycodone during the midday prn for pain.    Denies pain in mouth, but rather pain is further down esophagus. Denies nausea, vomiting, constipation, diarrhea, fever, chills     Feels like he is going to move his bowels today.    Recent Events: preparing for discharge home on 11/1; family training completed  No updates to past / social / family history    Scheduled Meds:  ??? abiraterone  1,000 mg Oral Daily   ??? amLODIPine  5 mg Oral Daily   ??? apixaban  5 mg Oral BID   ??? atorvastatin  40 mg Oral Daily   ??? DULoxetine  60 mg Oral daily   ??? ferrous sulfate  325 mg Oral Every other day   ??? leuprolide  7.5 mg Intramuscular Q28 Days   ??? lidocaine  3 patch Transdermal Daily   ??? MORPhine  15 mg Oral Q8H SCH   ??? predniSONE  5 mg Oral Daily     Continuous Infusions:  PRN Meds:.acetaminophen, albuterol, lidocaine 2% viscous **AND** sucralfate **AND** aluminum-magnesium hydroxide-simethicone, bisacodyL, calcium carbonate, guaiFENesin, melatonin, ondansetron, oxyCODONE **OR** oxyCODONE, polyethylene glycol, simethicone     Objective: Seen in room. NAD. Sitting up in wheelchair.    Function:  50% - Ambulation: Mainly sit/lie / Unable to do any work, extensive disease / Self-Care:Considerable assistance required, Intake: Normal or reduced / Level of Conscious: Full or confusion    Temp:  [36.8 ??C (98.2 ??F)-36.9 ??C (98.4 ??F)] 36.9 ??C (98.4 ??F)  Heart Rate:  [77-78] 78  Resp:  [18] 18  BP: (107-111)/(62-68) 111/68  SpO2:  [97 %-100 %] 97 %    Physical Exam:  Constitutional: Sitting in WC. appears comfortable, NAD, extremely pleasant  Eyes: no discharge, grossly normal vision  Pulm: normal work of breathing, no accessory muscle use. Non-productive cough  MSK: no sarcopenia, some tenderness to palpation on inferior aspect of left deltoid, left anterior neck  Skin: no obvious rashes  Neuro: A&Ox4, only able to passively lift left arm but can move L wrist/hand independently  Psych: mood and affect appropriate for situation and congruent    Test Results:  Lab Results   Component Value Date    WBC 2.7 (L) 05/22/2021    RBC 2.55 (L) 05/22/2021    HGB 7.8 (L) 05/22/2021    HCT 24.2 (L) 05/22/2021    MCV 94.9 05/22/2021    MCH 30.6 05/22/2021    MCHC 32.3 05/22/2021    RDW 19.8 (H) 05/22/2021    PLT 125 (L) 05/22/2021    MPV 7.4 05/22/2021     Lab Results   Component Value Date    NA 147 (H) 05/22/2021    K 4.3 05/22/2021    CL 107 05/22/2021    CO2 31.8 (H) 05/22/2021    BUN 36 (H) 05/22/2021    CREATININE 1.52 (H) 05/22/2021    GLU 105 05/22/2021    CALCIUM 10.4 05/22/2021    ALBUMIN 2.8 (L) 05/17/2021    PHOS 2.9 05/06/2021      Lab Results   Component Value Date    ALKPHOS 555 (H) 05/17/2021    BILITOT 0.4 05/17/2021    PROT 5.2 (L) 05/17/2021    ALBUMIN 2.8 (L) 05/17/2021    ALT <7 (L) 05/17/2021    AST 19 05/17/2021       I personally spent 20 minutes on the floor or unit in direct patient care. The direct patient care time included face-to-face time with the patient, reviewing the patient's chart, communicating with the family and/or other professionals and coordinating care.     Greater than 50% of this time spent on counseling/coordination of care:  Yes.   See ACP Note from today for additional billable service:  No.    Anell Barr, DNP, ANP-BC  Pager [phone]

## 2021-05-22 NOTE — Unmapped (Signed)
RADIATION ONCOLOGY TREATMENT COMPLETION NOTE    Encounter Date: 05/22/2021  Patient Name: Kevin Cohen  Medical Record Number: [id number]    Referring Physician: No referring provider defined for this encounter.    Primary Care Provider: Abram Sander, MD    DIAGNOSIS:  Metastatic prostate cancer with known osseous mets (s/p multiple rounds of chemotherapy and radiation to the prostate and Spine T5-T7 at [facility] health). Now with cervical myelopathy and several week history of worsening LUE weakness. MRI total spine from 9/14 shows diffuse metastatic disease with epidural thickening and spinal canal stenosis from C3-5 without cord signal change, and left foraminal invasion at C5-6.     TREATMENT INTENT: palliative    CLINICAL TRIAL: no    CHEMOTHERAPY: not administered    RADIATION TREATMENT SUMMARY:          Treatment site Treatment Technique/Modality Energy Dose per fraction Total number  of fractions Total dose Start date End date   C4-T2 3D CRT 6/15 MV/MeV 300 cGy 10 3000 cGy 04/27/2021   05/10/2021     COMPLETED INTENDED COURSE:  yes    TREATMENT BREAK > 2 WEEKS:  no    TOLERANCE TO TREATMENT:    Esophagits: Grade 1    PLAN FOR FOLLOW-UP: CAYLIN RABY is to return for follow up on an as needed basis.    Kayleen Memos. Camelia Phenes, MD  Assistant Professor  Department of Radiation Oncology  [facility]  [address]  [place], [place] [address]  O: [phone]  05/22/21 11:52 AM

## 2021-05-22 NOTE — Unmapped (Signed)
Patient A&O x 4, continent of bowel and bladder. Skin intact. Swallow with pain. Neck and shoulder pain manage with Q4 PRN oxycodone. Call bell and phone within reach.  Problem: Rehabilitation (IRF) Plan of Care  Goal: Plan of Care Review  Outcome: Progressing  Goal: Patient-Specific Goal (Individualized)  Outcome: Progressing  Goal: Absence of New-Onset Illness or Injury  Outcome: Progressing  Intervention: Prevent Fall and Fall Injury  Recent Flowsheet Documentation  Taken 05/21/2021 1600 by Lewis Moccasin, RN BSN  Safety Interventions: fall reduction program maintained  Taken 05/21/2021 1400 by Lewis Moccasin, RN BSN  Safety Interventions: fall reduction program maintained  Taken 05/21/2021 1200 by Lewis Moccasin, RN BSN  Safety Interventions: fall reduction program maintained  Taken 05/21/2021 1000 by Lewis Moccasin, RN BSN  Safety Interventions: fall reduction program maintained  Taken 05/21/2021 0800 by Lewis Moccasin, RN BSN  Safety Interventions:   fall reduction program maintained   low bed   nonskid shoes/slippers when out of bed  Intervention: Prevent VTE (Venous Thromboembolism)  Recent Flowsheet Documentation  Taken 05/21/2021 0900 by Lewis Moccasin, RN BSN  VTE Prevention/Management: anticoagulant therapy  Goal: Optimal Comfort and Wellbeing  Outcome: Progressing  Goal: Home and Community Transition Plan Established  Outcome: Progressing     Problem: Fall Injury Risk  Goal: Absence of Fall and Fall-Related Injury  Outcome: Progressing  Intervention: Promote Injury-Free Environment  Recent Flowsheet Documentation  Taken 05/21/2021 1600 by Lewis Moccasin, RN BSN  Safety Interventions: fall reduction program maintained  Taken 05/21/2021 1400 by Lewis Moccasin, RN BSN  Safety Interventions: fall reduction program maintained  Taken 05/21/2021 1200 by Lewis Moccasin, RN BSN  Safety Interventions: fall reduction program maintained  Taken 05/21/2021 1000 by Lewis Moccasin, RN BSN  Safety Interventions: fall reduction program maintained  Taken 05/21/2021 0800 by Lewis Moccasin, RN BSN  Safety Interventions:   fall reduction program maintained   low bed   nonskid shoes/slippers when out of bed     Problem: Self-Care Deficit  Goal: Improved Ability to Complete Activities of Daily Living  Outcome: Progressing     Problem: Skin Injury Risk Increased  Goal: Skin Health and Integrity  Outcome: Progressing     Problem: Hypertension Comorbidity  Goal: Blood Pressure in Desired Range  Outcome: Progressing

## 2021-05-22 NOTE — Unmapped (Signed)
[facility] Physical Medicine and Rehab  Discharge Summary    Patient Name: Kevin Cohen       Medical Record Number: [id number]   Date of Birth: 1950/11/27  Sex: Male          Room/Bed: 4H212/4H212-01  Payor Info: Payor: [employer] MEDICARE ADV / Plan: Francine Graven GOLD PLUS HMO / Product Type: *No Product type* /      Admit Date: 05/06/2021  Discharge Date: 05/23/2021  Admitting Physician: Darrick Grinder, MD  Discharge Physician: Sena Hitch, M.D.  Rehab Impairment Group Code [facility]): (Spinal Cord Dysfunction - Non-Traumatic) [id number] Quadriplegia, Incomplete C5-8  Etiology: Metastatic Prostate Cancer    Follow-up Outpatient Issues:   [ ]  Monitor Pancytopenia  [ ]  Evaluate for IVC filter removal in 2-5 months. Determine length of eliquis  [ ]  Monitor pain with palliative    Admission Functional Status: Discharge Functional Status:   Current Functional Status:  Feeding - Needs Assistance: Set Up Assist  ??  Grooming - Needs Assistance: Min assist  ??  Bathing - Needs Assistance: Min assist  ??  Toileting - Needs Assistance: Min assist  ??  UB Dressing - Needs Assistance: Min assist  ??  LB Dressing - Needs Assistance: Min assist; Performed seated  ??  IADLs: NT  ??  ??  Mobility:??  Bed Mobility: min assist with all  ??  Transfers: Min A  ??  Gait: amb ~61ft without device with mod assist x 1. flexed posture, difficulty unweighting LE to advance  ??  Stairs: NA  ??  Wheelchair Mobility: NA  ??  ??  Cognition/Swallow/Speech:AOx4, clear speech, following commands, regular diet  ??  DME Recommendations:  PT DME Recommendations: Defer to post acute  ??  OT DME Recommendations: Defer to post acute  ?? PHYSICAL THERAPY   Discharge Summary: Kevin Cohen is a 70 y.o. male  with a past medical history of metastatic prostate cancer and known bony metastases s/p chemo+RT admitted for cervical myelopathy with a neurologic level of C5 in the setting of incomplete SCI. He is now admitted to [facility] for comprehensive interdisciplinary rehabilitation. Patient has remained motivated to participate in acute inpatient rehab skilled PT but has been limited secondary to B shoulder pain, esophageal pain and decreased endurance. Patient has been limited with overall intake secondary to esophageal pain over the last week limiting activity tolerance and endurance. At time of discharge, patient requiring varying levels of assistance (CGA<>mod A) with OOB transfers secondary to decreased muscular endurance and decreased B LE power. Patient with impaired postural alignment and increased forward flexed posture limiting ability to utilize RW and UE to full potential for offloading of B LE. Patient continues to demonstrate B knee buckling during periods of increased fatigue limiting overall safety and leading to increased fall risk. PT educated pt on L platform on RW for improved upright postural alignment and pt deferring reporting he will not use attachment. Daughter understandable of PT recommendations of ramp installation to maximize patient safety and pt's daughter demonstrating ability to assist with bumping patient up the steps in a wheelchair in the interim. Recommending HH PT and slideboard during periods of increased fatigue to maximize patient safety and to decrease caregiver burden.    OCCUPATIONAL THERAPY  Discharge Summary: Kevin Cohen is a 70 y.o. male  with a past medical history of metastatic prostate cancer and known bony metastases s/p chemo+RT admitted for cervical myelopathy with a neurologic level of C5 in the setting of incomplete  SCI. He was admitted to [facility] for comprehensive interdisciplinary rehabilitation. Patient demonstrated good progress during inpatient rehab stay. Patient is set up for feeding and grooming, MIN A for toileting, bathing, and UBD, and MOD A LBD. Patient is limited by L UE and B LE weakenss, decreased ROM, L UE and cervical pain, and decreased endurance.    SPEECH LANGUAGE PATHOLOGY       Assistive devices:     Indication for Admission / HPI:     Kevin Cohen is a 70 y.o. male ??with a past medical history of metastatic prostate cancer and known bony metastases s/p chemo+RT admitted for cervical myelopathy with a neurologic level of C5 in the setting of incomplete SCI.    Hospital Course:     REHAB:   - PT and OT to maximize functional status with mobility and ADLs as well as prevention of joint contracture.   - RT for community re-integration, education, and leisure support services.  - P&O for assistive devices PRN.  - To be discussed in weekly Interdisciplinary Team Conference.  ??  Non Traumatic, Cervical Spinal Cord Injury: Presented with several month history of symptoms concerning for cervical myelopathy and several week history of worsening LUE weakness. MRI total spine from 9/14 shows diffuse metastatic disease with epidural thickening and spinal canal stenosis from C3-5 without cord signal change, and left foraminal invasion at C5-6. Patient not surgical candidate and placed on steroid taper and radiation treatment as below.  ?? Post-operative Pain: Pain was controlled using the following medication as guided by Palliative Care Consult.   ? MS Contin 15 mg PO BID, trialed on TID but patient felt he was too sleepy and requested to switch back.  ? Oxycodone 10 mg PO every 4 hours prn pain  ? Duloxetine 30 mg PO daily x 1 week and then increase to 60 mg PO daily. First dose 10/13.  ? S/p Gabapentin to 300 gm PO nightly x 1 week (10/13-10/20)  ? Tylenol 650 mg PO q4h PRN  ? Lidocaine patch, voltaren gel PRN  ? Bladder:????Patient endorsed sensation was able to void volitionally.   ?? Bowel:??Volitional. Was initially on bowel meds but requested to discontinue due to loose stools.  ?? Equipment:  ? Place Bilateral hard PRAFO Boots to protect patient's heels from pressure injury and keep foot in neutral position to avoid PF contractures.  ??  Metastatic Prostate Cancer  Follows with Dr Philomena Course and started ADT +abi/pred. Metastatic prostate cancer with recent cord compression (T5-T6) and worsening??left arm??weakness, MRI total spine from 9/14 shows diffuse metastatic disease with epidural thickening and spinal canal stenosis from C3-5 without cord signal change, and left foraminal invasion at C5-6. SRN felt patient was not surgical candidate and patient started on radiation therapy and steroid taper. Was continued on Zytiga 1000 mg daily. Completed steroid taper to prednisone 5 mg which was continued at discharge. Was treated with radiation treatment with Rad/Onc on Monday-Friday ending 10/19 for 10 fractions. Additionally received lupron 7.5mg  injection on 10/20 with further injections as guided by Oncology outpatient. Found to have PSA elevated to 1694 on 10/28. Will follow up with oncology for further treatment options.  ??  Radiation induced esophagitis  Patient noted increased discomfort with swallowing medications and food following completion of radiation. Was switched to liquid formulation of medication where possible. Patient noted waxing and waning discomfort near discharge.   ??  Pancytopenia  Noted to have slowly downtrending blood cell counts. On 10/27 Plt-88, Hgb-7.1,  WBC-2.6. Oncology was consulted with work up showing elevated PSA to 1694 and blood smear demonstrating tear drop cells and elevated PSA may be suggestive of progressing prostate cancer. Additionally with iron deficiency and started on iron supplementation every other day. Concern was for worsening prostate cancer. Patient to follow up with oncology as above.     DVT  Left peroneal and politeal veins dx 04/26/21. IVC filter placed 10/5 by VIR, needs outpt removal in 3-6 months. Was continued on eliquis 5mg  po bid at discharge.    Consults: Oncology, Palliative    Procedures: None    Discharge Medications:      Your Medication List      STOP taking these medications    benzonatate 100 MG capsule  Commonly known as: TESSALON     gabapentin 300 MG capsule  Commonly known as: NEURONTIN     HYDROmorphone 2 MG tablet  Commonly known as: DILAUDID     lidocaine 5 % patch  Commonly known as: LIDODERM     ondansetron 4 MG tablet  Commonly known as: ZOFRAN     oxyCODONE-acetaminophen 5-325 mg per tablet  Commonly known as: PERCOCET     polyethylene glycol 17 gram packet  Commonly known as: MIRALAX     senna 8.6 mg tablet  Commonly known as: SENNA        CHANGE how you take these medications    amLODIPine 5 MG tablet  Commonly known as: NORVASC  Take 1 tablet (5 mg total) by mouth daily.  What changed: See the new instructions.     DULoxetine 60 MG capsule  Commonly known as: CYMBALTA  Take 1 capsule (60 mg total) by mouth daily.  What changed: Another medication with the same name was removed. Continue taking this medication, and follow the directions you see here.     FeroSuL 325 (65 FE) MG tablet  Generic drug: ferrous sulfate  Take 1 tablet (325 mg total) by mouth every other day.  What changed: See the new instructions.     MORPhine 15 MG 12 hr tablet  Commonly known as: MS CONTIN  Take 1 tablet (15 mg total) by mouth Two (2) times a day for 7 days.  What changed: when to take this     oxyCODONE 5 MG immediate release tablet  Commonly known as: ROXICODONE  Take 2 tablets (10 mg total) by mouth every four (4) hours as needed for pain for up to 7 days.  What changed:   ?? how much to take  ?? when to take this  ?? reasons to take this  ?? Another medication with the same name was removed. Continue taking this medication, and follow the directions you see here.     predniSONE 5 MG tablet  Commonly known as: DELTASONE  Take 1 tablet (5 mg total) by mouth daily.  What changed:   ?? when to take this  ?? Another medication with the same name was removed. Continue taking this medication, and follow the directions you see here.        CONTINUE taking these medications    abiraterone 250 mg Tab tablet  Commonly known as: ZYTIGA  Take 4 tablets (1,000 mg total) by mouth daily.     apixaban 5 mg Tab  Commonly known as: ELIQUIS  Take 1 tablet (5 mg total) by mouth Two (2) times a day.     atorvastatin 40 MG tablet  Commonly known as: LIPITOR  Take 1 tablet (  40 mg total) by mouth daily.          Significant Diagnostic Studies: Reviewed in Epic      Discharge Instructions:     Medications:  Please take all medications as prescribed below and note any changes.    Other Instructions and Information:   Follow Up instructions and Outpatient Referrals     Ambulatory referral to Home Health      Is this a [facility] or [facility] Patient?: No    Physician to follow patient's care: Referring Provider    Disciplines requested:  Physical Therapy  Occupational Therapy       Physical Therapy requested:  Home safety evaluation  Evaluate and treat       Occupational Therapy Requested:  Home safety evaluation  Evaluate and treat       Requested [facility] Date: 05/25/2021    Do you want ongoing co-management?: No    Care coordination required?: No          Follow-Up Appointments:   Please make all follow-up appointments as noted below. If not already scheduled, please set-up an appointment with a Primary Care Provider for continued general medical care.    Appointments which have been scheduled for you    May 26, 2021  8:30 AM  (Arrive by 8:00 AM)  NEW PALLIATIVE CARE with Ortencia Kick, MD  [facility] ONCOLOGY MULTIDISCIPLINARY 2ND FLR CANCER HOSP [facility] REGION) [address]  [place] [address]  [phone]      Jun 08, 2021  7:00 AM  (Arrive by 6:30 AM)  LAB ONLY [place] with ADULT ONC LAB  [facility] ADULT ONCOLOGY LAB DRAW STATION [facility] [facility] REGION) [address]  [place] [address]  [phone]      Jun 08, 2021  8:00 AM  (Arrive by 7:30 AM)  RETURN ACTIVE [place] with Maurie Boettcher, MD  [place] ONCOLOGY MULTIDISCIPLINARY 2ND FLR CANCER HOSP [facility] REGION) [address]  [place] [address]  [phone]      Jun 08, 2021  9:00 AM  (Arrive by 8:30 AM)  Danelle Earthly NURSE with [facility] INJECTION MULTI  [place] ONCOLOGY MULTIDISCIPLINARY 2ND FLR CANCER HOSP [facility] REGION) [address]  [place] [address]  [phone]           Discharge Day Services:  The patient was seen and examined on the day of discharge. Vitals signs and exam are stable. Therapy goals met. Discharge medications and instructions were discussed with the patient and family, and all questions were answered.    Time spent for discharge: 30 minutes or greater    Physical Exam:  Vitals:    05/22/21 0600   BP: 111/68   Pulse: 78   Resp: 18   Temp: 36.9 ??C (98.4 ??F)   SpO2: 97%            Physical Exam:   GEN: Pleasant gentleman sitting in WC  EYES: Sclera anicteric, conjunctiva clear   HENT: NCAT, MMM  NECK: Trachea midline  RESP:NWOB on RA  CV: Extremities warm/well perfused  GI: Non distended  SKIN: No visible masses, lesions, rashes, ecchymoses, or lacerations  MSK: FROM in RUE/RLE, limited ROM LUE/LLE.  No notable contractures, no visible swelling or erythema over joints, joints NTTP  NEURO:              Mental Status: A&Ox3, speech fluid and coherent, follows commands well  and answers questions appropriately              Cranial Nerve:  EOMI. Full facial movements and symmetric smile. Shoulder shrug full and equal.              Sensory: FROM RUE/RLE, limited ROM LUE/LLE               Motor:   - RUE 5/5 shoulder abd, 5/5 EF, 5/5 EE, 5/5 WE, 5/5 HG  - LUE 1/5 shoulder abd, 3/5 EF, 3+/5 EE, 4/5 WE, 4/5 HG  - RLE 5/5 HF, 5/5 KF, 5/5 KE, 5/5 DF, 5/5 PF  - LLE 4/5 HF, 4/5 KF, 4/5 KE, 4/5 DF, 4/5 PF              Tone: within normal limits, no spasticity noted              Cerebellar: no abnormal or extraneous movements              Gait: Deferred  PSYCH: mood euthymic, affect appropriate, thought process logical         Discharge Condition: Stable    Discharge Disposition: home with family assistance/supervision    Home Health: Physical Therapy and Occupational Therapy      Sunday Shams, MD  PGY-2 - Physical Medicine & Rehabilitation

## 2021-05-22 NOTE — Unmapped (Signed)
Physical Medicine and Rehabilitation  Daily Progress Note [facility]    ASSESSMENT:     Kevin Cohen is a 70 y.o. male ??with a past medical history of metastatic prostate cancer and known bony metastases s/p chemo+RT admitted for cervical myelopathy with a neurologic level of C5 in the setting of incomplete SCI. He is now admitted to [facility] for comprehensive interdisciplinary rehabilitation.   ??  Rehab Impairment Group Code [facility]): (Spinal Cord Dysfunction - Non-Traumatic) [id number] Quadriplegia, Incomplete C5-8  Etiology: Metastatic Prostate Cancer    PLAN:     REHAB:   - PT and OT to maximize functional status with mobility and ADLs as well as prevention of joint contracture.   - RT for community re-integration, education, and leisure support services.  - P&O for assistive devices PRN.  - To be discussed in weekly Interdisciplinary Team Conference.  ??  Non Traumatic, Cervical Spinal Cord Injury: Presented with several month history of symptoms concerning for cervical myelopathy and several week history of worsening LUE weakness. MRI total spine from 9/14 shows diffuse metastatic disease with epidural thickening and spinal canal stenosis from C3-5 without cord signal change, and left foraminal invasion at C5-6. Patient not surgical candidate and placed on steroid taper and radiation treatment as below.  ?? Post-operative Pain:  ? Palliative Care Consult  ? MS Contin 15 mg PO TID  ? Oxycodone 10 mg PO every 4 hours prn pain  ? Duloxetine 30 mg PO daily x 1 week and then increase to 60 mg PO daily. First dose 10/13.  ? S/p Gabapentin to 300 gm PO nightly x 1 week (10/13-10/20)  ? Tylenol 650 mg PO q4h PRN  ? Lidocaine patch, voltaren gel PRN  ?? Neuropathic Pain  ? CTM  ?? Spasticity: No issues with spasms currently. Will monitor.   ? CTM  ? Bladder:?? Patient endorses sensation and is able to void volitionally.   ? Strict I&O's  ?? Bowel:??Volitional. Admission KUB with mod stool burden.   ? Colace 100 mg BID PRN  ? Miralax PRN  ? Bisacodyl Suppository daily PRN  ?? Equipment:  ? Place Bilateral hard PRAFO Boots to protect patient's heels from pressure injury and keep foot in neutral position to avoid PF contractures.  ?? Monitor for Autonomic Dysreflexia: Nitropaste PRN  ?? BMP and CBC every Monday/Thursday  ??  Metastatic Prostate Cancer  Previously on hospice 04/2020. Follows with Dr Philomena Course and started ADT +abi/pred. Metastatic prostate cancer with recent cord compression (T5-T6) and worsening??left arm??weakness, MRI total spine from 9/14 shows diffuse metastatic disease with epidural thickening and spinal canal stenosis from C3-5 without cord signal change, and left foraminal invasion at C5-6. SRN felt patient was not surgical candidate and patient started on radiation therapy and steroid taper. PSA elevated to 1694 on 10/28.  - Rad/Onc consult  - Radiation treatment Monday-Friday (ended 10/19)  - Zytiga 1000 mg daily- needs CMP every couple of weeks   - Prednisone taper: 30mg  (10/15-10/17); 20mg  (10/18-10/20);10mg  (10/21-10/23) and 5mg  (10/24-)   - Lupron 7.5mg  monthly (last dose 10/20), to f/u in clinic for q3 month injection after discharge    Radiation induced esophagitis  Patient noting increased discomfort with swallowing medications and food following completion of radiation. Per oncology should improve in 1-2 weeks.  - Liquid formulation of medication where possible  ??  HTN  - Amlodipine 5 mg daily  ??  HLD  - Lipitor 40 mg daily  ??  Pancytopenia  Chronic inflammatory  and iron deficiency. Noted to have slow down trending blood cell counts. On 10/27 Plt-88, Hgb-7.1, WBC-2.6. Pancytopenia slightly improved on 10/31. Blood smear demonstrating tear drop cells and elevated PSA may be suggestive of progressing prostate cancer.   - Oncology consult  - Ferrous sulfate 350 mg every other day  ??  CKD  Baseline Cr ~1.0-1.2.  -BMP MTh  ??  DVT  Left peroneal and politeal veins dx 04/26/21. IVC filter placed 10/5 by VIR, needs outpt removal in 3-6 months  - Eliquis 5mg  po bid    DISPO: Patient to be discussed at weekly interdisciplinary team conference.   - EDD: 11/1  - Follow-up: PM&R, PCP, Onc, Palliative    SUBJECTIVE:     Interval Events: NAEO. Patient states he is doing okay this morning. States that esophagitis pain continues to come and go but he finds no benefit from GI cocktail, will discontinue it. Additionally noted today to have increased delay in response time which has been fluctuating. Will discuss with palliative for final recommendations for discharge tomorrow and any thoughts on cognitive delays.     OBJECTIVE:     Vital signs (last 24 hours):  Temp:  [36.8 ??C (98.2 ??F)-36.9 ??C (98.4 ??F)] 36.9 ??C (98.4 ??F)  Heart Rate:  [77-78] 78  Resp:  [18] 18  BP: (107-111)/(62-68) 111/68  MAP (mmHg):  [76-82] 82  SpO2:  [97 %-100 %] 97 %    Intake/Output (last 3 shifts):  No intake/output data recorded.    Physical Exam:   GEN: NAD  EYES: sclera anicteric, conjunctiva clear   HEENT: NCAT  NECK: trachea midline  CV: warm extremities, well perfused, no cyanosis,   RESP:  NWOB on RA  ABD: Not distended  SKIN: no visible masses, lesions, rashes, ecchymoses.   MSK: no notable contractures or ext swelling  NEURO:Alert, MAE, speech fluid and coherent, responds appropriately to questions but responding more slowly  PSYCH: mood euthymic, affect appropriate, thought process logical    Medications:  Scheduled   ??? abiraterone (ZYTIGA) tablet 1,000 mg **Patient Supplied** Daily   ??? lidocaine (XYLOCAINE) 2% viscous mucosal solution BID    And   ??? sucralfate (CARAFATE) oral suspension BID    And   ??? aluminum-magnesium hydroxide-simethicone (MAALOX MAX) 80-80-8 mg/mL oral suspension BID   ??? amLODIPine (NORVASC) tablet 5 mg Daily   ??? apixaban (ELIQUIS) tablet 5 mg BID   ??? atorvastatin (LIPITOR) tablet 40 mg Daily   ??? DULoxetine (CYMBALTA) DR capsule 60 mg daily   ??? ferrous sulfate tablet 325 mg Every other day   ??? leuprolide (LUPRON) injection 7.5 mg Q28 Days   ??? lidocaine (LIDODERM) 5 % patch 3 patch Daily   ??? MORPhine (MS CONTIN) 12 hr tablet 15 mg Q8H SCH   ??? predniSONE (DELTASONE) tablet 5 mg Daily     PRN acetaminophen, 650 mg, Q6H PRN  albuterol, 2 puff, Q4H PRN  lidocaine 2% viscous, 10 mL, Q4H PRN   And  sucralfate, 1 g, Q4H PRN   And  aluminum-magnesium hydroxide-simethicone, 30 mL, Q4H PRN  bisacodyL, 10 mg, Daily PRN  calcium carbonate, 200 mg of elem calcium, TID PRN  guaiFENesin, 100 mg, Q4H PRN  melatonin, 3 mg, Nightly PRN  ondansetron, 4 mg, Q6H PRN  oxyCODONE, 5 mg, Q4H PRN   Or  oxyCODONE, 10 mg, Q4H PRN  polyethylene glycol, 17 g, Daily PRN  simethicone, 80 mg, TID PRN      Labs/Studies: Reviewed  Radiology Results: no new

## 2021-05-23 ENCOUNTER — Ambulatory Visit: Admit: 2021-05-23 | Payer: MEDICARE

## 2021-05-23 MED ORDER — NALOXONE 4 MG/ACTUATION NASAL SPRAY
3 refills | 0 days | Status: CP
Start: 2021-05-23 — End: ?

## 2021-05-23 MED ADMIN — abiraterone (ZYTIGA) tablet 1,000 mg **Patient Supplied**: 1000 mg | ORAL | @ 13:00:00 | Stop: 2021-05-23

## 2021-05-23 MED ADMIN — apixaban (ELIQUIS) tablet 5 mg: 5 mg | ORAL | @ 02:00:00

## 2021-05-23 MED ADMIN — MORPhine (MS CONTIN) 12 hr tablet 15 mg: 15 mg | ORAL | @ 10:00:00 | Stop: 2021-05-23

## 2021-05-23 MED ADMIN — atorvastatin (LIPITOR) tablet 40 mg: 40 mg | ORAL | @ 13:00:00 | Stop: 2021-05-23

## 2021-05-23 MED ADMIN — amLODIPine (NORVASC) tablet 5 mg: 5 mg | ORAL | @ 13:00:00 | Stop: 2021-05-23

## 2021-05-23 MED ADMIN — DULoxetine (CYMBALTA) DR capsule 60 mg: 60 mg | ORAL | @ 09:00:00 | Stop: 2021-05-23

## 2021-05-23 MED ADMIN — apixaban (ELIQUIS) tablet 5 mg: 5 mg | ORAL | @ 13:00:00 | Stop: 2021-05-23

## 2021-05-23 MED ADMIN — MORPhine (MS CONTIN) 12 hr tablet 15 mg: 15 mg | ORAL | @ 02:00:00 | Stop: 2021-05-30

## 2021-05-23 MED ADMIN — oxyCODONE (ROXICODONE) 5 mg/5 mL solution 10 mg: 10 mg | ORAL | @ 03:00:00 | Stop: 2021-05-29

## 2021-05-23 MED ADMIN — predniSONE (DELTASONE) tablet 5 mg: 5 mg | ORAL | @ 13:00:00 | Stop: 2021-05-23

## 2021-05-23 NOTE — Unmapped (Signed)
Patient A&O x 4, continent of bowel and bladder. Skin intact. Swallow with pain. Neck and shoulder pain manage with Q4 PRN oxycodone. Call bell and phone within reach.  Problem: Rehabilitation (IRF) Plan of Care  Goal: Plan of Care Review  05/22/2021 1708 by Lewis Moccasin, RN BSN  Outcome: Progressing  05/22/2021 1706 by Lewis Moccasin, RN BSN  Outcome: Progressing  Goal: Patient-Specific Goal (Individualized)  05/22/2021 1708 by Lewis Moccasin, RN BSN  Outcome: Progressing  05/22/2021 1706 by Lewis Moccasin, RN BSN  Outcome: Progressing  Goal: Absence of New-Onset Illness or Injury  05/22/2021 1708 by Lewis Moccasin, RN BSN  Outcome: Progressing  05/22/2021 1706 by Lewis Moccasin, RN BSN  Outcome: Progressing  Intervention: Prevent Fall and Fall Injury  Recent Flowsheet Documentation  Taken 05/22/2021 1600 by Lewis Moccasin, RN BSN  Safety Interventions: fall reduction program maintained  Taken 05/22/2021 1400 by Lewis Moccasin, RN BSN  Safety Interventions: fall reduction program maintained  Taken 05/22/2021 1200 by Lewis Moccasin, RN BSN  Safety Interventions: fall reduction program maintained  Taken 05/22/2021 1000 by Lewis Moccasin, RN BSN  Safety Interventions: fall reduction program maintained  Taken 05/22/2021 0800 by Lewis Moccasin, RN BSN  Safety Interventions:   fall reduction program maintained   low bed   nonskid shoes/slippers when out of bed  Intervention: Prevent VTE (Venous Thromboembolism)  Recent Flowsheet Documentation  Taken 05/22/2021 0900 by Lewis Moccasin, RN BSN  VTE Prevention/Management: anticoagulant therapy  Goal: Optimal Comfort and Wellbeing  05/22/2021 1708 by Lewis Moccasin, RN BSN  Outcome: Progressing  05/22/2021 1706 by Lewis Moccasin, RN BSN  Outcome: Progressing  Goal: Home and Community Transition Plan Established  05/22/2021 1708 by Lewis Moccasin, RN BSN  Outcome: Progressing  05/22/2021 1706 by Lewis Moccasin, RN BSN  Outcome: Progressing     Problem: Fall Injury Risk  Goal: Absence of Fall and Fall-Related Injury  05/22/2021 1708 by Lewis Moccasin, RN BSN  Outcome: Progressing  05/22/2021 1706 by Lewis Moccasin, RN BSN  Outcome: Progressing  Intervention: Promote Injury-Free Environment  Recent Flowsheet Documentation  Taken 05/22/2021 1600 by Lewis Moccasin, RN BSN  Safety Interventions: fall reduction program maintained  Taken 05/22/2021 1400 by Lewis Moccasin, RN BSN  Safety Interventions: fall reduction program maintained  Taken 05/22/2021 1200 by Lewis Moccasin, RN BSN  Safety Interventions: fall reduction program maintained  Taken 05/22/2021 1000 by Lewis Moccasin, RN BSN  Safety Interventions: fall reduction program maintained  Taken 05/22/2021 0800 by Lewis Moccasin, RN BSN  Safety Interventions:   fall reduction program maintained   low bed   nonskid shoes/slippers when out of bed     Problem: Self-Care Deficit  Goal: Improved Ability to Complete Activities of Daily Living  05/22/2021 1708 by Lewis Moccasin, RN BSN  Outcome: Progressing  05/22/2021 1706 by Lewis Moccasin, RN BSN  Outcome: Progressing     Problem: Skin Injury Risk Increased  Goal: Skin Health and Integrity  05/22/2021 1708 by Lewis Moccasin, RN BSN  Outcome: Progressing  05/22/2021 1706 by Lewis Moccasin, RN BSN  Outcome: Progressing     Problem: Hypertension Comorbidity  Goal: Blood Pressure in Desired Range  05/22/2021 1708 by Lewis Moccasin, RN BSN  Outcome: Progressing  05/22/2021 1706 by Lewis Moccasin, RN BSN  Outcome: Progressing

## 2021-05-23 NOTE — Unmapped (Signed)
Patient A&O x 4, continent of bowel and bladder. Skin intact. Swallow with pain. Neck and shoulder pain manage with Q4 PRN oxycodone. Call bell and phone within reach.  Problem: Rehabilitation (IRF) Plan of Care  Goal: Plan of Care Review  Outcome: Progressing  Goal: Patient-Specific Goal (Individualized)  Outcome: Progressing  Goal: Absence of New-Onset Illness or Injury  Outcome: Progressing  Intervention: Prevent Fall and Fall Injury  Recent Flowsheet Documentation  Taken 05/22/2021 1600 by Lewis Moccasin, RN BSN  Safety Interventions: fall reduction program maintained  Taken 05/22/2021 1400 by Lewis Moccasin, RN BSN  Safety Interventions: fall reduction program maintained  Taken 05/22/2021 1200 by Lewis Moccasin, RN BSN  Safety Interventions: fall reduction program maintained  Taken 05/22/2021 1000 by Lewis Moccasin, RN BSN  Safety Interventions: fall reduction program maintained  Taken 05/22/2021 0800 by Lewis Moccasin, RN BSN  Safety Interventions:   fall reduction program maintained   low bed   nonskid shoes/slippers when out of bed  Intervention: Prevent VTE (Venous Thromboembolism)  Recent Flowsheet Documentation  Taken 05/22/2021 0900 by Lewis Moccasin, RN BSN  VTE Prevention/Management: anticoagulant therapy  Goal: Optimal Comfort and Wellbeing  Outcome: Progressing  Goal: Home and Community Transition Plan Established  Outcome: Progressing     Problem: Fall Injury Risk  Goal: Absence of Fall and Fall-Related Injury  Outcome: Progressing  Intervention: Promote Injury-Free Environment  Recent Flowsheet Documentation  Taken 05/22/2021 1600 by Lewis Moccasin, RN BSN  Safety Interventions: fall reduction program maintained  Taken 05/22/2021 1400 by Lewis Moccasin, RN BSN  Safety Interventions: fall reduction program maintained  Taken 05/22/2021 1200 by Lewis Moccasin, RN BSN  Safety Interventions: fall reduction program maintained  Taken 05/22/2021 1000 by Lewis Moccasin, RN BSN  Safety Interventions: fall reduction program maintained  Taken 05/22/2021 0800 by Lewis Moccasin, RN BSN  Safety Interventions:   fall reduction program maintained   low bed   nonskid shoes/slippers when out of bed     Problem: Self-Care Deficit  Goal: Improved Ability to Complete Activities of Daily Living  Outcome: Progressing     Problem: Skin Injury Risk Increased  Goal: Skin Health and Integrity  Outcome: Progressing     Problem: Hypertension Comorbidity  Goal: Blood Pressure in Desired Range  Outcome: Progressing

## 2021-05-23 NOTE — Unmapped (Signed)
Patient is alert and oriented x 4. Regular diet. Takes medicine whole with applesauce, but swallowing is very still very painful for pt. Continent of bowel and bladder. Chemo precautions maintained.  PRN Oxycodone given for throat and mouth pain.  Skin is intact. Aspiration, skin and fall precautions maintained. Bed in lowest position, wheels locked. Call bell and phone within reach. Pain Meds given at bedtime, adequate. Pt states he is able to stand pivot for transfers. Pt coughing productively small amount.  No falls or injuries this shift. Hourly rounding ongoing. Will continue to monitor.  Bm 05/22/21.  Problem: Rehabilitation (IRF) Plan of Care  Goal: Plan of Care Review  Outcome: Progressing     Problem: Rehabilitation (IRF) Plan of Care  Goal: Patient-Specific Goal (Individualized)  Outcome: Progressing     Problem: Rehabilitation (IRF) Plan of Care  Goal: Absence of New-Onset Illness or Injury  Outcome: Progressing  Intervention: Prevent Fall and Fall Injury  Recent Flowsheet Documentation  Taken 05/23/2021 0200 by Wynelle Fanny Montgomery-Crabtree, RN  Safety Interventions:   fall reduction program maintained   nonskid shoes/slippers when out of bed  Taken 05/23/2021 0000 by Wynelle Fanny Montgomery-Crabtree, RN  Safety Interventions:   fall reduction program maintained   aspiration precautions   nonskid shoes/slippers when out of bed  Taken 05/22/2021 2200 by Wynelle Fanny Montgomery-Crabtree, RN  Safety Interventions:   aspiration precautions   fall reduction program maintained   nonskid shoes/slippers when out of bed  Taken 05/22/2021 2000 by Jeralyn Bennett, RN  Safety Interventions:   fall reduction program maintained   nonskid shoes/slippers when out of bed   aspiration precautions  Taken 05/22/2021 1930 by Wynelle Fanny Montgomery-Crabtree, RN  Safety Interventions:   fall reduction program maintained   infection management   nonskid shoes/slippers when out of bed   aspiration precautions  Intervention: Prevent Infection  Recent Flowsheet Documentation  Taken 05/23/2021 0200 by Wynelle Fanny Montgomery-Crabtree, RN  Infection Prevention: hand hygiene promoted  Taken 05/22/2021 2200 by Jeralyn Bennett, RN  Infection Prevention: hand hygiene promoted  Taken 05/22/2021 2000 by Jeralyn Bennett, RN  Infection Prevention: hand hygiene promoted  Taken 05/22/2021 1930 by Wynelle Fanny Montgomery-Crabtree, RN  Infection Prevention: hand hygiene promoted  Intervention: Prevent VTE (Venous Thromboembolism)  Recent Flowsheet Documentation  Taken 05/22/2021 2300 by Jeralyn Bennett, RN  VTE Prevention/Management: anticoagulant therapy     Problem: Fall Injury Risk  Goal: Absence of Fall and Fall-Related Injury  Outcome: Progressing  Intervention: Promote Injury-Free Environment  Recent Flowsheet Documentation  Taken 05/23/2021 0200 by Wynelle Fanny Montgomery-Crabtree, RN  Safety Interventions:   fall reduction program maintained   nonskid shoes/slippers when out of bed  Taken 05/23/2021 0000 by Jeralyn Bennett, RN  Safety Interventions:   fall reduction program maintained   aspiration precautions   nonskid shoes/slippers when out of bed  Taken 05/22/2021 2200 by Wynelle Fanny Montgomery-Crabtree, RN  Safety Interventions:   aspiration precautions   fall reduction program maintained   nonskid shoes/slippers when out of bed  Taken 05/22/2021 2000 by Jeralyn Bennett, RN  Safety Interventions:   fall reduction program maintained   nonskid shoes/slippers when out of bed   aspiration precautions  Taken 05/22/2021 1930 by Wynelle Fanny Montgomery-Crabtree, RN  Safety Interventions:   fall reduction program maintained   infection management   nonskid shoes/slippers when out of bed   aspiration precautions     Problem: Self-Care Deficit  Goal: Improved Ability to Complete Activities  of Daily Living  Outcome: Progressing     Problem: Skin Injury Risk Increased  Goal: Skin Health and Integrity  Outcome: Resolved  Intervention: Optimize Skin Protection  Recent Flowsheet Documentation  Taken 05/23/2021 0200 by Wynelle Fanny Montgomery-Crabtree, RN  Pressure Reduction Techniques: frequent weight shift encouraged  Head of Bed [facility]) Positioning: HOB at 30-45 degrees  Taken 05/23/2021 0000 by Wynelle Fanny Montgomery-Crabtree, RN  Pressure Reduction Techniques: frequent weight shift encouraged  Head of Bed (HOB) Positioning: HOB at 30-45 degrees  Pressure Reduction Devices: positioning supports utilized  Skin Protection: incontinence pads utilized  Taken 05/22/2021 2300 by Wynelle Fanny Montgomery-Crabtree, RN  Pressure Reduction Devices: positioning supports utilized  Taken 05/22/2021 2200 by Wynelle Fanny Montgomery-Crabtree, RN  Pressure Reduction Techniques: frequent weight shift encouraged  Head of Bed (HOB) Positioning: HOB at 30-45 degrees  Pressure Reduction Devices: positioning supports utilized  Skin Protection: incontinence pads utilized  Taken 05/22/2021 2000 by Wynelle Fanny Montgomery-Crabtree, RN  Pressure Reduction Techniques: frequent weight shift encouraged  Pressure Reduction Devices: chair cushion utilized  Taken 05/22/2021 1930 by Wynelle Fanny Montgomery-Crabtree, RN  Pressure Reduction Techniques: frequent weight shift encouraged  Pressure Reduction Devices: chair cushion utilized

## 2021-05-25 ENCOUNTER — Other Ambulatory Visit: Payer: Medicaid Other | Admitting: Hospice

## 2021-05-25 ENCOUNTER — Other Ambulatory Visit: Payer: Self-pay

## 2021-05-25 DIAGNOSIS — G8929 Other chronic pain: Secondary | ICD-10-CM

## 2021-05-25 DIAGNOSIS — Z515 Encounter for palliative care: Secondary | ICD-10-CM

## 2021-05-25 DIAGNOSIS — C61 Malignant neoplasm of prostate: Secondary | ICD-10-CM

## 2021-05-25 DIAGNOSIS — R5381 Other malaise: Secondary | ICD-10-CM

## 2021-05-25 DIAGNOSIS — K5901 Slow transit constipation: Secondary | ICD-10-CM

## 2021-05-25 NOTE — Unmapped (Signed)
Hi,     Santina Evans contacted the [employer] regarding the following:    - States that she is calling to see if she can get Stephenson's appointment for 05/26/21 changed to video visit or a telephone call due to not being able to transport her dad    Please contact Santina Evans at [phone].    Thanks in advance,    Rosary Lively  [facility]   [phone]

## 2021-05-25 NOTE — Progress Notes (Signed)
[facility] Palliative Care Consult Note Telephone: [phone]  Fax: [phone]  PATIENT NAME: Terry Mitchell DOB: 1951-01-13 MRN: [id number]  PRIMARY CARE PROVIDER:   [name], MD Terry Mitchell, [address],  [place] [address]  REFERRING PROVIDER: [name], MD Terry Mitchell, [place],  [place] [address]  RESPONSIBLE PARTY:  Self [place]     Name Relation Home Work Mobile   Mitchell,Terry Daughter   [phone]      TELEHEALTH VISIT STATEMENT Due to the COVID-19 crisis, this visit was done via telemedicine and it was initiated and consent by this patient and or family. Video-audio (telehealth) contact was unable to be done due to technical barriers from the patient's side.   Visit is to build trust and highlight Palliative Medicine as specialized medical care for people living with serious illness, aimed at facilitating better quality of life through symptoms relief, assisting with advance care planning and complex medical decision making. NP also called [name] and she provided additional update on patient. This is a follow up visit.  RECOMMENDATIONS/PLAN:   Advance Care Planning/Code Status:Patient is a Full code. Daughter would further discuss with patient on the his code status and whether to change it.   Goals of Care: Goals of care include to maximize quality of life and symptom management.  Visit consisted of counseling and education dealing with the complex and emotionally intense issues of symptom management and palliative care in the setting of serious and potentially life-threatening illness. Palliative care team will continue to support patient, patient's family, and medical team.  Symptom management/Plan:  Metastatic prostate cancer: s/p chemo/radiation. Metastasis to the spine. Follow up with Oncologist as planned.  Debility: From cancer and the  treatments. Patient with increasing weakness, not able to walk at this time. Daughter reports recent radiation 'burnt his esophagus'; patient having difficulty swallowing food. Soft foods and smoothies are encouraged. Daughter reports  lidocaine oropharygeal solution is helpful.  Left shoulder pain: Managed with Ocycodone/acetaminophen, Morphine.  Constipation: Continue MiraLAX Every Other Day and increase to daily if needed for constipation.  Follow up: Palliative care will continue to follow for complex medical decision making, advance care planning, and clarification of goals. Return 6 weeks or prn. Encouraged to call provider sooner with any concerns.  CHIEF COMPLAINT: Palliative follow up  HISTORY OF PRESENT ILLNESS:  Terry Mitchell a 70 y.o. male with multiple medical problems including metastatic prostate CA, debility. History of paraplegia, HTN, left shoulder pain, constipation. Patient denied pain/discomfort,- already took his pain medication, endorsed weakness, denies respiratory distress. History obtained from review of EMR, discussion with primary team, family and/or patient. Records reviewed and summarized above. All 10 point systems reviewed and are negative except as documented in history of present illness above.  Review and summarization of Epic records shows history from other than patient.   Palliative Care was asked to follow this patient o help address complex decision making in the context of advance care planning and goals of care clarification.    PERTINENT MEDICATIONS:  Outpatient Encounter Medications as of 05/25/2021  Medication Sig   acetaminophen (TYLENOL) 325 MG tablet Take 1-2 tablets (325-650 mg total) by mouth every 4 (four) hours as needed for mild pain.   albuterol (VENTOLIN HFA) 108 (90 Base) MCG/ACT inhaler Inhale 2 puffs into the lungs every 6 (six) hours as needed for wheezing or shortness of breath. (  Patient not taking: Reported on 01/24/2021)   amLODipine  (NORVASC) 5 MG tablet Take 1.5 tablets (7.5 mg total) by mouth daily.   benzonatate (TESSALON) 100 MG capsule Take 1 capsule (100 mg total) by mouth 3 (three) times daily as needed for cough.   dexamethasone (DECADRON) 2 MG tablet Take 1 tablet (2 mg total) by mouth daily.   enoxaparin (LOVENOX) 40 MG/0.4ML injection Inject 0.4 mLs (40 mg total) into the skin daily.   gabapentin (NEURONTIN) 300 MG capsule Take 300 mg by mouth 2 (two) times daily.   HYDROmorphone (DILAUDID) 2 MG tablet Take 1 tablet (2 mg total) by mouth every 6 (six) hours as needed for up to 12 doses for severe pain.   ondansetron (ZOFRAN) 4 MG tablet Take 2 tablets (8 mg total) by mouth every 8 (eight) hours as needed for nausea or vomiting.   oxyCODONE-acetaminophen (PERCOCET) 5-325 MG tablet Take 1-2 tablets by mouth every 6 (six) hours as needed for severe pain.   polyethylene glycol (MIRALAX / GLYCOLAX) 17 g packet Take 17 g by mouth 2 (two) times daily.   vitamin B-12 (CYANOCOBALAMIN) 1000 MCG tablet Take 1 tablet (1,000 mcg total) by mouth daily.   [DISCONTINUED] prochlorperazine (COMPAZINE) 10 MG tablet Take 1 tablet (10 mg total) by mouth every 6 (six) hours as needed (Nausea or vomiting). (Patient not taking: Reported on 11/02/2019)   Facility-Administered Encounter Medications as of 05/25/2021  Medication   sodium chloride flush (NS) 0.9 % injection 10 mL    HOSPICE ELIGIBILITY/DIAGNOSIS: TBD  PAST MEDICAL HISTORY:  Past Medical History:  Diagnosis Date   Depression    recently went on disability d/t diagnosis   History of recent fall 01/2018   missed the last step on ladder, causing back discomfort   Hypertension    Prostate cancer ([facility]) 12/2017   prostate      ALLERGIES:  Allergies  Allergen Reactions   Penicillins Hives and Other (See Comments)    Hives, welts, swelling profusely . Pretty severe reaction Has patient had a PCN reaction causing immediate rash, facial/tongue/throat swelling, SOB or  lightheadedness with hypotension: No Has patient had a PCN reaction causing severe rash involving mucus membranes or skin necrosis: Yes Has patient had a PCN reaction that required hospitalization: Yes Has patient had a PCN reaction occurring within the last 10 years: No If all of the above answers are "NO", then may proceed with Cephalosporin use.    Lactose Intolerance (Gi) Other (See Comments)    Gi upset      I spent  30 minutes providing this consultation; this includes time spent with patient/family, chart review and documentation. More than 50% of the time in this consultation was spent on counseling and coordinating communication   Thank you for the opportunity to participate in the care of [place] Please call our office at [phone] if we can be of additional assistance.  Note: Portions of this note were generated with [employer]. Dictation errors may occur despite best attempts at proofreading.  Teodoro Spray, NP

## 2021-05-26 ENCOUNTER — Emergency Department (HOSPITAL_COMMUNITY): Payer: Medicare HMO

## 2021-05-26 ENCOUNTER — Inpatient Hospital Stay (HOSPITAL_COMMUNITY): Payer: Medicare HMO

## 2021-05-26 ENCOUNTER — Inpatient Hospital Stay (HOSPITAL_COMMUNITY)
Admission: EM | Admit: 2021-05-26 | Discharge: 2021-06-09 | DRG: 193 | Disposition: A | Discharge status: Skilled Nursing Facility | Payer: Medicare HMO | Attending: Family Medicine | Admitting: Family Medicine

## 2021-05-26 ENCOUNTER — Other Ambulatory Visit: Payer: Self-pay

## 2021-05-26 ENCOUNTER — Encounter: Payer: Self-pay | Admitting: Oncology

## 2021-05-26 ENCOUNTER — Encounter (HOSPITAL_COMMUNITY): Payer: Self-pay

## 2021-05-26 DIAGNOSIS — R7989 Other specified abnormal findings of blood chemistry: Secondary | ICD-10-CM | POA: Diagnosis present

## 2021-05-26 DIAGNOSIS — E860 Dehydration: Secondary | ICD-10-CM | POA: Diagnosis present

## 2021-05-26 DIAGNOSIS — K208 Other esophagitis without bleeding: Secondary | ICD-10-CM | POA: Diagnosis present

## 2021-05-26 DIAGNOSIS — Z8546 Personal history of malignant neoplasm of prostate: Secondary | ICD-10-CM

## 2021-05-26 DIAGNOSIS — Z20822 Contact with and (suspected) exposure to covid-19: Secondary | ICD-10-CM | POA: Diagnosis present

## 2021-05-26 DIAGNOSIS — Y842 Radiological procedure and radiotherapy as the cause of abnormal reaction of the patient, or of later complication, without mention of misadventure at the time of the procedure: Secondary | ICD-10-CM | POA: Diagnosis present

## 2021-05-26 DIAGNOSIS — R109 Unspecified abdominal pain: Secondary | ICD-10-CM

## 2021-05-26 DIAGNOSIS — G9529 Other cord compression: Secondary | ICD-10-CM | POA: Diagnosis present

## 2021-05-26 DIAGNOSIS — Z87891 Personal history of nicotine dependence: Secondary | ICD-10-CM

## 2021-05-26 DIAGNOSIS — Z6826 Body mass index (BMI) 26.0-26.9, adult: Secondary | ICD-10-CM | POA: Diagnosis not present

## 2021-05-26 DIAGNOSIS — Z515 Encounter for palliative care: Secondary | ICD-10-CM | POA: Diagnosis not present

## 2021-05-26 DIAGNOSIS — J189 Pneumonia, unspecified organism: Secondary | ICD-10-CM | POA: Diagnosis present

## 2021-05-26 DIAGNOSIS — E876 Hypokalemia: Secondary | ICD-10-CM | POA: Diagnosis present

## 2021-05-26 DIAGNOSIS — Z79899 Other long term (current) drug therapy: Secondary | ICD-10-CM | POA: Diagnosis not present

## 2021-05-26 DIAGNOSIS — Z7901 Long term (current) use of anticoagulants: Secondary | ICD-10-CM

## 2021-05-26 DIAGNOSIS — Z923 Personal history of irradiation: Secondary | ICD-10-CM

## 2021-05-26 DIAGNOSIS — Z833 Family history of diabetes mellitus: Secondary | ICD-10-CM | POA: Diagnosis not present

## 2021-05-26 DIAGNOSIS — T451X5A Adverse effect of antineoplastic and immunosuppressive drugs, initial encounter: Secondary | ICD-10-CM | POA: Diagnosis present

## 2021-05-26 DIAGNOSIS — D649 Anemia, unspecified: Secondary | ICD-10-CM

## 2021-05-26 DIAGNOSIS — Z823 Family history of stroke: Secondary | ICD-10-CM

## 2021-05-26 DIAGNOSIS — R531 Weakness: Secondary | ICD-10-CM | POA: Diagnosis not present

## 2021-05-26 DIAGNOSIS — N179 Acute kidney failure, unspecified: Secondary | ICD-10-CM | POA: Diagnosis present

## 2021-05-26 DIAGNOSIS — D696 Thrombocytopenia, unspecified: Secondary | ICD-10-CM | POA: Diagnosis present

## 2021-05-26 DIAGNOSIS — D61810 Antineoplastic chemotherapy induced pancytopenia: Secondary | ICD-10-CM | POA: Diagnosis present

## 2021-05-26 DIAGNOSIS — R1310 Dysphagia, unspecified: Secondary | ICD-10-CM | POA: Diagnosis present

## 2021-05-26 DIAGNOSIS — R609 Edema, unspecified: Secondary | ICD-10-CM

## 2021-05-26 DIAGNOSIS — I10 Essential (primary) hypertension: Secondary | ICD-10-CM | POA: Diagnosis present

## 2021-05-26 DIAGNOSIS — C61 Malignant neoplasm of prostate: Secondary | ICD-10-CM | POA: Diagnosis present

## 2021-05-26 DIAGNOSIS — K5641 Fecal impaction: Secondary | ICD-10-CM | POA: Diagnosis present

## 2021-05-26 DIAGNOSIS — G8220 Paraplegia, unspecified: Secondary | ICD-10-CM | POA: Diagnosis present

## 2021-05-26 DIAGNOSIS — Z66 Do not resuscitate: Secondary | ICD-10-CM | POA: Diagnosis present

## 2021-05-26 DIAGNOSIS — M4802 Spinal stenosis, cervical region: Secondary | ICD-10-CM | POA: Diagnosis present

## 2021-05-26 DIAGNOSIS — G992 Myelopathy in diseases classified elsewhere: Secondary | ICD-10-CM | POA: Diagnosis present

## 2021-05-26 DIAGNOSIS — I82409 Acute embolism and thrombosis of unspecified deep veins of unspecified lower extremity: Secondary | ICD-10-CM

## 2021-05-26 DIAGNOSIS — R748 Abnormal levels of other serum enzymes: Secondary | ICD-10-CM

## 2021-05-26 DIAGNOSIS — C7951 Secondary malignant neoplasm of bone: Secondary | ICD-10-CM | POA: Diagnosis present

## 2021-05-26 DIAGNOSIS — R140 Abdominal distension (gaseous): Secondary | ICD-10-CM

## 2021-05-26 DIAGNOSIS — Z86718 Personal history of other venous thrombosis and embolism: Secondary | ICD-10-CM

## 2021-05-26 DIAGNOSIS — R630 Anorexia: Secondary | ICD-10-CM

## 2021-05-26 DIAGNOSIS — D61818 Other pancytopenia: Secondary | ICD-10-CM

## 2021-05-26 DIAGNOSIS — Z7189 Other specified counseling: Secondary | ICD-10-CM | POA: Diagnosis not present

## 2021-05-26 DIAGNOSIS — Z881 Allergy status to other antibiotic agents status: Secondary | ICD-10-CM

## 2021-05-26 DIAGNOSIS — R64 Cachexia: Secondary | ICD-10-CM | POA: Diagnosis present

## 2021-05-26 LAB — PREPARE RBC (CROSSMATCH)

## 2021-05-26 LAB — CBC WITH DIFFERENTIAL/PLATELET
Abs Immature Granulocytes: 0.03 10*3/uL (ref 0.00–0.07)
Basophils Absolute: 0 10*3/uL (ref 0.0–0.1)
Basophils Relative: 0 %
Eosinophils Absolute: 0 10*3/uL (ref 0.0–0.5)
Eosinophils Relative: 0 %
HCT: 25 % — ABNORMAL LOW (ref 39.0–52.0)
Hemoglobin: 7.5 g/dL — ABNORMAL LOW (ref 13.0–17.0)
Immature Granulocytes: 1 %
Lymphocytes Relative: 8 %
Lymphs Abs: 0.3 10*3/uL — ABNORMAL LOW (ref 0.7–4.0)
MCH: 30.6 pg (ref 26.0–34.0)
MCHC: 30 g/dL (ref 30.0–36.0)
MCV: 102 fL — ABNORMAL HIGH (ref 80.0–100.0)
Monocytes Absolute: 0.4 10*3/uL (ref 0.1–1.0)
Monocytes Relative: 12 %
Neutro Abs: 2.6 10*3/uL (ref 1.7–7.7)
Neutrophils Relative %: 79 %
Platelets: 127 10*3/uL — ABNORMAL LOW (ref 150–400)
RBC: 2.45 MIL/uL — ABNORMAL LOW (ref 4.22–5.81)
RDW: 19.2 % — ABNORMAL HIGH (ref 11.5–15.5)
WBC: 3.4 10*3/uL — ABNORMAL LOW (ref 4.0–10.5)
nRBC: 0.9 % — ABNORMAL HIGH (ref 0.0–0.2)

## 2021-05-26 LAB — COMPREHENSIVE METABOLIC PANEL
ALT: 12 U/L (ref 0–44)
AST: 40 U/L (ref 15–41)
Albumin: 3.3 g/dL — ABNORMAL LOW (ref 3.5–5.0)
Alkaline Phosphatase: 697 U/L — ABNORMAL HIGH (ref 38–126)
Anion gap: 11 (ref 5–15)
BUN: 73 mg/dL — ABNORMAL HIGH (ref 8–23)
CO2: 28 mmol/L (ref 22–32)
Calcium: 11.5 mg/dL — ABNORMAL HIGH (ref 8.9–10.3)
Chloride: 105 mmol/L (ref 98–111)
Creatinine, Ser: 3.27 mg/dL — ABNORMAL HIGH (ref 0.61–1.24)
GFR, Estimated: 20 mL/min — ABNORMAL LOW (ref >60)
Glucose, Bld: 103 mg/dL — ABNORMAL HIGH (ref 70–99)
Potassium: 4.2 mmol/L (ref 3.5–5.1)
Sodium: 144 mmol/L (ref 135–145)
Total Bilirubin: 1.2 mg/dL (ref 0.3–1.2)
Total Protein: 6.3 g/dL — ABNORMAL LOW (ref 6.5–8.1)

## 2021-05-26 LAB — CREATININE, SERUM
Creatinine, Ser: 2.84 mg/dL — ABNORMAL HIGH (ref 0.61–1.24)
GFR, Estimated: 23 mL/min — ABNORMAL LOW (ref >60)

## 2021-05-26 LAB — CBC
HCT: 21.4 % — ABNORMAL LOW (ref 39.0–52.0)
Hemoglobin: 6.4 g/dL — CL (ref 13.0–17.0)
MCH: 30.6 pg (ref 26.0–34.0)
MCHC: 29.9 g/dL — ABNORMAL LOW (ref 30.0–36.0)
MCV: 102.4 fL — ABNORMAL HIGH (ref 80.0–100.0)
Platelets: 101 10*3/uL — ABNORMAL LOW (ref 150–400)
RBC: 2.09 MIL/uL — ABNORMAL LOW (ref 4.22–5.81)
RDW: 19.1 % — ABNORMAL HIGH (ref 11.5–15.5)
WBC: 2.9 10*3/uL — ABNORMAL LOW (ref 4.0–10.5)
nRBC: 1 % — ABNORMAL HIGH (ref 0.0–0.2)

## 2021-05-26 LAB — URINALYSIS, ROUTINE W REFLEX MICROSCOPIC
Bacteria, UA: NONE SEEN
Bilirubin Urine: NEGATIVE
Glucose, UA: NEGATIVE mg/dL
Ketones, ur: 5 mg/dL — AB
Leukocytes,Ua: NEGATIVE
Nitrite: NEGATIVE
Protein, ur: NEGATIVE mg/dL
Specific Gravity, Urine: 1.018 (ref 1.005–1.030)
pH: 5 (ref 5.0–8.0)

## 2021-05-26 LAB — HEMOGLOBIN AND HEMATOCRIT, BLOOD
HCT: 23.3 % — ABNORMAL LOW (ref 39.0–52.0)
Hemoglobin: 6.9 g/dL — CL (ref 13.0–17.0)

## 2021-05-26 LAB — RESP PANEL BY RT-PCR (FLU A&B, COVID) ARPGX2
Influenza A by PCR: NEGATIVE
Influenza B by PCR: NEGATIVE
SARS Coronavirus 2 by RT PCR: NEGATIVE

## 2021-05-26 LAB — LACTIC ACID, PLASMA: Lactic Acid, Venous: 1.2 mmol/L (ref 0.5–1.9)

## 2021-05-26 LAB — ABO/RH: ABO/RH(D): O POS

## 2021-05-26 LAB — HIV ANTIBODY (ROUTINE TESTING W REFLEX): HIV Screen 4th Generation wRfx: NONREACTIVE

## 2021-05-26 IMAGING — US US ABDOMEN LIMITED
1 series · 15 of 25 positions shown · non-contrast
Comparison: CT from [DATE]

CLINICAL DATA: Elevated creatinine

EXAM:
ULTRASOUND ABDOMEN LIMITED RIGHT UPPER QUADRANT

[Series 1: us abdomen limited ruq mc & wl · 15 of 36 slices shown]
[im 1/36]
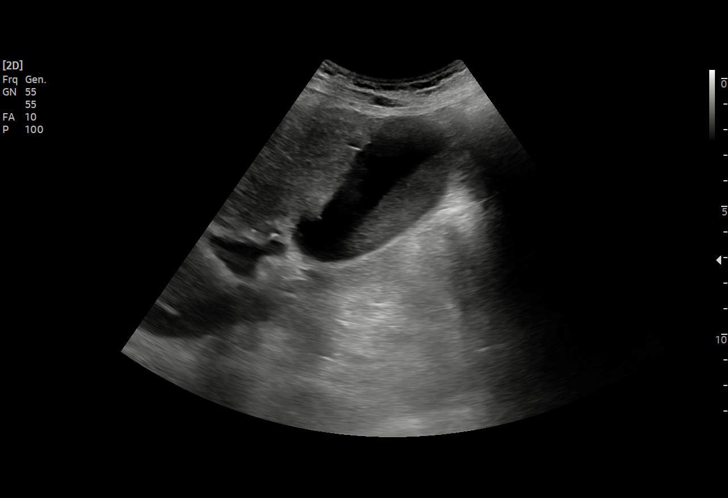
[im 3/36]
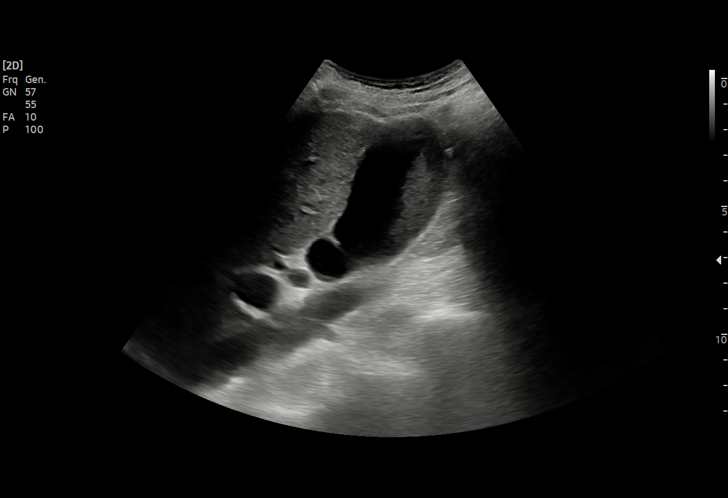
[im 6/36]
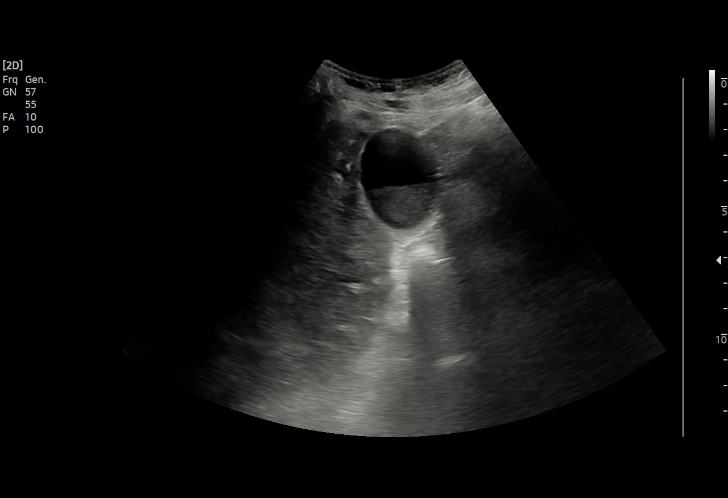
[im 8/36]
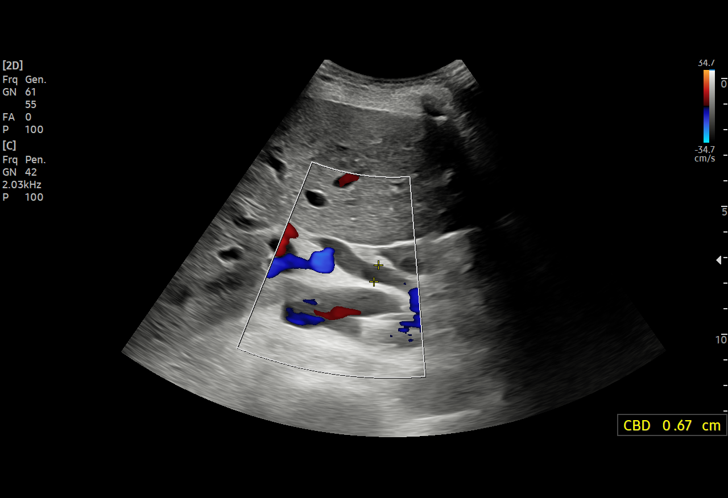
[im 11/36]
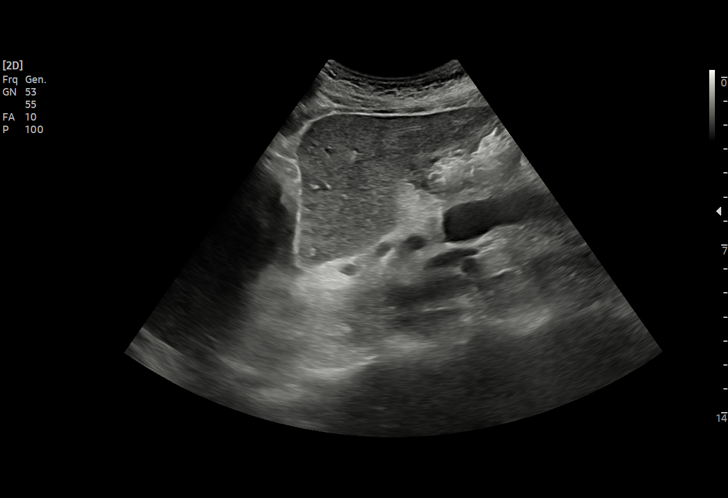
[im 14/36]
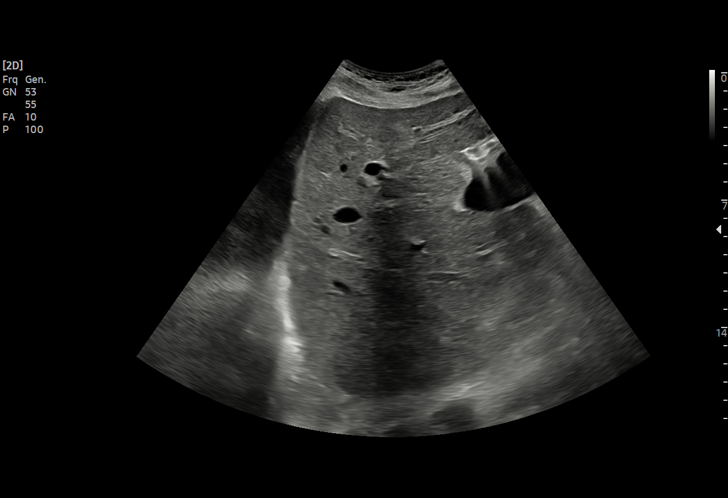
[im 15/36]
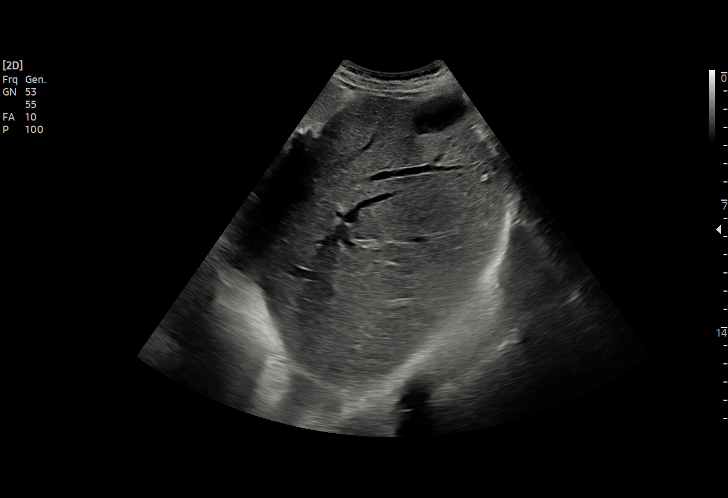
[im 18/36]
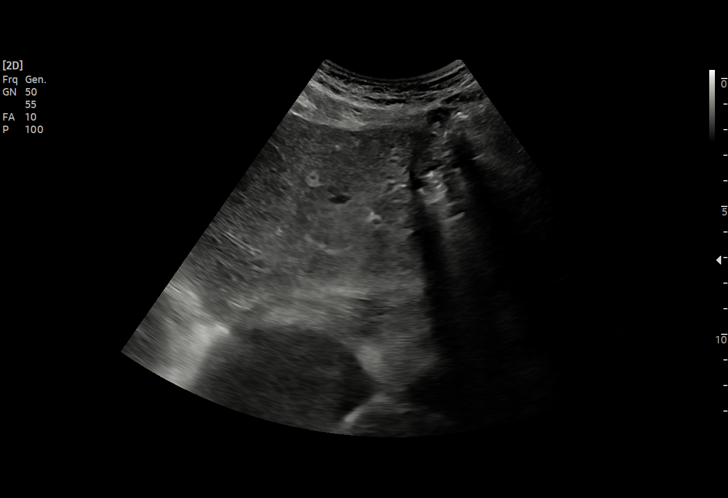
[im 21/36]
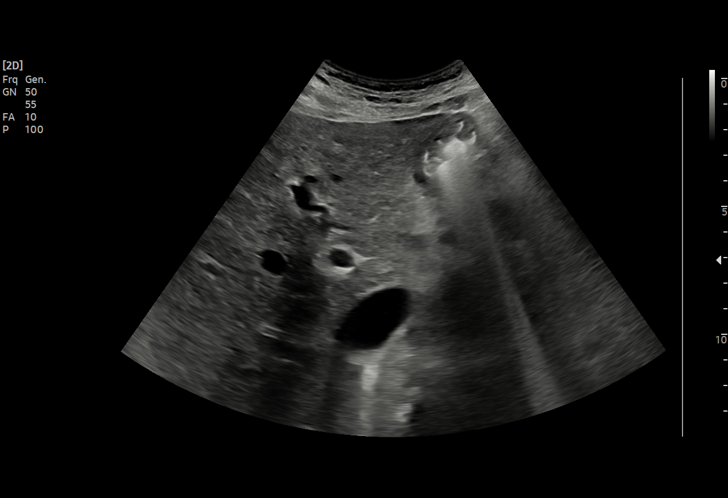
[im 22/36]
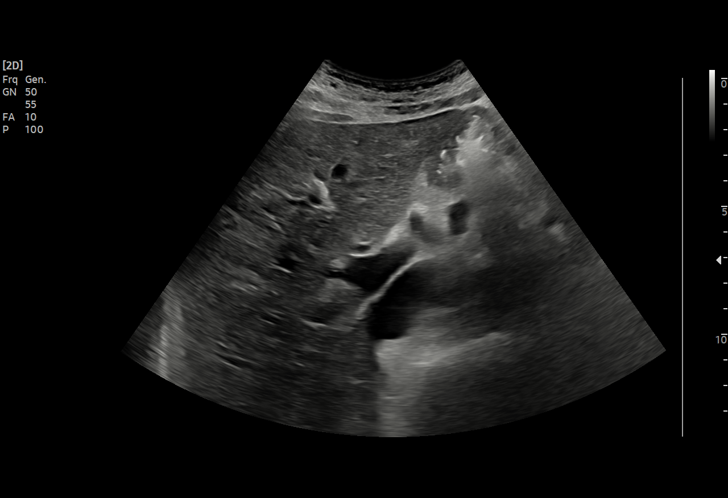
[im 25/36]
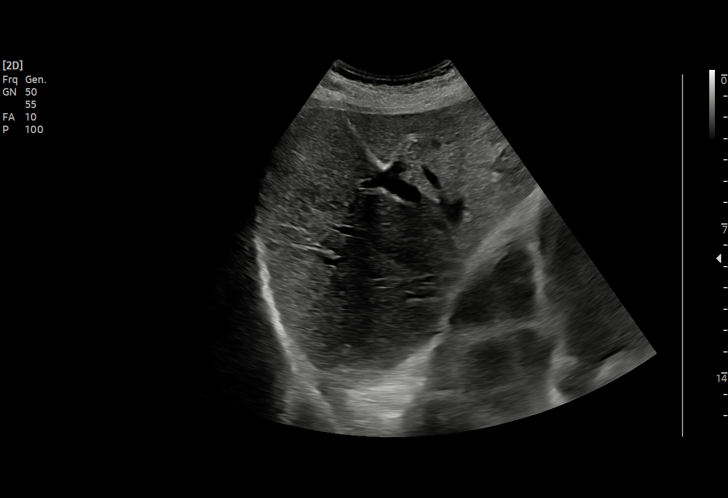
[im 28/36]
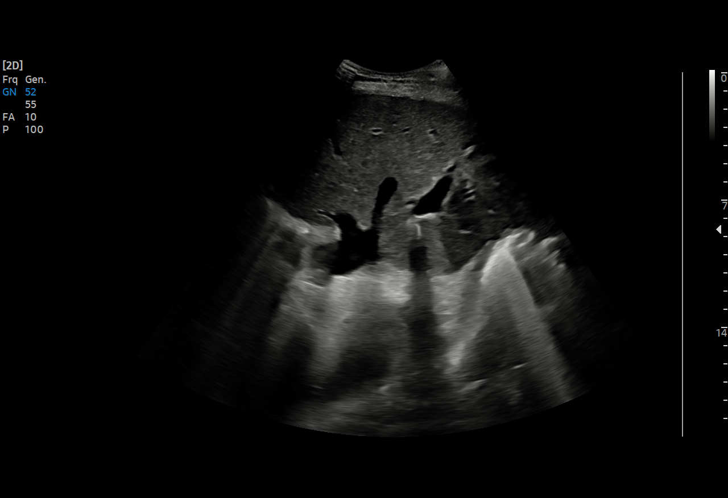
[im 30/36]
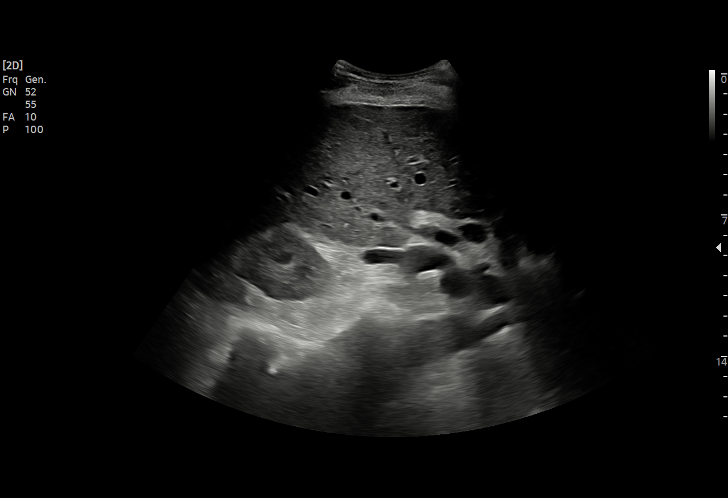
[im 33/36]
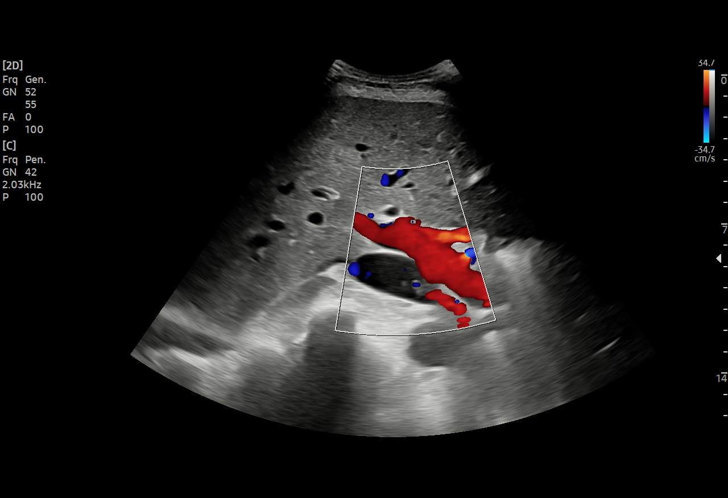
[im 36/36]
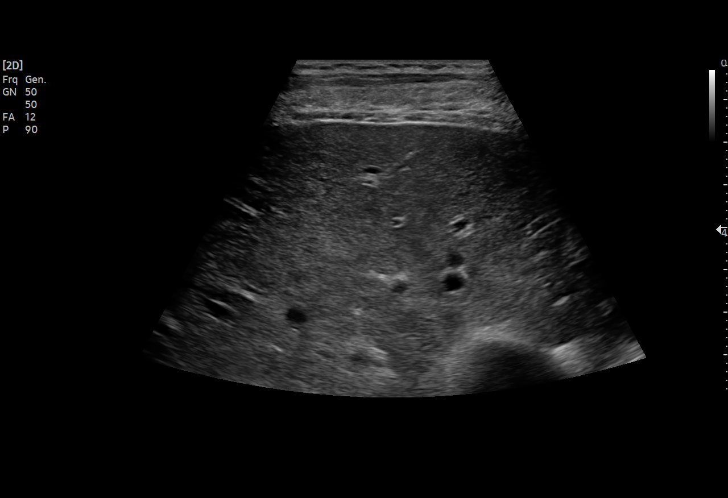

[15 of 25 positions shown; findings below may reference images not displayed]

FINDINGS: Gallbladder:

Gallbladder is well distended with gallbladder sludge. No stones are
identified. No wall thickening is seen.

Common bile duct:

Diameter: 6.7 mm which is within normal limits for the patient's
given age.

Liver:

No focal lesion identified. Within normal limits in parenchymal
echogenicity. Portal vein is patent on color Doppler imaging with
normal direction of blood flow towards the liver.

Other: None.
IMPRESSION: Gallbladder sludge without acute complicating factors. No other
focal abnormality is noted.

## 2021-05-26 IMAGING — DX DG CHEST 1V PORT
1 series · 1 of 1 positions shown · non-contrast
Comparison: Weakness, increased weakness.  Diminished appetite.

CLINICAL DATA: History of prostate cancer.

EXAM:
PORTABLE CHEST 1 VIEW

[chest ap]
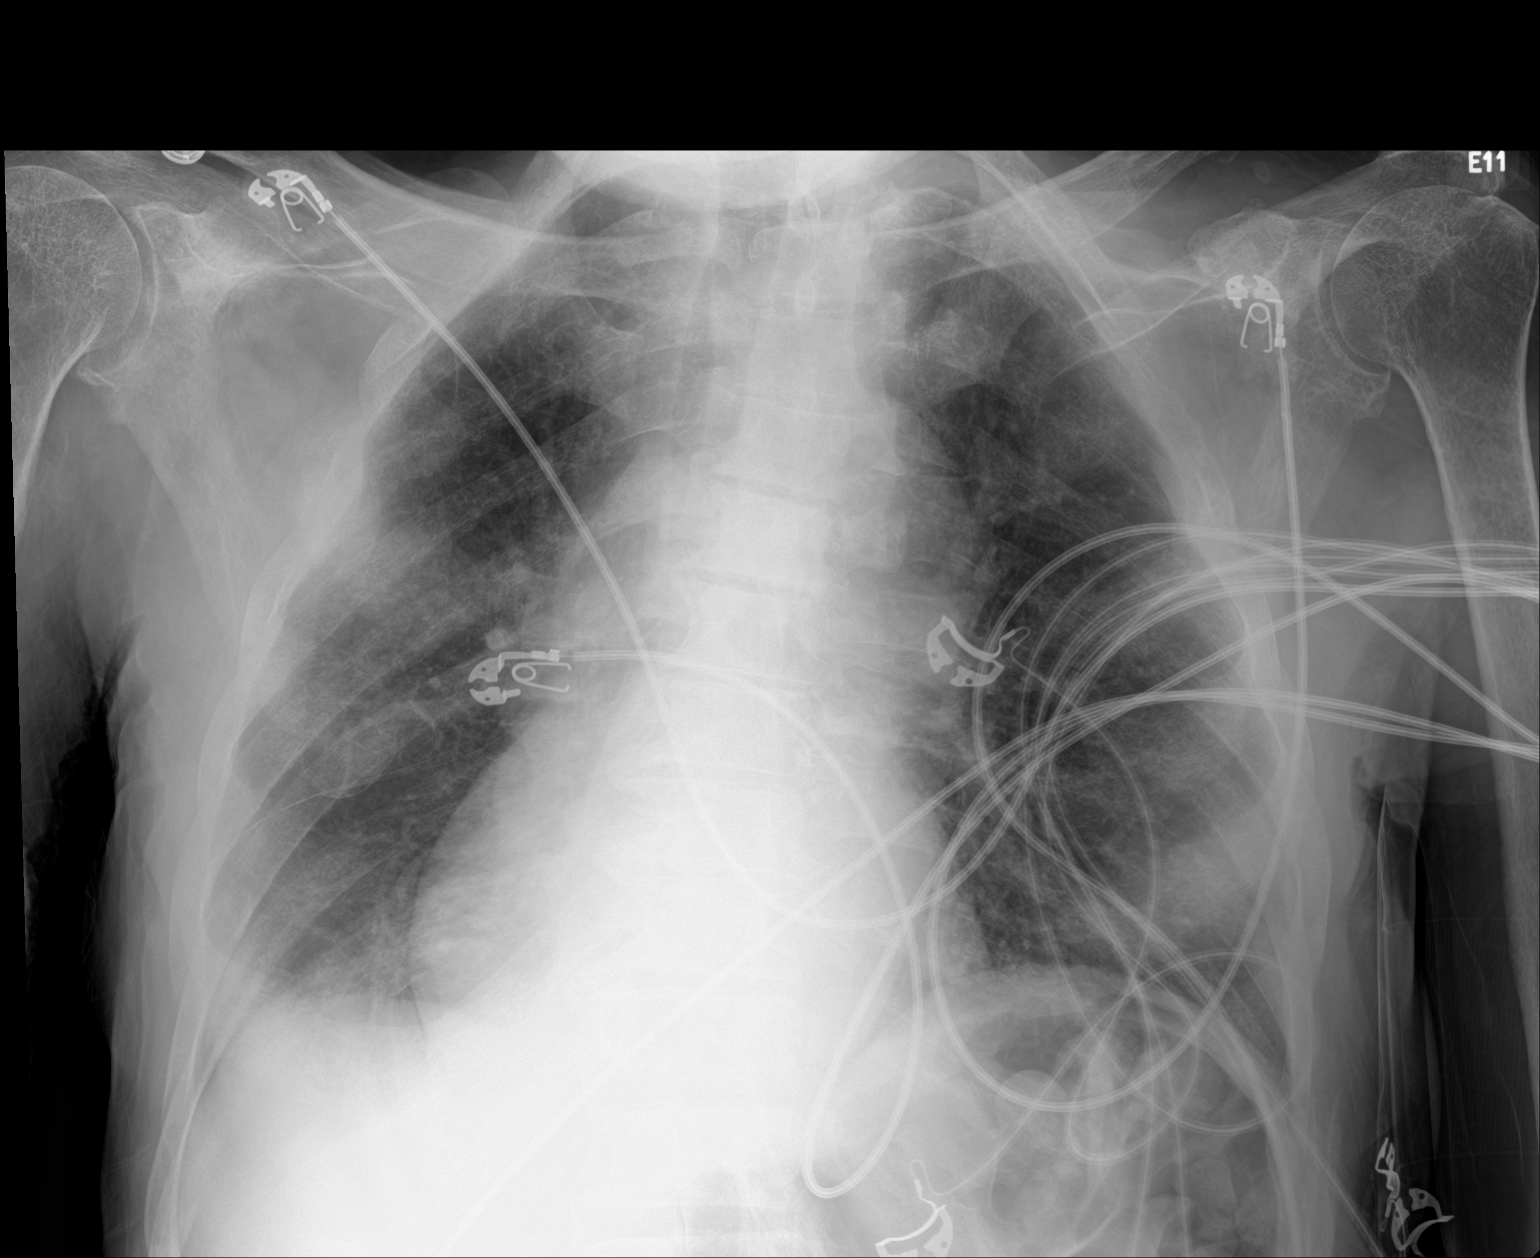

[1 of 1 positions shown; findings below may reference images not displayed]

FINDINGS: Lung volumes are diminished and image slightly rotated. Accounting
for this cardiomediastinal contours and hilar structures are stable.

Increased opacity in the RIGHT mid chest and RIGHT lower lobe since
previous imaging.

Sclerotic lesions in the bilateral ribs are again noted.

EKG leads project over the chest.
IMPRESSION: Areas of increased opacity in the RIGHT lower chest in particular,
raising the question of airspace disease/infection.

Multifocal sclerotic rib lesions in this patient with known prostate
cancer metastases.

## 2021-05-26 MED ORDER — LACTATED RINGERS IV SOLN: INTRAVENOUS | Status: AC

## 2021-05-26 MED ORDER — LEVOFLOXACIN IN D5W 750 MG/150ML IV SOLN: 750.0000 mg | INTRAVENOUS | Status: DC

## 2021-05-26 MED ORDER — LACTATED RINGERS IV BOLUS (SEPSIS)
1000.0000 mL | Freq: Once | INTRAVENOUS | Status: AC
  Administered 2021-05-26: 1000 mL via INTRAVENOUS

## 2021-05-26 MED ORDER — AMLODIPINE BESYLATE 5 MG PO TABS
7.5000 mg | ORAL_TABLET | Freq: Every day | ORAL | Status: DC
  Administered 2021-05-27 – 2021-06-09 (×14): 7.5 mg via ORAL
  Filled 2021-05-26 (×14): qty 2

## 2021-05-26 MED ORDER — LACTATED RINGERS IV SOLN: INTRAVENOUS | Status: DC

## 2021-05-26 MED ORDER — LEVOFLOXACIN IN D5W 750 MG/150ML IV SOLN
750.0000 mg | Freq: Once | INTRAVENOUS | Status: AC
  Administered 2021-05-26: 750 mg via INTRAVENOUS
  Filled 2021-05-26: qty 150

## 2021-05-26 MED ORDER — SODIUM CHLORIDE 0.9 % IV SOLN: INTRAVENOUS | Status: DC

## 2021-05-26 MED ORDER — SODIUM CHLORIDE 0.9% IV SOLUTION: Freq: Once | INTRAVENOUS | Status: AC

## 2021-05-26 MED ORDER — MAGIC MOUTHWASH W/LIDOCAINE
5.0000 mL | Freq: Four times a day (QID) | ORAL | Status: DC
  Administered 2021-05-26 – 2021-06-01 (×13): 5 mL via ORAL
  Filled 2021-05-26 (×57): qty 5

## 2021-05-26 MED ORDER — HEPARIN SODIUM (PORCINE) 5000 UNIT/ML IJ SOLN
5000.0000 [IU] | Freq: Three times a day (TID) | INTRAMUSCULAR | Status: DC
  Administered 2021-05-26 – 2021-05-28 (×5): 5000 [IU] via SUBCUTANEOUS
  Filled 2021-05-26 (×6): qty 1

## 2021-05-26 MED ORDER — LEVOFLOXACIN IN D5W 750 MG/150ML IV SOLN
750.0000 mg | INTRAVENOUS | Status: DC
  Administered 2021-05-28: 750 mg via INTRAVENOUS
  Filled 2021-05-26: qty 150

## 2021-05-26 MED ORDER — LACTATED RINGERS IV BOLUS
1000.0000 mL | Freq: Once | INTRAVENOUS | Status: AC
  Administered 2021-05-26: 1000 mL via INTRAVENOUS

## 2021-05-26 NOTE — Unmapped (Signed)
Since coming home from [facility], Elad has not been able to stand, walk, or eat because he is very weak.  He doesn't eat, he just says no. Sometimes he cannot get his words out. She had someone come to help him to the bedside commode and [place]er stated that it hurt to move.  His daughter Santina Evans doesn't know what to do.  She is very concerned.  Please call her at [phone].

## 2021-05-26 NOTE — Progress Notes (Signed)
PHARMACY NOTE -  [facility] has been assisting with dosing of Levaquin for CAP. Dosage remains stable at 750 mg IV q48 hr and further renal adjustments per institutional Pharmacy antibiotic protocol  Pharmacy will sign off, following peripherally for culture results or dose adjustments. Please reconsult if a change in clinical status warrants re-evaluation of dosage.  Reuel Boom, PharmD, BCPS [phone] 05/26/2021, 4:25 PM

## 2021-05-26 NOTE — Progress Notes (Signed)
NP [facility] notified that patient hemoglobin is 6.9

## 2021-05-26 NOTE — H&P (Signed)
History and Physical    Terry Mitchell QQV:[id number] DOB: Dec 26, 1950 DOA: 05/26/2021  PCP: [name], MD Patient coming from: home  Chief Complaint: Generalized weakness decreased p.o. intake and weight loss  HPI: Terry Mitchell is a 70 y.o. male with medical history significant of metastatic prostate cancer with mets to the bones.  He had finished his radiation therapy approximately a week ago.  History obtained from patient's daughter.  Patient also followed by a thorough care palliative care.  And he is a DNR.  He lives at home with his daughter Terry Mitchell.  He has metastatic prostate cancer with mets to the spine status post chemo and radiation.  Daughter concerned that his esophagus is burnt from radiation and he is having difficulty swallowing food or drinking liquids.  They have been using lidocaine solution which has been helping some however patient continues with not eating or drinking and losing weight with generalized weakness.  He has a history of paraplegia and he is unable to walk  Patient complains of abdominal pain he cannot explain more than that to me.  The daughter is not able to tell me anything more than that.  Patient has had no nausea vomiting or diarrhea.  She is not sure when his last bowel movement was.  She also reports that he has been more confused at home.  ED Course: Vital signs in the ED blood pressure 142/82 pulse is 103 respiration 20 temperature 97.8 saturation 98% on room air CBG was 137.  Review of Systems: As per HPI otherwise all other systems reviewed and are negative  Ambulatory Status: He does not walk at home  Past Medical History:  Diagnosis Date   Depression    recently went on disability d/t diagnosis   History of recent fall 01/2018   missed the last step on ladder, causing back discomfort   Hypertension    Prostate cancer ([place]) 12/2017   prostate    Past Surgical History:  Procedure Laterality Date   BACK SURGERY  2001    2  rods and 6 screws in back   COLONOSCOPY WITH PROPOFOL N/A 09/24/2019   Procedure: COLONOSCOPY WITH PROPOFOL;  Surgeon: Jonathon Bellows, MD;  Location: [facility] ENDOSCOPY;  Service: Gastroenterology;  Laterality: N/A;   Left shoulder surgery Left 1998   removed a piece of bone around collar bone   PROSTATE BIOPSY N/A 02/28/2018   Procedure: PROSTATE BIOPSY;  Surgeon: Abbie Sons, MD;  Location: [facility] ORS;  Service: Urology;  Laterality: N/A;   TRANSRECTAL ULTRASOUND N/A 02/28/2018   Procedure: TRANSRECTAL ULTRASOUND;  Surgeon: Abbie Sons, MD;  Location: [facility] ORS;  Service: Urology;  Laterality: N/A;    Social History   Socioeconomic History   Marital status: Single    Spouse name: Not on file   Number of children: 4   Years of education: Not on file   Highest education level: Not on file  Occupational History   Occupation: disability  Tobacco Use   Smoking status: Former    Packs/day: 2.00    Types: Cigars, Cigarettes    Quit date: 07/24/2019    Years since quitting: 1.8   Smokeless tobacco: Never   Tobacco comments:    2 cigars  Vaping Use   Vaping Use: Never used  Substance and Sexual Activity   Alcohol use: Yes    Alcohol/week: 9.0 standard drinks    Types: 9 Cans of beer per week    Comment: occasional  Drug use: Yes    Types: Cocaine, "Crack" cocaine    Comment: none within the year   Sexual activity: Not Currently  Other Topics Concern   Not on file  Social History Narrative   Not on file   Social Determinants of Health   Financial Resource Strain: Not on file  Food Insecurity: Not on file  Transportation Needs: Not on file  Physical Activity: Not on file  Stress: Not on file  Social Connections: Not on file  Intimate Partner Violence: Not on file    Allergies  Allergen Reactions   Penicillins Hives and Other (See Comments)    Hives, welts, swelling profusely . Pretty severe reaction Has patient had a PCN reaction causing immediate rash,  facial/tongue/throat swelling, SOB or lightheadedness with hypotension: No Has patient had a PCN reaction causing severe rash involving mucus membranes or skin necrosis: Yes Has patient had a PCN reaction that required hospitalization: Yes Has patient had a PCN reaction occurring within the last 10 years: No If all of the above answers are "NO", then may proceed with Cephalosporin use.    Lactose Intolerance (Gi) Other (See Comments)    Gi upset    Family History  Problem Relation Age of Onset   Stroke Mother    Kidney failure Mother    Diabetes Maternal Aunt       Prior to Admission medications   Medication Sig Start Date End Date Taking? Authorizing Provider  acetaminophen (TYLENOL) 325 MG tablet Take 1-2 tablets (325-650 mg total) by mouth every 4 (four) hours as needed for mild pain. 01/12/21   Love, Ivan Anchors, PA-C  albuterol (VENTOLIN HFA) 108 (90 Base) MCG/ACT inhaler Inhale 2 puffs into the lungs every 6 (six) hours as needed for wheezing or shortness of breath. Patient not taking: Reported on 01/24/2021 05/22/19   [name], MD  amLODipine (NORVASC) 5 MG tablet Take 1.5 tablets (7.5 mg total) by mouth daily. 01/12/21   Love, Ivan Anchors, PA-C  benzonatate (TESSALON) 100 MG capsule Take 1 capsule (100 mg total) by mouth 3 (three) times daily as needed for cough. 01/12/21   Love, Ivan Anchors, PA-C  dexamethasone (DECADRON) 2 MG tablet Take 1 tablet (2 mg total) by mouth daily. 01/13/21   Love, Ivan Anchors, PA-C  enoxaparin (LOVENOX) 40 MG/0.4ML injection Inject 0.4 mLs (40 mg total) into the skin daily. 01/13/21   Love, Ivan Anchors, PA-C  gabapentin (NEURONTIN) 300 MG capsule Take 300 mg by mouth 2 (two) times daily. 03/10/21   [provider]  HYDROmorphone (DILAUDID) 2 MG tablet Take 1 tablet (2 mg total) by mouth every 6 (six) hours as needed for up to 12 doses for severe pain. 03/19/21   Wyvonnia Dusky, MD  ondansetron (ZOFRAN) 4 MG tablet Take 2 tablets (8 mg total) by mouth every 8  (eight) hours as needed for nausea or vomiting. 01/12/21   Love, Ivan Anchors, PA-C  oxyCODONE-acetaminophen (PERCOCET) 5-325 MG tablet Take 1-2 tablets by mouth every 6 (six) hours as needed for severe pain. 01/12/21 01/12/22  Love, Ivan Anchors, PA-C  polyethylene glycol (MIRALAX / GLYCOLAX) 17 g packet Take 17 g by mouth 2 (two) times daily. 01/12/21   Love, Ivan Anchors, PA-C  vitamin B-12 (CYANOCOBALAMIN) 1000 MCG tablet Take 1 tablet (1,000 mcg total) by mouth daily. 01/12/21   Love, Ivan Anchors, PA-C  prochlorperazine (COMPAZINE) 10 MG tablet Take 1 tablet (10 mg total) by mouth every 6 (six) hours as needed (Nausea  or vomiting). Patient not taking: Reported on 11/02/2019 05/01/19 04/08/20  [name], MD    Physical Exam: Vitals:   05/26/21 1530 05/26/21 1600 05/26/21 1630 05/26/21 1700  BP: 138/78 134/79 140/81 137/83  Pulse: 100 (!) 101 100 (!) 108  Resp: (!) 9 17 (!) 23 (!) 33  Temp:      TempSrc:      SpO2: 98% 98% 99% 98%  Weight:      Height:         General:  Appears chronically ill frail cachectic Eyes:  PERRL, EOMI, normal lids, iris ENT:  grossly normal hearing, lips & tongue, oral mucosa dry neck:  no LAD, masses or thyromegaly Cardiovascular:  RRR, no m/r/g. No LE edema.  Tachycardic Respiratory: Coarse CTA bilaterally, no w/r/r. Normal respiratory effort. Abdomen:  soft, tender to touch distended Skin:  no rash or induration seen on limited exam Musculoskeletal: Bilateral 1+ pitting lower extremity edema  psychiatric:  grossly normal mood and affect, speech stuttering noted not oriented to place time Neurologic: Awake  labs on Admission: I have personally reviewed following labs and imaging studies  CBC: Recent Labs  Lab 05/26/21 1403  WBC 3.4*  NEUTROABS 2.6  HGB 7.5*  HCT 25.0*  MCV 102.0*  PLT 466*   Basic Metabolic Panel: Recent Labs  Lab 05/26/21 1403  NA 144  K 4.2  CL 105  CO2 28  GLUCOSE 103*  BUN 73*  CREATININE 3.27*  CALCIUM 11.5*   GFR: Estimated  Creatinine Clearance: 23.7 mL/min (A) (by C-G formula based on SCr of 3.27 mg/dL (H)). Liver Function Tests: Recent Labs  Lab 05/26/21 1403  AST 40  ALT 12  ALKPHOS 697*  BILITOT 1.2  PROT 6.3*  ALBUMIN 3.3*   No results for input(s): LIPASE, AMYLASE in the last 168 hours. No results for input(s): AMMONIA in the last 168 hours. Coagulation Profile: No results for input(s): INR, PROTIME in the last 168 hours. Cardiac Enzymes: No results for input(s): CKTOTAL, CKMB, CKMBINDEX, TROPONINI in the last 168 hours. BNP (last 3 results) No results for input(s): PROBNP in the last 8760 hours. HbA1C: No results for input(s): HGBA1C in the last 72 hours. CBG: No results for input(s): GLUCAP in the last 168 hours. Lipid Profile: No results for input(s): CHOL, HDL, LDLCALC, TRIG, CHOLHDL, LDLDIRECT in the last 72 hours. Thyroid Function Tests: No results for input(s): TSH, T4TOTAL, FREET4, T3FREE, THYROIDAB in the last 72 hours. Anemia Panel: No results for input(s): VITAMINB12, FOLATE, FERRITIN, TIBC, IRON, RETICCTPCT in the last 72 hours. Urine analysis:    Component Value Date/Time   COLORURINE YELLOW 05/26/2021 1406   APPEARANCEUR HAZY (A) 05/26/2021 1406   APPEARANCEUR Clear 01/01/2018 1520   LABSPEC 1.018 05/26/2021 1406   PHURINE 5.0 05/26/2021 1406   GLUCOSEU NEGATIVE 05/26/2021 1406   HGBUR SMALL (A) 05/26/2021 1406   BILIRUBINUR NEGATIVE 05/26/2021 1406   BILIRUBINUR Negative 01/01/2018 1520   KETONESUR 5 (A) 05/26/2021 1406   PROTEINUR NEGATIVE 05/26/2021 1406   NITRITE NEGATIVE 05/26/2021 1406   LEUKOCYTESUR NEGATIVE 05/26/2021 1406    Creatinine Clearance: Estimated Creatinine Clearance: 23.7 mL/min (A) (by C-G formula based on SCr of 3.27 mg/dL (H)).  Sepsis Labs: @LABRCNTIP (procalcitonin:4,lacticidven:4) ) Recent Results (from the past 240 hour(s))  Resp Panel by RT-PCR (Flu A&B, Covid) Nasopharyngeal Swab     Status: None   Collection Time: 05/26/21  3:30 PM    Specimen: Nasopharyngeal Swab; Nasopharyngeal(NP) swabs in vial transport medium  Result Value  Ref Range Status   SARS Coronavirus 2 by RT PCR NEGATIVE NEGATIVE Final    Comment: (NOTE) SARS-CoV-2 target nucleic acids are NOT DETECTED.  The SARS-CoV-2 RNA is generally detectable in upper respiratory specimens during the acute phase of infection. The lowest concentration of SARS-CoV-2 viral copies this assay can detect is 138 copies/mL. A negative result does not preclude SARS-Cov-2 infection and should not be used as the sole basis for treatment or other patient management decisions. A negative result may occur with  improper specimen collection/handling, submission of specimen other than nasopharyngeal swab, presence of viral mutation(s) within the areas targeted by this assay, and inadequate number of viral copies(<138 copies/mL). A negative result must be combined with clinical observations, patient history, and epidemiological information. The expected result is Negative.  Fact Sheet for Patients:  EntrepreneurPulse.com.au  Fact Sheet for Healthcare Providers:  IncredibleEmployment.be  This test is no t yet approved or cleared by the [place] FDA and  has been authorized for detection and/or diagnosis of SARS-CoV-2 by FDA under an Emergency Use Authorization (EUA). This EUA will remain  in effect (meaning this test can be used) for the duration of the COVID-19 declaration under Section 564(b)(1) of the Act, 21 U.S.C.section 360bbb-3(b)(1), unless the authorization is terminated  or revoked sooner.       Influenza A by PCR NEGATIVE NEGATIVE Final   Influenza B by PCR NEGATIVE NEGATIVE Final    Comment: (NOTE) The Xpert Xpress SARS-CoV-2/FLU/RSV plus assay is intended as an aid in the diagnosis of influenza from Nasopharyngeal swab specimens and should not be used as a sole basis for treatment. Nasal washings and aspirates are  unacceptable for Xpert Xpress SARS-CoV-2/FLU/RSV testing.  Fact Sheet for Patients: EntrepreneurPulse.com.au  Fact Sheet for Healthcare Providers: IncredibleEmployment.be  This test is not yet approved or cleared by the [place] FDA and has been authorized for detection and/or diagnosis of SARS-CoV-2 by FDA under an Emergency Use Authorization (EUA). This EUA will remain in effect (meaning this test can be used) for the duration of the COVID-19 declaration under Section 564(b)(1) of the Act, 21 U.S.C. section 360bbb-3(b)(1), unless the authorization is terminated or revoked.  Performed at [facility], [place] [address], [place], [place] [address]      Radiological Exams on Admission: DG Chest Port 1 View  Result Date: 05/26/2021 CLINICAL DATA:  History of prostate cancer. EXAM: PORTABLE CHEST 1 VIEW COMPARISON:  Weakness, increased weakness.  Diminished appetite. FINDINGS: Lung volumes are diminished and image slightly rotated. Accounting for this cardiomediastinal contours and hilar structures are stable. Increased opacity in the RIGHT mid chest and RIGHT lower lobe since previous imaging. Sclerotic lesions in the bilateral ribs are again noted. EKG leads project over the chest. IMPRESSION: Areas of increased opacity in the RIGHT lower chest in particular, raising the question of airspace disease/infection. Multifocal sclerotic rib lesions in this patient with known prostate cancer metastases. Electronically Signed   By: Zetta Bills M.D.   On: 05/26/2021 15:06    EKG: Sinus tach  Assessment/Plan Active Problems:   CAP (community acquired pneumonia)    #1 community-acquired pneumonia/hospital-acquired pneumonia patient has been in and out of hospitals and rehabs for few weeks to months.  He is admitted with a cough generalized weakness shortness of breath decreased p.o. intake.   He was not hypoxic in the ER Chest x-ray  shows right sided infiltrates consistent with consolidation Patient is allergic to cephalosporin Consulted pharmacy for levofloxacin with AKI  #  2 AKI secondary to decreased p.o. intake and dehydration Will start him on IV fluids Renal ultrasound Follow-up BMP in a.m.  #3 hypercalcemia due to dehydration recheck labs in a.m. after IV fluids  #4 pancytopenia due to recent chemo likely monitor daily  #5 elevated alkaline phosphatase due to bony mets  #6 prostate cancer with mets to the bones and spine followed at [facility] he is status postradiation and chemo  #7 dysphagia likely related to radiation induced esophagitis consult speech therapy and lidocaine miracle mouthwash  #8 abdominal pain check abdominal ultrasound will consider CT of the abdomen and pelvis once creatinine improves  Estimated body mass index is 26.61 kg/m as calculated from the following:   Height as of this encounter: 6' (1.829 m).   Weight as of this encounter: 89 kg.   DVT prophylaxis: SCD Code Status: DNR Family Communication: Discussed with daughter Disposition Plan: Pending clinical improvement Consults called: None Admission status: Inpatient   Georgette Shell MD  05/26/2021, 5:16 PM

## 2021-05-26 NOTE — Progress Notes (Signed)
Lab called critical Hgb: 6.4, MD Rodena Piety notified. Stat Hemoglobin and Hematocrit and fecal occult blood ordered.

## 2021-05-26 NOTE — Progress Notes (Signed)
Patient's daughter Docia Furl was contacted to get consent for blood transfusion because is not oriented to sign for him self. Verified by second RN Bishnu Paudel

## 2021-05-26 NOTE — ED Provider Notes (Signed)
[facility] DEPT Provider Note   CSN: [id number] Arrival date & time: 05/26/21  1333     History No chief complaint on file.   Terry Mitchell is a 69 y.o. male.  70 year old male presents with increased weakness from home.  Has history of prostate as well as bone cancer.  Has had increased weakness with past several days.  He has had no appetite.  He denies any chest or abdominal discomfort.  No new headaches.  No urinary symptoms.  EMS called and patient CBG was 137.  Is found to be afebrile he was transported here.      Past Medical History:  Diagnosis Date   Depression    recently went on disability d/t diagnosis   History of recent fall 01/2018   missed the last step on ladder, causing back discomfort   Hypertension    Prostate cancer ([place]) 12/2017   prostate    Patient Active Problem List   Diagnosis Date Noted   High blood pressure 01/12/2021   Thrombocytopenia ([place]) 01/12/2021   Paraplegia ([place]) 12/20/2020   Pain of metastatic malignancy 12/14/2020   Cord compression ([place]) 12/11/2020   Stage 3b chronic kidney disease ([place]) 04/06/2020   Encounter for antineoplastic chemotherapy 04/06/2020   Hot flash in male 07/27/2019   Prostate cancer metastatic to bone ([place]) 05/15/2019   Prostate cancer ([place]) 03/19/2018   Androgen deprivation therapy 03/19/2018   PSA elevation 03/19/2018   Goals of care, counseling/discussion 03/19/2018   Chest pain 02/20/2018    Past Surgical History:  Procedure Laterality Date   BACK SURGERY  2001    2 rods and 6 screws in back   COLONOSCOPY WITH PROPOFOL N/A 09/24/2019   Procedure: COLONOSCOPY WITH PROPOFOL;  Surgeon: Jonathon Bellows, MD;  Location: [facility] ENDOSCOPY;  Service: Gastroenterology;  Laterality: N/A;   Left shoulder surgery Left 1998   removed a piece of bone around collar bone   PROSTATE BIOPSY N/A 02/28/2018   Procedure: PROSTATE BIOPSY;  Surgeon: Abbie Sons, MD;  Location: [facility] ORS;   Service: Urology;  Laterality: N/A;   TRANSRECTAL ULTRASOUND N/A 02/28/2018   Procedure: TRANSRECTAL ULTRASOUND;  Surgeon: Abbie Sons, MD;  Location: [facility] ORS;  Service: Urology;  Laterality: N/A;       Family History  Problem Relation Age of Onset   Stroke Mother    Kidney failure Mother    Diabetes Maternal Aunt     Social History   Tobacco Use   Smoking status: Former    Packs/day: 2.00    Types: Cigars, Cigarettes    Quit date: 07/24/2019    Years since quitting: 1.8   Smokeless tobacco: Never   Tobacco comments:    2 cigars  Vaping Use   Vaping Use: Never used  Substance Use Topics   Alcohol use: Yes    Alcohol/week: 9.0 standard drinks    Types: 9 Cans of beer per week    Comment: occasional   Drug use: Yes    Types: Cocaine, "Crack" cocaine    Comment: none within the year    Home Medications Prior to Admission medications   Medication Sig Start Date End Date Taking? Authorizing Provider  acetaminophen (TYLENOL) 325 MG tablet Take 1-2 tablets (325-650 mg total) by mouth every 4 (four) hours as needed for mild pain. 01/12/21   Love, Ivan Anchors, PA-C  albuterol (VENTOLIN HFA) 108 (90 Base) MCG/ACT inhaler Inhale 2 puffs into the lungs every 6 (six) hours  as needed for wheezing or shortness of breath. Patient not taking: Reported on 01/24/2021 05/22/19   [name], MD  amLODipine (NORVASC) 5 MG tablet Take 1.5 tablets (7.5 mg total) by mouth daily. 01/12/21   Love, Ivan Anchors, PA-C  benzonatate (TESSALON) 100 MG capsule Take 1 capsule (100 mg total) by mouth 3 (three) times daily as needed for cough. 01/12/21   Love, Ivan Anchors, PA-C  dexamethasone (DECADRON) 2 MG tablet Take 1 tablet (2 mg total) by mouth daily. 01/13/21   Love, Ivan Anchors, PA-C  enoxaparin (LOVENOX) 40 MG/0.4ML injection Inject 0.4 mLs (40 mg total) into the skin daily. 01/13/21   Love, Ivan Anchors, PA-C  gabapentin (NEURONTIN) 300 MG capsule Take 300 mg by mouth 2 (two) times daily. 03/10/21   [provider]  HYDROmorphone (DILAUDID) 2 MG tablet Take 1 tablet (2 mg total) by mouth every 6 (six) hours as needed for up to 12 doses for severe pain. 03/19/21   Wyvonnia Dusky, MD  ondansetron (ZOFRAN) 4 MG tablet Take 2 tablets (8 mg total) by mouth every 8 (eight) hours as needed for nausea or vomiting. 01/12/21   Love, Ivan Anchors, PA-C  oxyCODONE-acetaminophen (PERCOCET) 5-325 MG tablet Take 1-2 tablets by mouth every 6 (six) hours as needed for severe pain. 01/12/21 01/12/22  Love, Ivan Anchors, PA-C  polyethylene glycol (MIRALAX / GLYCOLAX) 17 g packet Take 17 g by mouth 2 (two) times daily. 01/12/21   Love, Ivan Anchors, PA-C  vitamin B-12 (CYANOCOBALAMIN) 1000 MCG tablet Take 1 tablet (1,000 mcg total) by mouth daily. 01/12/21   Love, Ivan Anchors, PA-C  prochlorperazine (COMPAZINE) 10 MG tablet Take 1 tablet (10 mg total) by mouth every 6 (six) hours as needed (Nausea or vomiting). Patient not taking: Reported on 11/02/2019 05/01/19 04/08/20  [name], MD    Allergies    Penicillins and Lactose intolerance (gi)  Review of Systems   Review of Systems  All other systems reviewed and are negative.  Physical Exam Updated Vital Signs BP 135/66 (BP Location: Right Arm)   Pulse 100   Temp 97.8 F (36.6 C) (Oral)   Resp 20   Ht 1.829 m (6')   Wt 89 kg   SpO2 97%   BMI 26.61 kg/m   Physical Exam Vitals and nursing note reviewed.  Constitutional:      General: He is not in acute distress.    Appearance: Normal appearance. He is well-developed. He is not toxic-appearing.  HENT:     Head: Normocephalic and atraumatic.  Eyes:     General: Lids are normal.     Conjunctiva/sclera: Conjunctivae normal.     Pupils: Pupils are equal, round, and reactive to light.  Neck:     Thyroid: No thyroid mass.     Trachea: No tracheal deviation.  Cardiovascular:     Rate and Rhythm: Normal rate and regular rhythm.     Heart sounds: Normal heart sounds. No murmur heard.   No gallop.  Pulmonary:      Effort: Pulmonary effort is normal. No respiratory distress.     Breath sounds: Normal breath sounds. No stridor. No decreased breath sounds, wheezing, rhonchi or rales.  Abdominal:     General: There is no distension.     Palpations: Abdomen is soft.     Tenderness: There is no abdominal tenderness. There is no rebound.  Musculoskeletal:        General: No tenderness. Normal range of motion.  Cervical back: Normal range of motion and neck supple.  Skin:    General: Skin is warm and dry.     Findings: No abrasion or rash.  Neurological:     Mental Status: He is oriented to person, place, and time. He is lethargic.     GCS: GCS eye subscore is 4. GCS verbal subscore is 5. GCS motor subscore is 6.     Cranial Nerves: No cranial nerve deficit.     Sensory: No sensory deficit.  Psychiatric:        Attention and Perception: Attention normal.        Mood and Affect: Affect is blunt and flat.        Speech: Speech is delayed.        Behavior: Behavior is slowed.    ED Results / Procedures / Treatments   Labs (all labs ordered are listed, but only abnormal results are displayed) Labs Reviewed  RESP PANEL BY RT-PCR (FLU A&B, COVID) ARPGX2  URINALYSIS, ROUTINE W REFLEX MICROSCOPIC  CBC WITH DIFFERENTIAL/PLATELET  COMPREHENSIVE METABOLIC PANEL    EKG EKG Interpretation  Date/Time:  Friday May 26 2021 13:49:14 EDT Ventricular Rate:  105 PR Interval:  137 QRS Duration: 90 QT Interval:  366 QTC Calculation: 484 R Axis:   62 Text Interpretation: Sinus tachycardia Abnormal R-wave progression, early transition Borderline repol abnormality, diffuse leads Borderline ST elevation, lateral leads Borderline prolonged QT interval Confirmed by Lacretia Leigh ([id number]) on 05/26/2021 2:11:40 PM  Radiology No results found.  Procedures Procedures   Medications Ordered in ED Medications  lactated ringers infusion (has no administration in time range)  lactated ringers bolus 1,000 mL  (has no administration in time range)    ED Course  I have reviewed the triage vital signs and the nursing notes.  Pertinent labs & imaging results that were available during my care of the patient were reviewed by me and considered in my medical decision making (see chart for details).    MDM Rules/Calculators/A&P                           Has evidence of pneumonia on chest x-ray.  Sepsis protocol initiated with IV fluids.  Has evidence of acute kidney injury on electrolytes.  Patient's mental status improving.  Will admit to the hospital service  CRITICAL CARE Performed by: Leota Jacobsen Total critical care time: 55 minutes Critical care time was exclusive of separately billable procedures and treating other patients. Critical care was necessary to treat or prevent imminent or life-threatening deterioration. Critical care was time spent personally by me on the following activities: development of treatment plan with patient and/or surrogate as well as nursing, discussions with consultants, evaluation of patient's response to treatment, examination of patient, obtaining history from patient or surrogate, ordering and performing treatments and interventions, ordering and review of laboratory studies, ordering and review of radiographic studies, pulse oximetry and re-evaluation of patient's condition.  Final Clinical Impression(s) / ED Diagnoses Final diagnoses:  None    Rx / DC Orders ED Discharge Orders     None        Lacretia Leigh, MD 05/26/21 1615

## 2021-05-26 NOTE — ED Triage Notes (Signed)
Pt BIB [facility] from home c/o increased weakness. Pt has a hx of prostate and bone cancer. Pt finished radiation on Friday and has since noticed increased weakness. Family states his appetite has diminished as well.   Vital signs were: 142/82 103-HR 20-RR 98% RA 97.8 Temp 137-CBG

## 2021-05-26 NOTE — Unmapped (Signed)
Spoke with daughter. Pt is not eating and is refusing to move from his wheelchair. Is also refusing to transfer to the toilet. She also reports his mentation appears to be waxing and waning. We discussed his changes and she plans to try to do a video visit with his Oncologist as he was supposed to see them today. Additionally, we discussed calling an ambulance and going to the ED for failure to thrive/neuro changes for further work up.

## 2021-05-27 DIAGNOSIS — J189 Pneumonia, unspecified organism: Secondary | ICD-10-CM | POA: Diagnosis not present

## 2021-05-27 DIAGNOSIS — R7989 Other specified abnormal findings of blood chemistry: Secondary | ICD-10-CM

## 2021-05-27 LAB — COMPREHENSIVE METABOLIC PANEL
ALT: 11 U/L (ref 0–44)
AST: 37 U/L (ref 15–41)
Albumin: 2.4 g/dL — ABNORMAL LOW (ref 3.5–5.0)
Alkaline Phosphatase: 514 U/L — ABNORMAL HIGH (ref 38–126)
Anion gap: 8 (ref 5–15)
BUN: 61 mg/dL — ABNORMAL HIGH (ref 8–23)
CO2: 27 mmol/L (ref 22–32)
Calcium: 10.5 mg/dL — ABNORMAL HIGH (ref 8.9–10.3)
Chloride: 107 mmol/L (ref 98–111)
Creatinine, Ser: 2.34 mg/dL — ABNORMAL HIGH (ref 0.61–1.24)
GFR, Estimated: 30 mL/min — ABNORMAL LOW (ref >60)
Glucose, Bld: 96 mg/dL (ref 70–99)
Potassium: 3.5 mmol/L (ref 3.5–5.1)
Sodium: 142 mmol/L (ref 135–145)
Total Bilirubin: 1.5 mg/dL — ABNORMAL HIGH (ref 0.3–1.2)
Total Protein: 5.1 g/dL — ABNORMAL LOW (ref 6.5–8.1)

## 2021-05-27 LAB — CBC
HCT: 25.5 % — ABNORMAL LOW (ref 39.0–52.0)
Hemoglobin: 7.8 g/dL — ABNORMAL LOW (ref 13.0–17.0)
MCH: 30.4 pg (ref 26.0–34.0)
MCHC: 30.6 g/dL (ref 30.0–36.0)
MCV: 99.2 fL (ref 80.0–100.0)
Platelets: 103 10*3/uL — ABNORMAL LOW (ref 150–400)
RBC: 2.57 MIL/uL — ABNORMAL LOW (ref 4.22–5.81)
RDW: 18.9 % — ABNORMAL HIGH (ref 11.5–15.5)
WBC: 3.6 10*3/uL — ABNORMAL LOW (ref 4.0–10.5)
nRBC: 1.1 % — ABNORMAL HIGH (ref 0.0–0.2)

## 2021-05-27 NOTE — Evaluation (Signed)
Clinical/Bedside Swallow Evaluation Patient Details  Name: Terry Mitchell MRN: [id number] Date of Birth: 16-Nov-1950  Today's Date: 05/27/2021 Time: SLP Start Time (ACUTE ONLY): 1422 SLP Stop Time (ACUTE ONLY): 1440 SLP Time Calculation (min) (ACUTE ONLY): 18 min  Past Medical History:  Past Medical History:  Diagnosis Date   Depression    recently went on disability d/t diagnosis   History of recent fall 01/2018   missed the last step on ladder, causing back discomfort   Hypertension    Prostate cancer ([place]) 12/2017   prostate   Past Surgical History:  Past Surgical History:  Procedure Laterality Date   BACK SURGERY  2001    2 rods and 6 screws in back   COLONOSCOPY WITH PROPOFOL N/A 09/24/2019   Procedure: COLONOSCOPY WITH PROPOFOL;  Surgeon: Jonathon Bellows, MD;  Location: [facility] ENDOSCOPY;  Service: Gastroenterology;  Laterality: N/A;   Left shoulder surgery Left 1998   removed a piece of bone around collar bone   PROSTATE BIOPSY N/A 02/28/2018   Procedure: PROSTATE BIOPSY;  Surgeon: Abbie Sons, MD;  Location: [facility] ORS;  Service: Urology;  Laterality: N/A;   TRANSRECTAL ULTRASOUND N/A 02/28/2018   Procedure: TRANSRECTAL ULTRASOUND;  Surgeon: Abbie Sons, MD;  Location: [facility] ORS;  Service: Urology;  Laterality: N/A;   HPI:  Terry Mitchell is a 70 y.o. male history of hypertension, prostate cancer with spinal, rib, and other bony metastasis, present emergency department with chest pain and arm pain.  CXR remarkable for opacity in the R lower lobe.  MRI total spine from 04/05/21 shows diffuse metastatic disease with epidural thickening and spinal canal stenosis from C3-5 without cord signal change, and left foraminal invasion at C5-6.  Pt completed inpatient rehab with PT & OT (no ST) at [facility] from 10/15-11/01.    Assessment / Plan / Recommendation  Clinical Impression  Pt was seen for a bedside swallow evaluation secondary to complaints of odynophagia following  chemoradiation.  Pt exhibited some confusion and had difficulty answering questions relating to his medical history, but he reported that he has had odynophagia with all PO intake for an unknown period of time, and that he prefers to consume soft solids and thin liquids.  No prior bedside swallow evaluation or modified barium swallow study per pt report and chart review.   Pt consumed trials of thin liquid and puree.  He exhibited facial grimacing with all trials and reported mild-moderate odynophagia when asked. He politely refused regular solids secondary to odynophagia with liquid and puree.  No overt s/sx of aspiration were observed with any PO trials in this session.  Given odynophagia, recent chemoradiation, concerning CXR, and cervical spinal hx, recommend a modified barium swallow study to further evaluate swallow function.  Additionally recommend Dysphagia 2 (fine chop) solids and thin liquids per pt preference with medication administered whole in puree or with thin liquid (per pt preference).  SLP Visit Diagnosis: Dysphagia, unspecified (R13.10)    Aspiration Risk  Mild aspiration risk    Diet Recommendation Dysphagia 2 (Fine chop);Thin liquid   Liquid Administration via: Cup;Straw Medication Administration: Whole meds with liquid Supervision: Staff to assist with self feeding Compensations: Slow rate;Small sips/bites    Other  Recommendations Oral Care Recommendations: Oral care BID    Recommendations for follow up therapy are one component of a multi-disciplinary discharge planning process, led by the attending physician.  Recommendations may be updated based on patient status, additional functional criteria and insurance authorization.  Follow up Recommendations Outpatient SLP      Frequency and Duration min 2x/week  2 weeks       Prognosis Prognosis for Safe Diet Advancement: Fair      Swallow Study   General HPI: Terry Mitchell is a 70 y.o. male history of  hypertension, prostate cancer with spinal, rib, and other bony metastasis, present emergency department with chest pain and arm pain.  CXR remarkable for opacity in the R lower lobe.  MRI total spine from 04/05/21 shows diffuse metastatic disease with epidural thickening and spinal canal stenosis from C3-5 without cord signal change, and left foraminal invasion at C5-6.  Pt completed inpatient rehab with PT & OT (no ST) at [facility] from 10/15-11/01. Type of Study: Bedside Swallow Evaluation Previous Swallow Assessment: N/A (N/A) Diet Prior to this Study: Regular;Thin liquids Temperature Spikes Noted: Yes Respiratory Status: Room air History of Recent Intubation: No Behavior/Cognition: Alert;Cooperative;Pleasant mood;Confused Oral Cavity Assessment: Within Functional Limits Oral Care Completed by SLP: No Vision: Functional for self-feeding Self-Feeding Abilities: Able to feed self;Needs set up Patient Positioning: Upright in bed Baseline Vocal Quality: Low vocal intensity Volitional Cough: Cognitively unable to elicit Volitional Swallow: Able to elicit    Oral/Motor/Sensory Function Overall Oral Motor/Sensory Function: Within functional limits   Ice Chips Ice chips: Not tested   Thin Liquid Thin Liquid: Within functional limits    Nectar Thick Nectar Thick Liquid: Not tested   Honey Thick Honey Thick Liquid: Not tested   Puree Puree: Within functional limits   Solid     Solid:  (Pt polietly refused)     Colin Mulders M.S., [place] [facility] Office: [phone]  [place] 05/27/2021,3:00 PM

## 2021-05-27 NOTE — Progress Notes (Signed)
Patient refused MRI at [place].

## 2021-05-27 NOTE — Progress Notes (Signed)
PROGRESS NOTE    Terry Mitchell  MPN:[id number] DOB: [birth year] DOA: 05/26/2021 PCP: [name], MD  Brief Narrative: Terry Mitchell is a 70 y.o. male with medical history significant of metastatic prostate cancer with mets to the bones.  He had finished his radiation therapy approximately a week ago.  History obtained from patient's daughter.  Patient also followed by a thorough care palliative care.  And he is a DNR.  He lives at home with his daughter Terry Mitchell.   He has metastatic prostate cancer with mets to the spine status post chemo and radiation.  Daughter concerned that his esophagus is burnt from radiation and he is having difficulty swallowing food or drinking liquids.  They have been using lidocaine solution which has been helping some however patient continues with not eating or drinking and losing weight with generalized weakness.  He has a history of paraplegia and he is unable to walk  Patient complains of abdominal pain he cannot explain more than that to me.  The daughter is not able to tell me anything more than that.  Patient has had no nausea vomiting or diarrhea.  She is not sure when his last bowel movement was.  She also reports that he has been more confused at home.   ED Course: Vital signs in the ED blood pressure 142/82 pulse is 103 respiration 20 temperature 97.8 saturation 98% on room air CBG was 137.  Assessment & Plan:   Active Problems:   CAP (community acquired pneumonia)      #1 community-acquired pneumonia/hospital-acquired pneumonia patient has been in and out of hospitals and rehabs for few weeks to months.  He is admitted with a cough generalized weakness shortness of breath decreased p.o. intake.   He was not hypoxic in the ER Chest x-ray shows right sided infiltrates consistent with consolidation Patient is allergic to cephalosporin Started Levaquin 750 mg every 48 hours to adjust for renal function.    #2 AKI secondary to decreased p.o.  intake and dehydration.  Creatinine 2.34 from 3.27 on admission.  Baseline creatinine is around 1.3-1.4.  Continue IV fluids.Follow-up BMP in a.m.   #3 hypercalcemia due to dehydration recheck labs in a.m. after IV fluids calcium trending down to 10.5 from 11.5   #4  Anemia/pancytopenia improving due to recent chemo likely monitor daily.  Hemoglobin was 6.4 and a stat repeat hemoglobin came back 6.9 he received 1 unit of packed RBC.  FOBT is pending.  Hemoglobin 7.8 today after 1 unit of transfusion.   #5 elevated alkaline phosphatase due to bony mets   #6 prostate cancer with mets to the bones and spine followed at [facility] he is status postradiation and chemo   #7 dysphagia likely related to radiation induced esophagitis consult speech therapy and lidocaine miracle mouthwash   #8 abdominal pain check abdominal ultrasound will consider CT of the abdomen and pelvis once creatinine improves  #9 paraplegia secondary to spinal mets patient has not walked in a long time he is able to wiggle his toes in both feet at the most.  Estimated body mass index is 26.61 kg/m as calculated from the following:   Height as of this encounter: 6' (1.829 m).   Weight as of this encounter: 89 kg.  DVT prophylaxis: SCD  code Status: DNR  family Communication: Discussed with daughter Disposition Plan:  Status is: Inpatient  Remains inpatient appropriate because: AKI dehydration, pancytopenia metastatic prostate cancer   Consultants:  None  Procedures: None Antimicrobials: Levofloxacin  Subjective: Patient is resting in bed staff was feeding him breakfast which he was able to swallow applesauce and soft food still with some dysphagia He feels his breathing is better he is more awake than yesterday he was able to answer all my questions appropriately and follow commands Abdominal ultrasound with no acute findings  Objective: Vitals:   05/27/21 0100 05/27/21 0336 05/27/21 0553 05/27/21 1015   BP: 110/65 117/68 119/73 118/65  Pulse: 86 90 92 85  Resp: 18 18 18 16   Temp: 98.6 F (37 C) 98.4 F (36.9 C) 98.2 F (36.8 C) (!) 97.5 F (36.4 C)  TempSrc: Oral Oral Oral Oral  SpO2: 100% 97% 95% 100%  Weight:      Height:        Intake/Output Summary (Last 24 hours) at 05/27/2021 1253 Last data filed at 05/27/2021 1000 Gross per 24 hour  Intake 6318.67 ml  Output 800 ml  Net 5518.67 ml   Filed Weights   05/26/21 1349  Weight: 89 kg    Examination: Condom cath in place with clear urine  General exam: Appears in mild distress due to dysphagia Respiratory system: Clear to auscultation. Respiratory effort normal. Cardiovascular system: S1 & S2 heard, RRR. No JVD, murmurs, rubs, gallops or clicks. No pedal edema. Gastrointestinal system: Abdomen is nondistended, soft and nontender. No organomegaly or masses felt. Normal bowel sounds heard. Central nervous system: Awake alert oriented paraplegic extremities: 2+ pitting edema chronic Skin: No rashes, lesions or ulcers Psychiatry: Judgement and insight appear normal. Mood & affect appropriate.     Data Reviewed: I have personally reviewed following labs and imaging studies  CBC: Recent Labs  Lab 05/26/21 1403 05/26/21 1715 05/26/21 1839 05/27/21 0428  WBC 3.4* 2.9*  --  3.6*  NEUTROABS 2.6  --   --   --   HGB 7.5* 6.4* 6.9* 7.8*  HCT 25.0* 21.4* 23.3* 25.5*  MCV 102.0* 102.4*  --  99.2  PLT 127* 101*  --  413*   Basic Metabolic Panel: Recent Labs  Lab 05/26/21 1403 05/26/21 1715 05/27/21 0428  NA 144  --  142  K 4.2  --  3.5  CL 105  --  107  CO2 28  --  27  GLUCOSE 103*  --  96  BUN 73*  --  61*  CREATININE 3.27* 2.84* 2.34*  CALCIUM 11.5*  --  10.5*   GFR: Estimated Creatinine Clearance: 33.2 mL/min (A) (by C-G formula based on SCr of 2.34 mg/dL (H)). Liver Function Tests: Recent Labs  Lab 05/26/21 1403 05/27/21 0428  AST 40 37  ALT 12 11  ALKPHOS 697* 514*  BILITOT 1.2 1.5*  PROT 6.3* 5.1*   ALBUMIN 3.3* 2.4*   No results for input(s): LIPASE, AMYLASE in the last 168 hours. No results for input(s): AMMONIA in the last 168 hours. Coagulation Profile: No results for input(s): INR, PROTIME in the last 168 hours. Cardiac Enzymes: No results for input(s): CKTOTAL, CKMB, CKMBINDEX, TROPONINI in the last 168 hours. BNP (last 3 results) No results for input(s): PROBNP in the last 8760 hours. HbA1C: No results for input(s): HGBA1C in the last 72 hours. CBG: No results for input(s): GLUCAP in the last 168 hours. Lipid Profile: No results for input(s): CHOL, HDL, LDLCALC, TRIG, CHOLHDL, LDLDIRECT in the last 72 hours. Thyroid Function Tests: No results for input(s): TSH, T4TOTAL, FREET4, T3FREE, THYROIDAB in the last 72 hours. Anemia Panel: No results for input(s): VITAMINB12,  FOLATE, FERRITIN, TIBC, IRON, RETICCTPCT in the last 72 hours. Sepsis Labs: Recent Labs  Lab 05/26/21 1520  LATICACIDVEN 1.2    Recent Results (from the past 240 hour(s))  Culture, blood (Routine X 2) w Reflex to ID Panel     Status: None (Preliminary result)   Collection Time: 05/26/21  2:18 PM   Specimen: BLOOD  Result Value Ref Range Status   Specimen Description   Final    BLOOD RIGHT ANTECUBITAL Performed at [facility] [address] [place], [place] [address]    Special Requests   Final    BOTTLES DRAWN AEROBIC AND ANAEROBIC Blood Culture results may not be optimal due to an excessive volume of blood received in culture bottles Performed at [facility] [address] [place], [place] [address]    Culture   Final    NO GROWTH < 24 HOURS Performed at [facility] [address], [place], [place] [address]    Report Status PENDING  Incomplete  Culture, blood (Routine X 2) w Reflex to ID Panel     Status: None (Preliminary result)   Collection Time: 05/26/21  2:23 PM   Specimen: BLOOD  Result Value Ref Range Status   Specimen Description    Final    BLOOD LEFT ANTECUBITAL Performed at [facility] [address] [place], [place] [address]    Special Requests   Final    BOTTLES DRAWN AEROBIC AND ANAEROBIC Blood Culture adequate volume Performed at [facility] [address] [place], [place] [address]    Culture   Final    NO GROWTH < 24 HOURS Performed at [facility] [address], [place],  [address]    Report Status PENDING  Incomplete  Resp Panel by RT-PCR (Flu A&B, Covid) Nasopharyngeal Swab     Status: None   Collection Time: 05/26/21  3:30 PM   Specimen: Nasopharyngeal Swab; Nasopharyngeal(NP) swabs in vial transport medium  Result Value Ref Range Status   SARS Coronavirus 2 by RT PCR NEGATIVE NEGATIVE Final    Comment: (NOTE) SARS-CoV-2 target nucleic acids are NOT DETECTED.  The SARS-CoV-2 RNA is generally detectable in upper respiratory specimens during the acute phase of infection. The lowest concentration of SARS-CoV-2 viral copies this assay can detect is 138 copies/mL. A negative result does not preclude SARS-Cov-2 infection and should not be used as the sole basis for treatment or other patient management decisions. A negative result may occur with  improper specimen collection/handling, submission of specimen other than nasopharyngeal swab, presence of viral mutation(s) within the areas targeted by this assay, and inadequate number of viral copies(<138 copies/mL). A negative result must be combined with clinical observations, patient history, and epidemiological information. The expected result is Negative.  Fact Sheet for Patients:  EntrepreneurPulse.com.au  Fact Sheet for Healthcare Providers:  IncredibleEmployment.be  This test is no t yet approved or cleared by the [place] FDA and  has been authorized for detection and/or diagnosis of SARS-CoV-2 by FDA under an Emergency Use Authorization  (EUA). This EUA will remain  in effect (meaning this test can be used) for the duration of the COVID-19 declaration under Section 564(b)(1) of the Act, 21 U.S.C.section 360bbb-3(b)(1), unless the authorization is terminated  or revoked sooner.       Influenza A by PCR NEGATIVE NEGATIVE Final   Influenza B by PCR NEGATIVE NEGATIVE Final    Comment: (NOTE) The Xpert  Xpress SARS-CoV-2/FLU/RSV plus assay is intended as an aid in the diagnosis of influenza from Nasopharyngeal swab specimens and should not be used as a sole basis for treatment. Nasal washings and aspirates are unacceptable for Xpert Xpress SARS-CoV-2/FLU/RSV testing.  Fact Sheet for Patients: EntrepreneurPulse.com.au  Fact Sheet for Healthcare Providers: IncredibleEmployment.be  This test is not yet approved or cleared by the [place] FDA and has been authorized for detection and/or diagnosis of SARS-CoV-2 by FDA under an Emergency Use Authorization (EUA). This EUA will remain in effect (meaning this test can be used) for the duration of the COVID-19 declaration under Section 564(b)(1) of the Act, 21 U.S.C. section 360bbb-3(b)(1), unless the authorization is terminated or revoked.  Performed at [facility], [place] [address] [place], [place] [address]          Radiology Studies: [facility] Chest Port 1 View  Result Date: 05/26/2021 CLINICAL DATA:  History of prostate cancer. EXAM: PORTABLE CHEST 1 VIEW COMPARISON:  Weakness, increased weakness.  Diminished appetite. FINDINGS: Lung volumes are diminished and image slightly rotated. Accounting for this cardiomediastinal contours and hilar structures are stable. Increased opacity in the RIGHT mid chest and RIGHT lower lobe since previous imaging. Sclerotic lesions in the bilateral ribs are again noted. EKG leads project over the chest. IMPRESSION: Areas of increased opacity in the RIGHT lower chest in particular,  raising the question of airspace disease/infection. Multifocal sclerotic rib lesions in this patient with known prostate cancer metastases. Electronically Signed   By: Zetta Bills M.D.   On: 05/26/2021 15:06   US Abdomen Limited RUQ (LIVER/GB)  Result Date: 05/26/2021 CLINICAL DATA:  Elevated creatinine EXAM: ULTRASOUND ABDOMEN LIMITED RIGHT UPPER QUADRANT COMPARISON:  CT from 12/13/2020 FINDINGS: Gallbladder: Gallbladder is well distended with gallbladder sludge. No stones are identified. No wall thickening is seen. Common bile duct: Diameter: 6.7 mm which is within normal limits for the patient's given age. Liver: No focal lesion identified. Within normal limits in parenchymal echogenicity. Portal vein is patent on color Doppler imaging with normal direction of blood flow towards the liver. Other: None. IMPRESSION: Gallbladder sludge without acute complicating factors. No other focal abnormality is noted. Electronically Signed   By: Inez Catalina M.D.   On: 05/26/2021 19:41        Scheduled Meds:  amLODipine  7.5 mg Oral Daily   heparin  5,000 Units Subcutaneous Q8H   magic mouthwash w/lidocaine  5 mL Oral QID   Continuous Infusions:  sodium chloride 150 mL/hr at 05/27/21 1242   lactated ringers     [START ON 05/28/2021] levofloxacin (LEVAQUIN) IV       LOS: 1 day    Time spent: 40 minutes    Georgette Shell, MD 05/27/2021, 12:53 PM

## 2021-05-28 DIAGNOSIS — R7989 Other specified abnormal findings of blood chemistry: Secondary | ICD-10-CM | POA: Diagnosis not present

## 2021-05-28 DIAGNOSIS — J189 Pneumonia, unspecified organism: Secondary | ICD-10-CM | POA: Diagnosis not present

## 2021-05-28 LAB — MAGNESIUM: Magnesium: 2.3 mg/dL (ref 1.7–2.4)

## 2021-05-28 LAB — COMPREHENSIVE METABOLIC PANEL
ALT: 10 U/L (ref 0–44)
AST: 36 U/L (ref 15–41)
Albumin: 2.4 g/dL — ABNORMAL LOW (ref 3.5–5.0)
Alkaline Phosphatase: 500 U/L — ABNORMAL HIGH (ref 38–126)
Anion gap: 8 (ref 5–15)
BUN: 45 mg/dL — ABNORMAL HIGH (ref 8–23)
CO2: 23 mmol/L (ref 22–32)
Calcium: 9.9 mg/dL (ref 8.9–10.3)
Chloride: 113 mmol/L — ABNORMAL HIGH (ref 98–111)
Creatinine, Ser: 1.76 mg/dL — ABNORMAL HIGH (ref 0.61–1.24)
GFR, Estimated: 42 mL/min — ABNORMAL LOW (ref >60)
Glucose, Bld: 87 mg/dL (ref 70–99)
Potassium: 2.8 mmol/L — ABNORMAL LOW (ref 3.5–5.1)
Sodium: 144 mmol/L (ref 135–145)
Total Bilirubin: 1 mg/dL (ref 0.3–1.2)
Total Protein: 4.8 g/dL — ABNORMAL LOW (ref 6.5–8.1)

## 2021-05-28 LAB — CBC
HCT: 27.3 % — ABNORMAL LOW (ref 39.0–52.0)
Hemoglobin: 8.2 g/dL — ABNORMAL LOW (ref 13.0–17.0)
MCH: 30.3 pg (ref 26.0–34.0)
MCHC: 30 g/dL (ref 30.0–36.0)
MCV: 100.7 fL — ABNORMAL HIGH (ref 80.0–100.0)
Platelets: 95 10*3/uL — ABNORMAL LOW (ref 150–400)
RBC: 2.71 MIL/uL — ABNORMAL LOW (ref 4.22–5.81)
RDW: 19.2 % — ABNORMAL HIGH (ref 11.5–15.5)
WBC: 3.8 10*3/uL — ABNORMAL LOW (ref 4.0–10.5)
nRBC: 0 % (ref 0.0–0.2)

## 2021-05-28 MED ORDER — ADULT MULTIVITAMIN W/MINERALS CH
1.0000 | ORAL_TABLET | Freq: Every day | ORAL | Status: DC
Start: 2021-05-28 — End: 2021-06-09
  Administered 2021-05-28 – 2021-06-09 (×13): 1 via ORAL
  Filled 2021-05-28 (×13): qty 1

## 2021-05-28 MED ORDER — PROSOURCE PLUS PO LIQD
30.0000 mL | Freq: Two times a day (BID) | ORAL | Status: DC
  Administered 2021-05-28 – 2021-06-04 (×6): 30 mL via ORAL
  Filled 2021-05-28 (×9): qty 30

## 2021-05-28 MED ORDER — APIXABAN 5 MG PO TABS
5.0000 mg | ORAL_TABLET | Freq: Two times a day (BID) | ORAL | Status: DC
  Administered 2021-05-28 – 2021-06-09 (×24): 5 mg via ORAL
  Filled 2021-05-28 (×24): qty 1

## 2021-05-28 MED ORDER — SUCRALFATE 1 GM/10ML PO SUSP
1.0000 g | Freq: Three times a day (TID) | ORAL | Status: DC
  Administered 2021-05-28 – 2021-05-29 (×4): 1 g via ORAL
  Filled 2021-05-28 (×9): qty 10

## 2021-05-28 MED ORDER — POTASSIUM CHLORIDE 10 MEQ/100ML IV SOLN
10.0000 meq | INTRAVENOUS | Status: AC
  Administered 2021-05-28 (×4): 10 meq via INTRAVENOUS
  Filled 2021-05-28: qty 100

## 2021-05-28 MED ORDER — ENSURE ENLIVE PO LIQD
237.0000 mL | Freq: Two times a day (BID) | ORAL | Status: DC
  Administered 2021-05-28 – 2021-05-29 (×3): 237 mL via ORAL

## 2021-05-28 NOTE — Progress Notes (Signed)
Initial Nutrition Assessment RD working remotely.  DOCUMENTATION CODES:   Not applicable  INTERVENTION:  - will order Ensure Enlive BID, each supplement provides 350 kcal and 20 grams of protein. - will order 30 ml Prosource Plus BID, each supplement provides 100 kcal and 15 grams protein.  - will order 1 tablet multivitamin with minerals/day. - complete NFPE when feasible.    NUTRITION DIAGNOSIS:   Inadequate oral intake related to dysphagia, mouth pain as evidenced by per patient/family report.  GOAL:   Patient will meet greater than or equal to 90% of their needs  MONITOR:   PO intake, Supplement acceptance, Labs, Weight trends  REASON FOR ASSESSMENT:   Malnutrition Screening Tool  ASSESSMENT:   70 y.o. male with medical history of HTN, depression, paraplegia, and metastatic prostate cancer with mets to the bones, finished XRT ~1 week ago. He lives at home with his daughter and is followed outpatient by Palliative Care. In the ED, daughter expressed concern about burns from XRT to esophageal area and that patient is having difficulty swallowing. At home, lidocaine solution has been somewhat helpful. Patient reported generalized weakness and abdominal pain.  Patient has not been seen by a [facility] RD at any time in the past. Heart Healthy diet ordered on 11/4 at 1714 and changed to Dysphagia 2, thin liquids with Heart Healthy restriction yesterday at 1448. He consumed 50% of breakfast, 50% of lunch, and 25% of dinner yesterday.   Weight on 11/4 was 196 lb and weight has been stable over the past 13.5 months, per review of weights recorded in the chart. Mild pitting edema to BLE documented in the edema section of flow sheet.   Patient has delayed responses. He has had difficulty mainly with solid foods and prefers liquids and things that require minimal to no chewing. Unsure of time frame for when this began. Swallowing has been painful for an unknown time frame.   SLP saw  patient yesterday and recommended current diet order: dysphagia 2, thin liquids.   Per notes: - CAP/HCAP for patient in and out of health care facilities for several weeks - AKI 2/2 decreased oral intake - hypercalcemia - prostate cancer with mets to bones, including spine--follows at [facility], s/p chemo and XRT - dysphagia thought to be 2/2 XRT-induced esophagitis - paraplegia 2/2 spinal mets, able to wiggle toes   Labs reviewed; BUN: 61 mg/dl, creatinine: 2.34 mg/dl, Ca: 10.5 mg/dl, Alk Phos significantly elevated but trending down, GFR: 30 ml/min.  Medications reviewed; magic mouthwash with lidocaine QID.  IVF; NS @ 150 ml/hr.     NUTRITION - FOCUSED PHYSICAL EXAM:  Unable to complete at this time.  Diet Order:   Diet Order             DIET DYS 2 Room service appropriate? Yes with Assist; Fluid consistency: Thin  Diet effective now                   EDUCATION NEEDS:   Not appropriate for education at this time  Skin:  Skin Assessment: Skin Integrity Issues: Skin Integrity Issues:: Other (Comment) Other: wound to vertebral column  Last BM:  PTA/unknown  Height:   Ht Readings from Last 1 Encounters:  05/26/21 6' (1.829 m)    Weight:   Wt Readings from Last 1 Encounters:  05/26/21 89 kg    Estimated Nutritional Needs:  Kcal:  2125-2350 kcal Protein:  105-120 grams Fluid:  >/= 2.5 L/day  Danh Bayus, MS, RD, LDN, CNSC Inpatient Clinical Dietitian RD pager # available in AMION  After hours/weekend pager # available in AMION  

## 2021-05-28 NOTE — Progress Notes (Addendum)
PROGRESS NOTE    Terry Mitchell  OJJ:[id number] DOB: 13-Jan-1951 DOA: 05/26/2021 PCP: [name], MD  Brief Narrative: Terry Mitchell is a 70 y.o. male with medical history significant of metastatic prostate cancer with mets to the bones.  He had finished his radiation therapy approximately a week ago.  History obtained from patient's daughter.  Patient also followed by a thorough care palliative care.  And he is a DNR.  He lives at home with his daughter Terry Mitchell.   He has metastatic prostate cancer with mets to the spine status post chemo and radiation.  Daughter concerned that his esophagus is burnt from radiation and he is having difficulty swallowing food or drinking liquids.  They have been using lidocaine solution which has been helping some however patient continues with not eating or drinking and losing weight with generalized weakness.  He has a history of paraplegia and he is unable to walk  Patient complains of abdominal pain he cannot explain more than that to me.  The daughter is not able to tell me anything more than that.  Patient has had no nausea vomiting or diarrhea.  She is not sure when his last bowel movement was.  She also reports that he has been more confused at home.   ED Course: Vital signs in the ED blood pressure 142/82 pulse is 103 respiration 20 temperature 97.8 saturation 98% on room air CBG was 137.  Assessment & Plan:   Active Problems:   CAP (community acquired pneumonia)   Elevated serum creatinine      #1 community-acquired pneumonia/hospital-acquired pneumonia patient has been in and out of hospitals and rehabs for few weeks to months.  He is admitted with a cough generalized weakness shortness of breath decreased p.o. intake.   He was not hypoxic in the ER Chest x-ray shows right sided infiltrates consistent with consolidation Patient is allergic to cephalosporin Started Levaquin 750 mg every 48 hours to adjust for renal function.    #2 AKI  with hypokalemia secondary to decreased p.o. intake and dehydration.  Creatinine 1.76 from 2.34 from 3.27 on admission.  Baseline creatinine is around 1.3-1.4.  Continue IV fluids.Follow-up BMP in a.m.   #3 hypercalcemia due to dehydration resolved.   #4  Anemia/pancytopenia improving due to recent chemo likely monitor daily.  Hemoglobin was 6.4 and a stat repeat hemoglobin came back 6.9 he received 1 unit of packed RBC.  FOBT is pending.  Hemoglobin 8.2 today after 1 unit of transfusion.   #5 elevated alkaline phosphatase due to bony mets   #6 prostate cancer with mets to the bones and spine followed at [facility] he is status postradiation and chemo   #7 dysphagia likely related to radiation induced esophagitis consult speech therapy and lidocaine miracle mouthwash   #8 abdominal pain check abdominal ultrasound will consider CT of the abdomen and pelvis once creatinine improves  #9 paraplegia secondary to spinal mets-MRI of the thoracic spine in May 2022-diffuse osseous metastatic disease with largest lesion at T6, circumferential epidural extension of tumor at T6-T7 with compression of the spinal cord. MRI of the lumbar spine in May 2022-diffuse lumbar osseous metastatic disease without pathologic fracture.  No spinal canal or neural foraminal stenosis of the upper lumbar spine.  #10 discharge plans for 05/29/2021.  Daughter reports she does not have DME needed to take care of her father at home.  TOC consulted.  #10 hypokalemia potassium is 2.8.  Replete.  Check mag.  Estimated body mass index is 26.61 kg/m as calculated from the following:   Height as of this encounter: 6' (1.829 m).   Weight as of this encounter: 89 kg.  DVT prophylaxis: SCD  code Status: DNR  family Communication: Discussed with daughter Disposition Plan:  Status is: Inpatient  Remains inpatient appropriate because: AKI dehydration, pancytopenia metastatic prostate cancer   Consultants:   None  Procedures: None Antimicrobials: Levofloxacin  Subjective:  Patient is in bed with head end of the bed elevated trying to eat breakfast. He has to take small bites and swallow slowly due to odynophagia Daughter concerned about taking him home due to lack of DME TOC consulted Condom cath in place draining clear urine Objective: Vitals:   05/27/21 1345 05/27/21 1838 05/27/21 2120 05/28/21 0541  BP: 117/69 134/80 133/74 133/78  Pulse: 88 98 95 96  Resp: 15 14 17 18   Temp: 98.6 F (37 C) 98.5 F (36.9 C) 98.2 F (36.8 C) 98.4 F (36.9 C)  TempSrc: Oral Oral Oral Oral  SpO2: 98% 94% 99% 96%  Weight:      Height:        Intake/Output Summary (Last 24 hours) at 05/28/2021 1230 Last data filed at 05/28/2021 1000 Gross per 24 hour  Intake 4727.25 ml  Output 1275 ml  Net 3452.25 ml    Filed Weights   05/26/21 1349  Weight: 89 kg    Examination: Condom cath in place with clear urine  General exam: Appears in mild distress due to dysphagia Respiratory system: Clear to auscultation. Respiratory effort normal. Cardiovascular system: S1 & S2 heard, RRR. No JVD, murmurs, rubs, gallops or clicks. No pedal edema. Gastrointestinal system: Abdomen is nondistended, soft and nontender. No organomegaly or masses felt. Normal bowel sounds heard. Central nervous system: Awake alert oriented paraplegic extremities: 2+ pitting edema chronic Skin: No rashes, lesions or ulcers Psychiatry: Judgement and insight appear normal. Mood & affect appropriate.     Data Reviewed: I have personally reviewed following labs and imaging studies  CBC: Recent Labs  Lab 05/26/21 1403 05/26/21 1715 05/26/21 1839 05/27/21 0428 05/28/21 0733  WBC 3.4* 2.9*  --  3.6* 3.8*  NEUTROABS 2.6  --   --   --   --   HGB 7.5* 6.4* 6.9* 7.8* 8.2*  HCT 25.0* 21.4* 23.3* 25.5* 27.3*  MCV 102.0* 102.4*  --  99.2 100.7*  PLT 127* 101*  --  103* 95*    Basic Metabolic Panel: Recent Labs  Lab  05/26/21 1403 05/26/21 1715 05/27/21 0428 05/28/21 0733  NA 144  --  142 144  K 4.2  --  3.5 2.8*  CL 105  --  107 113*  CO2 28  --  27 23  GLUCOSE 103*  --  96 87  BUN 73*  --  61* 45*  CREATININE 3.27* 2.84* 2.34* 1.76*  CALCIUM 11.5*  --  10.5* 9.9    GFR: Estimated Creatinine Clearance: 44.1 mL/min (A) (by C-G formula based on SCr of 1.76 mg/dL (H)). Liver Function Tests: Recent Labs  Lab 05/26/21 1403 05/27/21 0428 05/28/21 0733  AST 40 37 36  ALT 12 11 10   ALKPHOS 697* 514* 500*  BILITOT 1.2 1.5* 1.0  PROT 6.3* 5.1* 4.8*  ALBUMIN 3.3* 2.4* 2.4*    No results for input(s): LIPASE, AMYLASE in the last 168 hours. No results for input(s): AMMONIA in the last 168 hours. Coagulation Profile: No results for input(s): INR, PROTIME in the last 168 hours.  Cardiac Enzymes: No results for input(s): CKTOTAL, CKMB, CKMBINDEX, TROPONINI in the last 168 hours. BNP (last 3 results) No results for input(s): PROBNP in the last 8760 hours. HbA1C: No results for input(s): HGBA1C in the last 72 hours. CBG: No results for input(s): GLUCAP in the last 168 hours. Lipid Profile: No results for input(s): CHOL, HDL, LDLCALC, TRIG, CHOLHDL, LDLDIRECT in the last 72 hours. Thyroid Function Tests: No results for input(s): TSH, T4TOTAL, FREET4, T3FREE, THYROIDAB in the last 72 hours. Anemia Panel: No results for input(s): VITAMINB12, FOLATE, FERRITIN, TIBC, IRON, RETICCTPCT in the last 72 hours. Sepsis Labs: Recent Labs  Lab 05/26/21 1520  LATICACIDVEN 1.2     Recent Results (from the past 240 hour(s))  Culture, blood (Routine X 2) w Reflex to ID Panel     Status: None (Preliminary result)   Collection Time: 05/26/21  2:18 PM   Specimen: BLOOD  Result Value Ref Range Status   Specimen Description   Final    BLOOD RIGHT ANTECUBITAL Performed at [facility] [address] [place], [place] [address]    Special Requests   Final    BOTTLES DRAWN AEROBIC AND  ANAEROBIC Blood Culture results may not be optimal due to an excessive volume of blood received in culture bottles Performed at [facility] [address] [place], [place] [address]    Culture   Final    NO GROWTH 2 DAYS Performed at [facility] [address] [place], [place] [address]    Report Status PENDING  Incomplete  Culture, blood (Routine X 2) w Reflex to ID Panel     Status: None (Preliminary result)   Collection Time: 05/26/21  2:23 PM   Specimen: BLOOD  Result Value Ref Range Status   Specimen Description   Final    BLOOD LEFT ANTECUBITAL Performed at [facility] [address], [place], [place] [address]    Special Requests   Final    BOTTLES DRAWN AEROBIC AND ANAEROBIC Blood Culture adequate volume Performed at [facility] [address], [place],  [address]    Culture   Final    NO GROWTH 2 DAYS Performed at [facility] [address] [place],  [address]    Report Status PENDING  Incomplete  Resp Panel by RT-PCR (Flu A&B, Covid) Nasopharyngeal Swab     Status: None   Collection Time: 05/26/21  3:30 PM   Specimen: Nasopharyngeal Swab; Nasopharyngeal(NP) swabs in vial transport medium  Result Value Ref Range Status   SARS Coronavirus 2 by RT PCR NEGATIVE NEGATIVE Final    Comment: (NOTE) SARS-CoV-2 target nucleic acids are NOT DETECTED.  The SARS-CoV-2 RNA is generally detectable in upper respiratory specimens during the acute phase of infection. The lowest concentration of SARS-CoV-2 viral copies this assay can detect is 138 copies/mL. A negative result does not preclude SARS-Cov-2 infection and should not be used as the sole basis for treatment or other patient management decisions. A negative result may occur with  improper specimen collection/handling, submission of specimen other than nasopharyngeal swab, presence of viral mutation(s) within the areas targeted by  this assay, and inadequate number of viral copies(<138 copies/mL). A negative result must be combined with clinical observations, patient history, and epidemiological information. The expected result is Negative.  Fact Sheet for Patients:  EntrepreneurPulse.com.au  Fact Sheet for Healthcare Providers:  IncredibleEmployment.be  This test is no t yet approved or cleared  by the [place] and  has been authorized for detection and/or diagnosis of SARS-CoV-2 by FDA under an Emergency Use Authorization (EUA). This EUA will remain  in effect (meaning this test can be used) for the duration of the COVID-19 declaration under Section 564(b)(1) of the Act, 21 U.S.C.section 360bbb-3(b)(1), unless the authorization is terminated  or revoked sooner.       Influenza A by PCR NEGATIVE NEGATIVE Final   Influenza B by PCR NEGATIVE NEGATIVE Final    Comment: (NOTE) The Xpert Xpress SARS-CoV-2/FLU/RSV plus assay is intended as an aid in the diagnosis of influenza from Nasopharyngeal swab specimens and should not be used as a sole basis for treatment. Nasal washings and aspirates are unacceptable for Xpert Xpress SARS-CoV-2/FLU/RSV testing.  Fact Sheet for Patients: EntrepreneurPulse.com.au  Fact Sheet for Healthcare Providers: IncredibleEmployment.be  This test is not yet approved or cleared by the [place] FDA and has been authorized for detection and/or diagnosis of SARS-CoV-2 by FDA under an Emergency Use Authorization (EUA). This EUA will remain in effect (meaning this test can be used) for the duration of the COVID-19 declaration under Section 564(b)(1) of the Act, 21 U.S.C. section 360bbb-3(b)(1), unless the authorization is terminated or revoked.  Performed at [facility], [place] [address] [place], [place] [address]           Radiology Studies: [facility] Chest Port 1 View  Result  Date: 05/26/2021 CLINICAL DATA:  History of prostate cancer. EXAM: PORTABLE CHEST 1 VIEW COMPARISON:  Weakness, increased weakness.  Diminished appetite. FINDINGS: Lung volumes are diminished and image slightly rotated. Accounting for this cardiomediastinal contours and hilar structures are stable. Increased opacity in the RIGHT mid chest and RIGHT lower lobe since previous imaging. Sclerotic lesions in the bilateral ribs are again noted. EKG leads project over the chest. IMPRESSION: Areas of increased opacity in the RIGHT lower chest in particular, raising the question of airspace disease/infection. Multifocal sclerotic rib lesions in this patient with known prostate cancer metastases. Electronically Signed   By: Zetta Bills M.D.   On: 05/26/2021 15:06   US Abdomen Limited RUQ (LIVER/GB)  Result Date: 05/26/2021 CLINICAL DATA:  Elevated creatinine EXAM: ULTRASOUND ABDOMEN LIMITED RIGHT UPPER QUADRANT COMPARISON:  CT from 12/13/2020 FINDINGS: Gallbladder: Gallbladder is well distended with gallbladder sludge. No stones are identified. No wall thickening is seen. Common bile duct: Diameter: 6.7 mm which is within normal limits for the patient's given age. Liver: No focal lesion identified. Within normal limits in parenchymal echogenicity. Portal vein is patent on color Doppler imaging with normal direction of blood flow towards the liver. Other: None. IMPRESSION: Gallbladder sludge without acute complicating factors. No other focal abnormality is noted. Electronically Signed   By: Inez Catalina M.D.   On: 05/26/2021 19:41        Scheduled Meds:  (feeding supplement) PROSource Plus  30 mL Oral BID BM   amLODipine  7.5 mg Oral Daily   feeding supplement  237 mL Oral BID BM   heparin  5,000 Units Subcutaneous Q8H   magic mouthwash w/lidocaine  5 mL Oral QID   multivitamin with minerals  1 tablet Oral Daily   Continuous Infusions:  sodium chloride 150 mL/hr at 05/28/21 0615   lactated ringers      levofloxacin (LEVAQUIN) IV 750 mg (05/28/21 1108)     LOS: 2 days    Time spent: 40 minutes    Georgette Shell, MD 05/28/2021, 12:30 PM

## 2021-05-29 ENCOUNTER — Inpatient Hospital Stay (HOSPITAL_COMMUNITY): Payer: Medicare HMO

## 2021-05-29 DIAGNOSIS — J189 Pneumonia, unspecified organism: Secondary | ICD-10-CM | POA: Diagnosis not present

## 2021-05-29 LAB — BASIC METABOLIC PANEL
Anion gap: 10 (ref 5–15)
Anion gap: 5 (ref 5–15)
BUN: 35 mg/dL — ABNORMAL HIGH (ref 8–23)
BUN: 35 mg/dL — ABNORMAL HIGH (ref 8–23)
CO2: 23 mmol/L (ref 22–32)
CO2: 24 mmol/L (ref 22–32)
Calcium: 9.8 mg/dL (ref 8.9–10.3)
Calcium: 9.5 mg/dL (ref 8.9–10.3)
Chloride: 111 mmol/L (ref 98–111)
Chloride: 114 mmol/L — ABNORMAL HIGH (ref 98–111)
Creatinine, Ser: 1.4 mg/dL — ABNORMAL HIGH (ref 0.61–1.24)
Creatinine, Ser: 1.3 mg/dL — ABNORMAL HIGH (ref 0.61–1.24)
GFR, Estimated: 55 mL/min — ABNORMAL LOW (ref >60)
GFR, Estimated: 60 mL/min — ABNORMAL LOW (ref >60)
Glucose, Bld: 113 mg/dL — ABNORMAL HIGH (ref 70–99)
Glucose, Bld: 111 mg/dL — ABNORMAL HIGH (ref 70–99)
Potassium: 3 mmol/L — ABNORMAL LOW (ref 3.5–5.1)
Potassium: 2.7 mmol/L — CL (ref 3.5–5.1)
Sodium: 144 mmol/L (ref 135–145)
Sodium: 143 mmol/L (ref 135–145)

## 2021-05-29 IMAGING — MR MR THORACIC SPINE WO/W CM
7 of 9 series · 32 of 48 positions shown · IV contrast (gadavist)
Comparison: Previous MRI from [DATE].

CLINICAL DATA: Initial evaluation for metastatic prostate cancer,
status post chemo radiation.

EXAM:
MRI THORACIC WITHOUT AND WITH CONTRAST
TECHNIQUE: Multiplanar and multiecho pulse sequences of the thoracic spine were
obtained without and with intravenous contrast.
CONTRAST:  10mL GADAVIST GADOBUTROL 1 MMOL/ML IV SOLN

[Series 16: T1 · sagittal · 4.0mm · 1.72mm/px · 1 of 5 slices shown (1 of 3)]
[im 1/5]
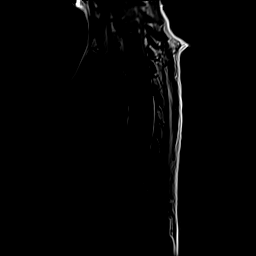

[Series 38: T1 · sagittal · 3.0mm · 1.00mm/px · 3 of 17 slices shown (2 of 3)]
[im 1/17]
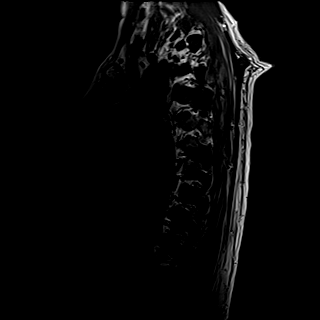
[im 9/17]
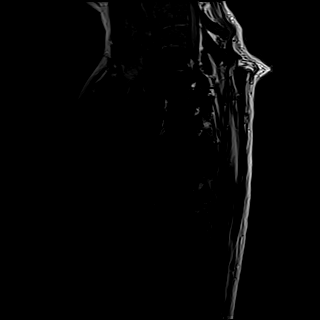
[im 17/17]
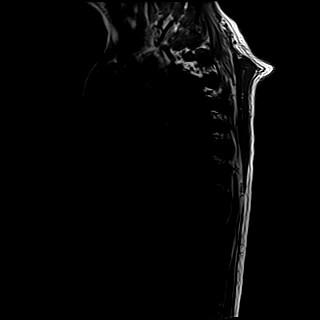

[Series 39: STIR · sagittal · 3.0mm · 1.00mm/px · 4 of 17 slices shown]
[im 1/17]
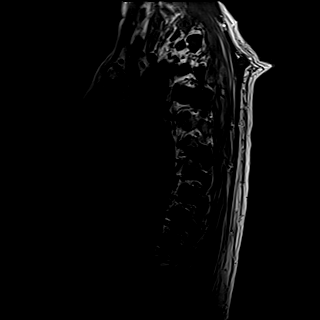
[im 6/17]
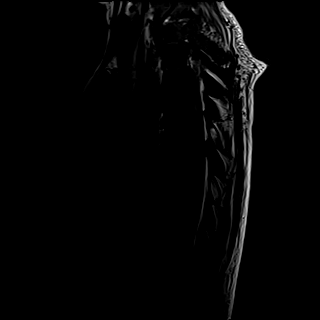
[im 11/17]
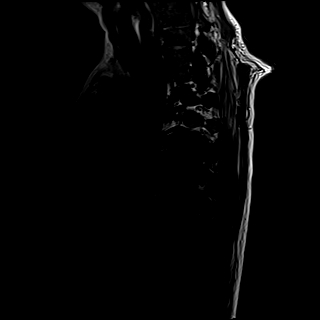
[im 17/17]
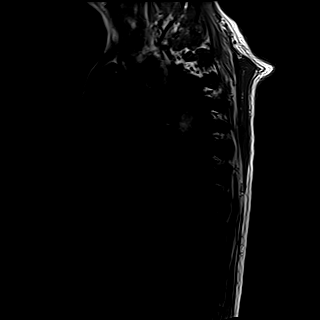

[Series 41: T2 · axial · 4.0mm · 0.78mm/px · z∈[-288,-13]mm · 8 of 39 slices shown]
[im 1/39]
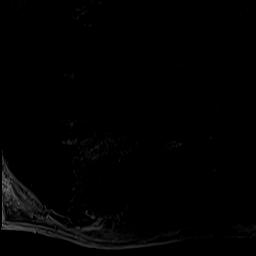
[im 6/39]
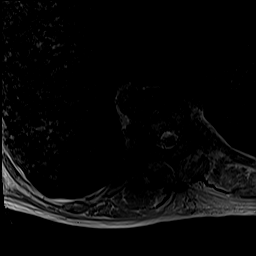
[im 11/39]
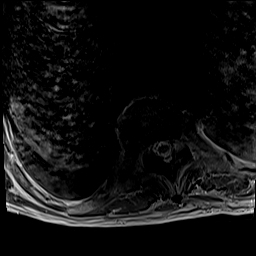
[im 17/39]
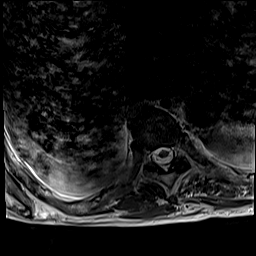
[im 22/39]
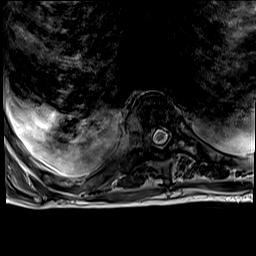
[im 28/39]
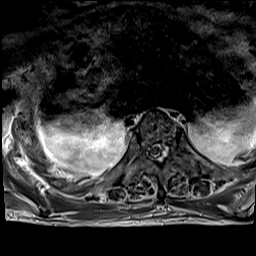
[im 33/39]
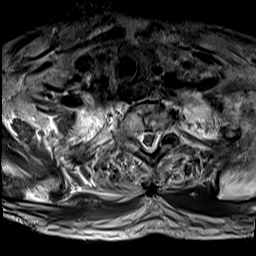
[im 39/39]
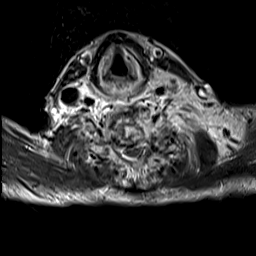

[Series 44: T1 · axial · 4.0mm · 0.39mm/px · z∈[-288,-13]mm · 8 of 39 slices shown (3 of 3)]
[im 1/39]
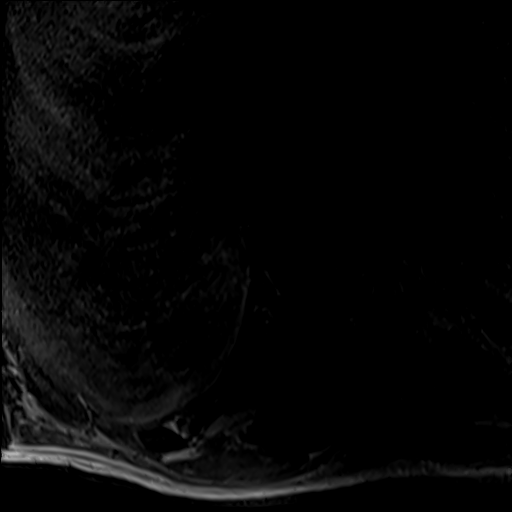
[im 6/39]
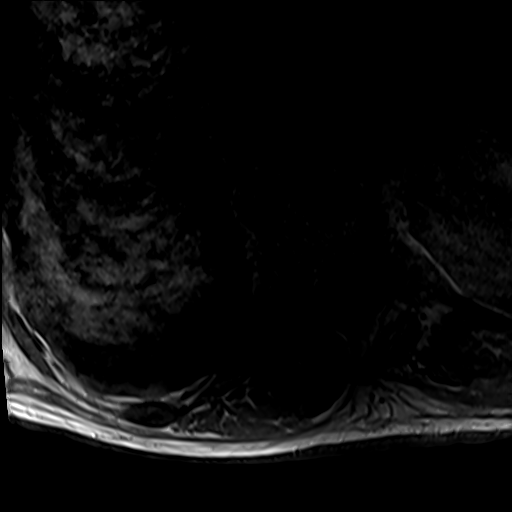
[im 11/39]
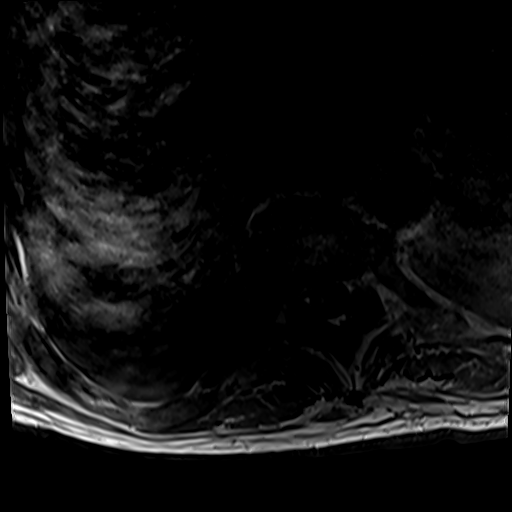
[im 17/39]
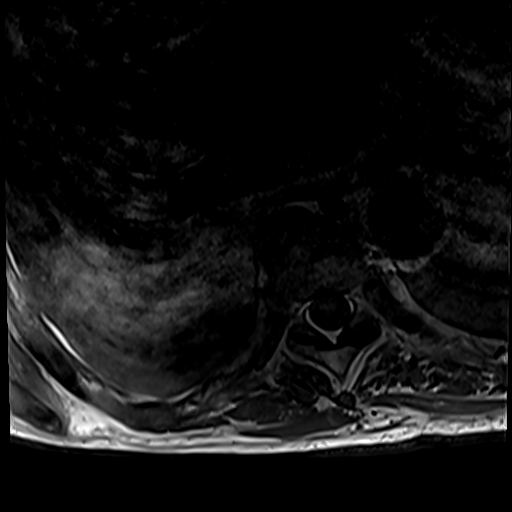
[im 22/39]
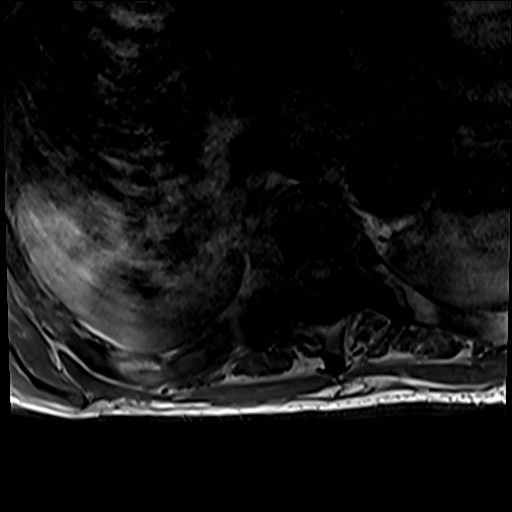
[im 28/39]
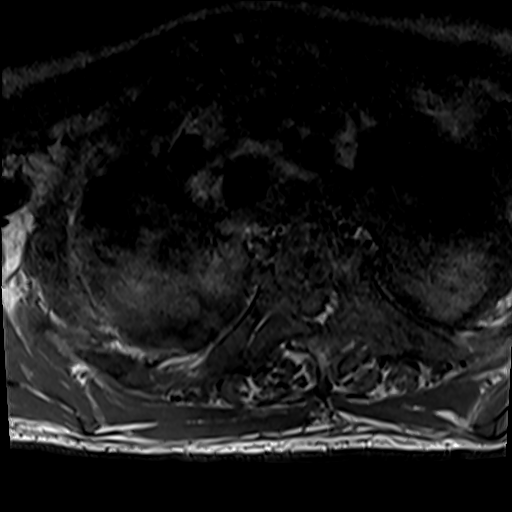
[im 33/39]
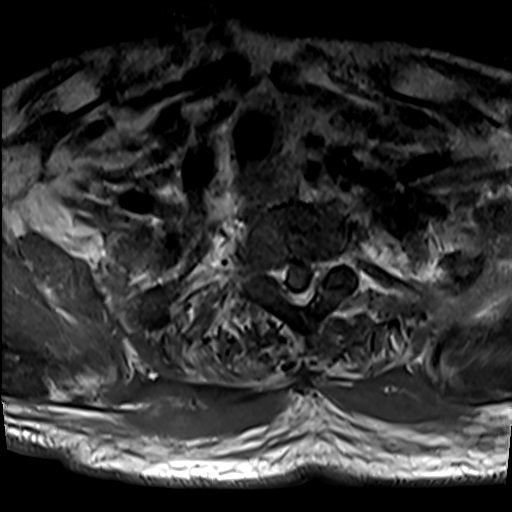
[im 39/39]
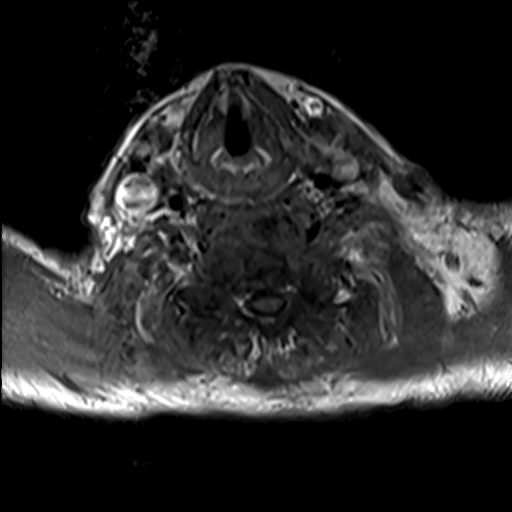

[Series 51: T2 post-contrast · sagittal · 3.0mm · 0.83mm/px · 4 of 17 slices shown]
[im 1/17]
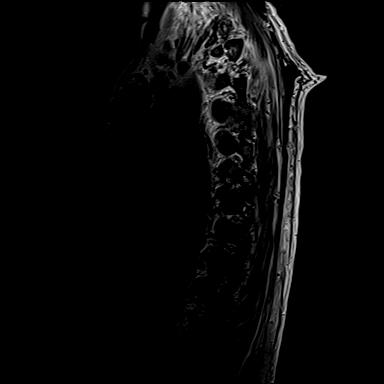
[im 6/17]
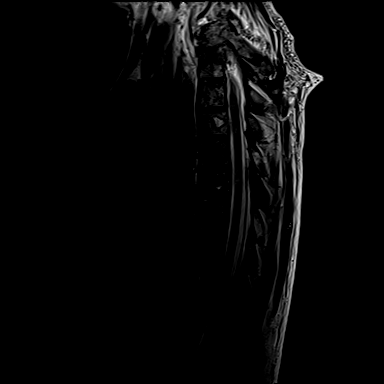
[im 11/17]
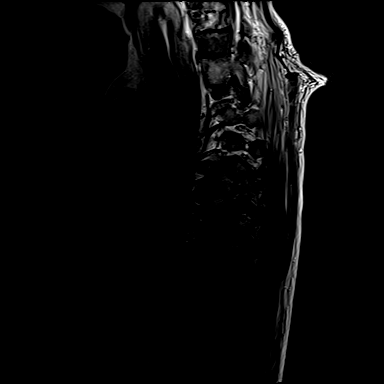
[im 17/17]
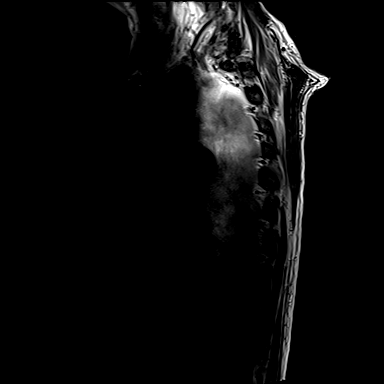

[Series 52: T1 fat-sat post-contrast · sagittal · 3.0mm · 1.00mm/px · 4 of 17 slices shown]
[im 1/17]
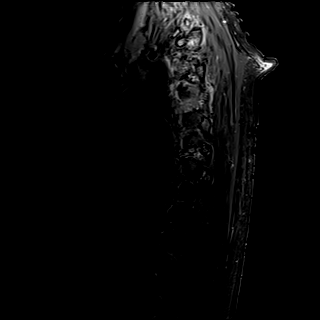
[im 6/17]
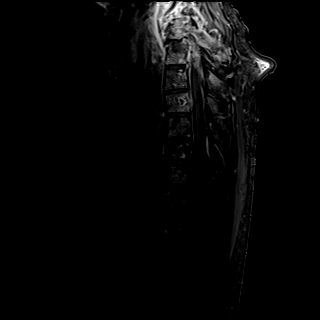
[im 11/17]
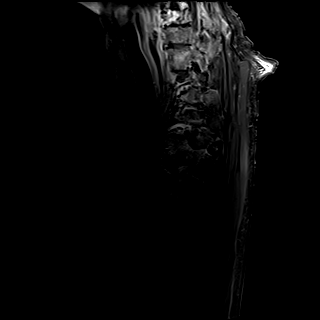
[im 17/17]
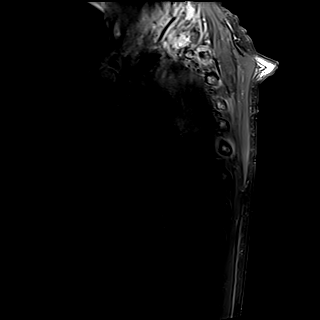

[32 of 48 positions shown; findings below may reference images not displayed]

FINDINGS: Alignment: Examination degraded by motion.

Physiologic with preservation of the normal thoracic kyphosis. No
interval listhesis or malalignment.

Vertebrae: Widespread osseous metastatic disease again seen
throughout the thoracic spine. Vertebral body height maintained
without interval pathologic fracture. Previously seen extraosseous
extension of tumor at the level of T6 is improved, with only a small
amount of residual epidural tumor now seen at this level. Mild
epidural extension into the right ventral epidural space at the
levels of T3 and T4, similar to previous. Now seen is somewhat
smooth diffuse enhancement involving both the ventral and dorsal
epidural space extending from the cervicothoracic junction through
T6 (series 52, image 11). Given the relatively smooth morphology of
this finding, this is suspected to in large part reflect post
radiation changes, although a small amount of superimposed epidural
tumor is likely present as well, most pronounced within the ventral
epidural space at the levels of T3 and T4. Progressive epidural
lipomatosis also noted within the upper thoracic spine. There is
progressive mild to moderate spinal stenosis at the level of T2-3
with minimal cord flattening, but no cord signal changes (series 41,
image 7).

Osseous expansion and/or early epidural extension of tumor into
multiple right-sided neural foramina noted as well, most pronounced
at T6-7 (series 51, image 9). Similar but less pronounced changes
seen about the left neural foramina, most pronounced at T3-4 (series
51, image 15).

Cord: Patchy T2 signal abnormality seen within the thoracic cord at
the level of T6 at site of previously seen cord compression, likely
reflecting myelomalacia (series 51, image 12). No other convincing
cord signal changes seen on this motion degraded exam. Progressive
mild to moderate spinal stenosis at the level of T2-3 as above. No
other significant stenosis seen within the thoracic spine.

Paraspinal and other soft tissues: Large layering bilateral pleural
effusions noted. Paraspinous soft tissues demonstrate no other acute
finding.

Disc levels:

Underlying mild for age spondylosis, not significantly changed or
progressed from prior. No other significant spinal stenosis within
the thoracic spine.
IMPRESSION: 1. Widespread osseous metastatic disease throughout the thoracic
spine. No interval pathologic fracture.
2. Interval improvement in previously seen epidural tumor at T6,
consistent with response to therapy. No significant residual spinal
stenosis at this level. Patchy cord signal abnormality at this level
most likely reflects myelomalacia given the prior stenosis.
3. New fairly smooth epidural enhancement extending from the
cervicothoracic junction through T6, favored to in large part
reflect post radiation changes, although there is undoubtedly a
small amount of superimposed epidural tumor at a few levels as
above. Progressive mild to moderate spinal stenosis at the level of
T2-3. Attention at follow-up recommended.
4. Large layering bilateral pleural effusions.

## 2021-05-29 MED ORDER — POTASSIUM CHLORIDE 10 MEQ/100ML IV SOLN
10.0000 meq | INTRAVENOUS | Status: AC
  Administered 2021-05-29 (×4): 10 meq via INTRAVENOUS
  Filled 2021-05-29: qty 100

## 2021-05-29 MED ORDER — HYDROMORPHONE HCL 1 MG/ML IJ SOLN
0.5000 mg | INTRAMUSCULAR | Status: AC | PRN
  Administered 2021-05-29 – 2021-05-30 (×2): 0.5 mg via INTRAVENOUS
  Filled 2021-05-29 (×2): qty 0.5

## 2021-05-29 MED ORDER — GADOBUTROL 1 MMOL/ML IV SOLN
10.0000 mL | Freq: Once | INTRAVENOUS | Status: AC | PRN
  Administered 2021-05-29: 10 mL via INTRAVENOUS

## 2021-05-29 MED ORDER — LEVOFLOXACIN IN D5W 750 MG/150ML IV SOLN
750.0000 mg | INTRAVENOUS | Status: AC
  Administered 2021-05-29 – 2021-05-31 (×3): 750 mg via INTRAVENOUS
  Filled 2021-05-29 (×3): qty 150

## 2021-05-29 NOTE — Evaluation (Addendum)
Physical Therapy Evaluation Patient Details Name: Terry Mitchell MRN: [id number] DOB: [birth year] Today's Date: 05/29/2021  History of Present Illness  Terry Mitchell is a 70 y.o. male with medical history significant of metastatic prostate cancer with mets to the bones.  He has cervical myelopathy metastatic prostate cancer with mets to the spine status post chemo and radiation   And he is a DNR.  Daughter reports. he is having difficulty swallowing food or drinking liquids. Pt.was admitted to CIR at [facility] on 05/06/2021 and discharged first week of November. Patient admitted with community-acquired pneumonia/hospital-acquired pneumonia  Clinical Impression  The patient is awake. Unable to provide information related to his function. Patient was residing with daughter.  Patient presents with significant weakness of extremities and very little function of the  LUE. The patient may benefit from SNF rehab to decrease caregiver burden. Patient was cooperative and follows simple directions. Pt admitted with above diagnosis.  Pt currently with functional limitations due to the deficits listed below (see PT Problem List). Pt will benefit from skilled PT to increase their independence and safety with mobility to allow discharge to the venue listed below.            Recommendations for follow up therapy are one component of a multi-disciplinary discharge planning process, led by the attending physician.  Recommendations may be updated based on patient status, additional functional criteria and insurance authorization.  Follow Up Recommendations Skilled nursing-short term rehab (<3 hours/day)    Assistance Recommended at Discharge Frequent or constant Supervision/Assistance  Functional Status Assessment Patient has had a recent decline in their functional status and/or demonstrates limited ability to make significant improvements in function in a reasonable and predictable amount of time  Equipment  Recommendations  None recommended by PT    Recommendations for Other Services       Precautions / Restrictions Precautions Precautions: Fall      Mobility  Bed Mobility Overal bed mobility: Needs Assistance Bed Mobility: Rolling;Supine to Sit;Sit to Supine Rolling: Mod assist   Supine to sit: HOB elevated;Mod assist Sit to supine: Max assist   General bed mobility comments: assist legs and trunk , patient able to move legs a little to bed edge, max to place legs onto bed    Transfers Overall transfer level: Needs assistance                 General transfer comment: NT, will need lift equipment    Ambulation/Gait                  Stairs            Wheelchair Mobility    Modified Rankin (Stroke Patients Only)       Balance Overall balance assessment: Needs assistance Sitting-balance support: Bilateral upper extremity supported;Feet supported Sitting balance-Leahy Scale: Fair Sitting balance - Comments: sits at midline x 5 minutes, then begins to lean posterior                                     Pertinent Vitals/Pain Pain Assessment: Faces Faces Pain Scale: Hurts a little bit Pain Location: left arm Pain Descriptors / Indicators: Discomfort Pain Intervention(s): Monitored during session    Home Living Family/patient expects to be discharged to:: Skilled nursing facility Living Arrangements: Other relatives;Children                 Additional Comments:  was living at home with daughtwer, unsure if ambulatory- Was  ambulating  in 12/2020.    Prior Function               Mobility Comments: UNsure, patient unable to state, no family       Hand Dominance        Extremity/Trunk Assessment   Upper Extremity Assessment Upper Extremity Assessment: LUE deficits/detail LUE Deficits / Details: lifts arm from bed  ~ 8 inches, unable to grip  hand    Lower Extremity Assessment Lower Extremity Assessment: RLE  deficits/detail;LLE deficits/detail RLE Deficits / Details: able to raise from bed 2 ', flexes hip and knee in supine, knee ext 3 LLE Deficits / Details: requirs assistance to flex the hip and knee, knee ext 2//5 LLE Sensation: decreased light touch       Communication   Communication: No difficulties  Cognition Arousal/Alertness: Awake/alert Behavior During Therapy: Flat affect Overall Cognitive Status: No family/caregiver present to determine baseline cognitive functioning Area of Impairment: Orientation;Attention;Memory;Safety/judgement                 Orientation Level: Place;Time Current Attention Level: Sustained Memory: Decreased short-term memory   Safety/Judgement: Decreased awareness of deficits     General Comments: states his left arm is "not right"        General Comments      Exercises     Assessment/Plan    PT Assessment Patient needs continued PT services  PT Problem List Decreased strength;Decreased mobility;Decreased range of motion;Decreased activity tolerance;Decreased cognition;Decreased balance;Impaired sensation       PT Treatment Interventions DME instruction;Therapeutic activities;Therapeutic exercise;Patient/family education;Functional mobility training    PT Goals (Current goals can be found in the Care Plan section)  Acute Rehab PT Goals PT Goal Formulation: Patient unable to participate in goal setting Time For Goal Achievement: 06/12/21 Potential to Achieve Goals: Poor    Frequency Min 2X/week   Barriers to discharge        Co-evaluation               AM-PAC PT "6 Clicks" Mobility  Outcome Measure Help needed turning from your back to your side while in a flat bed without using bedrails?: Total Help needed moving from lying on your back to sitting on the side of a flat bed without using bedrails?: Total Help needed moving to and from a bed to a chair (including a wheelchair)?: Total Help needed standing up from a  chair using your arms (e.g., wheelchair or bedside chair)?: Total Help needed to walk in hospital room?: Total Help needed climbing 3-5 steps with a railing? : Total 6 Click Score: 6    End of Session   Activity Tolerance: Patient limited by fatigue Patient left: in bed;with call bell/phone within reach;with bed alarm set Nurse Communication: Mobility status PT Visit Diagnosis: Unsteadiness on feet (R26.81);Adult, failure to thrive (R62.7)    Time: 7035-0093 PT Time Calculation (min) (ACUTE ONLY): 27 min   Charges:   PT Evaluation $PT Eval Low Complexity: 1 Low PT Treatments $Therapeutic Activity: 8-22 mins        Tresa Endo PT Acute Rehabilitation Services Pager [phone] Office [phone]   Claretha Cooper 05/29/2021, 1:41 PM

## 2021-05-29 NOTE — Progress Notes (Signed)
PHARMACY NOTE:  ANTIMICROBIAL RENAL DOSAGE ADJUSTMENT  Current antimicrobial regimen includes a mismatch between antimicrobial dosage and estimated renal function.  As per policy approved by the Pharmacy & Therapeutics and Medical Executive Committees, the antimicrobial dosage will be adjusted accordingly.  Current antimicrobial dosage:  Levaquin 750 mg iv q 48 hours  Indication: PNA  Renal Function:  Estimated Creatinine Clearance: 55.4 mL/min (A) (by C-G formula based on SCr of 1.4 mg/dL (H)). []      On intermittent HD, scheduled: []      On CRRT    Antimicrobial dosage has been changed to:  Levaquin 750 mg IV q 24 hours  Additional comments:   Thank you for allowing pharmacy to be a part of this patient's care.  Napoleon Form, [facility] 05/29/2021 11:35 AM

## 2021-05-29 NOTE — Progress Notes (Signed)
Patient is declining his oral liquid medications, he says that he "just cant tolerate them tonight."   Patient is sipping on liquids but also declining food.

## 2021-05-29 NOTE — TOC Initial Note (Signed)
Transition of Care [facility]) - Initial/Assessment Note    Patient Details  Name: Terry Mitchell MRN: [id number] Date of Birth: 06-01-1951  Transition of Care [facility]) CM/SW Contact:    Lennart Pall, LCSW Phone Number: 05/29/2021, 12:29 PM  Clinical Narrative:                 Met with pt and daughter today to introduce self/ TOC role and discuss dc planning needs.  Daughter notes pt has had significant functional decline since completion of his radiation tx course and most significantly over the past several days prior to admission.  She does not feel that she can meet his current care needs at home.  Pt and daughter are in agreement with plan to pursue SNF rehab with hopes that he can regain some overall mobility and back to functional level he was at when he dc'd from [place].  Will begin SNF bed search and insurance authorization.  Expected Discharge Plan: Skilled Nursing Facility Barriers to Discharge: Continued Medical Work up, Ship broker, SNF Pending bed offer   Patient Goals and CMS Choice Patient states their goals for this hospitalization and ongoing recovery are:: to regain strength      Expected Discharge Plan and Services Expected Discharge Plan: [place] In-house Referral: Clinical Social Work   Post Acute Care Choice: [place] Living arrangements for the past 2 months: [place]                                      Prior Living Arrangements/Services Living arrangements for the past 2 months: Single Family Home Lives with:: Adult Children Patient language and need for interpreter reviewed:: Yes Do you feel safe going back to the place where you live?: Yes      Need for Family Participation in Patient Care: Yes (Comment) Care giver support system in place?: Yes (comment)   Criminal Activity/Legal Involvement Pertinent to Current Situation/Hospitalization: No - Comment as needed  Activities of Daily  Living Home Assistive Devices/Equipment: Eyeglasses, Wheelchair ADL Screening (condition at time of admission) Patient's cognitive ability adequate to safely complete daily activities?: No Is the patient deaf or have difficulty hearing?: No Does the patient have difficulty seeing, even when wearing glasses/contacts?: No Does the patient have difficulty concentrating, remembering, or making decisions?: Yes Patient able to express need for assistance with ADLs?: Yes Does the patient have difficulty dressing or bathing?: Yes Independently performs ADLs?: No Communication: Independent Dressing (OT): Needs assistance Is this a change from baseline?: Pre-admission baseline Grooming: Needs assistance Is this a change from baseline?: Pre-admission baseline Feeding: Needs assistance Is this a change from baseline?: Pre-admission baseline Bathing: Needs assistance Is this a change from baseline?: Pre-admission baseline Toileting: Needs assistance Is this a change from baseline?: Pre-admission baseline In/Out Bed: Needs assistance Is this a change from baseline?: Pre-admission baseline Walks in Home: Dependent Is this a change from baseline?: Pre-admission baseline Does the patient have difficulty walking or climbing stairs?: Yes Weakness of Legs: Both Weakness of Arms/Hands: Both  Permission Sought/Granted Permission sought to share information with : Family Supports Permission granted to share information with : Yes, Verbal Permission Granted  Share Information with NAME: Docia Furl     Permission granted to share info w Relationship: daughter  Permission granted to share info w Contact Information: [phone]  Emotional Assessment Appearance:: Appears stated age Attitude/Demeanor/Rapport: Gracious Affect (  typically observed): Quiet Orientation: : Oriented to Self, Oriented to Place, Oriented to  Time, Oriented to Situation Alcohol / Substance Use: Not Applicable Psych  Involvement: No (comment)  Admission diagnosis:  Elevated serum creatinine [R79.89] CAP (community acquired pneumonia) [J18.9] Community acquired pneumonia, unspecified laterality [J18.9] Patient Active Problem List   Diagnosis Date Noted   Elevated serum creatinine    CAP (community acquired pneumonia) 05/26/2021   High blood pressure 01/12/2021   Thrombocytopenia ([place]) 01/12/2021   Paraplegia ([place]) 12/20/2020   Pain of metastatic malignancy 12/14/2020   Cord compression ([place]) 12/11/2020   Stage 3b chronic kidney disease ([place]) 04/06/2020   Encounter for antineoplastic chemotherapy 04/06/2020   Hot flash in male 07/27/2019   Prostate cancer metastatic to bone ([place]) 05/15/2019   Prostate cancer ([place]) 03/19/2018   Androgen deprivation therapy 03/19/2018   PSA elevation 03/19/2018   Goals of care, counseling/discussion 03/19/2018   Chest pain 02/20/2018   PCP:  [name], MD Pharmacy:   [facility] ([facility]), [place] [address] [place] ([place]) [place] [address] Phone: [phone] Fax: [phone]     Social Determinants of Health (SDOH) Interventions    Readmission Risk Interventions No flowsheet data found.

## 2021-05-29 NOTE — Progress Notes (Signed)
PROGRESS NOTE    Terry Mitchell  STM:[id number] DOB: 1951-05-07 DOA: 05/26/2021 PCP: Terry Richards, MD  Brief Narrative: Terry Mitchell is a 70 y.o. male with medical history significant of metastatic prostate cancer with mets to the bones.  He had finished his radiation therapy approximately a week ago.  History obtained from patient's daughter.  Patient also followed by a thorough care palliative care.  And he is a DNR.  He lives at home with his daughter [name].   He has cervical myelopathy metastatic prostate cancer with mets to the spine status post chemo and radiation.  Daughter concerned that his esophagus is burnt from radiation and he is having difficulty swallowing food or drinking liquids.  They have been using lidocaine solution which has been helping some however patient continues with not eating or drinking and losing weight with generalized weakness.  He has a history of paraplegia and he is unable to walk  Patient complains of abdominal pain he cannot explain more than that to me.  The daughter is not able to tell me anything more than that.  Patient has had no nausea vomiting or diarrhea.  She is not sure when his last bowel movement was.  She also reports that he has been more confused at home. Review of care everywhere-patient was admitted to CIR at [facility] on 05/06/2021 and discharged first week of November. MRI of the total spine 04/05/2021 shows diffuse metastatic disease with epidural thickening and spinal canal stenosis from C3-C5 without cord signal change left foraminal innervation at C5-6 patient was deemed to be not a surgical candidate and was placed on steroid taper and radiation. He was also noted to have left peroneal and popliteal vein DVT on 04/26/2021 IVC filter was placed by interventional radiology at [facility] needs outpatient removal of this in 3 to 6 months.   ED Course: Vital signs in the ED blood pressure 142/82 pulse is 103 respiration 20 temperature 97.8  saturation 98% on room air CBG was 137.  Assessment & Plan:   Active Problems:   CAP (community acquired pneumonia)   Elevated serum creatinine      #1 community-acquired pneumonia/hospital-acquired pneumonia patient has been in and out of hospitals and rehabs for few weeks to months.  He is admitted with a cough generalized weakness shortness of breath decreased p.o. intake.   He was not hypoxic in the ER Chest x-ray shows right sided infiltrates consistent with consolidation Patient is allergic to cephalosporin Started Levaquin 750 mg every 48 hours to adjust for renal function.    #2 AKI with hypokalemia resolved with IV fluids.  Creatinine 1.4 down from 3.27 on admission.     #3 hypercalcemia due to dehydration resolved.   #4  Anemia/pancytopenia improving due to recent chemo likely monitor daily.  Hemoglobin was 6.4 and a stat repeat hemoglobin came back 6.9 he received 1 unit of packed RBC.  FOBT is pending.  Hemoglobin 8.2 today after 1 unit of transfusion.   #5 elevated alkaline phosphatase due to bony mets   #6 prostate cancer with mets to the bones and spine followed at [facility] he is status postradiation and chemo   #7 dysphagia likely related to radiation induced esophagitis consult speech therapy and lidocaine miracle mouthwash   #8 abdominal pain check abdominal ultrasound will consider CT of the abdomen and pelvis once creatinine improves  #9 paraplegia secondary to spinal mets-MRI of the thoracic spine in May 2022-diffuse osseous metastatic disease with  largest lesion at T6, circumferential epidural extension of tumor at T6-T7 with compression of the spinal cord. MRI of the lumbar spine in May 2022-diffuse lumbar osseous metastatic disease without pathologic fracture.  No spinal canal or neural foraminal stenosis of the upper lumbar spine. MRI done in 04/11/2021 outside of the system with diffuse spinal mets. MRI of the cervical thoracic and lumbar spine  ordered to see the progression of his mets as he is not able to move his lower extremities other than wiggle his toes.  Looking through the discharge summary from beginning of November 2022 from [facility] rehab patient apparently walked 8 feet and they discharged him home with DME's.  Daughter is disabled and cannot care for him 24/7.  We will look into skilled nursing facility.  PT consult pending.  #10 discharge plans for 05/30/2021.  Daughter reports she does not have DME needed to take care of her father at home.  TOC consulted.  #11 hypokalemia potassium is 3.0. Replete.  Normal mag.  #12 left peroneal and popliteal DVT on Eliquis this was diagnosed in October 2022.  He has an IVC filter in place placed by interventional radiology at [facility].  This needs to come out in 3 to 6 months.    Estimated body mass index is 26.61 kg/m as calculated from the following:   Height as of this encounter: 6' (1.829 m).   Weight as of this encounter: 89 kg.  DVT prophylaxis: SCD  code Status: DNR  family Communication: Discussed with daughter Disposition Plan:  Status is: Inpatient  Remains inpatient appropriate because: AKI dehydration, pancytopenia metastatic prostate cancer   Consultants:  None  Procedures: None Antimicrobials: Levofloxacin  Subjective:  Patient resting in bed did not eat breakfast this morning as he was not hungry Some notes from the [facility] says he was quadriplegic but he is able to use upper extremities for feeding himself. Apparently he walked 8 feet while he was at [facility] a week ago however he has not walked here he can wiggle his toes he has known spinal mets which is progressing from the MRI that was done recently. He was treated with steroids for the same.  Objective: Vitals:   05/28/21 1338 05/28/21 2045 05/29/21 0159 05/29/21 0604  BP: 130/78 (!) 144/77 (!) 143/71 (!) 165/86  Pulse: 97 97 100 99  Resp: 17 18 18 18   Temp: 98.4 F (36.9 C) 99.5 F (37.5 C) 98.5 F (36.9 C)  99 F (37.2 C)  TempSrc: Oral Oral Oral Oral  SpO2: 100% 100% 93% 95%  Weight:      Height:        Intake/Output Summary (Last 24 hours) at 05/29/2021 1204 Last data filed at 05/29/2021 1000 Gross per 24 hour  Intake 2652.17 ml  Output 1725 ml  Net 927.17 ml    Filed Weights   05/26/21 1349  Weight: 89 kg    Examination: Condom cath in place with clear urine  General exam: Appears in mild distress due to dysphagia Respiratory system: Clear to auscultation. Respiratory effort normal. Cardiovascular system: S1 & S2 heard, RRR. No JVD, murmurs, rubs, gallops or clicks. No pedal edema. Gastrointestinal system: Abdomen is nondistended, soft and nontender. No organomegaly or masses felt. Normal bowel sounds heard. Central nervous system: Awake alert oriented paraplegic extremities: 2+ pitting edema chronic Skin: No rashes, lesions or ulcers Psychiatry: Judgement and insight appear normal. Mood & affect appropriate.     Data Reviewed: I have personally reviewed following labs and  imaging studies  CBC: Recent Labs  Lab 05/26/21 1403 05/26/21 1715 05/26/21 1839 05/27/21 0428 05/28/21 0733  WBC 3.4* 2.9*  --  3.6* 3.8*  NEUTROABS 2.6  --   --   --   --   HGB 7.5* 6.4* 6.9* 7.8* 8.2*  HCT 25.0* 21.4* 23.3* 25.5* 27.3*  MCV 102.0* 102.4*  --  99.2 100.7*  PLT 127* 101*  --  103* 95*    Basic Metabolic Panel: Recent Labs  Lab 05/26/21 1403 05/26/21 1715 05/27/21 0428 05/28/21 0733 05/28/21 1105 05/29/21 0945  NA 144  --  142 144  --  144  K 4.2  --  3.5 2.8*  --  3.0*  CL 105  --  107 113*  --  111  CO2 28  --  27 23  --  23  GLUCOSE 103*  --  96 87  --  113*  BUN 73*  --  61* 45*  --  35*  CREATININE 3.27* 2.84* 2.34* 1.76*  --  1.40*  CALCIUM 11.5*  --  10.5* 9.9  --  9.8  MG  --   --   --   --  2.3  --     GFR: Estimated Creatinine Clearance: 55.4 mL/min (A) (by C-G formula based on SCr of 1.4 mg/dL (H)). Liver Function Tests: Recent Labs  Lab  05/26/21 1403 05/27/21 0428 05/28/21 0733  AST 40 37 36  ALT 12 11 10   ALKPHOS 697* 514* 500*  BILITOT 1.2 1.5* 1.0  PROT 6.3* 5.1* 4.8*  ALBUMIN 3.3* 2.4* 2.4*    No results for input(s): LIPASE, AMYLASE in the last 168 hours. No results for input(s): AMMONIA in the last 168 hours. Coagulation Profile: No results for input(s): INR, PROTIME in the last 168 hours. Cardiac Enzymes: No results for input(s): CKTOTAL, CKMB, CKMBINDEX, TROPONINI in the last 168 hours. BNP (last 3 results) No results for input(s): PROBNP in the last 8760 hours. HbA1C: No results for input(s): HGBA1C in the last 72 hours. CBG: No results for input(s): GLUCAP in the last 168 hours. Lipid Profile: No results for input(s): CHOL, HDL, LDLCALC, TRIG, CHOLHDL, LDLDIRECT in the last 72 hours. Thyroid Function Tests: No results for input(s): TSH, T4TOTAL, FREET4, T3FREE, THYROIDAB in the last 72 hours. Anemia Panel: No results for input(s): VITAMINB12, FOLATE, FERRITIN, TIBC, IRON, RETICCTPCT in the last 72 hours. Sepsis Labs: Recent Labs  Lab 05/26/21 1520  LATICACIDVEN 1.2     Recent Results (from the past 240 hour(s))  Culture, blood (Routine X 2) w Reflex to ID Panel     Status: None (Preliminary result)   Collection Time: 05/26/21  2:18 PM   Specimen: BLOOD  Result Value Ref Range Status   Specimen Description   Final    BLOOD RIGHT ANTECUBITAL Performed at [facility] [address], [place], [place] [address]    Special Requests   Final    BOTTLES DRAWN AEROBIC AND ANAEROBIC Blood Culture results may not be optimal due to an excessive volume of blood received in culture bottles Performed at [facility] [address], [place],  [address]    Culture   Final    NO GROWTH 3 DAYS Performed at [facility], [place] [address], [place],  [address]    Report Status PENDING  Incomplete  Culture, blood (Routine X 2) w Reflex to ID Panel      Status: None (Preliminary result)  Collection Time: 05/26/21  2:23 PM   Specimen: BLOOD  Result Value Ref Range Status   Specimen Description   Final    BLOOD LEFT ANTECUBITAL Performed at [facility] [address], [place], [place] [address]    Special Requests   Final    BOTTLES DRAWN AEROBIC AND ANAEROBIC Blood Culture adequate volume Performed at [facility] [address] [place], [place] [address]    Culture   Final    NO GROWTH 3 DAYS Performed at [facility], [place] [address], [place], [place] [address]    Report Status PENDING  Incomplete  Resp Panel by RT-PCR (Flu A&B, Covid) Nasopharyngeal Swab     Status: None   Collection Time: 05/26/21  3:30 PM   Specimen: Nasopharyngeal Swab; Nasopharyngeal(NP) swabs in vial transport medium  Result Value Ref Range Status   SARS Coronavirus 2 by RT PCR NEGATIVE NEGATIVE Final    Comment: (NOTE) SARS-CoV-2 target nucleic acids are NOT DETECTED.  The SARS-CoV-2 RNA is generally detectable in upper respiratory specimens during the acute phase of infection. The lowest concentration of SARS-CoV-2 viral copies this assay can detect is 138 copies/mL. A negative result does not preclude SARS-Cov-2 infection and should not be used as the sole basis for treatment or other patient management decisions. A negative result may occur with  improper specimen collection/handling, submission of specimen other than nasopharyngeal swab, presence of viral mutation(s) within the areas targeted by this assay, and inadequate number of viral copies(<138 copies/mL). A negative result must be combined with clinical observations, patient history, and epidemiological information. The expected result is Negative.  Fact Sheet for Patients:  EntrepreneurPulse.com.au  Fact Sheet for Healthcare Providers:  IncredibleEmployment.be  This test is no t yet approved or cleared  by the [place] FDA and  has been authorized for detection and/or diagnosis of SARS-CoV-2 by FDA under an Emergency Use Authorization (EUA). This EUA will remain  in effect (meaning this test can be used) for the duration of the COVID-19 declaration under Section 564(b)(1) of the Act, 21 U.S.C.section 360bbb-3(b)(1), unless the authorization is terminated  or revoked sooner.       Influenza A by PCR NEGATIVE NEGATIVE Final   Influenza B by PCR NEGATIVE NEGATIVE Final    Comment: (NOTE) The Xpert Xpress SARS-CoV-2/FLU/RSV plus assay is intended as an aid in the diagnosis of influenza from Nasopharyngeal swab specimens and should not be used as a sole basis for treatment. Nasal washings and aspirates are unacceptable for Xpert Xpress SARS-CoV-2/FLU/RSV testing.  Fact Sheet for Patients: EntrepreneurPulse.com.au  Fact Sheet for Healthcare Providers: IncredibleEmployment.be  This test is not yet approved or cleared by the [place] FDA and has been authorized for detection and/or diagnosis of SARS-CoV-2 by FDA under an Emergency Use Authorization (EUA). This EUA will remain in effect (meaning this test can be used) for the duration of the COVID-19 declaration under Section 564(b)(1) of the Act, 21 U.S.C. section 360bbb-3(b)(1), unless the authorization is terminated or revoked.  Performed at [facility], [place] [address], [place], [place] [address]           Radiology Studies: No results found.      Scheduled Meds:  (feeding supplement) PROSource Plus  30 mL Oral BID BM   amLODipine  7.5 mg Oral Daily   apixaban  5 mg Oral BID   feeding supplement  237 mL Oral BID BM   magic mouthwash w/lidocaine  5 mL Oral QID   multivitamin with minerals  1 tablet Oral Daily   sucralfate  1 g Oral TID WC & HS   Continuous Infusions:  lactated ringers     levofloxacin (LEVAQUIN) IV       LOS: 3 days    Time  spent: 40 minutes    Georgette Shell, MD 05/29/2021, 12:04 PM

## 2021-05-29 NOTE — Progress Notes (Signed)
Speech Language Pathology Treatment: Dysphagia  Patient Details Name: Terry Mitchell MRN: [id number] DOB: 26-Oct-1950 Today's Date: 05/29/2021 Time: 0539-7673 SLP Time Calculation (min) (ACUTE ONLY): 35 min  Assessment / Plan / Recommendation Clinical Impression  Patient seen by SLP for skilled tx of dysphagia. Patient was awake and alert when SLP entered room but he did c/o some soreness with his legs as he hasn't been able to stretch them out. Patient reported that he has been consuming some liquids (mainly water) but did have some puree solids (mashed potatoes). With the liquids, he reports that although he is able to swallow it down, he will get a sensation later of it "coming back up" and he does endorse incidents of regurgitation of liquids. Patient exhibiting facial grimacing with initial swallows of juice and he reported that it "burns a little" when he first starts to swallow. Pain/discomfort with swallowing appeared to decrease over time and patient consumed multiple sips of water during session. He did exhibit one instance of regurgitation of what appeared to be water mixed with secretions. SLP and RN assisted patient with sitting up at edge of bed per his request so he could stretch his legs a little and he did start to feel that he was having to regurgitate but this subsided. SLP recommended to patient that he continue to sip on liquids throughout the day, especially water to help keep mouth and throat moist/lubricated. SLP also suggested patient try bites of gelatin to see if that helps any with pain during swallow. SLP decided not to schedule MBS for this date and will plan to determine need/benefit with consideration of patient's ability to tolerate sitting in chair in radiology suite given his level of pain with movement.   HPI HPI: [employer] is a 70 y.o. male history of hypertension, prostate cancer with spinal, rib, and other bony metastasis, present emergency department with  chest pain and arm pain.  CXR remarkable for opacity in the R lower lobe.  MRI total spine from 04/05/21 shows diffuse metastatic disease with epidural thickening and spinal canal stenosis from C3-5 without cord signal change, and left foraminal invasion at C5-6.  Pt completed inpatient rehab with PT & OT (no ST) at [facility] from 10/15-11/01.      SLP Plan  Continue with current plan of care      Recommendations for follow up therapy are one component of a multi-disciplinary discharge planning process, led by the attending physician.  Recommendations may be updated based on patient status, additional functional criteria and insurance authorization.    Recommendations  Diet recommendations: Dysphagia 2 (fine chop);Thin liquid Liquids provided via: Cup;Straw Medication Administration: Whole meds with liquid Compensations: Slow rate;Small sips/bites Postural Changes and/or Swallow Maneuvers: Seated upright 90 degrees                Oral Care Recommendations: Oral care BID Follow up Recommendations: Outpatient SLP SLP Visit Diagnosis: Dysphagia, unspecified (R13.10) Plan: Continue with current plan of care       Sonia Baller, MA, CCC-SLP Speech Therapy

## 2021-05-29 NOTE — NC FL2 (Signed)
[place] LEVEL OF CARE SCREENING TOOL     IDENTIFICATION  Patient Name: Terry Mitchell Birthdate: [birth year] Sex: male Admission Date (Current Location): 05/26/2021  [facility] and [place] Number:  Herbalist and Address:  [facility],  [place], [place]      Provider Number: [id number]  Attending Physician Name and Address:  Georgette Shell, MD  Relative Name and Phone Number:  daughter, Docia Furl [phone]    Current Level of Care: Hospital Recommended Level of Care: [place] Prior Approval Number:    Date Approved/Denied:   PASRR Number: [id number]  Discharge Plan: SNF    Current Diagnoses: Patient Active Problem List   Diagnosis Date Noted   Elevated serum creatinine    CAP (community acquired pneumonia) 05/26/2021   High blood pressure 01/12/2021   Thrombocytopenia ([place]) 01/12/2021   Paraplegia ([place]) 12/20/2020   Pain of metastatic malignancy 12/14/2020   Cord compression ([place]) 12/11/2020   Stage 3b chronic kidney disease ([place]) 04/06/2020   Encounter for antineoplastic chemotherapy 04/06/2020   Hot flash in male 07/27/2019   Prostate cancer metastatic to bone ([place]) 05/15/2019   Prostate cancer ([place]) 03/19/2018   Androgen deprivation therapy 03/19/2018   PSA elevation 03/19/2018   Goals of care, counseling/discussion 03/19/2018   Chest pain 02/20/2018    Orientation RESPIRATION BLADDER Height & Weight     Self, Time, Situation, Place    Incontinent, External catheter Weight: 196 lb 3.4 oz (89 kg) Height:  6' (182.9 cm)  BEHAVIORAL SYMPTOMS/MOOD NEUROLOGICAL BOWEL NUTRITION STATUS      Incontinent Diet (Dys 2; thin liquids)  AMBULATORY STATUS COMMUNICATION OF NEEDS Skin   Extensive Assist Verbally Normal                       Personal Care Assistance Level of Assistance  Bathing, Dressing Bathing Assistance: Limited assistance   Dressing Assistance: Limited  assistance     Functional Limitations Info             SPECIAL CARE FACTORS FREQUENCY  PT (By licensed PT), OT (By licensed OT)     PT Frequency: 5x/wk OT Frequency: 5x/wk            Contractures Contractures Info: Not present    Additional Factors Info  Code Status, Allergies Code Status Info: DNR Allergies Info: Penicillins, Lactose Intolerance           Current Medications (05/29/2021):  This is the current hospital active medication list Current Facility-Administered Medications  Medication Dose Route Frequency Provider Last Rate Last Admin   (feeding supplement) PROSource Plus liquid 30 mL  30 mL Oral BID BM Georgette Shell, MD   30 mL at 05/29/21 1142   amLODipine (NORVASC) tablet 7.5 mg  7.5 mg Oral Daily Georgette Shell, MD   7.5 mg at 05/29/21 1030   apixaban (ELIQUIS) tablet 5 mg  5 mg Oral BID Georgette Shell, MD   5 mg at 05/29/21 1030   feeding supplement (ENSURE ENLIVE / ENSURE PLUS) liquid 237 mL  237 mL Oral BID BM Georgette Shell, MD   237 mL at 05/29/21 1031   lactated ringers infusion   Intravenous Continuous Lacretia Leigh, MD       levofloxacin (LEVAQUIN) IVPB 750 mg  750 mg Intravenous Q24H Georgette Shell, MD       magic mouthwash w/lidocaine  5 mL Oral QID Landis Gandy  G, MD   5 mL at 05/29/21 1031   multivitamin with minerals tablet 1 tablet  1 tablet Oral Daily Georgette Shell, MD   1 tablet at 05/29/21 1030   potassium chloride 10 mEq in 100 mL IVPB  10 mEq Intravenous Q1 Hr x 4 Georgette Shell, MD       sucralfate (CARAFATE) 1 GM/10ML suspension 1 g  1 g Oral TID WC & HS Georgette Shell, MD   1 g at 05/29/21 1142   Facility-Administered Medications Ordered in Other Encounters  Medication Dose Route Frequency Provider Last Rate Last Admin   sodium chloride flush (NS) 0.9 % injection 10 mL  10 mL Intravenous Once [name], MD         Discharge Medications: Please see discharge summary for a list  of discharge medications.  Relevant Imaging Results:  Relevant Lab Results:   Additional Information SS# [id number];  pt has had COVID vaccine x 3  Dijuan Sleeth, LCSW

## 2021-05-30 DIAGNOSIS — J189 Pneumonia, unspecified organism: Secondary | ICD-10-CM | POA: Diagnosis not present

## 2021-05-30 LAB — TYPE AND SCREEN
ABO/RH(D): O POS
Antibody Screen: NEGATIVE
Unit division: 0
Unit division: 0

## 2021-05-30 LAB — BASIC METABOLIC PANEL
Anion gap: 8 (ref 5–15)
BUN: 28 mg/dL — ABNORMAL HIGH (ref 8–23)
CO2: 22 mmol/L (ref 22–32)
Calcium: 9.2 mg/dL (ref 8.9–10.3)
Chloride: 112 mmol/L — ABNORMAL HIGH (ref 98–111)
Creatinine, Ser: 1.18 mg/dL (ref 0.61–1.24)
GFR, Estimated: >60 mL/min (ref >60)
Glucose, Bld: 104 mg/dL — ABNORMAL HIGH (ref 70–99)
Potassium: 2.9 mmol/L — ABNORMAL LOW (ref 3.5–5.1)
Sodium: 142 mmol/L (ref 135–145)

## 2021-05-30 LAB — BPAM RBC
Blood Product Expiration Date: 202211232359
Blood Product Expiration Date: 202211232359
ISSUE DATE / TIME: 202211050007
Unit Type and Rh: 5100
Unit Type and Rh: 5100

## 2021-05-30 IMAGING — MR MR LUMBAR SPINE WO/W CM
6 of 7 series · 31 of 48 positions shown · IV contrast (gadavist)
Comparison: Prior MRI from [DATE].

CLINICAL DATA: Initial evaluation for metastatic disease.

EXAM:
MRI LUMBAR SPINE WITHOUT AND WITH CONTRAST
TECHNIQUE: Multiplanar and multiecho pulse sequences of the lumbar spine were
obtained without and with intravenous contrast.
CONTRAST:  10mL GADAVIST GADOBUTROL 1 MMOL/ML IV SOLN

[Series 1: T1 · sagittal · 4.0mm · 0.81mm/px · 5 of 17 slices shown (1 of 2)]
[im 1/17]
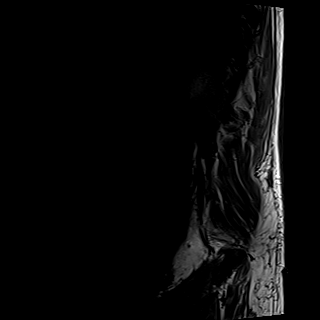
[im 5/17]
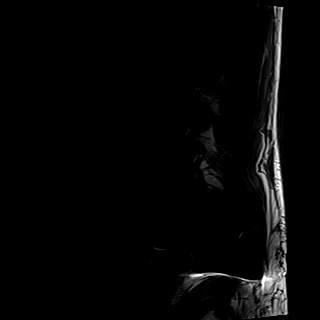
[im 9/17]
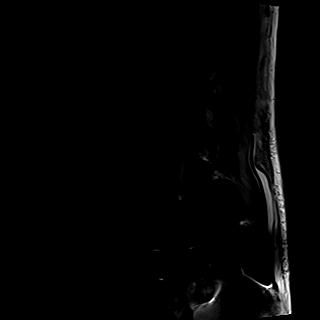
[im 13/17]
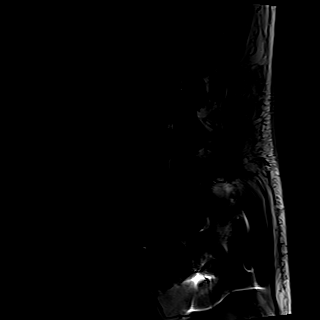
[im 17/17]
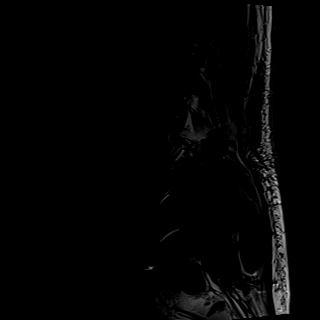

[Series 2: T2 · sagittal · 4.0mm · 0.81mm/px · 5 of 17 slices shown (1 of 2)]
[im 1/17]
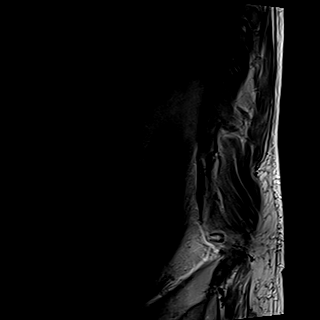
[im 5/17]
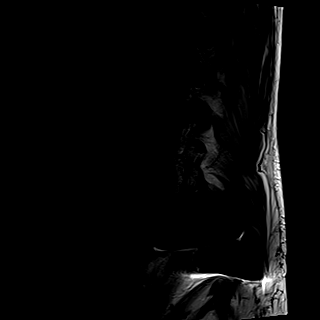
[im 9/17]
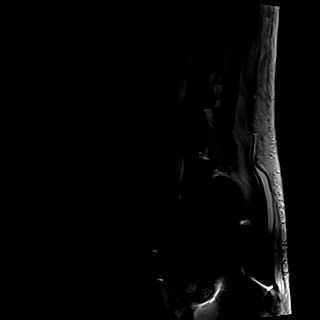
[im 13/17]
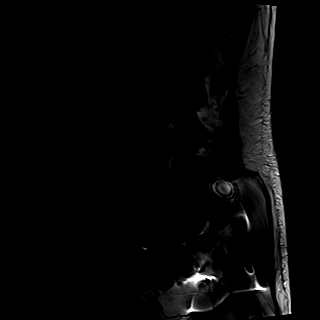
[im 17/17]
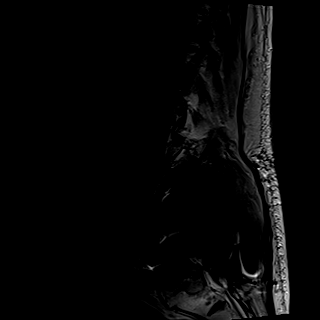

[Series 3: STIR · sagittal · 4.0mm · 0.51mm/px · 1 of 17 slices shown]
[im 1/17]
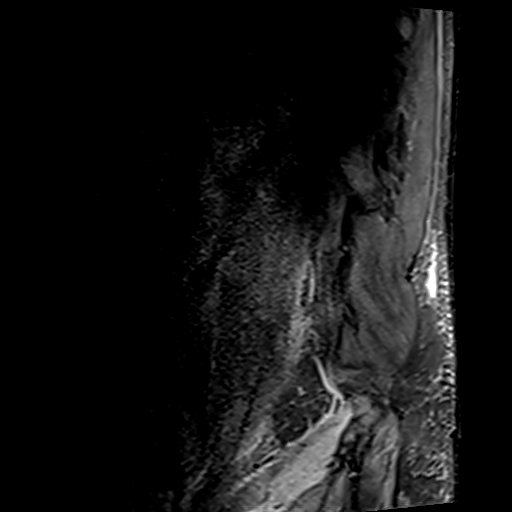

[Series 4: T2 · axial · 4.0mm · 0.62mm/px · z∈[-460,-246]mm · 8 of 43 slices shown (2 of 2)]
[im 1/43]
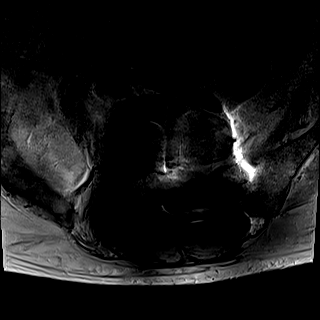
[im 5/43]
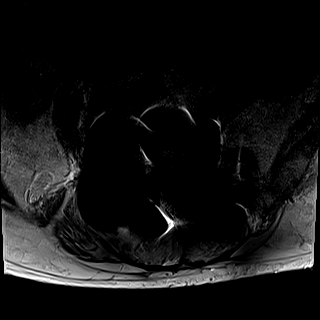
[im 15/43]
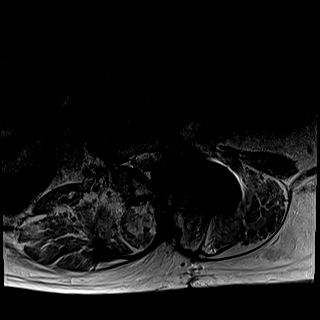
[im 19/43]
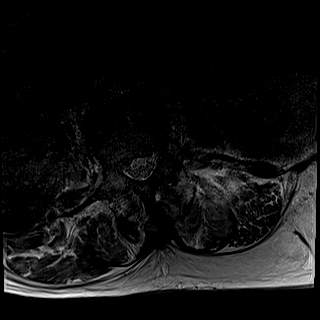
[im 24/43]
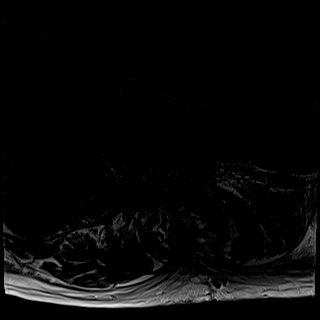
[im 29/43]
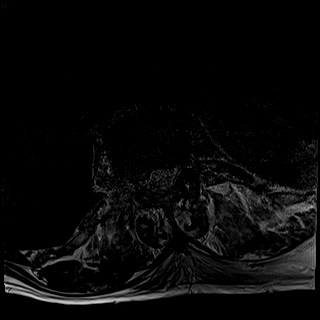
[im 38/43]
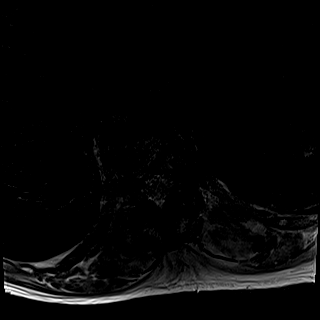
[im 43/43]
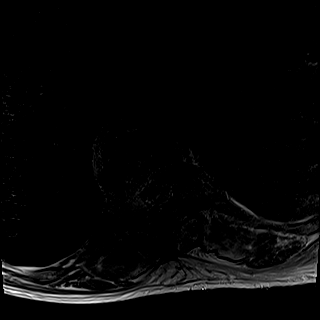

[Series 5: T1 · axial · 4.0mm · 0.39mm/px · z∈[-460,-246]mm · 8 of 43 slices shown (2 of 2)]
[im 1/43]
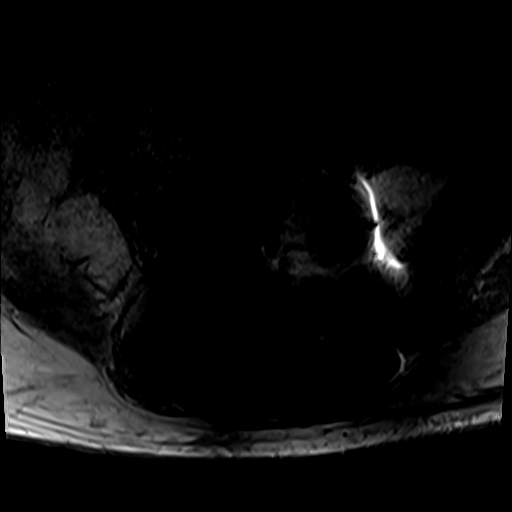
[im 5/43]
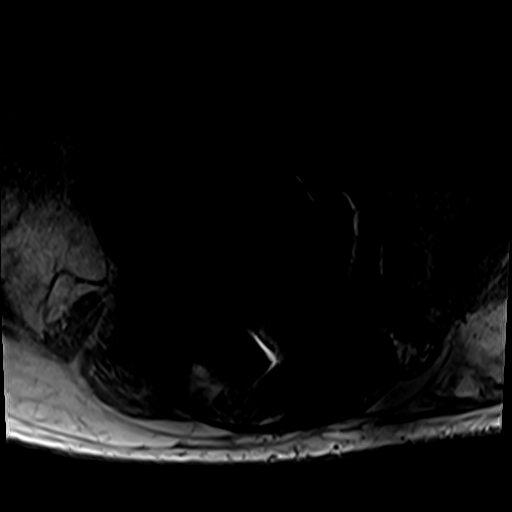
[im 15/43]
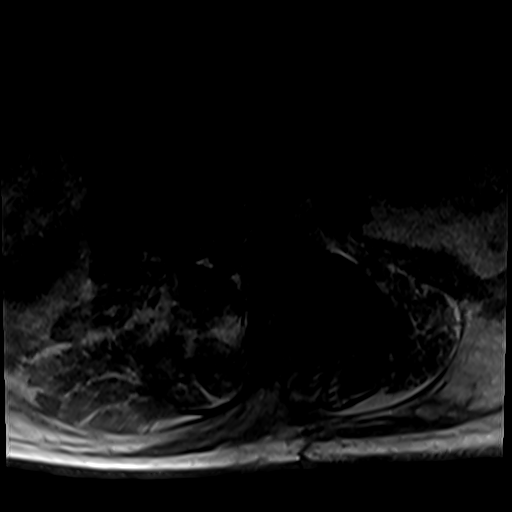
[im 19/43]
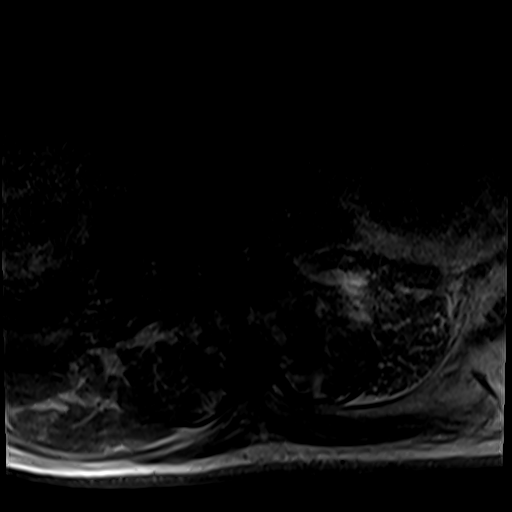
[im 24/43]
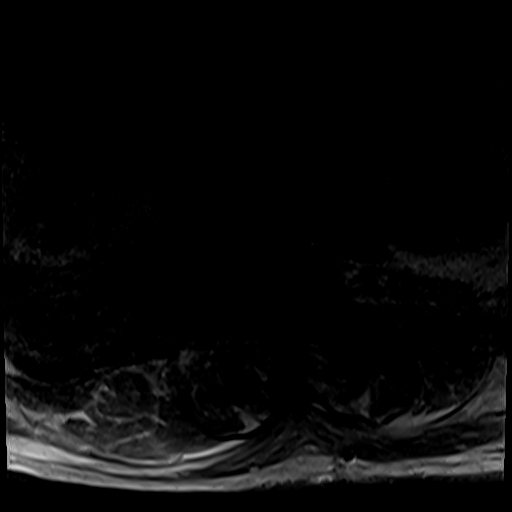
[im 29/43]
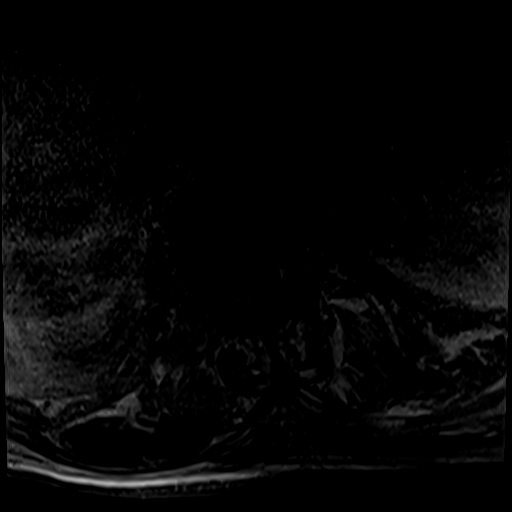
[im 38/43]
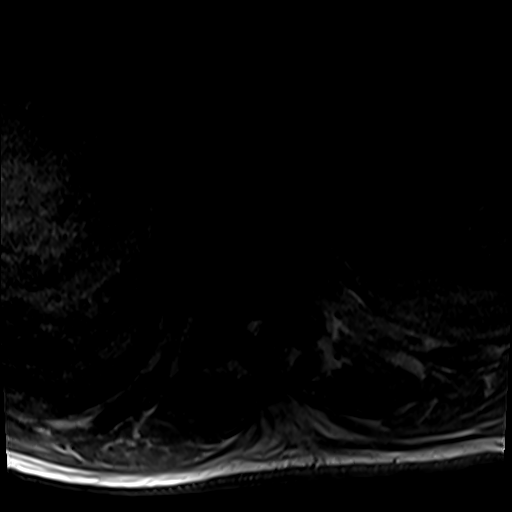
[im 43/43]
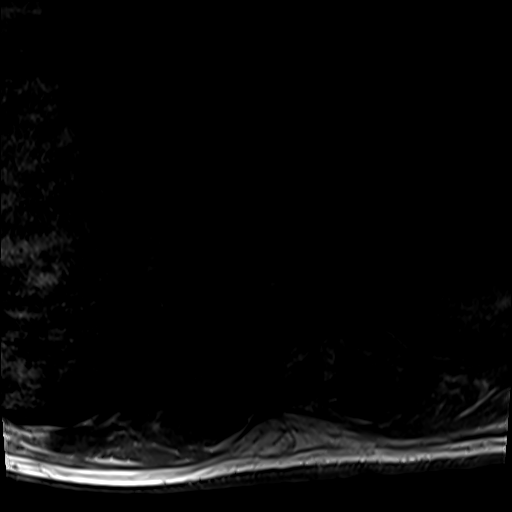

[Series 7: T1 fat-sat post-contrast · sagittal · 4.0mm · 0.81mm/px · 4 of 17 slices shown]
[im 1/17]
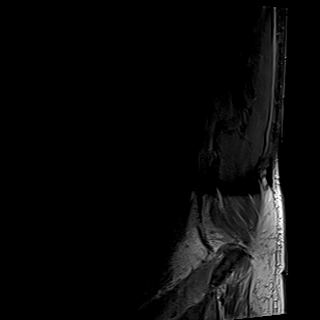
[im 6/17]
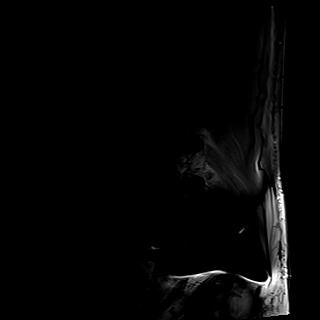
[im 11/17]
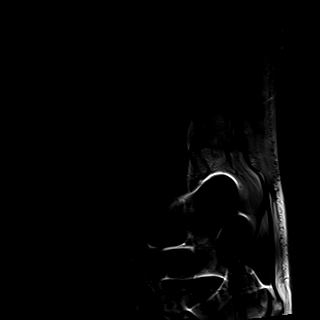
[im 17/17]
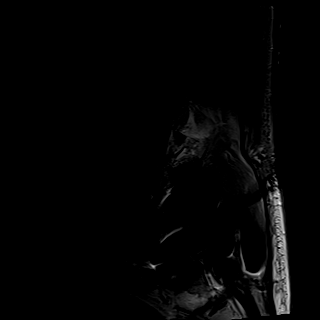

[31 of 48 positions shown; findings below may reference images not displayed]

FINDINGS: Segmentation: Standard. Same numbering system employed as on
previous exam.

Alignment: Moderate dextroscoliosis. No interval listhesis or
malalignment.

Vertebrae: Susceptibility artifact related to prior posterior fusion
at L4 through S1. Underlying widespread osseous metastatic disease
again seen, essentially involving all levels as well as the
visualized sacrum and pelvis. Vertebral body height maintained
without interval pathologic fracture. Interval Schmorl's node
deformity at the inferior endplate of T12 noted. Diffusely decreased
T1 signal intensity within the underlying bone marrow, progressed
from prior, and likely reflecting post treatment changes. No
significant epidural extension of tumor within the lumbar spine.
Osseous expansion about multiple pedicles with associated mild to
moderate foraminal narrowing at multiple levels, most pronounced at
T10-11 through L1-2 on the right (series 1, image 7), and L3-4 on
the left (series 1, image 14).

Conus medullaris and cauda equina: Conus extends to the L1 level.
Conus medullaris within normal limits. Visualized nerve roots of the
cauda equina are unremarkable. Distal nerve roots are obscured by
susceptibility artifact.

Paraspinal and other soft tissues: Chronic postoperative changes
noted within the lower posterior paraspinous soft tissues. No
visible acute soft tissue abnormality.

Disc levels:

No significant disc pathology seen through the L3-4 level.
Evaluation of the lower lumbar spine limited by extensive
susceptibility artifact. No visible significant spinal stenosis.
IMPRESSION: 1. Diffuse osseous metastatic disease throughout the lumbar spine.
No pathologic fracture or significant epidural extension of tumor.
Associated osseous expansion about multiple pedicles with
progressive mild to moderate foraminal narrowing on the right at
T10-11 through L1-2, and on the left at L3-4.
2. Prior posterior fusion at L4 through S1. No progressive spinal
stenosis within the upper lumbar spine.

## 2021-05-30 MED ORDER — HYDROMORPHONE HCL 1 MG/ML IJ SOLN
0.5000 mg | INTRAMUSCULAR | Status: AC | PRN
  Administered 2021-05-30 – 2021-05-31 (×2): 0.5 mg via INTRAVENOUS
  Filled 2021-05-30 (×2): qty 0.5

## 2021-05-30 MED ORDER — FUROSEMIDE 10 MG/ML IJ SOLN
40.0000 mg | Freq: Once | INTRAMUSCULAR | Status: AC
  Administered 2021-05-30: 40 mg via INTRAVENOUS
  Filled 2021-05-30: qty 4

## 2021-05-30 MED ORDER — POTASSIUM CHLORIDE 10 MEQ/100ML IV SOLN
10.0000 meq | INTRAVENOUS | Status: DC
  Administered 2021-05-30 – 2021-05-31 (×5): 10 meq via INTRAVENOUS
  Filled 2021-05-30 (×7): qty 100

## 2021-05-30 MED ORDER — FUROSEMIDE 10 MG/ML IJ SOLN
20.0000 mg | Freq: Every day | INTRAMUSCULAR | Status: AC
  Administered 2021-05-31 – 2021-06-01 (×2): 20 mg via INTRAVENOUS
  Filled 2021-05-30 (×3): qty 2

## 2021-05-30 MED ORDER — POTASSIUM CHLORIDE CRYS ER 20 MEQ PO TBCR
20.0000 meq | EXTENDED_RELEASE_TABLET | Freq: Every day | ORAL | Status: DC
  Filled 2021-05-30: qty 1

## 2021-05-30 NOTE — Progress Notes (Signed)
PROGRESS NOTE    Terry Mitchell  WNI:[id number] DOB: 03/08/1951 DOA: 05/26/2021 PCP: [name], MD  Brief Narrative: Terry Mitchell is a 70 y.o. male with medical history significant of metastatic prostate cancer with mets to the bones.  He had finished his radiation therapy approximately a week ago.  History obtained from patient's daughter.  Patient also followed by a thorough care palliative care.  And he is a DNR.  He lives at home with his daughter [name].   He has cervical myelopathy metastatic prostate cancer with mets to the spine status post chemo and radiation.  Daughter concerned that his esophagus is burnt from radiation and he is having difficulty swallowing food or drinking liquids.  They have been using lidocaine solution which has been helping some however patient continues with not eating or drinking and losing weight with generalized weakness.  He has a history of paraplegia and he is unable to walk  Patient complains of abdominal pain he cannot explain more than that to me.  The daughter is not able to tell me anything more than that.  Patient has had no nausea vomiting or diarrhea.  She is not sure when his last bowel movement was.  She also reports that he has been more confused at home. Review of care everywhere-patient was admitted to CIR at [facility] on 05/06/2021 and discharged first week of November. MRI of the total spine 04/05/2021 shows diffuse metastatic disease with epidural thickening and spinal canal stenosis from C3-C5 without cord signal change left foraminal innervation at C5-6 patient was deemed to be not a surgical candidate and was placed on steroid taper and radiation. He was also noted to have left peroneal and popliteal vein DVT on 04/26/2021 IVC filter was placed by interventional radiology at [facility] needs outpatient removal of this in 3 to 6 months.   ED Course: Vital signs in the ED blood pressure 142/82 pulse is 103 respiration 20 temperature 97.8  saturation 98% on room air CBG was 137.  Assessment & Plan:   Active Problems:   CAP (community acquired pneumonia)   Elevated serum creatinine      #1 CAP-he was admitted with cough and generalized weakness and shortness of breath with decreased p.o. intake.  Chest x-ray showed right-sided infiltrates consistent with consolidation.  He was not hypoxic in the ER.  Patient is allergic to cephalosporin Started Levaquin 750 mg every 48 hours to adjust for renal function.    #2 AKI with hypokalemia resolved with IV fluids.  Creatinine 1.18 down from 3.27 on admission.     #3 hypercalcemia due to dehydration resolved.   #4  Anemia/pancytopenia improving due to recent chemo likely monitor daily.  Hemoglobin was 6.4 and a stat repeat hemoglobin came back 6.9 he received 1 unit of packed RBC.  FOBT is pending.  Hemoglobin 8.2  after 1 unit of transfusion.   #5 elevated alkaline phosphatase due to bony mets   #6 prostate cancer with mets to the bones and spine followed at [facility] he is status postradiation and chemo MRI C SPINE T SPINE L SPINE DIFFUSE METS (SEE DICTATION)   #7 dysphagia likely related to radiation induced esophagitis continue lidocaine miracle mouthwash Speech consulted recommends dysphagia 2 diet   #8 abdominal pain resolved   #9 paraplegia secondary to spinal mets-MRI of the thoracic spine in May 2022-diffuse osseous metastatic disease with largest lesion at T6, circumferential epidural extension of tumor at T6-T7 with compression of the  spinal cord. MRI of the lumbar spine in May 2022-diffuse lumbar osseous metastatic disease without pathologic fracture.  No spinal canal or neural foraminal stenosis of the upper lumbar spine. MRI done in 04/11/2021 outside of the system with diffuse spinal mets. MRI of the cervical thoracic and lumbar spine ordered to see the progression of his mets as he is not able to move his lower extremities other than wiggle his toes.   Looking through the discharge summary from beginning of November 2022 from [facility] rehab patient apparently walked 8 feet and they discharged him home with DME's.  Daughter is disabled and cannot care for him 24/7.  We will look into skilled nursing facility.  PT consult recomends SNF  #10 discharge plans for 05/31/2021.  TOC consulted.AWAIT auth  #11 hypokalemia potassium is 2.9  Replete.  Normal mag.  #12 left peroneal and popliteal DVT on Eliquis this was diagnosed in October 2022.  He has an IVC filter in place placed by interventional radiology at [facility].  This needs to come out in 3 to 6 months.    Estimated body mass index is 26.61 kg/m as calculated from the following:   Height as of this encounter: 6' (1.829 m).   Weight as of this encounter: 89 kg.  DVT prophylaxis: SCD  code Status: DNR  family Communication: Discussed with daughter Disposition Plan:  Status is: Inpatient  Remains inpatient appropriate because: AKI dehydration, pancytopenia metastatic prostate cancer   Consultants:  None  Procedures: None Antimicrobials: Levofloxacin  Subjective: He is resting in bed he did not eat breakfast as he was not hungry still having some dysphagia/odynophagia  Objective: Vitals:   05/29/21 1303 05/29/21 2145 05/30/21 0524 05/30/21 1355  BP: (!) 147/87 (!) 141/84 133/79 127/77  Pulse: (!) 102 (!) 109 (!) 107 (!) 102  Resp: 17 20 20 18   Temp: 98.2 F (36.8 C) 97.9 F (36.6 C) 98.3 F (36.8 C) 98.3 F (36.8 C)  TempSrc:  Oral Oral   SpO2: 97% 97% 97% 97%  Weight:      Height:        Intake/Output Summary (Last 24 hours) at 05/30/2021 1432 Last data filed at 05/30/2021 1423 Gross per 24 hour  Intake 796.36 ml  Output 1675 ml  Net -878.64 ml    Filed Weights   05/26/21 1349  Weight: 89 kg    Examination: Condom cath in place with clear urine  General exam: Appears in mild distress due to dysphagia Respiratory system: Clear to auscultation. Respiratory effort  normal.diminished at bases  Cardiovascular system: S1 & S2 heard, RRR. No JVD, murmurs, rubs, gallops or clicks. No pedal edema. Gastrointestinal system: Abdomen is nondistended, soft and nontender. No organomegaly or masses felt. Normal bowel sounds heard. Central nervous system: Awake alert oriented paraplegic extremities: 2+ pitting edema chronic Skin: No rashes, lesions or ulcers Psychiatry: Judgement and insight appear normal. Mood & affect appropriate.     Data Reviewed: I have personally reviewed following labs and imaging studies  CBC: Recent Labs  Lab 05/26/21 1403 05/26/21 1715 05/26/21 1839 05/27/21 0428 05/28/21 0733  WBC 3.4* 2.9*  --  3.6* 3.8*  NEUTROABS 2.6  --   --   --   --   HGB 7.5* 6.4* 6.9* 7.8* 8.2*  HCT 25.0* 21.4* 23.3* 25.5* 27.3*  MCV 102.0* 102.4*  --  99.2 100.7*  PLT 127* 101*  --  103* 95*    Basic Metabolic Panel: Recent Labs  Lab 05/27/21 0428 05/28/21  7106 05/28/21 1105 05/29/21 0945 05/29/21 1245 05/30/21 0714  NA 142 144  --  144 143 142  K 3.5 2.8*  --  3.0* 2.7* 2.9*  CL 107 113*  --  111 114* 112*  CO2 27 23  --  23 24 22   GLUCOSE 96 87  --  113* 111* 104*  BUN 61* 45*  --  35* 35* 28*  CREATININE 2.34* 1.76*  --  1.40* 1.30* 1.18  CALCIUM 10.5* 9.9  --  9.8 9.5 9.2  MG  --   --  2.3  --   --   --     GFR: Estimated Creatinine Clearance: 65.8 mL/min (by C-G formula based on SCr of 1.18 mg/dL). Liver Function Tests: Recent Labs  Lab 05/26/21 1403 05/27/21 0428 05/28/21 0733  AST 40 37 36  ALT 12 11 10   ALKPHOS 697* 514* 500*  BILITOT 1.2 1.5* 1.0  PROT 6.3* 5.1* 4.8*  ALBUMIN 3.3* 2.4* 2.4*    No results for input(s): LIPASE, AMYLASE in the last 168 hours. No results for input(s): AMMONIA in the last 168 hours. Coagulation Profile: No results for input(s): INR, PROTIME in the last 168 hours. Cardiac Enzymes: No results for input(s): CKTOTAL, CKMB, CKMBINDEX, TROPONINI in the last 168 hours. BNP (last 3  results) No results for input(s): PROBNP in the last 8760 hours. HbA1C: No results for input(s): HGBA1C in the last 72 hours. CBG: No results for input(s): GLUCAP in the last 168 hours. Lipid Profile: No results for input(s): CHOL, HDL, LDLCALC, TRIG, CHOLHDL, LDLDIRECT in the last 72 hours. Thyroid Function Tests: No results for input(s): TSH, T4TOTAL, FREET4, T3FREE, THYROIDAB in the last 72 hours. Anemia Panel: No results for input(s): VITAMINB12, FOLATE, FERRITIN, TIBC, IRON, RETICCTPCT in the last 72 hours. Sepsis Labs: Recent Labs  Lab 05/26/21 1520  LATICACIDVEN 1.2     Recent Results (from the past 240 hour(s))  Culture, blood (Routine X 2) w Reflex to ID Panel     Status: None (Preliminary result)   Collection Time: 05/26/21  2:18 PM   Specimen: BLOOD  Result Value Ref Range Status   Specimen Description   Final    BLOOD RIGHT ANTECUBITAL Performed at [facility] [address], [place], [place] [address]    Special Requests   Final    BOTTLES DRAWN AEROBIC AND ANAEROBIC Blood Culture results may not be optimal due to an excessive volume of blood received in culture bottles Performed at [facility] [address], [place], [place] [address]    Culture   Final    NO GROWTH 4 DAYS Performed at [facility], [place] [address], [place], [place] [address]    Report Status PENDING  Incomplete  Culture, blood (Routine X 2) w Reflex to ID Panel     Status: None (Preliminary result)   Collection Time: 05/26/21  2:23 PM   Specimen: BLOOD  Result Value Ref Range Status   Specimen Description   Final    BLOOD LEFT ANTECUBITAL Performed at [facility] [address] [place], [place] [address]    Special Requests   Final    BOTTLES DRAWN AEROBIC AND ANAEROBIC Blood Culture adequate volume Performed at [facility] [address], [place], [place] [address]    Culture   Final    NO  GROWTH 4 DAYS Performed at [facility], [place] [address], [place],  [address]  Report Status PENDING  Incomplete  Resp Panel by RT-PCR (Flu A&B, Covid) Nasopharyngeal Swab     Status: None   Collection Time: 05/26/21  3:30 PM   Specimen: Nasopharyngeal Swab; Nasopharyngeal(NP) swabs in vial transport medium  Result Value Ref Range Status   SARS Coronavirus 2 by RT PCR NEGATIVE NEGATIVE Final    Comment: (NOTE) SARS-CoV-2 target nucleic acids are NOT DETECTED.  The SARS-CoV-2 RNA is generally detectable in upper respiratory specimens during the acute phase of infection. The lowest concentration of SARS-CoV-2 viral copies this assay can detect is 138 copies/mL. A negative result does not preclude SARS-Cov-2 infection and should not be used as the sole basis for treatment or other patient management decisions. A negative result may occur with  improper specimen collection/handling, submission of specimen other than nasopharyngeal swab, presence of viral mutation(s) within the areas targeted by this assay, and inadequate number of viral copies(<138 copies/mL). A negative result must be combined with clinical observations, patient history, and epidemiological information. The expected result is Negative.  Fact Sheet for Patients:  EntrepreneurPulse.com.au  Fact Sheet for Healthcare Providers:  IncredibleEmployment.be  This test is no t yet approved or cleared by the [place] FDA and  has been authorized for detection and/or diagnosis of SARS-CoV-2 by FDA under an Emergency Use Authorization (EUA). This EUA will remain  in effect (meaning this test can be used) for the duration of the COVID-19 declaration under Section 564(b)(1) of the Act, 21 U.S.C.section 360bbb-3(b)(1), unless the authorization is terminated  or revoked sooner.       Influenza A by PCR NEGATIVE NEGATIVE Final   Influenza B by PCR NEGATIVE NEGATIVE Final     Comment: (NOTE) The Xpert Xpress SARS-CoV-2/FLU/RSV plus assay is intended as an aid in the diagnosis of influenza from Nasopharyngeal swab specimens and should not be used as a sole basis for treatment. Nasal washings and aspirates are unacceptable for Xpert Xpress SARS-CoV-2/FLU/RSV testing.  Fact Sheet for Patients: EntrepreneurPulse.com.au  Fact Sheet for Healthcare Providers: IncredibleEmployment.be  This test is not yet approved or cleared by the [place] FDA and has been authorized for detection and/or diagnosis of SARS-CoV-2 by FDA under an Emergency Use Authorization (EUA). This EUA will remain in effect (meaning this test can be used) for the duration of the COVID-19 declaration under Section 564(b)(1) of the Act, 21 U.S.C. section 360bbb-3(b)(1), unless the authorization is terminated or revoked.  Performed at [facility], [place] [address] [place], [place] [address]           Radiology Studies: MR CERVICAL SPINE W WO CONTRAST  Result Date: 05/30/2021 CLINICAL DATA:  Initial evaluation for metastatic prostate cancer. Paraplegia. EXAM: MRI CERVICAL SPINE WITHOUT AND WITH CONTRAST TECHNIQUE: Multiplanar and multiecho pulse sequences of the cervical spine, to include the craniocervical junction and cervicothoracic junction, were obtained without and with intravenous contrast. CONTRAST:  86mL GADAVIST GADOBUTROL 1 MMOL/ML IV SOLN COMPARISON:  None available. FINDINGS: Alignment: Reversal of the normal cervical lordosis, apex at C6. No listhesis. Vertebrae: Abnormal signal intensity seen diffusely throughout the visualized osseous structures, consistent with osseous metastatic disease. There is likely involvement of all levels. Superimposed marrow edema and enhancement at C4 through C7 suspected to reflect superimposed radiation changes. Height loss involving the C5, C6, and C7 vertebral bodies noted, favored to be  chronic and degenerative. Vertebral bodies are partially ankylosed at these levels. Vertebral body height otherwise maintained with no other pathologic fracture. Probable subtle extraosseous extension with  involvement of the ventral epidural space at approximately C4 through C6. Epidural extension into the left neural foramina of C4-5 through C7-T1. Probable epidural extension into the right neural foramina at C6-7 and C7-T1. Cord: Normal signal and morphology. No cord signal changes to suggest myelopathy. Posterior Fossa, vertebral arteries, paraspinal tissues: Visualized brain and posterior fossa within normal limits. Craniocervical junction normal. Diffuse soft tissue swelling with edema and layering retropharyngeal effusion seen involving the prevertebral/retropharyngeal soft tissues, most pronounced at C4-5. Additional edema within the left greater than right paraspinous and soft tissues of the neck. Findings likely reflect post radiation changes. No loculated collections. Normal flow voids seen within the vertebral arteries bilaterally. Disc levels: C2-C3: Mild degenerative intervertebral disc space narrowing with disc desiccation. Right greater than left uncovertebral spurring without significant disc bulge. Severe right with mild-to-moderate left facet arthrosis. No significant spinal stenosis. Severe right C3 foraminal narrowing. Left neural foramen remains patent. C3-C4: Degenerative intervertebral disc space narrowing. Central disc protrusion indents the ventral thecal sac, contacting and mildly flattening the ventral cord. Associated annular fissure. Severe right with moderate left facet arthrosis. Mild bilateral uncovertebral spurring. Moderate spinal stenosis with severe bilateral C4 foraminal narrowing. C4-C5: Degenerative intervertebral disc space narrowing. Broad-based central disc osteophyte complex indents and largely effaces the ventral thecal sac. Mild cord flattening without cord signal  changes. Moderate right worse than left facet hypertrophy. Moderate spinal stenosis. Moderate right C5 foraminal narrowing. Probable tumor extension into the left neural foramen with moderate left foraminal stenosis. C5-C6: Advanced degenerative intervertebral disc space narrowing with diffuse disc osteophyte complex. C5 and C6 vertebral bodies are partially ankylosed anteriorly. Flattening and partial effacement of the ventral thecal sac, asymmetric to the right. Mild cord flattening without cord signal changes. Moderate spinal stenosis. Right worse than left uncovertebral spurring with resultant moderate right C6 foraminal narrowing. Tumor extension into the left neural foramen with moderate to severe left foraminal stenosis. C6-C7: Advanced degenerative intervertebral disc space narrowing with diffuse disc osteophyte complex. C6 and C7 vertebral bodies are partially ankylosed. Broad posterior component flattens and effaces the ventral thecal sac, asymmetric to the right. Mild cord flattening without cord signal changes. Moderate spinal stenosis. Superimposed uncovertebral hypertrophy with tumor extension into the bilateral neural foramina. Resultant fairly severe bilateral foraminal stenosis. C7-T1: Moderate degenerative intervertebral disc space narrowing with diffuse disc bulge, asymmetric to the right. Superimposed endplate and uncovertebral spurring. Probable tumor extension into the right greater than left neural foramina. Mild to moderate spinal stenosis. Severe right worse than left C8 foraminal narrowing. IMPRESSION: 1. Diffuse osseous metastatic disease throughout the cervical spine. 2. Probable subtle extraosseous extension of tumor into the ventral epidural space at C4 through C6, with additional epidural involvement into the left neural foramina at C4-5 through C7-T1, and the right neural foramina at C6-7 and C7-T1. 3. Superimposed edema and enhancement involving the C4 through C7 vertebral bodies,  suspected to reflect post radiation changes. Similarly, prevertebral edema and effusion also suspected to reflect post treatment changes. Correlation with history recommended. 4. Underlying advanced multilevel degenerative spondylosis with resultant moderate diffuse spinal stenosis at C3-4 through C6-7. No cord signal changes to suggest myelopathy. Associated moderate to severe bilateral C3 through C8 foraminal narrowing as above. Electronically Signed   By: Jeannine Boga M.D.   On: 05/30/2021 02:57   MR THORACIC SPINE W WO CONTRAST  Result Date: 05/30/2021 CLINICAL DATA:  Initial evaluation for metastatic prostate cancer, status post chemo radiation. EXAM: MRI THORACIC WITHOUT AND WITH CONTRAST TECHNIQUE: Multiplanar  and multiecho pulse sequences of the thoracic spine were obtained without and with intravenous contrast. CONTRAST:  85mL GADAVIST GADOBUTROL 1 MMOL/ML IV SOLN COMPARISON:  Previous MRI from 12/11/2020. FINDINGS: Alignment: Examination degraded by motion. Physiologic with preservation of the normal thoracic kyphosis. No interval listhesis or malalignment. Vertebrae: Widespread osseous metastatic disease again seen throughout the thoracic spine. Vertebral body height maintained without interval pathologic fracture. Previously seen extraosseous extension of tumor at the level of T6 is improved, with only a small amount of residual epidural tumor now seen at this level. Mild epidural extension into the right ventral epidural space at the levels of T3 and T4, similar to previous. Now seen is somewhat smooth diffuse enhancement involving both the ventral and dorsal epidural space extending from the cervicothoracic junction through T6 (series 52, image 11). Given the relatively smooth morphology of this finding, this is suspected to in large part reflect post radiation changes, although a small amount of superimposed epidural tumor is likely present as well, most pronounced within the ventral  epidural space at the levels of T3 and T4. Progressive epidural lipomatosis also noted within the upper thoracic spine. There is progressive mild to moderate spinal stenosis at the level of T2-3 with minimal cord flattening, but no cord signal changes (series 41, image 7). Osseous expansion and/or early epidural extension of tumor into multiple right-sided neural foramina noted as well, most pronounced at T6-7 (series 51, image 9). Similar but less pronounced changes seen about the left neural foramina, most pronounced at T3-4 (series 51, image 15). Cord: Patchy T2 signal abnormality seen within the thoracic cord at the level of T6 at site of previously seen cord compression, likely reflecting myelomalacia (series 51, image 12). No other convincing cord signal changes seen on this motion degraded exam. Progressive mild to moderate spinal stenosis at the level of T2-3 as above. No other significant stenosis seen within the thoracic spine. Paraspinal and other soft tissues: Large layering bilateral pleural effusions noted. Paraspinous soft tissues demonstrate no other acute finding. Disc levels: Underlying mild for age spondylosis, not significantly changed or progressed from prior. No other significant spinal stenosis within the thoracic spine. IMPRESSION: 1. Widespread osseous metastatic disease throughout the thoracic spine. No interval pathologic fracture. 2. Interval improvement in previously seen epidural tumor at T6, consistent with response to therapy. No significant residual spinal stenosis at this level. Patchy cord signal abnormality at this level most likely reflects myelomalacia given the prior stenosis. 3. New fairly smooth epidural enhancement extending from the cervicothoracic junction through T6, favored to in large part reflect post radiation changes, although there is undoubtedly a small amount of superimposed epidural tumor at a few levels as above. Progressive mild to moderate spinal stenosis at  the level of T2-3. Attention at follow-up recommended. 4. Large layering bilateral pleural effusions. Electronically Signed   By: Jeannine Boga M.D.   On: 05/30/2021 04:54   MR Lumbar Spine W Wo Contrast  Result Date: 05/30/2021 CLINICAL DATA:  Initial evaluation for metastatic disease. EXAM: MRI LUMBAR SPINE WITHOUT AND WITH CONTRAST TECHNIQUE: Multiplanar and multiecho pulse sequences of the lumbar spine were obtained without and with intravenous contrast. CONTRAST:  72mL GADAVIST GADOBUTROL 1 MMOL/ML IV SOLN COMPARISON:  Prior MRI from 12/11/2020. FINDINGS: Segmentation: Standard. Same numbering system employed as on previous exam. Alignment: Moderate dextroscoliosis. No interval listhesis or malalignment. Vertebrae: Susceptibility artifact related to prior posterior fusion at L4 through S1. Underlying widespread osseous metastatic disease again seen, essentially involving all levels  as well as the visualized sacrum and pelvis. Vertebral body height maintained without interval pathologic fracture. Interval Schmorl's node deformity at the inferior endplate of Z32 noted. Diffusely decreased T1 signal intensity within the underlying bone marrow, progressed from prior, and likely reflecting post treatment changes. No significant epidural extension of tumor within the lumbar spine. Osseous expansion about multiple pedicles with associated mild to moderate foraminal narrowing at multiple levels, most pronounced at T10-11 through L1-2 on the right (series 1, image 7), and L3-4 on the left (series 1, image 14). Conus medullaris and cauda equina: Conus extends to the L1 level. Conus medullaris within normal limits. Visualized nerve roots of the cauda equina are unremarkable. Distal nerve roots are obscured by susceptibility artifact. Paraspinal and other soft tissues: Chronic postoperative changes noted within the lower posterior paraspinous soft tissues. No visible acute soft tissue abnormality. Disc levels:  No significant disc pathology seen through the L3-4 level. Evaluation of the lower lumbar spine limited by extensive susceptibility artifact. No visible significant spinal stenosis. IMPRESSION: 1. Diffuse osseous metastatic disease throughout the lumbar spine. No pathologic fracture or significant epidural extension of tumor. Associated osseous expansion about multiple pedicles with progressive mild to moderate foraminal narrowing on the right at T10-11 through L1-2, and on the left at L3-4. 2. Prior posterior fusion at L4 through S1. No progressive spinal stenosis within the upper lumbar spine. Electronically Signed   By: Jeannine Boga M.D.   On: 05/30/2021 05:19        Scheduled Meds:  (feeding supplement) PROSource Plus  30 mL Oral BID BM   amLODipine  7.5 mg Oral Daily   apixaban  5 mg Oral BID   feeding supplement  237 mL Oral BID BM   magic mouthwash w/lidocaine  5 mL Oral QID   multivitamin with minerals  1 tablet Oral Daily   sucralfate  1 g Oral TID WC & HS   Continuous Infusions:  lactated ringers     levofloxacin (LEVAQUIN) IV 750 mg (05/30/21 1430)     LOS: 4 days    Time spent: 40 minutes    Georgette Shell, MD 05/30/2021, 2:32 PM

## 2021-05-30 NOTE — Progress Notes (Signed)
Physical Therapy Treatment Patient Details Name: Terry Mitchell MRN: [id number] DOB: 1951/04/06 Today's Date: 05/30/2021   History of Present Illness Terry Mitchell is a 69 y.o. male with medical history significant of metastatic prostate cancer with mets to the bones.  He has cervical myelopathy metastatic prostate cancer with mets to the spine status post chemo and radiation   And he is a DNR.  Daughter reports. he is having difficulty swallowing food or drinking liquids.   He has a history of paraplegia and he is unable to walk.Pt.was admitted to CIR at [facility] on 05/06/2021 and discharged first week of November. Patient admitted with community-acquired pneumonia/hospital-acquired pneumonia    PT Comments    The patient is awake. Max assist to sit  onto bed edge. +2 max assist to stand at [facility] , Head postures forward. Stood for < 30 seconds x 2. Assisted back intop bed, indicates pain in upper back.   PATIENT HAD MRI OF SPINE AND PT WOULD LIKE TO ADVISE IF THERE ARE ANY sPINAL RESTRICTIONS FOR MOBILITY.   Recommendations for follow up therapy are one component of a multi-disciplinary discharge planning process, led by the attending physician.  Recommendations may be updated based on patient status, additional functional criteria and insurance authorization.  Follow Up Recommendations  Skilled nursing-short term rehab (<3 hours/day)     Assistance Recommended at Discharge Frequent or constant Supervision/Assistance  Equipment Recommendations  None recommended by PT    Recommendations for Other Services       Precautions / Restrictions Precautions Precautions: Fall Precaution Comments: LUE paresis of ? etiol. mets to spine progressing Restrictions Weight Bearing Restrictions: No     Mobility  Bed Mobility   Bed Mobility: Rolling;Supine to Sit;Sit to Supine Rolling: Mod assist   Supine to sit: HOB elevated;Mod assist Sit to supine: Max assist;+2 for physical assistance    General bed mobility comments: assist legs and trunk , patient able to move legs a little to bed edge, max to place legs back onto bed    Transfers     Transfers: Sit to/from Stand Sit to Stand: +2 physical assistance;+2 safety/equipment;From elevated surface;Max assist           General transfer comment: Denna Haggard placed in front of patient, assisted placing legs onto platform and assist LUE onto crossbar, able to grip. . With bed raised, max assist of 2 to stand, head forward flexed. Stood partially x ~ 30 secs x 2    Ambulation/Gait                   Stairs             Wheelchair Mobility    Modified Rankin (Stroke Patients Only)       Balance Overall balance assessment: Needs assistance Sitting-balance support: Bilateral upper extremity supported;Feet supported Sitting balance-Leahy Scale: Poor Sitting balance - Comments: sits at midline , then begins to lean posterior Postural control: Posterior lean   Standing balance-Leahy Scale: Poor Standing balance comment: standing in Clarise Cruz stedy                            Cognition Arousal/Alertness: Awake/alert Behavior During Therapy: Flat affect Overall Cognitive Status: No family/caregiver present to determine baseline cognitive functioning Area of Impairment: Orientation;Attention;Memory;Safety/judgement                 Orientation Level: Place;Time Current Attention Level: Sustained Memory: Decreased short-term memory  Safety/Judgement: Decreased awareness of deficits              Exercises General Exercises - Lower Extremity Long Arc Quad: AROM;Seated;Both;10 reps    General Comments        Pertinent Vitals/Pain Faces Pain Scale: Hurts even more Pain Location: left arm and back when returned to supine Pain Descriptors / Indicators: Discomfort;Moaning;Grimacing;Guarding Pain Intervention(s): Monitored during session;Repositioned    Home Living                           Prior Function            PT Goals (current goals can now be found in the care plan section) Progress towards PT goals: Progressing toward goals    Frequency    Min 2X/week      PT Plan Current plan remains appropriate    Co-evaluation              AM-PAC PT "6 Clicks" Mobility   Outcome Measure  Help needed turning from your back to your side while in a flat bed without using bedrails?: Total Help needed moving from lying on your back to sitting on the side of a flat bed without using bedrails?: Total Help needed moving to and from a bed to a chair (including a wheelchair)?: Total Help needed standing up from a chair using your arms (e.g., wheelchair or bedside chair)?: Total Help needed to walk in hospital room?: Total Help needed climbing 3-5 steps with a railing? : Total 6 Click Score: 6    End of Session Equipment Utilized During Treatment: Gait belt Activity Tolerance: Patient limited by fatigue;Patient limited by pain Patient left: in bed;with call bell/phone within reach;with bed alarm set Nurse Communication: Mobility status;Need for lift equipment PT Visit Diagnosis: Unsteadiness on feet (R26.81);Adult, failure to thrive (R62.7)     Time: 2707-8675 PT Time Calculation (min) (ACUTE ONLY): 20 min  Charges:  $Therapeutic Activity: 8-22 mins                     Tresa Endo PT Acute Rehabilitation Services Pager [phone] Office [phone]    Claretha Cooper 05/30/2021, 11:23 AM

## 2021-05-30 NOTE — Care Management Important Message (Signed)
Important Message  Patient Details IM Letter placed in Patients room for daughter [name]. Name: Terry Mitchell MRN: [id number] Date of Birth: [birth year]   Medicare Important Message Given:  Yes     Kerin Salen 05/30/2021, 11:40 AM

## 2021-05-31 DIAGNOSIS — Z7189 Other specified counseling: Secondary | ICD-10-CM

## 2021-05-31 DIAGNOSIS — E876 Hypokalemia: Secondary | ICD-10-CM | POA: Diagnosis not present

## 2021-05-31 DIAGNOSIS — J189 Pneumonia, unspecified organism: Secondary | ICD-10-CM | POA: Diagnosis not present

## 2021-05-31 DIAGNOSIS — R531 Weakness: Secondary | ICD-10-CM

## 2021-05-31 DIAGNOSIS — Z515 Encounter for palliative care: Secondary | ICD-10-CM

## 2021-05-31 LAB — CBC
HCT: 27.3 % — ABNORMAL LOW (ref 39.0–52.0)
Hemoglobin: 8.4 g/dL — ABNORMAL LOW (ref 13.0–17.0)
MCH: 30.9 pg (ref 26.0–34.0)
MCHC: 30.8 g/dL (ref 30.0–36.0)
MCV: 100.4 fL — ABNORMAL HIGH (ref 80.0–100.0)
Platelets: 107 10*3/uL — ABNORMAL LOW (ref 150–400)
RBC: 2.72 MIL/uL — ABNORMAL LOW (ref 4.22–5.81)
RDW: 18.6 % — ABNORMAL HIGH (ref 11.5–15.5)
WBC: 4.1 10*3/uL (ref 4.0–10.5)
nRBC: 0 % (ref 0.0–0.2)

## 2021-05-31 LAB — CULTURE, BLOOD (ROUTINE X 2)
Culture: NO GROWTH
Culture: NO GROWTH
Special Requests: ADEQUATE

## 2021-05-31 LAB — BASIC METABOLIC PANEL
Anion gap: 7 (ref 5–15)
BUN: 26 mg/dL — ABNORMAL HIGH (ref 8–23)
CO2: 26 mmol/L (ref 22–32)
Calcium: 9.3 mg/dL (ref 8.9–10.3)
Chloride: 109 mmol/L (ref 98–111)
Creatinine, Ser: 1.13 mg/dL (ref 0.61–1.24)
GFR, Estimated: >60 mL/min (ref >60)
Glucose, Bld: 104 mg/dL — ABNORMAL HIGH (ref 70–99)
Potassium: 3.1 mmol/L — ABNORMAL LOW (ref 3.5–5.1)
Sodium: 142 mmol/L (ref 135–145)

## 2021-05-31 MED ORDER — POTASSIUM CHLORIDE CRYS ER 20 MEQ PO TBCR
40.0000 meq | EXTENDED_RELEASE_TABLET | ORAL | Status: AC
  Administered 2021-05-31 (×2): 40 meq via ORAL
  Filled 2021-05-31 (×2): qty 2

## 2021-05-31 MED ORDER — ALUM & MAG HYDROXIDE-SIMETH 200-200-20 MG/5ML PO SUSP
15.0000 mL | ORAL | Status: DC | PRN
  Administered 2021-05-31: 15 mL via ORAL
  Filled 2021-05-31: qty 30

## 2021-05-31 MED ORDER — OXYCODONE HCL 5 MG PO TABS
10.0000 mg | ORAL_TABLET | ORAL | Status: DC | PRN
  Administered 2021-05-31 – 2021-06-09 (×28): 10 mg via ORAL
  Filled 2021-05-31 (×29): qty 2

## 2021-05-31 NOTE — Consult Note (Signed)
Consultation Note Date: 05/31/2021   Patient Name: Terry Mitchell  DOB: 1951-07-18  MRN: [id number]  Age / Sex: 70 y.o., male  PCP: [name], MD Referring Physician: Mariel Aloe, MD  Reason for Consultation: Establishing goals of care  HPI/Patient Profile: 70 y.o. male   admitted on 05/26/2021    Clinical Assessment and Goals of Care: Patient is a 70 year old gentleman with medical history of metastatic prostate cancer, metastases to bones, finished radiation, has been followed by outpatient palliative services.  Patient has cervical myelopathy and is status post chemo and radiation.  Patient has had difficulty swallowing food and this is deemed likely secondary to radiation induced injury to his esophagus.  Patient has had generalized weakness, history of paraplegia unable to walk.  Patient was found to have DVT in October IVC filter was placed. Patient has been admitted to hospital medicine service with community-acquired pneumonia, acute kidney injury with hypokalemia that resolved with IV fluids.  Speech therapy was consulted and dysphagia 2 diet was recommended.  Patient has paraplegia secondary to spinal metastases. Palliative consultation for goals of care discussions has been requested. Patient is awake alert resting in bed.  Not able to move his lower extremities.  Introduced myself and palliative care as follows: Palliative medicine is specialized medical care for people living with serious illness. It focuses on providing relief from the symptoms and stress of a serious illness. The goal is to improve quality of life for both the patient and the family. Goals of care: Broad aims of medical therapy in relation to the patient's values and preferences. Our aim is to provide medical care aimed at enabling patients to achieve the goals that matter most to them, given the circumstances of their  particular medical situation and their constraints.  Goals wishes and values important to the patient attempted to be explored.  Discussed about his current condition.  Brief life review also performed.  Patient states that he used to work for a [employer] in [place], [place].  He is aware of the serious nature of his illness.  I brought up hospice philosophy of care.  Patient refuses.  He states that he has revoked hospice in the past.  He states that hospice is very you go to die.  Gentle introduction given about how hospice can benefit him in the future was given and the patient was receptive to that.  Further recommendations as listed below.  Thank you for the consult.  NEXT OF KIN Daughter [name].  SUMMARY OF RECOMMENDATIONS   Agree with DNR Goals of care discussions held with the patient: He elects for continuing search for skilled nursing facility for rehabilitation attempt, is accepting of palliative support at his rehab facility.  At this time, patient's goals are not towards hospice.  He revoked hospice a few months ago. Code Status/Advance Care Planning: DNR   Symptom Management:     Palliative Prophylaxis:  Delirium Protocol   Psycho-social/Spiritual:  Desire for further Chaplaincy support:yes Additional Recommendations: Caregiving  Support/Resources  Prognosis:  Unable to determine  Discharge Planning: [place] for rehab with Palliative care service follow-up      Primary Diagnoses: Present on Admission:  CAP (community acquired pneumonia)   I have reviewed the medical record, interviewed the patient and family, and examined the patient. The following aspects are pertinent.  Past Medical History:  Diagnosis Date   Depression    recently went on disability d/t diagnosis   History of recent fall 01/2018   missed the last step on ladder, causing back discomfort   Hypertension    Prostate cancer ([place]) 12/2017   prostate   Social  History   Socioeconomic History   Marital status: Single    Spouse name: Not on file   Number of children: 4   Years of education: Not on file   Highest education level: Not on file  Occupational History   Occupation: disability  Tobacco Use   Smoking status: Former    Packs/day: 2.00    Types: Cigars, Cigarettes    Quit date: 07/24/2019    Years since quitting: 1.8   Smokeless tobacco: Never   Tobacco comments:    2 cigars  Vaping Use   Vaping Use: Never used  Substance and Sexual Activity   Alcohol use: Yes    Alcohol/week: 9.0 standard drinks    Types: 9 Cans of beer per week    Comment: occasional   Drug use: Yes    Types: Cocaine, "Crack" cocaine    Comment: none within the year   Sexual activity: Not Currently  Other Topics Concern   Not on file  Social History Narrative   Not on file   Social Determinants of Health   Financial Resource Strain: Not on file  Food Insecurity: Not on file  Transportation Needs: Not on file  Physical Activity: Not on file  Stress: Not on file  Social Connections: Not on file   Family History  Problem Relation Age of Onset   Stroke Mother    Kidney failure Mother    Diabetes Maternal Aunt    Scheduled Meds:  (feeding supplement) PROSource Plus  30 mL Oral BID BM   amLODipine  7.5 mg Oral Daily   apixaban  5 mg Oral BID   feeding supplement  237 mL Oral BID BM   furosemide  20 mg Intravenous Daily   magic mouthwash w/lidocaine  5 mL Oral QID   multivitamin with minerals  1 tablet Oral Daily   sucralfate  1 g Oral TID WC & HS   Continuous Infusions: PRN Meds:.alum & mag hydroxide-simeth, oxyCODONE Medications Prior to Admission:  Prior to Admission medications   Medication Sig Start Date End Date Taking? Authorizing Provider  amLODipine (NORVASC) 5 MG tablet Take 1.5 tablets (7.5 mg total) by mouth daily. Patient taking differently: Take 5 mg by mouth daily. 01/12/21  Yes Love, Ivan Anchors, PA-C  atorvastatin (LIPITOR) 40  MG tablet Take 40 mg by mouth daily. 05/22/21  Yes [provider]  DULoxetine (CYMBALTA) 60 MG capsule Take 60 mg by mouth daily. 05/22/21  Yes [provider]  ELIQUIS 5 MG TABS tablet Take 5 mg by mouth 2 (two) times daily. 05/22/21  Yes [provider]  FEROSUL 325 (65 Fe) MG tablet Take 325 mg by mouth every other day. 03/14/21  Yes [provider]  morphine (MSIR) 15 MG tablet Take 15 mg by mouth 2 (two) times daily.   Yes [provider]  prednisoLONE 5 MG TABS tablet Take 5 mg by mouth daily.   Yes [provider]  acetaminophen (TYLENOL) 325 MG tablet Take 1-2 tablets (325-650 mg total) by mouth every 4 (four) hours as needed for mild pain. Patient not taking: No sig reported 01/12/21   Love, Ivan Anchors, PA-C  albuterol (VENTOLIN HFA) 108 (90 Base) MCG/ACT inhaler Inhale 2 puffs into the lungs every 6 (six) hours as needed for wheezing or shortness of breath. Patient not taking: No sig reported 05/22/19   [name], MD  benzonatate (TESSALON) 100 MG capsule Take 1 capsule (100 mg total) by mouth 3 (three) times daily as needed for cough. Patient not taking: No sig reported 01/12/21   Love, Ivan Anchors, PA-C  dexamethasone (DECADRON) 2 MG tablet Take 1 tablet (2 mg total) by mouth daily. Patient not taking: No sig reported 01/13/21   Love, Ivan Anchors, PA-C  enoxaparin (LOVENOX) 40 MG/0.4ML injection Inject 0.4 mLs (40 mg total) into the skin daily. Patient not taking: No sig reported 01/13/21   Love, Ivan Anchors, PA-C  gabapentin (NEURONTIN) 300 MG capsule Take 300 mg by mouth 2 (two) times daily. Patient not taking: No sig reported 03/10/21   [provider]  HYDROmorphone (DILAUDID) 2 MG tablet Take 1 tablet (2 mg total) by mouth every 6 (six) hours as needed for up to 12 doses for severe pain. Patient not taking: No sig reported 03/19/21   Wyvonnia Dusky, MD  ondansetron (ZOFRAN) 4 MG tablet Take 2 tablets (8 mg total) by mouth every 8  (eight) hours as needed for nausea or vomiting. 01/12/21   Love, Ivan Anchors, PA-C  oxyCODONE-acetaminophen (PERCOCET) 5-325 MG tablet Take 1-2 tablets by mouth every 6 (six) hours as needed for severe pain. Patient not taking: No sig reported 01/12/21 01/12/22  Love, Ivan Anchors, PA-C  polyethylene glycol (MIRALAX / GLYCOLAX) 17 g packet Take 17 g by mouth 2 (two) times daily. Patient not taking: No sig reported 01/12/21   Bary Leriche, PA-C  vitamin B-12 (CYANOCOBALAMIN) 1000 MCG tablet Take 1 tablet (1,000 mcg total) by mouth daily. Patient not taking: No sig reported 01/12/21   Love, Ivan Anchors, PA-C  prochlorperazine (COMPAZINE) 10 MG tablet Take 1 tablet (10 mg total) by mouth every 6 (six) hours as needed (Nausea or vomiting). Patient not taking: Reported on 11/02/2019 05/01/19 04/08/20  [name], MD   Allergies  Allergen Reactions   Penicillins Hives and Other (See Comments)    Hives, welts, swelling profusely . Pretty severe reaction Has patient had a PCN reaction causing immediate rash, facial/tongue/throat swelling, SOB or lightheadedness with hypotension: No Has patient had a PCN reaction causing severe rash involving mucus membranes or skin necrosis: Yes Has patient had a PCN reaction that required hospitalization: Yes Has patient had a PCN reaction occurring within the last 10 years: No If all of the above answers are "NO", then may proceed with Cephalosporin use.    Lactose Intolerance (Gi) Other (See Comments)    Gi upset   Review of Systems +weakness +pain in shoulder and neck area  Physical Exam Awake alert oriented resting in bed Has dry skin upper extremities Has 2+ pitting edema lower extremities Paraplegic S1-S2 Regular breath sounds Awake alert oriented, verbalizes a little slowly   Vital Signs: BP 124/71 (BP Location: Right Arm)   Pulse 97   Temp 98 F (36.7 C) (Oral)   Resp 18   Ht 6' (1.829 m)   [facility] [facility]  89 kg   SpO2 97%   BMI 26.61 kg/m  Pain Scale: 0-10   Pain  Score: 5    SpO2: SpO2: 97 % O2 Device:SpO2: 97 % O2 Flow Rate: .   IO: Intake/output summary:  Intake/Output Summary (Last 24 hours) at 05/31/2021 1543 Last data filed at 05/31/2021 1358 Gross per 24 hour  Intake 1261.67 ml  Output 2100 ml  Net -838.33 ml    LBM: Last BM Date:  (Pt states that he does not know) Baseline Weight: Weight: 89 kg Most recent weight: Weight: 89 kg     Palliative Assessment/Data:   PPS 50%  Time In:  1430 Time Out:  1530 Time Total:  60  Greater than 50%  of this time was spent counseling and coordinating care related to the above assessment and plan.  Signed by: Loistine Chance, MD   Please contact Palliative Medicine Team phone at [phone] for questions and concerns.  For individual provider: See Shea Evans

## 2021-05-31 NOTE — Progress Notes (Signed)
PROGRESS NOTE    Terry Mitchell  TDV:[id number] DOB: Dec 11, 1950 DOA: 05/26/2021 PCP: [name], MD   Brief Narrative: Terry Mitchell is a 70 y.o. male with medical history significant of metastatic prostate cancer with mets to the bones.  He had finished his radiation therapy approximately a week ago. Patient presented secondary to generalized weakness and decreased oral intake. On admission, he was found to have evidence of pneumonia.   Assessment & Plan:   Active Problems:   CAP (community acquired pneumonia)   Elevated serum creatinine   Community acquired pneumonia Chest x-ray consistent with right sided infiltrate concerning for pneumonia. Started empirically on Levaquin. Last dose today.  AKI Baseline creatinine of about 1.2-1.3. Creatinine of 3.27 on admission. Resolved with IV fluids.  Hypokalemia -Replete  Hypercalcemia In setting of malignancy. Resolved with IV fluids although corrected calcium appears to be elevated. Asymptomatic. -Renal function panel  Pancytopenia Transient leukopenia. Now resolved.  Acute on chronic anemia Baseline hemoglobin of about 10-11. Hemoglobin of 7.5 on admission with decrease to 6.4 after IV fluids. Patient received 1 unit of PRBC  on 11/4 and is now stable.  Thrombocytopenia Chronic and stable.  Elevated alkaline phosphatase Secondary to bony metastatic disease.  Metastatic prostate cancer Spine metastasis Patient follows at [facility]. History of chemo/radiation. Previously on hospice, now revoked. Palliative care consulted this admission. -Palliative care recommendations: outpatient palliative care  Dysphagia Presumed secondary to radiation induced esophagitis. -Continue Magic mouthwash -Dysphagia 2 diet  Paraplegia Secondary to metastatic disease to spine. -PT/OT  Left peroneal/popliteal DVT History of IVC filter placement at [facility]. On Eliquis -Continue Eliquis   DVT prophylaxis: Eliquis Code  Status:   Code Status: DNR Family Communication: None at bedside Disposition Plan: Discharge to SNF when bed is available   Consultants:  Palliative care  Procedures:  BLOOD TRANSFUSION; 1 UNIT (11/4)  Antimicrobials: Levaquin    Subjective: No issues this morning.  Objective: Vitals:   05/30/21 1355 05/30/21 2132 05/31/21 0537 05/31/21 1328  BP: 127/77 (!) 141/91 (!) 160/71 124/71  Pulse: (!) 102 (!) 110 (!) 106 97  Resp: 18 18 18 18   Temp: 98.3 F (36.8 C) 99.1 F (37.3 C) 98.3 F (36.8 C) 98 F (36.7 C)  TempSrc:    Oral  SpO2: 97% 97% 97% 97%  Weight:      Height:        Intake/Output Summary (Last 24 hours) at 05/31/2021 1534 Last data filed at 05/31/2021 1358 Gross per 24 hour  Intake 1261.67 ml  Output 2100 ml  Net -838.33 ml   Filed Weights   05/26/21 1349  Weight: 89 kg    Examination:  General exam: Appears calm and comfortable Respiratory system: Clear to auscultation. Respiratory effort normal. Cardiovascular system: S1 & S2 heard, RRR. Gastrointestinal system: Abdomen is nondistended, soft and nontender. No organomegaly or masses felt. Normal bowel sounds heard. Central nervous system: Alert and oriented. No focal neurological deficits. Musculoskeletal: BLE and left upper extremity edema. No calf tenderness Skin: No cyanosis. Psychiatry: Judgement and insight appear normal. Mood & affect appropriate.     Data Reviewed: I have personally reviewed following labs and imaging studies  CBC Lab Results  Component Value Date   WBC 4.1 05/31/2021   RBC 2.72 (L) 05/31/2021   HGB 8.4 (L) 05/31/2021   HCT 27.3 (L) 05/31/2021   MCV 100.4 (H) 05/31/2021   MCH 30.9 05/31/2021   PLT 107 (L) 05/31/2021   MCHC 30.8  05/31/2021   RDW 18.6 (H) 05/31/2021   LYMPHSABS 0.3 (L) 05/26/2021   MONOABS 0.4 05/26/2021   EOSABS 0.0 05/26/2021   BASOSABS 0.0 67/20/9470     Last metabolic panel Lab Results  Component Value Date   NA 142 05/31/2021   K  3.1 (L) 05/31/2021   CL 109 05/31/2021   CO2 26 05/31/2021   BUN 26 (H) 05/31/2021   CREATININE 1.13 05/31/2021   GLUCOSE 104 (H) 05/31/2021   GFRNONAA >60 05/31/2021   GFRAA 50 (L) 04/06/2020   CALCIUM 9.3 05/31/2021   PHOS 3.7 12/14/2020   PROT 4.8 (L) 05/28/2021   ALBUMIN 2.4 (L) 05/28/2021   BILITOT 1.0 05/28/2021   ALKPHOS 500 (H) 05/28/2021   AST 36 05/28/2021   ALT 10 05/28/2021   ANIONGAP 7 05/31/2021    CBG (last 3)  No results for input(s): GLUCAP in the last 72 hours.   GFR: Estimated Creatinine Clearance: 68.7 mL/min (by C-G formula based on SCr of 1.13 mg/dL).  Coagulation Profile: No results for input(s): INR, PROTIME in the last 168 hours.  Recent Results (from the past 240 hour(s))  Culture, blood (Routine X 2) w Reflex to ID Panel     Status: None   Collection Time: 05/26/21  2:18 PM   Specimen: BLOOD  Result Value Ref Range Status   Specimen Description   Final    BLOOD RIGHT ANTECUBITAL Performed at [facility] [address], [place], [place] [address]    Special Requests   Final    BOTTLES DRAWN AEROBIC AND ANAEROBIC Blood Culture results may not be optimal due to an excessive volume of blood received in culture bottles Performed at [facility] [address] [place], [place] [address]    Culture   Final    NO GROWTH 5 DAYS Performed at [facility], [place] [address], [place], [place] [address]    Report Status 05/31/2021 FINAL  Final  Culture, blood (Routine X 2) w Reflex to ID Panel     Status: None   Collection Time: 05/26/21  2:23 PM   Specimen: BLOOD  Result Value Ref Range Status   Specimen Description   Final    BLOOD LEFT ANTECUBITAL Performed at [facility] [address], [place], [place] [address]    Special Requests   Final    BOTTLES DRAWN AEROBIC AND ANAEROBIC Blood Culture adequate volume Performed at [facility] [address]  [place], [place] [address]    Culture   Final    NO GROWTH 5 DAYS Performed at [facility], [place] [address], [place], [place] [address]    Report Status 05/31/2021 FINAL  Final  Resp Panel by RT-PCR (Flu A&B, Covid) Nasopharyngeal Swab     Status: None   Collection Time: 05/26/21  3:30 PM   Specimen: Nasopharyngeal Swab; Nasopharyngeal(NP) swabs in vial transport medium  Result Value Ref Range Status   SARS Coronavirus 2 by RT PCR NEGATIVE NEGATIVE Final    Comment: (NOTE) SARS-CoV-2 target nucleic acids are NOT DETECTED.  The SARS-CoV-2 RNA is generally detectable in upper respiratory specimens during the acute phase of infection. The lowest concentration of SARS-CoV-2 viral copies this assay can detect is 138 copies/mL. A negative result does not preclude SARS-Cov-2 infection and should not be used as the sole basis for treatment or other patient management decisions. A negative result may occur with  improper specimen collection/handling, submission  of specimen other than nasopharyngeal swab, presence of viral mutation(s) within the areas targeted by this assay, and inadequate number of viral copies(<138 copies/mL). A negative result must be combined with clinical observations, patient history, and epidemiological information. The expected result is Negative.  Fact Sheet for Patients:  EntrepreneurPulse.com.au  Fact Sheet for Healthcare Providers:  IncredibleEmployment.be  This test is no t yet approved or cleared by the [place] FDA and  has been authorized for detection and/or diagnosis of SARS-CoV-2 by FDA under an Emergency Use Authorization (EUA). This EUA will remain  in effect (meaning this test can be used) for the duration of the COVID-19 declaration under Section 564(b)(1) of the Act, 21 U.S.C.section 360bbb-3(b)(1), unless the authorization is terminated  or revoked sooner.       Influenza A by PCR NEGATIVE NEGATIVE  Final   Influenza B by PCR NEGATIVE NEGATIVE Final    Comment: (NOTE) The Xpert Xpress SARS-CoV-2/FLU/RSV plus assay is intended as an aid in the diagnosis of influenza from Nasopharyngeal swab specimens and should not be used as a sole basis for treatment. Nasal washings and aspirates are unacceptable for Xpert Xpress SARS-CoV-2/FLU/RSV testing.  Fact Sheet for Patients: EntrepreneurPulse.com.au  Fact Sheet for Healthcare Providers: IncredibleEmployment.be  This test is not yet approved or cleared by the [place] FDA and has been authorized for detection and/or diagnosis of SARS-CoV-2 by FDA under an Emergency Use Authorization (EUA). This EUA will remain in effect (meaning this test can be used) for the duration of the COVID-19 declaration under Section 564(b)(1) of the Act, 21 U.S.C. section 360bbb-3(b)(1), unless the authorization is terminated or revoked.  Performed at [facility], [place] [address] [place], [place] [address]         Radiology Studies: MR CERVICAL SPINE W WO CONTRAST  Result Date: 05/30/2021 CLINICAL DATA:  Initial evaluation for metastatic prostate cancer. Paraplegia. EXAM: MRI CERVICAL SPINE WITHOUT AND WITH CONTRAST TECHNIQUE: Multiplanar and multiecho pulse sequences of the cervical spine, to include the craniocervical junction and cervicothoracic junction, were obtained without and with intravenous contrast. CONTRAST:  12mL GADAVIST GADOBUTROL 1 MMOL/ML IV SOLN COMPARISON:  None available. FINDINGS: Alignment: Reversal of the normal cervical lordosis, apex at C6. No listhesis. Vertebrae: Abnormal signal intensity seen diffusely throughout the visualized osseous structures, consistent with osseous metastatic disease. There is likely involvement of all levels. Superimposed marrow edema and enhancement at C4 through C7 suspected to reflect superimposed radiation changes. Height loss involving the C5,  C6, and C7 vertebral bodies noted, favored to be chronic and degenerative. Vertebral bodies are partially ankylosed at these levels. Vertebral body height otherwise maintained with no other pathologic fracture. Probable subtle extraosseous extension with involvement of the ventral epidural space at approximately C4 through C6. Epidural extension into the left neural foramina of C4-5 through C7-T1. Probable epidural extension into the right neural foramina at C6-7 and C7-T1. Cord: Normal signal and morphology. No cord signal changes to suggest myelopathy. Posterior Fossa, vertebral arteries, paraspinal tissues: Visualized brain and posterior fossa within normal limits. Craniocervical junction normal. Diffuse soft tissue swelling with edema and layering retropharyngeal effusion seen involving the prevertebral/retropharyngeal soft tissues, most pronounced at C4-5. Additional edema within the left greater than right paraspinous and soft tissues of the neck. Findings likely reflect post radiation changes. No loculated collections. Normal flow voids seen within the vertebral arteries bilaterally. Disc levels: C2-C3: Mild degenerative intervertebral disc space narrowing with disc desiccation. Right greater than left uncovertebral spurring without significant disc  bulge. Severe right with mild-to-moderate left facet arthrosis. No significant spinal stenosis. Severe right C3 foraminal narrowing. Left neural foramen remains patent. C3-C4: Degenerative intervertebral disc space narrowing. Central disc protrusion indents the ventral thecal sac, contacting and mildly flattening the ventral cord. Associated annular fissure. Severe right with moderate left facet arthrosis. Mild bilateral uncovertebral spurring. Moderate spinal stenosis with severe bilateral C4 foraminal narrowing. C4-C5: Degenerative intervertebral disc space narrowing. Broad-based central disc osteophyte complex indents and largely effaces the ventral thecal sac.  Mild cord flattening without cord signal changes. Moderate right worse than left facet hypertrophy. Moderate spinal stenosis. Moderate right C5 foraminal narrowing. Probable tumor extension into the left neural foramen with moderate left foraminal stenosis. C5-C6: Advanced degenerative intervertebral disc space narrowing with diffuse disc osteophyte complex. C5 and C6 vertebral bodies are partially ankylosed anteriorly. Flattening and partial effacement of the ventral thecal sac, asymmetric to the right. Mild cord flattening without cord signal changes. Moderate spinal stenosis. Right worse than left uncovertebral spurring with resultant moderate right C6 foraminal narrowing. Tumor extension into the left neural foramen with moderate to severe left foraminal stenosis. C6-C7: Advanced degenerative intervertebral disc space narrowing with diffuse disc osteophyte complex. C6 and C7 vertebral bodies are partially ankylosed. Broad posterior component flattens and effaces the ventral thecal sac, asymmetric to the right. Mild cord flattening without cord signal changes. Moderate spinal stenosis. Superimposed uncovertebral hypertrophy with tumor extension into the bilateral neural foramina. Resultant fairly severe bilateral foraminal stenosis. C7-T1: Moderate degenerative intervertebral disc space narrowing with diffuse disc bulge, asymmetric to the right. Superimposed endplate and uncovertebral spurring. Probable tumor extension into the right greater than left neural foramina. Mild to moderate spinal stenosis. Severe right worse than left C8 foraminal narrowing. IMPRESSION: 1. Diffuse osseous metastatic disease throughout the cervical spine. 2. Probable subtle extraosseous extension of tumor into the ventral epidural space at C4 through C6, with additional epidural involvement into the left neural foramina at C4-5 through C7-T1, and the right neural foramina at C6-7 and C7-T1. 3. Superimposed edema and enhancement  involving the C4 through C7 vertebral bodies, suspected to reflect post radiation changes. Similarly, prevertebral edema and effusion also suspected to reflect post treatment changes. Correlation with history recommended. 4. Underlying advanced multilevel degenerative spondylosis with resultant moderate diffuse spinal stenosis at C3-4 through C6-7. No cord signal changes to suggest myelopathy. Associated moderate to severe bilateral C3 through C8 foraminal narrowing as above. Electronically Signed   By: Jeannine Boga M.D.   On: 05/30/2021 02:57   MR THORACIC SPINE W WO CONTRAST  Result Date: 05/30/2021 CLINICAL DATA:  Initial evaluation for metastatic prostate cancer, status post chemo radiation. EXAM: MRI THORACIC WITHOUT AND WITH CONTRAST TECHNIQUE: Multiplanar and multiecho pulse sequences of the thoracic spine were obtained without and with intravenous contrast. CONTRAST:  104mL GADAVIST GADOBUTROL 1 MMOL/ML IV SOLN COMPARISON:  Previous MRI from 12/11/2020. FINDINGS: Alignment: Examination degraded by motion. Physiologic with preservation of the normal thoracic kyphosis. No interval listhesis or malalignment. Vertebrae: Widespread osseous metastatic disease again seen throughout the thoracic spine. Vertebral body height maintained without interval pathologic fracture. Previously seen extraosseous extension of tumor at the level of T6 is improved, with only a small amount of residual epidural tumor now seen at this level. Mild epidural extension into the right ventral epidural space at the levels of T3 and T4, similar to previous. Now seen is somewhat smooth diffuse enhancement involving both the ventral and dorsal epidural space extending from the cervicothoracic junction through T6 (series  52, image 11). Given the relatively smooth morphology of this finding, this is suspected to in large part reflect post radiation changes, although a small amount of superimposed epidural tumor is likely present as  well, most pronounced within the ventral epidural space at the levels of T3 and T4. Progressive epidural lipomatosis also noted within the upper thoracic spine. There is progressive mild to moderate spinal stenosis at the level of T2-3 with minimal cord flattening, but no cord signal changes (series 41, image 7). Osseous expansion and/or early epidural extension of tumor into multiple right-sided neural foramina noted as well, most pronounced at T6-7 (series 51, image 9). Similar but less pronounced changes seen about the left neural foramina, most pronounced at T3-4 (series 51, image 15). Cord: Patchy T2 signal abnormality seen within the thoracic cord at the level of T6 at site of previously seen cord compression, likely reflecting myelomalacia (series 51, image 12). No other convincing cord signal changes seen on this motion degraded exam. Progressive mild to moderate spinal stenosis at the level of T2-3 as above. No other significant stenosis seen within the thoracic spine. Paraspinal and other soft tissues: Large layering bilateral pleural effusions noted. Paraspinous soft tissues demonstrate no other acute finding. Disc levels: Underlying mild for age spondylosis, not significantly changed or progressed from prior. No other significant spinal stenosis within the thoracic spine. IMPRESSION: 1. Widespread osseous metastatic disease throughout the thoracic spine. No interval pathologic fracture. 2. Interval improvement in previously seen epidural tumor at T6, consistent with response to therapy. No significant residual spinal stenosis at this level. Patchy cord signal abnormality at this level most likely reflects myelomalacia given the prior stenosis. 3. New fairly smooth epidural enhancement extending from the cervicothoracic junction through T6, favored to in large part reflect post radiation changes, although there is undoubtedly a small amount of superimposed epidural tumor at a few levels as above.  Progressive mild to moderate spinal stenosis at the level of T2-3. Attention at follow-up recommended. 4. Large layering bilateral pleural effusions. Electronically Signed   By: Jeannine Boga M.D.   On: 05/30/2021 04:54   MR Lumbar Spine W Wo Contrast  Result Date: 05/30/2021 CLINICAL DATA:  Initial evaluation for metastatic disease. EXAM: MRI LUMBAR SPINE WITHOUT AND WITH CONTRAST TECHNIQUE: Multiplanar and multiecho pulse sequences of the lumbar spine were obtained without and with intravenous contrast. CONTRAST:  59mL GADAVIST GADOBUTROL 1 MMOL/ML IV SOLN COMPARISON:  Prior MRI from 12/11/2020. FINDINGS: Segmentation: Standard. Same numbering system employed as on previous exam. Alignment: Moderate dextroscoliosis. No interval listhesis or malalignment. Vertebrae: Susceptibility artifact related to prior posterior fusion at L4 through S1. Underlying widespread osseous metastatic disease again seen, essentially involving all levels as well as the visualized sacrum and pelvis. Vertebral body height maintained without interval pathologic fracture. Interval Schmorl's node deformity at the inferior endplate of Z02 noted. Diffusely decreased T1 signal intensity within the underlying bone marrow, progressed from prior, and likely reflecting post treatment changes. No significant epidural extension of tumor within the lumbar spine. Osseous expansion about multiple pedicles with associated mild to moderate foraminal narrowing at multiple levels, most pronounced at T10-11 through L1-2 on the right (series 1, image 7), and L3-4 on the left (series 1, image 14). Conus medullaris and cauda equina: Conus extends to the L1 level. Conus medullaris within normal limits. Visualized nerve roots of the cauda equina are unremarkable. Distal nerve roots are obscured by susceptibility artifact. Paraspinal and other soft tissues: Chronic postoperative changes noted within the  lower posterior paraspinous soft tissues. No  visible acute soft tissue abnormality. Disc levels: No significant disc pathology seen through the L3-4 level. Evaluation of the lower lumbar spine limited by extensive susceptibility artifact. No visible significant spinal stenosis. IMPRESSION: 1. Diffuse osseous metastatic disease throughout the lumbar spine. No pathologic fracture or significant epidural extension of tumor. Associated osseous expansion about multiple pedicles with progressive mild to moderate foraminal narrowing on the right at T10-11 through L1-2, and on the left at L3-4. 2. Prior posterior fusion at L4 through S1. No progressive spinal stenosis within the upper lumbar spine. Electronically Signed   By: Jeannine Boga M.D.   On: 05/30/2021 05:19        Scheduled Meds:  (feeding supplement) PROSource Plus  30 mL Oral BID BM   amLODipine  7.5 mg Oral Daily   apixaban  5 mg Oral BID   feeding supplement  237 mL Oral BID BM   furosemide  20 mg Intravenous Daily   magic mouthwash w/lidocaine  5 mL Oral QID   multivitamin with minerals  1 tablet Oral Daily   sucralfate  1 g Oral TID WC & HS   Continuous Infusions:   LOS: 5 days     Cordelia Poche, MD [facility] 05/31/2021, 3:34 PM  If 7PM-7AM, please contact night-coverage [email or web address]

## 2021-05-31 NOTE — TOC Progression Note (Signed)
Transition of Care [facility]) - Progression Note    Patient Details  Name: Terry Mitchell MRN: [id number] Date of Birth: 28-Jan-1951  Transition of Care [facility]) CM/SW Contact  Lennart Pall, [place] Phone Number: 05/31/2021, 12:47 PM  Clinical Narrative:    Pt and daughter have accepted SNF bed offer with [facility].  Insurance authorization begun. Will alert MD/ team when auth received and coordinate discharge.   Expected Discharge Plan: [place] Barriers to Discharge: Continued Medical Work up, Ship broker, SNF Pending bed offer  Expected Discharge Plan and Services Expected Discharge Plan: [place] In-house Referral: Clinical Social Work   Post Acute Care Choice: [place] Living arrangements for the past 2 months: Single Family Home                                       Social Determinants of Health (SDOH) Interventions    Readmission Risk Interventions No flowsheet data found.

## 2021-06-01 DIAGNOSIS — D696 Thrombocytopenia, unspecified: Secondary | ICD-10-CM

## 2021-06-01 DIAGNOSIS — C61 Malignant neoplasm of prostate: Secondary | ICD-10-CM | POA: Diagnosis not present

## 2021-06-01 DIAGNOSIS — G8220 Paraplegia, unspecified: Secondary | ICD-10-CM

## 2021-06-01 DIAGNOSIS — C7951 Secondary malignant neoplasm of bone: Secondary | ICD-10-CM

## 2021-06-01 DIAGNOSIS — J189 Pneumonia, unspecified organism: Secondary | ICD-10-CM | POA: Diagnosis not present

## 2021-06-01 LAB — RENAL FUNCTION PANEL
Albumin: 2.1 g/dL — ABNORMAL LOW (ref 3.5–5.0)
Anion gap: 8 (ref 5–15)
BUN: 20 mg/dL (ref 8–23)
CO2: 24 mmol/L (ref 22–32)
Calcium: 8.8 mg/dL — ABNORMAL LOW (ref 8.9–10.3)
Chloride: 108 mmol/L (ref 98–111)
Creatinine, Ser: 1.01 mg/dL (ref 0.61–1.24)
GFR, Estimated: >60 mL/min (ref >60)
Glucose, Bld: 100 mg/dL — ABNORMAL HIGH (ref 70–99)
Phosphorus: 2.3 mg/dL — ABNORMAL LOW (ref 2.5–4.6)
Potassium: 3.1 mmol/L — ABNORMAL LOW (ref 3.5–5.1)
Sodium: 140 mmol/L (ref 135–145)

## 2021-06-01 LAB — RESP PANEL BY RT-PCR (FLU A&B, COVID) ARPGX2
Influenza A by PCR: NEGATIVE
Influenza B by PCR: NEGATIVE
SARS Coronavirus 2 by RT PCR: NEGATIVE

## 2021-06-01 LAB — MAGNESIUM: Magnesium: 1.7 mg/dL (ref 1.7–2.4)

## 2021-06-01 MED ORDER — ENSURE ENLIVE PO LIQD: 237.0000 mL | Freq: Two times a day (BID) | ORAL | Status: DC

## 2021-06-01 MED ORDER — ALBUTEROL SULFATE (2.5 MG/3ML) 0.083% IN NEBU: 2.5000 mg | INHALATION_SOLUTION | RESPIRATORY_TRACT | Status: DC | PRN

## 2021-06-01 MED ORDER — OXYCODONE HCL 5 MG PO TABS
10.0000 mg | ORAL_TABLET | ORAL | 0 refills | Status: DC | PRN
Start: 2021-06-01 — End: 2022-11-16

## 2021-06-01 MED ORDER — MAGIC MOUTHWASH W/LIDOCAINE: 5.0000 mL | Freq: Four times a day (QID) | ORAL | 0 refills | Status: AC

## 2021-06-01 MED ORDER — POTASSIUM CHLORIDE CRYS ER 20 MEQ PO TBCR
40.0000 meq | EXTENDED_RELEASE_TABLET | ORAL | Status: AC
  Administered 2021-06-01 (×2): 40 meq via ORAL
  Filled 2021-06-01 (×2): qty 2

## 2021-06-01 MED ORDER — SUCRALFATE 1 GM/10ML PO SUSP: 1.0000 g | Freq: Three times a day (TID) | ORAL | Status: AC

## 2021-06-01 MED ORDER — ADULT MULTIVITAMIN W/MINERALS CH
1.0000 | ORAL_TABLET | Freq: Every day | ORAL | Status: DC
Start: 2021-06-02 — End: 2022-11-16

## 2021-06-01 MED ORDER — POTASSIUM CHLORIDE CRYS ER 20 MEQ PO TBCR: 40.0000 meq | EXTENDED_RELEASE_TABLET | Freq: Every day | ORAL | Status: DC

## 2021-06-01 MED ORDER — MORPHINE SULFATE 15 MG PO TABS: 15.0000 mg | ORAL_TABLET | Freq: Two times a day (BID) | ORAL | 0 refills | Status: DC

## 2021-06-01 MED ORDER — PROSOURCE PLUS PO LIQD
30.0000 mL | Freq: Two times a day (BID) | ORAL | Status: DC
Start: 2021-06-01 — End: 2022-11-16

## 2021-06-01 NOTE — Progress Notes (Signed)
Chaplain engaged in an initial visit with Terry Mitchell and also called Terry Mitchell's daughter, Terry Mitchell, by phone.  They both shared that they no longer wanted to do the Healthcare POA document and had been able to work out what Terry Mitchell needs and desires.  Though Terry Mitchell has other children, Terry Mitchell voiced that she is the only one making decisions and that her other siblings are absent from her father's care.  They don't desire to make any changes right now.  Chaplain provided support, listening and presence.    06/01/21 1200  Clinical Encounter Type  Visited With Patient and family together  Visit Type Initial;Social support

## 2021-06-01 NOTE — Progress Notes (Signed)
Speech Language Pathology Treatment: Dysphagia  Patient Details Name: Terry Mitchell MRN: [id number] DOB: 1951-05-18 Today's Date: 06/01/2021 Time: 1535-1600 SLP Time Calculation (min) (ACUTE ONLY): 25 min  Assessment / Plan / Recommendation Clinical Impression  Patient alert lying back in bed approximately 30. Reports severe discomfort preventing him from sitting upright and requesting to be turned to his side to remove pressure from his bottom. SLP obtained bone pillow and assisted patient to lay to his left side. RN assistance on his left side. Regarding swallowing, patient reports less discomfort in his esophagus. He does not Indicate change in swallow comfort with use of magic mouthwash. SLP treatment included observation of advanced textures to help determine readiness for diet advancement. Patient advised SLP that he is happy with current diet and this is the first day he has consumed solids. He declined dietary advancement. Patient observed consuming Sprite via straw and bites of [place] sandwich. Adequate mastication and oral clearance after liquid wash to clear trace residual on tongue base. No indication of difficulties with managing solid foods that require mastication. Patient coughing observed post swallow of approximately 80% of thin liquid swallows. He confirms coughing with intake over the last several months. Instructed patient to dysphagia/aspiration mitigation strategies, including small single boluses and sitting upright as much as he can tolerate for PO intake. Chin tuck posture tested for improvement and comfort while swallowing liquids, but unfortunately this was not consistently effective. Chin tuck actually appeared to exacerbate cough and suspect potential spillage of liquid into airway from piriform sinus. Advised patient to refrain from chin tuck with consumption of liquids. Congested cough noted and SLP encouraged patient to use his IS for pulmonary clearance. Consumption  water advised with meals due to increased tolerance if aspirated. Patient unable to have MBS due to his discomfort from sacral wound and spinal metastasis causing severe pain with sitting partially upright. Advised patient to SLP role of clinically providing mitigation strategies to minimize aspiration and maximize comfort. with intake. Patient reported understanding to information provided. SLP will follow up next date and provide patient with RMST to improve pharyngeal and laryngeal muscle contraction to maximize swallow ability and airway protection.      HPI HPI: Terry Mitchell is a 70 y.o. male history of hypertension, prostate cancer with spinal, rib, and other bony metastasis, present emergency department with chest pain and arm pain.  CXR remarkable for opacity in the R lower lobe.  MRI total spine from 04/05/21 shows diffuse metastatic disease with epidural thickening and spinal canal stenosis from C3-5 without cord signal change, and left foraminal invasion at C5-6.  Pt completed inpatient rehab with PT & OT (no ST) at [facility] from 10/15-11/01.      SLP Plan  Continue with current plan of care      Recommendations for follow up therapy are one component of a multi-disciplinary discharge planning process, led by the attending physician.  Recommendations may be updated based on patient status, additional functional criteria and insurance authorization.    Recommendations  Liquids provided via: Cup;Straw Medication Administration: Whole meds with liquid Supervision: Patient able to self feed Compensations: Slow rate;Small sips/bites Postural Changes and/or Swallow Maneuvers: Seated upright 90 degrees                Oral Care Recommendations: Oral care BID Follow Up Recommendations: No SLP follow up SLP Visit Diagnosis: Dysphagia, unspecified (R13.10) Plan: Continue with current plan of care       GO  Terry Lime, MS [facility] SLP Acute Rehab Services Office  [phone] Pager [phone]  Macario Golds  06/01/2021, 5:29 PM

## 2021-06-01 NOTE — Plan of Care (Signed)

## 2021-06-01 NOTE — TOC Progression Note (Addendum)
Transition of Care [facility]) - Progression Note    Patient Details  Name: Terry Mitchell MRN: [id number] Date of Birth: 05/17/1951  Transition of Care [facility]) CM/SW Contact  Lennart Pall, LCSW Phone Number: 06/01/2021, 1:23 PM  Clinical Narrative:    SNF bed was accepted at [place] and insurance auth received.  Pt was to dc today, however, just notified by facility that background check on patient revealed past criminal charges and the bed offer rescinded.  Will speak with pt and daughter about need to coordinate home dc as this will be an issue for all facilities.  MD aware. PTAR cancelled.  ADDENDUM:  Spoke with daughter about need to make arrangements for pt to dc home.  Daughter is upset with situation but understands that the charges will be a barrier to admit to any SNF.   She is concerned that the home will not accommodate a hospital bed (and possible hoyer lift).  Will ask PT to make specific DME recommendations.  Daughter to speak with her spouse and to get back with me ASAP.    Expected Discharge Plan: [place] Barriers to Discharge: Continued Medical Work up, Ship broker, SNF Pending bed offer  Expected Discharge Plan and Services Expected Discharge Plan: [place] In-house Referral: Clinical Social Work   Post Acute Care Choice: [place] Living arrangements for the past 2 months: Single Family Home Expected Discharge Date: 06/01/21                                     Social Determinants of Health (SDOH) Interventions    Readmission Risk Interventions No flowsheet data found.

## 2021-06-01 NOTE — Discharge Summary (Signed)
Physician Discharge Summary  Terry Mitchell EUM:[id number] DOB: 11/09/1950 DOA: 05/26/2021  PCP: [name], MD  Admit date: 05/26/2021 Discharge date: 06/01/2021  Admitted From: Home Disposition: SNF  Recommendations for Outpatient Follow-up:  Follow up with PCP in 1 week Follow up with outpatient palliative care Please obtain BMP/CBC in one week Please follow up on the following pending results: None   Discharge Condition: Stable CODE STATUS: DNR Diet recommendation: Dysphagia 2   Brief/Interim Summary:  Admission HPI written by Georgette Shell, MD   HPI: Terry Mitchell is a 70 y.o. male with medical history significant of metastatic prostate cancer with mets to the bones.  He had finished his radiation therapy approximately a week ago.  History obtained from patient's daughter.  Patient also followed by a thorough care palliative care.  And he is a DNR.  He lives at home with his daughter [name].   He has metastatic prostate cancer with mets to the spine status post chemo and radiation.  Daughter concerned that his esophagus is burnt from radiation and he is having difficulty swallowing food or drinking liquids.  They have been using lidocaine solution which has been helping some however patient continues with not eating or drinking and losing weight with generalized weakness.  He has a history of paraplegia and he is unable to walk  Patient complains of abdominal pain he cannot explain more than that to me.  The daughter is not able to tell me anything more than that.  Patient has had no nausea vomiting or diarrhea.  She is not sure when his last bowel movement was.  She also reports that he has been more confused at home.   Hospital course:  Community acquired pneumonia Chest x-ray consistent with right sided infiltrate concerning for pneumonia. Started empirically on Levaquin. Completed course.   AKI Baseline creatinine of about 1.2-1.3. Creatinine of  3.27 on admission. Resolved with IV fluids.   Hypokalemia Supplementation given. Potassium 40 meq daily x3 days on discharge.   Hypercalcemia In setting of malignancy. Resolved with IV fluids. Normal corrected calcium.   Pancytopenia Transient leukopenia. Now resolved.   Acute on chronic anemia Baseline hemoglobin of about 10-11. Hemoglobin of 7.5 on admission with decrease to 6.4 after IV fluids. Patient received 1 unit of PRBC  on 11/4 and is now stable.   Thrombocytopenia Chronic and stable.   Elevated alkaline phosphatase Secondary to bony metastatic disease.   Metastatic prostate cancer Spine metastasis Patient follows at [facility]. History of chemo/radiation. Previously on hospice, now revoked. Palliative care consulted this admission and recommend outpatient palliative care follow-up.   Dysphagia Presumed secondary to radiation induced esophagitis. Continue Magic mouthwash, Carafate and dysphagia 2 diet.   Paraplegia Secondary to metastatic disease to spine. PT/OT recommending SNF on discharge.   Left peroneal/popliteal DVT History of IVC filter placement at [facility]. On Eliquis. Continue Eliquis. Outpatient follow-up for IVC management.  Discharge Diagnoses:  Active Problems:   Prostate cancer metastatic to bone ([place])   Paraplegia ([place])   Thrombocytopenia ([facility])   CAP (community acquired pneumonia)   Elevated serum creatinine    Discharge Instructions  Discharge Instructions     No wound care   Complete by: As directed       Allergies as of 06/01/2021       Reactions   Penicillins Hives, Other (See Comments)   Hives, welts, swelling profusely . Pretty severe reaction Has patient had a PCN reaction causing  immediate rash, facial/tongue/throat swelling, SOB or lightheadedness with hypotension: No Has patient had a PCN reaction causing severe rash involving mucus membranes or skin necrosis: Yes Has patient had a PCN reaction that required  hospitalization: Yes Has patient had a PCN reaction occurring within the last 10 years: No If all of the above answers are "NO", then may proceed with Cephalosporin use.   Lactose Intolerance (gi) Other (See Comments)   Gi upset        Medication List     STOP taking these medications    acetaminophen 325 MG tablet Commonly known as: TYLENOL   benzonatate 100 MG capsule Commonly known as: TESSALON   dexamethasone 2 MG tablet Commonly known as: DECADRON   enoxaparin 40 MG/0.4ML injection Commonly known as: LOVENOX   gabapentin 300 MG capsule Commonly known as: NEURONTIN   HYDROmorphone 2 MG tablet Commonly known as: Dilaudid   oxyCODONE-acetaminophen 5-325 MG tablet Commonly known as: Percocet       TAKE these medications    albuterol 108 (90 Base) MCG/ACT inhaler Commonly known as: VENTOLIN HFA Inhale 2 puffs into the lungs every 6 (six) hours as needed for wheezing or shortness of breath.   amLODipine 5 MG tablet Commonly known as: NORVASC Take 1.5 tablets (7.5 mg total) by mouth daily. What changed: how much to take   atorvastatin 40 MG tablet Commonly known as: LIPITOR Take 40 mg by mouth daily.   DULoxetine 60 MG capsule Commonly known as: CYMBALTA Take 60 mg by mouth daily.   Eliquis 5 MG Tabs tablet Generic drug: apixaban Take 5 mg by mouth 2 (two) times daily.   feeding supplement Liqd Take 237 mLs by mouth 2 (two) times daily between meals.   (feeding supplement) PROSource Plus liquid Take 30 mLs by mouth 2 (two) times daily between meals.   FeroSul 325 (65 FE) MG tablet Generic drug: ferrous sulfate Take 325 mg by mouth every other day.   magic mouthwash w/lidocaine Soln Take 5 mLs by mouth 4 (four) times daily for 9 days.   morphine 15 MG tablet Commonly known as: MSIR Take 1 tablet (15 mg total) by mouth 2 (two) times daily.   multivitamin with minerals Tabs tablet Take 1 tablet by mouth daily. Start taking on: June 02, 2021   ondansetron 4 MG tablet Commonly known as: ZOFRAN Take 2 tablets (8 mg total) by mouth every 8 (eight) hours as needed for nausea or vomiting.   oxyCODONE 5 MG immediate release tablet Commonly known as: Oxy IR/ROXICODONE Take 2 tablets (10 mg total) by mouth every 4 (four) hours as needed for severe pain.   polyethylene glycol 17 g packet Commonly known as: MIRALAX / GLYCOLAX Take 17 g by mouth 2 (two) times daily.   potassium chloride SA 20 MEQ tablet Commonly known as: KLOR-CON Take 2 tablets (40 mEq total) by mouth daily for 3 days.   prednisoLONE 5 MG Tabs tablet Take 5 mg by mouth daily.   sucralfate 1 GM/10ML suspension Commonly known as: CARAFATE Take 10 mLs (1 g total) by mouth 4 (four) times daily -  with meals and at bedtime for 10 days.   vitamin B-12 1000 MCG tablet Commonly known as: CYANOCOBALAMIN Take 1 tablet (1,000 mcg total) by mouth daily.        Contact information for after-discharge care     Destination     [facility] .   Service: Skilled Nursing Contact information: [address] [place] [phone]                    Allergies  Allergen Reactions   Penicillins Hives and Other (See Comments)    Hives, welts, swelling profusely . Pretty severe reaction Has patient had a PCN reaction causing immediate rash, facial/tongue/throat swelling, SOB or lightheadedness with hypotension: No Has patient had a PCN reaction causing severe rash involving mucus membranes or skin necrosis: Yes Has patient had a PCN reaction that required hospitalization: Yes Has patient had a PCN reaction occurring within the last 10 years: No If all of the above answers are "NO", then may proceed with Cephalosporin use.    Lactose Intolerance (Gi) Other (See Comments)    Gi upset    Consultations: Palliative care medicine   Procedures/Studies: MR CERVICAL SPINE W WO CONTRAST  Result Date:  05/30/2021 CLINICAL DATA:  Initial evaluation for metastatic prostate cancer. Paraplegia. EXAM: MRI CERVICAL SPINE WITHOUT AND WITH CONTRAST TECHNIQUE: Multiplanar and multiecho pulse sequences of the cervical spine, to include the craniocervical junction and cervicothoracic junction, were obtained without and with intravenous contrast. CONTRAST:  66mL GADAVIST GADOBUTROL 1 MMOL/ML IV SOLN COMPARISON:  None available. FINDINGS: Alignment: Reversal of the normal cervical lordosis, apex at C6. No listhesis. Vertebrae: Abnormal signal intensity seen diffusely throughout the visualized osseous structures, consistent with osseous metastatic disease. There is likely involvement of all levels. Superimposed marrow edema and enhancement at C4 through C7 suspected to reflect superimposed radiation changes. Height loss involving the C5, C6, and C7 vertebral bodies noted, favored to be chronic and degenerative. Vertebral bodies are partially ankylosed at these levels. Vertebral body height otherwise maintained with no other pathologic fracture. Probable subtle extraosseous extension with involvement of the ventral epidural space at approximately C4 through C6. Epidural extension into the left neural foramina of C4-5 through C7-T1. Probable epidural extension into the right neural foramina at C6-7 and C7-T1. Cord: Normal signal and morphology. No cord signal changes to suggest myelopathy. Posterior Fossa, vertebral arteries, paraspinal tissues: Visualized brain and posterior fossa within normal limits. Craniocervical junction normal. Diffuse soft tissue swelling with edema and layering retropharyngeal effusion seen involving the prevertebral/retropharyngeal soft tissues, most pronounced at C4-5. Additional edema within the left greater than right paraspinous and soft tissues of the neck. Findings likely reflect post radiation changes. No loculated collections. Normal flow voids seen within the vertebral arteries bilaterally.  Disc levels: C2-C3: Mild degenerative intervertebral disc space narrowing with disc desiccation. Right greater than left uncovertebral spurring without significant disc bulge. Severe right with mild-to-moderate left facet arthrosis. No significant spinal stenosis. Severe right C3 foraminal narrowing. Left neural foramen remains patent. C3-C4: Degenerative intervertebral disc space narrowing. Central disc protrusion indents the ventral thecal sac, contacting and mildly flattening the ventral cord. Associated annular fissure. Severe right with moderate left facet arthrosis. Mild bilateral uncovertebral spurring. Moderate spinal stenosis with severe bilateral C4 foraminal narrowing. C4-C5: Degenerative intervertebral disc space narrowing. Broad-based central disc osteophyte complex indents and largely effaces the ventral thecal sac. Mild cord flattening without cord signal changes. Moderate right worse than left facet hypertrophy. Moderate spinal stenosis. Moderate right C5 foraminal narrowing. Probable tumor extension into the left neural foramen with moderate left foraminal stenosis. C5-C6: Advanced degenerative intervertebral disc space narrowing with diffuse disc osteophyte complex. C5 and C6 vertebral bodies are partially ankylosed anteriorly. Flattening and partial effacement of the ventral thecal sac, asymmetric to the right. Mild cord flattening without cord signal changes. Moderate spinal stenosis. Right  worse than left uncovertebral spurring with resultant moderate right C6 foraminal narrowing. Tumor extension into the left neural foramen with moderate to severe left foraminal stenosis. C6-C7: Advanced degenerative intervertebral disc space narrowing with diffuse disc osteophyte complex. C6 and C7 vertebral bodies are partially ankylosed. Broad posterior component flattens and effaces the ventral thecal sac, asymmetric to the right. Mild cord flattening without cord signal changes. Moderate spinal stenosis.  Superimposed uncovertebral hypertrophy with tumor extension into the bilateral neural foramina. Resultant fairly severe bilateral foraminal stenosis. C7-T1: Moderate degenerative intervertebral disc space narrowing with diffuse disc bulge, asymmetric to the right. Superimposed endplate and uncovertebral spurring. Probable tumor extension into the right greater than left neural foramina. Mild to moderate spinal stenosis. Severe right worse than left C8 foraminal narrowing. IMPRESSION: 1. Diffuse osseous metastatic disease throughout the cervical spine. 2. Probable subtle extraosseous extension of tumor into the ventral epidural space at C4 through C6, with additional epidural involvement into the left neural foramina at C4-5 through C7-T1, and the right neural foramina at C6-7 and C7-T1. 3. Superimposed edema and enhancement involving the C4 through C7 vertebral bodies, suspected to reflect post radiation changes. Similarly, prevertebral edema and effusion also suspected to reflect post treatment changes. Correlation with history recommended. 4. Underlying advanced multilevel degenerative spondylosis with resultant moderate diffuse spinal stenosis at C3-4 through C6-7. No cord signal changes to suggest myelopathy. Associated moderate to severe bilateral C3 through C8 foraminal narrowing as above. Electronically Signed   By: Jeannine Boga M.D.   On: 05/30/2021 02:57   MR THORACIC SPINE W WO CONTRAST  Result Date: 05/30/2021 CLINICAL DATA:  Initial evaluation for metastatic prostate cancer, status post chemo radiation. EXAM: MRI THORACIC WITHOUT AND WITH CONTRAST TECHNIQUE: Multiplanar and multiecho pulse sequences of the thoracic spine were obtained without and with intravenous contrast. CONTRAST:  62mL GADAVIST GADOBUTROL 1 MMOL/ML IV SOLN COMPARISON:  Previous MRI from 12/11/2020. FINDINGS: Alignment: Examination degraded by motion. Physiologic with preservation of the normal thoracic kyphosis. No  interval listhesis or malalignment. Vertebrae: Widespread osseous metastatic disease again seen throughout the thoracic spine. Vertebral body height maintained without interval pathologic fracture. Previously seen extraosseous extension of tumor at the level of T6 is improved, with only a small amount of residual epidural tumor now seen at this level. Mild epidural extension into the right ventral epidural space at the levels of T3 and T4, similar to previous. Now seen is somewhat smooth diffuse enhancement involving both the ventral and dorsal epidural space extending from the cervicothoracic junction through T6 (series 52, image 11). Given the relatively smooth morphology of this finding, this is suspected to in large part reflect post radiation changes, although a small amount of superimposed epidural tumor is likely present as well, most pronounced within the ventral epidural space at the levels of T3 and T4. Progressive epidural lipomatosis also noted within the upper thoracic spine. There is progressive mild to moderate spinal stenosis at the level of T2-3 with minimal cord flattening, but no cord signal changes (series 41, image 7). Osseous expansion and/or early epidural extension of tumor into multiple right-sided neural foramina noted as well, most pronounced at T6-7 (series 51, image 9). Similar but less pronounced changes seen about the left neural foramina, most pronounced at T3-4 (series 51, image 15). Cord: Patchy T2 signal abnormality seen within the thoracic cord at the level of T6 at site of previously seen cord compression, likely reflecting myelomalacia (series 51, image 12). No other convincing cord signal changes seen on  this motion degraded exam. Progressive mild to moderate spinal stenosis at the level of T2-3 as above. No other significant stenosis seen within the thoracic spine. Paraspinal and other soft tissues: Large layering bilateral pleural effusions noted. Paraspinous soft tissues  demonstrate no other acute finding. Disc levels: Underlying mild for age spondylosis, not significantly changed or progressed from prior. No other significant spinal stenosis within the thoracic spine. IMPRESSION: 1. Widespread osseous metastatic disease throughout the thoracic spine. No interval pathologic fracture. 2. Interval improvement in previously seen epidural tumor at T6, consistent with response to therapy. No significant residual spinal stenosis at this level. Patchy cord signal abnormality at this level most likely reflects myelomalacia given the prior stenosis. 3. New fairly smooth epidural enhancement extending from the cervicothoracic junction through T6, favored to in large part reflect post radiation changes, although there is undoubtedly a small amount of superimposed epidural tumor at a few levels as above. Progressive mild to moderate spinal stenosis at the level of T2-3. Attention at follow-up recommended. 4. Large layering bilateral pleural effusions. Electronically Signed   By: Jeannine Boga M.D.   On: 05/30/2021 04:54   MR Lumbar Spine W Wo Contrast  Result Date: 05/30/2021 CLINICAL DATA:  Initial evaluation for metastatic disease. EXAM: MRI LUMBAR SPINE WITHOUT AND WITH CONTRAST TECHNIQUE: Multiplanar and multiecho pulse sequences of the lumbar spine were obtained without and with intravenous contrast. CONTRAST:  51mL GADAVIST GADOBUTROL 1 MMOL/ML IV SOLN COMPARISON:  Prior MRI from 12/11/2020. FINDINGS: Segmentation: Standard. Same numbering system employed as on previous exam. Alignment: Moderate dextroscoliosis. No interval listhesis or malalignment. Vertebrae: Susceptibility artifact related to prior posterior fusion at L4 through S1. Underlying widespread osseous metastatic disease again seen, essentially involving all levels as well as the visualized sacrum and pelvis. Vertebral body height maintained without interval pathologic fracture. Interval Schmorl's node deformity at  the inferior endplate of Z66 noted. Diffusely decreased T1 signal intensity within the underlying bone marrow, progressed from prior, and likely reflecting post treatment changes. No significant epidural extension of tumor within the lumbar spine. Osseous expansion about multiple pedicles with associated mild to moderate foraminal narrowing at multiple levels, most pronounced at T10-11 through L1-2 on the right (series 1, image 7), and L3-4 on the left (series 1, image 14). Conus medullaris and cauda equina: Conus extends to the L1 level. Conus medullaris within normal limits. Visualized nerve roots of the cauda equina are unremarkable. Distal nerve roots are obscured by susceptibility artifact. Paraspinal and other soft tissues: Chronic postoperative changes noted within the lower posterior paraspinous soft tissues. No visible acute soft tissue abnormality. Disc levels: No significant disc pathology seen through the L3-4 level. Evaluation of the lower lumbar spine limited by extensive susceptibility artifact. No visible significant spinal stenosis. IMPRESSION: 1. Diffuse osseous metastatic disease throughout the lumbar spine. No pathologic fracture or significant epidural extension of tumor. Associated osseous expansion about multiple pedicles with progressive mild to moderate foraminal narrowing on the right at T10-11 through L1-2, and on the left at L3-4. 2. Prior posterior fusion at L4 through S1. No progressive spinal stenosis within the upper lumbar spine. Electronically Signed   By: Jeannine Boga M.D.   On: 05/30/2021 05:19   DG Chest Port 1 View  Result Date: 05/26/2021 CLINICAL DATA:  History of prostate cancer. EXAM: PORTABLE CHEST 1 VIEW COMPARISON:  Weakness, increased weakness.  Diminished appetite. FINDINGS: Lung volumes are diminished and image slightly rotated. Accounting for this cardiomediastinal contours and hilar structures are stable. Increased  opacity in the RIGHT mid chest and RIGHT  lower lobe since previous imaging. Sclerotic lesions in the bilateral ribs are again noted. EKG leads project over the chest. IMPRESSION: Areas of increased opacity in the RIGHT lower chest in particular, raising the question of airspace disease/infection. Multifocal sclerotic rib lesions in this patient with known prostate cancer metastases. Electronically Signed   By: Zetta Bills M.D.   On: 05/26/2021 15:06   US Abdomen Limited RUQ (LIVER/GB)  Result Date: 05/26/2021 CLINICAL DATA:  Elevated creatinine EXAM: ULTRASOUND ABDOMEN LIMITED RIGHT UPPER QUADRANT COMPARISON:  CT from 12/13/2020 FINDINGS: Gallbladder: Gallbladder is well distended with gallbladder sludge. No stones are identified. No wall thickening is seen. Common bile duct: Diameter: 6.7 mm which is within normal limits for the patient's given age. Liver: No focal lesion identified. Within normal limits in parenchymal echogenicity. Portal vein is patent on color Doppler imaging with normal direction of blood flow towards the liver. Other: None. IMPRESSION: Gallbladder sludge without acute complicating factors. No other focal abnormality is noted. Electronically Signed   By: Inez Catalina M.D.   On: 05/26/2021 19:41      Subjective: Some left arm swelling from IV. No other concerns.  Discharge Exam: Vitals:   05/31/21 2130 06/01/21 0524  BP: 123/74 127/71  Pulse: 100 100  Resp: 20 19  Temp: 98.1 F (36.7 C) 98.4 F (36.9 C)  SpO2: 97% 97%   Vitals:   05/31/21 0537 05/31/21 1328 05/31/21 2130 06/01/21 0524  BP: (!) 160/71 124/71 123/74 127/71  Pulse: (!) 106 97 100 100  Resp: 18 18 20 19   Temp: 98.3 F (36.8 C) 98 F (36.7 C) 98.1 F (36.7 C) 98.4 F (36.9 C)  TempSrc:  Oral Oral Oral  SpO2: 97% 97% 97% 97%  Weight:      Height:        General: Pt is alert, awake, not in acute distress Cardiovascular: RRR, S1/S2 +, no rubs, no gallops Respiratory: CTA bilaterally, no wheezing, no rhonchi Abdominal: Soft, NT, ND,  bowel sounds + Extremities: Mild left arm edema without tenderness or erythema, no cyanosis    The results of significant diagnostics from this hospitalization (including imaging, microbiology, ancillary and laboratory) are listed below for reference.     Microbiology: Recent Results (from the past 240 hour(s))  Culture, blood (Routine X 2) w Reflex to ID Panel     Status: None   Collection Time: 05/26/21  2:18 PM   Specimen: BLOOD  Result Value Ref Range Status   Specimen Description   Final    BLOOD RIGHT ANTECUBITAL Performed at [facility] [address], [place], [place] [address]    Special Requests   Final    BOTTLES DRAWN AEROBIC AND ANAEROBIC Blood Culture results may not be optimal due to an excessive volume of blood received in culture bottles Performed at [facility] [address], [place], [place] [address]    Culture   Final    NO GROWTH 5 DAYS Performed at [facility], [place] [address], [place], [place] [address]    Report Status 05/31/2021 FINAL  Final  Culture, blood (Routine X 2) w Reflex to ID Panel     Status: None   Collection Time: 05/26/21  2:23 PM   Specimen: BLOOD  Result Value Ref Range Status   Specimen Description   Final    BLOOD LEFT ANTECUBITAL Performed at [facility] [address]  [place], [place] [address]    Special Requests   Final    BOTTLES DRAWN AEROBIC AND ANAEROBIC Blood Culture adequate volume Performed at [facility] [address] [place], [place] [address]    Culture   Final    NO GROWTH 5 DAYS Performed at [facility], [place] [address], [place], [place] [address]    Report Status 05/31/2021 FINAL  Final  Resp Panel by RT-PCR (Flu A&B, Covid) Nasopharyngeal Swab     Status: None   Collection Time: 05/26/21  3:30 PM   Specimen: Nasopharyngeal Swab; Nasopharyngeal(NP) swabs in vial transport medium  Result Value Ref Range Status    SARS Coronavirus 2 by RT PCR NEGATIVE NEGATIVE Final    Comment: (NOTE) SARS-CoV-2 target nucleic acids are NOT DETECTED.  The SARS-CoV-2 RNA is generally detectable in upper respiratory specimens during the acute phase of infection. The lowest concentration of SARS-CoV-2 viral copies this assay can detect is 138 copies/mL. A negative result does not preclude SARS-Cov-2 infection and should not be used as the sole basis for treatment or other patient management decisions. A negative result may occur with  improper specimen collection/handling, submission of specimen other than nasopharyngeal swab, presence of viral mutation(s) within the areas targeted by this assay, and inadequate number of viral copies(<138 copies/mL). A negative result must be combined with clinical observations, patient history, and epidemiological information. The expected result is Negative.  Fact Sheet for Patients:  EntrepreneurPulse.com.au  Fact Sheet for Healthcare Providers:  [employer]  This test is no t yet approved or cleared by the [place] FDA and  has been authorized for detection and/or diagnosis of SARS-CoV-2 by FDA under an Emergency Use Authorization (EUA). This EUA will remain  in effect (meaning this test can be used) for the duration of the COVID-19 declaration under Section 564(b)(1) of the Act, 21 U.S.C.section 360bbb-3(b)(1), unless the authorization is terminated  or revoked sooner.       Influenza A by PCR NEGATIVE NEGATIVE Final   Influenza B by PCR NEGATIVE NEGATIVE Final    Comment: (NOTE) The Xpert Xpress SARS-CoV-2/FLU/RSV plus assay is intended as an aid in the diagnosis of influenza from Nasopharyngeal swab specimens and should not be used as a sole basis for treatment. Nasal washings and aspirates are unacceptable for Xpert Xpress SARS-CoV-2/FLU/RSV testing.  Fact Sheet for  Patients: EntrepreneurPulse.com.au  Fact Sheet for Healthcare Providers: IncredibleEmployment[employer]be  This test is not yet approved or cleared by the [place] FDA and has been authorized for detection and/or diagnosis of SARS-CoV-2 by FDA under an Emergency Use Authorization (EUA). This EUA will remain in effect (meaning this test can be used) for the duration of the COVID-19 declaration under Section 564(b)(1) of the Act, 21 U.S.C. section 360bbb-3(b)(1), unless the authorization is terminated or revoked.  Performed at [facility] [place] [address] [place], [place] [address]   Resp Panel by RT-PCR (Flu A&B, Covid) Nasopharyngeal Swab     Status: None   Collection Time: 06/01/21  9:42 AM   Specimen: Nasopharyngeal Swab; Nasopharyngeal(NP) swabs in vial transport medium  Result Value Ref Range Status   SARS Coronavirus 2 by RT PCR NEGATIVE NEGATIVE Final    Comment: (NOTE) SARS-CoV-2 target nucleic acids are NOT DETECTED.  The SARS-CoV-2 RNA is generally detectable in upper respiratory specimens during the acute phase of infection. The lowest concentration of SARS-CoV-2 viral copies this assay can detect is 138 copies/mL. A negative result does not preclude  SARS-Cov-2 infection and should not be used as the sole basis for treatment or other patient management decisions. A negative result may occur with  improper specimen collection/handling, submission of specimen other than nasopharyngeal swab, presence of viral mutation(s) within the areas targeted by this assay, and inadequate number of viral copies(<138 copies/mL). A negative result must be combined with clinical observations, patient history, and epidemiological information. The expected result is Negative.  Fact Sheet for Patients:  EntrepreneurPulse.com.au  Fact Sheet for Healthcare Providers:  Incredible[employer]ployment[employer]be  This test  is no t yet approved or cleared by the [place] FDA and  has been authorized for detection and/or diagnosis of SARS-CoV-2 by FDA under an Emergency Use Authorization (EUA). This EUA will remain  in effect (meaning this test can be used) for the duration of the COVID-19 declaration under Section 564(b)(1) of the Act, 21 U.S.C.section 360bbb-3(b)(1), unless the authorization is terminated  or revoked sooner.       Influenza A by PCR NEGATIVE NEGATIVE Final   Influenza B by PCR NEGATIVE NEGATIVE Final    Comment: (NOTE) The Xpert Xpress SARS-CoV-2/FLU/RSV plus assay is intended as an aid in the diagnosis of influenza from Nasopharyngeal swab specimens and should not be used as a sole basis for treatment. Nasal washings and aspirates are unacceptable for Xpert Xpress SARS-CoV-2/FLU/RSV testing.  Fact Sheet for Patients: EntrepreneurPulse.com.au  Fact Sheet for Healthcare Providers: [employer]ployment[employer]  This test is not yet approved or cleared by the [place] FDA and has been authorized for detection and/or diagnosis of SARS-CoV-2 by FDA under an Emergency Use Authorization (EUA). This EUA will remain in effect (meaning this test can be used) for the duration of the COVID-19 declaration under Section 564(b)(1) of the Act, 21 U.S.C. section 360bbb-3(b)(1), unless the authorization is terminated or revoked.  Performed at [facility], [place] [address], [place], [place] [address]      Labs: BNP (last 3 results) No results for input(s): BNP in the last 8760 hours. Basic Metabolic Panel: Recent Labs  Lab 05/28/21 1105 05/29/21 0945 05/29/21 1245 05/30/21 0714 05/31/21 0417 06/01/21 0424  NA  --  144 143 142 142 140  K  --  3.0* 2.7* 2.9* 3.1* 3.1*  CL  --  111 114* 112* 109 108  CO2  --  23 24 22 26 24   GLUCOSE  --  113* 111* 104* 104* 100*  BUN  --  35* 35* 28* 26* 20  CREATININE  --  1.40* 1.30* 1.18  1.13 1.01  CALCIUM  --  9.8 9.5 9.2 9.3 8.8*  MG 2.3  --   --   --   --  1.7  PHOS  --   --   --   --   --  2.3*   Liver Function Tests: Recent Labs  Lab 05/26/21 1403 05/27/21 0428 05/28/21 0733 06/01/21 0424  AST 40 37 36  --   ALT 12 11 10   --   ALKPHOS 697* 514* 500*  --   BILITOT 1.2 1.5* 1.0  --   PROT 6.3* 5.1* 4.8*  --   ALBUMIN 3.3* 2.4* 2.4* 2.1*   No results for input(s): LIPASE, AMYLASE in the last 168 hours. No results for input(s): AMMONIA in the last 168 hours. CBC: Recent Labs  Lab 05/26/21 1403 05/26/21 1715 05/26/21 1839 05/27/21 0428 05/28/21 0733 05/31/21 0417  WBC 3.4* 2.9*  --  3.6* 3.8* 4.1  NEUTROABS 2.6  --   --   --   --   --  HGB 7.5* 6.4* 6.9* 7.8* 8.2* 8.4*  HCT 25.0* 21.4* 23.3* 25.5* 27.3* 27.3*  MCV 102.0* 102.4*  --  99.2 100.7* 100.4*  PLT 127* 101*  --  103* 95* 107*   Cardiac Enzymes: No results for input(s): CKTOTAL, CKMB, CKMBINDEX, TROPONINI in the last 168 hours. BNP: Invalid input(s): POCBNP CBG: No results for input(s): GLUCAP in the last 168 hours. D-Dimer No results for input(s): DDIMER in the last 72 hours. Hgb A1c No results for input(s): HGBA1C in the last 72 hours. Lipid Profile No results for input(s): CHOL, HDL, LDLCALC, TRIG, CHOLHDL, LDLDIRECT in the last 72 hours. Thyroid function studies No results for input(s): TSH, T4TOTAL, T3FREE, THYROIDAB in the last 72 hours.  Invalid input(s): FREET3 Anemia work up No results for input(s): VITAMINB12, FOLATE, FERRITIN, TIBC, IRON, RETICCTPCT in the last 72 hours. Urinalysis    Component Value Date/Time   COLORURINE YELLOW 05/26/2021 1406   APPEARANCEUR HAZY (A) 05/26/2021 1406   APPEARANCEUR Clear 01/01/2018 1520   LABSPEC 1.018 05/26/2021 1406   PHURINE 5.0 05/26/2021 1406   GLUCOSEU NEGATIVE 05/26/2021 1406   HGBUR SMALL (A) 05/26/2021 1406   BILIRUBINUR NEGATIVE 05/26/2021 1406   BILIRUBINUR Negative 01/01/2018 1520   KETONESUR 5 (A) 05/26/2021 1406    PROTEINUR NEGATIVE 05/26/2021 1406   NITRITE NEGATIVE 05/26/2021 1406   LEUKOCYTESUR NEGATIVE 05/26/2021 1406   Sepsis Labs Invalid input(s): PROCALCITONIN,  WBC,  LACTICIDVEN Microbiology Recent Results (from the past 240 hour(s))  Culture, blood (Routine X 2) w Reflex to ID Panel     Status: None   Collection Time: 05/26/21  2:18 PM   Specimen: BLOOD  Result Value Ref Range Status   Specimen Description   Final    BLOOD RIGHT ANTECUBITAL Performed at [facility], [place] [address], [place], [place] [address]    Special Requests   Final    BOTTLES DRAWN AEROBIC AND ANAEROBIC Blood Culture results may not be optimal due to an excessive volume of blood received in culture bottles Performed at [place] [address], [place], [place] [address]    Culture   Final    NO GROWTH 5 DAYS Performed at [facility], [place] [address], [place], [place] [address]    Report Status 05/31/2021 FINAL  Final  Culture, blood (Routine X 2) w Reflex to ID Panel     Status: None   Collection Time: 05/26/21  2:23 PM   Specimen: BLOOD  Result Value Ref Range Status   Specimen Description   Final    BLOOD LEFT ANTECUBITAL Performed at [place] [address], [place], [place] [address]    Special Requests   Final    BOTTLES DRAWN AEROBIC AND ANAEROBIC Blood Culture adequate volume Performed at [place] [address], [place], [place] [address]    Culture   Final    NO GROWTH 5 DAYS Performed at [facility], [place] [address] [place], [place] [address]    Report Status 05/31/2021 FINAL  Final  Resp Panel by RT-PCR (Flu A&B, Covid) Nasopharyngeal Swab     Status: None   Collection Time: 05/26/21  3:30 PM   Specimen: Nasopharyngeal Swab; Nasopharyngeal(NP) swabs in vial transport medium  Result Value Ref Range Status   SARS Coronavirus 2 by RT PCR NEGATIVE NEGATIVE Final    Comment:  (NOTE) SARS-CoV-2 target nucleic acids are NOT DETECTED.  The SARS-CoV-2 RNA is generally detectable in  upper respiratory specimens during the acute phase of infection. The lowest concentration of SARS-CoV-2 viral copies this assay can detect is 138 copies/mL. A negative result does not preclude SARS-Cov-2 infection and should not be used as the sole basis for treatment or other patient management decisions. A negative result may occur with  improper specimen collection/handling, submission of specimen other than nasopharyngeal swab, presence of viral mutation(s) within the areas targeted by this assay, and inadequate number of viral copies(<138 copies/mL). A negative result must be combined with clinical observations, patient history, and epidemiological information. The expected result is Negative.  Fact Sheet for Patients:  EntrepreneurPulse.com.au  Fact Sheet for Healthcare Providers:  IncredibleEmployment[employer]be  This test is no t yet approved or cleared by the [place] FDA and  has been authorized for detection and/or diagnosis of SARS-CoV-2 by FDA under an Emergency Use Authorization (EUA). This EUA will remain  in effect (meaning this test can be used) for the duration of the COVID-19 declaration under Section 564(b)(1) of the Act, 21 U.S.C.section 360bbb-3(b)(1), unless the authorization is terminated  or revoked sooner.       Influenza A by PCR NEGATIVE NEGATIVE Final   Influenza B by PCR NEGATIVE NEGATIVE Final    Comment: (NOTE) The Xpert Xpress SARS-CoV-2/FLU/RSV plus assay is intended as an aid in the diagnosis of influenza from Nasopharyngeal swab specimens and should not be used as a sole basis for treatment. Nasal washings and aspirates are unacceptable for Xpert Xpress SARS-CoV-2/FLU/RSV testing.  Fact Sheet for Patients: EntrepreneurPulse.com.au  Fact Sheet for Healthcare  Providers: IncredibleEmployment[employer]be  This test is not yet approved or cleared by the [place] FDA and has been authorized for detection and/or diagnosis of SARS-CoV-2 by FDA under an Emergency Use Authorization (EUA). This EUA will remain in effect (meaning this test can be used) for the duration of the COVID-19 declaration under Section 564(b)(1) of the Act, 21 U.S.C. section 360bbb-3(b)(1), unless the authorization is terminated or revoked.  Performed at [facility], [place] [address], [place], [place] [address]   Resp Panel by RT-PCR (Flu A&B, Covid) Nasopharyngeal Swab     Status: None   Collection Time: 06/01/21  9:42 AM   Specimen: Nasopharyngeal Swab; Nasopharyngeal(NP) swabs in vial transport medium  Result Value Ref Range Status   SARS Coronavirus 2 by RT PCR NEGATIVE NEGATIVE Final    Comment: (NOTE) SARS-CoV-2 target nucleic acids are NOT DETECTED.  The SARS-CoV-2 RNA is generally detectable in upper respiratory specimens during the acute phase of infection. The lowest concentration of SARS-CoV-2 viral copies this assay can detect is 138 copies/mL. A negative result does not preclude SARS-Cov-2 infection and should not be used as the sole basis for treatment or other patient management decisions. A negative result may occur with  improper specimen collection/handling, submission of specimen other than nasopharyngeal swab, presence of viral mutation(s) within the areas targeted by this assay, and inadequate number of viral copies(<138 copies/mL). A negative result must be combined with clinical observations, patient history, and epidemiological information. The expected result is Negative.  Fact Sheet for Patients:  EntrepreneurPulse.com.au  Fact Sheet for Healthcare Providers:  IncredibleEmployment.be  This test is no t yet approved or cleared by the [place] FDA and  has been  authorized for detection and/or diagnosis of SARS-CoV-2 by FDA under an Emergency Use Authorization (EUA). This EUA will remain  in effect (meaning this test can be used) for the duration of the COVID-19 declaration under Section 564(b)(1)  of the Act, 21 U.S.C.section 360bbb-3(b)(1), unless the authorization is terminated  or revoked sooner.       Influenza A by PCR NEGATIVE NEGATIVE Final   Influenza B by PCR NEGATIVE NEGATIVE Final    Comment: (NOTE) The Xpert Xpress SARS-CoV-2/FLU/RSV plus assay is intended as an aid in the diagnosis of influenza from Nasopharyngeal swab specimens and should not be used as a sole basis for treatment. Nasal washings and aspirates are unacceptable for Xpert Xpress SARS-CoV-2/FLU/RSV testing.  Fact Sheet for Patients: EntrepreneurPulse.com.au  Fact Sheet for Healthcare Providers: IncredibleEmployment[employer]be  This test is not yet approved or cleared by the [place] FDA and has been authorized for detection and/or diagnosis of SARS-CoV-2 by FDA under an Emergency Use Authorization (EUA). This EUA will remain in effect (meaning this test can be used) for the duration of the COVID-19 declaration under Section 564(b)(1) of the Act, 21 U.S.C. section 360bbb-3(b)(1), unless the authorization is terminated or revoked.  Performed at [facility], [place] [address], [place], [place] [address]      Time coordinating discharge: 35 minutes  SIGNED:   Cordelia Poche, MD [facility] 06/01/2021, 11:36 AM

## 2021-06-02 DIAGNOSIS — J189 Pneumonia, unspecified organism: Secondary | ICD-10-CM | POA: Diagnosis not present

## 2021-06-02 DIAGNOSIS — E876 Hypokalemia: Secondary | ICD-10-CM | POA: Diagnosis not present

## 2021-06-02 LAB — POTASSIUM: Potassium: 2.9 mmol/L — ABNORMAL LOW (ref 3.5–5.1)

## 2021-06-02 NOTE — Progress Notes (Signed)
Physical Therapy Treatment Patient Details Name: Terry Mitchell MRN: [id number] DOB: 1950/10/25 Today's Date: 06/02/2021   History of Present Illness Terry Mitchell is a 70 y.o. male with medical history significant of metastatic prostate cancer with mets to the bones.  He has cervical myelopathy metastatic prostate cancer with mets to the spine status post chemo and radiation   And he is a DNR.  Daughter reports. he is having difficulty swallowing food or drinking liquids.   He has a history of paraplegia and he is unable to walk.Pt.was admitted to CIR at [facility] on 05/06/2021 and discharged first week of November. Patient admitted with community-acquired pneumonia/hospital-acquired pneumonia    PT Comments    Pt in bed using personal cell phone.  General Comments: AxO x 3 but flat affect. General bed mobility comments: pt required Max Assist with side to side rolling limited by back pain and no active assist B LE then only <25% B UE's.  Pt present with hip and knee flex tightness. General transfer comment: used Maxi Move Lift this session due to poor ability with attempting STEDY. Positioned in recliner with multiple pillows with B LE's in extension.   Pt will need SNF.   Recommendations for follow up therapy are one component of a multi-disciplinary discharge planning process, led by the attending physician.  Recommendations may be updated based on patient status, additional functional criteria and insurance authorization.  Follow Up Recommendations  Skilled nursing-short term rehab (<3 hours/day)     Assistance Recommended at Discharge    Equipment Recommendations       Recommendations for Other Services       Precautions / Restrictions Precautions Precautions: Fall Precaution Comments: LUE paresis of ? etiol. mets to spine progressing     Mobility  Bed Mobility Overal bed mobility: Needs Assistance Bed Mobility: Rolling Rolling: Max assist         General bed mobility  comments: pt required Max Assist with side to side rolling limited by back pain and no active assist B LE then only <25% B UE's.  Pt present with hip and knee flex tightness.    Transfers Overall transfer level: Needs assistance                 General transfer comment: used Maxi Move Lift this session due to poor ability with attempting STEDY. Transfer via Lift Equipment: [employer]  Ambulation/Gait                   Stairs             Wheelchair Mobility    Modified Rankin (Stroke Patients Only)       Balance                                            Cognition Arousal/Alertness: Awake/alert Behavior During Therapy: Flat affect Overall Cognitive Status: No family/caregiver present to determine baseline cognitive functioning                                 General Comments: AxO x 3 but flat affect        Exercises      General Comments        Pertinent Vitals/Pain Pain Assessment: Faces Faces Pain Scale: Hurts even more Pain Location: back and  buttocks Pain Descriptors / Indicators: Discomfort;Grimacing;Guarding Pain Intervention(s): Monitored during session;Repositioned    Home Living                          Prior Function            PT Goals (current goals can now be found in the care plan section) Progress towards PT goals: Progressing toward goals    Frequency    Min 2X/week      PT Plan Current plan remains appropriate    Co-evaluation              AM-PAC PT "6 Clicks" Mobility   Outcome Measure  Help needed turning from your back to your side while in a flat bed without using bedrails?: Total Help needed moving from lying on your back to sitting on the side of a flat bed without using bedrails?: Total Help needed moving to and from a bed to a chair (including a wheelchair)?: Total Help needed standing up from a chair using your arms (e.g., wheelchair or bedside  chair)?: Total Help needed to walk in hospital room?: Total Help needed climbing 3-5 steps with a railing? : Total 6 Click Score: 6    End of Session   Activity Tolerance: Patient limited by fatigue;Patient limited by pain Patient left: in chair;with call bell/phone within reach Nurse Communication: Mobility status;Need for lift equipment PT Visit Diagnosis: Unsteadiness on feet (R26.81);Adult, failure to thrive (R62.7)     Time: 3716-9678 PT Time Calculation (min) (ACUTE ONLY): 27 min  Charges:  $Therapeutic Exercise: 23-37 mins                     Rica Koyanagi  PTA Acute  Rehabilitation Services Pager      [phone] Office      [phone]

## 2021-06-02 NOTE — Progress Notes (Addendum)
PROGRESS NOTE    Terry Mitchell  IDP:[id number] DOB: 30-Dec-1950 DOA: 05/26/2021 PCP: [name], MD   Brief Narrative: Terry Mitchell is a 70 y.o. male with medical history significant of metastatic prostate cancer with mets to the bones.  He had finished his radiation therapy approximately a week ago. Patient presented secondary to generalized weakness and decreased oral intake. On admission, he was found to have evidence of pneumonia.   Assessment & Plan:   Active Problems:   Prostate cancer metastatic to bone ([place])   Paraplegia (HCC)   Thrombocytopenia (HCC)   CAP (community acquired pneumonia)   Elevated serum creatinine   Community acquired pneumonia Chest x-ray consistent with right sided infiltrate concerning for pneumonia. Started empirically on Levaquin. Completed course.  AKI Baseline creatinine of about 1.2-1.3. Creatinine of 3.27 on admission. Resolved with IV fluids.  Hypokalemia -Replete as needed  Hypercalcemia In setting of malignancy. Resolved with IV fluids.  Pancytopenia Transient leukopenia. Now resolved.  Acute on chronic anemia Baseline hemoglobin of about 10-11. Hemoglobin of 7.5 on admission with decrease to 6.4 after IV fluids. Patient received 1 unit of PRBC  on 11/4 and is now stable.  Thrombocytopenia Chronic and stable.  Elevated alkaline phosphatase Secondary to bony metastatic disease.  Metastatic prostate cancer Spine metastasis Patient follows at [facility]. History of chemo/radiation. Previously on hospice, now revoked. Palliative care consulted this admission. -Palliative care recommendations: outpatient palliative care  Dysphagia Presumed secondary to radiation induced esophagitis. -Continue Magic mouthwash -Dysphagia 2 diet  Paraplegia Secondary to metastatic disease to spine. PT recommending SNF.  Left peroneal/popliteal DVT History of IVC filter placement at [facility]. On Eliquis -Continue  Eliquis  Peripheral edema Left arm likely secondary to IV. No findings concerning for DVT. Already on Eliquis.   DVT prophylaxis: Eliquis Code Status:   Code Status: DNR Family Communication: None at bedside Disposition Plan: Discharge to SNF if able to find a bed. If unable, may need to discharge home.   Consultants:  Palliative care  Procedures:  BLOOD TRANSFUSION; 1 UNIT (11/4)  Antimicrobials: Levaquin    Subjective: No concerns this morning. Left arm is not painful.  Objective: Vitals:   06/01/21 0524 06/01/21 1336 06/01/21 2126 06/02/21 0515  BP: 127/71 135/83 128/69 113/64  Pulse: 100 (!) 107 (!) 106 (!) 101  Resp: 19 17 20 20   Temp: 98.4 F (36.9 C) 97.8 F (36.6 C) 99 F (37.2 C) 98.8 F (37.1 C)  TempSrc: Oral Oral Oral Oral  SpO2: 97% 96% 91% 96%  Weight:      Height:        Intake/Output Summary (Last 24 hours) at 06/02/2021 1046 Last data filed at 06/02/2021 1000 Gross per 24 hour  Intake 600 ml  Output 950 ml  Net -350 ml    Filed Weights   05/26/21 1349  Weight: 89 kg    Examination:  General exam: Appears calm and comfortable Respiratory system: Rhonchi. Respiratory effort normal. Cardiovascular system: S1 & S2 heard, RRR. No murmurs, rubs, gallops or clicks. Gastrointestinal system: Abdomen is nondistended, soft and nontender. No organomegaly or masses felt. Normal bowel sounds heard. Central nervous system: Alert and oriented. No focal neurological deficits. Musculoskeletal: LUE edema. No calf tenderness Skin: No cyanosis. No rashes    Data Reviewed: I have personally reviewed following labs and imaging studies  CBC Lab Results  Component Value Date   WBC 4.1 05/31/2021   RBC 2.72 (L) 05/31/2021   HGB 8.4 (  L) 05/31/2021   HCT 27.3 (L) 05/31/2021   MCV 100.4 (H) 05/31/2021   MCH 30.9 05/31/2021   PLT 107 (L) 05/31/2021   MCHC 30.8 05/31/2021   RDW 18.6 (H) 05/31/2021   LYMPHSABS 0.3 (L) 05/26/2021   MONOABS 0.4  05/26/2021   EOSABS 0.0 05/26/2021   BASOSABS 0.0 60/63/0160     Last metabolic panel Lab Results  Component Value Date   NA 140 06/01/2021   K 3.1 (L) 06/01/2021   CL 108 06/01/2021   CO2 24 06/01/2021   BUN 20 06/01/2021   CREATININE 1.01 06/01/2021   GLUCOSE 100 (H) 06/01/2021   GFRNONAA >60 06/01/2021   GFRAA 50 (L) 04/06/2020   CALCIUM 8.8 (L) 06/01/2021   PHOS 2.3 (L) 06/01/2021   PROT 4.8 (L) 05/28/2021   ALBUMIN 2.1 (L) 06/01/2021   BILITOT 1.0 05/28/2021   ALKPHOS 500 (H) 05/28/2021   AST 36 05/28/2021   ALT 10 05/28/2021   ANIONGAP 8 06/01/2021    CBG (last 3)  No results for input(s): GLUCAP in the last 72 hours.   GFR: Estimated Creatinine Clearance: 76.8 mL/min (by C-G formula based on SCr of 1.01 mg/dL).  Coagulation Profile: No results for input(s): INR, PROTIME in the last 168 hours.  Recent Results (from the past 240 hour(s))  Culture, blood (Routine X 2) w Reflex to ID Panel     Status: None   Collection Time: 05/26/21  2:18 PM   Specimen: BLOOD  Result Value Ref Range Status   Specimen Description   Final    BLOOD RIGHT ANTECUBITAL Performed at [facility] [address] [place], [place] [address]    Special Requests   Final    BOTTLES DRAWN AEROBIC AND ANAEROBIC Blood Culture results may not be optimal due to an excessive volume of blood received in culture bottles Performed at [facility] [address], [place], [place] [address]    Culture   Final    NO GROWTH 5 DAYS Performed at [facility], [place] [address] [place], [place] [address]    Report Status 05/31/2021 FINAL  Final  Culture, blood (Routine X 2) w Reflex to ID Panel     Status: None   Collection Time: 05/26/21  2:23 PM   Specimen: BLOOD  Result Value Ref Range Status   Specimen Description   Final    BLOOD LEFT ANTECUBITAL Performed at [facility] [address] [place], [place] [address]    Special  Requests   Final    BOTTLES DRAWN AEROBIC AND ANAEROBIC Blood Culture adequate volume Performed at [facility] [address] [place], [place] [address]    Culture   Final    NO GROWTH 5 DAYS Performed at [facility], [place] [address], [place], [place] [address]    Report Status 05/31/2021 FINAL  Final  Resp Panel by RT-PCR (Flu A&B, Covid) Nasopharyngeal Swab     Status: None   Collection Time: 05/26/21  3:30 PM   Specimen: Nasopharyngeal Swab; Nasopharyngeal(NP) swabs in vial transport medium  Result Value Ref Range Status   SARS Coronavirus 2 by RT PCR NEGATIVE NEGATIVE Final    Comment: (NOTE) SARS-CoV-2 target nucleic acids are NOT DETECTED.  The SARS-CoV-2 RNA is generally detectable in upper respiratory specimens during the acute phase of infection. The lowest concentration of SARS-CoV-2 viral copies this assay can detect is 138 copies/mL. A negative result does not  preclude SARS-Cov-2 infection and should not be used as the sole basis for treatment or other patient management decisions. A negative result may occur with  improper specimen collection/handling, submission of specimen other than nasopharyngeal swab, presence of viral mutation(s) within the areas targeted by this assay, and inadequate number of viral copies(<138 copies/mL). A negative result must be combined with clinical observations, patient history, and epidemiological information. The expected result is Negative.  Fact Sheet for Patients:  EntrepreneurPulse.com.au  Fact Sheet for Healthcare Providers:  IncredibleEmployment.be  This test is no t yet approved or cleared by the [place] FDA and  has been authorized for detection and/or diagnosis of SARS-CoV-2 by FDA under an Emergency Use Authorization (EUA). This EUA will remain  in effect (meaning this test can be used) for the duration of the COVID-19 declaration under Section 564(b)(1)  of the Act, 21 U.S.C.section 360bbb-3(b)(1), unless the authorization is terminated  or revoked sooner.       Influenza A by PCR NEGATIVE NEGATIVE Final   Influenza B by PCR NEGATIVE NEGATIVE Final    Comment: (NOTE) The Xpert Xpress SARS-CoV-2/FLU/RSV plus assay is intended as an aid in the diagnosis of influenza from Nasopharyngeal swab specimens and should not be used as a sole basis for treatment. Nasal washings and aspirates are unacceptable for Xpert Xpress SARS-CoV-2/FLU/RSV testing.  Fact Sheet for Patients: EntrepreneurPulse.com.au  Fact Sheet for Healthcare Providers: IncredibleEmployment.be  This test is not yet approved or cleared by the [place] FDA and has been authorized for detection and/or diagnosis of SARS-CoV-2 by FDA under an Emergency Use Authorization (EUA). This EUA will remain in effect (meaning this test can be used) for the duration of the COVID-19 declaration under Section 564(b)(1) of the Act, 21 U.S.C. section 360bbb-3(b)(1), unless the authorization is terminated or revoked.  Performed at [facility], [place] [address] , [place] [address]   Resp Panel by RT-PCR (Flu A&B, Covid) Nasopharyngeal Swab     Status: None   Collection Time: 06/01/21  9:42 AM   Specimen: Nasopharyngeal Swab; Nasopharyngeal(NP) swabs in vial transport medium  Result Value Ref Range Status   SARS Coronavirus 2 by RT PCR NEGATIVE NEGATIVE Final    Comment: (NOTE) SARS-CoV-2 target nucleic acids are NOT DETECTED.  The SARS-CoV-2 RNA is generally detectable in upper respiratory specimens during the acute phase of infection. The lowest concentration of SARS-CoV-2 viral copies this assay can detect is 138 copies/mL. A negative result does not preclude SARS-Cov-2 infection and should not be used as the sole basis for treatment or other patient management decisions. A negative result may occur with  improper  specimen collection/handling, submission of specimen other than nasopharyngeal swab, presence of viral mutation(s) within the areas targeted by this assay, and inadequate number of viral copies(<138 copies/mL). A negative result must be combined with clinical observations, patient history, and epidemiological information. The expected result is Negative.  Fact Sheet for Patients:  EntrepreneurPulse.com.au  Fact Sheet for Healthcare Providers:  IncredibleEmployment.be  This test is no t yet approved or cleared by the [place] FDA and  has been authorized for detection and/or diagnosis of SARS-CoV-2 by FDA under an Emergency Use Authorization (EUA). This EUA will remain  in effect (meaning this test can be used) for the duration of the COVID-19 declaration under Section 564(b)(1) of the Act, 21 U.S.C.section 360bbb-3(b)(1), unless the authorization is terminated  or revoked sooner.       Influenza A by PCR NEGATIVE NEGATIVE Final  Influenza B by PCR NEGATIVE NEGATIVE Final    Comment: (NOTE) The Xpert Xpress SARS-CoV-2/FLU/RSV plus assay is intended as an aid in the diagnosis of influenza from Nasopharyngeal swab specimens and should not be used as a sole basis for treatment. Nasal washings and aspirates are unacceptable for Xpert Xpress SARS-CoV-2/FLU/RSV testing.  Fact Sheet for Patients: EntrepreneurPulse.com.au  Fact Sheet for Healthcare Providers: IncredibleEmployment.be  This test is not yet approved or cleared by the [place] FDA and has been authorized for detection and/or diagnosis of SARS-CoV-2 by FDA under an Emergency Use Authorization (EUA). This EUA will remain in effect (meaning this test can be used) for the duration of the COVID-19 declaration under Section 564(b)(1) of the Act, 21 U.S.C. section 360bbb-3(b)(1), unless the authorization is terminated or revoked.  Performed at  [facility], [place] [address], [place], [place] [address]         Radiology Studies: No results found.      Scheduled Meds:  (feeding supplement) PROSource Plus  30 mL Oral BID BM   amLODipine  7.5 mg Oral Daily   apixaban  5 mg Oral BID   feeding supplement  237 mL Oral BID BM   furosemide  20 mg Intravenous Daily   magic mouthwash w/lidocaine  5 mL Oral QID   multivitamin with minerals  1 tablet Oral Daily   sucralfate  1 g Oral TID WC & HS   Continuous Infusions:   LOS: 7 days     Cordelia Poche, MD [facility] 06/02/2021, 10:46 AM  If 7PM-7AM, please contact night-coverage [email or web address]
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
## 2021-06-02 NOTE — TOC Progression Note (Signed)
Transition of Care [facility]) - Progression Note    Patient Details  Name: Terry Mitchell MRN: [id number] Date of Birth: 18-Aug-1950  [facility] CM/SW Contact  Lennart Pall, [place] Phone Number: 06/02/2021, 3:22 PM  Clinical Narrative:    Have spoken with [place] [facility] supervisor about current dc barriers as daughter cannot meet pt's current care needs in her home, however, SNFs not wanting to consider pt for placement due to hx of past criminal charges.  We are attempting to speak with SNFs about their concerns to see if we can possibly secure a SNF bed given pt's significant limitations of paraplegia and inability to be a "threat" to anyone.    Expected Discharge Plan: Skilled Nursing Facility Barriers to Discharge: Other (must enter comment) (h/o criminal charges preventing accepted to SNF)  Expected Discharge Plan and Services Expected Discharge Plan: [place] In-house Referral: Clinical Social Work   Post Acute Care Choice: [place] arrangements for the past 2 months: Single Family Home Expected Discharge Date: 06/01/21                                     Social Determinants of Health (SDOH) Interventions    Readmission Risk Interventions No flowsheet data found.

## 2021-06-03 ENCOUNTER — Inpatient Hospital Stay (HOSPITAL_COMMUNITY): Payer: Medicare HMO

## 2021-06-03 DIAGNOSIS — E876 Hypokalemia: Secondary | ICD-10-CM | POA: Diagnosis not present

## 2021-06-03 DIAGNOSIS — G8220 Paraplegia, unspecified: Secondary | ICD-10-CM | POA: Diagnosis not present

## 2021-06-03 DIAGNOSIS — J189 Pneumonia, unspecified organism: Secondary | ICD-10-CM | POA: Diagnosis not present

## 2021-06-03 LAB — MAGNESIUM: Magnesium: 1.9 mg/dL (ref 1.7–2.4)

## 2021-06-03 IMAGING — DX DG ABD PORTABLE 1V
2 series · 2 of 2 positions shown · non-contrast
Comparison: [DATE]

CLINICAL DATA: Abdominal distension

EXAM:
PORTABLE ABDOMEN - 1 VIEW

[abdomen kub (1 of 2)]
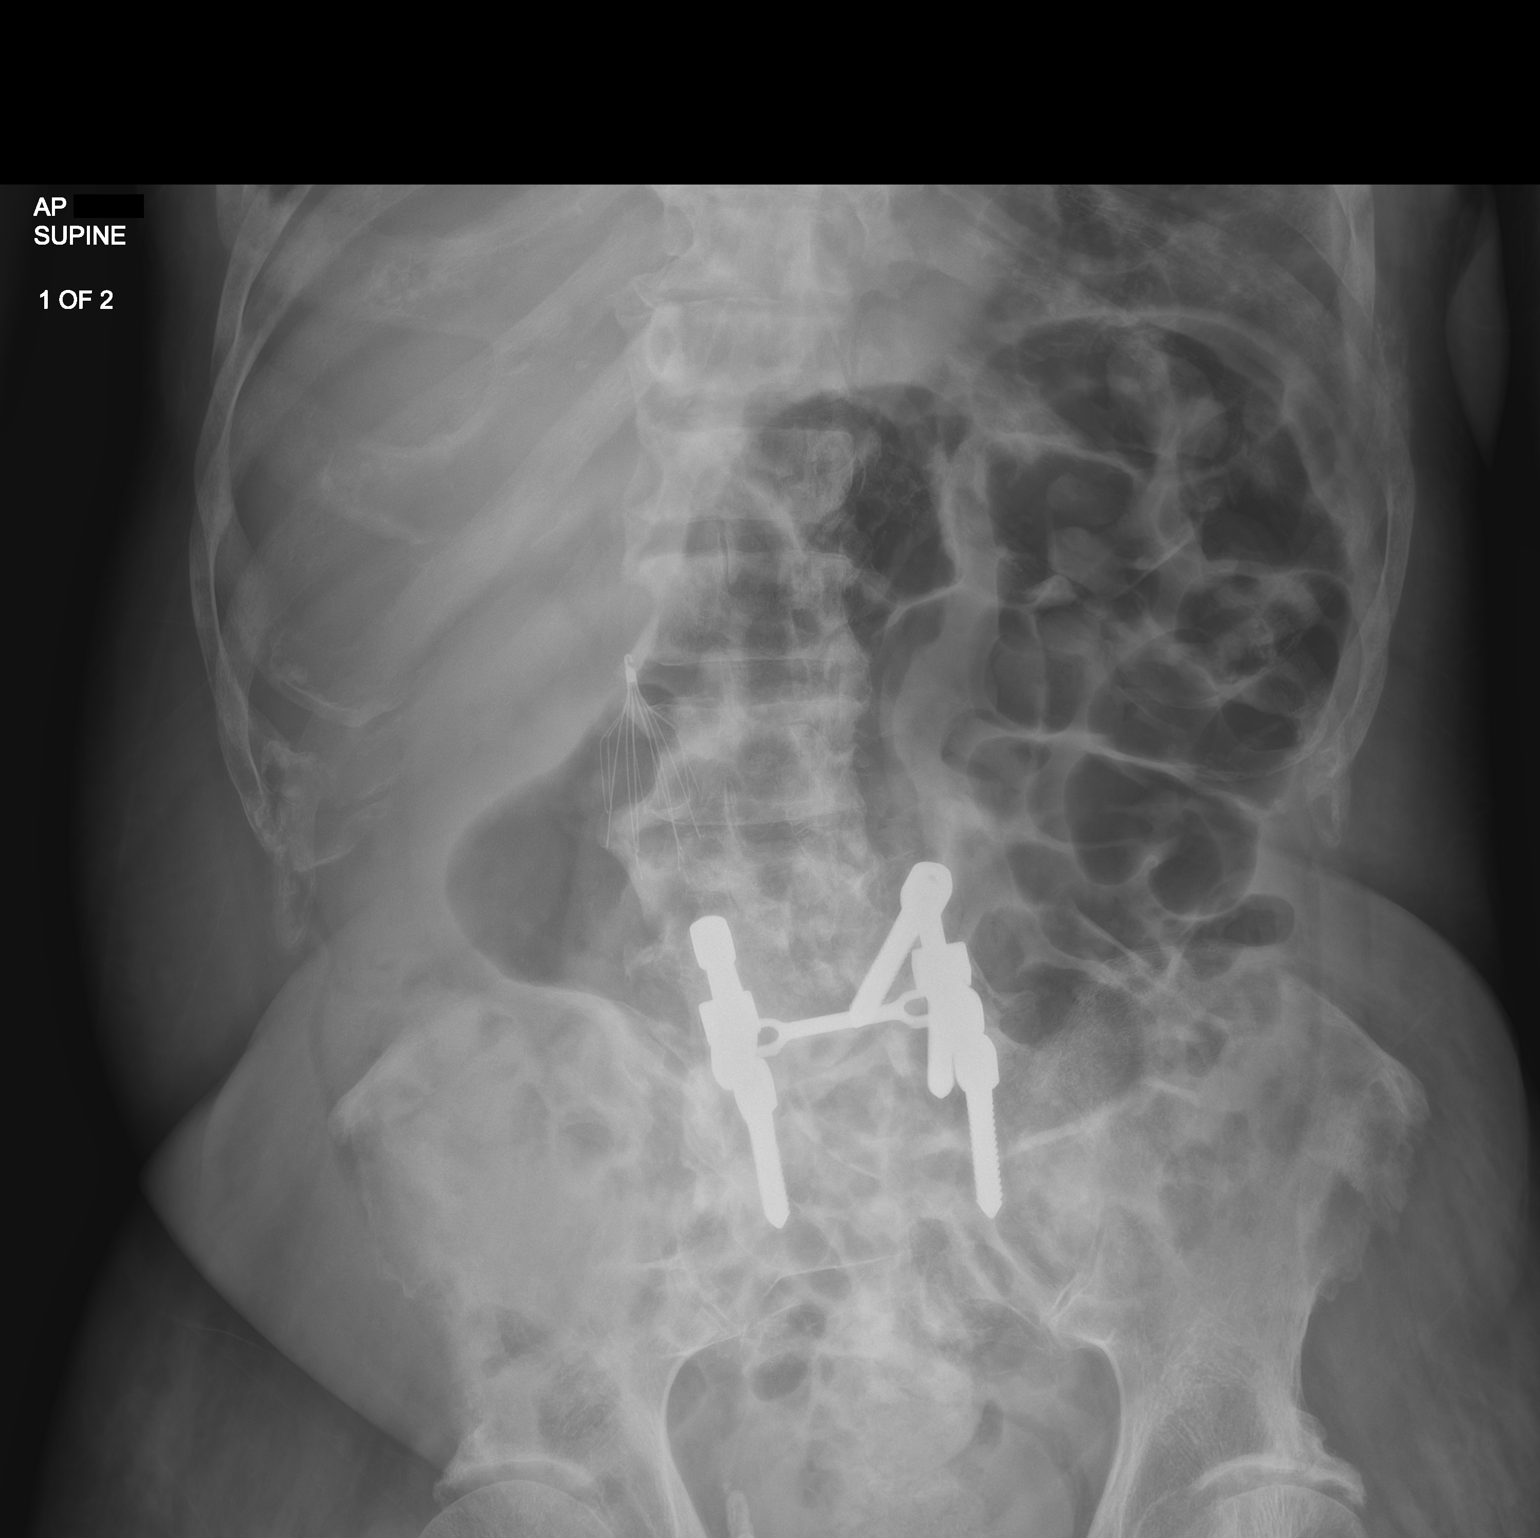

[abdomen kub (2 of 2)]
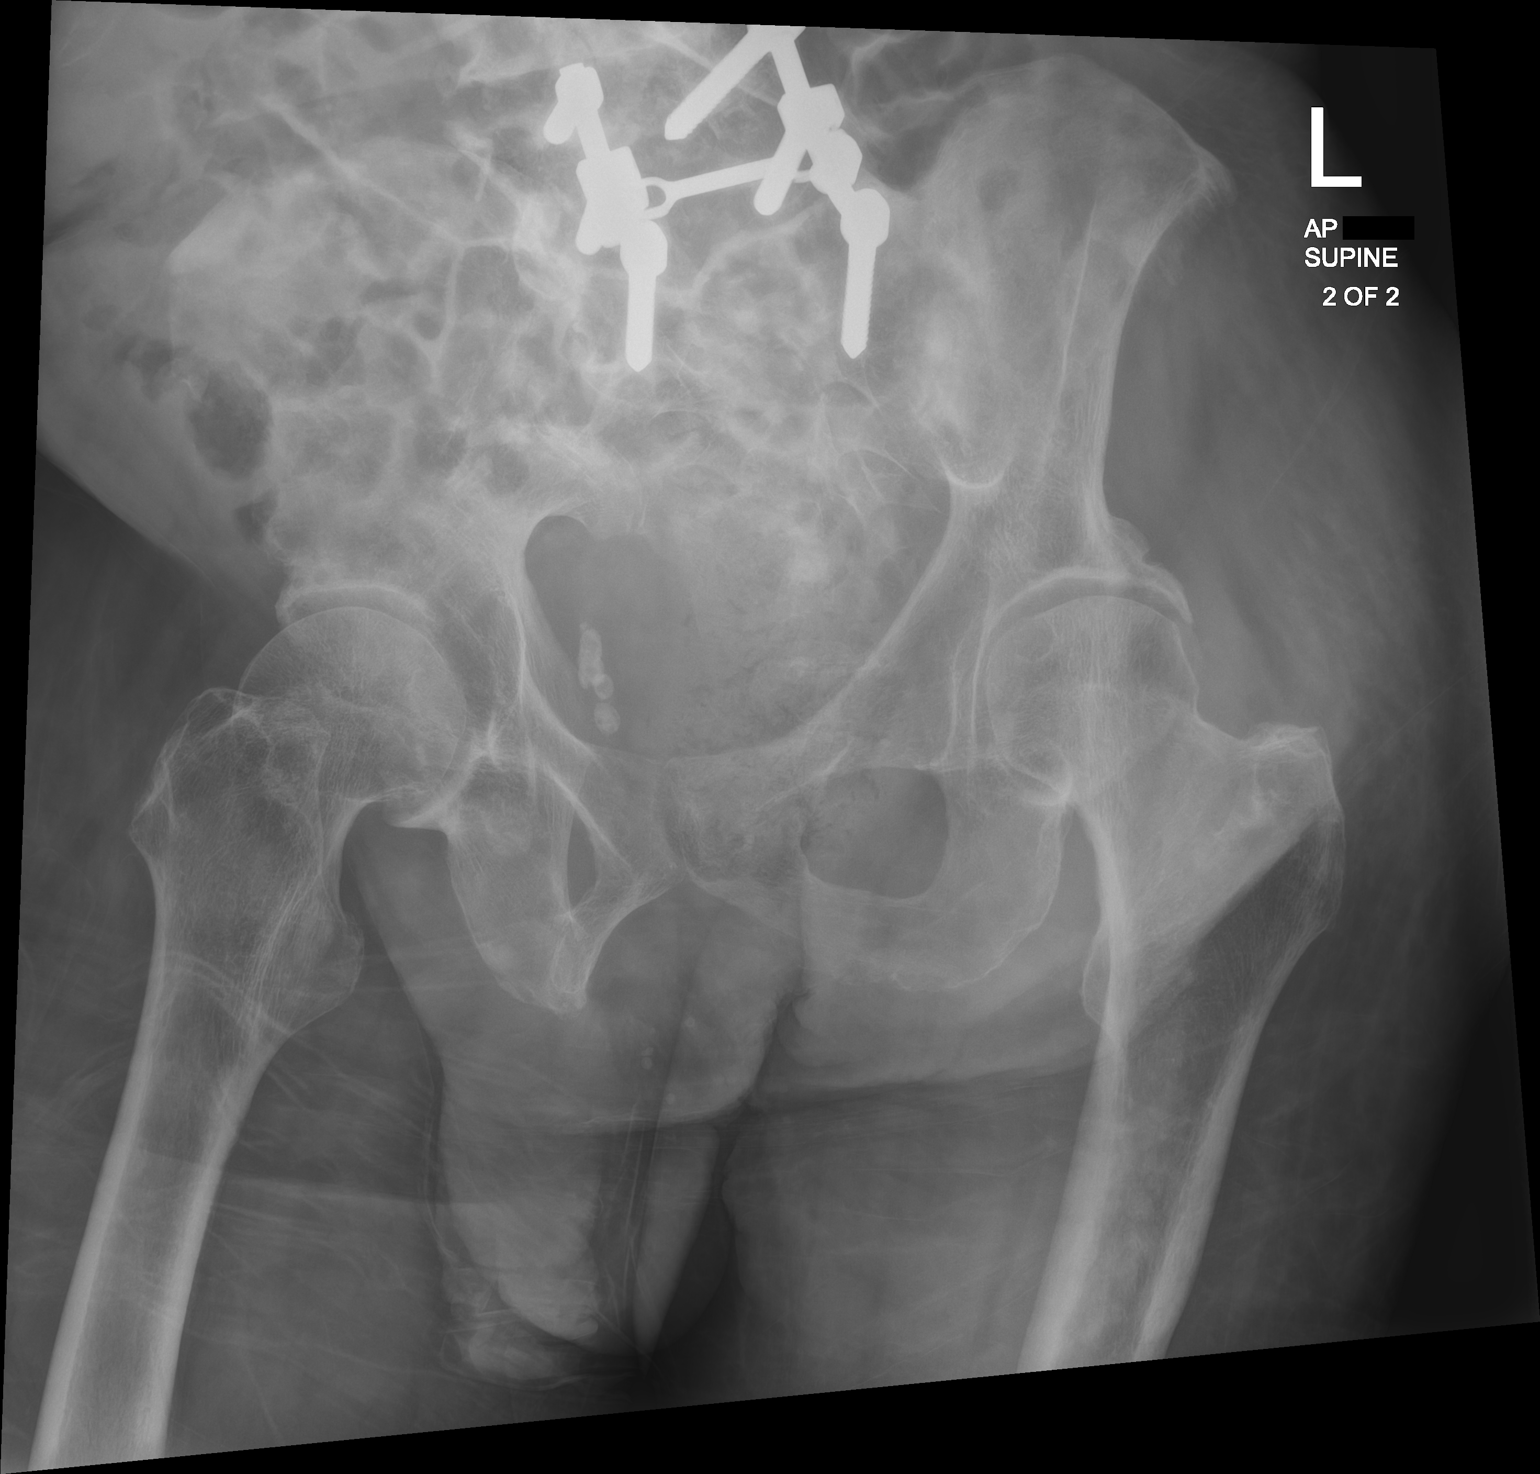

[2 of 2 positions shown; findings below may reference images not displayed]

FINDINGS: Diffuse gaseous distension bowel without frank dilation. Moderate
rectal stool ball. IVC filter. Status post posterior fixation of the
inferior lumbar spine. Heterogeneous mixed lytic and sclerotic
appearance of the inferior RIGHT pubic ramus, multiple ribs, and the
spine consistent with known osseous metastatic disease. There is an
infiltrative sclerotic appearance of the proximal LEFT femoral
shaft. Pelvic phleboliths.
IMPRESSION: 1. Nonobstructive bowel gas pattern with moderate rectal stool ball.
If persistent concern for obstruction, consider further evaluation
with dedicated CT.
2. Infiltrative sclerotic appearance the proximal LEFT femoral
shaft. Findings are concerning for additional site of osseous
metastatic disease. This could be further evaluated with dedicated
femur radiographs or bone scan.

## 2021-06-03 MED ORDER — POTASSIUM CHLORIDE CRYS ER 20 MEQ PO TBCR
40.0000 meq | EXTENDED_RELEASE_TABLET | ORAL | Status: AC
  Administered 2021-06-03 (×2): 40 meq via ORAL
  Filled 2021-06-03 (×2): qty 2

## 2021-06-03 NOTE — Progress Notes (Signed)
PROGRESS NOTE    Terry Mitchell  UMP:[id number] DOB: 1950/11/05 DOA: 05/26/2021 PCP: [name], MD   Brief Narrative: Terry Mitchell is a 70 y.o. male with medical history significant of metastatic prostate cancer with mets to the bones.  He had finished his radiation therapy approximately a week ago. Patient presented secondary to generalized weakness and decreased oral intake. On admission, he was found to have evidence of pneumonia.   Assessment & Plan:   Active Problems:   Prostate cancer metastatic to bone ([place])   Paraplegia (HCC)   Thrombocytopenia (HCC)   CAP (community acquired pneumonia)   Elevated serum creatinine   Community acquired pneumonia Chest x-ray consistent with right sided infiltrate concerning for pneumonia. Started empirically on Levaquin. Completed course.  AKI Baseline creatinine of about 1.2-1.3. Creatinine of 3.27 on admission. Resolved with IV fluids.  Hypokalemia -Replete as needed  Hypercalcemia In setting of malignancy. Resolved with IV fluids.  Pancytopenia Transient leukopenia. Now resolved.  Acute on chronic anemia Baseline hemoglobin of about 10-11. Hemoglobin of 7.5 on admission with decrease to 6.4 after IV fluids. Patient received 1 unit of PRBC  on 11/4 and is now stable.  Thrombocytopenia Chronic and stable.  Elevated alkaline phosphatase Secondary to bony metastatic disease.  Metastatic prostate cancer Spine metastasis Patient follows at [facility]. History of chemo/radiation. Previously on hospice, now revoked. Palliative care consulted this admission. -Palliative care recommendations: outpatient palliative care  Dysphagia Presumed secondary to radiation induced esophagitis. -Continue Magic mouthwash -Dysphagia 2 diet  Paraplegia Secondary to metastatic disease to spine. PT recommending SNF.  Left peroneal/popliteal DVT History of IVC filter placement at [facility]. On Eliquis -Continue  Eliquis  Peripheral edema Left arm likely secondary to IV. No findings concerning for DVT. Already on Eliquis.  Abdominal distension/pain Possibly constipation related. -Abdominal x-ray   DVT prophylaxis: Eliquis Code Status:   Code Status: DNR Family Communication: Cousin and daughter at bedside Disposition Plan: Discharge to SNF if able to find a bed. If unable, may need to discharge home.   Consultants:  Palliative care  Procedures:  BLOOD TRANSFUSION; 1 UNIT (11/4)  Antimicrobials: Levaquin    Subjective: No issues overnight. Patient has decreased appetite with feeling of fullness when attempting to eat. No solid bowel movements.  Objective: Vitals:   06/02/21 0515 06/02/21 1351 06/02/21 2014 06/03/21 0456  BP: 113/64 121/68 120/71 124/66  Pulse: (!) 101 100 96 97  Resp: 20 18 16 20   Temp: 98.8 F (37.1 C) 98.6 F (37 C) 97.6 F (36.4 C) (!) 97.4 F (36.3 C)  TempSrc: Oral  Oral Oral  SpO2: 96% 95% 95% 95%  Weight:      Height:        Intake/Output Summary (Last 24 hours) at 06/03/2021 1305 Last data filed at 06/03/2021 1000 Gross per 24 hour  Intake 480 ml  Output 350 ml  Net 130 ml    Filed Weights   05/26/21 1349  Weight: 89 kg    Examination:  General exam: Appears calm and comfortable Respiratory system: Clear to auscultation. Respiratory effort normal. Cardiovascular system: S1 & S2 heard, RRR. No murmurs, rubs, gallops or clicks. Gastrointestinal system: Abdomen is distended, soft and mildly tender. No organomegaly or masses felt. Normal bowel sounds heard. Central nervous system: Alert and oriented. No focal neurological deficits. Musculoskeletal: LUE and BLE edema. No calf tenderness Skin: No cyanosis. No rashes Psychiatry: Judgement and insight appear normal. Mood & affect appropriate.  Data Reviewed: I have personally reviewed following labs and imaging studies  CBC Lab Results  Component Value Date   WBC 4.1 05/31/2021    RBC 2.72 (L) 05/31/2021   HGB 8.4 (L) 05/31/2021   HCT 27.3 (L) 05/31/2021   MCV 100.4 (H) 05/31/2021   MCH 30.9 05/31/2021   PLT 107 (L) 05/31/2021   MCHC 30.8 05/31/2021   RDW 18.6 (H) 05/31/2021   LYMPHSABS 0.3 (L) 05/26/2021   MONOABS 0.4 05/26/2021   EOSABS 0.0 05/26/2021   BASOSABS 0.0 12/45/8099     Last metabolic panel Lab Results  Component Value Date   NA 140 06/01/2021   K 2.9 (L) 06/02/2021   CL 108 06/01/2021   CO2 24 06/01/2021   BUN 20 06/01/2021   CREATININE 1.01 06/01/2021   GLUCOSE 100 (H) 06/01/2021   GFRNONAA >60 06/01/2021   GFRAA 50 (L) 04/06/2020   CALCIUM 8.8 (L) 06/01/2021   PHOS 2.3 (L) 06/01/2021   PROT 4.8 (L) 05/28/2021   ALBUMIN 2.1 (L) 06/01/2021   BILITOT 1.0 05/28/2021   ALKPHOS 500 (H) 05/28/2021   AST 36 05/28/2021   ALT 10 05/28/2021   ANIONGAP 8 06/01/2021    CBG (last 3)  No results for input(s): GLUCAP in the last 72 hours.   GFR: Estimated Creatinine Clearance: 76.8 mL/min (by C-G formula based on SCr of 1.01 mg/dL).  Coagulation Profile: No results for input(s): INR, PROTIME in the last 168 hours.  Recent Results (from the past 240 hour(s))  Culture, blood (Routine X 2) w Reflex to ID Panel     Status: None   Collection Time: 05/26/21  2:18 PM   Specimen: BLOOD  Result Value Ref Range Status   Specimen Description   Final    BLOOD RIGHT ANTECUBITAL Performed at [facility] [address], [place], [place] [address]    Special Requests   Final    BOTTLES DRAWN AEROBIC AND ANAEROBIC Blood Culture results may not be optimal due to an excessive volume of blood received in culture bottles Performed at [facility] [address] [place], [place] [address]    Culture   Final    NO GROWTH 5 DAYS Performed at [facility], [place] [address], [place], [place] [address]    Report Status 05/31/2021 FINAL  Final  Culture, blood (Routine X 2) w Reflex to ID Panel     Status: None    Collection Time: 05/26/21  2:23 PM   Specimen: BLOOD  Result Value Ref Range Status   Specimen Description   Final    BLOOD LEFT ANTECUBITAL Performed at [facility] [address] [place], [place] [address]    Special Requests   Final    BOTTLES DRAWN AEROBIC AND ANAEROBIC Blood Culture adequate volume Performed at [facility] [address], [place], [place] [address]    Culture   Final    NO GROWTH 5 DAYS Performed at [facility], [place] [address], [place], [place] [address]    Report Status 05/31/2021 FINAL  Final  Resp Panel by RT-PCR (Flu A&B, Covid) Nasopharyngeal Swab     Status: None   Collection Time: 05/26/21  3:30 PM   Specimen: Nasopharyngeal Swab; Nasopharyngeal(NP) swabs in vial transport medium  Result Value Ref Range Status   SARS Coronavirus 2 by RT PCR NEGATIVE NEGATIVE Final    Comment: (NOTE) SARS-CoV-2 target nucleic acids are NOT DETECTED.  The  SARS-CoV-2 RNA is generally detectable in upper respiratory specimens during the acute phase of infection. The lowest concentration of SARS-CoV-2 viral copies this assay can detect is 138 copies/mL. A negative result does not preclude SARS-Cov-2 infection and should not be used as the sole basis for treatment or other patient management decisions. A negative result may occur with  improper specimen collection/handling, submission of specimen other than nasopharyngeal swab, presence of viral mutation(s) within the areas targeted by this assay, and inadequate number of viral copies(<138 copies/mL). A negative result must be combined with clinical observations, patient history, and epidemiological information. The expected result is Negative.  Fact Sheet for Patients:  EntrepreneurPulse.com.au  Fact Sheet for Healthcare Providers:  IncredibleEmployment.be  This test is no t yet approved or cleared by the [place] FDA and   has been authorized for detection and/or diagnosis of SARS-CoV-2 by FDA under an Emergency Use Authorization (EUA). This EUA will remain  in effect (meaning this test can be used) for the duration of the COVID-19 declaration under Section 564(b)(1) of the Act, 21 U.S.C.section 360bbb-3(b)(1), unless the authorization is terminated  or revoked sooner.       Influenza A by PCR NEGATIVE NEGATIVE Final   Influenza B by PCR NEGATIVE NEGATIVE Final    Comment: (NOTE) The Xpert Xpress SARS-CoV-2/FLU/RSV plus assay is intended as an aid in the diagnosis of influenza from Nasopharyngeal swab specimens and should not be used as a sole basis for treatment. Nasal washings and aspirates are unacceptable for Xpert Xpress SARS-CoV-2/FLU/RSV testing.  Fact Sheet for Patients: EntrepreneurPulse.com.au  Fact Sheet for Healthcare Providers: IncredibleEmployment.be  This test is not yet approved or cleared by the [place] FDA and has been authorized for detection and/or diagnosis of SARS-CoV-2 by FDA under an Emergency Use Authorization (EUA). This EUA will remain in effect (meaning this test can be used) for the duration of the COVID-19 declaration under Section 564(b)(1) of the Act, 21 U.S.C. section 360bbb-3(b)(1), unless the authorization is terminated or revoked.  Performed at [facility], [place] [address], [place], [place] [address]   Resp Panel by RT-PCR (Flu A&B, Covid) Nasopharyngeal Swab     Status: None   Collection Time: 06/01/21  9:42 AM   Specimen: Nasopharyngeal Swab; Nasopharyngeal(NP) swabs in vial transport medium  Result Value Ref Range Status   SARS Coronavirus 2 by RT PCR NEGATIVE NEGATIVE Final    Comment: (NOTE) SARS-CoV-2 target nucleic acids are NOT DETECTED.  The SARS-CoV-2 RNA is generally detectable in upper respiratory specimens during the acute phase of infection. The lowest concentration of SARS-CoV-2  viral copies this assay can detect is 138 copies/mL. A negative result does not preclude SARS-Cov-2 infection and should not be used as the sole basis for treatment or other patient management decisions. A negative result may occur with  improper specimen collection/handling, submission of specimen other than nasopharyngeal swab, presence of viral mutation(s) within the areas targeted by this assay, and inadequate number of viral copies(<138 copies/mL). A negative result must be combined with clinical observations, patient history, and epidemiological information. The expected result is Negative.  Fact Sheet for Patients:  EntrepreneurPulse.com.au  Fact Sheet for Healthcare Providers:  IncredibleEmployment.be  This test is no t yet approved or cleared by the [place] FDA and  has been authorized for detection and/or diagnosis of SARS-CoV-2 by FDA under an Emergency Use Authorization (EUA). This EUA will remain  in effect (meaning this test can be used) for the duration of  the COVID-19 declaration under Section 564(b)(1) of the Act, 21 U.S.C.section 360bbb-3(b)(1), unless the authorization is terminated  or revoked sooner.       Influenza A by PCR NEGATIVE NEGATIVE Final   Influenza B by PCR NEGATIVE NEGATIVE Final    Comment: (NOTE) The Xpert Xpress SARS-CoV-2/FLU/RSV plus assay is intended as an aid in the diagnosis of influenza from Nasopharyngeal swab specimens and should not be used as a sole basis for treatment. Nasal washings and aspirates are unacceptable for Xpert Xpress SARS-CoV-2/FLU/RSV testing.  Fact Sheet for Patients: EntrepreneurPulse.com.au  Fact Sheet for Healthcare Providers: IncredibleEmployment.be  This test is not yet approved or cleared by the [place] FDA and has been authorized for detection and/or diagnosis of SARS-CoV-2 by FDA under an Emergency Use Authorization  (EUA). This EUA will remain in effect (meaning this test can be used) for the duration of the COVID-19 declaration under Section 564(b)(1) of the Act, 21 U.S.C. section 360bbb-3(b)(1), unless the authorization is terminated or revoked.  Performed at [facility], [place] [address], [place], [place] [address]         Radiology Studies: No results found.      Scheduled Meds:  (feeding supplement) PROSource Plus  30 mL Oral BID BM   amLODipine  7.5 mg Oral Daily   apixaban  5 mg Oral BID   feeding supplement  237 mL Oral BID BM   magic mouthwash w/lidocaine  5 mL Oral QID   multivitamin with minerals  1 tablet Oral Daily   sucralfate  1 g Oral TID WC & HS   Continuous Infusions:   LOS: 8 days     Cordelia Poche, MD [facility] 06/03/2021, 1:05 PM  If 7PM-7AM, please contact night-coverage [email or web address]

## 2021-06-04 DIAGNOSIS — J189 Pneumonia, unspecified organism: Secondary | ICD-10-CM | POA: Diagnosis not present

## 2021-06-04 DIAGNOSIS — E876 Hypokalemia: Secondary | ICD-10-CM | POA: Diagnosis not present

## 2021-06-04 DIAGNOSIS — G8220 Paraplegia, unspecified: Secondary | ICD-10-CM | POA: Diagnosis not present

## 2021-06-04 LAB — BASIC METABOLIC PANEL
Anion gap: 6 (ref 5–15)
BUN: 21 mg/dL (ref 8–23)
CO2: 27 mmol/L (ref 22–32)
Calcium: 8.8 mg/dL — ABNORMAL LOW (ref 8.9–10.3)
Chloride: 107 mmol/L (ref 98–111)
Creatinine, Ser: 0.95 mg/dL (ref 0.61–1.24)
GFR, Estimated: >60 mL/min (ref >60)
Glucose, Bld: 101 mg/dL — ABNORMAL HIGH (ref 70–99)
Potassium: 3.6 mmol/L (ref 3.5–5.1)
Sodium: 140 mmol/L (ref 135–145)

## 2021-06-04 MED ORDER — BISACODYL 10 MG RE SUPP
10.0000 mg | Freq: Once | RECTAL | Status: AC
  Administered 2021-06-04: 10 mg via RECTAL
  Filled 2021-06-04: qty 1

## 2021-06-04 MED ORDER — POLYETHYLENE GLYCOL 3350 17 G PO PACK
17.0000 g | PACK | Freq: Every day | ORAL | Status: DC
  Administered 2021-06-05 – 2021-06-09 (×2): 17 g via ORAL
  Filled 2021-06-04 (×3): qty 1

## 2021-06-04 NOTE — Progress Notes (Signed)
PROGRESS NOTE    Terry Mitchell  OIZ:[id number] DOB: 1951/02/25 DOA: 05/26/2021 PCP: [name], MD   Brief Narrative: Terry Mitchell is a 70 y.o. male with medical history significant of metastatic prostate cancer with mets to the bones.  He had finished his radiation therapy approximately a week ago. Patient presented secondary to generalized weakness and decreased oral intake. On admission, he was found to have evidence of pneumonia.   Assessment & Plan:   Active Problems:   Prostate cancer metastatic to bone ([place])   Paraplegia (HCC)   Thrombocytopenia (HCC)   CAP (community acquired pneumonia)   Elevated serum creatinine   Community acquired pneumonia Chest x-ray consistent with right sided infiltrate concerning for pneumonia. Started empirically on Levaquin. Completed course.  AKI Baseline creatinine of about 1.2-1.3. Creatinine of 3.27 on admission. Resolved with IV fluids.  Hypokalemia -Replete as needed  Hypercalcemia In setting of malignancy. Resolved with IV fluids.  Pancytopenia Transient leukopenia. Now resolved.  Acute on chronic anemia Baseline hemoglobin of about 10-11. Hemoglobin of 7.5 on admission with decrease to 6.4 after IV fluids. Patient received 1 unit of PRBC  on 11/4 and is now stable.  Thrombocytopenia Chronic and stable.  Elevated alkaline phosphatase Secondary to bony metastatic disease.  Metastatic prostate cancer Spine metastasis Patient follows at [facility]. History of chemo/radiation. Previously on hospice, now revoked. Palliative care consulted this admission. -Palliative care recommendations: outpatient palliative care  Dysphagia Presumed secondary to radiation induced esophagitis. -Continue Magic mouthwash -Dysphagia 2 diet  Paraplegia Secondary to metastatic disease to spine. PT recommending SNF.  Left peroneal/popliteal DVT History of IVC filter placement at [facility]. On Eliquis -Continue  Eliquis  Peripheral edema Left arm likely secondary to IV. No findings concerning for DVT. Already on Eliquis.  Abdominal distension/pain Possibly constipation related. Abdominal x-ray significant for moderate rectal stool ball -Dulcolax suppository -If worsening symptoms, will obtain a CT abdomen/pelvis   DVT prophylaxis: Eliquis Code Status:   Code Status: DNR Family Communication: None at bedside Disposition Plan: Discharge to SNF if able to find a bed. If unable, may need to discharge home.   Consultants:  Palliative care  Procedures:  BLOOD TRANSFUSION; 1 UNIT (11/4)  Antimicrobials: Levaquin    Subjective: No concerns overnight. Patient reports loose stools last night but nothing solid. No abdominal pain, nausea or vomiting.  Objective: Vitals:   06/03/21 1309 06/03/21 2150 06/04/21 0609 06/04/21 1127  BP: 117/73 121/70 134/64 118/68  Pulse: 96 95 93 95  Resp: 18 18 18 15   Temp: 98.4 F (36.9 C) 98.2 F (36.8 C) 98.6 F (37 C) 98.2 F (36.8 C)  TempSrc:  Oral Oral   SpO2: 95% 92% 91% 95%  Weight:      Height:        Intake/Output Summary (Last 24 hours) at 06/04/2021 1141 Last data filed at 06/04/2021 0943 Gross per 24 hour  Intake 480 ml  Output 350 ml  Net 130 ml    Filed Weights   05/26/21 1349  Weight: 89 kg    Examination:  General exam: Appears calm and comfortable Respiratory system: Clear to auscultation. Respiratory effort normal. Cardiovascular system: S1 & S2 heard, RRR. No murmurs, rubs, gallops or clicks. Gastrointestinal system: Abdomen is distended, soft and nontender. No organomegaly or masses felt. Normal bowel sounds heard. Central nervous system: Alert and oriented. No focal neurological deficits. Musculoskeletal: No calf tenderness Skin: No cyanosis. No rashes Psychiatry: Judgement and insight appear normal. Mood &  affect appropriate.   Data Reviewed: I have personally reviewed following labs and imaging  studies  CBC Lab Results  Component Value Date   WBC 4.1 05/31/2021   RBC 2.72 (L) 05/31/2021   HGB 8.4 (L) 05/31/2021   HCT 27.3 (L) 05/31/2021   MCV 100.4 (H) 05/31/2021   MCH 30.9 05/31/2021   PLT 107 (L) 05/31/2021   MCHC 30.8 05/31/2021   RDW 18.6 (H) 05/31/2021   LYMPHSABS 0.3 (L) 05/26/2021   MONOABS 0.4 05/26/2021   EOSABS 0.0 05/26/2021   BASOSABS 0.0 99/37/1696     Last metabolic panel Lab Results  Component Value Date   NA 140 06/04/2021   K 3.6 06/04/2021   CL 107 06/04/2021   CO2 27 06/04/2021   BUN 21 06/04/2021   CREATININE 0.95 06/04/2021   GLUCOSE 101 (H) 06/04/2021   GFRNONAA >60 06/04/2021   GFRAA 50 (L) 04/06/2020   CALCIUM 8.8 (L) 06/04/2021   PHOS 2.3 (L) 06/01/2021   PROT 4.8 (L) 05/28/2021   ALBUMIN 2.1 (L) 06/01/2021   BILITOT 1.0 05/28/2021   ALKPHOS 500 (H) 05/28/2021   AST 36 05/28/2021   ALT 10 05/28/2021   ANIONGAP 6 06/04/2021    CBG (last 3)  No results for input(s): GLUCAP in the last 72 hours.   GFR: Estimated Creatinine Clearance: 81.7 mL/min (by C-G formula based on SCr of 0.95 mg/dL).  Coagulation Profile: No results for input(s): INR, PROTIME in the last 168 hours.  Recent Results (from the past 240 hour(s))  Culture, blood (Routine X 2) w Reflex to ID Panel     Status: None   Collection Time: 05/26/21  2:18 PM   Specimen: BLOOD  Result Value Ref Range Status   Specimen Description   Final    BLOOD RIGHT ANTECUBITAL Performed at [facility] [address] [place], [place] [address]    Special Requests   Final    BOTTLES DRAWN AEROBIC AND ANAEROBIC Blood Culture results may not be optimal due to an excessive volume of blood received in culture bottles Performed at [facility] [address], [place], [place] [address]    Culture   Final    NO GROWTH 5 DAYS Performed at [facility], [place] [address], [place], [place] [address]    Report Status 05/31/2021 FINAL   Final  Culture, blood (Routine X 2) w Reflex to ID Panel     Status: None   Collection Time: 05/26/21  2:23 PM   Specimen: BLOOD  Result Value Ref Range Status   Specimen Description   Final    BLOOD LEFT ANTECUBITAL Performed at [facility] [address] [place], [place] [address]    Special Requests   Final    BOTTLES DRAWN AEROBIC AND ANAEROBIC Blood Culture adequate volume Performed at [facility] [address] [place], [place] [address]    Culture   Final    NO GROWTH 5 DAYS Performed at [facility], [place] [address], [place], [place] [address]    Report Status 05/31/2021 FINAL  Final  Resp Panel by RT-PCR (Flu A&B, Covid) Nasopharyngeal Swab     Status: None   Collection Time: 05/26/21  3:30 PM   Specimen: Nasopharyngeal Swab; Nasopharyngeal(NP) swabs in vial transport medium  Result Value Ref Range Status   SARS Coronavirus 2 by RT PCR NEGATIVE NEGATIVE Final    Comment: (NOTE) SARS-CoV-2 target nucleic acids are NOT  DETECTED.  The SARS-CoV-2 RNA is generally detectable in upper respiratory specimens during the acute phase of infection. The lowest concentration of SARS-CoV-2 viral copies this assay can detect is 138 copies/mL. A negative result does not preclude SARS-Cov-2 infection and should not be used as the sole basis for treatment or other patient management decisions. A negative result may occur with  improper specimen collection/handling, submission of specimen other than nasopharyngeal swab, presence of viral mutation(s) within the areas targeted by this assay, and inadequate number of viral copies(<138 copies/mL). A negative result must be combined with clinical observations, patient history, and epidemiological information. The expected result is Negative.  Fact Sheet for Patients:  EntrepreneurPulse.com.au  Fact Sheet for Healthcare Providers:   IncredibleEmployment.be  This test is no t yet approved or cleared by the [place] FDA and  has been authorized for detection and/or diagnosis of SARS-CoV-2 by FDA under an Emergency Use Authorization (EUA). This EUA will remain  in effect (meaning this test can be used) for the duration of the COVID-19 declaration under Section 564(b)(1) of the Act, 21 U.S.C.section 360bbb-3(b)(1), unless the authorization is terminated  or revoked sooner.       Influenza A by PCR NEGATIVE NEGATIVE Final   Influenza B by PCR NEGATIVE NEGATIVE Final    Comment: (NOTE) The Xpert Xpress SARS-CoV-2/FLU/RSV plus assay is intended as an aid in the diagnosis of influenza from Nasopharyngeal swab specimens and should not be used as a sole basis for treatment. Nasal washings and aspirates are unacceptable for Xpert Xpress SARS-CoV-2/FLU/RSV testing.  Fact Sheet for Patients: EntrepreneurPulse.com.au  Fact Sheet for Healthcare Providers: IncredibleEmployment.be  This test is not yet approved or cleared by the [place] FDA and has been authorized for detection and/or diagnosis of SARS-CoV-2 by FDA under an Emergency Use Authorization (EUA). This EUA will remain in effect (meaning this test can be used) for the duration of the COVID-19 declaration under Section 564(b)(1) of the Act, 21 U.S.C. section 360bbb-3(b)(1), unless the authorization is terminated or revoked.  Performed at [facility], [place] [address], [place], [place] [address]   Resp Panel by RT-PCR (Flu A&B, Covid) Nasopharyngeal Swab     Status: None   Collection Time: 06/01/21  9:42 AM   Specimen: Nasopharyngeal Swab; Nasopharyngeal(NP) swabs in vial transport medium  Result Value Ref Range Status   SARS Coronavirus 2 by RT PCR NEGATIVE NEGATIVE Final    Comment: (NOTE) SARS-CoV-2 target nucleic acids are NOT DETECTED.  The SARS-CoV-2 RNA is generally  detectable in upper respiratory specimens during the acute phase of infection. The lowest concentration of SARS-CoV-2 viral copies this assay can detect is 138 copies/mL. A negative result does not preclude SARS-Cov-2 infection and should not be used as the sole basis for treatment or other patient management decisions. A negative result may occur with  improper specimen collection/handling, submission of specimen other than nasopharyngeal swab, presence of viral mutation(s) within the areas targeted by this assay, and inadequate number of viral copies(<138 copies/mL). A negative result must be combined with clinical observations, patient history, and epidemiological information. The expected result is Negative.  Fact Sheet for Patients:  EntrepreneurPulse.com.au  Fact Sheet for Healthcare Providers:  IncredibleEmployment.be  This test is no t yet approved or cleared by the [place] FDA and  has been authorized for detection and/or diagnosis of SARS-CoV-2 by FDA under an Emergency Use Authorization (EUA). This EUA will remain  in effect (meaning this test can be used) for  the duration of the COVID-19 declaration under Section 564(b)(1) of the Act, 21 U.S.C.section 360bbb-3(b)(1), unless the authorization is terminated  or revoked sooner.       Influenza A by PCR NEGATIVE NEGATIVE Final   Influenza B by PCR NEGATIVE NEGATIVE Final    Comment: (NOTE) The Xpert Xpress SARS-CoV-2/FLU/RSV plus assay is intended as an aid in the diagnosis of influenza from Nasopharyngeal swab specimens and should not be used as a sole basis for treatment. Nasal washings and aspirates are unacceptable for Xpert Xpress SARS-CoV-2/FLU/RSV testing.  Fact Sheet for Patients: EntrepreneurPulse.com.au  Fact Sheet for Healthcare Providers: IncredibleEmployment.be  This test is not yet approved or cleared by the [place] FDA  and has been authorized for detection and/or diagnosis of SARS-CoV-2 by FDA under an Emergency Use Authorization (EUA). This EUA will remain in effect (meaning this test can be used) for the duration of the COVID-19 declaration under Section 564(b)(1) of the Act, 21 U.S.C. section 360bbb-3(b)(1), unless the authorization is terminated or revoked.  Performed at [facility], [place] [address] [place], [place] [address]         Radiology Studies: DG Abd Portable 1V  Result Date: 06/03/2021 CLINICAL DATA:  Abdominal distension EXAM: PORTABLE ABDOMEN - 1 VIEW COMPARISON:  Dec 13, 2020 FINDINGS: Diffuse gaseous distension bowel without frank dilation. Moderate rectal stool ball. IVC filter. Status post posterior fixation of the inferior lumbar spine. Heterogeneous mixed lytic and sclerotic appearance of the inferior RIGHT pubic ramus, multiple ribs, and the spine consistent with known osseous metastatic disease. There is an infiltrative sclerotic appearance of the proximal LEFT femoral shaft. Pelvic phleboliths. IMPRESSION: 1. Nonobstructive bowel gas pattern with moderate rectal stool ball. If persistent concern for obstruction, consider further evaluation with dedicated CT. 2. Infiltrative sclerotic appearance the proximal LEFT femoral shaft. Findings are concerning for additional site of osseous metastatic disease. This could be further evaluated with dedicated femur radiographs or bone scan. Electronically Signed   By: Valentino Saxon M.D.   On: 06/03/2021 15:43        Scheduled Meds:  (feeding supplement) PROSource Plus  30 mL Oral BID BM   amLODipine  7.5 mg Oral Daily   apixaban  5 mg Oral BID   feeding supplement  237 mL Oral BID BM   magic mouthwash w/lidocaine  5 mL Oral QID   multivitamin with minerals  1 tablet Oral Daily   sucralfate  1 g Oral TID WC & HS   Continuous Infusions:   LOS: 9 days     Cordelia Poche, MD [facility] 06/04/2021,  11:41 AM  If 7PM-7AM, please contact night-coverage [email or web address]

## 2021-06-04 NOTE — TOC Progression Note (Signed)
Transition of Care [facility]) - Progression Note    Patient Details  Name: Qusay Villada MRN: [id number] Date of Birth: 20-Sep-1950  Transition of Care [facility]) CM/SW Contact  Ross Ludwig, [place] Phone Number: 06/04/2021, 4:23 PM  Clinical Narrative:    Patient's information has been faxed out to surrounding county SNFs awaiting for bed offers.   Expected Discharge Plan: Skilled Nursing Facility Barriers to Discharge: Other (must enter comment) (h/o criminal charges preventing accepted to SNF)  Expected Discharge Plan and Services Expected Discharge Plan: [place] In-house Referral: Clinical Social Work   Post Acute Care Choice: [place] arrangements for the past 2 months: Single Family Home Expected Discharge Date: 06/01/21                                     Social Determinants of Health (SDOH) Interventions    Readmission Risk Interventions No flowsheet data found.

## 2021-06-05 DIAGNOSIS — E876 Hypokalemia: Secondary | ICD-10-CM | POA: Diagnosis not present

## 2021-06-05 DIAGNOSIS — G8220 Paraplegia, unspecified: Secondary | ICD-10-CM | POA: Diagnosis not present

## 2021-06-05 DIAGNOSIS — J189 Pneumonia, unspecified organism: Secondary | ICD-10-CM | POA: Diagnosis not present

## 2021-06-05 NOTE — Progress Notes (Signed)
PROGRESS NOTE    Terry Mitchell  IRJ:[id number] DOB: 1950-09-12 DOA: 05/26/2021 PCP: [name], MD   Brief Narrative: Terry Mitchell is a 70 y.o. male with medical history significant of metastatic prostate cancer with mets to the bones.  He had finished his radiation therapy approximately a week ago. Patient presented secondary to generalized weakness and decreased oral intake. On admission, he was found to have evidence of pneumonia.   Assessment & Plan:   Active Problems:   Prostate cancer metastatic to bone ([place])   Paraplegia (HCC)   Thrombocytopenia (HCC)   CAP (community acquired pneumonia)   Elevated serum creatinine   Community acquired pneumonia Chest x-ray consistent with right sided infiltrate concerning for pneumonia. Started empirically on Levaquin. Completed course.  AKI Baseline creatinine of about 1.2-1.3. Creatinine of 3.27 on admission. Resolved with IV fluids.  Hypokalemia -Replete as needed  Hypercalcemia In setting of malignancy. Resolved with IV fluids.  Pancytopenia Transient leukopenia. Now resolved.  Acute on chronic anemia Baseline hemoglobin of about 10-11. Hemoglobin of 7.5 on admission with decrease to 6.4 after IV fluids. Patient received 1 unit of PRBC  on 11/4 and is now stable.  Thrombocytopenia Chronic and stable.  Elevated alkaline phosphatase Secondary to bony metastatic disease.  Metastatic prostate cancer Spine metastasis Patient follows at [facility]. History of chemo/radiation. Previously on hospice, now revoked. Palliative care consulted this admission. -Palliative care recommendations: outpatient palliative care  Dysphagia Presumed secondary to radiation induced esophagitis. -Continue Magic mouthwash -Dysphagia 2 diet  Paraplegia Secondary to metastatic disease to spine. PT recommending SNF.  Left peroneal/popliteal DVT History of IVC filter placement at [facility]. On Eliquis -Continue  Eliquis  Peripheral edema Left arm likely secondary to IV. No findings concerning for DVT. Already on Eliquis.  Abdominal distension/pain Possibly constipation related. Abdominal x-ray significant for moderate rectal stool ball. Given a dulcolax suppository -If worsening symptoms, will obtain a CT abdomen/pelvis   DVT prophylaxis: Eliquis Code Status:   Code Status: DNR Family Communication: None at bedside Disposition Plan: Discharge to SNF if able to find a bed. If unable, may need to discharge home.   Consultants:  Palliative care  Procedures:  BLOOD TRANSFUSION; 1 UNIT (11/4)  Antimicrobials: Levaquin    Subjective: Small bowel movement last night. No other concern. No abdominal pain.  Objective: Vitals:   06/04/21 1127 06/04/21 1309 06/04/21 2248 06/05/21 0530  BP: 118/68 107/68 123/66 128/74  Pulse: 95 92 98 97  Resp: 15 16 18 17   Temp: 98.2 F (36.8 C) 98.7 F (37.1 C) 98.5 F (36.9 C) 98.6 F (37 C)  TempSrc:   Oral Oral  SpO2: 95% 93% 92% 95%  Weight:      Height:        Intake/Output Summary (Last 24 hours) at 06/05/2021 1313 Last data filed at 06/05/2021 1000 Gross per 24 hour  Intake 600 ml  Output 700 ml  Net -100 ml    Filed Weights   05/26/21 1349  Weight: 89 kg    Examination:  General exam: Appears calm and comfortable Respiratory system: Clear to auscultation. Respiratory effort normal. Cardiovascular system: S1 & S2 heard, RRR. No murmurs, rubs, gallops or clicks. Gastrointestinal system: Abdomen is slightly distended, soft and nontender. No organomegaly or masses felt. Normal bowel sounds heard. Central nervous system: Alert and oriented. Musculoskeletal: LUE edema. No calf tenderness Skin: No cyanosis. No rashes Psychiatry: Judgement and insight appear normal. Mood & affect appropriate.  Data Reviewed: I have personally reviewed following labs and imaging studies  CBC Lab Results  Component Value Date   WBC 4.1  05/31/2021   RBC 2.72 (L) 05/31/2021   HGB 8.4 (L) 05/31/2021   HCT 27.3 (L) 05/31/2021   MCV 100.4 (H) 05/31/2021   MCH 30.9 05/31/2021   PLT 107 (L) 05/31/2021   MCHC 30.8 05/31/2021   RDW 18.6 (H) 05/31/2021   LYMPHSABS 0.3 (L) 05/26/2021   MONOABS 0.4 05/26/2021   EOSABS 0.0 05/26/2021   BASOSABS 0.0 73/71/0626     Last metabolic panel Lab Results  Component Value Date   NA 140 06/04/2021   K 3.6 06/04/2021   CL 107 06/04/2021   CO2 27 06/04/2021   BUN 21 06/04/2021   CREATININE 0.95 06/04/2021   GLUCOSE 101 (H) 06/04/2021   GFRNONAA >60 06/04/2021   GFRAA 50 (L) 04/06/2020   CALCIUM 8.8 (L) 06/04/2021   PHOS 2.3 (L) 06/01/2021   PROT 4.8 (L) 05/28/2021   ALBUMIN 2.1 (L) 06/01/2021   BILITOT 1.0 05/28/2021   ALKPHOS 500 (H) 05/28/2021   AST 36 05/28/2021   ALT 10 05/28/2021   ANIONGAP 6 06/04/2021    CBG (last 3)  No results for input(s): GLUCAP in the last 72 hours.   GFR: Estimated Creatinine Clearance: 81.7 mL/min (by C-G formula based on SCr of 0.95 mg/dL).  Coagulation Profile: No results for input(s): INR, PROTIME in the last 168 hours.  Recent Results (from the past 240 hour(s))  Culture, blood (Routine X 2) w Reflex to ID Panel     Status: None   Collection Time: 05/26/21  2:18 PM   Specimen: BLOOD  Result Value Ref Range Status   Specimen Description   Final    BLOOD RIGHT ANTECUBITAL Performed at [facility] [address] [place], [place] [address]    Special Requests   Final    BOTTLES DRAWN AEROBIC AND ANAEROBIC Blood Culture results may not be optimal due to an excessive volume of blood received in culture bottles Performed at [facility] [address] [place], [place] [address]    Culture   Final    NO GROWTH 5 DAYS Performed at [facility], [place] [address], [place], [place] [address]    Report Status 05/31/2021 FINAL  Final  Culture, blood (Routine X 2) w Reflex to ID Panel      Status: None   Collection Time: 05/26/21  2:23 PM   Specimen: BLOOD  Result Value Ref Range Status   Specimen Description   Final    BLOOD LEFT ANTECUBITAL Performed at [facility] [address], [place], [place] [address]    Special Requests   Final    BOTTLES DRAWN AEROBIC AND ANAEROBIC Blood Culture adequate volume Performed at [facility] [address], [place], [place] [address]    Culture   Final    NO GROWTH 5 DAYS Performed at [facility], [place] [address] [place], [place] [address]    Report Status 05/31/2021 FINAL  Final  Resp Panel by RT-PCR (Flu A&B, Covid) Nasopharyngeal Swab     Status: None   Collection Time: 05/26/21  3:30 PM   Specimen: Nasopharyngeal Swab; Nasopharyngeal(NP) swabs in vial transport medium  Result Value Ref Range Status   SARS Coronavirus 2 by RT PCR NEGATIVE NEGATIVE Final    Comment: (NOTE) SARS-CoV-2 target nucleic acids are NOT DETECTED.  The SARS-CoV-2  RNA is generally detectable in upper respiratory specimens during the acute phase of infection. The lowest concentration of SARS-CoV-2 viral copies this assay can detect is 138 copies/mL. A negative result does not preclude SARS-Cov-2 infection and should not be used as the sole basis for treatment or other patient management decisions. A negative result may occur with  improper specimen collection/handling, submission of specimen other than nasopharyngeal swab, presence of viral mutation(s) within the areas targeted by this assay, and inadequate number of viral copies(<138 copies/mL). A negative result must be combined with clinical observations, patient history, and epidemiological information. The expected result is Negative.  Fact Sheet for Patients:  EntrepreneurPulse.com.au  Fact Sheet for Healthcare Providers:  IncredibleEmployment.be  This test is no t yet approved or cleared by the [place] FDA and  has been authorized for detection and/or diagnosis of SARS-CoV-2 by FDA under an Emergency Use Authorization (EUA). This EUA will remain  in effect (meaning this test can be used) for the duration of the COVID-19 declaration under Section 564(b)(1) of the Act, 21 U.S.C.section 360bbb-3(b)(1), unless the authorization is terminated  or revoked sooner.       Influenza A by PCR NEGATIVE NEGATIVE Final   Influenza B by PCR NEGATIVE NEGATIVE Final    Comment: (NOTE) The Xpert Xpress SARS-CoV-2/FLU/RSV plus assay is intended as an aid in the diagnosis of influenza from Nasopharyngeal swab specimens and should not be used as a sole basis for treatment. Nasal washings and aspirates are unacceptable for Xpert Xpress SARS-CoV-2/FLU/RSV testing.  Fact Sheet for Patients: EntrepreneurPulse.com.au  Fact Sheet for Healthcare Providers: IncredibleEmployment.be  This test is not yet approved or cleared by the [place] FDA and has been authorized for detection and/or diagnosis of SARS-CoV-2 by FDA under an Emergency Use Authorization (EUA). This EUA will remain in effect (meaning this test can be used) for the duration of the COVID-19 declaration under Section 564(b)(1) of the Act, 21 U.S.C. section 360bbb-3(b)(1), unless the authorization is terminated or revoked.  Performed at [facility], [place] [address], [place], [place] [address]   Resp Panel by RT-PCR (Flu A&B, Covid) Nasopharyngeal Swab     Status: None   Collection Time: 06/01/21  9:42 AM   Specimen: Nasopharyngeal Swab; Nasopharyngeal(NP) swabs in vial transport medium  Result Value Ref Range Status   SARS Coronavirus 2 by RT PCR NEGATIVE NEGATIVE Final    Comment: (NOTE) SARS-CoV-2 target nucleic acids are NOT DETECTED.  The SARS-CoV-2 RNA is generally detectable in upper respiratory specimens during the acute phase of infection. The lowest concentration  of SARS-CoV-2 viral copies this assay can detect is 138 copies/mL. A negative result does not preclude SARS-Cov-2 infection and should not be used as the sole basis for treatment or other patient management decisions. A negative result may occur with  improper specimen collection/handling, submission of specimen other than nasopharyngeal swab, presence of viral mutation(s) within the areas targeted by this assay, and inadequate number of viral copies(<138 copies/mL). A negative result must be combined with clinical observations, patient history, and epidemiological information. The expected result is Negative.  Fact Sheet for Patients:  EntrepreneurPulse.com.au  Fact Sheet for Healthcare Providers:  IncredibleEmployment.be  This test is no t yet approved or cleared by the [place] FDA and  has been authorized for detection and/or diagnosis of SARS-CoV-2 by FDA under an Emergency Use Authorization (EUA). This EUA will remain  in effect (meaning this test can be used) for the duration of the  COVID-19 declaration under Section 564(b)(1) of the Act, 21 U.S.C.section 360bbb-3(b)(1), unless the authorization is terminated  or revoked sooner.       Influenza A by PCR NEGATIVE NEGATIVE Final   Influenza B by PCR NEGATIVE NEGATIVE Final    Comment: (NOTE) The Xpert Xpress SARS-CoV-2/FLU/RSV plus assay is intended as an aid in the diagnosis of influenza from Nasopharyngeal swab specimens and should not be used as a sole basis for treatment. Nasal washings and aspirates are unacceptable for Xpert Xpress SARS-CoV-2/FLU/RSV testing.  Fact Sheet for Patients: EntrepreneurPulse.com.au  Fact Sheet for Healthcare Providers: IncredibleEmployment.be  This test is not yet approved or cleared by the [place] FDA and has been authorized for detection and/or diagnosis of SARS-CoV-2 by FDA under an Emergency Use  Authorization (EUA). This EUA will remain in effect (meaning this test can be used) for the duration of the COVID-19 declaration under Section 564(b)(1) of the Act, 21 U.S.C. section 360bbb-3(b)(1), unless the authorization is terminated or revoked.  Performed at [facility], [place] [address] [place],  [address]         Radiology Studies: DG Abd Portable 1V  Result Date: 06/03/2021 CLINICAL DATA:  Abdominal distension EXAM: PORTABLE ABDOMEN - 1 VIEW COMPARISON:  Dec 13, 2020 FINDINGS: Diffuse gaseous distension bowel without frank dilation. Moderate rectal stool ball. IVC filter. Status post posterior fixation of the inferior lumbar spine. Heterogeneous mixed lytic and sclerotic appearance of the inferior RIGHT pubic ramus, multiple ribs, and the spine consistent with known osseous metastatic disease. There is an infiltrative sclerotic appearance of the proximal LEFT femoral shaft. Pelvic phleboliths. IMPRESSION: 1. Nonobstructive bowel gas pattern with moderate rectal stool ball. If persistent concern for obstruction, consider further evaluation with dedicated CT. 2. Infiltrative sclerotic appearance the proximal LEFT femoral shaft. Findings are concerning for additional site of osseous metastatic disease. This could be further evaluated with dedicated femur radiographs or bone scan. Electronically Signed   By: Valentino Saxon M.D.   On: 06/03/2021 15:43        Scheduled Meds:  (feeding supplement) PROSource Plus  30 mL Oral BID BM   amLODipine  7.5 mg Oral Daily   apixaban  5 mg Oral BID   feeding supplement  237 mL Oral BID BM   magic mouthwash w/lidocaine  5 mL Oral QID   multivitamin with minerals  1 tablet Oral Daily   polyethylene glycol  17 g Oral Daily   sucralfate  1 g Oral TID WC & HS   Continuous Infusions:   LOS: 10 days     Cordelia Poche, MD [facility] 06/05/2021, 1:13 PM  If 7PM-7AM, please contact  night-coverage [email or web address]

## 2021-06-05 NOTE — Progress Notes (Signed)
Physical Therapy Treatment Patient Details Name: Terry Mitchell MRN: [id number] DOB: 31-Aug-1950 Today's Date: 06/05/2021   History of Present Illness Cort Jaxiel Kines is a 70 y.o. male with medical history significant of metastatic prostate cancer with mets to the bones.  He has cervical myelopathy metastatic prostate cancer with mets to the spine status post chemo and radiation   And he is a DNR.  Daughter reports. he is having difficulty swallowing food or drinking liquids.   He has a history of paraplegia and he is unable to walk.Pt.was admitted to CIR at [facility] on 05/06/2021 and discharged first week of November. Patient admitted with community-acquired pneumonia/hospital-acquired pneumonia    PT Comments    General Comments: AxO x 3 but flat affect.  Following all directions.  Expressing needs.  Quiet. Assisted OOB via [employer].  General bed mobility comments: pt required Max Assist with side to side rolling limited by back pain and no active assist B LE then only <25% B UE's.  Pt present with hip and knee flex tightness.General transfer comment: used Maxi Move Lift this session to assist to [facility] for a BM then to recliner.Extended time seated on BSC for MAX BM.  Then assisted to recliner via [employer].  Used 8 pillows to position to comfort.  Assisted with tray set up (lunch). Pt plans to D/C to SNF.   Recommendations for follow up therapy are one component of a multi-disciplinary discharge planning process, led by the attending physician.  Recommendations may be updated based on patient status, additional functional criteria and insurance authorization.  Follow Up Recommendations  Skilled nursing-short term rehab (<3 hours/day)     Assistance Recommended at Discharge Frequent or constant Supervision/Assistance  Equipment Recommendations  None recommended by PT    Recommendations for Other Services       Precautions / Restrictions Precautions Precautions: Fall Precaution Comments:  Paraplegia METS disease to spine     Mobility  Bed Mobility   Bed Mobility: Rolling Rolling: Max assist     Sit to supine: Max assist;+2 for physical assistance   General bed mobility comments: pt required Max Assist with side to side rolling limited by back pain and no active assist B LE then only <25% B UE's.  Pt present with hip and knee flex tightness.    Transfers Overall transfer level: Needs assistance   Transfers: Bed to chair/wheelchair/BSC             General transfer comment: used Maxi Move Lift this session to assist to  [facility] for a BM then to recliner. Transfer via Lift Equipment: Maximove  Ambulation/Gait               General Gait Details: non amb   Marine scientist Rankin (Stroke Patients Only)       Balance                                            Cognition Arousal/Alertness: Awake/alert   Overall Cognitive Status: Within Functional Limits for tasks assessed                                 General Comments: AxO x 3 but flat affect.  Following all  directions.  Expressing needs.  Quiet.        Exercises      General Comments        Pertinent Vitals/Pain Pain Assessment: Faces Faces Pain Scale: Hurts little more Pain Location: back and buttocks Pain Descriptors / Indicators: Discomfort;Grimacing;Guarding Pain Intervention(s): Monitored during session;Premedicated before session;Repositioned    Home Living                          Prior Function            PT Goals (current goals can now be found in the care plan section) Progress towards PT goals: Progressing toward goals    Frequency    Min 2X/week      PT Plan Current plan remains appropriate    Co-evaluation              AM-PAC PT "6 Clicks" Mobility   Outcome Measure  Help needed turning from your back to your side while in a flat bed without using bedrails?:  Total Help needed moving from lying on your back to sitting on the side of a flat bed without using bedrails?: Total Help needed moving to and from a bed to a chair (including a wheelchair)?: Total Help needed standing up from a chair using your arms (e.g., wheelchair or bedside chair)?: Total Help needed to walk in hospital room?: Total Help needed climbing 3-5 steps with a railing? : Total 6 Click Score: 6    End of Session Equipment Utilized During Treatment: Gait belt Activity Tolerance: Patient limited by fatigue;Patient limited by pain Patient left: in chair;with call bell/phone within reach Nurse Communication: Mobility status;Need for lift equipment PT Visit Diagnosis: Unsteadiness on feet (R26.81);Adult, failure to thrive (R62.7)     Time: 1121-1203 PT Time Calculation (min) (ACUTE ONLY): 42 min  Charges:  $Therapeutic Activity: 38-52 mins                     {[place]  PTA Acute  Rehabilitation Services Pager      [phone] Office      [phone]

## 2021-06-05 NOTE — Progress Notes (Signed)
Chaplain engaged in a follow-up visit with Terry Mitchell regarding POA.  Terry Mitchell relayed that his daughter Terry Mitchell is taking care of things.  Chaplain checked in by phone with Terry Mitchell to see what support she can offer.  Terry Mitchell also expressed that they were taking care of things and that there is no need for the paperwork at this time.  Chaplain checked in with Terry Mitchell who voiced that his legs were hurting and in pain.  A nurse was able to get a pillow for his legs and would inform Terry Mitchell's nurse of his need for pain medication.  Chaplain offered presence and support.  Chaplain will follow-up.    06/05/21 1000  Clinical Encounter Type  Visited With Patient and family together  Visit Type Follow-up;Social support

## 2021-06-05 NOTE — TOC Progression Note (Addendum)
Transition of Care [facility]) - Progression Note    Patient Details  Name: Terry Mitchell MRN: [id number] Date of Birth: January 29, 1951  [facility] CM/SW Contact  Ross Ludwig, [place] Phone Number: 06/05/2021, 5:04 PM  Clinical Narrative:     CSW contacted [place], [facility], and [facility] to discuss if they would be able to accept patient.  [facility] will review case with DON to see if they can accept patient.  [facility] and [place] both said they can not accept patient.  CSW also spoke to [place] at [facility] regarding if patient would be able to go to [facility], since they are a sister facility, and per [facility]ib[facility] Billings the SNF would do a background check and then deny patient.  CSW also called [facility] [place] [facility] of facilities to see if she had any ideas on a SNF that could accept patient, had to leave a message awaiting for a response back, [facility] still reviewing patient as well.  CSW to continue to follow patient's progress throughout discharge planning.   Expected Discharge Plan: Skilled Nursing Facility Barriers to Discharge: Other (must enter comment) (h/o criminal charges preventing accepted to SNF)  Expected Discharge Plan and Services Expected Discharge Plan: [place] In-house Referral: Clinical Social Work   Post Acute Care Choice: [place] arrangements for the past 2 months: Single Family Home Expected Discharge Date: 06/01/21                                     Social Determinants of Health (SDOH) Interventions    Readmission Risk Interventions No flowsheet data found.

## 2021-06-05 NOTE — Unmapped (Signed)
[facility] attempted to contact Kevin Cohen for clinical assessment of abiraterone.  We were unable to LVM at [phone] due to VM Box was full.  Contacted daughter, Kevin Cohen at [phone].  She reported Kevin Cohen is currently inpatient at [facility]).  She is unsure if he needs a refill for abiraterone.  She was given the phone number for [facility] and advised to call us once patient is discharged or if a refill is needed.      [facility] will follow up with patient in a couple of weeks or post discharge.    No delivery is scheduled at this time.    Horace Porteous, PharmD  [facility]

## 2021-06-06 DIAGNOSIS — E876 Hypokalemia: Secondary | ICD-10-CM | POA: Diagnosis not present

## 2021-06-06 DIAGNOSIS — J189 Pneumonia, unspecified organism: Secondary | ICD-10-CM | POA: Diagnosis not present

## 2021-06-06 DIAGNOSIS — G8220 Paraplegia, unspecified: Secondary | ICD-10-CM | POA: Diagnosis not present

## 2021-06-06 MED ORDER — FUROSEMIDE 20 MG PO TABS
20.0000 mg | ORAL_TABLET | Freq: Every day | ORAL | Status: DC
  Administered 2021-06-06 – 2021-06-09 (×4): 20 mg via ORAL
  Filled 2021-06-06 (×3): qty 1

## 2021-06-06 NOTE — TOC Progression Note (Signed)
Transition of Care [facility]) - Progression Note    Patient Details  Name: Tyshun Tuckerman MRN: [id number] Date of Birth: 1950/07/26  [facility]) CM/SW Contact  Ross Ludwig, [place] Phone Number: 06/06/2021, 10:54 AM  Clinical Narrative:     CSW spoke to [facility] for [facility] SNFs to see if she had any suggestions for facility that would be willing to accept patient.  CSW provided background information on patient and she said based on his background she is not aware of any SNFs that may be willing to accept patient.  CSW to continue to follow patient's progress throughout discharge planning.    Expected Discharge Plan: Skilled Nursing Facility Barriers to Discharge: Other (must enter comment) (h/o criminal charges preventing accepted to SNF)  Expected Discharge Plan and Services Expected Discharge Plan: [place] In-house Referral: Clinical Social Work   Post Acute Care Choice: [place] arrangements for the past 2 months: Single Family Home Expected Discharge Date: 06/01/21                                     Social Determinants of Health (SDOH) Interventions    Readmission Risk Interventions No flowsheet data found.

## 2021-06-06 NOTE — Progress Notes (Signed)
PROGRESS NOTE    Terry Mitchell  WUX:[id number] DOB: 1951-05-05 DOA: 05/26/2021 PCP: [name], MD   Brief Narrative: Terry Mitchell is a 70 y.o. male with medical history significant of metastatic prostate cancer with mets to the bones.  He had finished his radiation therapy approximately a week ago. Patient presented secondary to generalized weakness and decreased oral intake. On admission, he was found to have evidence of pneumonia.   Assessment & Plan:   Active Problems:   Prostate cancer metastatic to bone ([place])   Paraplegia (HCC)   Thrombocytopenia (HCC)   CAP (community acquired pneumonia)   Elevated serum creatinine   Community acquired pneumonia Chest x-ray consistent with right sided infiltrate concerning for pneumonia. Started empirically on Levaquin. Completed course.  AKI Baseline creatinine of about 1.2-1.3. Creatinine of 3.27 on admission. Resolved with IV fluids.  Hypokalemia -Replete as needed  Hypercalcemia In setting of malignancy. Resolved with IV fluids.  Pancytopenia Transient leukopenia. Now resolved.  Acute on chronic anemia Baseline hemoglobin of about 10-11. Hemoglobin of 7.5 on admission with decrease to 6.4 after IV fluids. Patient received 1 unit of PRBC  on 11/4 and is now stable.  Thrombocytopenia Chronic and stable.  Elevated alkaline phosphatase Secondary to bony metastatic disease.  Metastatic prostate cancer Spine metastasis Patient follows at [facility]. History of chemo/radiation. Previously on hospice, now revoked. Palliative care consulted this admission. -Palliative care recommendations: outpatient palliative care  Dysphagia Presumed secondary to radiation induced esophagitis. -Continue Magic mouthwash -Dysphagia 2 diet  Paraplegia Secondary to metastatic disease to spine. PT recommending SNF.  Left peroneal/popliteal DVT History of IVC filter placement at [facility]. On Eliquis -Continue  Eliquis  Peripheral edema Left arm likely secondary to IV. Also with bilateral LE edema. No findings concerning for DVT. Already on Eliquis. Albumin of 2.3 -Lasix 20 mg PO daily; increase as needed and watch BMP while titrating  Abdominal distension/pain Fecal impaction Possibly constipation related. Abdominal x-ray significant for moderate rectal stool ball. Given a dulcolax suppository. Bowel movements. Now with improved distension. -Miralax daily   DVT prophylaxis: Eliquis Code Status:   Code Status: DNR Family Communication: None at bedside Disposition Plan: Discharge to SNF if able to find a bed. If unable, may need to discharge home.   Consultants:  Palliative care  Procedures:  BLOOD TRANSFUSION; 1 UNIT (11/4)  Antimicrobials: Levaquin    Subjective: No issues this morning  Objective: Vitals:   06/05/21 0530 06/05/21 1349 06/05/21 2045 06/06/21 0634  BP: 128/74 119/61 138/71 117/79  Pulse: 97 100 (!) 102 (!) 107  Resp: 17 18 18 18   Temp: 98.6 F (37 C) 97.8 F (36.6 C) 98.9 F (37.2 C) 98.6 F (37 C)  TempSrc: Oral Oral Oral Oral  SpO2: 95% 95% 94% 93%  Weight:      Height:        Intake/Output Summary (Last 24 hours) at 06/06/2021 0942 Last data filed at 06/06/2021 0600 Gross per 24 hour  Intake 840 ml  Output 1100 ml  Net -260 ml    Filed Weights   05/26/21 1349  Weight: 89 kg    Examination:  General exam: Appears calm and comfortable Respiratory system: Clear to auscultation. Respiratory effort normal. Cardiovascular system: S1 & S2 heard, RRR. No murmurs, rubs, gallops or clicks. Gastrointestinal system: Abdomen is nondistended, soft and nontender. Normal bowel sounds heard. Central nervous system: Alert and oriented. Musculoskeletal: 2+ BLE and LUE edema. No calf tenderness Skin: No cyanosis.  No rashes Psychiatry: Judgement and insight appear normal.   Data Reviewed: I have personally reviewed following labs and imaging  studies  CBC Lab Results  Component Value Date   WBC 4.1 05/31/2021   RBC 2.72 (L) 05/31/2021   HGB 8.4 (L) 05/31/2021   HCT 27.3 (L) 05/31/2021   MCV 100.4 (H) 05/31/2021   MCH 30.9 05/31/2021   PLT 107 (L) 05/31/2021   MCHC 30.8 05/31/2021   RDW 18.6 (H) 05/31/2021   LYMPHSABS 0.3 (L) 05/26/2021   MONOABS 0.4 05/26/2021   EOSABS 0.0 05/26/2021   BASOSABS 0.0 03/54/6568     Last metabolic panel Lab Results  Component Value Date   NA 140 06/04/2021   K 3.6 06/04/2021   CL 107 06/04/2021   CO2 27 06/04/2021   BUN 21 06/04/2021   CREATININE 0.95 06/04/2021   GLUCOSE 101 (H) 06/04/2021   GFRNONAA >60 06/04/2021   GFRAA 50 (L) 04/06/2020   CALCIUM 8.8 (L) 06/04/2021   PHOS 2.3 (L) 06/01/2021   PROT 4.8 (L) 05/28/2021   ALBUMIN 2.1 (L) 06/01/2021   BILITOT 1.0 05/28/2021   ALKPHOS 500 (H) 05/28/2021   AST 36 05/28/2021   ALT 10 05/28/2021   ANIONGAP 6 06/04/2021    CBG (last 3)  No results for input(s): GLUCAP in the last 72 hours.   GFR: Estimated Creatinine Clearance: 81.7 mL/min (by C-G formula based on SCr of 0.95 mg/dL).  Coagulation Profile: No results for input(s): INR, PROTIME in the last 168 hours.  Recent Results (from the past 240 hour(s))  Resp Panel by RT-PCR (Flu A&B, Covid) Nasopharyngeal Swab     Status: None   Collection Time: 06/01/21  9:42 AM   Specimen: Nasopharyngeal Swab; Nasopharyngeal(NP) swabs in vial transport medium  Result Value Ref Range Status   SARS Coronavirus 2 by RT PCR NEGATIVE NEGATIVE Final    Comment: (NOTE) SARS-CoV-2 target nucleic acids are NOT DETECTED.  The SARS-CoV-2 RNA is generally detectable in upper respiratory specimens during the acute phase of infection. The lowest concentration of SARS-CoV-2 viral copies this assay can detect is 138 copies/mL. A negative result does not preclude SARS-Cov-2 infection and should not be used as the sole basis for treatment or other patient management decisions. A negative  result may occur with  improper specimen collection/handling, submission of specimen other than nasopharyngeal swab, presence of viral mutation(s) within the areas targeted by this assay, and inadequate number of viral copies(<138 copies/mL). A negative result must be combined with clinical observations, patient history, and epidemiological information. The expected result is Negative.  Fact Sheet for Patients:  EntrepreneurPulse.com.au  Fact Sheet for Healthcare Providers:  IncredibleEmployment.be  This test is no t yet approved or cleared by the [place] FDA and  has been authorized for detection and/or diagnosis of SARS-CoV-2 by FDA under an Emergency Use Authorization (EUA). This EUA will remain  in effect (meaning this test can be used) for the duration of the COVID-19 declaration under Section 564(b)(1) of the Act, 21 U.S.C.section 360bbb-3(b)(1), unless the authorization is terminated  or revoked sooner.       Influenza A by PCR NEGATIVE NEGATIVE Final   Influenza B by PCR NEGATIVE NEGATIVE Final    Comment: (NOTE) The Xpert Xpress SARS-CoV-2/FLU/RSV plus assay is intended as an aid in the diagnosis of influenza from Nasopharyngeal swab specimens and should not be used as a sole basis for treatment. Nasal washings and aspirates are unacceptable for Xpert Xpress SARS-CoV-2/FLU/RSV testing.  Fact  Sheet for Patients: EntrepreneurPulse.com.au  Fact Sheet for Healthcare Providers: IncredibleEmployment.be  This test is not yet approved or cleared by the [place] FDA and has been authorized for detection and/or diagnosis of SARS-CoV-2 by FDA under an Emergency Use Authorization (EUA). This EUA will remain in effect (meaning this test can be used) for the duration of the COVID-19 declaration under Section 564(b)(1) of the Act, 21 U.S.C. section 360bbb-3(b)(1), unless the authorization is  terminated or revoked.  Performed at [facility], [place] [address], [place], [place] [address]         Radiology Studies: No results found.      Scheduled Meds:  (feeding supplement) PROSource Plus  30 mL Oral BID BM   amLODipine  7.5 mg Oral Daily   apixaban  5 mg Oral BID   feeding supplement  237 mL Oral BID BM   magic mouthwash w/lidocaine  5 mL Oral QID   multivitamin with minerals  1 tablet Oral Daily   polyethylene glycol  17 g Oral Daily   sucralfate  1 g Oral TID WC & HS   Continuous Infusions:   LOS: 11 days     Cordelia Poche, MD [facility] 06/06/2021, 9:42 AM  If 7PM-7AM, please contact night-coverage [email or web address]

## 2021-06-07 ENCOUNTER — Inpatient Hospital Stay (HOSPITAL_COMMUNITY): Payer: Medicare HMO

## 2021-06-07 DIAGNOSIS — C61 Malignant neoplasm of prostate: Secondary | ICD-10-CM | POA: Diagnosis not present

## 2021-06-07 DIAGNOSIS — C7951 Secondary malignant neoplasm of bone: Secondary | ICD-10-CM | POA: Diagnosis not present

## 2021-06-07 DIAGNOSIS — D61818 Other pancytopenia: Secondary | ICD-10-CM

## 2021-06-07 DIAGNOSIS — I82409 Acute embolism and thrombosis of unspecified deep veins of unspecified lower extremity: Secondary | ICD-10-CM

## 2021-06-07 DIAGNOSIS — R140 Abdominal distension (gaseous): Secondary | ICD-10-CM

## 2021-06-07 DIAGNOSIS — E876 Hypokalemia: Secondary | ICD-10-CM

## 2021-06-07 DIAGNOSIS — N179 Acute kidney failure, unspecified: Secondary | ICD-10-CM

## 2021-06-07 DIAGNOSIS — R609 Edema, unspecified: Secondary | ICD-10-CM

## 2021-06-07 DIAGNOSIS — R1310 Dysphagia, unspecified: Secondary | ICD-10-CM

## 2021-06-07 DIAGNOSIS — R748 Abnormal levels of other serum enzymes: Secondary | ICD-10-CM

## 2021-06-07 LAB — BASIC METABOLIC PANEL
Anion gap: 8 (ref 5–15)
BUN: 18 mg/dL (ref 8–23)
CO2: 25 mmol/L (ref 22–32)
Calcium: 9.4 mg/dL (ref 8.9–10.3)
Chloride: 105 mmol/L (ref 98–111)
Creatinine, Ser: 0.92 mg/dL (ref 0.61–1.24)
GFR, Estimated: >60 mL/min (ref >60)
Glucose, Bld: 92 mg/dL (ref 70–99)
Potassium: 3.5 mmol/L (ref 3.5–5.1)
Sodium: 138 mmol/L (ref 135–145)

## 2021-06-07 LAB — CBC
HCT: 23.3 % — ABNORMAL LOW (ref 39.0–52.0)
Hemoglobin: 7.3 g/dL — ABNORMAL LOW (ref 13.0–17.0)
MCH: 30.7 pg (ref 26.0–34.0)
MCHC: 31.3 g/dL (ref 30.0–36.0)
MCV: 97.9 fL (ref 80.0–100.0)
Platelets: 175 10*3/uL (ref 150–400)
RBC: 2.38 MIL/uL — ABNORMAL LOW (ref 4.22–5.81)
RDW: 17.4 % — ABNORMAL HIGH (ref 11.5–15.5)
WBC: 5.3 10*3/uL (ref 4.0–10.5)
nRBC: 0 % (ref 0.0–0.2)

## 2021-06-07 NOTE — Assessment & Plan Note (Signed)
follow

## 2021-06-07 NOTE — Assessment & Plan Note (Addendum)
Presumed radiation induced esophagitis Regular diet PPI, sucralfate

## 2021-06-07 NOTE — Progress Notes (Signed)
Left upper extremity venous duplex has been completed. Preliminary results can be found in CV Proc through chart review.   06/07/21 10:53 AM Carlos Levering RVT

## 2021-06-07 NOTE — Assessment & Plan Note (Signed)
Completed course of levaquin Stable on room air

## 2021-06-07 NOTE — Progress Notes (Signed)
Speech Language Pathology Treatment: Dysphagia  Patient Details Name: Terry Mitchell MRN: [id number] DOB: August 01, 1950 Today's Date: 06/07/2021 Time: 1630-1650 SLP Time Calculation (min) (ACUTE ONLY): 20 min  Assessment / Plan / Recommendation Clinical Impression  Dysphagia goals addressed today. Pt sleeping but woke to SLP verbal cues and willing to consume ice water - but declined to consume any solids.  Pt and RN deny pt having issues with coughing during intake. Pt's voice remains hoarse with decreased strength *uncertain of source* but he appears to be managing intake adequately.   SLP observed pt consuming thin water via straw and no indication of aspiration noted.  Pt's swallow appeared timely without indication of retention. Suspect improved positioning and medical status improvement helpful. Pt denies to try any solids and states his current diet is "fine". Very poor intake of lunch tray at bedside noted - and pt's displeasure with some of his pureed foods on minced diet - may further inhibit po.  Will advance to dys3/thin - Pt declined to conduct RMST with SLP today - nor did he desire to use his IMT or flutter valve.   Will follow and thankful pt has made progress in his swallowing.   HPI HPI: Terry Mitchell is a 70 y.o. male history of hypertension, prostate cancer with spinal, rib, and other bony metastasis, present emergency department with chest pain and arm pain.  CXR remarkable for opacity in the R lower lobe.  MRI total spine from 04/05/21 shows diffuse metastatic disease with epidural thickening and spinal canal stenosis from C3-5 without cord signal change, and left foraminal invasion at C5-6.  Pt completed inpatient rehab with PT & OT (no ST) at [facility] from 10/15-11/01.      SLP Plan  Continue with current plan of care      Recommendations for follow up therapy are one component of a multi-disciplinary discharge planning process, led by the attending physician.  Recommendations  may be updated based on patient status, additional functional criteria and insurance authorization.    Recommendations  Diet recommendations: Dysphagia 3 (mechanical soft);Thin liquid Liquids provided via: Cup;Straw Medication Administration: Whole meds with liquid (or with puree if problematic) Supervision: Patient able to self feed Compensations: Slow rate;Small sips/bites Postural Changes and/or Swallow Maneuvers: Seated upright 90 degrees                Oral Care Recommendations: Oral care BID Follow Up Recommendations: No SLP follow up Assistance recommended at discharge:  (none for po) SLP Visit Diagnosis: Dysphagia, unspecified (R13.10) Plan: Continue with current plan of care       GO               Kathleen Lime, MS [place] Office [phone] Pager [phone]  Macario Golds  06/07/2021, 6:13 PM

## 2021-06-07 NOTE — Assessment & Plan Note (Addendum)
Due to fecal impaction, now improved Follow with bowel regimen, suppository prn  Plain film with infiltrative sclerotic appearance of proximal L femoral shaft, suspect metastatic disease

## 2021-06-07 NOTE — Assessment & Plan Note (Signed)
Resolved

## 2021-06-07 NOTE — Progress Notes (Signed)
PROGRESS NOTE    Terry Mitchell  ZDG:[id number] DOB: 07-03-1951 DOA: 05/26/2021 PCP: [name], MD    No chief complaint on file.   Brief Narrative Terry Mitchell is Terry Mitchell 70 y.o. male with medical history significant of metastatic prostate cancer with mets to the bones.  He had finished his radiation therapy approximately Danialle Dement week ago. Patient presented secondary to generalized weakness and decreased oral intake. On admission, he was found to have evidence of pneumonia.  Assessment & Plan:   Principal Problem:   Prostate cancer metastatic to bone Terry Mitchell) Active Problems:   Paraplegia (Terry Mitchell)   CAP (community acquired pneumonia)   AKI (acute kidney injury) (Terry Mitchell)   Hypercalcemia   Hypokalemia   Anemia   Pancytopenia (Terry Mitchell)   Elevated alkaline phosphatase level   Dysphagia   DVT (deep venous thrombosis) (Terry Mitchell)   Edema   Abdominal distension   Elevated serum creatinine  Prostate cancer metastatic to bone [facility]) Follows with [facility] oncology and neurosurgery May need to reach out to ensure on same page with medications (vs discussing with our oncologists)   Of note, previously had been enrolled in hospice, now revoked  CAP (community acquired pneumonia) Completed course of levaquin Stable on room air  Paraplegia [facility]) Seen by neurosurgery in 04/2021 hospital visit.  At that time, sx concerning for cervical myelopathy.  Not surgical candidate, steroid taper, radiation therapy and inpatient rehab.  Discharged to inpatient rehab 05/06/2021.    Unsafe discharge at this point as patient's daughter disabled, unable to care for him at home herself.   AKI (acute kidney injury) ([place]) Resolved   Hypercalcemia Resolved with IVF  Hypokalemia Replace and follow   Anemia Anemia downtrending today, other counts ok Will trend  Pancytopenia ([place]) follow  Elevated alkaline phosphatase level 2/2 metastatic disease  Dysphagia Presumed radiation induced esophagitis Dysphagia 2  diet PPI   DVT (deep venous thrombosis) (Terry Mitchell) Left peroneal, popliteal DVT LUE [place] with UE edema, follow Continue eliquis   Edema Follow on oral lasix, most notable in LUE today  Abdominal distension Due to fecal impaction, now improved Follow with bowel regimen    DVT prophylaxis: eliquis Code Status: DNR Family Communication: daughter Disposition:   Status is: Inpatient  Remains inpatient appropriate because: unsafe discharge plan       Consultants:  none  Procedures:  LE [place] Summary:     Right:  No evidence of thrombosis in the subclavian.     Left:  No evidence of deep vein thrombosis in the upper extremity. No evidence of  superficial vein thrombosis in the upper extremity.   Antimicrobials:  Anti-infectives (From admission, onward)    Start     Dose/Rate Route Frequency Ordered Stop   05/29/21 1145  levofloxacin (LEVAQUIN) IVPB 750 mg        750 mg 100 mL/hr over 90 Minutes Intravenous Every 24 hours 05/29/21 1134 05/31/21 1430   05/28/21 1000  levofloxacin (LEVAQUIN) IVPB 750 mg  Status:  Discontinued        750 mg 100 mL/hr over 90 Minutes Intravenous Every 48 hours 05/26/21 1624 05/29/21 1134   05/26/21 1730  levofloxacin (LEVAQUIN) IVPB 750 mg  Status:  Discontinued        750 mg 100 mL/hr over 90 Minutes Intravenous Every 24 hours 05/26/21 1716 05/26/21 1719   05/26/21 1545  levofloxacin (LEVAQUIN) IVPB 750 mg        750 mg 100 mL/hr over 90 Minutes Intravenous  Once  05/26/21 1533 05/26/21 1711          Subjective: No complaints at this time  Objective: Vitals:   06/06/21 1322 06/06/21 2047 06/07/21 0615 06/07/21 1310  BP: 134/72 128/66 116/60 (!) 127/92  Pulse: 92 100 91 92  Resp: 14 18 18 16   Temp: 98.8 F (37.1 C) 98 F (36.7 C) 98.3 F (36.8 C) 98.4 F (36.9 C)  TempSrc: Oral Oral Oral Oral  SpO2: 96% 94% 94% 96%  Weight:      Height:        Intake/Output Summary (Last 24 hours) at 06/07/2021 1333 Last data filed at  06/07/2021 0600 Gross per 24 hour  Intake 680 ml  Output 1300 ml  Net -620 ml   Filed Weights   05/26/21 1349  Weight: 89 kg    Examination:  General: No acute distress. Cardiovascular: RRR Lungs: unlabored Abdomen: Soft, nontender, nondistended  Neurological: bilateral lower extremity > upper extremity weakness Skin: Warm and dry. No rashes or lesions.    Data Reviewed: I have personally reviewed following labs and imaging studies  CBC: Recent Labs  Lab 06/07/21 0419  WBC 5.3  HGB 7.3*  HCT 23.3*  MCV 97.9  PLT 540    Basic Metabolic Panel: Recent Labs  Lab 06/01/21 0424 06/02/21 1151 06/03/21 1307 06/04/21 0436 06/07/21 0419  NA 140  --   --  140 138  K 3.1* 2.9*  --  3.6 3.5  CL 108  --   --  107 105  CO2 24  --   --  27 25  GLUCOSE 100*  --   --  101* 92  BUN 20  --   --  21 18  CREATININE 1.01  --   --  0.95 0.92  CALCIUM 8.8*  --   --  8.8* 9.4  MG 1.7  --  1.9  --   --   PHOS 2.3*  --   --   --   --     GFR: Estimated Creatinine Clearance: 84.3 mL/min (by C-G formula based on SCr of 0.92 mg/dL).  Liver Function Tests: Recent Labs  Lab 06/01/21 0424  ALBUMIN 2.1*    CBG: No results for input(s): GLUCAP in the last 168 hours.   Recent Results (from the past 240 hour(s))  Resp Panel by RT-PCR (Flu Nel Stoneking&B, Covid) Nasopharyngeal Swab     Status: None   Collection Time: 06/01/21  9:42 AM   Specimen: Nasopharyngeal Swab; Nasopharyngeal(NP) swabs in vial transport medium  Result Value Ref Range Status   SARS Coronavirus 2 by RT PCR NEGATIVE NEGATIVE Final    Comment: (NOTE) SARS-CoV-2 target nucleic acids are NOT DETECTED.  The SARS-CoV-2 RNA is generally detectable in upper respiratory specimens during the acute phase of infection. The lowest concentration of SARS-CoV-2 viral copies this assay can detect is 138 copies/mL. Brixton Franko negative result does not preclude SARS-Cov-2 infection and should not be used as the sole basis for treatment  or other patient management decisions. Yoana Staib negative result may occur with  improper specimen collection/handling, submission of specimen other than nasopharyngeal swab, presence of viral mutation(s) within the areas targeted by this assay, and inadequate number of viral copies(<138 copies/mL). Lonnetta Kniskern negative result must be combined with clinical observations, patient history, and epidemiological information. The expected result is Negative.  Fact Sheet for Patients:  EntrepreneurPulse.com.au  Fact Sheet for Healthcare Providers:  IncredibleEmployment.be  This test is no t yet approved or cleared by the [place]  States FDA and  has been authorized for detection and/or diagnosis of SARS-CoV-2 by FDA under an Emergency Use Authorization (EUA). This EUA will remain  in effect (meaning this test can be used) for the duration of the COVID-19 declaration under Section 564(b)(1) of the Act, 21 U.S.C.section 360bbb-3(b)(1), unless the authorization is terminated  or revoked sooner.       Influenza Ryot Burrous by PCR NEGATIVE NEGATIVE Final   Influenza B by PCR NEGATIVE NEGATIVE Final    Comment: (NOTE) The Xpert Xpress SARS-CoV-2/FLU/RSV plus assay is intended as an aid in the diagnosis of influenza from Nasopharyngeal swab specimens and should not be used as Ellan Tess sole basis for treatment. Nasal washings and aspirates are unacceptable for Xpert Xpress SARS-CoV-2/FLU/RSV testing.  Fact Sheet for Patients: EntrepreneurPulse.com.au  Fact Sheet for Healthcare Providers: IncredibleEmployment.be  This test is not yet approved or cleared by the [place] FDA and has been authorized for detection and/or diagnosis of SARS-CoV-2 by FDA under an Emergency Use Authorization (EUA). This EUA will remain in effect (meaning this test can be used) for the duration of the COVID-19 declaration under Section 564(b)(1) of the Act, 21 U.S.C. section  360bbb-3(b)(1), unless the authorization is terminated or revoked.  Performed at [facility], [place] [address], [place], [place] [address]          Radiology Studies: VAS [place] UPPER EXTREMITY VENOUS DUPLEX  Result Date: 06/07/2021 UPPER VENOUS STUDY  Patient Name:  EISSA BUCHBERGER  Date of Exam:   06/07/2021 Medical Rec #: [id number]         Accession #:    [id number] Date of Birth: [birth year]         Patient Gender: M Patient Age:   13 years Exam Location:  [facility] Procedure:      VAS [place] UPPER EXTREMITY VENOUS DUPLEX Referring Phys: Day Greb POWELL JR --------------------------------------------------------------------------------  Indications: Edema Risk Factors: None identified. Comparison Study: No prior studies. Performing Technologist: Oliver Hum RVT  Examination Guidelines: Jayquan Bradsher complete evaluation includes B-mode imaging, spectral Doppler, color Doppler, and power Doppler as needed of all accessible portions of each vessel. Bilateral testing is considered an integral part of Obie Silos complete examination. Limited examinations for reoccurring indications may be performed as noted.  Right Findings: +----------+------------+---------+-----------+----------+-------+ RIGHT     CompressiblePhasicitySpontaneousPropertiesSummary +----------+------------+---------+-----------+----------+-------+ Subclavian    Full       Yes       Yes                      +----------+------------+---------+-----------+----------+-------+  Left Findings: +----------+------------+---------+-----------+----------+-------+ LEFT      CompressiblePhasicitySpontaneousPropertiesSummary +----------+------------+---------+-----------+----------+-------+ IJV           Full       Yes       Yes                      +----------+------------+---------+-----------+----------+-------+ Subclavian    Full       Yes       Yes                       +----------+------------+---------+-----------+----------+-------+ Axillary      Full       Yes       Yes                      +----------+------------+---------+-----------+----------+-------+ Brachial      Full       Yes  Yes                      +----------+------------+---------+-----------+----------+-------+ Radial        Full                                          +----------+------------+---------+-----------+----------+-------+ Ulnar         Full                                          +----------+------------+---------+-----------+----------+-------+ Cephalic      Full                                          +----------+------------+---------+-----------+----------+-------+ Basilic       Full                                          +----------+------------+---------+-----------+----------+-------+  Summary:  Right: No evidence of thrombosis in the subclavian.  Left: No evidence of deep vein thrombosis in the upper extremity. No evidence of superficial vein thrombosis in the upper extremity.  *See table(s) above for measurements and observations.     Preliminary         Scheduled Meds:  (feeding supplement) PROSource Plus  30 mL Oral BID BM   amLODipine  7.5 mg Oral Daily   apixaban  5 mg Oral BID   feeding supplement  237 mL Oral BID BM   furosemide  20 mg Oral Daily   magic mouthwash w/lidocaine  5 mL Oral QID   multivitamin with minerals  1 tablet Oral Daily   polyethylene glycol  17 g Oral Daily   sucralfate  1 g Oral TID WC & HS   Continuous Infusions:   LOS: 12 days    Time spent: over 30 min    Fayrene Helper, MD [facility]   To contact the attending provider between 7A-7P or the covering provider during after hours 7P-7A, please log into the web site [email or web address] and access using universal [place] password for that web site. If you do not have the password, please call the hospital  operator.  06/07/2021, 1:33 PM

## 2021-06-07 NOTE — Assessment & Plan Note (Signed)
2/2 metastatic disease

## 2021-06-07 NOTE — Assessment & Plan Note (Signed)
Replace and follow. ?

## 2021-06-07 NOTE — Assessment & Plan Note (Signed)
Follow on oral lasix, most notable in LUE today

## 2021-06-07 NOTE — Assessment & Plan Note (Addendum)
Seen by neurosurgery in 04/2021 hospital visit.  At that time, sx concerning for cervical myelopathy.  Not surgical candidate, steroid taper, radiation therapy and inpatient rehab.  Discharged to inpatient rehab 05/06/2021.   I discussed his MRI C/T/L from 11/7 spine with nsgy on call, they recommended outpatient follow up with his oncologist and neurosurgeon outpatient.     Unsafe discharge at this point as patient's daughter disabled, unable to care for him at home herself.

## 2021-06-07 NOTE — Assessment & Plan Note (Addendum)
Anemia downtrending today, other counts ok Will trend Follow anemia labs - iron def, normal folate and elevated b12. Continue iron

## 2021-06-07 NOTE — Assessment & Plan Note (Addendum)
Follows with [facility] oncology and neurosurgery May need to reach out to ensure on same page with medications (vs discussing with our oncologists)  Discussed with Dr. Eliane Decree today, with poor overall clinical status, prognosis is poor.  He recommended hospice.  Recommended following up PSA (>1,500  ---> his last in care everywhere was 1694.29, so difficult to compare directly as our upper limit appears to have been 1500).  I've discussed hospice recommendation with patient and his daughter, I think they'll probably need to continue to discuss this, though they do seem open.  Will recommend palliative to follow outpatient at SNF as well as recommend they reach out and discuss with Dr. Eliane Decree with tele visit if possible to help with understanding given difficulty of arranging follow up in [place].   Of note, previously had been enrolled in hospice, now revoked

## 2021-06-07 NOTE — Assessment & Plan Note (Signed)
Resolved with IVF 

## 2021-06-07 NOTE — Assessment & Plan Note (Signed)
Left peroneal, popliteal DVT LUE [place] with UE edema, follow Continue eliquis

## 2021-06-08 ENCOUNTER — Ambulatory Visit: Admit: 2021-06-08 | Payer: MEDICARE

## 2021-06-08 ENCOUNTER — Ambulatory Visit: Admit: 2021-06-08 | Payer: MEDICARE | Attending: Hematology & Oncology | Primary: Hematology & Oncology

## 2021-06-08 DIAGNOSIS — R630 Anorexia: Secondary | ICD-10-CM

## 2021-06-08 LAB — COMPREHENSIVE METABOLIC PANEL
ALT: 9 U/L (ref 0–44)
AST: 28 U/L (ref 15–41)
Albumin: 2 g/dL — ABNORMAL LOW (ref 3.5–5.0)
Alkaline Phosphatase: 608 U/L — ABNORMAL HIGH (ref 38–126)
Anion gap: 9 (ref 5–15)
BUN: 17 mg/dL (ref 8–23)
CO2: 25 mmol/L (ref 22–32)
Calcium: 9.4 mg/dL (ref 8.9–10.3)
Chloride: 103 mmol/L (ref 98–111)
Creatinine, Ser: 0.91 mg/dL (ref 0.61–1.24)
GFR, Estimated: >60 mL/min (ref >60)
Glucose, Bld: 88 mg/dL (ref 70–99)
Potassium: 3.4 mmol/L — ABNORMAL LOW (ref 3.5–5.1)
Sodium: 137 mmol/L (ref 135–145)
Total Bilirubin: 0.8 mg/dL (ref 0.3–1.2)
Total Protein: 4.7 g/dL — ABNORMAL LOW (ref 6.5–8.1)

## 2021-06-08 LAB — CBC WITH DIFFERENTIAL/PLATELET
Abs Immature Granulocytes: 0.07 10*3/uL (ref 0.00–0.07)
Basophils Absolute: 0 10*3/uL (ref 0.0–0.1)
Basophils Relative: 0 %
Eosinophils Absolute: 0 10*3/uL (ref 0.0–0.5)
Eosinophils Relative: 0 %
HCT: 24.4 % — ABNORMAL LOW (ref 39.0–52.0)
Hemoglobin: 7.6 g/dL — ABNORMAL LOW (ref 13.0–17.0)
Immature Granulocytes: 1 %
Lymphocytes Relative: 8 %
Lymphs Abs: 0.4 10*3/uL — ABNORMAL LOW (ref 0.7–4.0)
MCH: 30.8 pg (ref 26.0–34.0)
MCHC: 31.1 g/dL (ref 30.0–36.0)
MCV: 98.8 fL (ref 80.0–100.0)
Monocytes Absolute: 0.5 10*3/uL (ref 0.1–1.0)
Monocytes Relative: 11 %
Neutro Abs: 4.1 10*3/uL (ref 1.7–7.7)
Neutrophils Relative %: 80 %
Platelets: 184 10*3/uL (ref 150–400)
RBC: 2.47 MIL/uL — ABNORMAL LOW (ref 4.22–5.81)
RDW: 17.5 % — ABNORMAL HIGH (ref 11.5–15.5)
WBC: 5.1 10*3/uL (ref 4.0–10.5)
nRBC: 0 % (ref 0.0–0.2)

## 2021-06-08 MED ORDER — POTASSIUM CHLORIDE CRYS ER 20 MEQ PO TBCR
40.0000 meq | EXTENDED_RELEASE_TABLET | Freq: Once | ORAL | Status: AC
  Administered 2021-06-08: 11:00:00 40 meq via ORAL
  Filled 2021-06-08: qty 2

## 2021-06-08 MED ORDER — BOOST PLUS PO LIQD
237.0000 mL | Freq: Three times a day (TID) | ORAL | Status: DC
  Filled 2021-06-08 (×4): qty 237

## 2021-06-08 NOTE — Unmapped (Signed)
Patient Kevin Cohen was called in attempt number 1 to reach them regarding rescheduling their upcoming appointments on 11/17.     The call resulted in: Voicemail full    Please schedule the patient upon their return call on next avail for the following:     Lab Appointment Dr Philomena Course and inj    Thank you,    Kandis Ban

## 2021-06-08 NOTE — Progress Notes (Addendum)
Physical Therapy Treatment Patient Details Name: Terry Mitchell MRN: [id number] DOB: Aug 06, 1950 Today's Date: 06/08/2021   History of Present Illness Hunter Adeel Guiffre is a 70 y.o. male with medical history significant of metastatic prostate cancer with mets to the bones.  He has cervical myelopathy metastatic prostate cancer with mets to the spine status post chemo and radiation   And he is a DNR.  Daughter reports. he is having difficulty swallowing food or drinking liquids.   He has a history of paraplegia and he is unable to walk.Pt.was admitted to CIR at [facility] on 05/06/2021 and discharged first week of November. Patient admitted with community-acquired pneumonia/hospital-acquired pneumonia    PT Comments    General Comments: AxO x 3 but flat affect.  Following all directions.  Expressing needs.  Quiet. Assisted OOB to recliner pt was able to perform stand pivot sit this session. General bed mobility comments: pt was able to transition from supine to EOB at [facility].  Increased self ability to iniate B LE mvmt.  Greatest fifficulty scooting. Poor sitting balance with left lean.  Once midline, pt was able to static sit EOB x 4 min while waiting for + 2 assist to transfer to recliner.General transfer comment: pt was able to partially rise from elevated bed and assistyed with 1/4 pivot to hir RIGHT to recliner via "Tripp".  Pt was able to support some of his weight and required increased assist to complete 90 degree turn.  Also required assist to control desend into chair.  Positioned with multiple pillows upright. Rec Maxi Move back to bed due to lower recliner height. Pt will need ST Rehab at SNF to increase his ability to reposition in bed and transfer.   Recommendations for follow up therapy are one component of a multi-disciplinary discharge planning process, led by the attending physician.  Recommendations may be updated based on patient status, additional functional criteria and insurance  authorization.  Follow Up Recommendations  Skilled nursing-short term rehab (<3 hours/day)     Assistance Recommended at Discharge Frequent or constant Supervision/Assistance  Equipment Recommendations  None recommended by PT    Recommendations for Other Services       Precautions / Restrictions Precautions Precautions: Fall Precaution Comments: Paraplegia METS disease to spine.  L more involved than R side Restrictions Weight Bearing Restrictions: No     Mobility  Bed Mobility Overal bed mobility: Needs Assistance Bed Mobility: Sidelying to Sit   Sidelying to sit: Max assist       General bed mobility comments: pt was able to transition from supine to EOB at [facility].  Increased self ability to iniate B LE mvmt.  Greatest fifficulty scooting. Poor sitting balance with left lean.  Once midline, pt was able to static sit EOB x 4 min while waiting for + 2 assist to transfer to recliner.    Transfers Overall transfer level: Needs assistance Equipment used: None Transfers: Bed to chair/wheelchair/BSC Sit to Stand: +2 physical assistance;Max assist Stand pivot transfers: +2 physical assistance;Max assist         General transfer comment: pt was able to partially rise from elevated bed and assistyed with 1/4 pivot to hir RIGHT to recliner via "Bear Hug".  Pt was able to support some of his weight and required increased assist to complete 90 degree turn.  Also required assist to control desend into chair.  Rec Maxi Move back to bed due to lower recliner height.    Ambulation/Gait  General Gait Details: transfers only at this time   Stairs             Wheelchair Mobility    Modified Rankin (Stroke Patients Only)       Balance                                            Cognition Arousal/Alertness: Awake/alert Behavior During Therapy: WFL for tasks assessed/performed Overall Cognitive Status: Within Functional  Limits for tasks assessed                                 General Comments: AxO x 3 but flat affect.  Following all directions.  Expressing needs.  Quiet.        Exercises      General Comments        Pertinent Vitals/Pain Pain Assessment: Faces Faces Pain Scale: Hurts little more Pain Location: back and buttocks Pain Descriptors / Indicators: Discomfort;Grimacing;Guarding Pain Intervention(s): Monitored during session;Repositioned    Home Living                          Prior Function            PT Goals (current goals can now be found in the care plan section) Progress towards PT goals: Progressing toward goals    Frequency    Min 2X/week      PT Plan Current plan remains appropriate    Co-evaluation              AM-PAC PT "6 Clicks" Mobility   Outcome Measure  Help needed turning from your back to your side while in a flat bed without using bedrails?: Total Help needed moving from lying on your back to sitting on the side of a flat bed without using bedrails?: Total Help needed moving to and from a bed to a chair (including a wheelchair)?: Total Help needed standing up from a chair using your arms (e.g., wheelchair or bedside chair)?: Total Help needed to walk in hospital room?: Total Help needed climbing 3-5 steps with a railing? : Total 6 Click Score: 6    End of Session Equipment Utilized During Treatment: Gait belt Activity Tolerance: Patient limited by fatigue Patient left: in chair;with call bell/phone within reach Nurse Communication: Mobility status;Need for lift equipment PT Visit Diagnosis: Unsteadiness on feet (R26.81);Adult, failure to thrive (R62.7)     Time: 0071-2197 PT Time Calculation (min) (ACUTE ONLY): 28 min  Charges:  $Therapeutic Activity: 23-37 mins                     {Oza Oberle  PTA Acute  Rehabilitation Services Pager      [phone] Office      [phone]
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
## 2021-06-08 NOTE — Assessment & Plan Note (Addendum)
Poor intake over past several meals, 0-25% Suspect related to radiation esophagitis and metastatic cancer Follow outpatient

## 2021-06-08 NOTE — Progress Notes (Addendum)
PROGRESS NOTE    Terry Mitchell  TIR:[id number] DOB: 02/12/1951 DOA: 05/26/2021 PCP: [name], MD    No chief complaint on file.   Brief Narrative Terry Mitchell is Terry Mitchell 70 y.o. male with medical history significant of metastatic prostate cancer with mets to the bones.  He had finished his radiation therapy approximately [name] week ago. Patient presented secondary to generalized weakness and decreased oral intake. On admission, he was found to have evidence of pneumonia.  Assessment & Plan:   Principal Problem:   Prostate cancer metastatic to bone [facility]) Active Problems:   Paraplegia ([place])   CAP (community acquired pneumonia)   AKI (acute kidney injury) ([place])   Hypercalcemia   Hypokalemia   Anemia   Pancytopenia (HCC)   Elevated alkaline phosphatase level   Decreased appetite   Dysphagia   DVT (deep venous thrombosis) (H[facility])   Edema   Abdominal distension   Elevated serum creatinine  * Prostate cancer metastatic to bone [facility]) Follows with [facility] oncology and neurosurgery May need to reach out to ensure on same page with medications (vs discussing with our oncologists) - will try to call his doc today   Of note, previously had been enrolled in hospice, now revoked  Paraplegia [facility]) Seen by neurosurgery in 04/2021 hospital visit.  At that time, sx concerning for cervical myelopathy.  Not surgical candidate, steroid taper, radiation therapy and inpatient rehab.  Discharged to inpatient rehab 05/06/2021.   I discussed his MRI C/T/L from 11/7 spine with nsgy on call, they recommended outpatient follow up with his oncologist and neurosurgeon outpatient.     Unsafe discharge at this point as patient's daughter disabled, unable to care for him at home herself.   CAP (community acquired pneumonia) Completed course of levaquin Stable on room air  Hypercalcemia Resolved with IVF  AKI (acute kidney injury) ([place]) Resolved   Hypokalemia Replace and follow   Anemia Anemia  downtrending today, other counts ok Will trend Follow anemia labs  Decreased appetite Poor intake over past several meals, 0-25%  Elevated alkaline phosphatase level 2/2 metastatic disease  Pancytopenia ([place]) follow  DVT (deep venous thrombosis) (HCC) Left peroneal, popliteal DVT LUE [place] with UE edema, follow Continue eliquis   Dysphagia Presumed radiation induced esophagitis Dysphagia 3 diet per SLP PPI   Edema Follow on oral lasix, most notable in LUE today  Abdominal distension Due to fecal impaction, now improved Follow with bowel regimen     DVT prophylaxis: eliquis Code Status: DNR Family Communication: daughter Disposition:   Status is: Inpatient  Remains inpatient appropriate because: unsafe discharge plan       Consultants:  none  Procedures:  LE [place] Summary:     Right:  No evidence of thrombosis in the subclavian.     Left:  No evidence of deep vein thrombosis in the upper extremity. No evidence of  superficial vein thrombosis in the upper extremity.   Antimicrobials:  Anti-infectives (From admission, onward)    Start     Dose/Rate Route Frequency Ordered Stop   05/29/21 1145  levofloxacin (LEVAQUIN) IVPB 750 mg        750 mg 100 mL/hr over 90 Minutes Intravenous Every 24 hours 05/29/21 1134 05/31/21 1430   05/28/21 1000  levofloxacin (LEVAQUIN) IVPB 750 mg  Status:  Discontinued        750 mg 100 mL/hr over 90 Minutes Intravenous Every 48 hours 05/26/21 1624 05/29/21 1134   05/26/21 1730  levofloxacin (LEVAQUIN) IVPB  750 mg  Status:  Discontinued        750 mg 100 mL/hr over 90 Minutes Intravenous Every 24 hours 05/26/21 1716 05/26/21 1719   05/26/21 1545  levofloxacin (LEVAQUIN) IVPB 750 mg        750 mg 100 mL/hr over 90 Minutes Intravenous  Once 05/26/21 1533 05/26/21 1711          Subjective: Denies any complaints  Objective: Vitals:   06/08/21 0748 06/08/21 1200 06/08/21 1400 06/08/21 1610  BP: 110/68 113/65 109/66  111/67  Pulse: 87 86 93 88  Resp: 16 17 18 18   Temp: 98 F (36.7 C) 98.3 F (36.8 C) 98.2 F (36.8 C) 98 F (36.7 C)  TempSrc: Oral Oral Oral Oral  SpO2: 96% 96% 98% 95%  Weight:      Height:        Intake/Output Summary (Last 24 hours) at 06/08/2021 1734 Last data filed at 06/08/2021 1730 Gross per 24 hour  Intake 960 ml  Output 1450 ml  Net -490 ml   Filed Weights   05/26/21 1349  Weight: 89 kg    Examination:  General: No acute distress. Cardiovascular: RRR Lungs: unlabored Abdomen: Soft, nontender, nondistended  Neurological: lower>upper extremity weakness Skin: Warm and dry. No rashes or lesions. Extremities: No clubbing or cyanosis. No edema.   Data Reviewed: I have personally reviewed following labs and imaging studies  CBC: Recent Labs  Lab 06/07/21 0419 06/08/21 0749  WBC 5.3 5.1  NEUTROABS  --  4.1  HGB 7.3* 7.6*  HCT 23.3* 24.4*  MCV 97.9 98.8  PLT 175 295    Basic Metabolic Panel: Recent Labs  Lab 06/02/21 1151 06/03/21 1307 06/04/21 0436 06/07/21 0419 06/08/21 0749  NA  --   --  140 138 137  K 2.9*  --  3.6 3.5 3.4*  CL  --   --  107 105 103  CO2  --   --  27 25 25   GLUCOSE  --   --  101* 92 88  BUN  --   --  21 18 17   CREATININE  --   --  0.95 0.92 0.91  CALCIUM  --   --  8.8* 9.4 9.4  MG  --  1.9  --   --   --     GFR: Estimated Creatinine Clearance: 85.3 mL/min (by C-G formula based on SCr of 0.91 mg/dL).  Liver Function Tests: Recent Labs  Lab 06/08/21 0749  AST 28  ALT 9  ALKPHOS 608*  BILITOT 0.8  PROT 4.7*  ALBUMIN 2.0*    CBG: No results for input(s): GLUCAP in the last 168 hours.   Recent Results (from the past 240 hour(s))  Resp Panel by RT-PCR (Flu [name]&B, Covid) Nasopharyngeal Swab     Status: None   Collection Time: 06/01/21  9:42 AM   Specimen: Nasopharyngeal Swab; Nasopharyngeal(NP) swabs in vial transport medium  Result Value Ref Range Status   SARS Coronavirus 2 by RT PCR NEGATIVE NEGATIVE Final     Comment: (NOTE) SARS-CoV-2 target nucleic acids are NOT DETECTED.  The SARS-CoV-2 RNA is generally detectable in upper respiratory specimens during the acute phase of infection. The lowest concentration of SARS-CoV-2 viral copies this assay can detect is 138 copies/mL. Arvind Mexicano negative result does not preclude SARS-Cov-2 infection and should not be used as the sole basis for treatment or other patient management decisions. Bernetta Sutley negative result may occur with  improper specimen collection/handling, submission of specimen  other than nasopharyngeal swab, presence of viral mutation(s) within the areas targeted by this assay, and inadequate number of viral copies(<138 copies/mL). Amelia Burgard negative result must be combined with clinical observations, patient history, and epidemiological information. The expected result is Negative.  Fact Sheet for Patients:  EntrepreneurPulse.com.au  Fact Sheet for Healthcare Providers:  IncredibleEmployment.be  This test is no t yet approved or cleared by the [place] FDA and  has been authorized for detection and/or diagnosis of SARS-CoV-2 by FDA under an Emergency Use Authorization (EUA). This EUA will remain  in effect (meaning this test can be used) for the duration of the COVID-19 declaration under Section 564(b)(1) of the Act, 21 U.S.C.section 360bbb-3(b)(1), unless the authorization is terminated  or revoked sooner.       Influenza Miqueas Whilden by PCR NEGATIVE NEGATIVE Final   Influenza B by PCR NEGATIVE NEGATIVE Final    Comment: (NOTE) The Xpert Xpress SARS-CoV-2/FLU/RSV plus assay is intended as an aid in the diagnosis of influenza from Nasopharyngeal swab specimens and should not be used as Stefannie Defeo sole basis for treatment. Nasal washings and aspirates are unacceptable for Xpert Xpress SARS-CoV-2/FLU/RSV testing.  Fact Sheet for Patients: EntrepreneurPulse.com.au  Fact Sheet for Healthcare  Providers: IncredibleEmployment.be  This test is not yet approved or cleared by the [place] FDA and has been authorized for detection and/or diagnosis of SARS-CoV-2 by FDA under an Emergency Use Authorization (EUA). This EUA will remain in effect (meaning this test can be used) for the duration of the COVID-19 declaration under Section 564(b)(1) of the Act, 21 U.S.C. section 360bbb-3(b)(1), unless the authorization is terminated or revoked.  Performed at [facility], [place] [address] [place], [place] [address]          Radiology Studies: VAS [place] UPPER EXTREMITY VENOUS DUPLEX  Result Date: 06/07/2021 UPPER VENOUS STUDY  Patient Name:  GUSTABO GORDILLO  Date of Exam:   06/07/2021 Medical Rec #: [id number]         Accession #:    [id number] Date of Birth: Dec 10, 1950         Patient Gender: M Patient Age:   19 years Exam Location:  [facility] Procedure:      VAS [place] UPPER EXTREMITY VENOUS DUPLEX Referring Phys: Bryleigh Ottaway POWELL JR --------------------------------------------------------------------------------  Indications: Edema Risk Factors: None identified. Comparison Study: No prior studies. Performing Technologist: Oliver Hum RVT  Examination Guidelines: Fina Heizer complete evaluation includes B-mode imaging, spectral Doppler, color Doppler, and power Doppler as needed of all accessible portions of each vessel. Bilateral testing is considered an integral part of Jonuel Butterfield complete examination. Limited examinations for reoccurring indications may be performed as noted.  Right Findings: +----------+------------+---------+-----------+----------+-------+ RIGHT     CompressiblePhasicitySpontaneousPropertiesSummary +----------+------------+---------+-----------+----------+-------+ Subclavian    Full       Yes       Yes                      +----------+------------+---------+-----------+----------+-------+  Left Findings:  +----------+------------+---------+-----------+----------+-------+ LEFT      CompressiblePhasicitySpontaneousPropertiesSummary +----------+------------+---------+-----------+----------+-------+ IJV           Full       Yes       Yes                      +----------+------------+---------+-----------+----------+-------+ Subclavian    Full       Yes       Yes                      +----------+------------+---------+-----------+----------+-------+  Axillary      Full       Yes       Yes                      +----------+------------+---------+-----------+----------+-------+ Brachial      Full       Yes       Yes                      +----------+------------+---------+-----------+----------+-------+ Radial        Full                                          +----------+------------+---------+-----------+----------+-------+ Ulnar         Full                                          +----------+------------+---------+-----------+----------+-------+ Cephalic      Full                                          +----------+------------+---------+-----------+----------+-------+ Basilic       Full                                          +----------+------------+---------+-----------+----------+-------+  Summary:  Right: No evidence of thrombosis in the subclavian.  Left: No evidence of deep vein thrombosis in the upper extremity. No evidence of superficial vein thrombosis in the upper extremity.  *See table(s) above for measurements and observations.  Diagnosing physician: Harold Barban MD Electronically signed by Harold Barban MD on 06/07/2021 at 8:55:12 PM.    Final         Scheduled Meds:  amLODipine  7.5 mg Oral Daily   apixaban  5 mg Oral BID   furosemide  20 mg Oral Daily   lactose free nutrition  237 mL Oral TID WC   magic mouthwash w/lidocaine  5 mL Oral QID   multivitamin with minerals  1 tablet Oral Daily   polyethylene glycol  17 g Oral Daily    sucralfate  1 g Oral TID WC & HS   Continuous Infusions:   LOS: 13 days    Time spent: over 30 min    Fayrene Helper, MD [facility]   To contact the attending provider between 7A-7P or the covering provider during after hours 7P-7A, please log into the web site [email or web address] and access using universal [place] password for that web site. If you do not have the password, please call the hospital operator.  06/08/2021, 5:34 PM

## 2021-06-09 LAB — COMPREHENSIVE METABOLIC PANEL
ALT: 9 U/L (ref 0–44)
AST: 27 U/L (ref 15–41)
Albumin: 2.2 g/dL — ABNORMAL LOW (ref 3.5–5.0)
Alkaline Phosphatase: 636 U/L — ABNORMAL HIGH (ref 38–126)
Anion gap: 8 (ref 5–15)
BUN: 16 mg/dL (ref 8–23)
CO2: 25 mmol/L (ref 22–32)
Calcium: 9.8 mg/dL (ref 8.9–10.3)
Chloride: 104 mmol/L (ref 98–111)
Creatinine, Ser: 0.89 mg/dL (ref 0.61–1.24)
GFR, Estimated: >60 mL/min (ref >60)
Glucose, Bld: 91 mg/dL (ref 70–99)
Potassium: 3.7 mmol/L (ref 3.5–5.1)
Sodium: 137 mmol/L (ref 135–145)
Total Bilirubin: 0.8 mg/dL (ref 0.3–1.2)
Total Protein: 4.8 g/dL — ABNORMAL LOW (ref 6.5–8.1)

## 2021-06-09 LAB — PSA: Prostatic Specific Antigen: >1500 ng/mL — ABNORMAL HIGH (ref 0.00–4.00)

## 2021-06-09 LAB — CBC WITH DIFFERENTIAL/PLATELET
Abs Immature Granulocytes: 0.08 10*3/uL — ABNORMAL HIGH (ref 0.00–0.07)
Basophils Absolute: 0 10*3/uL (ref 0.0–0.1)
Basophils Relative: 0 %
Eosinophils Absolute: 0 10*3/uL (ref 0.0–0.5)
Eosinophils Relative: 0 %
HCT: 23.8 % — ABNORMAL LOW (ref 39.0–52.0)
Hemoglobin: 7.3 g/dL — ABNORMAL LOW (ref 13.0–17.0)
Immature Granulocytes: 2 %
Lymphocytes Relative: 11 %
Lymphs Abs: 0.6 10*3/uL — ABNORMAL LOW (ref 0.7–4.0)
MCH: 30.2 pg (ref 26.0–34.0)
MCHC: 30.7 g/dL (ref 30.0–36.0)
MCV: 98.3 fL (ref 80.0–100.0)
Monocytes Absolute: 0.5 10*3/uL (ref 0.1–1.0)
Monocytes Relative: 10 %
Neutro Abs: 3.9 10*3/uL (ref 1.7–7.7)
Neutrophils Relative %: 77 %
Platelets: 177 10*3/uL (ref 150–400)
RBC: 2.42 MIL/uL — ABNORMAL LOW (ref 4.22–5.81)
RDW: 17.2 % — ABNORMAL HIGH (ref 11.5–15.5)
WBC: 5.1 10*3/uL (ref 4.0–10.5)
nRBC: 0 % (ref 0.0–0.2)

## 2021-06-09 LAB — IRON AND TIBC
Iron: 34 ug/dL — ABNORMAL LOW (ref 45–182)
Saturation Ratios: 31 % (ref 17.9–39.5)
TIBC: 111 ug/dL — ABNORMAL LOW (ref 250–450)
UIBC: 77 ug/dL

## 2021-06-09 LAB — FERRITIN: Ferritin: 509 ng/mL — ABNORMAL HIGH (ref 24–336)

## 2021-06-09 LAB — VITAMIN B12: Vitamin B-12: 1320 pg/mL — ABNORMAL HIGH (ref 180–914)

## 2021-06-09 LAB — MAGNESIUM: Magnesium: 2.1 mg/dL (ref 1.7–2.4)

## 2021-06-09 LAB — FOLATE: Folate: 6.8 ng/mL (ref >5.9)

## 2021-06-09 LAB — PHOSPHORUS: Phosphorus: 2.7 mg/dL (ref 2.5–4.6)

## 2021-06-09 MED ORDER — BISACODYL 10 MG RE SUPP
10.0000 mg | Freq: Once | RECTAL | Status: AC
  Administered 2021-06-09: 10 mg via RECTAL
  Filled 2021-06-09: qty 1

## 2021-06-09 MED ORDER — BISACODYL 10 MG RE SUPP: 10.0000 mg | RECTAL | 0 refills | Status: DC | PRN

## 2021-06-09 MED ORDER — FUROSEMIDE 20 MG PO TABS
20.0000 mg | ORAL_TABLET | Freq: Every day | ORAL | 0 refills | Status: AC
Start: 2021-06-10 — End: 2021-07-10

## 2021-06-09 MED ORDER — PANTOPRAZOLE SODIUM 40 MG PO TBEC: 40.0000 mg | DELAYED_RELEASE_TABLET | Freq: Every day | ORAL | 0 refills | Status: AC

## 2021-06-09 NOTE — Unmapped (Signed)
Hi,    Dr. Lowell Guitar with [facility] has contacted the Communication Center requesting to speak with you directly regarding the following:    P2P consult    Dr. Lowell Guitar is available for a call back between 8 & 5.  The best number to call back is [phone]    A page has also been sent.    Thank you,  Rosary Lively  [facility]  [phone]

## 2021-06-09 NOTE — Plan of Care (Signed)

## 2021-06-09 NOTE — Progress Notes (Signed)
Assessment unchanged. Pt verbalized understanding the he was being transported to [facility]. PTAR her to carry pt via ambulance to facility.

## 2021-06-09 NOTE — Progress Notes (Signed)
Report called and given to Marya Amsler, LPN at [facility]. Awaiting PTAR to arrive to transport pt to facility.

## 2021-06-09 NOTE — Progress Notes (Signed)
Speech Language Pathology Treatment: Dysphagia  Patient Details Name: Terry Mitchell MRN: [id number] DOB: 01-01-1951 Today's Date: 06/09/2021 Time: 1050-1102 SLP Time Calculation (min) (ACUTE ONLY): 12 min  Assessment / Plan / Recommendation Clinical Impression  Pt reports no pain with swallowing. When asked about his opinion about mels, he would like more variety, particularly with dessert. Pt observed with thin liquids and a nutrigrain bar which he self fed, requesting upright position first. Pt coughing softly prior to and after PO, does not appear related to PO intake. Precautions reviewed. Pt now independent and ready for unrestricted diet to maximize nutrition. Will upgrade to regular and sign off.   HPI HPI: Terry Mitchell is a 70 y.o. male history of hypertension, prostate cancer with spinal, rib, and other bony metastasis, present emergency department with chest pain and arm pain.  CXR remarkable for opacity in the R lower lobe.  MRI total spine from 04/05/21 shows diffuse metastatic disease with epidural thickening and spinal canal stenosis from C3-5 without cord signal change, and left foraminal invasion at C5-6.  Pt completed inpatient rehab with PT & OT (no ST) at [facility] from 10/15-11/01.      SLP Plan  Continue with current plan of care      Recommendations for follow up therapy are one component of a multi-disciplinary discharge planning process, led by the attending physician.  Recommendations may be updated based on patient status, additional functional criteria and insurance authorization.    Recommendations  Diet recommendations: Regular;Thin liquid Liquids provided via: Cup;Straw Medication Administration: Whole meds with liquid Supervision: Patient able to self feed Compensations: Slow rate;Small sips/bites Postural Changes and/or Swallow Maneuvers: Seated upright 90 degrees                Oral Care Recommendations: Oral care BID Follow Up Recommendations: No  SLP follow up SLP Visit Diagnosis: Dysphagia, unspecified (R13.10) Plan: Continue with current plan of care       GO                Venkat Ankney, Katherene Ponto  06/09/2021, 11:46 AM

## 2021-06-09 NOTE — Care Management Important Message (Signed)
Important Message  Patient Details IM Letter placed in Patients room. Name: Terry Mitchell MRN: [id number] Date of Birth: Nov 29, 1950   Medicare Important Message Given:  Yes     Kerin Salen 06/09/2021, 10:52 AM

## 2021-06-09 NOTE — TOC Transition Note (Signed)
Transition of Care [facility]) - CM/SW Discharge Note   Patient Details  Name: Terry Mitchell MRN: [id number] Date of Birth: May 20, 1951  [facility] CM/SW Contact:  Lennart Pall, LCSW Phone Number: 06/09/2021, 2:01 PM   Clinical Narrative:    Have received SNF bed offer from [facility] in [place]. Have secured insurance authorization [phone]).  Medically cleared for dc today.  PTAR called at 1:45pm.  RN to call report to [phone].  Have updated [facility] of pt's discharge to SNF and request that Palliative team continue to follow as outpatient.  No further TOC needs.   Final next level of care: Skilled Nursing Facility Barriers to Discharge: Barriers Resolved   Patient Goals and CMS Choice Patient states their goals for this hospitalization and ongoing recovery are:: to regain strength      Discharge Placement              Patient chooses bed at: [facility] ((now [facility] Patient to be transferred to facility by: [place] Name of family member notified: daughter, [name] Patient and family notified of of transfer: 06/09/21  Discharge Plan and Services In-house Referral: Clinical Social Work   Post Acute Care Choice: [place]          DME Arranged: N/A DME Agency: NA                  Social Determinants of Health ([place]) Interventions     Readmission Risk Interventions No flowsheet data found.

## 2021-06-09 NOTE — Discharge Summary (Addendum)
Physician Discharge Summary  [name] UKG:[id number] DOB: [birth year] DOA: 05/26/2021  PCP: [name], MD  Admit date: 05/26/2021 Discharge date: 06/09/2021  Time spent: 40 minutes  Recommendations for Outpatient Follow-up:  Follow outpatient CBC/CMP Palliative care to follow at SNF - continue goals of care discussions Please facilitate conversation with Dr. Eliane Decree as able (televisit) given difficulty of following at [place] - follow with outpatient oncology/neurosurgery as able/indicated per goc Continue bowel regimen, suppository prn  Discharge Diagnoses:  Principal Problem:   Prostate cancer metastatic to bone [facility]) Active Problems:   Paraplegia ([place])   CAP (community acquired pneumonia)   AKI (acute kidney injury) ([place])   Hypercalcemia   Hypokalemia   Anemia   Pancytopenia (HCC)   Elevated alkaline phosphatase level   Decreased appetite   Dysphagia   DVT (deep venous thrombosis) (HCC)   Edema   Abdominal distension   Elevated serum creatinine   Discharge Condition: stable  Diet recommendation: regular  Filed Weights   05/26/21 1349  Weight: 89 kg    History of present illness:  Terry Mitchell is Terry Mitchell 70 y.o. male with medical history significant of metastatic prostate cancer with mets to the bones.  He had finished his radiation therapy approximately Dilyn Osoria week ago. Patient presented secondary to generalized weakness and decreased oral intake. On admission, he was found to have evidence of pneumonia.  He's been treated for [place] pneumonia, but continues to have decreased PO intake.  Suspect his decline is related to his metastatic disease.  Discussion with oncology at [facility] on 11/18.  Recommending hospice at that point, will need continued discussions outpatient.  Palliative to follow at SNF.  Hospital Course:  * Prostate cancer metastatic to bone [facility]) Follows with [facility] oncology and neurosurgery May need to reach out to ensure on same page with medications (vs  discussing with our oncologists)  Discussed with Dr. Eliane Decree today, with poor overall clinical status, prognosis is poor.  He recommended hospice.  Recommended following up PSA (>1,500  ---> his last in care everywhere was 1694.29, so difficult to compare directly as our upper limit appears to have been 1500).  I've discussed hospice recommendation with patient and his daughter, I think they'll probably need to continue to discuss this, though they do seem open.  Will recommend palliative to follow outpatient at SNF as well as recommend they reach out and discuss with Dr. Eliane Decree with tele visit if possible to help with understanding given difficulty of arranging follow up in [place].   Of note, previously had been enrolled in hospice, now revoked  Parapleg[facility]) Seen by neurosurgery in 04/2021 hospital visit.  At that time, sx concerning for cervical myelopathy.  Not surgical candidate, steroid taper, radiation therapy and inpatient rehab.  Discharged to inpatient rehab 05/06/2021.   I discussed his MRI C/T/L from 11/7 spine with nsgy on call, they recommended outpatient follow up with his oncologist and neurosurgeon outpatient.     Unsafe discharge at this point as patient's daughter disabled, unable to care for him at home herself.   CAP (community acquired pneumonia) Completed course of levaquin Stable on room air  Hypercalcemia Resolved with IVF  AKI (acute kidney injury) ([place]) Resolved   Hypokalemia Replace and follow   Anemia Anemia downtrending today, other counts ok Will trend Follow anemia labs - iron def, normal folate and elevated b12. 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 Regular diet PPI, sucralfate  Edema Follow on oral lasix, most notable in LUE today  Abdominal distension Due to fecal impaction, now improved Follow with bowel regimen, suppository prn  Plain film with infiltrative sclerotic appearance of proximal L femoral shaft, suspect metastatic disease    Procedures: 1 unit pRBC  Consultations: Palliative care NSGY over phone [facility] oncology over phone  Discharge Exam: Vitals:   06/08/21 2246 06/09/21 0546  BP: 124/63 (!) 102/58  Pulse: 93 91  Resp: 16 18  Temp: 98.1 F (36.7 C) 98.2 F (36.8 C)  SpO2: 93% (!) 88%   Seems to understand conversation that we had discussing goals of care Open to discussing further Discussed with daughter over phone as well  General: No acute distress. Cardiovascular: Heart sounds show Effa Yarrow regular rate, and rhythm. No gallops or rubs. No murmurs. No JVD. Lungs: Clear to auscultation bilaterally with good air movement. No rales, rhonchi or wheezes. Abdomen: Soft, nontender, mild distension  Neurological: Alert and oriented 3. Moves all extremities 4h. Cranial nerves II through XII grossly intact. Skin: Warm and dry. No rashes or lesions. Extremities:LUE edema  Discharge Instructions   Discharge Instructions     Call MD for:  difficulty breathing, headache or visual disturbances   Complete by: As directed    Call MD for:  extreme fatigue   Complete by: As directed    Call MD for:  hives   Complete by: As directed    Call MD for:  persistant dizziness or light-headedness   Complete by: As directed    Call MD for:  persistant nausea and vomiting   Complete by: As directed    Call MD for:  redness, tenderness, or signs of infection (pain, swelling, redness, odor or green/yellow discharge around incision site)   Complete by: As directed    Call MD for:  severe uncontrolled pain   Complete by: As directed     Call MD for:  temperature >100.4   Complete by: As directed    Diet - low sodium heart healthy   Complete by: As directed    Discharge instructions   Complete by: As directed    You were seen for generalized weakness and poor PO intake.  You've been treated for pneumonia.  I think your general decline is related to your metastatic prostate cancer.  I had Darica Goren conversation with Dr. Eliane Decree about your care and we think hospice will be important to consider going forward.  Please call the [facility] to follow up with Dr. Eliane Decree with Latarra Eagleton phone conversation.    At this point in time, we're treating your presumed radiation esophagitis with sucralfate and will discharge you on an acid blocker (protonix).    You're on pain medicines which cause constipation.  We'll send you with Domonic Hiscox bowel regimen to take at SNF.   We'll discharge you with some lasix for swelling.  You should follow up labs with your SNF doctors within Emari Demmer week or so.  Continue to discuss goals of care and next steps with your outpatient providers and palliative care as an outpatient.  Return for new, recurrent, or worsening symptoms.  Please ask your PCP to request records from this hospitalization so they know what was  done and what the next steps will be.   Increase activity slowly   Complete by: As directed    No wound care   Complete by: As directed    No wound care   Complete by: As directed       Allergies as of 06/09/2021       Reactions   Penicillins Hives, Other (See Comments)   Hives, welts, swelling profusely . Pretty severe reaction Has patient had Alasdair Kleve PCN reaction causing immediate rash, facial/tongue/throat swelling, SOB or lightheadedness with hypotension: No Has patient had Sharlyn Odonnel PCN reaction causing severe rash involving mucus membranes or skin necrosis: Yes Has patient had Dyane Broberg PCN reaction that required hospitalization: Yes Has patient had Jaislyn Blinn PCN reaction occurring within the last 10 years: No If all of the above  answers are "NO", then may proceed with Cephalosporin use.   Lactose Intolerance (gi) Other (See Comments)   Gi upset        Medication List     STOP taking these medications    acetaminophen 325 MG tablet Commonly known as: TYLENOL   benzonatate 100 MG capsule Commonly known as: TESSALON   dexamethasone 2 MG tablet Commonly known as: DECADRON   enoxaparin 40 MG/0.4ML injection Commonly known as: LOVENOX   gabapentin 300 MG capsule Commonly known as: NEURONTIN   HYDROmorphone 2 MG tablet Commonly known as: Dilaudid   morphine 15 MG tablet Commonly known as: MSIR   oxyCODONE-acetaminophen 5-325 MG tablet Commonly known as: Percocet       TAKE these medications    albuterol 108 (90 Base) MCG/ACT inhaler Commonly known as: VENTOLIN HFA Inhale 2 puffs into the lungs every 6 (six) hours as needed for wheezing or shortness of breath.   amLODipine 5 MG tablet Commonly known as: NORVASC Take 1.5 tablets (7.5 mg total) by mouth daily. What changed: how much to take   atorvastatin 40 MG tablet Commonly known as: LIPITOR Take 40 mg by mouth daily.   bisacodyl 10 MG suppository Commonly known as: Dulcolax Place 1 suppository (10 mg total) rectally as needed for moderate constipation.   DULoxetine 60 MG capsule Commonly known as: CYMBALTA Take 60 mg by mouth daily.   Eliquis 5 MG Tabs tablet Generic drug: apixaban Take 5 mg by mouth 2 (two) times daily.   feeding supplement Liqd Take 237 mLs by mouth 2 (two) times daily between meals.   (feeding supplement) PROSource Plus liquid Take 30 mLs by mouth 2 (two) times daily between meals.   FeroSul 325 (65 FE) MG tablet Generic drug: ferrous sulfate Take 325 mg by mouth every other day.   furosemide 20 MG tablet Commonly known as: LASIX Take 1 tablet (20 mg total) by mouth daily. Start taking on: June 10, 2021   magic mouthwash w/lidocaine Soln Take 5 mLs by mouth 4 (four) times daily for 9 days.    multivitamin with minerals Tabs tablet Take 1 tablet by mouth daily.   ondansetron 4 MG tablet Commonly known as: ZOFRAN Take 2 tablets (8 mg total) by mouth every 8 (eight) hours as needed for nausea or vomiting.   oxyCODONE 5 MG immediate release tablet Commonly known as: Oxy IR/ROXICODONE Take 2 tablets (10 mg total) by mouth every 4 (four) hours as needed for severe pain.   pantoprazole 40 MG tablet Commonly known as: Protonix Take 1 tablet (40 mg total) by mouth daily.   polyethylene glycol 17 g packet Commonly known as: MIRALAX / GLYCOLAX Take 17  g by mouth 2 (two) times daily.   prednisoLONE 5 MG Tabs tablet Take 5 mg by mouth daily.   sucralfate 1 GM/10ML suspension Commonly known as: CARAFATE Take 10 mLs (1 g total) by mouth 4 (four) times daily -  with meals and at bedtime for 10 days.   vitamin B-12 1000 MCG tablet Commonly known as: CYANOCOBALAMIN Take 1 tablet (1,000 mcg total) by mouth daily.       Allergies  Allergen Reactions   Penicillins Hives and Other (See Comments)    Hives, welts, swelling profusely . Pretty severe reaction Has patient had Valor Quaintance PCN reaction causing immediate rash, facial/tongue/throat swelling, SOB or lightheadedness with hypotension: No Has patient had Yaseen Gilberg PCN reaction causing severe rash involving mucus membranes or skin necrosis: Yes Has patient had Padme Arriaga PCN reaction that required hospitalization: Yes Has patient had Isaly Fasching PCN reaction occurring within the last 10 years: No If all of the above answers are "NO", then may proceed with Cephalosporin use.    Lactose Intolerance (Gi) Other (See Comments)    Gi upset    Follow-up Information     [name], MD Follow up.   Specialty: Family Medicine Contact information: [place] [address] [phone]         Fritz Pickerel, MD Follow up.   Specialty: Oncology Why: Please call to arrange tele visit Contact information: [id number] [place] [address] [phone]                  The results of significant diagnostics from this hospitalization (including imaging, microbiology, ancillary and laboratory) are listed below for reference.    Significant Diagnostic Studies: MR CERVICAL SPINE W WO CONTRAST  Result Date: 05/30/2021 CLINICAL DATA:  Initial evaluation for metastatic prostate cancer. Paraplegia. EXAM: MRI CERVICAL SPINE WITHOUT AND WITH CONTRAST TECHNIQUE: Multiplanar and multiecho pulse sequences of the cervical spine, to include the craniocervical junction and cervicothoracic junction, were obtained without and with intravenous contrast. CONTRAST:  51mL GADAVIST GADOBUTROL 1 MMOL/ML IV SOLN COMPARISON:  None available. FINDINGS: Alignment: Reversal of the normal cervical lordosis, apex at C6. No listhesis. Vertebrae: Abnormal signal intensity seen diffusely throughout the visualized osseous structures, consistent with osseous metastatic disease. There is likely involvement of all levels. Superimposed marrow edema and enhancement at C4 through C7 suspected to reflect superimposed radiation changes. Height loss involving the C5, C6, and C7 vertebral bodies noted, favored to be chronic and degenerative. Vertebral bodies are partially ankylosed at these levels. Vertebral body height otherwise maintained with no other pathologic fracture. Probable subtle extraosseous extension with involvement of the ventral epidural space at approximately C4 through C6. Epidural extension into the left neural foramina of C4-5 through C7-T1. Probable epidural extension into the right neural foramina at C6-7 and C7-T1. Cord: Normal signal and morphology. No cord signal changes to suggest myelopathy. Posterior Fossa, vertebral arteries, paraspinal tissues: Visualized brain and posterior fossa within normal limits. Craniocervical junction normal. Diffuse soft tissue swelling with edema and layering retropharyngeal effusion seen involving the  prevertebral/retropharyngeal soft tissues, most pronounced at C4-5. Additional edema within the left greater than right paraspinous and soft tissues of the neck. Findings likely reflect post radiation changes. No loculated collections. Normal flow voids seen within the vertebral arteries bilaterally. Disc levels: C2-C3: Mild degenerative intervertebral disc space narrowing with disc desiccation. Right greater than left uncovertebral spurring without significant disc bulge. Severe right with mild-to-moderate left facet arthrosis. No significant  spinal stenosis. Severe right C3 foraminal narrowing. Left neural foramen remains patent. C3-C4: Degenerative intervertebral disc space narrowing. Central disc protrusion indents the ventral thecal sac, contacting and mildly flattening the ventral cord. Associated annular fissure. Severe right with moderate left facet arthrosis. Mild bilateral uncovertebral spurring. Moderate spinal stenosis with severe bilateral C4 foraminal narrowing. C4-C5: Degenerative intervertebral disc space narrowing. Broad-based central disc osteophyte complex indents and largely effaces the ventral thecal sac. Mild cord flattening without cord signal changes. Moderate right worse than left facet hypertrophy. Moderate spinal stenosis. Moderate right C5 foraminal narrowing. Probable tumor extension into the left neural foramen with moderate left foraminal stenosis. C5-C6: Advanced degenerative intervertebral disc space narrowing with diffuse disc osteophyte complex. C5 and C6 vertebral bodies are partially ankylosed anteriorly. Flattening and partial effacement of the ventral thecal sac, asymmetric to the right. Mild cord flattening without cord signal changes. Moderate spinal stenosis. Right worse than left uncovertebral spurring with resultant moderate right C6 foraminal narrowing. Tumor extension into the left neural foramen with moderate to severe left foraminal stenosis. C6-C7: Advanced  degenerative intervertebral disc space narrowing with diffuse disc osteophyte complex. C6 and C7 vertebral bodies are partially ankylosed. Broad posterior component flattens and effaces the ventral thecal sac, asymmetric to the right. Mild cord flattening without cord signal changes. Moderate spinal stenosis. Superimposed uncovertebral hypertrophy with tumor extension into the bilateral neural foramina. Resultant fairly severe bilateral foraminal stenosis. C7-T1: Moderate degenerative intervertebral disc space narrowing with diffuse disc bulge, asymmetric to the right. Superimposed endplate and uncovertebral spurring. Probable tumor extension into the right greater than left neural foramina. Mild to moderate spinal stenosis. Severe right worse than left C8 foraminal narrowing. IMPRESSION: 1. Diffuse osseous metastatic disease throughout the cervical spine. 2. Probable subtle extraosseous extension of tumor into the ventral epidural space at C4 through C6, with additional epidural involvement into the left neural foramina at C4-5 through C7-T1, and the right neural foramina at C6-7 and C7-T1. 3. Superimposed edema and enhancement involving the C4 through C7 vertebral bodies, suspected to reflect post radiation changes. Similarly, prevertebral edema and effusion also suspected to reflect post treatment changes. Correlation with history recommended. 4. Underlying advanced multilevel degenerative spondylosis with resultant moderate diffuse spinal stenosis at C3-4 through C6-7. No cord signal changes to suggest myelopathy. Associated moderate to severe bilateral C3 through C8 foraminal narrowing as above. Electronically Signed   By: Jeannine Boga M.D.   On: 05/30/2021 02:57   MR THORACIC SPINE W WO CONTRAST  Result Date: 05/30/2021 CLINICAL DATA:  Initial evaluation for metastatic prostate cancer, status post chemo radiation. EXAM: MRI THORACIC WITHOUT AND WITH CONTRAST TECHNIQUE: Multiplanar and multiecho  pulse sequences of the thoracic spine were obtained without and with intravenous contrast. CONTRAST:  38mL GADAVIST GADOBUTROL 1 MMOL/ML IV SOLN COMPARISON:  Previous MRI from 12/11/2020. FINDINGS: Alignment: Examination degraded by motion. Physiologic with preservation of the normal thoracic kyphosis. No interval listhesis or malalignment. Vertebrae: Widespread osseous metastatic disease again seen throughout the thoracic spine. Vertebral body height maintained without interval pathologic fracture. Previously seen extraosseous extension of tumor at the level of T6 is improved, with only Mychal Decarlo small amount of residual epidural tumor now seen at this level. Mild epidural extension into the right ventral epidural space at the levels of T3 and T4, similar to previous. Now seen is somewhat smooth diffuse enhancement involving both the ventral and dorsal epidural space extending from the cervicothoracic junction through T6 (series 52, image 11). Given the relatively smooth morphology of this  finding, this is suspected to in large part reflect post radiation changes, although Nephi Savage small amount of superimposed epidural tumor is likely present as well, most pronounced within the ventral epidural space at the levels of T3 and T4. Progressive epidural lipomatosis also noted within the upper thoracic spine. There is progressive mild to moderate spinal stenosis at the level of T2-3 with minimal cord flattening, but no cord signal changes (series 41, image 7). Osseous expansion and/or early epidural extension of tumor into multiple right-sided neural foramina noted as well, most pronounced at T6-7 (series 51, image 9). Similar but less pronounced changes seen about the left neural foramina, most pronounced at T3-4 (series 51, image 15). Cord: Patchy T2 signal abnormality seen within the thoracic cord at the level of T6 at site of previously seen cord compression, likely reflecting myelomalacia (series 51, image 12). No other convincing  cord signal changes seen on this motion degraded exam. Progressive mild to moderate spinal stenosis at the level of T2-3 as above. No other significant stenosis seen within the thoracic spine. Paraspinal and other soft tissues: Large layering bilateral pleural effusions noted. Paraspinous soft tissues demonstrate no other acute finding. Disc levels: Underlying mild for age spondylosis, not significantly changed or progressed from prior. No other significant spinal stenosis within the thoracic spine. IMPRESSION: 1. Widespread osseous metastatic disease throughout the thoracic spine. No interval pathologic fracture. 2. Interval improvement in previously seen epidural tumor at T6, consistent with response to therapy. No significant residual spinal stenosis at this level. Patchy cord signal abnormality at this level most likely reflects myelomalacia given the prior stenosis. 3. New fairly smooth epidural enhancement extending from the cervicothoracic junction through T6, favored to in large part reflect post radiation changes, although there is undoubtedly Yarixa Lightcap small amount of superimposed epidural tumor at Omni Dunsworth few levels as above. Progressive mild to moderate spinal stenosis at the level of T2-3. Attention at follow-up recommended. 4. Large layering bilateral pleural effusions. Electronically Signed   By: Jeannine Boga M.D.   On: 05/30/2021 04:54   MR Lumbar Spine W Wo Contrast  Result Date: 05/30/2021 CLINICAL DATA:  Initial evaluation for metastatic disease. EXAM: MRI LUMBAR SPINE WITHOUT AND WITH CONTRAST TECHNIQUE: Multiplanar and multiecho pulse sequences of the lumbar spine were obtained without and with intravenous contrast. CONTRAST:  11mL GADAVIST GADOBUTROL 1 MMOL/ML IV SOLN COMPARISON:  Prior MRI from 12/11/2020. FINDINGS: Segmentation: Standard. Same numbering system employed as on previous exam. Alignment: Moderate dextroscoliosis. No interval listhesis or malalignment. Vertebrae: Susceptibility  artifact related to prior posterior fusion at L4 through S1. Underlying widespread osseous metastatic disease again seen, essentially involving all levels as well as the visualized sacrum and pelvis. Vertebral body height maintained without interval pathologic fracture. Interval Schmorl's node deformity at the inferior endplate of J69 noted. Diffusely decreased T1 signal intensity within the underlying bone marrow, progressed from prior, and likely reflecting post treatment changes. No significant epidural extension of tumor within the lumbar spine. Osseous expansion about multiple pedicles with associated mild to moderate foraminal narrowing at multiple levels, most pronounced at T10-11 through L1-2 on the right (series 1, image 7), and L3-4 on the left (series 1, image 14). Conus medullaris and cauda equina: Conus extends to the L1 level. Conus medullaris within normal limits. Visualized nerve roots of the cauda equina are unremarkable. Distal nerve roots are obscured by susceptibility artifact. Paraspinal and other soft tissues: Chronic postoperative changes noted within the lower posterior paraspinous soft tissues. No visible acute soft tissue  abnormality. Disc levels: No significant disc pathology seen through the L3-4 level. Evaluation of the lower lumbar spine limited by extensive susceptibility artifact. No visible significant spinal stenosis. IMPRESSION: 1. Diffuse osseous metastatic disease throughout the lumbar spine. No pathologic fracture or significant epidural extension of tumor. Associated osseous expansion about multiple pedicles with progressive mild to moderate foraminal narrowing on the right at T10-11 through L1-2, and on the left at L3-4. 2. Prior posterior fusion at L4 through S1. No progressive spinal stenosis within the upper lumbar spine. Electronically Signed   By: Jeannine Boga M.D.   On: 05/30/2021 05:19   DG Chest Port 1 View  Result Date: 05/26/2021 CLINICAL DATA:  History  of prostate cancer. EXAM: PORTABLE CHEST 1 VIEW COMPARISON:  Weakness, increased weakness.  Diminished appetite. FINDINGS: Lung volumes are diminished and image slightly rotated. Accounting for this cardiomediastinal contours and hilar structures are stable. Increased opacity in the RIGHT mid chest and RIGHT lower lobe since previous imaging. Sclerotic lesions in the bilateral ribs are again noted. EKG leads project over the chest. IMPRESSION: Areas of increased opacity in the RIGHT lower chest in particular, raising the question of airspace disease/infection. Multifocal sclerotic rib lesions in this patient with known prostate cancer metastases. Electronically Signed   By: Zetta Bills M.D.   On: 05/26/2021 15:06   DG Abd Portable 1V  Result Date: 06/03/2021 CLINICAL DATA:  Abdominal distension EXAM: PORTABLE ABDOMEN - 1 VIEW COMPARISON:  Dec 13, 2020 FINDINGS: Diffuse gaseous distension bowel without frank dilation. Moderate rectal stool ball. IVC filter. Status post posterior fixation of the inferior lumbar spine. Heterogeneous mixed lytic and sclerotic appearance of the inferior RIGHT pubic ramus, multiple ribs, and the spine consistent with known osseous metastatic disease. There is an infiltrative sclerotic appearance of the proximal LEFT femoral shaft. Pelvic phleboliths. IMPRESSION: 1. Nonobstructive bowel gas pattern with moderate rectal stool ball. If persistent concern for obstruction, consider further evaluation with dedicated CT. 2. Infiltrative sclerotic appearance the proximal LEFT femoral shaft. Findings are concerning for additional site of osseous metastatic disease. This could be further evaluated with dedicated femur radiographs or bone scan. Electronically Signed   By: Valentino Saxon M.D.   On: 06/03/2021 15:43   VAS [place] UPPER EXTREMITY VENOUS DUPLEX  Result Date: 06/07/2021 UPPER VENOUS STUDY  Patient Name:  ORENTHAL DEBSKI  Date of Exam:   06/07/2021 Medical Rec #: [id number]          Accession #:    [id number] Date of Birth: October 25, 1950         Patient Gender: M Patient Age:   78 years Exam Location:  [facility] Procedure:      VAS [place] UPPER EXTREMITY VENOUS DUPLEX Referring Phys: Rondy Krupinski POWELL JR --------------------------------------------------------------------------------  Indications: Edema Risk Factors: None identified. Comparison Study: No prior studies. Performing Technologist: Oliver Hum RVT  Examination Guidelines: [place] complete evaluation includes B-mode imaging, spectral Doppler, color Doppler, and power Doppler as needed of all accessible portions of each vessel. Bilateral testing is considered an integral part of Cheyenne Schumm complete examination. Limited examinations for reoccurring indications may be performed as noted.  Right Findings: +----------+------------+---------+-----------+----------+-------+ RIGHT     CompressiblePhasicitySpontaneousPropertiesSummary +----------+------------+---------+-----------+----------+-------+ Subclavian    Full       Yes       Yes                      +----------+------------+---------+-----------+----------+-------+  Left Findings: +----------+------------+---------+-----------+----------+-------+ LEFT      CompressiblePhasicitySpontaneousPropertiesSummary +----------+------------+---------+-----------+----------+-------+  IJV           Full       Yes       Yes                      +----------+------------+---------+-----------+----------+-------+ Subclavian    Full       Yes       Yes                      +----------+------------+---------+-----------+----------+-------+ Axillary      Full       Yes       Yes                      +----------+------------+---------+-----------+----------+-------+ Brachial      Full       Yes       Yes                      +----------+------------+---------+-----------+----------+-------+ Radial        Full                                           +----------+------------+---------+-----------+----------+-------+ Ulnar         Full                                          +----------+------------+---------+-----------+----------+-------+ Cephalic      Full                                          +----------+------------+---------+-----------+----------+-------+ Basilic       Full                                          +----------+------------+---------+-----------+----------+-------+  Summary:  Right: No evidence of thrombosis in the subclavian.  Left: No evidence of deep vein thrombosis in the upper extremity. No evidence of superficial vein thrombosis in the upper extremity.  *See table(s) above for measurements and observations.  Diagnosing physician: Harold Barban MD Electronically signed by Harold Barban MD on 06/07/2021 at 8:55:12 PM.    Final    US Abdomen Limited RUQ (LIVER/GB)  Result Date: 05/26/2021 CLINICAL DATA:  Elevated creatinine EXAM: ULTRASOUND ABDOMEN LIMITED RIGHT UPPER QUADRANT COMPARISON:  CT from 12/13/2020 FINDINGS: Gallbladder: Gallbladder is well distended with gallbladder sludge. No stones are identified. No wall thickening is seen. Common bile duct: Diameter: 6.7 mm which is within normal limits for the patient's given age. Liver: No focal lesion identified. Within normal limits in parenchymal echogenicity. Portal vein is patent on color Doppler imaging with normal direction of blood flow towards the liver. Other: None. IMPRESSION: Gallbladder sludge without acute complicating factors. No other focal abnormality is noted. Electronically Signed   By: Inez Catalina M.D.   On: 05/26/2021 19:41    Microbiology: Recent Results (from the past 240 hour(s))  Resp Panel by RT-PCR (Flu Marda Breidenbach&B, Covid) Nasopharyngeal Swab     Status: None   Collection Time: 06/01/21  9:42 AM   Specimen: Nasopharyngeal Swab;  Nasopharyngeal(NP) swabs in vial transport medium  Result Value Ref Range Status   SARS Coronavirus 2 by  RT PCR NEGATIVE NEGATIVE Final    Comment: (NOTE) SARS-CoV-2 target nucleic acids are NOT DETECTED.  The SARS-CoV-2 RNA is generally detectable in upper respiratory specimens during the acute phase of infection. The lowest concentration of SARS-CoV-2 viral copies this assay can detect is 138 copies/mL. Reida Hem negative result does not preclude SARS-Cov-2 infection and should not be used as the sole basis for treatment or other patient management decisions. Dawt Reeb negative result may occur with  improper specimen collection/handling, submission of specimen other than nasopharyngeal swab, presence of viral mutation(s) within the areas targeted by this assay, and inadequate number of viral copies(<138 copies/mL). Teddrick Mallari negative result must be combined with clinical observations, patient history, and epidemiological information. The expected result is Negative.  Fact Sheet for Patients:  EntrepreneurPulse.com.au  Fact Sheet for Healthcare Providers:  IncredibleEmployment.be  This test is no t yet approved or cleared by the [place] FDA and  has been authorized for detection and/or diagnosis of SARS-CoV-2 by FDA under an Emergency Use Authorization (EUA). This EUA will remain  in effect (meaning this test can be used) for the duration of the COVID-19 declaration under Section 564(b)(1) of the Act, 21 U.S.C.section 360bbb-3(b)(1), unless the authorization is terminated  or revoked sooner.       Influenza Murrel Freet by PCR NEGATIVE NEGATIVE Final   Influenza B by PCR NEGATIVE NEGATIVE Final    Comment: (NOTE) The Xpert Xpress SARS-CoV-2/FLU/RSV plus assay is intended as an aid in the diagnosis of influenza from Nasopharyngeal swab specimens and should not be used as Jeyli Zwicker sole basis for treatment. Nasal washings and aspirates are unacceptable for Xpert Xpress SARS-CoV-2/FLU/RSV testing.  Fact Sheet for Patients: EntrepreneurPulse.com.au  Fact Sheet  for Healthcare Providers: IncredibleEmployment.be  This test is not yet approved or cleared by the [place] FDA and has been authorized for detection and/or diagnosis of SARS-CoV-2 by FDA under an Emergency Use Authorization (EUA). This EUA will remain in effect (meaning this test can be used) for the duration of the COVID-19 declaration under Section 564(b)(1) of the Act, 21 U.S.C. section 360bbb-3(b)(1), unless the authorization is terminated or revoked.  Performed at [facility], [place] [address] [place], [place] [address]      Labs: Basic Metabolic Panel: Recent Labs  Lab 06/03/21 1307 06/04/21 0436 06/07/21 0419 06/08/21 0749 06/09/21 0357  NA  --  140 138 137 137  K  --  3.6 3.5 3.4* 3.7  CL  --  107 105 103 104  CO2  --  27 25 25 25   GLUCOSE  --  101* 92 88 91  BUN  --  21 18 17 16   CREATININE  --  0.95 0.92 0.91 0.89  CALCIUM  --  8.8* 9.4 9.4 9.8  MG 1.9  --   --   --  2.1  PHOS  --   --   --   --  2.7   Liver Function Tests: Recent Labs  Lab 06/08/21 0749 06/09/21 0357  AST 28 27  ALT 9 9  ALKPHOS 608* 636*  BILITOT 0.8 0.8  PROT 4.7* 4.8*  ALBUMIN 2.0* 2.2*   No results for input(s): LIPASE, AMYLASE in the last 168 hours. No results for input(s): AMMONIA in the last 168 hours. CBC: Recent Labs  Lab 06/07/21 0419 06/08/21 0749 06/09/21 0357  WBC 5.3 5.1 5.1  NEUTROABS  --  4.1 3.9  HGB 7.3* 7.6* 7.3*  HCT 23.3* 24.4* 23.8*  MCV 97.9 98.8 98.3  PLT 175 184 177   Cardiac Enzymes: No results for input(s): CKTOTAL, CKMB, CKMBINDEX, TROPONINI in the last 168 hours. BNP: BNP (last 3 results) No results for input(s): BNP in the last 8760 hours.  ProBNP (last 3 results) No results for input(s): PROBNP in the last 8760 hours.  CBG: No results for input(s): GLUCAP in the last 168 hours.     Signed:  Fayrene Helper MD.  [facility] 06/09/2021, 1:09 PM

## 2021-06-13 NOTE — Unmapped (Signed)
Patient Kevin Cohen was called in attempt number 2 to reach them regarding rescheduling their upcoming appointments on 11/17.   ??  The call resulted in: Voicemail full  ??  Please schedule the patient upon their return call on next avail for the following:   ??  Lab Appointment Dr Philomena Course and inj  ??  Thank you,  ??  Christina L Good

## 2021-06-19 NOTE — Unmapped (Signed)
Dr Philomena Course.   This pt no showed last appt. Called back to r.s but only wants televisit, please advise    TY  CG

## 2021-06-19 NOTE — Unmapped (Signed)
Patient??Kevin Cohen??was called in attempt number 3??to reach them regarding rescheduling??their upcoming appointments on 11/17.   ??  The call resulted in:??Voicemail full/sent letter  ??  Please??schedule??the patient upon their return call on next avail??for the following:   ??  Lab Appointment??Dr Philomena Course and inj  ??  Thank you,  ??  Christina L Good

## 2021-06-19 NOTE — Unmapped (Signed)
Hi,     Naomi with [facility] contacted the Communication Center regarding the following:    - States that she needs to speak with you to schedule a follow up with Dr. Philomena Course, states it is requested to be a televisit    Please contact Naomi at [phone].    Thanks in advance,    Rosary Lively  [facility]   [phone]

## 2021-06-20 NOTE — Unmapped (Signed)
I spoke with Janelle Floor to confirm appointments on the following date(s): 12/1    Christina L Good

## 2021-06-22 ENCOUNTER — Ambulatory Visit: Admit: 2021-06-22 | Payer: MEDICARE

## 2021-06-22 NOTE — Unmapped (Unsigned)
Call Patient prior to telephone visit. He answered the mobile device but appears to be very disoriented. He was unable to answer any of the question about his pain or medication. Victorino Dike the nurse at [facility] in [place] answered the call. She stated, the patient was cognitive impaired and was unable to have a telephone visit with Dr. Philomena Course. After finish the call with the nurse. I called his daughter, Gwendlyn Deutscher. She was very upset and verbally abusive.  She stated, she did not want to talk to Dr. Philomena Course, today.

## 2021-06-26 NOTE — Unmapped (Signed)
Specialty Medication(s): abiraterone + Prednisone    Mr.Spark has been dis-enrolled from the [facility] specialty pharmacy services due to medication discontinuation resulting from progression of disease. Per Victorino Dike, nurse at [facility] skilled nursing facility in [place], patient is hospice care    Additional information provided to the patient: NA    Chasmine Lender A [facility] Specialty Pharmacist

## 2021-07-05 ENCOUNTER — Encounter: Payer: Self-pay | Admitting: Oncology

## 2021-07-23 DEATH — deceased

## 2021-08-08 ENCOUNTER — Encounter: Payer: Self-pay | Admitting: Oncology

## 2021-08-09 DIAGNOSIS — C61 Malignant neoplasm of prostate: Principal | ICD-10-CM

## 2021-08-09 DIAGNOSIS — C7951 Secondary malignant neoplasm of bone: Principal | ICD-10-CM

## 2021-09-07 ENCOUNTER — Encounter: Payer: Self-pay | Admitting: Oncology
